# Patient Record
Sex: Female | Born: 1940 | Race: Black or African American | Hispanic: No | Smoking: Current every day smoker
Health system: Southern US, Community
[De-identification: ages and names within clinical notes are randomized; demographics above are authoritative.]

## PROBLEM LIST (undated history)

## (undated) DIAGNOSIS — I739 Peripheral vascular disease, unspecified: Secondary | ICD-10-CM

## (undated) DIAGNOSIS — I2510 Atherosclerotic heart disease of native coronary artery without angina pectoris: Secondary | ICD-10-CM

## (undated) DIAGNOSIS — E785 Hyperlipidemia, unspecified: Secondary | ICD-10-CM

## (undated) DIAGNOSIS — G459 Transient cerebral ischemic attack, unspecified: Secondary | ICD-10-CM

## (undated) DIAGNOSIS — I679 Cerebrovascular disease, unspecified: Secondary | ICD-10-CM

## (undated) DIAGNOSIS — I773 Arterial fibromuscular dysplasia: Secondary | ICD-10-CM

## (undated) DIAGNOSIS — B9681 Helicobacter pylori [H. pylori] as the cause of diseases classified elsewhere: Secondary | ICD-10-CM

## (undated) DIAGNOSIS — I442 Atrioventricular block, complete: Secondary | ICD-10-CM

## (undated) DIAGNOSIS — K589 Irritable bowel syndrome without diarrhea: Secondary | ICD-10-CM

## (undated) DIAGNOSIS — F0390 Unspecified dementia without behavioral disturbance: Secondary | ICD-10-CM

## (undated) DIAGNOSIS — K219 Gastro-esophageal reflux disease without esophagitis: Secondary | ICD-10-CM

## (undated) DIAGNOSIS — F17200 Nicotine dependence, unspecified, uncomplicated: Secondary | ICD-10-CM

## (undated) DIAGNOSIS — N183 Chronic kidney disease, stage 3 unspecified: Secondary | ICD-10-CM

## (undated) DIAGNOSIS — I6529 Occlusion and stenosis of unspecified carotid artery: Secondary | ICD-10-CM

## (undated) DIAGNOSIS — Z720 Tobacco use: Secondary | ICD-10-CM

## (undated) DIAGNOSIS — Z950 Presence of cardiac pacemaker: Secondary | ICD-10-CM

## (undated) DIAGNOSIS — I10 Essential (primary) hypertension: Secondary | ICD-10-CM

## (undated) DIAGNOSIS — J449 Chronic obstructive pulmonary disease, unspecified: Secondary | ICD-10-CM

## (undated) DIAGNOSIS — A0472 Enterocolitis due to Clostridium difficile, not specified as recurrent: Secondary | ICD-10-CM

## (undated) DIAGNOSIS — D649 Anemia, unspecified: Secondary | ICD-10-CM

## (undated) DIAGNOSIS — K279 Peptic ulcer, site unspecified, unspecified as acute or chronic, without hemorrhage or perforation: Secondary | ICD-10-CM

## (undated) DIAGNOSIS — K5790 Diverticulosis of intestine, part unspecified, without perforation or abscess without bleeding: Secondary | ICD-10-CM

## (undated) HISTORY — DX: Enterocolitis due to Clostridium difficile, not specified as recurrent: A04.72

## (undated) HISTORY — DX: Cerebrovascular disease, unspecified: I67.9

## (undated) HISTORY — DX: Atherosclerotic heart disease of native coronary artery without angina pectoris: I25.10

## (undated) HISTORY — DX: Tobacco use: Z72.0

## (undated) HISTORY — DX: Occlusion and stenosis of unspecified carotid artery: I65.29

## (undated) HISTORY — DX: Anemia, unspecified: D64.9

## (undated) HISTORY — DX: Chronic obstructive pulmonary disease, unspecified: J44.9

## (undated) HISTORY — PX: ABDOMINAL HYSTERECTOMY: SHX81

## (undated) HISTORY — DX: Hyperlipidemia, unspecified: E78.5

## (undated) HISTORY — DX: Essential (primary) hypertension: I10

## (undated) HISTORY — DX: Diverticulosis of intestine, part unspecified, without perforation or abscess without bleeding: K57.90

## (undated) HISTORY — DX: Chronic kidney disease, stage 3 unspecified: N18.30

## (undated) HISTORY — DX: Chronic kidney disease, stage 3 (moderate): N18.3

## (undated) HISTORY — DX: Arterial fibromuscular dysplasia: I77.3

## (undated) HISTORY — DX: Gastro-esophageal reflux disease without esophagitis: K21.9

## (undated) HISTORY — PX: CHOLECYSTECTOMY: SHX55

## (undated) HISTORY — DX: Nicotine dependence, unspecified, uncomplicated: F17.200

## (undated) HISTORY — DX: Peripheral vascular disease, unspecified: I73.9

## (undated) HISTORY — PX: OTHER SURGICAL HISTORY: SHX169

---

## 1977-06-05 HISTORY — PX: TOTAL ABDOMINAL HYSTERECTOMY W/ BILATERAL SALPINGOOPHORECTOMY: SHX83

## 2001-01-21 ENCOUNTER — Ambulatory Visit (HOSPITAL_COMMUNITY): Admission: RE | Admit: 2001-01-21 | Discharge: 2001-01-21 | Payer: Self-pay | Admitting: Family Medicine

## 2001-01-21 ENCOUNTER — Encounter: Payer: Self-pay | Admitting: Family Medicine

## 2001-03-22 ENCOUNTER — Encounter: Payer: Self-pay | Admitting: Family Medicine

## 2001-03-22 ENCOUNTER — Ambulatory Visit (HOSPITAL_COMMUNITY): Admission: RE | Admit: 2001-03-22 | Discharge: 2001-03-22 | Payer: Self-pay | Admitting: Family Medicine

## 2002-01-15 ENCOUNTER — Ambulatory Visit (HOSPITAL_COMMUNITY): Admission: RE | Admit: 2002-01-15 | Discharge: 2002-01-15 | Payer: Self-pay | Admitting: Family Medicine

## 2002-01-15 ENCOUNTER — Encounter: Payer: Self-pay | Admitting: Family Medicine

## 2002-01-28 ENCOUNTER — Encounter: Payer: Self-pay | Admitting: Urology

## 2002-01-28 ENCOUNTER — Ambulatory Visit (HOSPITAL_COMMUNITY): Admission: RE | Admit: 2002-01-28 | Discharge: 2002-01-28 | Payer: Self-pay | Admitting: Urology

## 2002-03-28 ENCOUNTER — Ambulatory Visit (HOSPITAL_COMMUNITY): Admission: RE | Admit: 2002-03-28 | Discharge: 2002-03-28 | Payer: Self-pay | Admitting: Family Medicine

## 2002-03-28 ENCOUNTER — Encounter: Payer: Self-pay | Admitting: Family Medicine

## 2002-05-04 ENCOUNTER — Encounter: Payer: Self-pay | Admitting: *Deleted

## 2002-05-04 ENCOUNTER — Emergency Department (HOSPITAL_COMMUNITY): Admission: EM | Admit: 2002-05-04 | Discharge: 2002-05-04 | Payer: Self-pay | Admitting: Emergency Medicine

## 2002-09-23 ENCOUNTER — Encounter: Payer: Self-pay | Admitting: Emergency Medicine

## 2002-09-23 ENCOUNTER — Inpatient Hospital Stay (HOSPITAL_COMMUNITY): Admission: EM | Admit: 2002-09-23 | Discharge: 2002-09-26 | Payer: Self-pay | Admitting: Emergency Medicine

## 2002-10-27 ENCOUNTER — Encounter (HOSPITAL_COMMUNITY): Admission: RE | Admit: 2002-10-27 | Discharge: 2002-11-26 | Payer: Self-pay | Admitting: Cardiology

## 2002-11-28 ENCOUNTER — Encounter (HOSPITAL_COMMUNITY): Admission: RE | Admit: 2002-11-28 | Discharge: 2002-12-28 | Payer: Self-pay | Admitting: Cardiology

## 2002-12-29 ENCOUNTER — Encounter (HOSPITAL_COMMUNITY): Admission: RE | Admit: 2002-12-29 | Discharge: 2003-01-28 | Payer: Self-pay | Admitting: Cardiology

## 2003-02-06 ENCOUNTER — Encounter (HOSPITAL_COMMUNITY): Admission: RE | Admit: 2003-02-06 | Discharge: 2003-03-08 | Payer: Self-pay | Admitting: Cardiology

## 2003-03-09 ENCOUNTER — Encounter (HOSPITAL_COMMUNITY): Admission: RE | Admit: 2003-03-09 | Discharge: 2003-04-08 | Payer: Self-pay | Admitting: Cardiology

## 2003-03-31 ENCOUNTER — Ambulatory Visit (HOSPITAL_COMMUNITY): Admission: RE | Admit: 2003-03-31 | Discharge: 2003-03-31 | Payer: Self-pay | Admitting: Family Medicine

## 2003-09-28 ENCOUNTER — Ambulatory Visit (HOSPITAL_COMMUNITY): Admission: RE | Admit: 2003-09-28 | Discharge: 2003-09-28 | Payer: Self-pay | Admitting: Cardiology

## 2004-04-05 ENCOUNTER — Ambulatory Visit (HOSPITAL_COMMUNITY): Admission: RE | Admit: 2004-04-05 | Discharge: 2004-04-05 | Payer: Self-pay | Admitting: Family Medicine

## 2004-07-11 ENCOUNTER — Ambulatory Visit (HOSPITAL_COMMUNITY): Admission: RE | Admit: 2004-07-11 | Discharge: 2004-07-11 | Payer: Self-pay | Admitting: Family Medicine

## 2004-08-05 ENCOUNTER — Ambulatory Visit (HOSPITAL_COMMUNITY): Admission: RE | Admit: 2004-08-05 | Discharge: 2004-08-05 | Payer: Self-pay | Admitting: General Surgery

## 2004-12-23 ENCOUNTER — Ambulatory Visit: Payer: Self-pay | Admitting: Cardiology

## 2005-03-03 ENCOUNTER — Ambulatory Visit (HOSPITAL_COMMUNITY): Admission: RE | Admit: 2005-03-03 | Discharge: 2005-03-03 | Payer: Self-pay | Admitting: Family Medicine

## 2005-04-06 ENCOUNTER — Ambulatory Visit (HOSPITAL_COMMUNITY): Admission: RE | Admit: 2005-04-06 | Discharge: 2005-04-06 | Payer: Self-pay | Admitting: Family Medicine

## 2005-11-28 ENCOUNTER — Ambulatory Visit (HOSPITAL_COMMUNITY): Admission: RE | Admit: 2005-11-28 | Discharge: 2005-11-28 | Payer: Self-pay | Admitting: Family Medicine

## 2005-12-29 ENCOUNTER — Ambulatory Visit: Payer: Self-pay | Admitting: Gastroenterology

## 2006-01-03 ENCOUNTER — Ambulatory Visit (HOSPITAL_COMMUNITY): Admission: RE | Admit: 2006-01-03 | Discharge: 2006-01-03 | Payer: Self-pay | Admitting: Gastroenterology

## 2006-01-03 ENCOUNTER — Encounter (INDEPENDENT_AMBULATORY_CARE_PROVIDER_SITE_OTHER): Payer: Self-pay | Admitting: Specialist

## 2006-01-03 ENCOUNTER — Ambulatory Visit: Payer: Self-pay | Admitting: Gastroenterology

## 2006-01-15 ENCOUNTER — Ambulatory Visit (HOSPITAL_COMMUNITY): Admission: RE | Admit: 2006-01-15 | Discharge: 2006-01-15 | Payer: Self-pay | Admitting: Internal Medicine

## 2006-03-03 ENCOUNTER — Encounter: Admission: RE | Admit: 2006-03-03 | Discharge: 2006-03-03 | Payer: Self-pay | Admitting: Family Medicine

## 2006-03-13 ENCOUNTER — Ambulatory Visit: Payer: Self-pay | Admitting: Gastroenterology

## 2006-04-09 ENCOUNTER — Ambulatory Visit (HOSPITAL_COMMUNITY): Admission: RE | Admit: 2006-04-09 | Discharge: 2006-04-09 | Payer: Self-pay | Admitting: Family Medicine

## 2006-06-08 ENCOUNTER — Ambulatory Visit: Payer: Self-pay | Admitting: Cardiology

## 2006-06-11 ENCOUNTER — Ambulatory Visit (HOSPITAL_COMMUNITY): Admission: RE | Admit: 2006-06-11 | Discharge: 2006-06-11 | Payer: Self-pay | Admitting: Cardiology

## 2006-06-25 ENCOUNTER — Ambulatory Visit: Payer: Self-pay | Admitting: Gastroenterology

## 2006-07-26 ENCOUNTER — Ambulatory Visit: Payer: Self-pay | Admitting: Gastroenterology

## 2006-08-08 ENCOUNTER — Ambulatory Visit: Payer: Self-pay | Admitting: Cardiology

## 2006-09-30 ENCOUNTER — Emergency Department (HOSPITAL_COMMUNITY): Admission: EM | Admit: 2006-09-30 | Discharge: 2006-09-30 | Payer: Self-pay | Admitting: Emergency Medicine

## 2006-11-30 ENCOUNTER — Ambulatory Visit: Payer: Self-pay | Admitting: Cardiology

## 2007-01-24 ENCOUNTER — Ambulatory Visit (HOSPITAL_COMMUNITY): Admission: RE | Admit: 2007-01-24 | Discharge: 2007-01-24 | Payer: Self-pay | Admitting: Family Medicine

## 2007-02-12 ENCOUNTER — Ambulatory Visit: Payer: Self-pay | Admitting: Vascular Surgery

## 2007-04-23 ENCOUNTER — Ambulatory Visit (HOSPITAL_COMMUNITY): Admission: RE | Admit: 2007-04-23 | Discharge: 2007-04-23 | Payer: Self-pay | Admitting: Family Medicine

## 2007-04-24 ENCOUNTER — Ambulatory Visit: Payer: Self-pay | Admitting: Cardiology

## 2007-05-01 ENCOUNTER — Ambulatory Visit (HOSPITAL_COMMUNITY): Admission: RE | Admit: 2007-05-01 | Discharge: 2007-05-01 | Payer: Self-pay | Admitting: Cardiology

## 2007-06-06 HISTORY — PX: COLONOSCOPY: SHX174

## 2007-09-20 ENCOUNTER — Ambulatory Visit (HOSPITAL_COMMUNITY): Admission: RE | Admit: 2007-09-20 | Discharge: 2007-09-20 | Payer: Self-pay | Admitting: Family Medicine

## 2007-09-25 ENCOUNTER — Ambulatory Visit (HOSPITAL_COMMUNITY): Admission: RE | Admit: 2007-09-25 | Discharge: 2007-09-25 | Payer: Self-pay | Admitting: Family Medicine

## 2007-12-17 ENCOUNTER — Ambulatory Visit: Payer: Self-pay | Admitting: Vascular Surgery

## 2008-01-03 ENCOUNTER — Ambulatory Visit: Payer: Self-pay | Admitting: Cardiology

## 2008-05-05 ENCOUNTER — Ambulatory Visit (HOSPITAL_COMMUNITY): Admission: RE | Admit: 2008-05-05 | Discharge: 2008-05-05 | Payer: Self-pay | Admitting: Family Medicine

## 2008-07-14 ENCOUNTER — Ambulatory Visit: Payer: Self-pay | Admitting: Vascular Surgery

## 2008-08-11 ENCOUNTER — Encounter: Payer: Self-pay | Admitting: Cardiology

## 2008-08-11 LAB — CONVERTED CEMR LAB
Albumin: 3.7 g/dL
BUN: 20 mg/dL
Chloride: 106 meq/L
Creatinine, Ser: 0.92 mg/dL
HDL: 36 mg/dL
LDL Cholesterol: 71 mg/dL
Potassium: 4.2 meq/L
Total Bilirubin: 0.3 mg/dL
Total Protein: 7 g/dL

## 2008-10-19 ENCOUNTER — Ambulatory Visit (HOSPITAL_COMMUNITY): Admission: RE | Admit: 2008-10-19 | Discharge: 2008-10-19 | Payer: Self-pay | Admitting: Family Medicine

## 2008-10-26 DIAGNOSIS — E119 Type 2 diabetes mellitus without complications: Secondary | ICD-10-CM

## 2008-10-26 DIAGNOSIS — I10 Essential (primary) hypertension: Secondary | ICD-10-CM

## 2008-10-26 DIAGNOSIS — K219 Gastro-esophageal reflux disease without esophagitis: Secondary | ICD-10-CM | POA: Insufficient documentation

## 2008-10-26 DIAGNOSIS — K5730 Diverticulosis of large intestine without perforation or abscess without bleeding: Secondary | ICD-10-CM | POA: Insufficient documentation

## 2008-10-26 DIAGNOSIS — M109 Gout, unspecified: Secondary | ICD-10-CM

## 2008-10-26 DIAGNOSIS — K819 Cholecystitis, unspecified: Secondary | ICD-10-CM

## 2008-10-26 DIAGNOSIS — J45909 Unspecified asthma, uncomplicated: Secondary | ICD-10-CM

## 2008-10-26 DIAGNOSIS — E785 Hyperlipidemia, unspecified: Secondary | ICD-10-CM

## 2008-10-26 DIAGNOSIS — E1140 Type 2 diabetes mellitus with diabetic neuropathy, unspecified: Secondary | ICD-10-CM | POA: Insufficient documentation

## 2008-10-27 ENCOUNTER — Encounter: Payer: Self-pay | Admitting: Cardiology

## 2008-10-27 ENCOUNTER — Ambulatory Visit: Payer: Self-pay | Admitting: Cardiology

## 2008-10-27 ENCOUNTER — Ambulatory Visit (HOSPITAL_COMMUNITY): Admission: RE | Admit: 2008-10-27 | Discharge: 2008-10-27 | Payer: Self-pay | Admitting: Family Medicine

## 2009-01-19 ENCOUNTER — Ambulatory Visit: Payer: Self-pay | Admitting: Vascular Surgery

## 2009-03-04 ENCOUNTER — Ambulatory Visit (HOSPITAL_COMMUNITY): Admission: RE | Admit: 2009-03-04 | Discharge: 2009-03-04 | Payer: Self-pay | Admitting: Family Medicine

## 2009-05-10 ENCOUNTER — Ambulatory Visit (HOSPITAL_COMMUNITY): Admission: RE | Admit: 2009-05-10 | Discharge: 2009-05-10 | Payer: Self-pay | Admitting: Family Medicine

## 2009-06-08 ENCOUNTER — Telehealth (INDEPENDENT_AMBULATORY_CARE_PROVIDER_SITE_OTHER): Payer: Self-pay | Admitting: *Deleted

## 2009-08-09 LAB — CONVERTED CEMR LAB
Alkaline Phosphatase: 72 units/L
BUN: 20 mg/dL
Bilirubin, Direct: <0.1 mg/dL
CO2: 29 meq/L
Cholesterol: 151 mg/dL
Creatinine, Ser: 0.9 mg/dL
Glucose, Bld: 105 mg/dL
Hgb A1c MFr Bld: 6.7 %
Total Protein: 7.3 g/dL
Triglycerides: 179 mg/dL

## 2009-08-16 ENCOUNTER — Encounter: Payer: Self-pay | Admitting: Cardiology

## 2009-08-16 DIAGNOSIS — I679 Cerebrovascular disease, unspecified: Secondary | ICD-10-CM

## 2009-08-16 DIAGNOSIS — I739 Peripheral vascular disease, unspecified: Secondary | ICD-10-CM

## 2009-08-16 DIAGNOSIS — F17200 Nicotine dependence, unspecified, uncomplicated: Secondary | ICD-10-CM | POA: Insufficient documentation

## 2009-08-16 DIAGNOSIS — I2510 Atherosclerotic heart disease of native coronary artery without angina pectoris: Secondary | ICD-10-CM

## 2009-08-17 ENCOUNTER — Encounter (INDEPENDENT_AMBULATORY_CARE_PROVIDER_SITE_OTHER): Payer: Self-pay | Admitting: *Deleted

## 2009-08-17 ENCOUNTER — Ambulatory Visit: Payer: Self-pay | Admitting: Cardiology

## 2009-09-10 ENCOUNTER — Telehealth: Payer: Self-pay | Admitting: Cardiology

## 2009-09-10 ENCOUNTER — Encounter: Payer: Self-pay | Admitting: Cardiology

## 2009-09-14 ENCOUNTER — Encounter (INDEPENDENT_AMBULATORY_CARE_PROVIDER_SITE_OTHER): Payer: Self-pay | Admitting: *Deleted

## 2009-09-14 LAB — CONVERTED CEMR LAB
BUN: 49 mg/dL — ABNORMAL HIGH (ref 6–23)
CO2: 25 meq/L (ref 19–32)
Chloride: 107 meq/L (ref 96–112)
Potassium: 5.2 meq/L (ref 3.5–5.3)

## 2009-11-08 ENCOUNTER — Encounter (INDEPENDENT_AMBULATORY_CARE_PROVIDER_SITE_OTHER): Payer: Self-pay

## 2009-11-08 ENCOUNTER — Ambulatory Visit (HOSPITAL_COMMUNITY): Admission: RE | Admit: 2009-11-08 | Discharge: 2009-11-08 | Payer: Self-pay | Admitting: Family Medicine

## 2009-11-08 LAB — CONVERTED CEMR LAB
CO2: 25 meq/L
Calcium: 9.2 mg/dL
Hemoglobin: 10.6 g/dL
Platelets: 295 10*3/uL
Potassium: 5.2 meq/L
Sodium: 139 meq/L

## 2009-11-15 ENCOUNTER — Encounter (INDEPENDENT_AMBULATORY_CARE_PROVIDER_SITE_OTHER): Payer: Self-pay

## 2009-11-15 ENCOUNTER — Encounter (INDEPENDENT_AMBULATORY_CARE_PROVIDER_SITE_OTHER): Payer: Self-pay | Admitting: *Deleted

## 2009-11-15 LAB — CONVERTED CEMR LAB
BUN: 43 mg/dL
CO2: 26 meq/L
Chloride: 104 meq/L
Creatinine, Ser: 1.78 mg/dL

## 2009-11-28 LAB — CONVERTED CEMR LAB
CO2: 26 meq/L (ref 19–32)
Chloride: 104 meq/L (ref 96–112)
Creatinine, Ser: 1.78 mg/dL — ABNORMAL HIGH (ref 0.40–1.20)
Glucose, Bld: 56 mg/dL — ABNORMAL LOW (ref 70–99)
Sodium: 140 meq/L (ref 135–145)

## 2009-11-30 ENCOUNTER — Encounter (INDEPENDENT_AMBULATORY_CARE_PROVIDER_SITE_OTHER): Payer: Self-pay | Admitting: *Deleted

## 2009-12-07 ENCOUNTER — Telehealth (INDEPENDENT_AMBULATORY_CARE_PROVIDER_SITE_OTHER): Payer: Self-pay | Admitting: *Deleted

## 2009-12-09 ENCOUNTER — Emergency Department (HOSPITAL_COMMUNITY): Admission: EM | Admit: 2009-12-09 | Discharge: 2009-12-09 | Payer: Self-pay | Admitting: Emergency Medicine

## 2009-12-10 ENCOUNTER — Telehealth (INDEPENDENT_AMBULATORY_CARE_PROVIDER_SITE_OTHER): Payer: Self-pay | Admitting: *Deleted

## 2009-12-13 ENCOUNTER — Encounter (INDEPENDENT_AMBULATORY_CARE_PROVIDER_SITE_OTHER): Payer: Self-pay | Admitting: *Deleted

## 2009-12-14 ENCOUNTER — Ambulatory Visit: Payer: Self-pay | Admitting: Cardiology

## 2009-12-14 DIAGNOSIS — E875 Hyperkalemia: Secondary | ICD-10-CM

## 2009-12-16 ENCOUNTER — Ambulatory Visit (HOSPITAL_COMMUNITY): Admission: RE | Admit: 2009-12-16 | Discharge: 2009-12-16 | Payer: Self-pay | Admitting: Cardiology

## 2009-12-20 ENCOUNTER — Telehealth (INDEPENDENT_AMBULATORY_CARE_PROVIDER_SITE_OTHER): Payer: Self-pay | Admitting: *Deleted

## 2010-01-05 LAB — CONVERTED CEMR LAB
CO2: 22 meq/L
Glucose, Bld: 63 mg/dL
Potassium: 3.9 meq/L
Sodium: 140 meq/L

## 2010-01-14 ENCOUNTER — Ambulatory Visit: Payer: Self-pay | Admitting: Cardiology

## 2010-02-09 ENCOUNTER — Encounter: Payer: Self-pay | Admitting: Cardiology

## 2010-02-09 ENCOUNTER — Ambulatory Visit: Payer: Self-pay | Admitting: Vascular Surgery

## 2010-02-14 ENCOUNTER — Ambulatory Visit: Payer: Self-pay | Admitting: Cardiology

## 2010-03-02 LAB — CONVERTED CEMR LAB
BUN: 11 mg/dL (ref 6–23)
CO2: 26 meq/L (ref 19–32)
Calcium: 9.2 mg/dL (ref 8.4–10.5)
Creatinine, Ser: 0.91 mg/dL (ref 0.40–1.20)
Glucose, Bld: 83 mg/dL (ref 70–99)

## 2010-03-07 ENCOUNTER — Encounter (INDEPENDENT_AMBULATORY_CARE_PROVIDER_SITE_OTHER): Payer: Self-pay | Admitting: *Deleted

## 2010-04-20 ENCOUNTER — Ambulatory Visit (HOSPITAL_COMMUNITY): Admission: RE | Admit: 2010-04-20 | Discharge: 2010-04-20 | Payer: Self-pay | Admitting: Family Medicine

## 2010-06-05 DIAGNOSIS — G459 Transient cerebral ischemic attack, unspecified: Secondary | ICD-10-CM

## 2010-06-05 HISTORY — DX: Transient cerebral ischemic attack, unspecified: G45.9

## 2010-07-05 NOTE — Letter (Signed)
Summary: [name] [facility] Work [facility], agricultural at [employer]  [address], [place] [address]   Phone: [phone]  Fax: [phone]     September 14, 2009 MRN: [id number]   Charlotte Henry [address] [place], [place]  [address]      YOUR LAB WORK IS DUE  November 15, 2009 _________________________________________  Please go to [facility], located across the street from [facility] on the second floor.  Hours are Monday - Friday 7am until 7:30pm         Saturday 8am until 12noon    __  DO NOT EAT OR DRINK AFTER MIDNIGHT EVENING PRIOR TO LABWORK  _x_ YOUR LABWORK IS NOT FASTING --YOU MAY EAT PRIOR TO LABWORK

## 2010-07-05 NOTE — Letter (Signed)
Summary: [place] Results Engineer, agricultural at [facility]  [address] [place] [address]   Phone: [phone]  Fax: [phone]      September 14, 2009 MRN: [id number]   Charlotte Henry [address] [place], [place]  [address]   Dear Ms. Samples,  Your test ordered by Selena Batten has been reviewed by your physician (or physician assistant) and was found to be normal or stable. Your physician (or physician assistant) felt no changes were needed at this time.  ____ Echocardiogram  ____ Cardiac Stress Test  _x___ Lab Work  ____ Peripheral vascular study of arms, legs or neck  ____ CT scan or X-ray  ____ Lung or Breathing test  ____ Other:  Please decrease  the amount of potassium in your diet.  Enclosed is a copy of your labwork for your records.  Thank you, Aricela Bertagnolli Allyne Gee RN    Slick Bing, MD, Lenise Arena.C.Gaylord Shih, MD, F.A.C.C Lewayne Bunting, MD, F.A.C.C Nona Dell, MD, F.A.C.C Charlton Haws, MD, Lenise Arena.C.C

## 2010-07-05 NOTE — Progress Notes (Signed)
Summary: "JUST HAVE Charlotte Henry CALL ME "  Phone Note Call from Patient Call back at Home Phone [phone]   Caller: PT Reason for Call: Talk to Nurse Summary of Call: PT WOULD LIKE [name] TO CALL HER, DID NOT GIVE REASON WHY. Initial call taken by: Faythe Ghee,  December 07, 2009 11:18 AM  Follow-up for Phone Call        [facility] Follow-up by: Teressa Lower RN,  December 07, 2009 1:08 PM  Additional Follow-up for Phone Call Additional follow up Details #1::        pt received intormation about a renal diet in the mail, very concerned about her kidney function.  Researched  renal diet better and gathered more information per pt request.  Pt to come to office in am to get this information. Additional Follow-up by: Teressa Lower RN,  December 07, 2009 5:13 PM

## 2010-07-05 NOTE — Letter (Signed)
Summary: [place] [facility] Work Engineer, agricultural at [employer]  [address] [place] [address]   Phone: [phone]  Fax: [phone]     August 17, 2009 MRN: [id number]   Charlotte Henry [address] [place], [place]  [address]      YOUR LAB WORK IS DUE   _______________MAY 15, 2011__________________________  Please go to [facility], located across the street from [facility] on the second floor.  Hours are Monday - Friday 7am until 7:30pm         Saturday 8am until 12noon    __  DO NOT EAT OR DRINK AFTER MIDNIGHT EVENING PRIOR TO LABWORK  _X_ YOUR LABWORK IS NOT FASTING --YOU MAY EAT PRIOR TO LABWORK

## 2010-07-05 NOTE — Progress Notes (Signed)
Summary: BP concerns  Phone Note Call from Patient   Caller: Patient Reason for Call: Talk to Nurse Summary of Call: pt states that BP dropped last night to 75/54 in left arm and right arm was 74/45 @ 7pm/in left arm @ 8:45 it was 104/58 and right arm was 94/58/ in left arm @ 10:30 it was 127/61 and right arm was 116/67/pt states that Diovan was increased @ last visit/pls return phone call/tg Initial call taken by: Raechel Ache [facility],  September 10, 2009 8:05 AM  Follow-up for Phone Call        S:pt's bp low 09/09/2009 B: pt seen 08/17/2009,  diovan changed to diovan/hct320/12.5 once daily and zyban 150mg   daily  A: pt had an episode 09/04/09, weakness and felt like her bp dropped but didn't check her pressure.  Had the same kind of episode 09/09/2009, bp's stated above. R: pt was in the bed when I called, asked her to call with her bp when she gets up 134/70 l arm 137/77 r arm pt  plans to take her diovan 160mg  daily until she hears from [place]. sent pt to lab for bmp 09/10/2009 Follow-up by: Teressa Lower RN,  September 10, 2009 9:37 AM  Additional Follow-up for Phone Call Additional follow up Details #1::        Change Diovan/HCT to 160/12.5 q.d. Additional Follow-up by: Kathlen Brunswick, MD, [facility],  September 14, 2009 8:59 AM    New/Updated Medications: DIOVAN HCT 160-12.5 MG TABS (VALSARTAN-HYDROCHLOROTHIAZIDE) Take 1 tablet by mouth once a day Prescriptions: DIOVAN HCT 160-12.5 MG TABS (VALSARTAN-HYDROCHLOROTHIAZIDE) Take 1 tablet by mouth once a day  #30 x 3   Entered by:   Teressa Lower RN   Authorized by:   Kathlen Brunswick, MD, [facility]   Signed by:   Teressa Lower RN on 09/14/2009   Method used:   Electronically to        [facility] (retail)       [address]       [place], [place]  [address]       Ph: [phone]       Fax: [phone]   RxID:   [id number]

## 2010-07-05 NOTE — Assessment & Plan Note (Signed)
Summary: 10 mth f/u per checkout on 10/27/08/tg   Visit Type:  Follow-up Referring Provider:  [facility]; [facility] Primary Provider:  Dr. Mirna Mires   History of Present Illness: Return visit for this very pleasant 70 year old woman with coronary disease and multiple cardiovascular risk factors.  Symptomatically, she is most concerned about pain in her right knee and leg.  She attributes this to arthritis and vascular disease, but wonders whether treatment with atorvastatin could be contributing.  She's had no chest discomfort or dyspnea.  She notes no orthopnea nor PND.  She describes no pedal edema.     Current Medications (verified): 1)  Diovan Hct 320-12.5 Mg Tabs (Valsartan-Hydrochlorothiazide) 2)  Lipitor 40 Mg Tabs (Atorvastatin Calcium) .... Take 1 Tablet By Mouth Once A Day 3)  Nexium 40 Mg Cpdr (Esomeprazole Magnesium) .... Take 1 Capsule By Mouth Once A Day 4)  Amaryl 4 Mg Tabs (Glimepiride) .... Take 1 Tablet By Mouth Once A Day 5)  Neurontin 300 Mg Caps (Gabapentin) .... Take 1 Tab Two Times A Day 6)  Calcium Plus Vitamin D 300-100 Mg-Unit Caps (Calcium Carbonate-Vitamin D) .... Take 1 Tablet By Mouth Once A Day 7)  Aspirin Ec 81 Mg Tbec (Aspirin) .... Take 1 Tablet By Mouth Once A Day 8)  Tylenol Ex St Arthritis Pain 500 Mg Tabs (Acetaminophen) .... As Needed 9)  Zyban 150 Mg Xr12h-Tab (Bupropion Hcl (Smoking Deter)) .... Take 1 Tablet By Mouth Once A Day  Allergies (verified): 1)  ! Chantix (Varenicline Tartrate) 2)  ! Aspirin 3)  ! Ace Inhibitors  Past History:  PMH, FH, and Social History reviewed and updated.  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 aortic inflow disease CHOLECYSTITIS (ICD-575.10) Anemia GOUT, UNSPECIFIED (ICD-274.9) GERD (ICD-530.81) DIVERTICULOSIS OF COLON (ICD-562.10) ASTHMA,/chronic bronchitis Tobacco abuse: 50 pack years continuing at 1/2 pack per day  Review of Systems  The patient denies anorexia, fever, weight loss, weight gain, vision loss, decreased hearing, hoarseness, chest pain, syncope, dyspnea on exertion, peripheral edema, prolonged cough, headaches, hemoptysis, abdominal pain, melena, and hematochezia.    Vital Signs:  Patient profile:   70 year old female Weight:      190 pounds BMI:     29.87 Pulse rate:   60 / minute BP sitting:   142 / 75  (right arm)  Vitals Entered By: Dreama Saa, CNA (August 17, 2009 12:51 PM)  Physical Exam  General:   Somewhat overweight, pleasant, and   matter-of-fact woman in no acute distress. Weight is 189, stable. NECK:  No jugular venous distention; no carotid bruits. CARDIAC:  Distant first and second heart sounds; modest systolic ejection murmur. ABDOMEN:  Soft and nontender; normal bowel sounds; no organomegaly. LUNGS:  Minimal inspiratory rhonchi; coarse breath sounds at bases EXTREMITIES:  No edema; distal pulses were decreased, more prominently on the right; where they were  barely palpable   Impression & Recommendations:  Problem # 1:  TOBACCO ABUSE (ICD-305.1) Patient discontinued Chantix because of adverse GI effects, but is willing to make another quit attempt.  I will prescribe Zyban.  Details of quit attempt were discussed, and she was encouraged to persist in this attempt.  Problem #  2:  ATHEROSCLEROTIC CARDIOVASCULAR DISEASE (ICD-429.2) No symptoms to suggest active myocardial ischemia.  Current therapy will be continued.  Problem # 3:  HYPERTENSION (ICD-401.9) Blood pressure control is marginal with systolics generally around 140.  Diovan will be changed to Diovan HCT and the dosage of Diovan increased.  A  chemistry profile will be obtained in one month.  Problem # 4:  HYPERLIPIDEMIA (ICD-272.4) Lipid profile was excellent when last assessed with total cholesterol 129, HDL 36, LDL of 71 and triglycerides of 112.  Unfortunately, the patient wonders whether Lipitor is contributing to pain in her legs, primarily her right leg.  She will discontinue this medication for one month.  If symptoms are improved, she will call for alternative therapy.  If not, she will resume at her current dosage.  I will plan to see this nice woman again in 8 months.  Other Orders: Future Orders: T-Basic Metabolic Panel [id number]) ... 09/17/2009  Patient Instructions: 1)  Your physician recommends that you schedule a follow-up appointment in: 8 months 2)  Your physician recommends that you return for lab work in: 1 month 3)  Your physician has recommended you make the following change in your medication: zyban 150mg  daily for 3 months and stop smoking 4)  change diovan to diovan/hct 320/12.5 daily 5)  stop lipitor x 1 month if leg pain better call office if not resume lipitor and call office. 6)  Your physician discussed the hazards of tobacco use.  Tobacco use cessation is recommended and techniques and options to help you quit were discussed. Prescriptions: ZYBAN 150 MG XR12H-TAB (BUPROPION HCL (SMOKING DETER)) Take 1 tablet by mouth once a day  #30 x 3   Entered by:   Teressa Lower RN   Authorized by:   Kathlen Brunswick, MD, [facility]   Signed by:   Teressa Lower RN on 08/17/2009   Method used:   Electronically to        [facility] (retail)       [address]       [place], [place]  [address]       Ph: [phone]       Fax: [phone]   RxID:   [id number]

## 2010-07-05 NOTE — Assessment & Plan Note (Signed)
Summary: 1 month followup/rm  Nurse Visit   Vital Signs:  Patient profile:   70 year old female Weight:      183 pounds O2 Sat:      96 % on Room air Temp:     98.6 degrees F oral Pulse rate:   66 / minute BP sitting:   119 / 63  (left arm)  Vitals Entered ByLarita Fife Via LPN (January 14, 2010 9:03 AM)  O2 Flow:  Room air  Current Medications (verified): 1)  Diovan 320 Mg Tabs (Valsartan) .... Take 1 Tablet By Mouth Once A Day 2)  Nexium 40 Mg Cpdr (Esomeprazole Magnesium) .... Take 1 Capsule By Mouth Once A Day 3)  Amaryl 4 Mg Tabs (Glimepiride) .... Take 1 Tab Daily 4)  Neurontin 300 Mg Caps (Gabapentin) .... Take 1 Tab Two Times A Day 5)  Calcium Plus Vitamin D 300-100 Mg-Unit Caps (Calcium Carbonate-Vitamin D) .... Take 1 Tablet By Mouth Once A Day 6)  Aspirin Ec 81 Mg Tbec (Aspirin) .... Take 1 Tablet By Mouth Once A Day 7)  Tylenol Ex St Arthritis Pain 500 Mg Tabs (Acetaminophen) .... As Needed 8)  Cranberry 405 Mg Caps (Cranberry) .... Take 1 Tab Daily 9)  Vitamin B-12 1000 Mcg Tabs (Cyanocobalamin) .... Take 1 Tab Daily  Allergies (verified): 1)  ! Chantix (Varenicline Tartrate) 2)  ! Aspirin 3)  ! Ace Inhibitors  Visit Type:  BP check/Nurse visit Referring Provider:  [facility]; [facility] Primary Provider:  Dr. Mirna Mires   History of Present Illness: S: Pt. arrives in office for BP check/nurse visit. B: On last OV of 12-14-09, pt. was advised to stop taking Diovan/HCT due to mild renal insufficiency and development of orthostatic hypotention, and to increase Diovan to 320mg  once daily. A: Pt. brought in medications and BP diary. No SBP > 158 or < 96, no DBP > 78 or < 58. BP at this visit is 119/63. Pt. has no complaints at this time.  R: Pt. advised to continue monitoring BP's and current medications.    01/16/2010  Needs lying and standing BP Continue current meds BMet was due on 8/12--please obtain.  [place] Bing, M.D.   Labs from [facility] on 01-05-10,  including BMET, have been entered in chart. Pt. has f/u appt. on 02-14-10. Pt. is advised of recommendations, she states she understands.

## 2010-07-05 NOTE — Progress Notes (Signed)
Summary: rx questions  Phone Note Call from Patient Call back at Home Phone [phone]   Caller: pt Reason for Call: Refill Medication Summary of Call: patient would like to speak with Faith Branan about medications Initial call taken by: Faythe Ghee,  June 08, 2009 12:03 PM  Follow-up for Phone Call        Rx Called In Follow-up by: Teressa Lower RN,  June 08, 2009 1:32 PM

## 2010-07-05 NOTE — Letter (Signed)
Summary: [place] Results Engineer, agricultural at [facility]  [address] [place] [address]   Phone: [phone]  Fax: [phone]      November 30, 2009 MRN: [id number]   Charlotte Henry [address] [place], [place]  [address]   Dear [name],  Your test ordered by Selena Batten has been reviewed by your physician (or physician assistant) and was found to be normal or stable. Your physician (or physician assistant) felt no changes were needed at this time.  ____ Echocardiogram  ____ Cardiac Stress Test  __X__ Lab Work  ____ Peripheral vascular study of arms, legs or neck  ____ CT scan or X-ray  ____ Lung or Breathing test  ____ Other:  Please follow  a renal diet with emphasis on potassium restriction Labwork  in 1 and 3 months, per Dr. Dietrich Pates. Thank you, Tammy Allyne Gee RN    [place] Bing, MD, Lenise Arena.C.Gaylord Shih, MD, F.A.C.C Lewayne Bunting, MD, F.A.C.C Nona Dell, MD, F.A.C.C Charlton Haws, MD, Lenise Arena.C.C   Appended Document: Orders Update    Clinical Lists Changes  Orders: Added new Test order of T-Basic Metabolic Panel [phone]) - Signed Added new Test order of T-Basic Metabolic Panel [phone]) - Signed  I spoke with pt gave new orders and pt verbalized understanding   Teressa Lower RN  November 30, 2009 2:48 PM

## 2010-07-05 NOTE — Miscellaneous (Signed)
Summary: labs bmp,11/15/2009  Clinical Lists Changes  Observations: Added new observation of CALCIUM: 9.4 mg/dL (62/13/0865 78:46) Added new observation of CREATININE: 1.78 mg/dL (96/29/5284 13:24) Added new observation of BUN: 43 mg/dL (40/03/2724 36:64) Added new observation of BG RANDOM: 56 mg/dL (40/34/7425 95:63) Added new observation of CO2 PLSM/SER: 26 meq/L (11/15/2009 17:14) Added new observation of CL SERUM: 104 meq/L (11/15/2009 17:14) Added new observation of K SERUM: 5.1 meq/L (11/15/2009 17:14) Added new observation of NA: 140 meq/L (11/15/2009 17:14)

## 2010-07-05 NOTE — Progress Notes (Signed)
Summary: [facility] - Office Visit  [facility] - Office Visit   Imported By: Marylou Mccoy 03/07/2010 15:22:07  _____________________________________________________________________  External Attachment:    Type:   Image     Comment:   External Document

## 2010-07-05 NOTE — Miscellaneous (Signed)
**Note De-Identified  Obfuscation** Summary: CBC and BMET  11-08-09  Clinical Lists Changes  Observations: Added new observation of CALCIUM: 9.2 mg/dL (09/81/1914 78:29) Added new observation of CREATININE: 1.40 mg/dL (56/21/3086 57:84) Added new observation of BUN: 26 mg/dL (69/62/9528 41:32) Added new observation of BG RANDOM: 53 mg/dL (44/06/270 53:66) Added new observation of CO2 PLSM/SER: 25 meq/L (11/08/2009 15:50) Added new observation of CL SERUM: 105 meq/L (11/08/2009 15:50) Added new observation of K SERUM: 5.2 meq/L (11/08/2009 15:50) Added new observation of NA: 139 meq/L (11/08/2009 15:50) Added new observation of PLATELETK/UL: 295 K/uL (11/08/2009 15:50) Added new observation of MCV: 90.9 fL (11/08/2009 15:50) Added new observation of HCT: 31.9 % (11/08/2009 15:50) Added new observation of HGB: 10.6 g/dL (44/08/4740 59:56) Added new observation of WBC COUNT: 9.7 10*3/microliter (11/08/2009 15:50)

## 2010-07-05 NOTE — Assessment & Plan Note (Signed)
Summary: per Tammy/ pt having ministrokes/tg   Visit Type:  Follow-up Referring Provider:  GI-Fields; [facility] Primary Provider:  Dr. Mirna Mires   History of Present Illness: Charlotte Henry returns to the office at the request of our emergency department after presenting there with gait instability.  She had initially been evaluated in Urgent Care where orthostatic hypotension was noted prompting referral to the Emergency Department where her vital signs were normal.  She was given intravenous hydration, and symptoms resolved.  CT Scan of the head showed small lacunar infarcts, probably old.  Laboratory in the emergency department showed anemia with a hemoglobin of 10.3 and a hematocrit of 31.  MCV was normal.  Chemistry profile was normal except for BUN of 43 and a creatinine of 1.52.  She reports no carotid ultrasound since a vascular evaluation by the surgeons in [place] some years ago.  She has had no recurrence of symptoms since her ED visit.  She reports no neurologic problems and no orthostatic dizziness.  She has not experienced syncope.  She denies chest discomfort, exertional dyspnea and edema.  Preventive Screening-Counseling & Management  Alcohol-Tobacco     Smoking Cessation Counseling: yes  Current Medications (verified): 1)  Diovan 320 Mg Tabs (Valsartan) .... Take 1 Tablet By Mouth Once A Day 2)  Nexium 40 Mg Cpdr (Esomeprazole Magnesium) .... Take 1 Capsule By Mouth Once A Day 3)  Amaryl 4 Mg Tabs (Glimepiride) .... Take 1 Tab Daily 4)  Neurontin 300 Mg Caps (Gabapentin) .... Take 1 Tab Two Times A Day 5)  Calcium Plus Vitamin D 300-100 Mg-Unit Caps (Calcium Carbonate-Vitamin D) .... Take 1 Tablet By Mouth Once A Day 6)  Aspirin Ec 81 Mg Tbec (Aspirin) .... Take 1 Tablet By Mouth Once A Day 7)  Tylenol Ex St Arthritis Pain 500 Mg Tabs (Acetaminophen) .... As Needed 8)  Cranberry 405 Mg Caps (Cranberry) .... Take 1 Tab Daily 9)  Vitamin B-12 1000 Mcg Tabs  (Cyanocobalamin) .... Take 1 Tab Daily  Allergies (verified): 1)  ! Chantix (Varenicline Tartrate) 2)  ! Aspirin 3)  ! Ace Inhibitors  Past History:  PMH, FH, and Social History reviewed and updated.  Past Medical History: ASCVD: Negative stress nuclear in 3/99; cath in 4/04-80% LAD, 70% RCA, both treated with DES;       residual 60% distal RCA; normal ejection fraction HYPERLIPIDEMIA (ICD-272.4) HYPERTENSION (ICD-401.9) Chronic kidney disease-stage III; creatinine of 1.78 in 11/2009 Hyperkalemia-potassium of 5.1-5.3 in 2011 AODM (ICD-250.00)-treated with oral agents; hemoglobin A1c of 7 and 3/10 PVD/CVD-50% LICA in 2000 and; moderate to severely decreased ABIs in 8/08; sig. aortic inflow disease CHOLECYSTITIS (ICD-575.10) Anemia GOUT, UNSPECIFIED (ICD-274.9) GERD (ICD-530.81) DIVERTICULOSIS OF COLON (ICD-562.10) ASTHMA,/chronic bronchitis Tobacco abuse: 50 pack years continuing at 1/2 pack per day   Review of Systems       See history of present illness.  Vital Signs:  Patient profile:   70 year old female Weight:      187 pounds Pulse rate:   60 / minute BP sitting:   108 / 69  (right arm)  Vitals Entered By: Charlotte Saa, CNA (December 14, 2009 10:44 AM)  Physical Exam  General:   Somewhat overweight, pleasant,  in no acute distress. Weight is 187, stable. NECK:  No jugular venous distention; questionable minimal left carotid bruits. CARDIAC: normal first and second heart sounds; modest systolic ejection murmur. ABDOMEN:  Soft and nontender; normal bowel sounds; no organomegaly. LUNGS:  Minimal inspiratory rhonchi; coarse  breath sounds at bases EXTREMITIES:  No edema; distal pulses were 1-2+   Impression & Recommendations:  Problem # 1:  ATHEROSCLEROTIC CARDIOVASCULAR DISEASE (ICD-429.2) Patient remains asymptomatic with respect to cardiac issues.  Problem # 2:  HYPERTENSION (ICD-401.9) Blood pressure control remains adequate.  The patient developed apparent  orthostatic hypotension in the setting of a single episode of emesis with an acute GI illness that resolved spontaneously.  She did not appear to have vertigo and did not take the medication prescribed for that entity.  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  Although recent symptoms were probably not related to neurologic disease, and infarcts are likely secondary to hypertension, a carotid ultrasound study will be performed.  Patient is cautioned to maintain her fluid and salt intake and to call should she develop recurrent symptoms  Problem # 3:  CHRONIC KIDNEY DISEASE -STAGE III (ICD-585.9) Renal dysfunction has been stable with creatinines of 1.4-1.8.  In light of her mild renal insufficiency and development of orthostatic hypotension, diuretic will be discontinued and valsartan dosage increased to 320 mg q.d.  Blood pressure will be followed at home and by the cardiology nurses.  Renal function will be reassessed in 1 and 4 months.  Problem # 4:  TOBACCO ABUSE (ICD-305.1) Patient reports GI symptoms and anxiety developing when she tried Zyban.  We recommend another attempt to quit smoking with nicotine replacement and referral to a hospital-based  smoking cessation program.     Problem # 5:  HYPERKALEMIA (ICD-276.7) Potassium values have been the upper limit of normal except for the recent determination in the emergency department.  A low potassium renal diet will be recommended.  I will plan to reassess this nice woman in 2 months.  Other Orders: T-CBC w/Diff ([id number]) T-Comprehensive Metabolic Panel [id number]) Carotid Duplex (Carotid Duplex)  Patient Instructions: 1)  Your physician recommends that you schedule a follow-up appointment in: 2 months 2)  Your physician recommends that you return for lab work [id number] 3)  Your physician has recommended you make the following change in your medication: stop zyban, change diovan 4)  /hct to  diovan 320mg  daily 5)  You have been referred to nurse visit in 1 month, please bring bp diary to nurse visit with your medications 6)  Your physician has requested that you decrease the amount of potassium in your diet. Please see [facility] handout. 7)  Your physician has requested that you have a carotid duplex. This test is an ultrasound of the carotid arteries in your neck. It looks at blood flow through these arteries that supply the brain with blood. Allow one hour for this exam. There are no restrictions or special instructions. 8)  Your physician has requested that you regularly monitor and record your blood pressure readings at home.  Please use the same machine at the same time of day to check your readings and record them to bring to your follow-up visit. 9)  Your physician discussed the hazards of tobacco use.  Tobacco use cessation is recommended and techniques and options to help you quit were discussed. please use nictoine patches over the counter  10)  Please call Diane  at [phone] to set up smoking cessation classes at [facility] Prescriptions: DIOVAN 320 MG TABS (VALSARTAN) Take 1 tablet by mouth once a day  #30 x 3   Entered by:   Teressa Lower RN   Authorized by:   Kathlen Brunswick, MD, [facility]   Signed  by:   Teressa Lower RN on 12/14/2009   Method used:   Electronically to        [facility]* (retail)       [address]       [place], [place]  [address]       Ph: [phone]       Fax: [phone]   RxID:   [id number]   Prevention & Chronic Care Immunizations   Influenza vaccine: Not documented    Tetanus booster: Not documented    Pneumococcal vaccine: Not documented    H. zoster vaccine: Not documented  Colorectal Screening   Hemoccult: Not documented    Colonoscopy: Not documented  Other Screening   Pap smear: Not documented    Mammogram: Not documented    DXA bone density scan: Not documented   Smoking status:  current  (10/26/2008)   Smoking cessation counseling: yes  (12/14/2009)    Screening comments: patient smokes 1/2 pack daily gave zyban stopped taking  Diabetes Mellitus   HgbA1C: 6.7  (08/09/2009)    Eye exam: Not documented    Foot exam: Not documented   High risk foot: Not documented   Foot care education: Not documented    Urine microalbumin/creatinine ratio: Not documented  Lipids   Total Cholesterol: 151  (08/09/2009)   LDL: 77  (08/09/2009)   LDL Direct: Not documented   HDL: 38  (08/09/2009)   Triglycerides: 179  (08/09/2009)    SGOT (AST): 17  (08/09/2009)   SGPT (ALT): 14  (08/09/2009) CMP ordered    Alkaline phosphatase: 72  (08/09/2009)   Total bilirubin: 0.3  (08/11/2008)  Hypertension   Last Blood Pressure: 108 / 69  (12/14/2009)   Serum creatinine: 1.78  (11/15/2009)   Serum potassium 5.1  (11/15/2009) CMP ordered   Self-Management Support :    Diabetes self-management support: Not documented    Hypertension self-management support: Not documented    Lipid self-management support: Not documented

## 2010-07-05 NOTE — Letter (Signed)
Summary: [employer] Engineer, agricultural at [facility]  [address], [place] [address]   Phone: [phone]  Fax: [phone]      March 07, 2010 MRN: [id number]   Charlotte Henry [address] [place], [place]  [address]   Dear [name],  Your test ordered by Selena Batten has been reviewed by your physician (or physician assistant) and was found to be normal or stable. Your physician (or physician assistant) felt no changes were needed at this time.  ____ Echocardiogram  ____ Cardiac Stress Test  __x__ Lab Work  ____ Peripheral vascular study of arms, legs or neck  ____ CT scan or X-ray  ____ Lung or Breathing test  ____ Other:  No change in medical treatment at this time, per Dr. Dietrich Pates.  Enclosed is a copy of your labwork for your records.  Thank you, Tammy Allyne Gee RN    [place] Bing, MD, Lenise Arena.C.Gaylord Shih, MD, F.A.C.C Lewayne Bunting, MD, F.A.C.C Nona Dell, MD, F.A.C.C Charlton Haws, MD, Lenise Arena.C.C

## 2010-07-05 NOTE — Assessment & Plan Note (Signed)
Summary: 2 month followup/rm   Visit Type:  Follow-up Referring Provider:  GI-Fields; [facility] Primary Provider:  Dr. Mirna Mires   History of Present Illness: [name] returns to the office as scheduled for continued assessment and treatment of coronary disease, peripheral vascular disease and cardiovascular risk factors.  Since her last visit, she has done well.  She denies chest discomfort or dyspnea.  She is able to do housework without difficulty.  Unfortunately, she continues to smoke cigarettes at the rate of at least one half pack per day.  Serious new personal issues including the death of a sister-in-law, have prevented her from attending smoking cessation classes.   Current Medications (verified): 1)  Diovan 320 Mg Tabs (Valsartan) .... Take 1 Tablet By Mouth Once A Day 2)  Nexium 40 Mg Cpdr (Esomeprazole Magnesium) .... Take 1 Capsule By Mouth Once A Day 3)  Amaryl 4 Mg Tabs (Glimepiride) .... Take 1 and 1/2 Tab Daily 4)  Neurontin 300 Mg Caps (Gabapentin) .... Take 2 Tab Two Times A Day 5)  Calcium Plus Vitamin D 300-100 Mg-Unit Caps (Calcium Carbonate-Vitamin D) .... Take 1 Tablet By Mouth Once A Day 6)  Aspirin Ec 81 Mg Tbec (Aspirin) .... Take 1 Tablet By Mouth Once A Day 7)  Tylenol Ex St Arthritis Pain 500 Mg Tabs (Acetaminophen) .... As Needed 8)  Cranberry 405 Mg Caps (Cranberry) .... Take 1 Tab Daily 9)  Vitamin B-12 1000 Mcg Tabs (Cyanocobalamin) .... Take 1 Tab Daily 10)  Lipitor 40 Mg Tabs (Atorvastatin Calcium) .... Take 1 Tab Dialy 11)  Pletal 100 Mg Tabs (Cilostazol) .... Take 1 Tab Two Times A Day 12)  Ferrous Sulfate 325 (65 Fe) Mg Tabs (Ferrous Sulfate) .... Take 1 Tab Daily  Allergies (verified): 1)  ! Chantix (Varenicline Tartrate) 2)  ! Aspirin 3)  ! Ace Inhibitors  Past History:  PMH, FH, and Social History reviewed and updated.  Past Medical History: ASCVD: Negative stress nuclear in 3/99; cath in 4/04-80% LAD, 70% RCA, both treated  with DES;       residual 60% distal RCA; normal ejection fraction HYPERLIPIDEMIA (ICD-272.4) HYPERTENSION (ICD-401.9) Chronic kidney disease-stage III; creatinine of 1.78 in 11/2009 Hyperkalemia-potassium of 5.1-5.3 in 2011 AODM (ICD-250.00)-treated with oral agents; hemoglobin A1c of 7 and 3/10 PVD/CVD-50% LICA in 2000 and; moderate to severely decreased ABIs in 8/08; sig. aortic inflow disease;       repeat ABIs in 2011 improved- mild disease on the left and moderate to severe disease on the right CHOLECYSTITIS (ICD-575.10) Anemia GOUT, UNSPECIFIED (ICD-274.9) GERD (ICD-530.81) DIVERTICULOSIS OF COLON (ICD-562.10) ASTHMA,/chronic bronchitis Tobacco abuse: 50 pack years continuing at 1/2 pack per day  Review of Systems  The patient denies weight loss, weight gain, hoarseness, chest pain, syncope, dyspnea on exertion, peripheral edema, prolonged cough, headaches, and hemoptysis.    Vital Signs:  Patient profile:   70 year old female Weight:      185 pounds BMI:     29.08 Pulse rate:   59 / minute BP sitting:   122 / 67  (right arm)  Vitals Entered By: Dreama Saa, CNA (February 14, 2010 2:50 PM)  Physical Exam  General:   Somewhat overweight, pleasant,  in no acute distress. Weight is 185, 2 pounds less than last office visit NECK:  No jugular venous distention; no carotid bruits appreciated CARDIAC: distant first and second heart sounds; modest systolic ejection murmur. ABDOMEN:  Soft and nontender; normal bowel sounds; no organomegaly. LUNGS:  decreased breath sounds; minimal inspiratory and expiratory rhonchi; few left basilar rales EXTREMITIES:  No edema; distal pulses were 0-1+   Impression & Recommendations:  Problem # 1:  ATHEROSCLEROTIC CARDIOVASCULAR DISEASE (ICD-429.2) Patient is currently asymptomatic.  Our focus will be optimal control of vascular risk factors.  Problem # 2:  HYPERTENSION (ICD-401.9) Blood pressure has been excellent; current medications  will be continued.  Problem # 3:  HYPERLIPIDEMIA (ICD-272.4) Lipid profile 6 months ago was excellent; current therapy will be continued.  CHOL: 151 (08/09/2009)   LDL: 77 (08/09/2009)   HDL: 38 (08/09/2009)   TG: 179 (08/09/2009)  Problem # 4:  CHRONIC KIDNEY DISEASE -STAGE III (ICD-585.9)  BUN and creatinine were normal last month indicating a modest and nonprogressive disease process to date.  Problem # 5:  PVD (ICD-443.9)  She was recently seen by Dr. Edilia Bo, who felt that her vascular status was stable.  ABIs have improved.  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  Dr. Edilia Bo suggested that she start Plendil, which she has not yet done, but has purchased the medication.  I suggested that she tried for a month.  Since she does not have classic claudication, I doubt that she will derive much benefit.  Problem # 6:  CEREBROVASCULAR DISEASE (ICD-437.9) Carotid duplex studies performed in July showed no progression of disease and no significant focal stenosis.  There are moderate atherosclerotic changes bilaterally.  Problem # 7:  TOBACCO ABUSE (ICD-305.1) Patient will be re-referred to smoking cessation and given the number for the state quitline.  I will plan to see this nice woman again in 6 months.  Patient Instructions: 1)  Your physician recommends that you schedule a follow-up appointment in: 6 months 2)  Your physician has recommended you make the following change in your medication: to take pletal x 1 month ; if no benefit discontinue 3)  Your physician discussed the hazards of tobacco use.  Tobacco use cessation is recommended and techniques and options to help you quit were discussed.

## 2010-07-05 NOTE — Progress Notes (Signed)
Summary: Results  Phone Note Call from Patient   Caller: Patient Reason for Call: Lab or Test Results Summary of Call: pt would like results of dopplers done on 12/16/09/tg Initial call taken by: Raechel Ache [facility],  December 20, 2009 11:13 AM  Follow-up for Phone Call        Phone Call Completed results of carotid ultrsound given  Follow-up by: Teressa Lower RN,  December 20, 2009 12:23 PM

## 2010-07-05 NOTE — Progress Notes (Signed)
Summary: PT HAVING MINI STROKES AND HAS SOME QUESTIONS  Phone Note Call from Patient Call back at Home Phone [phone]   Reason for Call: Talk to Nurse Summary of Call: PT WAS SEEN AT ED YESTERDAY AND TOLD SHE WAS HAVING MINI STROKES. SHE WOULD LIKE TO ASK Anurag Scarfo SOME QUESTIONS. Initial call taken by: Faythe Ghee,  December 10, 2009 9:53 AM  Follow-up for Phone Call        Pt will be seeing Dr. Loleta Chance on Monday 12/13/09.  Will try to get pt in to see Dr. Dietrich Pates within the next 2 weeks Follow-up by: Teressa Lower RN,  December 10, 2009 3:20 PM

## 2010-08-21 LAB — BASIC METABOLIC PANEL
CO2: 25 mEq/L (ref 19–32)
Chloride: 109 mEq/L (ref 96–112)
Creatinine, Ser: 1.52 mg/dL — ABNORMAL HIGH (ref 0.4–1.2)
GFR calc Af Amer: 41 mL/min — ABNORMAL LOW (ref >60)

## 2010-08-21 LAB — CBC
Hemoglobin: 10.6 g/dL — ABNORMAL LOW (ref 12.0–15.0)
MCH: 30.9 pg (ref 26.0–34.0)
MCV: 91.6 fL (ref 78.0–100.0)
Platelets: 289 10*3/uL (ref 150–400)
RBC: 3.42 MIL/uL — ABNORMAL LOW (ref 3.87–5.11)

## 2010-08-21 LAB — DIFFERENTIAL
Eosinophils Absolute: 0.2 10*3/uL (ref 0.0–0.7)
Eosinophils Relative: 2 % (ref 0–5)
Lymphs Abs: 3 10*3/uL (ref 0.7–4.0)
Monocytes Absolute: 0.8 10*3/uL (ref 0.1–1.0)
Monocytes Relative: 10 % (ref 3–12)

## 2010-08-25 ENCOUNTER — Ambulatory Visit (INDEPENDENT_AMBULATORY_CARE_PROVIDER_SITE_OTHER): Payer: Medicare Other | Admitting: Cardiology

## 2010-08-25 ENCOUNTER — Encounter: Payer: Self-pay | Admitting: Cardiology

## 2010-08-25 VITALS — BP 111/64 | HR 64 | Ht 67.0 in | Wt 174.0 lb

## 2010-08-25 DIAGNOSIS — N189 Chronic kidney disease, unspecified: Secondary | ICD-10-CM | POA: Insufficient documentation

## 2010-08-25 DIAGNOSIS — F17200 Nicotine dependence, unspecified, uncomplicated: Secondary | ICD-10-CM

## 2010-08-25 DIAGNOSIS — E782 Mixed hyperlipidemia: Secondary | ICD-10-CM

## 2010-08-25 DIAGNOSIS — N183 Chronic kidney disease, stage 3 unspecified: Secondary | ICD-10-CM

## 2010-08-25 DIAGNOSIS — E785 Hyperlipidemia, unspecified: Secondary | ICD-10-CM

## 2010-08-25 DIAGNOSIS — I679 Cerebrovascular disease, unspecified: Secondary | ICD-10-CM

## 2010-08-25 DIAGNOSIS — I2510 Atherosclerotic heart disease of native coronary artery without angina pectoris: Secondary | ICD-10-CM

## 2010-08-25 DIAGNOSIS — I739 Peripheral vascular disease, unspecified: Secondary | ICD-10-CM

## 2010-08-25 MED ORDER — PRAVASTATIN SODIUM 20 MG PO TABS: 20.0000 mg | ORAL_TABLET | Freq: Every evening | ORAL | Status: DC

## 2010-08-25 NOTE — Patient Instructions (Signed)
Your physician recommends that you schedule a follow-up appointment in: follow up with orthopedics if no better within 1 month Your physician has recommended you make the following change in your medication: begin pravastatin 40mg  dialy, take aleve 1 twice daily as needed for arthritis Your physician recommends that you return for lab work in: 1 month

## 2010-08-25 NOTE — Progress Notes (Signed)
HPI : Patient has done generally well from a cardiovascular standpoint.  She experiences paresthesias and pain in her hands and feet, but has had no claudication.  The symptoms have improved since she discontinued atorvastatin and cilostazol.  She has had no chest discomfort, dyspnea, orthopnea, pedal edema or syncope.  Unfortunately, she continues to smoke cigarettes.  Current Outpatient Prescriptions on File Prior to Visit  Medication Sig Dispense Refill  . acetaminophen (TYLENOL EX ST ARTHRITIS PAIN) 500 MG tablet Take 500 mg by mouth every 6 (six) hours as needed.        Marland Kitchen aspirin 81 MG tablet Take 81 mg by mouth daily.        . calcium citrate-vitamin D (CITRACAL+D) 315-200 MG-UNIT per tablet Take 1 tablet by mouth daily.        . Cranberry 405 MG CAPS Take 1 capsule by mouth daily.        Marland Kitchen esomeprazole (NEXIUM) 40 MG capsule Take 40 mg by mouth daily.       . ferrous sulfate 325 (65 FE) MG tablet Take 325 mg by mouth daily with breakfast.        . gabapentin (NEURONTIN) 300 MG capsule Take 300 mg by mouth daily. Take 2 tablets at 10am and 5pm          . glimepiride (AMARYL) 4 MG tablet Take 4 mg by mouth daily before breakfast. Take 1 1/2 tablets      . valsartan (DIOVAN) 320 MG tablet Take 320 mg by mouth daily.        . vitamin B-12 (CYANOCOBALAMIN) 1000 MCG tablet Take 1,000 mcg by mouth daily.        Marland Kitchen DISCONTD: atorvastatin (LIPITOR) 40 MG tablet Take 40 mg by mouth daily.        Marland Kitchen DISCONTD: cilostazol (PLETAL) 100 MG tablet Take 100 mg by mouth 2 (two) times daily.           Allergies  Allergen Reactions  . Ace Inhibitors   . Aspirin   . Varenicline Tartrate       Past medical history, social history, and family history reviewed and updated.  ROS:  General:  +anorexia, resulting in weigh loss. Cardiac: no chest pain, dyspnea, orthopnea, PND,  or syncope Respiratory: no cough, sputum production or hemoptysis GI: no nausea, abdominal pain, emesis, diarrhea or  constipation Integument: no significant lesions Neurologic: No muscle weakness or paralysis; no speech disturbance; no headache  BP 111/64  Pulse 64  Ht 5\' 7"  (1.702 m)  Wt 174 lb (78.926 kg)  BMI 27.25 kg/m2  SpO2 92%  PHYSICAL EXAM: General-Well developed; no acute distress Body habitus-proportionate weight and height Neck-No JVD; no carotid bruits Lungs-few basilar rales; resonant to percussion Cardiovascular-normal PMI; normal S1 and S2; minimal basilar systolic ejection murmur Abdomen-normal bowel sounds; soft and non-tender without masses or organomegaly Musculoskeletal-No deformities, no cyanosis or clubbing Neurologic-Normal cranial nerves; symmetric strength and tone Skin-Warm, no significant lesions Extremities-moderately to markedly decreased distal pulses; adequate soft tissue perfusion; no edema  ASSESSMENT AND PLAN:

## 2010-08-25 NOTE — Assessment & Plan Note (Signed)
Pravastatin 20 mg q.d. will be substituted for a atorvastatin with a repeat lipid profile in one month.  I anticipate that an upwards dose adjustment will be required.

## 2010-08-25 NOTE — Assessment & Plan Note (Signed)
The importance of discontinuing tobacco use was stressed to the patient.  She has attempted to use both nicotine patches and Chantix to assist with smoking cessation, but suffered serious side effects for both medications.  She will attempt to stop on her own and consider use of Wellbutrin should that fail.

## 2010-08-25 NOTE — Assessment & Plan Note (Signed)
ABIs in 02/2010 demonstrated moderate to severe disease, worse in the right leg.  In the absence of symptoms, no action is warranted other than discontinuation of smoking.

## 2010-08-25 NOTE — Assessment & Plan Note (Signed)
Patient is doing very well symptomatically with respect to coronary disease.  We will continue to attempt to control cardiovascular risk factors optimally.  Unfortunately, continued cigarette smoking in the face of diabetes and peripheral vascular disease conveys substantial risk.

## 2010-08-30 ENCOUNTER — Encounter: Payer: Self-pay | Admitting: Cardiology

## 2010-09-24 ENCOUNTER — Other Ambulatory Visit: Payer: Self-pay | Admitting: Cardiology

## 2010-09-24 LAB — COMPREHENSIVE METABOLIC PANEL
Alkaline Phosphatase: 78 U/L (ref 39–117)
BUN: 17 mg/dL (ref 6–23)
Creat: 0.96 mg/dL (ref 0.40–1.20)
Glucose, Bld: 73 mg/dL (ref 70–99)
Total Bilirubin: 0.5 mg/dL (ref 0.3–1.2)

## 2010-09-24 LAB — LIPID PANEL
HDL: 33 mg/dL — ABNORMAL LOW (ref >39)
LDL Cholesterol: 111 mg/dL — ABNORMAL HIGH (ref 0–99)
Triglycerides: 133 mg/dL (ref <150)
VLDL: 27 mg/dL (ref 0–40)

## 2010-09-27 ENCOUNTER — Telehealth: Payer: Self-pay | Admitting: Cardiology

## 2010-09-27 NOTE — Telephone Encounter (Signed)
Patient would like copy of labwork results from Saturday 09/24/10 sent to her PCP, Dr.Gerald HIll / tg

## 2010-10-08 ENCOUNTER — Other Ambulatory Visit: Payer: Self-pay | Admitting: Cardiology

## 2010-10-11 ENCOUNTER — Encounter: Payer: Self-pay | Admitting: *Deleted

## 2010-10-11 NOTE — Progress Notes (Signed)
Result letter to pt.

## 2010-10-18 NOTE — Letter (Signed)
November 30, 2006    Charlotte Henry. Charlotte Chance, MD  [facility]  [address], [address]  [place], [place] [address]   RE:  Charlotte Henry, Charlotte Henry  MRN:  [id number]  /  DOB:  03-19-1941   Dear Charlotte Henry:   Charlotte Henry returns to the office for continued assessment and treatment  of coronary disease and cardiovascular risk factors.  Since her last  visit she has done fairly well.  She was in the emergency department  only for a fall when she missed a step and sprained her left ankle.  This is much improved.  She has had no dyspnea, chest pain, orthopnea,  nor PND.   CURRENT MEDICATIONS:  1. Tylenol Arthritis 500 mg daily.  2. Diovan 160 mg daily.  3. Amaryl 4 mg daily.  4. Nexium 40 mg daily.  5. Simvastatin 80 mg daily.   PHYSICAL EXAMINATION:  A pleasant woman in no acute distress.  The weight is 199, 3 pounds more than in March.  Blood pressure 150/80,  heart rate is 72 and regular.  Respirations 16.  NECK:  No jugular venous distension.  Normal carotid upstrokes without  bruits.  LUNGS:  Minor inspiratory and expiratory rhonchi.  Minor expiratory  wheeze.  CARDIAC:  Normal 1st and 2nd heart sounds, modest basilar systolic  murmur.  ABDOMEN:  Soft and nontender.  No organomegaly.  EXTREMITIES:  1/2+ ankle edema on the left, distal pulses intact.   Most recent lipid profile is somewhat suboptimal with a total  cholesterol of 196, triglycerides of 170, HDL of 51, and LDL of 111.   LFTs are normal.   IMPRESSION:  Charlotte Henry is doing generally well.  Since she has tolerated  Ezetimibe in the past and has a degree of constipation that might be  exacerbated by a resin, we will change her current lipid-lowering  therapy to Vytorin 10/40 mg daily.  A lipid profile will be reassessed  in 2 months.  Blood pressure control is borderline.  The patient will  follow values and perhaps require an additional agent.  She suffered  adverse GI effects to Chantix and could not take that medication.  She  claims to have tapered cigarette consumption to 2 cigarettes per day.  Nonetheless, she has findings on exam and is encouraged to stop tobacco  products entirely.  I will reassess this nice woman again in 5 months.    Sincerely,      Charlotte Friends. Dietrich Pates, MD, [facility]  Electronically Signed    RMR/MedQ  DD: 11/30/2006  DT: 11/30/2006  Job #: [id number]

## 2010-10-18 NOTE — Procedures (Signed)
DUPLEX DEEP VENOUS EXAM - LOWER EXTREMITY   INDICATION:  Right lower extremity pain in calf.   HISTORY:  Edema:  No.  Trauma/Surgery:  No.  Pain:  Yes, right lower extremity.  PE:  No.  Previous DVT:  No.  Anticoagulants:  No.    DUPLEX EXAM:                CFV   SFV   PopV  PTV    GSV                R  L  R  L  R  L  R   L  R  L  Thrombosis    0  0  0     0     0      0  Spontaneous   +  +  +     +     +      +  Phasic        +  +  +     +     +      +  Augmentation  +  +  +     +     +      +  Compressible  +  +  +     +     +      +  Competent     +  +  +     +     +      +   Legend:  + - yes  o - no  p - partial  D - decreased    IMPRESSION:  1. The right lower extremity deep venous system was imaged, dopplered      and shows no evidence of DVT.  2. The contralateral common femoral vein shows no evidence of DVT.  3. Incidental note:  Bilateral SFA stenosis (possible occlusion) with      monophasic flow in bilateral calves.    _____________________________  Di Kindle. Edilia Bo, M.D.   AS/MEDQ  D:  02/12/2007  T:  02/13/2007  Job:  [id number]

## 2010-10-18 NOTE — Assessment & Plan Note (Signed)
OFFICE VISIT   Charlotte Henry, Charlotte Henry  DOB:  1941-05-26                                       02/09/2010  BMWUX#:[id number]   I saw this patient in the office today for continued followup of her  peripheral vascular disease.  I had last seen her in August 2010 at  which time she had stable claudication with an ABI of 38% on the right  and 63% on the left.  We were treating this conservatively.  She comes  in for her yearly followup visit.  Since I saw her last,  she continues  to have pain in both legs associated with ambulation and relieved with  rest.  Her symptoms have gradually increased since I saw her a year ago.  The symptoms are more significant on the right side.  The pain is  brought on by ambulation and relieved with rest.  There were no other  aggravating or alleviating factors.  The pain is moderate in severity.  She has had no rest pain or history of nonhealing wounds.  She does have  a history of neuropathy.   PAST MEDICAL HISTORY:  Significant for diabetes, hypertension,  hypercholesterolemia, congestive heart failure, COPD.   SOCIAL HISTORY:  She is widowed.  She smokes 5 to 6 cigarettes a day.   REVIEW OF SYSTEMS:  CARDIOVASCULAR:  She has had no chest pain, chest  pressure, palpitations or arrhythmias.  She does admit to dyspnea on  exertion and also orthopnea.  She had a mini stroke in August 2011.  She  states that she has had previous carotid duplex scans done at Dr.  Marvel Plan office which were unremarkable.  PULMONARY:  She does have a history of bronchitis, asthma, and wheezing.  HEMATOLOGIC:  She has a history of anemia.   PHYSICAL EXAMINATION:  This is a pleasant 70-year-old woman who appears  her stated age.  Blood pressure 122/68, heart rate is 68, saturation  99%.  HEENT is unremarkable.  Lungs are clear bilaterally to  auscultation without rales, rhonchi, or wheezing.  On cardiovascular  exam, I do not detect any carotid bruits.   She has a regular rate and  rhythm.  She has palpable femoral pulses and a weakly palpable left  dorsalis pedis pulse.  I cannot palpate a posterior tibial pulse on the  left and I cannot palpate pedal pulses on the right side.  Abdomen is  soft and nontender with normal-pitched bowel sounds.  Musculoskeletal  exam has no major deformities or cyanosis.  Neurologic exam:  She has no  focal weakness or paresthesias.   I did independently interpret her arterial Doppler study today which  shows monophasic Doppler signals in the posterior tibial and anterior  tibial positions on the right.  On the left side, she has biphasic  signals in the anterior tibial and posterior tibial positions.  ABI  today is 45% on the right which was actually improved compared to a year  ago when it was 38%.  On the left side, she is 74% which is also  increased compared 63% a year ago.   Given that the patient has no rest pain and her claudication symptoms  are tolerable, we have again decided to continue with a conservative  approach.  We have discussed again the importance of tobacco cessation.  We also discussed  the importance of a structured walking program.  Finally, we discussed the use of cilostazol and she would like to try  this.  So, I have written a prescription for cilostazol 100 mg p.o.  b.i.d.  I plan on seeing her back in 1 year.  She knows to call sooner  if she has problems.     Di Kindle. Edilia Bo, M.D.  Electronically Signed   CSD/MEDQ  D:  02/09/2010  T:  02/10/2010  Job:  [id number]   cc:   Annia Friendly. Loleta Chance, MD  Gerrit Friends. Dietrich Pates, MD, [facility]

## 2010-10-18 NOTE — Letter (Signed)
Oct 27, 2008    Charlotte Henry. Loleta Chance, MD  [address]  [place], [place] [address]   RE:  Charlotte Henry  MRN:  [id number]  /  DOB:  [birth year]   Dr. Earvin Hansen:   [name] returns to the office for continuing assessment and treatment  of coronary artery disease, peripheral vascular disease, cerebrovascular  disease, and multiple vascular risk factors.  Since her last visit, she  has done quite well.  Her most serious medical problem was a 3-day  illness with symptoms suggesting a viral syndrome.  She has not required  hospitalization or treatment in the emergency department.  Blood  pressure control has been good as has been control of diabetes.  A  recent lipid profile in your office was excellent with total cholesterol  of 112, HDL of 36, low triglycerides, and LDL of 71.  Hemoglobin A1c  level was 7.0.   She has had no chest pain nor exertional dyspnea.  Lifestyle is  sedentary.  She has severe peripheral vascular disease by duplex  studies, but does not really note claudication.  Unfortunately, she  continues to smoke cigarettes at a rate of one-half pack per day.  She  was recently found to have mild anemia with hemoglobin of 11 and a  normal MCV.   Current medications are unchanged from her last visit.   PHYSICAL EXAMINATION:  GENERAL:  Somewhat overweight, pleasant, and  matter-of-fact woman in no acute distress.  VITAL SIGNS:  The weight is 189, 8 pounds less than last July.  Blood  pressure is 120/70, heart rate is 60 and regular, respirations 14 and  unlabored.  NECK:  No jugular venous distention; no carotid bruits.  CARDIAC:  Normal first and second heart sounds; modest systolic ejection  murmur.  ABDOMEN:  Soft and nontender; normal bowel sounds; no organomegaly.  LUNGS:  Minimal inspiratory rhonchi; normal expiratory phase.  EXTREMITIES:  No edema; distal pulses were decreased, but palpable.   IMPRESSION:  Charlotte Henry is doing well with controllable  cardiovascular  risk factors except for cigarette smoking.  Lipid profile is ideal.  Control of diabetes is per guidelines.  Blood pressure control is  excellent.  We discussed the use of nicotine substitution and another  attempt at quitting smoking.  She indicates she is willing to do this  and will choose nicotine gum.  Peripheral vascular studies were reviewed  on line.  She has severe disease on the right and moderate disease on  the left.  She was seen by Dr. Edilia Bo a few months ago, who told her  that her vascular disease was nonprogressive and did not require any  surgical or percutaneous intervention at present.  I will see this nice  woman again in 10 months.    Sincerely,      Gerrit Friends. Dietrich Pates, MD, [facility]  Electronically Signed    RMR/MedQ  DD: 10/27/2008  DT: 10/28/2008  Job #: [id number]   CC:    Di Kindle. Edilia Bo, M.D.

## 2010-10-18 NOTE — Assessment & Plan Note (Signed)
OFFICE VISIT   Charlotte Henry, Charlotte Henry  DOB:  09-Feb-1941                                       01/19/2009  [id number]#:[id number]   I saw the patient in the office today for continued followup of her  peripheral vascular disease.  I last saw her in September of 2008 at  which time her symptoms were quite stable.  She came in for a routine  followup Doppler study of her lower extremities.   Since I saw her last she continues to have claudication which is equal  in both legs.  She experiences claudication in the calves associated  with ambulation and relieved with rest.  She can essentially walk from  the one end of the grocery store to the other before she experiences  symptoms.  Her symptoms have been relatively stable over the last year.  She has had no significant rest pain and no history of nonhealing  ulcers.   REVIEW OF SYSTEMS:  She has had no chest pain, chest pressure,  palpitations or arrhythmias.  PULMONARY:  She has had no productive cough, bronchitis, asthma or  wheezing.   PHYSICAL EXAMINATION:  This is a pleasant 70 year old woman who appears  her stated age.  Blood pressure is 135/75, heart rate is 52.  Neck is  supple.  There are no carotid bruits.  Lungs are clear bilaterally to  auscultation.  On cardiac exam she has a regular rate and rhythm.  She  has palpable femoral pulses bilaterally.  I cannot palpate popliteal or  pedal pulses.  She does have fairly brisk Doppler signals in both feet.  ABI on the right is 38%, on the left 63% which has not changed  significantly since February.   Overall her symptoms are quite stable and I would not recommend  arteriography and consideration for revascularization unless she  developed disabling claudication, rest pain or nonhealing ulcer.  I will  plan on seeing her back in 1 year.  She knows to call sooner if she has  problems.  We did discuss the importance of tobacco cessation.   Di Kindle.  Edilia Bo, M.D.  Electronically Signed   CSD/MEDQ  D:  01/19/2009  T:  01/20/2009  Job:  [id number]   cc:   Annia Friendly. Loleta Chance, MD

## 2010-10-18 NOTE — Letter (Signed)
April 24, 2007    Annia Friendly. Loleta Chance, MD  [address]  [place], [place] [address]   RE:  TIOMBE, TOMEO  MRN:  [id number]  /  DOB:  07-29-1940   Dear Earvin Hansen:   Ms. Benecke return to the office for continued assessment and treatment of  coronary disease, peripheral vascular disease, and cerebrovascular  disease.  Since her last visit, she was evaluated by Dr. Edilia Bo, who  found her to have moderate-to-severe obstruction of flow to the lower  extremities.  He is not convinced that her chronic right leg pain is  related to this and is deferring plans for angiography and surgery.  Ms.  Stelzner is scheduled to be further evaluated for possible neuropathy.  She  does not think she has diabetic neuropathy since glucose control has  been superb.  Her last hemoglobin A1C was 6.4.  We have finally achieved  good control of hyperlipidemia, currently with atorvastatin 40 mg daily.  Her other medications include Neurontin 300 mg q.i.d., which has  provided no benefit as far as she can tell, Amaryl 4 mg daily, Diovan  160 mg daily, and Tylenol Arthritis 500 mg daily.   PHYSICAL EXAMINATION:  A pleasant, overweight woman in no acute  distress.  Weight 199, stable.  Blood pressure 115/75, heart rate 56 and regular,  respirations 18.  NECK:  Mild jugular venous distention; normal carotid upstrokes with  faint bilateral bruits.  LUNGS:  Moderately prolonged expiratory phase with expiratory rhonchi.  CARDIAC:  Normal first and second heart sounds; basilar systolic  ejection murmur.  ABDOMEN:  Soft and nontender; no bruits appreciated; no organomegaly.  EXTREMITIES:  Decreased distal pulses; no significant edema.   Most recent lipid profile is from September, at which time total  cholesterol was 109, triglycerides 179, HDL 42, and LDL 31.  LFTs were  normal.   IMPRESSION:  Ms. Exley is continuing to be troubled by right lower  extremity pain.  She is scheduled to be re-evaluated by Dr.  Edilia Bo in  approximately 4-5 months and to see Dr. Okey Dupre in the near future.  We  will obtain a carotid ultrasound to follow cerebrovascular disease.  Her  last study was more than three years ago, at which time she had plaque  without significant focal stenosis.  I have strongly recommended that  she start nicotine patches and stop smoking.  I will see this nice woman  again in eight months.    Sincerely,      Gerrit Friends. Dietrich Pates, MD, [facility]  Electronically Signed    RMR/MedQ  DD: 04/24/2007  DT: 04/25/2007  Job #: [id number]   CC:    Di Kindle. Edilia Bo, M.D.

## 2010-10-18 NOTE — Consult Note (Signed)
VASCULAR SURGERY CONSULTATION   Charlotte Henry, Charlotte Henry  DOB:  1940/07/31                                       02/12/2007  [id number]#:[id number]   I saw the patient in the office today in consultation concerning her  peripheral vascular disease.  This is a pleasant 70 year old woman who  was referred by Dr. Loleta Chance.  She states that she has had a long history of  some paresthesia in the right foot and pain in the right foot which  occurs all the time.  She had a history or neuropathy and had been on  Neurontin for this but for a while could not take it because of some  mental status changes.  She is apparently back on it now.  She has also  had some right calf pain which occurs with ambulation and relieved  somewhat with rest.  She also notes some pain in her right foot which is  not necessarily relieved with dependency.  She has had symptoms of left  also but not to the same degree.  She has had no history of nonhealing  wounds.   PAST MEDICAL HISTORY:  Significant for adult onset diabetes which was  diagnosed in 57.  She does not require insulin.  In addition she has  hypertension, hypercholesterolemia and emphysema.  She denies any  history of myocardial infarction and has no history of congestive heart  failure.  She has had previous PTCA, she believes by Dr. Daleen Squibb in 2004.   FAMILY HISTORY:  There is no history of premature cardiovascular  disease.   SOCIAL HISTORY:  She is widowed.  She has 2 children.  She smokes a pack  per day of cigarettes has had been smoking for 40 to 50 years.   REVIEW OF SYSTEMS AND MEDICATIONS:  Are documented on the medical  history form in her chart.   PHYSICAL EXAMINATION:  Vital signs:  Blood pressure is 188/94, heart  rate 71.  I did not detect any carotid bruits.  Lungs: Clear bilaterally  to auscultation.  Cardiac:  She has a regular rate and rhythm.  Abdomen:  Soft and nontender.  She has slightly diminished but palpable femoral  pulses.  She has monophasic Doppler signals in both feet.   She did not have a Doppler study which showed an ABI of 25% on the right  and 57% on the left.  She has no ischemic ulcers in her feet.   She did complain of some persistent calf pain which she has had and was  worried that she might have a blood clot so we did obtain a venous  duplex scan today which showed no evidence of DVT in the right lower  extremity.   With respect to the peripheral vascular disease she has eminence of  significant multi level arterial occlusive disease on the right and I  think a lot of her symptoms could be related to her neuropathy.  If her  symptoms progress then she understands that would proceed with  arteriography to see what options we might have for re-vascularization.  She would be at slightly increased risk for bypass procedure given her  obesity.  Plan on seeing her back in 6 months.  She knows to call sooner  if she has problems.  In the meantime, I have encouraged her to stay as  active as possible.   Di Kindle. Edilia Bo, M.D.  Electronically Signed  CSD/MEDQ  D:  02/12/2007  T:  02/13/2007  Job:  [id number]   cc:   Annia Friendly. Loleta Chance, MD

## 2010-10-18 NOTE — Letter (Signed)
January 03, 2008    Annia Friendly. Loleta Chance, MD  [address]  [place], [place]  [address]   RE:  RHIANNA, RAULERSON  MRN:  [id number]  /  DOB:  1941/03/23   Dr. Earvin Hansen:   Ms. Sanjurjo returns to the office today for continued assessment and  treatment of coronary disease and cardiovascular risk factors.  Since  her last visit, she has done fine from a cardiopulmonary standpoint.  She reports no dyspnea nor chest discomfort.  She continues to be  followed by Dr. Edilia Bo for vascular disease.  There has been no  progression in her lower extremities or her carotids.  Control of  hyperlipidemia was good when last assessed a few months ago.  Unfortunately, she has fairly severe neuropathic symptoms in her legs,  which she has recently received and treated with infrared radiation with  benefit.   Her dose of Neurontin is maximal since she has somnolence with her  current medication.   CURRENT MEDICATIONS:  1. Diovan 160 mg daily.  2. Atorvastatin 40 mg daily.  3. Amaryl 4.5 mg daily.  4. Neurontin 300 mg t.i.d.  5. Metanx 1 daily.  6. Fish oil 3 daily.  7. Calcium and vitamin D daily.  8. Nexium 40 mg daily.  9. Enteric-coated aspirin 81 mg daily.   She reports a rash on her left leg that cleared with antifungal therapy.  She subsequently developed pruritus behind both ears.  This was improved  on the right but not the left.  There is scant drainage from the skin in  that region as well.   PHYSICAL EXAMINATION:  GENERAL:  Subdued woman in no acute distress.  VITAL SIGNS:  The weight is 196, 3 pounds less than in November of last  year.  Blood pressure 120/80, heart rate 75 and regular, respirations  14.  NECK:  No jugular venous distention; no carotid bruits.  SKIN:  Flakiness without any appreciable drainage behind the left ear.  LUNGS:  Few inspiratory and expiratory rhonchi.  Minimal expiratory  wheeze.  CARDIAC:  Normal first and second heart sounds; modest systolic murmur.  ABDOMEN:  Soft and nontender; no bruits.  EXTREMITIES:  No edema.   IMPRESSION:  Ms. Looman is doing generally well.  I have recommended no  changes in her medications.  I did not further discussed cigarette  smoking with her - she appears to be committed to continuing at this  point.  Lipid control is good with total cholesterol of 156,  triglycerides of 122, HDL of 47 and LDL of 85.  You might consider  increasing atorvastatin to 80 mg daily for even lower LDL cholesterol.  I will plan to see this nice woman again in 9 months.    Sincerely,      Gerrit Friends. Dietrich Pates, MD, [facility]  Electronically Signed    RMR/MedQ  DD: 01/03/2008  DT: 01/04/2008  Job #: [id number]

## 2010-10-21 NOTE — Letter (Signed)
June 08, 2006    Annia Friendly. Loleta Chance, MD  [address]  [place], [place] [address]   RE:  ALAIZA, YAU  MRN:  [id number]  /  DOB:  March 26, 1941   Dear Earvin Hansen:   Ms. Roussin was seen in the office today after an absence of approximately  18 months.  We inadvertently failed to schedule a return visit for her.  She called for medication renewal and was advised to be seen.  From a  cardiac standpoint, she is now doing well.  She did have problems with  abdominal pain and was completely evaluated by Dr. Cira Servant, without  important pathology identified.  She has had no chest discomfort or  dyspnea.  She does have pain in the left calf and anterior left foot  that is suggestive of claudication.  She has tenderness of the left  great toe with some surrounding erythema that is of concern to her, as  well as increased pigmentation under the nail.  She has not had problems  with asthma over the past year.  She currently has rhinorrhea and a non-  productive cough that she attributes to a URI.  Her lipid-lowering  therapy has been changed, but she is not certain why.  Vaccinations are  up to date.   CURRENT MEDICATIONS:  Include:  1. Bentyl 20 mg t.i.d.  2. Tylenol 500 mg daily.  3. Diovan 160 mg daily.  4. Actos 15 mg daily.  5. Amaryl 4.5 mg daily.  6. Ezetimibe 10 mg daily.  7. Nexium 40 mg daily.   She monitors blood glucose values at home with generally excellent  results.   ALLERGIES:  SHE IS NOT ON ASPIRIN, DUE TO A HISTORY OF ASPIRIN ALLERGY.   PHYSICAL EXAMINATION:  GENERAL:  Pleasant, overweight woman in no acute  distress.  VITAL SIGNS:  The weight is 200 pounds, 4 pounds more than in July 2006.  Blood pressure 120/80, heart rate 85 and regular, respirations 16.  NECK:  No jugular venous distension; normal carotid upstrokes without  bruits, although bruits have been noted in the past.  LUNGS:  Clear.  CARDIAC:  Normal first and second heart sounds; fourth  heart sound  present; normal PMI.  ABDOMEN:  Soft and nontender; no bruits; no organomegaly.  EXTREMITIES:  1+ left dorsalis pedis; left posterior tibial could not be  obtained by palpation or by Doppler.   No recent laboratory reports available.   IMPRESSION:  Ms. Huneke is doing generally well from a cardiac  standpoint.  I believe that she has no critical ischemia of the left  leg, but formal ABIs will be obtained.  Basic lab including lipids,  chemistry profile and hemoglobin A1c level are also pending.  She  appears to have a viral bronchitis.  I will treat her with Tussionex.  She will call your office or mine if symptoms persist.  Plan to control  hyperlipidemia with adjustment of medications and re-assess this nice  woman in two months.  She is advised to discontinue cigarette smoking.  If she does not do successfully, we will try to convince her to accept  pharmacologic therapy on her next visit.    Sincerely,      Gerrit Friends. Dietrich Pates, MD, [facility]  Electronically Signed    RMR/MedQ  DD: 06/08/2006  DT: 06/08/2006  Job #: [id number]

## 2010-10-21 NOTE — Cardiovascular Report (Signed)
NAME:  Charlotte Henry, Charlotte Henry                         ACCOUNT NO.:  [id number]   MEDICAL RECORD NO.:  [id number]                   PATIENT TYPE:  INP   LOCATION:  6525                                 FACILITY:  [facility]   PHYSICIAN:  Veneda Melter, M.D.                   DATE OF BIRTH:  1941/05/29   DATE OF PROCEDURE:  09/25/2002  DATE OF DISCHARGE:  09/26/2002                              CARDIAC CATHETERIZATION   PROCEDURE PERFORMED:  1. Primary stent placement to the mid left anterior descending artery.  2. Percutaneous transluminal coronary angioplasty and stent placement to the     proximal and mid right coronary artery.   DIAGNOSES:  1. Two-vessel coronary artery disease.  2. Unstable angina.   HISTORY:  The patient is a 70 year old black female with morbid obesity,  diabetes mellitus, hypertension, and peripheral vascular disease who  presents with substernal chest discomfort.  The patient had several episodes  occurring at rest and was finally admitted to [facility], and due  to diffuse Q wave changes on her ECG, was transferred to [facility] for  further assessment.  Subsequent cardiac enzymes showed no acute myocardial  injury.  She underwent cardiac catheterization by Dr. Dorethea Clan showing severe  2-vessel coronary artery disease involving the LAD and right coronary artery  with normal LV function.  She was referred for percutaneous intervention.   TECHNIQUE:  Informed consent had been obtained.  The existing 6-French  sheath in the right femoral artery was utilized.  Initially, an attempt was  made using a 6-French JL 3.5 guide catheter; however, due to tortuosity at  the access site, appropriate guide support could not be obtained.  Subsequently, a long 7-French sheath was positioned in the right femoral  artery.  The patient had been pretreated with aspirin and Plavix and was  given heparin to maintain an ACT of greater than 250 seconds.  A 7-French JL  3.5 guide  catheter was used to engage the left anterior descending artery,  and selective guide shots obtained.  A 0.014-inch Forte wire was advanced  into the vessel and used to size the vessel diameter and lesion length.  This was then advanced to the distal LAD.  A 3.5 x 13-mm Hepacoat stent was  then introduced, carefully positioned in the mid LAD in the area of mid  stenosis, and primarily deployed at 16 atmospheres for 45 seconds.  Repeat  angiography showed an excellent result with reduction of 80% to 90%  narrowing to less than 10%.  There was no evidence of vessel damage and TIMI-  3 flow through the LAD.  This was deemed an acceptable result.  Attention  was then turned to the right coronary artery.  A JR4 catheter was removed,  and an attempt made to introduce a 7-French JR4 catheter; however,  difficulty was encountered in advancing this through the sheath, and so, a  6-  [place] JR4 guide catheter was introduced.  This could successfully be  advanced through the sheath from the distal aorta and engaged in the RCA.  Selective guide shots were then obtained.  This showed the right coronary  artery to be a large caliber vessel.  There was an ostial narrowing of 50%  followed by sequential narrowings of 70% in the proximal segment involving  the procedure bend.  The mid RCA had mild diffuse disease of 30%.  The  distal segment had a tubular narrowing of 50% to 60% prior to the  bifurcation with the PDA.  The posterior descending artery had mild diffuse  of 30% to 40%.  The Forte wire was advanced in the proximal RCA and used to  size the lesion length.  This was then advanced distally.  A 2.0 x 15-mm  Maverick balloon was introduced and used to predilate the lesion at the bend  at 6 atmospheres for 30 seconds.  A repeat inflation was performed in the  proximal RCA at 6 atmospheres for 30 seconds; however, residual waist was  noted in the distal lesion.  Further inflation up to 16 atmospheres  for 20  seconds was successful in dilating this lesion.  A 3.0 x 23-mm Cypher stent  was then introduced, carefully positioned in the proximal right coronary  artery to encompass the proximal bend, and deployed at 12 atmospheres for 30  seconds.  Repeat angiography showed an excellent result with full coverage  of the lesion, and only modest residual disease of 20%.  A 3.5 x 15-mm  Quantum Maverick balloon was introduced.  Two inflations were performed  within this stented segment at 16 atmospheres for 30 seconds each.  Repeat  angiography showed no residual stenosis within the stented segment.  Unfortunately, there was a concentric stenosis just distal to the stent of  70% that did not resolve with intracoronary nitroglycerin.  It was not clear  that this was a dissection or spasm, and despite tincture of time and  nitroglycerin, this did not resolve, and we elected to cover this with a 3.5  x 8-mm Cypher stent.  This was deployed at 14 atmospheres for 30 seconds.  The stent delivery system was retracted slightly and used for __________  junction of the two stents at 16 atmospheres for 30 seconds.  Repeat  angiography now showed an excellent result with no residual stenosis in the  proximal and mid right coronary artery.  There was TIMI-3 flow distally, and  no distal vessel damage.  This was deemed an acceptable result.  The guide  catheter was then removed, and the sheath secured into position.  The  patient was transferred to the holding area in stable condition.  The sheath  will be removed when the ACT returns to normal.   FINAL RESULT:  1. Successful primary stent placement to the mid LAD with reduction of 80%     narrowing to less than 10% with placement of a 3.5 x 13-mm Hepacoat     stent.  2. Successful PTCA and stent placement to the proximal and mid right     coronary artery with reduction of sequential 70% narrowings to 0% with    placement of a 3.0 x 23-mm Cypher stent in  the proximal segment, dilated     to 3.5 mm, followed by a 3.5 x 8-mm Cypher stent in the mid section.   ASSESSMENT AND PLAN:  The patient is a 70 year old female with severe  2-  vessel coronary artery disease who has undergone percutaneous intervention.  She has had drug-eluting stents placed in the proximal right coronary  artery.  She will be continued on Plavix for a minimum of 6 months' time.  The patient has residual disease in the distal right coronary artery that  does not appear to be of a critical nature; however, should she have  recurrence of symptoms or evidence of ischemia in this territory,  percutaneous intervention may be considered.                                               Veneda Melter, M.D.    Melton Alar  D:  09/25/2002  T:  09/27/2002  Job:  [id number]   cc:   [place] Bing, M.D.   Annia Friendly. Loleta Chance, M.D.  [address]  [place]  [place] [address]  Fax: [phone]

## 2010-10-21 NOTE — Discharge Summary (Signed)
NAME:  Charlotte Henry, Charlotte Henry                         ACCOUNT NO.:  [id number]   MEDICAL RECORD NO.:  [id number]                   PATIENT TYPE:  INP   LOCATION:  6525                                 FACILITY:  [facility]   PHYSICIAN:  Arturo Morton. Riley Kill, M.D.             DATE OF BIRTH:  10-Oct-1940   DATE OF ADMISSION:  09/24/2002  DATE OF DISCHARGE:  09/26/2002                           DISCHARGE SUMMARY - REFERRING   HISTORY OF PRESENT ILLNESS:  The patient is a 70 year old black female who  was admitted to [facility] for chest discomfort.  We were asked to  see her in consultation on September 24, 2002.  The patient stated on April 19  she woke up with discomfort which has lasted approximately two hours and  felt that it was relieved with Tylenol.  On April while she was sitting in a  chair her discomfort returned and nitroglycerin eased her discomfort.  She  describes this as a crushing substernal pain radiating to the right arm and  tingling in the right hand with mild left arm pain.  She has associated  nausea and diaphoresis.  She denies shortness of breath.  She states that  she has never felt this discomfort before.   PAST MEDICAL HISTORY:  1. She also has a history of obesity,  2. Chronic bronchitis.  3. Diabetes.  4. Peripheral vascular disease.  5. Bilateral carpal tunnel.  6. Diverticulosis.  7. GERD.  8. Status post hysterectomy.   ALLERGIES:  She has also admitted that she has a true aspirin allergy for  which she develops periorbital edema and rash.   SOCIAL HISTORY:  She also has a history of tobacco use.   LABORATORY DATA:  Chest x-ray showed chronic bronchitis but no acute  findings.   Urinalysis was notable for 100 mg/dl of glucose.  Admission H&H at [facility] was 12.5 and 37.5, normal indices.  Platelets 279, WBC 10.  Sodium 135,  potassium 3.8, BUN 17, creatinine 0.9, glucose 228.  PT 13, PTT 25.  Serial  of CK-MBs and troponins were negative for  myocardial infarction.  Her  hemoglobin A1c was elevated at 8.5.   EKG showed normal sinus rhythm, diffuse T-wave inversion.  No old EKGs were  available for comparison.   HOSPITAL COURSE:  The patient was transferred to [facility] to  undergo cardiac catheterization and further evaluation of her abnormal EKG  and her symptoms.  She was continued on IV heparin at transfer.  Diabetes  coordinator also saw the patient.  On April 22 she had not had any further  problems.  She had ruled out for myocardial infarctions and a recheck of her  lipids showed an elevated total cholesterol of 232, triglycerides 205, HDL  57, and LDL 134.  She was started on the statin.  Cardiac rehab saw her with  assistance with ambulation and education as well as  smoking cessation  consult.  On April 22 Dr. Chales Abrahams performed cardiac catheterization.  According to his progress note she had an 80% mid LAD which he reduced  utilizing a Cypher stent to 10%.  She also had a proximal mid 70% RCA, in  which he also utilized a Cypher stent to reduce this to 0%.  She had a  distal 50-60% RCA.  It is not apparent by his catheterization if he did an  LV gram.  Case management assisted in discharge planning.  By April 23 she  was ambulating the halls without difficulty.  Catheterization site was  intact.  At the time of discharge it was noted that the patient had not been  consistent with her Plavix in the past.  Dr. Riley Kill spent a considerable  amount of time explaining the importance of controlling her sugars,  sensation, cessation of tobacco use, and the importance of her taking her  medications every day.   DISCHARGE DIAGNOSES:  1. Unstable angina.  2. Coronary artery disease, status post Cypher stenting to the proximal     right coronary artery and proximal left anterior descending.  3. Hyperlipidemia.  4. Hyperglycemia with elevated hemoglobin A1c and a history of insulin     dependent diabetes.  5.  Obesity.  6. Tobacco use, history of it previously.   DISPOSITION:  Her new prescriptions include:  1. Plavix 75 mg daily for six months.  2. Toprol XL 25 mg daily.  3. Zocor 40 q.h.s.  4. Nitroglycerin 0.4 as needed.  5. She was advised to continue her Amaryl 4 mg daily.  6. Cardizem LA 240 mg daily.  7. Nexium 40 daily.  8. Pletal 100 mg daily.  9. Novolin 70/30 five units daily.   ACTIVITY:  She was advised of no lifting, driving, sexual activity, or heavy  exertion for two days.   DIET:  Maintain low salt, fat, cholesterol, ADA diet.   FOLLOW UP:  If she has any problems with her catheterization site she was  asked to call.  She was asked to arrange a 1-2 week appointment with Dr.  Loleta Chance and to record her blood sugars before meals and at bedtime.  She was to  report to Dr. Loleta Chance and have adjustments of her diabetic medications and  lifestyle changes for better glycemic control.  She was advised no smoking  or tobacco products.  She will follow up with Dr. Marvel Plan P.A. on May 10  at 1:45 p.m.  At that time arrangements will need to be made for fasting lipids and LFTs  in approximately 6-8 weeks, since Zocor was initiated.  Also consider  getting the patient involved in the cardiac rehab program for weight loss  and reviewed cardiac risk factor modifications.  If the patient remains  hypertensive, she may benefit from an ACE inhibitor.     Joellyn Rued, P.A. [facility]                    Thomas D. Riley Kill, M.D.    EW/MEDQ  D:  09/26/2002  T:  09/27/2002  Job:  [id number]   cc:   Annia Friendly. Loleta Chance, M.D.  [address]  [place]  [place] [address]  Fax: [phone]   [place] Bing, M.D.

## 2010-10-21 NOTE — Consult Note (Signed)
Charlotte Henry, Charlotte Henry               ACCOUNT NO.:  [id number]   MEDICAL RECORD NO.:  [id number]          PATIENT TYPE:  AMB   LOCATION:  DAY                           FACILITY:  [facility]   PHYSICIAN:  Kassie Mends, M.D.      DATE OF BIRTH:  [birth year]   DATE OF CONSULTATION:  DATE OF DISCHARGE:                                   CONSULTATION   Dear Dr. Loleta Chance:   I am seeing Charlotte Henry as a new patient in consultation.  I am seeing her for  abdominal pain.   Charlotte Henry is a 70 year old female who complains of pain in the right upper  quadrant for two months. She describes it as a soreness.  Her skin also  feels like it is numb on the outside.  She feels a sharp, stabbing pain on  the inside.  She has had this all day every day for the last two months.  She denies any radiation.  She may get hot and perspire, and feel like it is  associated with the pain.  She feels like it is intestinal related.  She  had a CT scan approximately one month ago which she reports as negative.  After she has a bowel movement the pain gets better, but in a little while  it returns. She denies any bloody diarrhea.  She had a fever of 101 degrees  Fahrenheit once. She is nauseous some mornings.  She may vomit once a week.  She denies any blood in her stool, blood in the vomitus. She denies any  weight loss. She was requiring laxatives to have a bowel movement, but now  she takes Colace and her bowel movements are more regular.  She stopped  taking her Indocin approximately two weeks ago.  She was taking it one to  three times a day.  She denies any history of low blood counts.  She cannot  pinpoint any precipitating factors. She describes her pain as worse because  her pain is more frequent.   PAST MEDICAL HISTORY:  1.  Hypertension.  2.  Asthma.  3.  MI  4.  Diabetes.  5.  Anxiety.  6.  Pneumonia.  7.  Hyperlipidemia.   ALLERGIES:  CODEINE, PENICILLIN, ASPIRIN, NAPROXEN, BIAXIN   MEDICATIONS:  She takes  Nexium, Bentyl, Amaryl, Actos, Zetia, Diovan,  Colace, and Indocin.   FAMILY HISTORY:  She has a sister who died of colon cancer at age 106 in the  60s.   SOCIAL HISTORY:  She smokes one pack a day. She drinks two beers a day.  She  is widowed.  She is a retired Lawyer.   REVIEW OF SYSTEMS:  Colonoscopy approximately one year ago by Dr. Katrinka Blazing and  she reports polyps.  Her review of systems is as per HPI.  Otherwise, all  systems are negative.   PHYSICAL EXAMINATION:  VITAL SIGNS:  Weight 206 pounds, height 5 feet 7  inches, temperature 98.6, blood pressure 160/80, pulse 64.  GENERAL:  She is in no apparent distress, alert and oriented times four.  HEENT:  Normocephalic  and atraumatic. Pupils equal, round, and reactive to  light. Mouth with no oral lesions. Posterior pharynx without erythema or  exudate.  NECK:  Full range of motion with no lymphadenopathy. LUNGS:  Clear  to auscultation bilaterally. CARDIOVASCULAR:  Regular rhythm.  No murmur.  Normal S1 and S2.  ABDOMEN:  Bowel sounds present, soft, nondistended, mild  tenderness to palpation in the right upper quadrant.  No rebound or  guarding.  Hyperesthesia in the right upper quadrant.  No  hepatosplenomegaly.  Obese.  EXTREMITIES:  No clubbing, cyanosis, or edema.  NEUROLOGIC:  She has no  focal neurologic deficits.   Labs from Dr. Adaline Sill office in June 2007 revealed creatinine of 1.1,  potassium 4.6, calcium 9.7, albumin 4.4, AST 12, ALT 12, alkaline  phosphatase 58, total bilirubin 0.5, cholesterol 248.  Labs in February  2007:  White count 9.8, hemoglobin 10.9, MCV 88, platelet 244,000.   Charlotte Henry is a 70 year old female with right upper quadrant pain which is  likely duodenitis.  Her numbness in her right upper quadrant may be related  to disk disease.  The differential diagnosis also includes peptic ulcer  disease and a low likelihood of malignancy.  She has a history of  adenomatous polyps. Thank you for allowing me to  see Charlotte Henry in  consultation.  My recommendations follow.   She is already scheduled for a colonoscopy on August 1st.  We will add an  upper endoscopy and possible biopsy of any abnormality if seen.  She did  likely have an MRI of the thoracic spine to evaluate for impingement of  nerves due to her hyperesthesia in her right upper quadrant. She is  instructed to stop t he Indocin and to use Tylenol as needed for pain. She  can continue the Nexium.  She will follow up with me in three months.   Please feel free to contact me at [phone] with additional questions.      Kassie Mends, M.D.  Electronically Signed     SM/MEDQ  D:  12/29/2005  T:  12/29/2005  Job:  [id number]   cc:   Annia Friendly. Loleta Chance, MD  Fax: [phone]

## 2010-10-21 NOTE — H&P (Signed)
   NAME:  Charlotte Henry, Charlotte Henry                         ACCOUNT NO.:  [id number]   MEDICAL RECORD NO.:  [id number]                   PATIENT TYPE:  INP   LOCATION:  A226                                 FACILITY:  [facility]   PHYSICIAN:  Annia Friendly. Loleta Chance, M.D.                DATE OF BIRTH:  May 18, 1941   DATE OF ADMISSION:  09/23/2002  DATE OF DISCHARGE:                                HISTORY & PHYSICAL   ADDENDUM:   PLEASE INCLUDE THESE UNDER SECONDARY DIAGNOSES:  1. 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  2. Bilateral carpal tunnel syndrome.                                               Annia Friendly. Loleta Chance, M.D.    Levonne Hubert  D:  09/23/2002  T:  09/23/2002  Job:  [id number]

## 2010-10-21 NOTE — H&P (Signed)
NAME:  Charlotte Henry, Charlotte Henry                         ACCOUNT NO.:  [id number]   MEDICAL RECORD NO.:  [id number]                   PATIENT TYPE:  INP   LOCATION:  A226                                 FACILITY:  [facility]   PHYSICIAN:  Thomas C. Wall, M.D. [facility]            DATE OF BIRTH:  [birth year]   DATE OF ADMISSION:  09/23/2002  DATE OF DISCHARGE:                                HISTORY & PHYSICAL   We were asked to see the patient by Dr. Loleta Chance here at [facility] for  chest discomfort.   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  Ejection fraction at that time was  56%.   She presented to the emergency room with crushing substernal chest  discomfort that goes into her right shoulder.  This discomfort woke her up  on 09/22/02.  She has also had pain on 09/23/02 and had pain again this  morning about 4:30 a.m.  It was slowly relieved this morning with a  nitroglycerin patch by the nurses.  EKG at that time showed deep T wave  inversion in the entire anterior septal lateral leads.  She also had some  mild ST elevation in AVL.  This resolved once her pain resolved, but her T  wave inversion  remained.   ALLERGIES:  1. CODEINE.  2. PENICILLIN.  3. ASPIRIN.  4. NAPROSYN.   CARDIAC RISK FACTORS:  Age, hypertension, diabetes type 2 x about five to  six years, peripheral vascular disease in the left lower extremity, tobacco  use, hyperlipidemia.   She has had bilateral carpal tunnel syndrome repair, diverticulosis,  gastroesophageal reflux, foraminal disk herniation at C6.   MEDICATIONS PRIOR TO ADMISSION:  1. Nexium 40 mg daily.  2. Amaryl 4 mg p.o. daily.  3. Cardizem LA 240 daily.  4. Plavix 75 mg daily.  5. Novolin 70/30 five units daily.  6. Pletal 100 mg p.o. daily.   SOCIAL HISTORY:  She lives in [place].  She is a widow and  has three  children.  She smokes.  She only drinks socially.   FAMILY HISTORY:  Really noncontributory.   REVIEW OF SYSTEMS:  Noncontributory other than HPI.   PHYSICAL EXAMINATION:  VITAL SIGNS:  Blood pressure 183/97, heart rate 68  and regular, respiratory rate 20, temperature 97.3.  O2 saturation 100% on  two liters.  GENERAL:  She is in no acute distress.  SKIN:  Warm and dry.  HEENT:  Unremarkable.  NECK:  No jugular venous distention.  Carotid upstrokes are equal  bilaterally without bruits.  There is no thyromegaly.  CARDIAC:     Normal S1 and S2 without S4.  She has a soft systolic murmur  along the left sternal border.  LUNGS:  Remarkable for bibasilar crackles.  ABDOMEN:  Soft.  Good bowel sounds.  No tenderness.  Organomegaly is  difficult to assess.  EXTREMITIES:  No clubbing, cyanosis or edema.  Pulses were 1+/4+  bilaterally.  NEURO:  Intact.   Hemoglobin 12.5, potassium 3.8, creatinine 0.9.  Cardiac enzymes showed CPK  and MB to be negative x2 and troponin to be negative x2.  Hemoglobin A1c is  8.5%.  Coags are normal.   ASSESSMENT/PLAN:  1. Question substernal chest pain radiates to the right shoulder.     Consistent with unstable angina.  She has multiple cardiac risk factors     as mentioned above.  She has new  EKG changes with deep T wave inversion     all the way across the anterior and lateral precordium.  Cardiac     catheterization has been recommended.  She is quite reluctant, but     finally agreed after extensive discussion about the importance of this.     Particularly, in regard to how we treat.  Indications of risks, potential     benefits were covered thoroughly.  We will also had intravenous heparin     to her program and continue the nitroglycerin patch.  She will remain on     Plavix.  She also takes Zocor.  Will also add a  beta blocker in the form     of Lopressor 25 b.i.d.  2. History of hypertension.  3. Peripheral vascular disease, left  lower extremity.  4. Hyperlipidemia.  5. Diabetes mellitus type 2.  6. Tobacco use.  7. Obesity.   1.                                                 Jesse Sans. Daleen Squibb, M.D. [facility]    TCW/MEDQ  D:  09/24/2002  T:  09/24/2002  Job:  [id number]   cc:   [place] Bing, M.D. [facility]

## 2010-10-21 NOTE — Cardiovascular Report (Signed)
NAME:  Charlotte Henry, Charlotte Henry                         ACCOUNT NO.:  [id number]   MEDICAL RECORD NO.:  [id number]                   PATIENT TYPE:  INP   LOCATION:  6525                                 FACILITY:  [facility]   PHYSICIAN:  Vida Roller, M.D.                DATE OF BIRTH:  May 22, 1941   DATE OF PROCEDURE:  09/25/2002  DATE OF DISCHARGE:  09/26/2002                              CARDIAC CATHETERIZATION   PROCEDURES:  1. Left heart catheterization.  2. Selective coronary angiography.  3. Left ventriculography.   CARDIOLOGIST:  Vida Roller, M.D.   HISTORY OF PRESENT ILLNESS:  The patient is a 70 year old African-American  woman with a history of hypertension, hyperlipidemia, and diabetes mellitus,  who presents with unstable angina.  She has no known coronary artery disease  and had previously been evaluated with a stress test a number of years ago  without significant disease.   DETAILS OF THE PROCEDURE:  After obtaining informed consent, the patient was  brought to the cardiology catheterization laboratory in a fasting state.  There she was prepped and draped in the usual sterile manner.  The right  femoral artery was anesthetized using 1% Lidocaine without epinephrine.  The  right femoral artery was  cannulated 6-French 10 cm sheath, and left heart  catheterization was performed using a 6-French Judkins left #4, a 6-French  Judkins right #4, and a 6-French angled pigtail catheter.  The pigtail  catheter advanced under fluoroscopic guidance into the ascending aorta and  prolapsed across the aortic valve for continuous pressure monitoring. Under  fluoroscopic guidance, we then connected the pressure tracing system where  pressure tracings were obtained.  Then the catheter was connected to the  power injection system where a left ventriculogram as performed at a rate of  10 ml a second for a total of 30 ml of dye at one-half second rate of rise  at 600 psi.  We then proceeded  with left ventriculogram which was seen in  the 30 degree RAO view.  The catheter was connected to the pressure tracing  system and then prolapsed across the aortic valve in a continuous pressure  monitoring.  The catheter was removed.  The sheath was sewn into place, and  the patient proceeded to intervention.  Total fluoroscopic time was 8.6  minutes.  Total ionized contrast was 150 ml.   RESULTS:  1. Aortic pressure was measured at 166/73 with a mean arterial pressure of     113 mmHg.  2. Left ventricular pressure was measured at 165/9 with end-diastolic     pressure of 31 mmHg.   SELECTIVE CORONARY ANGIOGRAPHY:  1. There is no left main coronary artery.  2. The left anterior descending coronary artery has its own ostia.  It is a     large artery transapical vessel with a significant 99% stenosis in the     mid portion after a  first diagonal.  There is TIMI-3 flow in the distal     portion of the artery.  The first diagonal has a 75% lesion in its     ostium.  3. The left circumflex coronary artery has its own ostia.  It is a large     artery which has a 50% stenosis in its mid portion and its two large     obtuse marginals.  4. The right coronary artery is a moderate size dominant vessel with a 75%     stenosis in the proximal portion which is long and complex.  There is     also a significant 50% stenosis in the distal portion of the artery.  The     posterior descending coronary artery is moderate size and without     significant disease.   LEFT VENTRICULOGRAM:  Left ventriculogram reveals preserved left ventricular  function at 65% with no wall motion abnormalities and no mitral  regurgitation.   PLAN:  Our plan is to move forward with angioplasty of the left anterior  descending coronary artery and potentially stage the right coronary  angioplasty.                                                 Vida Roller, M.D.    JH/MEDQ  D:  09/25/2002  T:  09/27/2002  Job:   [id number]   cc:   Annia Friendly. Loleta Chance, M.D.  [address]  [place]  [place] [address]  Fax: [phone]   [place] Bing, M.D.

## 2010-10-21 NOTE — Op Note (Signed)
NAMECAMILAH, SPILLMAN               ACCOUNT NO.:  [id number]   MEDICAL RECORD NO.:  [id number]          PATIENT TYPE:  AMB   LOCATION:  DAY                           FACILITY:  [facility]   PHYSICIAN:  Jerolyn Shin C. Katrinka Blazing, M.D.   DATE OF BIRTH:  Nov 18, 1940   DATE OF PROCEDURE:  08/05/2004  DATE OF DISCHARGE:  08/05/2004                                 OPERATIVE REPORT   PREOPERATIVE DIAGNOSIS:  Left buttock mass.   POSTOPERATIVE DIAGNOSIS:  Left buttock mass.   PROCEDURE:  Wide excision of mass, left buttock.   DESCRIPTION:  The procedure was done in the minor procedure room.  The  patient's buttock area were was prepped and draped in a sterile field.  Local infiltration with 1% Xylocaine with epinephrine was carried out.  Wide excision of the 2 cm mass of left buttock was carried out down to  normal tissue.  The wound was closed with 2-0 chromic and 4-0 nylon.  Sterile dressing was placed.  The patient tolerated the procedure well.  She  was discharged from day surgery in satisfactory condition.      LCS/MEDQ  D:  08/21/2004  T:  08/22/2004  Job:  [id number]

## 2010-10-21 NOTE — Op Note (Signed)
NAMEZOUA, CAPORASO               ACCOUNT NO.:  [id number]   MEDICAL RECORD NO.:  [id number]          PATIENT TYPE:  AMB   LOCATION:  DAY                           FACILITY:  [facility]   PHYSICIAN:  Kassie Mends, M.D.      DATE OF BIRTH:  28-Sep-1940   DATE OF PROCEDURE:  01/04/2006  DATE OF DISCHARGE:                                 OPERATIVE REPORT   PROCEDURE:  1. Esophagogastroduodenoscopy with cold forceps biopsy.  2. Colonoscopy with cold-forceps biopsy.   MEDICATIONS:  1. Esophagogastroduodenoscopy:  Demerol 50 mg IV, Versed 4 mg IV.  2. Colonoscopy:  Demerol 50 mg IV, Versed 4 mg IV.   INDICATION FOR EXAMINATION:  Ms. Hagmann is a 70 year old female who has  persistent right upper quadrant abdominal pain on anti-inflammatory drugs.  She has a first-degree relative who had colon cancer at age less than 58.   FINDINGS:  1. Mild antral erythema.  Biopsies obtained for H pylori.  Otherwise      normal esophagus without evidence of inflammation, mass or Barrett's      esophagus.  Normal duodenum.  2. Redundant ileocecal valve.  Biopsies obtained via cold forceps.  The IC      valve was examined closely for evidence of adenomatous changes and none      were appreciated.  Because of the IC valve was redundant, a snare was      used to bring the IC valve into view.  3. Two 4-mm sessile sigmoid colon polyps removed via cold forceps biopsy.  4. Ascending colon diverticulosis.   RECOMMENDATIONS:  1. High fiber diet.  Ms. Parrilla was given a handout on diverticulosis and      high fiber diet.  2. Follow up biopsies.  3. She was asked to discontinue the use of Indocin.  She was given a      prescription for allopurinol 100 mg daily to prevent gouty flares.  4. She may follow up with me in 3 months.   PROCEDURE TECHNIQUE:  Physical exam was performed, and informed consent was  obtained from the patient after explaining all risks, benefits and  alternatives to the procedure which the  patient appeared to understand and  so stated.  The patient was connected to monitoring device and placed in  left lateral position.  Continuous oxygen was provided by nasal cannula and  IV medicine administered through indwelling cannula.  After administration  of sedation, the patient's esophagus was intubated and advanced under direct  visualization to the second portion of the duodenum.  The scope was  subsequently removed slowly by carefully examining the color, texture,  anatomy and integrity of the mucosa on the way out.   After following the EGD, a rectal exam was performed.  The he patient's  rectum was intubated, and the scope advanced under direct visualization to  the cecum.  The scope was subsequently removed slowly by carefully examining  the color, texture, anatomy and integrity of the mucosa on the way out.  The  patient was recovered in the endoscopy suite and discharged home in  satisfactory condition.      Kassie Mends, M.D.  Electronically Signed     SM/MEDQ  D:  01/04/2006  T:  01/04/2006  Job:  [id number]   cc:   Annia Friendly. Loleta Chance, MD  Fax: [phone]

## 2010-10-21 NOTE — H&P (Signed)
NAME:  Charlotte Henry, Charlotte Henry                         ACCOUNT NO.:  [id number]   MEDICAL RECORD NO.:  [id number]                   PATIENT TYPE:  INP   LOCATION:  A226                                 FACILITY:  [facility]   PHYSICIAN:  Annia Friendly. Loleta Chance, M.D.                DATE OF BIRTH:  1941/05/21   DATE OF ADMISSION:  09/23/2002  DATE OF DISCHARGE:                                HISTORY & PHYSICAL   HISTORY OF PRESENT ILLNESS:  The patient is a 70 year old widow, gravida 3,  para 3, AB 0, employed, black female from [place], [place]. She is  complaining of crushing chest pain. The pain started at midnight on September 22, 2002 while in bed. Duration of pain was greater than two hours. The  patient took several Tylenol for relief. She described the pain radiating  from the center of the chest to the right shoulder and down the right arm.  History is positive for nausea and diaphoresis with pain. The patient did  not experience vomiting, dizziness, shortness of breath and syncope. A  second episode of crushing pain occurred at 1300 on September 23, 2002 upon  arriving home from work. Again the pain was crushing in nature and it  radiated to the right shoulder and down the right arm. At this time, she  experienced sweating, nausea and weakness. The pain persisted greater than  two hours. Therefore she contacted family members about it. The patient was  transported to the ER by family members at [facility] for an evaluation. She  did not experience again shortness of breath with it.   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 Medical history  is negative for tuberculosis, cancer, sickle cell, and seizure disorder.   MEDICATIONS:  1. Nexium 40 mg p.o.  daily.  2. Amaryl 40 mg p.o. daily.  3. Cardizem LA 240 mg p.o. daily.  4. Plavix 75 mg p.o. daily.  5. Novolin 70/30, 5 units subcu every morning before breakfast.  6. Pletal 100 mg p.o. daily.   ALLERGIES:  The patient is allergic to plastic, CODEINE, PENICILLIN,  ASPIRIN, and NAPROSYN.   HABITS:  Positive for persistent cigarette smoking since teenager and  positive for chronic social use of ethanol. The patient denies the use of  street drugs.   In the emergency room, the patient was treated with oxygen at 1650,  nitroglycerin 0.4 mg sublingual at 1705, Plavix 300 mg p.o. at 1707 and  repeat nitroglycerin 0.4 mg sublingual at 1735. The patient is free of chest  pain at 1910 on the medicine floor.   FAMILY HISTORY:  Revealed mother deceased at age 20 secondary to  complications of gastrointestinal bleed; father deceased  age 5 secondary to  cerebral hemorrhage; four brothers deceased (all secondary to complications  of cirrhosis of liver); two sisters living age 38 in good health and age 71  with history of hypertension; one sister deceased in her 28's secondary to  complications of colon cancer; one sister living adult in good health and  one daughter living, health unknown.   REVIEW OF SYMPTOMS:  Positive for episodic chest pain, episodic abdominal  cramping, chronic episodic left leg pain, episodic wheezing, episodic  shortness of breath, episodic productive cough, heartburn. Review of systems  is negative for epistaxis, bleeding gum, hemoptysis, hematemesis, melena,  dysuria, gross hematuria, edema of leg, etc. Appetite is variable.   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 It should be noted that the patient has a  history of renal stones.   PHYSICAL EXAMINATION:  VITAL SIGNS:  In the emergency room, temperature  98.8, respirations 20,  blood pressure 176/101, pulse 79.  GENERAL:  Revealed a middle aged, medium height, medium frame, slightly over  weight, black female in no apparent respiratory distress.  HEENT:  Head normocephalic, ears normal auricle, external canal patent,  tympanic membrane pearly gray. Eyes, lids negative with ptosis. Sclerae  muddy, positive bilateral corneal implant. Extraocular movements intact.  Nose negative discharge. Mouth positive for upper dentures. Positive missing  teeth at bottom, remaining dentition fair. No bleeding gum.  __________  benign.  NECK:  Negative for lymphadenopathy or thyromegaly. No carotid bruits on  auscultation.  LUNGS:  Positive scattered rhonchi bilaterally.  HEART:  Audible S1, S2 without murmur, rub or gallop. Regular rate and  rhythm.  BREASTS:  No skin changes, no nodule on palpation. Nipple erect without  discharge.  CHEST:  No lesion, positive tenderness along the sternum and right pectoral  area on palpation. Deltoid muscle of right arm was positive with tenderness.  ABDOMEN:  Obese, hyperactive bowel sounds, positive for old healed mid  hypogastric surgical scar. Soft, nontender in all four quadrants. No  palpable mass, no organomegaly.  PELVIC:  Deferred.  RECTAL:  Deferred.  EXTREMITIES:  No tibial edema, nonpalpable left dorsalis pedis, palpable  weak right dorsalis pedis. Knees positive for crepitus and stiffness  bilaterally.  NEUROLOGIC:  Alert and oriented to person, place and time. Cranial nerves II-  XII appeared intact.   EKG, sinus rhythm nonspecific, anterolateral T abnormality. Chest x-ray was  read as chronic bronchitic changes, no acute pulmonary findings.   LABORATORY DATA:  Sodium 135, potassium 3.8, chloride 104, CO2 25, glucose  228, BUN 17, creatinine 0.9, calcium 9.2, prothrombin time 13.0, INR 0.9,  positive thromboplastin time 25, total CPK 127, CK-MB 1.4, troponin I 0.03. White count 10.3, hemoglobin 12.5, hematocrit 37.5,  platelets 279,000.   IMPRESSION:  Primary angina pectoris.   SECONDARY DIAGNOSES:  1. 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  2. Hypertension.  3. Peripheral vascular disease.  4. Hyperlipidemia.  5. Type 2 diabetes mellitus on insulin.  6. Spasmodic colon.  7. Gastroesophageal reflux disease.  8. Diverticulosis.   PLAN:  Admit to telemetry, IV normal saline and __________  oxygen at 2  liters/minute via nasal cannula, nitroglycerin sublingual p.r.n. for chest  pain, nitroglycerin patch 0.2 mg/hour every 24 hours. Cardiology consult,  full liquid diet, Accu-Check a.c. and bedtime. Cardizem LA 240 mg p.o. every  day. Plavix 75 mg p.o. daily. Bentyl 20 mg p.o. daily. Tylenol  500 mg p.o.  q.6h. Fasting lipid profile, hemoglobin A1C, urinalysis, thyroid lab. Repeat  EKG in 24 hours. Repeat total CPK, CK-MB, and troponin I. Nexium 40 mg p.o.  daily, Xanax 0.5 mg p.o. at bedtime.                                               Annia Friendly. Loleta Chance, M.D.    Levonne Hubert  D:  09/23/2002  T:  09/23/2002  Job:  [id number]

## 2010-10-21 NOTE — Letter (Signed)
August 08, 2006    Charlotte Henry. Loleta Chance, MD  [address]  [place], [place] [address]   RE:  DEJANAE, Henry  MRN:  [id number]  /  DOB:  [birth year]   Dear Earvin Hansen:   Charlotte Henry returns to the office for continued management of coronary  disease and cardiovascular risk factors.  Since her last visit, she has  done quite well.  Her cough eventually proved to be due to an ACE  inhibitor which I was not aware she was taking but had been approved as  a substitution at the request of her pharmacist.  That resolved when she  resumed Diovan.  Diabetic control has been good with a hemoglobin A1c of  6.4.  Control of hypertension has been good.  She has had no chest pain  nor dyspnea.  She continues to have mild pedal edema, some coldness in  her feet, and some discomfort in her legs.   CURRENT MEDICATIONS:  1. Bentyl 20 mg t.i.d. p.r.n.  2. Diovan 160 mg daily.  3. Amaryl 4.5 mg daily.  4. Ezetimibe 10 mg daily.  5. Nexium 40 mg daily.   EXAMINATION:  Very pleasant woman in no acute distress.  The weight is  196, 4 pounds less than in January.  Blood pressure 130/80, heart rate  75 and regular, respirations 16.  NECK:  No jugular venous distention; no carotid bruits.  LUNGS:  Clear.  CARDIAC:  Normal first and second heart sounds, fourth heart sound  present.  ABDOMEN:  Soft and nontender; no organomegaly.  EXTREMITIES:  Trace edema; left great toe is improved with no  paronychia.   Chemistry profile was normal.  Lipid profile was suboptimal with a total  cholesterol of 242, LDL of 151, triglycerides of 126, and HDL of 66.   IMPRESSION:  Charlotte Henry is doing generally well.  We will substitute  simvastatin 40 mg daily for ezetimibe and recheck a lipid profile in one  month.  I have provided her with a prescription for Chantix and strongly  suggested that she discontinue cigarette smoking.  I will reassess this  nice woman again in 3 months.    Sincerely,      Gerrit Friends. Dietrich Pates, MD,  [facility]  Electronically Signed    RMR/MedQ  DD: 08/08/2006  DT: 08/08/2006  Job #: [id number]

## 2010-10-21 NOTE — Discharge Summary (Signed)
NAME:  Charlotte Henry, Charlotte Henry                         ACCOUNT NO.:  [id number]   MEDICAL RECORD NO.:  [id number]                   PATIENT TYPE:  INP   LOCATION:  A226                                 FACILITY:  [facility]   PHYSICIAN:  Annia Friendly. Loleta Chance, M.D.                DATE OF BIRTH:  [birth year]   DATE OF ADMISSION:  09/23/2002  DATE OF DISCHARGE:  09/24/2002                                 DISCHARGE SUMMARY   IDENTIFYING INFORMATION:  The patient is a 70 year old, widowed, gravida 3,  para 3, AB 0 employed black female  from [place], [place].   CHIEF COMPLAINT:  Crushing chest pain.   HISTORY OF PRESENT ILLNESS:  The pain started at midnight on 09/22/2002  while in bed.  The original pain was greater than 2 hours.  The patient took  several Tylenol for relief.  She described the pain radiating to the center  of the chest to the right shoulder and down the right arm. History was also  significant for nausea and diaphoresis with chest pain.  The patient denied  any experience of vomiting, dizziness, or shortness of breath particularly.  The second episode of crushing chest pain occurred at 1300 on ________ upon  arriving home from work.  Again, the pain was crushing in nature and it  radiated to the right shoulder and down the right arm.  At that time she  experienced sweating, nausea, and weakness.  The pain persisted for greater  than 2 hours.  Therefore, she contacted family members about the pain.  The  patient was transported to the ER by family members to [facility]  for evaluation.  She did not experience, again, shortness of breath with  chest pain.   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   ALLERGIES:  ________,  CODEINE, PENICILLIN, ASPIRIN, and NAPROSYN.   SOCIAL HISTORY:  Habits were positive for persistent severe smoking since a  teenager.  Positive for social use of ethanol.   FAMILY HISTORY:  Mother deceased at age 74 secondary to complications of  gastrointestinal bleed; father deceased age 28 secondary to cerebral  hemorrhage; 4 brothers deceased, all secondary to complications of liver  disease, secondary to cirrhosis; 2 sisters living age 92, good health and  age 52 with history of hypertension; 1 sister, deceased, in her 13s  secondary to complications of colon cancer; 1 adult son living, health is  good; 1 daughter living health unknown.   PHYSICAL EXAMINATION:  VITAL SIGNS:  In the emergency room revealed the  following: Temperature 98.8, respirations 20, blood pressure 176/101, and  pulse of 79.  GENERAL APPEARANCE:  Reveals a middle-age, medium-height, medium-framed,  slightly overweight,  black female in no apparent respiratory distress.  SKIN:  Warm and dry.  NECK:  Demonstrated no jugular venous distention.  LUNGS:  Positive for bilateral scattered rhonchi.  HEART:  Revealed audible S1, S2 without murmur, rub, or gallop.  Rhythm was  regular and rate was within normal limits.  ABDOMEN:  Abdomen was obese with hyperactive bowel sounds. Abdominal exam  was positive for old, healed, midhypogastric surgical scar.  Abdomen was  soft and nontender in all 4 quadrants.  Abdominal exam demonstrated no  palpable masses.  EXTREMITIES:  Demonstrated no tibial edema.  NEUROLOGIC:  Patient was neurologically intact.   X-RAY AND LABORATORY DATA:  Significant labs on admission were as follows:  Sodium 135, potassium 3.8, chloride 104, CO2 25, glucose 228, BUN 17,  creatinine 0.9, calcium 9.2, prothrombin time 13.0, INR 0.9, partial  thromboplastin time was 25, total CPK127, CK/MB 1.4, troponin I 0.03.  White  count 10.3, hemoglobin 12.5, hematocrit 37.5, platelets 279,000.   Chest x-ray  was read as low volume field with chronic insufficiency changes,  read out as no frank pulmonary edema or focal infiltrate.  EKG on 09/23/2002  was read as normal sinus rhythm with at rate of 75 and nonspecific  anterolateral T abnormalities.   COURSE IN HOSPITAL:  Problem 1: CHEST PAIN WITH MULTIPLE RISK FACTORS WITH  ABNORMAL EKG.  In the emergency room the patient was treated with oxygen at  1650, nitroglycerin 0.4 mg sublingual at 1705, Plavix 300 mg p.o. at 1707,  and repeat nitroglycerin 0.4 mg sublingual at 1735.  The patient was treated  for chest pain at 1910 on the medical floor.   The patient was admitted to telemetry.  She was treated with oxygen via  nasal cannula, bed rest, a clear liquid diet, nitroglycerin patch 0.2  mg/hour every 24 hours, Plavix 75 mg p.o. daily, nitroglycerin sublingual  p.r.n. for chest pain, order for repeat cardiac enzymes, cardiology consult,  Cardizem LA 240 mg p.o. daily, Plavix 75 mg p.o. daily, IV normal saline KVO  and other supportive measures.   The patient did experience recurrent chest pain during this hospitalization.  Cardiology did see the patient on 09/24/2002.  Dr. Daleen Squibb felt that the  patient needed a cardiac catheterization for further evaluation.  The  decision involved transfer to [facility] for cardiac  catheterization which was discussed with the patient.  The patient agreed,  and she was transferred on 08/24/2002 by ambulance.  The patient's  medications were changed by cardiology.  She was started on a heparin drip.  Moreover, she was given Lopressor 12.5 mg p.o. immediately followed by 12.5  p.o. b.i.d. Cardizem was decreased to 180 mg p.o. daily.   Repeat cardiac enzymes on 09/23/2002 at 1648 revealed the following: Total  CPK 127, CK/MB 1.4, troponin I 0.03, and on 09/24/2002 at 0600:  Total CPK  88, CK/MB 1.1, and troponin I 0.06.  Repeat EKG on 09/24/2002 at 0438 demonstrated sinus rhythm at a rate of 57,  inverted T waves diffusely,  indicating possible ischemia and poor R wave progression in leads V1-V2.   Problem 2:  TYPE 2 DIABETES MELLITUS.  Serum glucose on admission revealed a  value of 228.  Hemoglobin A1c was 8.5.  The patient was treated with a full  liquid diet on admission, frequent Accu-Checks and Amaryl 4 mg p.o. every  morning.  Fasting blood sugar on 09/24/2002 168.  The patient was alert and  oriented to person, place and  time.   Problem 3:  HYPERTENSION.  Hypertension was addressed with a low sodium diet  and antihypertensive medication.  Blood pressure on admission was 176/101.  Lungs demonstrated some crackles diffusely.  Heart exam was within normal  range.  Blood pressure on the morning of 09/24/2002 was 183/97.  The  patient's heart exam demonstrated no irregular heart beat at transfer.  However, she was experiencing recurrent chest pain and was the reason for  transfer for further evaluation at a tertiary center.   Problem 4:  PERIPHERAL VASCULAR DISEASE.  Examination of the lower  extremities demonstrated a nonpalpable left dorsalis pedis.  The patient had  a history of her lower extremities feeling cold.  She also experienced  chronic intermittent pain of the lower extremities.  She was on Pletal as  well as Plavix at the time of admission.  She had been evaluated, as an  outpatient, by the vascular surgeon pertaining to her peripheral vascular  disease.  She has been encouraged to stop smoking in the past  unsuccessfully.  She, again, will be encouraged to stop smoking and to  continue on p.o. medications to treat vascular disease.   Problem 5:  DIVERTICULOSIS.  The patient required no medical intervention  during this hospitalization.   Problem 6:  GASTROESOPHAGEAL REFLUX DISEASE.  The patient was continued on  Nexium 40 mg p.o. b.i.d.   Problem 7:  SPASMODIC COLON.  The patient was treated with Bentyl 20 mg p.o.  q.6h. During this hospitalization she was  not complaining of abdominal  cramping, bloating, and diarrhea.   Problem 8:  HYPERLIPIDEMIA.  The patient was treated with Lipitor 20 mg p.o.  at bedtime and full liquid diet on admission.  Fasting lipid profile  09/24/2002 at 0400 demonstrated the following:  Total cholesterol about 230  mg/dL, triglycerides 213 mg/dL, LDL 086 mg/dL and ACL 57 mg/dL.   Problem 9:  RIGHT FORAMINA DISK HERNIATION WITH MASS EXITING RIGHT C6.  The  patient only treated with Tylenol 500 mg p.o. q.6h. during her  hospitalization.   Problem 10:  BILATERAL CARPAL TUNNEL SYNDROME.  The patient received no  treatment.   DISCHARGE INSTRUCTIONS:  1. Diet:  Full liquids.  2. Activity:  Bed rest.   DISCHARGE MEDICATIONS:  1. Heparin drip.  2. Oxygen 2 liters per minute via nasal cannula.  3. Lopressor 12.5 mg p.o. b.i.d.  4. Diltiazem 480 mg p.o. daily.  5. Neurontin 300 mg p.o. at bedtime.  6. Combivent metered dose inhaler 2 puffs q.i.d. 7. Morphine 3 mg IV q.4-6h. p.r.n. for pain.  8. Tylenol 500 mg p.o. q.6h. p.r.n. for pain.  9. Amaryl 4 mg p.o. every morning.  10.      Lipitor 20 mg p.o. every bed time.  11.      Bentyl 20 mg p.o. q.6h.  12.      Nexium 40 mg p.o. daily.  13.      Plavix 75 mg p.o. daily.  14.      Nitroglycerin patch 0.2 mg per hour every 24 hours.  15.      Nitroglycerin tablet 1/150 one tablet sublingual p.r.n. for chest     pain.  16.      IV normal saline at [facility].   FINAL PRIMARY DIAGNOSIS:  Chest pain with multiple risk factors.   SECONDARY DIAGNOSES:  1. 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  2. Hypertension.  3. Peripheral vascular disease.  4. Hyperlipidemia.  5. Type 2  diabetes mellitus.  6. Spasmodic colon.  7. Gastroesophageal reflux disease.  8. Diverticulosis.  9. Right foraminal disk herniation with mass exiting right C6.  10.      Bilateral carpal tunnel syndrome.                                               Annia Friendly. Loleta Chance, M.D.     Levonne Hubert  D:  09/27/2002  T:  09/27/2002  Job:  [id number]

## 2010-11-19 ENCOUNTER — Other Ambulatory Visit: Payer: Self-pay | Admitting: Cardiology

## 2011-01-06 ENCOUNTER — Other Ambulatory Visit: Payer: Self-pay | Admitting: Cardiology

## 2011-02-02 ENCOUNTER — Encounter: Payer: Self-pay | Admitting: Vascular Surgery

## 2011-02-07 ENCOUNTER — Encounter: Payer: Self-pay | Admitting: Vascular Surgery

## 2011-02-08 ENCOUNTER — Ambulatory Visit (INDEPENDENT_AMBULATORY_CARE_PROVIDER_SITE_OTHER): Payer: Medicare Other | Admitting: Vascular Surgery

## 2011-02-08 ENCOUNTER — Encounter: Payer: Self-pay | Admitting: Vascular Surgery

## 2011-02-08 ENCOUNTER — Encounter (INDEPENDENT_AMBULATORY_CARE_PROVIDER_SITE_OTHER): Payer: Medicare Other

## 2011-02-08 VITALS — BP 159/77 | HR 50 | Ht 67.0 in | Wt 169.0 lb

## 2011-02-08 DIAGNOSIS — E1159 Type 2 diabetes mellitus with other circulatory complications: Secondary | ICD-10-CM

## 2011-02-08 DIAGNOSIS — I70219 Atherosclerosis of native arteries of extremities with intermittent claudication, unspecified extremity: Secondary | ICD-10-CM

## 2011-02-08 DIAGNOSIS — I739 Peripheral vascular disease, unspecified: Secondary | ICD-10-CM

## 2011-02-08 NOTE — Progress Notes (Signed)
CC: Follow up PVD  HPI: Charlotte Henry is a 70 y.o. female who I have been following with peripheral vascular disease.Last saw her one year ago. Since I saw her last she does continue to have stable claudication of both lower ferries. Her symptoms are more significant on the right side. Her symptoms are brought on by ambulation and relieved with rest. There are no other aggravating or alleviating factors. She's had no history of rest pain in either foot. She has had some paresthesias in the right foot for many months. She is not had any nonhealing ulcers.  Past Medical History  Diagnosis Date  . Hypertension   . ASCVD (arteriosclerotic cardiovascular disease)     Neg. stress nuc. 3/99; cath in 4/04,-80%LAD; 70% RCA, -> DESx2; residual 60% distal RCA; nl EF  . Hyperkalemia     potassium of 5.1-5.3 in 2011  . Diabetes mellitus     Treated with oral agents; HgbA1c of 7in 08/2008  . Gout   . GERD (gastroesophageal reflux disease)   . Diverticulosis   . Cholecystitis     anemia  . Hyperlipidemia   . Chronic kidney disease (CKD), stage III (moderate)     Creat.= 1.78 in 11/2009  . Hyperkalemia   . PVD (peripheral vascular disease)      moderate to severely decreased ABI's in 8/08, sig. aortic inflow disease; repeat ABI's in 2011 improved- mild disease on the left and moderate to severe disease on the right  . Cerebrovascular disease      mild; <50% ICA with moderate plaque bilaterally in 12/2009  . Asthmatic bronchitis   . Tobacco abuse     50 pack years continuing at 1/2 pack per day  . COPD (chronic obstructive pulmonary disease)   . CHF (congestive heart failure)   . Stroke   . Arthritis   . Anemia   . Bronchitis   . Leg pain     with walking  . Nervousness     FAMILY HISTORY: Family History  Problem Relation Age of Onset  . Liver disease Mother 62  . Cerebral aneurysm Father   . Aneurysm Father 22  . Cancer Sister   . Liver disease Brother   . Liver disease Brother   .  Liver disease Brother   . Diabetes type II Brother   . Alcohol abuse Brother   . Cancer Sister     SOCIAL HISTORY: History  Substance Use Topics  . Smoking status: Current Everyday Smoker -- 1.0 packs/day for 50 years    Types: Cigarettes  . Smokeless tobacco: Never Used  . Alcohol Use: 8.4 oz/week    14 Cans of beer per week    Allergies  Allergen Reactions  . Ace Inhibitors   . Aspirin   . Codeine   . Naprosyn (Naproxen)   . Varenicline Tartrate     Current Outpatient Prescriptions  Medication Sig Dispense Refill  . acetaminophen (TYLENOL EX ST ARTHRITIS PAIN) 500 MG tablet Take 500 mg by mouth every 6 (six) hours as needed.        . ALPRAZolam (XANAX) 0.25 MG tablet Take 0.25 mg by mouth at bedtime as needed.        Marland Kitchen aspirin 81 MG tablet Take 81 mg by mouth daily.        Marland Kitchen atorvastatin (LIPITOR) 40 MG tablet Take 40 mg by mouth daily.        . calcium citrate-vitamin D (CITRACAL+D) 315-200 MG-UNIT per tablet Take  1 tablet by mouth daily.        . Cranberry 405 MG CAPS Take 1 capsule by mouth daily.        Marland Kitchen DIOVAN 320 MG tablet TAKE ONE TABLET BY MOUTH ONCE DAILY.  30 each  10  . ferrous sulfate 325 (65 FE) MG tablet Take 325 mg by mouth daily with breakfast.        . gabapentin (NEURONTIN) 300 MG capsule Take 300 mg by mouth daily. Take 2 tablets at 10am and 5pm          . glimepiride (AMARYL) 4 MG tablet Take 4 mg by mouth daily before breakfast. Take 1 1/2 tablets      . NEXIUM 40 MG capsule TAKE 1 CAPSULE BY MOUTH  ONCE DAILY FOR ACIDREFLUX.  30 each  3  . PRAVACHOL 20 MG tablet TAKE 1 TABLET BY MOUTH   DAILY IN THE EVENING.  30 each  6  . vitamin B-12 (CYANOCOBALAMIN) 1000 MCG tablet Take 1,000 mcg by mouth daily.          REVIEW OF SYSTEMS: CARDIOVASCULAR: No chest pain, chest pressure, palpitations, orthopnea, or dyspnea on exertion. No history of DVT or phlebitis. PULMONARY: No productive cough, asthma, or wheezing. NEUROLOGIC: No focal weakness,  paresthesias, aphasia, amaurosis, or dizziness. HEMATOLOGIC: No bleeding problems or clotting disorders. MUSCULOSKELETAL: No joint pain or swelling. GASTROINTESTINAL: No blood in stool or hematemesis. GENITOURINARY: No dysuria or hematuria. PSYCHIATRIC: No history of major depression. INTEGUMENTARY: No rashes or ulcers. CONSTITUTIONAL: No fever or chills.  PHYSICAL EXAM: Filed Vitals:   02/08/11 1129  BP: 159/77  Pulse: 50   Body mass index is 26.47 kg/(m^2).  GENERAL: The patient appears their stated age. The vital signs are documented above. CARDIOVASCULAR: There is a regular rate and rhythm without significant murmur appreciated. I do not detect any carotid bruits. She has palpable femoral pulses bilaterally. I cannot palpate popliteal or pedal pulses on either side. She has no significant lower for many swelling. PULMONARY: There is good air exchange bilaterally. She does have Rales at both bases. ABDOMEN: Soft and non-tender with normal pitched bowel sounds.  MUSCULOSKELETAL: There are no major deformities or cyanosis. NEUROLOGIC: No focal weakness or paresthesias are detected. SKIN: There are no ulcers or rashes noted. PSYCHIATRIC: The patient has a normal affect.  DATA: 1. I have independently interpreted her arterial Doppler study today. She has an ABI of 48% on the right and 80% on the left. She has monophasic Doppler signals bilaterally in the dorsalis pedis and posterior tibial positions. I have compared this study to her previous study from one year ago. Her ABIs at that time were we 45% on the right and 74% on the left. Thus her ABIs are stable.  MEDICAL ISSUES: 1. this patient has stable bilateral infrainguinal arterial occlusive disease. Her symptoms have remained stable. Discussed with her the importance of tobacco cessation. Also encouraged her to remain as active as possible. Understands we would not consider revascularization unless she developed disabling  claudication rest pain or nonhealing ulcer. I have ordered followup ABIs for one year now see her back at that time. She knows to call sooner if she has problems.

## 2011-03-31 ENCOUNTER — Other Ambulatory Visit (HOSPITAL_COMMUNITY): Payer: Self-pay | Admitting: Family Medicine

## 2011-03-31 DIAGNOSIS — Z139 Encounter for screening, unspecified: Secondary | ICD-10-CM

## 2011-04-24 ENCOUNTER — Ambulatory Visit (HOSPITAL_COMMUNITY)
Admission: RE | Admit: 2011-04-24 | Discharge: 2011-04-24 | Disposition: A | Discharge status: Home / Self Care | Payer: Medicare Other | Source: Ambulatory Visit | Attending: Family Medicine | Admitting: Family Medicine

## 2011-04-24 DIAGNOSIS — Z1231 Encounter for screening mammogram for malignant neoplasm of breast: Secondary | ICD-10-CM | POA: Insufficient documentation

## 2011-04-24 DIAGNOSIS — Z139 Encounter for screening, unspecified: Secondary | ICD-10-CM

## 2011-06-05 ENCOUNTER — Other Ambulatory Visit: Payer: Self-pay | Admitting: Cardiology

## 2011-06-19 ENCOUNTER — Ambulatory Visit (INDEPENDENT_AMBULATORY_CARE_PROVIDER_SITE_OTHER): Payer: Medicare Other | Admitting: Cardiology

## 2011-06-19 ENCOUNTER — Encounter: Payer: Self-pay | Admitting: Cardiology

## 2011-06-19 VITALS — BP 137/76 | HR 55 | Ht 67.0 in | Wt 171.0 lb

## 2011-06-19 DIAGNOSIS — J45909 Unspecified asthma, uncomplicated: Secondary | ICD-10-CM

## 2011-06-19 DIAGNOSIS — E785 Hyperlipidemia, unspecified: Secondary | ICD-10-CM

## 2011-06-19 DIAGNOSIS — I10 Essential (primary) hypertension: Secondary | ICD-10-CM

## 2011-06-19 DIAGNOSIS — E119 Type 2 diabetes mellitus without complications: Secondary | ICD-10-CM

## 2011-06-19 DIAGNOSIS — R7301 Impaired fasting glucose: Secondary | ICD-10-CM | POA: Insufficient documentation

## 2011-06-19 DIAGNOSIS — E875 Hyperkalemia: Secondary | ICD-10-CM

## 2011-06-19 DIAGNOSIS — I739 Peripheral vascular disease, unspecified: Secondary | ICD-10-CM

## 2011-06-19 DIAGNOSIS — I679 Cerebrovascular disease, unspecified: Secondary | ICD-10-CM

## 2011-06-19 DIAGNOSIS — F17200 Nicotine dependence, unspecified, uncomplicated: Secondary | ICD-10-CM

## 2011-06-19 DIAGNOSIS — I2510 Atherosclerotic heart disease of native coronary artery without angina pectoris: Secondary | ICD-10-CM

## 2011-06-19 MED ORDER — EZETIMIBE 10 MG PO TABS: 10.0000 mg | ORAL_TABLET | Freq: Every day | ORAL | Status: DC

## 2011-06-19 MED ORDER — PRAVASTATIN SODIUM 40 MG PO TABS: 40.0000 mg | ORAL_TABLET | Freq: Every day | ORAL | Status: DC

## 2011-06-19 NOTE — Assessment & Plan Note (Signed)
Cerebrovascular disease remains asymptomatic.  Repeat carotid ultrasound will be appropriate within the next 12-18 months.

## 2011-06-19 NOTE — Assessment & Plan Note (Addendum)
Patient's motivation to discontinue tobacco use appears to be an inadequate for her to succeed at this task.

## 2011-06-19 NOTE — Assessment & Plan Note (Signed)
Diabetes is well controlled based upon an A1c level of 6.4.  Dr. Loleta Chance has effectively manage this problem and will continue to do so.

## 2011-06-19 NOTE — Patient Instructions (Signed)
Your physician recommends that you schedule a follow-up appointment in: 6 months  Your physician has recommended you make the following change in your medication:  INCREASE  Pravachol to 40 mg daily START Zetia 10 mg daily  Your physician recommends that you return for lab work in: 1 month (you will receive a letter in the mail)

## 2011-06-19 NOTE — Assessment & Plan Note (Signed)
Potassium has been less than 4.3 for the past year.  Hyperkalemia may have been medication induced and appears to have resolved.

## 2011-06-19 NOTE — Assessment & Plan Note (Signed)
Control of hyperlipidemia is suboptimal.  Pravastatin will be increased to 40 mg per day if tolerated and ezetimibe,10 mg per day, added to her regime.  Lipid profile will be reassessed in one month.

## 2011-06-19 NOTE — Assessment & Plan Note (Signed)
No symptoms to suggest myocardial ischemia at present.  We will continue to attempt to optimally control cardiovascular risk factors.

## 2011-06-19 NOTE — Progress Notes (Signed)
Patient ID: Charlotte Henry, female   DOB: 1940/10/13, 71 y.o.   MRN: [id number] HPI: Scheduled return visit for this very nice woman with widespread vascular disease and multiple risk factors.  Since her last visit, she has done well with stable class II dyspnea on exertion and nonlimiting claudication.  She has had no chest discomfort.  She denies orthopnea, PND, palpitations or syncope but notes minimal pedal edema.  Unfortunately, she continues to smoke cigarettes at the rate of one third pack per day.  Prior to Admission medications   Medication Sig Start Date End Date Taking? Authorizing Provider  acetaminophen (TYLENOL EX ST ARTHRITIS PAIN) 500 MG tablet Take 500 mg by mouth every 6 (six) hours as needed.     Yes Historical Provider, MD  ALPRAZolam Prudy Feeler) 0.25 MG tablet Take 0.25 mg by mouth at bedtime as needed.     Yes Historical Provider, MD  aspirin 81 MG tablet Take 81 mg by mouth daily.     Yes Historical Provider, MD  calcium citrate-vitamin D (CITRACAL+D) 315-200 MG-UNIT per tablet Take 1 tablet by mouth daily.     Yes Historical Provider, MD  Cranberry 405 MG CAPS Take 1 capsule by mouth daily.     Yes Historical Provider, MD  DIOVAN 320 MG tablet TAKE ONE TABLET BY MOUTH ONCE DAILY. 11/19/10  Yes Gerrit Friends. Iowa Kappes, MD  gabapentin (NEURONTIN) 300 MG capsule Take 300 mg by mouth daily. Take 2 tablets at 10am and 5pm       Yes Historical Provider, MD  glimepiride (AMARYL) 4 MG tablet Take 4 mg by mouth daily before breakfast. Take 1 1/2 tablets   Yes Historical Provider, MD  NEXIUM 40 MG capsule TAKE 1 CAPSULE BY MOUTH  ONCE DAILY FOR ACIDREFLUX. 10/08/10  Yes Gerrit Friends. Sarahi Borland, MD  PRAVACHOL 20 MG tablet TAKE 1 TABLET BY MOUTH DAILY IN THE EVENING. 06/05/11  Yes Gerrit Friends. Timisha Mondry, MD  vitamin B-12 (CYANOCOBALAMIN) 1000 MCG tablet Take 1,000 mcg by mouth daily.     Yes Historical Provider, MD   Allergies  Allergen Reactions  . Ace Inhibitors   . Aspirin   . Codeine   . Naprosyn  (Naproxen)   . Varenicline Tartrate   Past medical history, social history, and family history reviewed and updated.  ROS: See history of present illness.  PHYSICAL EXAM: BP 137/76  Pulse 55  Ht 5\' 7"  (1.702 m)  Wt 77.565 kg (171 lb)  BMI 26.78 kg/m2  General-Well developed; no acute distress Body habitus-mildly overweight Neck-No JVD; faint bilateral carotid bruits Lungs-few basilar rales; resonant to percussion Cardiovascular-normal PMI; normal S1 and S2; minimal basilar systolic murmur Abdomen-normal bowel sounds; soft and non-tender without masses or organomegaly Musculoskeletal-No deformities, no cyanosis or clubbing Neurologic-Normal cranial nerves; symmetric strength and tone Skin-Warm, no significant lesions Extremities-distal pulses decreased to absent; trace edema  ASSESSMENT AND PLAN:  Rattan Bing, MD 06/19/2011 3:13 PM

## 2011-06-19 NOTE — Assessment & Plan Note (Signed)
Creatinine now normal.  There does not appear to be significant chronic renal impairment.

## 2011-06-19 NOTE — Assessment & Plan Note (Signed)
Blood pressure is good at this visit.  Current medication will be continued.

## 2011-06-20 ENCOUNTER — Encounter: Payer: Self-pay | Admitting: *Deleted

## 2011-07-05 ENCOUNTER — Other Ambulatory Visit: Payer: Self-pay | Admitting: Cardiology

## 2011-07-07 ENCOUNTER — Other Ambulatory Visit: Payer: Self-pay | Admitting: Cardiology

## 2011-07-07 MED ORDER — ESOMEPRAZOLE MAGNESIUM 40 MG PO CPDR: 40.0000 mg | DELAYED_RELEASE_CAPSULE | Freq: Every day | ORAL | Status: DC

## 2011-07-07 NOTE — Telephone Encounter (Signed)
Pt was just seen 06/19/11, when she went to pick up rx she was told she needed to make appt. Please call rx in for her/tmj

## 2011-07-18 ENCOUNTER — Other Ambulatory Visit: Payer: Self-pay | Admitting: Cardiology

## 2011-11-02 ENCOUNTER — Other Ambulatory Visit: Payer: Self-pay | Admitting: Cardiology

## 2011-11-06 MED ORDER — VALSARTAN 320 MG PO TABS: 320.0000 mg | ORAL_TABLET | Freq: Every day | ORAL | Status: DC

## 2011-11-06 NOTE — Telephone Encounter (Signed)
Addended by: Marrion Coy L on: 11/06/2011 10:31 AM   Modules accepted: Orders

## 2011-12-04 ENCOUNTER — Ambulatory Visit (INDEPENDENT_AMBULATORY_CARE_PROVIDER_SITE_OTHER): Payer: Medicare Other | Admitting: Family Medicine

## 2011-12-04 ENCOUNTER — Encounter: Payer: Self-pay | Admitting: Family Medicine

## 2011-12-04 VITALS — BP 142/60 | HR 48 | Resp 18 | Ht 67.0 in | Wt 164.1 lb

## 2011-12-04 DIAGNOSIS — F17200 Nicotine dependence, unspecified, uncomplicated: Secondary | ICD-10-CM

## 2011-12-04 DIAGNOSIS — I739 Peripheral vascular disease, unspecified: Secondary | ICD-10-CM

## 2011-12-04 DIAGNOSIS — E785 Hyperlipidemia, unspecified: Secondary | ICD-10-CM

## 2011-12-04 DIAGNOSIS — N183 Chronic kidney disease, stage 3 unspecified: Secondary | ICD-10-CM

## 2011-12-04 DIAGNOSIS — I679 Cerebrovascular disease, unspecified: Secondary | ICD-10-CM

## 2011-12-04 DIAGNOSIS — R634 Abnormal weight loss: Secondary | ICD-10-CM

## 2011-12-04 DIAGNOSIS — I10 Essential (primary) hypertension: Secondary | ICD-10-CM

## 2011-12-04 DIAGNOSIS — E119 Type 2 diabetes mellitus without complications: Secondary | ICD-10-CM

## 2011-12-04 NOTE — Assessment & Plan Note (Signed)
Elevated blood pressure at today's visit her previous blood pressures have been optimal.

## 2011-12-04 NOTE — Assessment & Plan Note (Signed)
History of TIA per patient CT scan in 2001 and showed multiple lacunar infarcts. No residual deficits

## 2011-12-04 NOTE — Assessment & Plan Note (Signed)
Reviewed vascular surgeons last note. Reiterated importance of smoking cessation

## 2011-12-04 NOTE — Assessment & Plan Note (Addendum)
She was not aware of this. It was noted back in 2011 thought to be secondary to medication she was on we'll continue to follow

## 2011-12-04 NOTE — Assessment & Plan Note (Signed)
She's had 52 pound weight loss over the past year. I will obtain her PCP records regarding this. She said has had a colonoscopy in 2009. She is a heavy smoker. She is status post hysterectomy and gets mammograms on a regular basis which have been normal per report. Will have her followup after I have reviewed records. She will likely need CT scan of chest and abdomen for cancer workup

## 2011-12-04 NOTE — Progress Notes (Signed)
  Subjective:    Patient ID: Charlotte Henry, female    DOB: 03-Aug-1940, 71 y.o.   MRN: [id number]  HPI Patient here to establish care. Previous primary care provider Dr. Mirna Mires. Cardiologist Dr. Dietrich Pates ,  [facility] Dr. Edilia Bo, Medications and history reviewed Patient has a history of coronary artery disease as well as peripheral vascular disease she is followed by cardiology and vascular vein specialist. She does continue to smoke and has claudication symptoms as well as symptoms of neuropathy. She was diagnosed with diabetes mellitus in 2011 at that time she also had a CVA which she was told with multiple mini strokes. She does not have any residual deficits. She is more concerned about loosing weight she has lost 52 pounds over the past year unintentionally. She is a very poor appetite she only needs one meal a day she does take crackers with her meds. She denies any specific pain. She occasionally uses a laxative to move her bowels. She was prescribed Bentyl by her previous PCP a few months ago because she was having spasms with bowel movements however this has resolved. She did colonoscopy in 2009 which she states was normal.  She has a history of heavy alcohol use and tobacco use, continues to smoke 1/2ppd   She had labs done 2 weeks ago with previous PCP  Review of Systems   GEN- denies fatigue, fever,+ weight loss,weakness, recent illness HEENT- denies eye drainage, change in vision, nasal discharge, CVS- denies chest pain, palpitations RESP- denies SOB, cough, wheeze ABD- denies N/V, change in stools, abd pain GU- denies dysuria, hematuria, dribbling, incontinence MSK-+ joint pain, muscle aches, injury Neuro- denies headache, dizziness, syncope, seizure activity      Objective:   Physical Exam GEN- NAD, alert and oriented x3 HEENT- PERRL, EOMI, non injected sclera, pink conjunctiva, MMM, oropharynx clear, partials Neck- Supple,  CVS- bradycardic, no  murmur RESP-CTAB ABD-NABS,soft,NT,ND  EXT- No edema Pulses- Radial 2+, DP unable to appreciate Psych-normal mood and affect        Assessment & Plan:

## 2011-12-04 NOTE — Assessment & Plan Note (Signed)
She had labs recently done by PCP I will obtain these. She does not take her meds on  a regular basis. She states that she has too many hypoglycemic episode she only takes it if her blood sugars greater than 170.

## 2011-12-04 NOTE — Assessment & Plan Note (Signed)
At her last visit with cardiology pravastatin was increased and she was also put on steady at 10 mg. She will have followup lipid panel next week

## 2011-12-04 NOTE — Patient Instructions (Addendum)
I will get the records from Dr. Loleta Chance Continue current medications You need to quit smoking, it is bad for your lungs, your heart and your legs! F/U 8 weeks for the weight loss

## 2011-12-04 NOTE — Assessment & Plan Note (Signed)
She does not appear ready to quit smoking. She was tried on Chantix in the past and had reaction to this

## 2011-12-20 ENCOUNTER — Ambulatory Visit (INDEPENDENT_AMBULATORY_CARE_PROVIDER_SITE_OTHER): Payer: Medicare Other | Admitting: Cardiology

## 2011-12-20 ENCOUNTER — Encounter: Payer: Self-pay | Admitting: Cardiology

## 2011-12-20 VITALS — BP 112/20 | HR 72 | Ht 67.0 in | Wt 164.0 lb

## 2011-12-20 DIAGNOSIS — I10 Essential (primary) hypertension: Secondary | ICD-10-CM

## 2011-12-20 DIAGNOSIS — E119 Type 2 diabetes mellitus without complications: Secondary | ICD-10-CM

## 2011-12-20 DIAGNOSIS — K219 Gastro-esophageal reflux disease without esophagitis: Secondary | ICD-10-CM

## 2011-12-20 DIAGNOSIS — M109 Gout, unspecified: Secondary | ICD-10-CM

## 2011-12-20 DIAGNOSIS — E785 Hyperlipidemia, unspecified: Secondary | ICD-10-CM

## 2011-12-20 DIAGNOSIS — F17200 Nicotine dependence, unspecified, uncomplicated: Secondary | ICD-10-CM

## 2011-12-20 NOTE — Patient Instructions (Addendum)
Your physician recommends that you schedule a follow-up appointment in: 1 year  

## 2011-12-20 NOTE — Assessment & Plan Note (Signed)
Patient is congratulated on abstaining from tobacco use for 3 weeks and encouraged to continue use of inhaled nicotine.

## 2011-12-20 NOTE — Assessment & Plan Note (Signed)
All blood pressure determinations have been normal for the past 12 months.  Current therapy will be continued.

## 2011-12-20 NOTE — Progress Notes (Deleted)
Name: Charlotte Henry    DOB: 12-Dec-1940  Age: 71 y.o.  MR#: [id number]       PCP:  Milinda Antis, MD      Insurance: @PAYORNAME @   CC:    Chief Complaint  Patient presents with  . hypertension    No complaints - Med list/TC  . hyperlipidemia  . asvd    VS BP 112/20  Pulse 72  Ht 5\' 7"  (1.702 m)  Wt 164 lb (74.39 kg)  BMI 25.69 kg/m2  SpO2 97%  Weights Current Weight  12/20/11 164 lb (74.39 kg)  12/04/11 164 lb 1.9 oz (74.444 kg)  06/19/11 171 lb (77.565 kg)    Blood Pressure  BP Readings from Last 3 Encounters:  12/20/11 112/20  12/04/11 142/60  06/19/11 137/76     Admit date:  (Not on file) Last encounter with RMR:  11/02/2011   Allergy Allergies  Allergen Reactions  . Ace Inhibitors   . Aspirin   . Codeine   . Naprosyn (Naproxen)   . Varenicline Tartrate     Current Outpatient Prescriptions  Medication Sig Dispense Refill  . acetaminophen (TYLENOL EX ST ARTHRITIS PAIN) 500 MG tablet Take 500 mg by mouth every 6 (six) hours as needed.        . ALPRAZolam (XANAX) 0.25 MG tablet Take 0.25 mg by mouth at bedtime as needed.        Marland Kitchen aspirin 81 MG tablet Take 81 mg by mouth daily.        . calcium citrate-vitamin D (CITRACAL+D) 315-200 MG-UNIT per tablet Take 1 tablet by mouth daily.        . Cranberry 405 MG CAPS Take 1 capsule by mouth daily.        Marland Kitchen esomeprazole (NEXIUM) 40 MG capsule Take 1 capsule (40 mg total) by mouth daily.  90 capsule  3  . ezetimibe (ZETIA) 10 MG tablet Take 1 tablet (10 mg total) by mouth daily.  30 tablet  12  . gabapentin (NEURONTIN) 300 MG capsule Take 300 mg by mouth daily. Take 2 tablets at 10am and 5pm          . glimepiride (AMARYL) 4 MG tablet Take 6 mg by mouth daily before breakfast.       . pravastatin (PRAVACHOL) 40 MG tablet Take 1 tablet (40 mg total) by mouth daily.  30 tablet  12  . valsartan (DIOVAN) 320 MG tablet Take 1 tablet (320 mg total) by mouth daily.  30 tablet  6    Discontinued Meds:   There are no  discontinued medications.  Patient Active Problem List  Diagnosis  . AODM  . HYPERLIPIDEMIA  . GOUT, UNSPECIFIED  . HYPERKALEMIA  . TOBACCO ABUSE  . HYPERTENSION  . ATHEROSCLEROTIC CARDIOVASCULAR DISEASE  . CEREBROVASCULAR DISEASE  . Peripheral vascular disease  . Asthmatic bronchitis  . GERD  . DIVERTICULOSIS OF COLON  . Chronic kidney disease (CKD), stage III (moderate)  . Fasting hyperglycemia  . Weight loss    LABS No visits with results within 3 Month(s) from this visit. Latest known visit with results is:  Orders Only on 07/18/2011  Component Date Value  . Cholesterol 07/18/2011 131   . Triglycerides 07/18/2011 119   . HDL 07/18/2011 36*  . Total CHOL/HDL Ratio 07/18/2011 3.6   . VLDL 07/18/2011 24   . LDL Cholesterol 07/18/2011 71      Results for this Opt Visit:     Results for orders placed  in visit on 07/18/11  LIPID PANEL      Component Value Range   Cholesterol 131  0 - 200 mg/dL   Triglycerides 098  <119 mg/dL   HDL 36 (*) >14 mg/dL   Total CHOL/HDL Ratio 3.6     VLDL 24  0 - 40 mg/dL   LDL Cholesterol 71  0 - 99 mg/dL    EKG No orders found for this or any previous visit.   Prior Assessment and Plan Problem List as of 12/20/2011            Cardiology Problems   HYPERLIPIDEMIA   Last Assessment & Plan Note   12/04/2011 Office Visit Signed 12/04/2011 10:58 AM by Salley Scarlet, MD    At her last visit with cardiology pravastatin was increased and she was also put on steady at 10 mg. She will have followup lipid panel next week    HYPERTENSION   Last Assessment & Plan Note   12/04/2011 Office Visit Signed 12/04/2011 10:59 AM by Salley Scarlet, MD    Elevated blood pressure at today's visit her previous blood pressures have been optimal.    ATHEROSCLEROTIC CARDIOVASCULAR DISEASE   Last Assessment & Plan Note   06/19/2011 Office Visit Signed 06/19/2011  3:22 PM by Kathlen Brunswick, MD    No symptoms to suggest myocardial ischemia at present.  We  will continue to attempt to optimally control cardiovascular risk factors.    CEREBROVASCULAR DISEASE   Last Assessment & Plan Note   12/04/2011 Office Visit Signed 12/04/2011 11:00 AM by Salley Scarlet, MD    History of TIA per patient CT scan in 2001 and showed multiple lacunar infarcts. No residual deficits    Peripheral vascular disease   Last Assessment & Plan Note   12/04/2011 Office Visit Signed 12/04/2011 11:00 AM by Salley Scarlet, MD    Reviewed vascular surgeons last note. Reiterated importance of smoking cessation      Other   AODM   Last Assessment & Plan Note   12/04/2011 Office Visit Signed 12/04/2011 10:57 AM by Salley Scarlet, MD    She had labs recently done by PCP I will obtain these. She does not take her meds on  a regular basis. She states that she has too many hypoglycemic episode she only takes it if her blood sugars greater than 170.    GOUT, UNSPECIFIED   HYPERKALEMIA   Last Assessment & Plan Note   06/19/2011 Office Visit Signed 06/19/2011  4:43 PM by Kathlen Brunswick, MD    Potassium has been less than 4.3 for the past year.  Hyperkalemia may have been medication induced and appears to have resolved.    TOBACCO ABUSE   Last Assessment & Plan Note   12/04/2011 Office Visit Signed 12/04/2011 10:58 AM by Salley Scarlet, MD    She does not appear ready to quit smoking. She was tried on Chantix in the past and had reaction to this     Asthmatic bronchitis   GERD   DIVERTICULOSIS OF COLON   Chronic kidney disease (CKD), stage III (moderate)   Last Assessment & Plan Note   12/04/2011 Office Visit Addendum 12/04/2011 11:00 AM by Salley Scarlet, MD    She was not aware of this. It was noted back in 2011 thought to be secondary to medication she was on we'll continue to follow     Fasting hyperglycemia   Weight loss   Last  Assessment & Plan Note   12/04/2011 Office Visit Signed 12/04/2011 11:01 AM by Salley Scarlet, MD    She's had 52 pound weight loss over the past  year. I will obtain her PCP records regarding this. She said has had a colonoscopy in 2009. She is a heavy smoker. She is status post hysterectomy and gets mammograms on a regular basis which have been normal per report. Will have her followup after I have reviewed records. She will likely need CT scan of chest and abdomen for cancer workup        Imaging: No results found.   FRS Calculation: Score not calculated. Missing: Total Cholesterol

## 2011-12-20 NOTE — Assessment & Plan Note (Signed)
Patient reports CBGs all less than 140.  Diabetes appears to be very mild and easily controlled with a single oral agent.  Dr. Loleta Chance is monitoring this problem and adjusting therapy as necessary.

## 2011-12-20 NOTE — Assessment & Plan Note (Signed)
Hyperlipidemia has been under excellent control with current therapy, which will be continued.

## 2011-12-20 NOTE — Progress Notes (Signed)
Patient ID: Charlotte Henry, female   DOB: 1940/09/30, 71 y.o.   MRN: [id number]  HPI: Scheduled return visit for this very nice woman with coronary, cerebral and peripheral vascular disease plus multiple cardiovascular risk factors.  Despite the widespread nature of her vascular disease, she has done very well in recent years.  She has experienced no cardiovascular problems since undergoing multivessel percutaneous intervention a decade ago.  Peripheral vascular disease is monitored by Dr. Edilia Bo with a return visit anticipated within the next month or 2.  Patient has remained active, lost weight and controlled diabetes and hypertension.  Approximately 3 weeks ago, she started using electronic cigarettes and has not smoked since.  She notices no change in her mildly productive a.m cough nor any change in symptomatology.  Prior to Admission medications   Medication Sig Start Date End Date Taking? Authorizing Provider  acetaminophen (TYLENOL EX ST ARTHRITIS PAIN) 500 MG tablet Take 500 mg by mouth every 6 (six) hours as needed.     Yes Historical Provider, MD  ALPRAZolam Prudy Feeler) 0.25 MG tablet Take 0.25 mg by mouth at bedtime as needed.     Yes Historical Provider, MD  aspirin 81 MG tablet Take 81 mg by mouth daily.     Yes Historical Provider, MD  calcium citrate-vitamin D (CITRACAL+D) 315-200 MG-UNIT per tablet Take 1 tablet by mouth daily.     Yes Historical Provider, MD  Cranberry 405 MG CAPS Take 1 capsule by mouth daily.     Yes Historical Provider, MD  esomeprazole (NEXIUM) 40 MG capsule Take 1 capsule (40 mg total) by mouth daily. 07/07/11  Yes Kathlen Brunswick, MD  ezetimibe (ZETIA) 10 MG tablet Take 1 tablet (10 mg total) by mouth daily. 06/19/11 06/18/12 Yes Kathlen Brunswick, MD  gabapentin (NEURONTIN) 300 MG capsule Take 300 mg by mouth daily. Take 2 tablets at 10am and 5pm       Yes Historical Provider, MD  glimepiride (AMARYL) 4 MG tablet Take 6 mg by mouth daily before breakfast.    Yes  Historical Provider, MD  pravastatin (PRAVACHOL) 40 MG tablet Take 1 tablet (40 mg total) by mouth daily. 06/19/11 06/18/12 Yes Kathlen Brunswick, MD  valsartan (DIOVAN) 320 MG tablet Take 1 tablet (320 mg total) by mouth daily. 11/06/11  Yes Kathlen Brunswick, MD   Allergies  Allergen Reactions  . Ace Inhibitors   . Aspirin   . Codeine   . Naprosyn (Naproxen)   . Varenicline Tartrate      Past medical history, social history, and family history reviewed and updated.  ROS: Denies chest discomfort, orthopnea, PND, palpitations, lightheadedness or syncope.  She notes no pedal edema, but does have chronic class II dyspnea on exertion.  She coughs modestly in the morning upon awakening with production of scant amounts of sputum.  She has knee pain when she walks too far, but nothing that sounds like claudication.  All other systems reviewed and are negative.  PHYSICAL EXAM: BP 112/20  Pulse 72  Ht 5\' 7"  (1.702 m)  Wt 74.39 kg (164 lb)  BMI 25.69 kg/m2  SpO2 97%  General-Well developed; no acute distress Body habitus-proportionate weight and height Neck-No JVD; no carotid bruits Lungs-Mild expiratory rhonchi; resonant to percussion; prolonged expiratory phase Cardiovascular-normal PMI; normal S1 and S2; minimal basilar systolic murmur Abdomen-normal bowel sounds; soft and non-tender without masses or organomegaly Musculoskeletal-No deformities, no cyanosis or clubbing Neurologic-Normal cranial nerves; symmetric strength and tone Skin-Warm, no significant lesions  Extremities-Decreased distal pulses; no edema  ASSESSMENT AND PLAN:  Avon Bing, MD 12/20/2011 2:49 PM

## 2011-12-22 ENCOUNTER — Encounter: Payer: Self-pay | Admitting: Cardiology

## 2012-01-29 ENCOUNTER — Telehealth: Payer: Self-pay | Admitting: Family Medicine

## 2012-01-29 NOTE — Telephone Encounter (Signed)
Pt needs script for new meter and supplies sent to [employer] approved meter.

## 2012-01-29 NOTE — Telephone Encounter (Signed)
Order placed

## 2012-02-02 ENCOUNTER — Other Ambulatory Visit: Payer: Self-pay | Admitting: Family Medicine

## 2012-02-06 ENCOUNTER — Encounter: Payer: Self-pay | Admitting: Neurosurgery

## 2012-02-07 ENCOUNTER — Encounter (INDEPENDENT_AMBULATORY_CARE_PROVIDER_SITE_OTHER): Payer: Medicare Other | Admitting: *Deleted

## 2012-02-07 ENCOUNTER — Encounter: Payer: Self-pay | Admitting: Neurosurgery

## 2012-02-07 ENCOUNTER — Ambulatory Visit (INDEPENDENT_AMBULATORY_CARE_PROVIDER_SITE_OTHER): Payer: Medicare Other | Admitting: Neurosurgery

## 2012-02-07 VITALS — BP 150/78 | HR 43 | Resp 14 | Ht 67.0 in | Wt 166.0 lb

## 2012-02-07 DIAGNOSIS — I739 Peripheral vascular disease, unspecified: Secondary | ICD-10-CM

## 2012-02-07 DIAGNOSIS — I70219 Atherosclerosis of native arteries of extremities with intermittent claudication, unspecified extremity: Secondary | ICD-10-CM

## 2012-02-07 NOTE — Addendum Note (Signed)
Addended by: Sharee Pimple on: 02/07/2012 10:56 AM   Modules accepted: Orders

## 2012-02-07 NOTE — Progress Notes (Signed)
[facility]/PVD Office Note  CC: Yearly PVD surveillance Referring Physician: Edilia Bo  History of Present Illness: 71 year old female patient of Dr. Edilia Bo seen for a history of PVD without true claudication or rest pain. The patient has no open ulcerations on her lower extremities. The patient states that she is able to exercise she also takes part in several activities without any lower extremity discomfort.  Past Medical History  Diagnosis Date  . Hypertension   . ASCVD (arteriosclerotic cardiovascular disease)     Neg. stress nuc. 3/99; cath in 4/04,-80%LAD; 70% RCA, -> DESx2; residual 60% distal RCA; nl EF  . Hyperkalemia     potassium of 5.1-5.3 in 2011  . Diabetes mellitus     Treated with oral agents; HgbA1c of 7in 08/2008  . Gout   . GERD (gastroesophageal reflux disease)   . Diverticulosis   . Cholecystitis     anemia  . Hyperlipidemia   . Chronic kidney disease (CKD), stage III (moderate)     Creat.= 1.78 in 11/2009  . PVD (peripheral vascular disease)      moderate to severely decreased ABI's in 8/08, sig. aortic inflow disease; repeat ABI's in 2011 improved- mild disease on the left and moderate to severe disease on the right  . Cerebrovascular disease      mild; <50% ICA with moderate plaque bilaterally in 12/2009  . Asthmatic bronchitis   . Tobacco abuse     50 pack years continuing at 1/2 pack per day  . COPD (chronic obstructive pulmonary disease)   . CHF (congestive heart failure)   . Arthritis   . Anemia   . Bronchitis   . Leg pain     with walking  . Nervousness   . Stroke 2012    Mini strokes- Temporary  blindness  . Myocardial infarction   . Coronary disease   . Arterial fibromuscular dysplasia     ROS: [x]  Positive   [ ]  Denies    General: [ ]  Weight loss, [ ]  Fever, [ ]  chills Neurologic: [ ]  Dizziness, [ ]  Blackouts, [ ]  Seizure [ ]  Stroke, [ ]  "Mini stroke", [ ]  Slurred speech, [ ]  Temporary blindness; [ ]   weakness in arms or legs, [ ]  Hoarseness Cardiac: [ ]  Chest pain/pressure, [ ]  Shortness of breath at rest [ ]  Shortness of breath with exertion, [ ]  Atrial fibrillation or irregular heartbeat Vascular: [ ]  Pain in legs with walking, [ ]  Pain in legs at rest, [ ]  Pain in legs at night,  [ ]  Non-healing ulcer, [ ]  Blood clot in vein/DVT,   Pulmonary: [ ]  Home oxygen, [ ]  Productive cough, [ ]  Coughing up blood, [ ]  Asthma,  [ ]  Wheezing Musculoskeletal:  [ ]  Arthritis, [ ]  Low back pain, [ ]  Joint pain Hematologic: [ ]  Easy Bruising, [ ]  Anemia; [ ]  Hepatitis Gastrointestinal: [ ]  Blood in stool, [ ]  Gastroesophageal Reflux/heartburn, [ ]  Trouble swallowing Urinary: [ ]  chronic Kidney disease, [ ]  on HD - [ ]  MWF or [ ]  TTHS, [ ]  Burning with urination, [ ]  Difficulty urinating Skin: [ ]  Rashes, [ ]  Wounds Psychological: [ ]  Anxiety, [ ]  Depression   Social History History  Substance Use Topics  . Smoking status: Current Everyday Smoker -- 1.0 packs/day for 50 years    Types: Cigarettes  . Smokeless tobacco: Never Used  . Alcohol Use: 8.4 oz/week    14 Cans of beer per  week    Family History Family History  Problem Relation Age of Onset  . Liver disease Mother 56  . Hypertension Mother   . Cerebral aneurysm Father   . Aneurysm Father 7  . Cancer Sister   . Liver disease Brother   . Liver disease Brother   . Liver disease Brother   . Diabetes type II Brother   . Alcohol abuse Brother   . Cancer Sister     Allergies  Allergen Reactions  . Plasticized Base (Plastibase) Hives and Swelling  . Adhesive (Tape) Swelling and Rash  . Ace Inhibitors   . Aspirin   . Codeine   . Naprosyn (Naproxen)   . Varenicline Tartrate     Current Outpatient Prescriptions  Medication Sig Dispense Refill  . acetaminophen (TYLENOL EX ST ARTHRITIS PAIN) 500 MG tablet Take 500 mg by mouth every 6 (six) hours as needed.        . ALPRAZolam (XANAX) 0.25 MG tablet Take 0.25 mg by mouth at  bedtime as needed.        Marland Kitchen aspirin 81 MG tablet Take 81 mg by mouth daily.        . calcium citrate-vitamin D (CITRACAL+D) 315-200 MG-UNIT per tablet Take 1 tablet by mouth daily.        . Cranberry 405 MG CAPS Take 1 capsule by mouth daily.        Marland Kitchen esomeprazole (NEXIUM) 40 MG capsule Take 1 capsule (40 mg total) by mouth daily.  90 capsule  3  . ezetimibe (ZETIA) 10 MG tablet Take 1 tablet (10 mg total) by mouth daily.  30 tablet  12  . glimepiride (AMARYL) 4 MG tablet Take 6 mg by mouth daily before breakfast.       . NEURONTIN 300 MG capsule TAKE 2 CAPSULE BY MOUTH EVERY 8 HOURS FOR LEG PAIN.  180 each  2  . pravastatin (PRAVACHOL) 40 MG tablet Take 1 tablet (40 mg total) by mouth daily.  30 tablet  12  . valsartan (DIOVAN) 320 MG tablet Take 1 tablet (320 mg total) by mouth daily.  30 tablet  6    Physical Examination  Filed Vitals:   02/07/12 0931  BP: 150/78  Pulse: 43  Resp: 14    Body mass index is 26.00 kg/(m^2).  General:  WDWN in NAD Gait: Normal HEENT: WNL Eyes: Pupils equal Pulmonary: normal non-labored breathing , without Rales, rhonchi,  wheezing Cardiac: RRR, without  Murmurs, rubs or gallops; No carotid bruits Abdomen: soft, NT, no masses Skin: no rashes, ulcers noted Vascular Exam/Pulses: Lower extremity pulses are not palpable however her legs and feet are warm and well-perfused  Extremities without ischemic changes, no Gangrene , no cellulitis; no open wounds;  Musculoskeletal: no muscle wasting or atrophy  Neurologic: A&O X 3; Appropriate Affect ; SENSATION: normal; MOTOR FUNCTION:  moving all extremities equally. Speech is fluent/normal  Non-Invasive Vascular Imaging: ABIs today are 0.48 on the right and monophasic, for 64 and monophasic on the left which is consistent with previous exam one year ago when she was 0.48 on the right and 0.80 on the left  ASSESSMENT/PLAN: So patient with moderate occlusive disease without claudication or rest pain. The  patient no longer takes her Pletal. The patient will followup in one year with repeat ABIs, her questions were encouraged and answered, she is in agreement with this plan. Next  Lauree Chandler ANP  M.D.: Edilia Bo

## 2012-02-12 ENCOUNTER — Ambulatory Visit: Payer: Medicare Other | Admitting: Family Medicine

## 2012-03-04 ENCOUNTER — Ambulatory Visit (INDEPENDENT_AMBULATORY_CARE_PROVIDER_SITE_OTHER): Payer: Medicare Other | Admitting: Family Medicine

## 2012-03-04 ENCOUNTER — Encounter: Payer: Self-pay | Admitting: Family Medicine

## 2012-03-04 VITALS — BP 132/80 | HR 54 | Resp 15 | Ht 67.0 in | Wt 166.4 lb

## 2012-03-04 DIAGNOSIS — I10 Essential (primary) hypertension: Secondary | ICD-10-CM

## 2012-03-04 DIAGNOSIS — M25569 Pain in unspecified knee: Secondary | ICD-10-CM

## 2012-03-04 DIAGNOSIS — F17200 Nicotine dependence, unspecified, uncomplicated: Secondary | ICD-10-CM

## 2012-03-04 DIAGNOSIS — Z23 Encounter for immunization: Secondary | ICD-10-CM

## 2012-03-04 DIAGNOSIS — E119 Type 2 diabetes mellitus without complications: Secondary | ICD-10-CM

## 2012-03-04 LAB — CBC
Hemoglobin: 11.7 g/dL — ABNORMAL LOW (ref 12.0–15.0)
MCHC: 32.2 g/dL (ref 30.0–36.0)
WBC: 8.2 10*3/uL (ref 4.0–10.5)

## 2012-03-04 MED ORDER — LIDOCAINE 5 % EX PTCH: 1.0000 | MEDICATED_PATCH | CUTANEOUS | Status: DC

## 2012-03-04 MED ORDER — DICLOFENAC SODIUM 1 % TD GEL: TRANSDERMAL | Status: DC

## 2012-03-04 NOTE — Patient Instructions (Addendum)
Use ice on your knee  Flu shot given  Voltaren gel twice a day  Use the Lidoderm patch If you do not improve, please call for appt with Dr. Romeo Apple  Work on the smoking! Tylenol arthritis to have at home F/U 4 months

## 2012-03-05 DIAGNOSIS — Z23 Encounter for immunization: Secondary | ICD-10-CM

## 2012-03-05 LAB — COMPREHENSIVE METABOLIC PANEL
AST: 17 U/L (ref 0–37)
Albumin: 4.3 g/dL (ref 3.5–5.2)
BUN: 18 mg/dL (ref 6–23)
CO2: 28 mEq/L (ref 19–32)
Calcium: 9.8 mg/dL (ref 8.4–10.5)
Chloride: 106 mEq/L (ref 96–112)
Glucose, Bld: 97 mg/dL (ref 70–99)
Potassium: 5.1 mEq/L (ref 3.5–5.3)

## 2012-03-05 LAB — HEMOGLOBIN A1C
Hgb A1c MFr Bld: 6.7 % — ABNORMAL HIGH (ref <5.7)
Mean Plasma Glucose: 146 mg/dL — ABNORMAL HIGH (ref <117)

## 2012-03-06 DIAGNOSIS — M25569 Pain in unspecified knee: Secondary | ICD-10-CM | POA: Insufficient documentation

## 2012-03-06 NOTE — Assessment & Plan Note (Signed)
BP looks good today, no further changes,  Records requested again from previous PCP

## 2012-03-06 NOTE — Assessment & Plan Note (Signed)
Counseled on cessation, she stopped electronic cigarrette and returned to her previous habits

## 2012-03-06 NOTE — Progress Notes (Signed)
  Subjective:    Patient ID: ARMIDA TEBAY, female    DOB: [birth year], 71 y.o.   MRN: [id number]  HPI Left knee swelling and pain for past week, has used lidoderm patch but now out, those helped. She has seen ortho in the past, no fall, no specific injury. Otherwise feels well. Swelling has improved greatly. SHe has knee brace at home    Review of Systems  GEN- denies fatigue, fever, weight loss,weakness, recent illness HEENT- denies eye drainage, change in vision, nasal discharge, CVS- denies chest pain, palpitations RESP- denies SOB, cough, wheeze MSK- + joint pain, muscle aches, injury Neuro- denies headache, dizziness, syncope, seizure activity      Objective:   Physical Exam  GEN- NAD, alert and oriented x3, walking with cane CVS- bradycardic, no murmur RESP-CTAB EXT- left knee + effusion, decreased ROM, ligaments appear in tact, antalgic gait, TTP medial and lateral joint line right knee good ROM, no effusion Pulses- Radial 2+, DP unable to appreciate       Assessment & Plan:

## 2012-03-06 NOTE — Assessment & Plan Note (Signed)
She declines ortho referral at this time, swelling has improved over the week. Voltaren gel and lidoderm patches prescribed for joint pain. She does not tolerate oral anti-inflammatories

## 2012-03-08 ENCOUNTER — Telehealth: Payer: Self-pay | Admitting: Family Medicine

## 2012-03-08 NOTE — Telephone Encounter (Signed)
Patient aware of results.

## 2012-03-21 ENCOUNTER — Encounter: Payer: Self-pay | Admitting: Family Medicine

## 2012-03-21 ENCOUNTER — Other Ambulatory Visit: Payer: Self-pay | Admitting: Family Medicine

## 2012-03-21 ENCOUNTER — Ambulatory Visit (HOSPITAL_COMMUNITY)
Admission: RE | Admit: 2012-03-21 | Discharge: 2012-03-21 | Disposition: A | Discharge status: Home / Self Care | Payer: Medicare Other | Source: Ambulatory Visit | Attending: Family Medicine | Admitting: Family Medicine

## 2012-03-21 ENCOUNTER — Ambulatory Visit (INDEPENDENT_AMBULATORY_CARE_PROVIDER_SITE_OTHER): Payer: Medicare Other | Admitting: Family Medicine

## 2012-03-21 VITALS — BP 120/78 | HR 62 | Resp 15 | Wt 163.1 lb

## 2012-03-21 DIAGNOSIS — K5900 Constipation, unspecified: Secondary | ICD-10-CM

## 2012-03-21 DIAGNOSIS — M25559 Pain in unspecified hip: Secondary | ICD-10-CM | POA: Insufficient documentation

## 2012-03-21 DIAGNOSIS — R109 Unspecified abdominal pain: Secondary | ICD-10-CM | POA: Insufficient documentation

## 2012-03-21 DIAGNOSIS — M25569 Pain in unspecified knee: Secondary | ICD-10-CM | POA: Insufficient documentation

## 2012-03-21 DIAGNOSIS — Z139 Encounter for screening, unspecified: Secondary | ICD-10-CM

## 2012-03-21 NOTE — Patient Instructions (Signed)
Get the xrays done Take the tylenol twice a day Use Miralax to help your bowel move  I will with results

## 2012-03-22 NOTE — Progress Notes (Signed)
  Subjective:    Patient ID: Charlotte Henry, female    DOB: [birth year], 71 y.o.   MRN: [id number]  HPI Right hip pain and side pain for the past week. She denies any injury. She states it hurts worse when she has a bowel movement has been constipated some. The pain goes down her right side into the hip. Denies any falls. She also still has left knee pain.   Review of Systems   GEN- denies fatigue, fever, weight loss,weakness, recent illness HEENT- denies eye drainage, change in vision, nasal discharge, CVS- denies chest pain, palpitations RESP- denies SOB, cough, wheeze ABD- denies N/V, +change in stools, abd pain GU- denies dysuria, hematuria, dribbling, incontinence MSK- + joint pain, muscle aches, injury Neuro- denies headache, dizziness, syncope, seizure activity      Objective:   Physical Exam   GEN- NAD, alert and oriented x3, walking with cane HEENT-PERRL, EOMI, oropharynx clear, MMM CVS- bradycardic, no murmur RESP-CTAB ABD-NABS,soft,NT,ND Back- spine Non tender HIP- mild pain with ROM EXT- left knee no effusion, decreased ROM, ligaments appear in tact, antalgic gait, TTP medial and lateral joint line right knee good ROM, no effusion Pulses- Radial 2+,       Assessment & Plan:

## 2012-03-24 DIAGNOSIS — K5900 Constipation, unspecified: Secondary | ICD-10-CM | POA: Insufficient documentation

## 2012-03-24 DIAGNOSIS — M25559 Pain in unspecified hip: Secondary | ICD-10-CM | POA: Insufficient documentation

## 2012-03-24 NOTE — Assessment & Plan Note (Signed)
Effusion improved, using lidoderm , obtain xray, pt still wants to wait on ortho for now

## 2012-03-24 NOTE — Assessment & Plan Note (Signed)
Differentials due to constipation , OA of hip or from over use of right side due to left knee pain and compensation. Xray of hip/pelvis done, no falls, she still declines ortho at this time

## 2012-03-25 ENCOUNTER — Other Ambulatory Visit: Payer: Self-pay | Admitting: *Deleted

## 2012-03-25 MED ORDER — VALSARTAN 320 MG PO TABS: 320.0000 mg | ORAL_TABLET | Freq: Every day | ORAL | Status: DC

## 2012-03-25 MED ORDER — PRAVASTATIN SODIUM 40 MG PO TABS: 40.0000 mg | ORAL_TABLET | Freq: Every day | ORAL | Status: DC

## 2012-04-25 ENCOUNTER — Ambulatory Visit (HOSPITAL_COMMUNITY)
Admission: RE | Admit: 2012-04-25 | Discharge: 2012-04-25 | Disposition: A | Discharge status: Home / Self Care | Payer: Medicare Other | Source: Ambulatory Visit | Attending: Family Medicine | Admitting: Family Medicine

## 2012-04-25 DIAGNOSIS — Z139 Encounter for screening, unspecified: Secondary | ICD-10-CM

## 2012-04-25 DIAGNOSIS — Z1231 Encounter for screening mammogram for malignant neoplasm of breast: Secondary | ICD-10-CM | POA: Insufficient documentation

## 2012-06-20 ENCOUNTER — Ambulatory Visit: Payer: Medicare Other | Admitting: Adult Health

## 2012-07-01 ENCOUNTER — Encounter: Payer: Self-pay | Admitting: Family Medicine

## 2012-07-01 ENCOUNTER — Ambulatory Visit (INDEPENDENT_AMBULATORY_CARE_PROVIDER_SITE_OTHER): Payer: Medicare Other | Admitting: Family Medicine

## 2012-07-01 VITALS — BP 96/62 | HR 64 | Resp 18 | Ht 67.0 in | Wt 159.0 lb

## 2012-07-01 DIAGNOSIS — E119 Type 2 diabetes mellitus without complications: Secondary | ICD-10-CM

## 2012-07-01 DIAGNOSIS — I739 Peripheral vascular disease, unspecified: Secondary | ICD-10-CM

## 2012-07-01 DIAGNOSIS — I10 Essential (primary) hypertension: Secondary | ICD-10-CM

## 2012-07-01 DIAGNOSIS — E785 Hyperlipidemia, unspecified: Secondary | ICD-10-CM

## 2012-07-01 DIAGNOSIS — R059 Cough, unspecified: Secondary | ICD-10-CM

## 2012-07-01 DIAGNOSIS — G629 Polyneuropathy, unspecified: Secondary | ICD-10-CM

## 2012-07-01 DIAGNOSIS — R05 Cough: Secondary | ICD-10-CM

## 2012-07-01 DIAGNOSIS — G609 Hereditary and idiopathic neuropathy, unspecified: Secondary | ICD-10-CM

## 2012-07-01 DIAGNOSIS — F17200 Nicotine dependence, unspecified, uncomplicated: Secondary | ICD-10-CM

## 2012-07-01 DIAGNOSIS — N183 Chronic kidney disease, stage 3 unspecified: Secondary | ICD-10-CM

## 2012-07-01 DIAGNOSIS — G4700 Insomnia, unspecified: Secondary | ICD-10-CM

## 2012-07-01 LAB — LIPID PANEL
Cholesterol: 132 mg/dL (ref 0–200)
LDL Cholesterol: 71 mg/dL (ref 0–99)
Total CHOL/HDL Ratio: 3.3 Ratio
Triglycerides: 107 mg/dL (ref <150)
VLDL: 21 mg/dL (ref 0–40)

## 2012-07-01 LAB — COMPREHENSIVE METABOLIC PANEL
Alkaline Phosphatase: 50 U/L (ref 39–117)
CO2: 27 mEq/L (ref 19–32)
Creat: 1.11 mg/dL — ABNORMAL HIGH (ref 0.50–1.10)
Glucose, Bld: 94 mg/dL (ref 70–99)
Sodium: 142 mEq/L (ref 135–145)
Total Bilirubin: 0.5 mg/dL (ref 0.3–1.2)
Total Protein: 6.8 g/dL (ref 6.0–8.3)

## 2012-07-01 LAB — CBC
Hemoglobin: 12.2 g/dL (ref 12.0–15.0)
MCH: 29.8 pg (ref 26.0–34.0)
MCHC: 33.1 g/dL (ref 30.0–36.0)
MCV: 90 fL (ref 78.0–100.0)
Platelets: 265 10*3/uL (ref 150–400)
RBC: 4.1 MIL/uL (ref 3.87–5.11)

## 2012-07-01 LAB — HEMOGLOBIN A1C: Hgb A1c MFr Bld: 6.8 % — ABNORMAL HIGH (ref <5.7)

## 2012-07-01 MED ORDER — VALSARTAN 160 MG PO TABS: 320.0000 mg | ORAL_TABLET | Freq: Every day | ORAL | Status: DC

## 2012-07-01 MED ORDER — ALPRAZOLAM 0.25 MG PO TABS: 0.2500 mg | ORAL_TABLET | Freq: Every evening | ORAL | Status: DC | PRN

## 2012-07-01 MED ORDER — BENZONATATE 100 MG PO CAPS: 100.0000 mg | ORAL_CAPSULE | Freq: Three times a day (TID) | ORAL | Status: DC | PRN

## 2012-07-01 NOTE — Assessment & Plan Note (Signed)
Send for her fasting labs renal function we will recheck

## 2012-07-01 NOTE — Assessment & Plan Note (Signed)
I will increase her Neurontin to 300 mg a day and 600 at bedtime she will let me know if this is too sedating for her during the day

## 2012-07-01 NOTE — Assessment & Plan Note (Addendum)
She's not taking her medications and she is low normal tensive. I will cut her Diovan back to 160 mg. She's concerned she is taking too many medications in general She has a blood pressure cuff at home she will monitor this and let me know what her blood pressures are doing

## 2012-07-01 NOTE — Progress Notes (Signed)
  Subjective:    Patient ID: Charlotte Henry, female    DOB: 1941/03/17, 72 y.o.   MRN: [id number]  HPI Patient here to follow up chronic medical problems. She has multiple concerns Osteoarthritis of knee she was asking for specialized brace where she was in some paperwork through the mail she uses a stabilizer which is thought over-the-counter Diabetes mellitus she stopped her Amaryl in October because her fasting blood sugars were 55-60 however she did not notify the office. Peripheral vascular disease she continues to have pain with walking and numbness and tingling as well however she states it is no worse than usual therefore has not called her vascular surgeon. She was last seen in September by there office Insomnia-she's run out of her Xanax which she uses 25 mg at bedtime to help her sleep. She is cared for 2 family members one recently diagnosed with oropharyngeal cancer and has been in the nursing home and is now under her care. She states that she has a lot going on and has not been able to come in but this medication does help her sleep She's had a cough since December is improving but now she brings that clear mucus. She denies any fever shortness of breath or chest pain she had been using over-the-counter Robitussin but it made her stomach hurt. Yesterday she had diarrhea but this is now resolved.  Review of Systems   GEN- denies fatigue, fever, weight loss,weakness, recent illness HEENT- denies eye drainage, change in vision, nasal discharge, CVS- denies chest pain, palpitations RESP- denies SOB, cough, wheeze ABD- denies N/V, change in stools, abd pain GU- denies dysuria, hematuria, dribbling, incontinence MSK- + joint pain, muscle aches, injury Neuro- denies headache, dizziness, syncope, seizure activity      Objective:   Physical Exam GEN- NAD, alert and oriented x3 HEENT- PERRL, EOMI, non injected sclera, pink conjunctiva, MMM, oropharynx clear Neck- Supple, no LAD CVS-  RRR, no murmur RESP-CTAB ABD-NABS,soft,NT,ND EXT- No edema Pulses- Radial 2+, No  DP felt Right foot, dimished DP, PT left  See diabetic foot        Assessment & Plan:

## 2012-07-01 NOTE — Assessment & Plan Note (Signed)
Her symptoms have not worsened. I will have her follow with her vascular surgeon.

## 2012-07-01 NOTE — Assessment & Plan Note (Signed)
Recheck fasting lipid panel she is on Pravachol and zetia

## 2012-07-01 NOTE — Assessment & Plan Note (Signed)
She's having hypoglycemia on Amaryl like removed discontinue at this time. I will check her A1c she may not need any medications

## 2012-07-01 NOTE — Assessment & Plan Note (Addendum)
Respiratory exam looks very good no signs of hypoxia she will be started on Tessalon Perles as needed for cough. iF She worsen I will obtain a chest x-ray

## 2012-07-01 NOTE — Assessment & Plan Note (Signed)
Low-dose Xanax for that and stress. She will continue at bedtime as needed

## 2012-07-01 NOTE — Assessment & Plan Note (Signed)
She is going to try the electronic cigarette again

## 2012-07-01 NOTE — Patient Instructions (Addendum)
Cough medicine sent Get labs done fasting Increase neurontin to 1 tablet in morning 2 in evening Blood pressure medication decreased to 160mg  Do not take diabetes medication Make appointment with Dr. Edilia Bo Happy Birthday! Call if blood pressure running high F/U 3 months

## 2012-07-08 ENCOUNTER — Telehealth: Payer: Self-pay | Admitting: Family Medicine

## 2012-07-10 ENCOUNTER — Other Ambulatory Visit: Payer: Self-pay | Admitting: Cardiology

## 2012-07-10 NOTE — Progress Notes (Signed)
Called pt and no option to leave message

## 2012-07-12 NOTE — Telephone Encounter (Signed)
Patient received lab results.

## 2012-07-19 ENCOUNTER — Other Ambulatory Visit: Payer: Self-pay | Admitting: Cardiology

## 2012-09-30 ENCOUNTER — Encounter: Payer: Self-pay | Admitting: Family Medicine

## 2012-09-30 ENCOUNTER — Ambulatory Visit (INDEPENDENT_AMBULATORY_CARE_PROVIDER_SITE_OTHER): Payer: Medicare Other | Admitting: Family Medicine

## 2012-09-30 VITALS — BP 118/70 | HR 60 | Resp 16 | Wt 160.4 lb

## 2012-09-30 DIAGNOSIS — I2510 Atherosclerotic heart disease of native coronary artery without angina pectoris: Secondary | ICD-10-CM

## 2012-09-30 DIAGNOSIS — E119 Type 2 diabetes mellitus without complications: Secondary | ICD-10-CM

## 2012-09-30 DIAGNOSIS — G609 Hereditary and idiopathic neuropathy, unspecified: Secondary | ICD-10-CM

## 2012-09-30 DIAGNOSIS — G629 Polyneuropathy, unspecified: Secondary | ICD-10-CM

## 2012-09-30 DIAGNOSIS — I10 Essential (primary) hypertension: Secondary | ICD-10-CM

## 2012-09-30 NOTE — Progress Notes (Signed)
  Subjective:    Patient ID: Charlotte Henry, female    DOB: Dec 09, 1940, 72 y.o.   MRN: [id number]  HPI  Pt here to follow up chronic medical problems. No specialist appt since our last visits, legs doing okay. Sleeping fairly well. No specific concerns DM- watches diet, avoids sweets Medications reviewed Review of Systems  GEN- denies fatigue, fever, weight loss,weakness, recent illness HEENT- denies eye drainage, change in vision, nasal discharge, CVS- denies chest pain, palpitations RESP- denies SOB, cough, wheeze ABD- denies N/V, change in stools, abd pain GU- denies dysuria, hematuria, dribbling, incontinence MSK- denies joint pain, muscle aches, injury Neuro- denies headache, dizziness, syncope, seizure activity      Objective:   Physical Exam GEN- NAD, alert and oriented x3 HEENT- PERRL, EOMI, non injected sclera, pink conjunctiva, MMM, oropharynx clear Neck- Supple, no LAD CVS- RRR, no murmur RESP-CTAB EXT- No edema Pulses- Radial 2+, diminished pulses bilateral DP       Assessment & Plan:

## 2012-09-30 NOTE — Patient Instructions (Addendum)
Schedule an appointment with Dr. Nile Riggs Medications refilled Try the moleskin for your shoes F/U at [facility]

## 2012-09-30 NOTE — Assessment & Plan Note (Addendum)
Check sugar randomly off meds, diet controlled, due to hypoglycemia on meds Due for EYE exam- Dr. Nile Riggs

## 2012-09-30 NOTE — Assessment & Plan Note (Signed)
No chest pain, doing well, f/u cardiology this summer

## 2012-09-30 NOTE — Assessment & Plan Note (Signed)
Did not tolerate gabapentin during day, continue 600mg  QHS

## 2012-09-30 NOTE — Assessment & Plan Note (Signed)
BP well controlled with lower dose of Diovan

## 2012-10-01 MED ORDER — ALPRAZOLAM 0.25 MG PO TABS: 0.2500 mg | ORAL_TABLET | Freq: Every evening | ORAL | Status: DC | PRN

## 2012-10-01 NOTE — Addendum Note (Signed)
Addended by: Milinda Antis F on: 10/01/2012 08:11 AM   Modules accepted: Orders

## 2012-11-28 ENCOUNTER — Other Ambulatory Visit: Payer: Self-pay | Admitting: Family Medicine

## 2012-12-12 ENCOUNTER — Other Ambulatory Visit: Payer: Self-pay

## 2012-12-17 ENCOUNTER — Ambulatory Visit (INDEPENDENT_AMBULATORY_CARE_PROVIDER_SITE_OTHER): Payer: Medicare Other | Admitting: Cardiology

## 2012-12-17 ENCOUNTER — Encounter: Payer: Self-pay | Admitting: Cardiology

## 2012-12-17 VITALS — BP 104/71 | HR 58 | Ht 67.0 in | Wt 162.5 lb

## 2012-12-17 DIAGNOSIS — I739 Peripheral vascular disease, unspecified: Secondary | ICD-10-CM

## 2012-12-17 DIAGNOSIS — I10 Essential (primary) hypertension: Secondary | ICD-10-CM

## 2012-12-17 DIAGNOSIS — E119 Type 2 diabetes mellitus without complications: Secondary | ICD-10-CM

## 2012-12-17 DIAGNOSIS — I679 Cerebrovascular disease, unspecified: Secondary | ICD-10-CM

## 2012-12-17 DIAGNOSIS — I2510 Atherosclerotic heart disease of native coronary artery without angina pectoris: Secondary | ICD-10-CM

## 2012-12-17 DIAGNOSIS — G609 Hereditary and idiopathic neuropathy, unspecified: Secondary | ICD-10-CM

## 2012-12-17 DIAGNOSIS — G629 Polyneuropathy, unspecified: Secondary | ICD-10-CM

## 2012-12-17 NOTE — Progress Notes (Deleted)
Name: Charlotte Henry    DOB: [birth year]  Age: 72 y.o.  MR#: [id number]       PCP:  Milinda Antis, MD      Insurance: Payor: MEDICARE / Plan: MEDICARE PART A AND B / Product Type: *No Product type* /   CC:   No chief complaint on file.  NO LIST VS Filed Vitals:   12/17/12 1441  BP: 104/71  Pulse: 58  Height: 5\' 7"  (1.702 m)  Weight: 162 lb 8 oz (73.71 kg)    Weights Current Weight  12/17/12 162 lb 8 oz (73.71 kg)  09/30/12 160 lb 6.4 oz (72.757 kg)  07/01/12 159 lb (72.122 kg)    Blood Pressure  BP Readings from Last 3 Encounters:  12/17/12 104/71  09/30/12 118/70  07/01/12 96/62     Admit date:  (Not on file) Last encounter with RMR:  07/19/2012   Allergy Plasticized base; Adhesive; Ace inhibitors; Aspirin; Codeine; Naprosyn; Nsaids; Ultram; and Varenicline tartrate  Current Outpatient Prescriptions  Medication Sig Dispense Refill  . ALPRAZolam (XANAX) 0.25 MG tablet Take 1 tablet (0.25 mg total) by mouth at bedtime as needed.  30 tablet  1  . aspirin 81 MG tablet Take 81 mg by mouth daily.        . calcium citrate-vitamin D (CITRACAL+D) 315-200 MG-UNIT per tablet Take 1 tablet by mouth daily.        . Cranberry 405 MG CAPS Take 1 capsule by mouth daily.        . diclofenac sodium (VOLTAREN) 1 % GEL Use the gel twice a day to knee joint  1 Tube  3  . DIOVAN 160 MG tablet TAKE TWO TABLETS BY MOUTH DAILY.  90 tablet  0  . lidocaine (LIDODERM) 5 % Place 1 patch onto the skin daily. Remove & Discard patch within 12 hours or as directed by MD  30 patch  2  . NEURONTIN 300 MG capsule TAKE 2 CAPSULE BY MOUTH EVERY 8 HOURS FOR LEG PAIN.  180 each  2  . NEXIUM 40 MG capsule TAKE 1 CAPSULE BY MOUTH ONCE DAILY FOR STOMACH.  30 capsule  6  . pravastatin (PRAVACHOL) 40 MG tablet Take 1 tablet (40 mg total) by mouth daily.  90 tablet  3  . ZETIA 10 MG tablet TAKE 1 TABLET BY MOUTH ONCE A DAY.  30 tablet  6  . acetaminophen (TYLENOL EX ST ARTHRITIS PAIN) 500 MG tablet Take 500 mg by  mouth every 6 (six) hours as needed.         No current facility-administered medications for this visit.    Discontinued Meds:    Medications Discontinued During This Encounter  Medication Reason  . dicyclomine (BENTYL) 10 MG capsule Error    Patient Active Problem List   Diagnosis Date Noted  . Peripheral neuropathy 07/01/2012  . Insomnia 07/01/2012  . Constipation 03/24/2012  . Hip pain 03/24/2012  . Knee pain 03/06/2012  . Peripheral vascular disease, unspecified 02/07/2012  . Weight loss 12/04/2011  . Chronic kidney disease (CKD), stage III (moderate) 08/25/2010  . ATHEROSCLEROTIC CARDIOVASCULAR DISEASE 08/16/2009  . CEREBROVASCULAR DISEASE 08/16/2009  . Peripheral vascular disease 08/16/2009  . Diabetes mellitus 10/26/2008  . HYPERLIPIDEMIA 10/26/2008  . Gout, unspecified 10/26/2008  . HYPERTENSION 10/26/2008  . Asthmatic bronchitis 10/26/2008  . Gastroesophageal reflux disease 10/26/2008    LABS    Component Value Date/Time   NA 142 07/01/2012 0914   NA 140 03/04/2012 1536  NA 140 09/24/2010 0942   K 5.0 07/01/2012 0914   K 5.1 03/04/2012 1536   K 4.0 09/24/2010 0942   CL 112 07/01/2012 0914   CL 106 03/04/2012 1536   CL 109 09/24/2010 0942   CO2 27 07/01/2012 0914   CO2 28 03/04/2012 1536   CO2 24 09/24/2010 0942   GLUCOSE 94 07/01/2012 0914   GLUCOSE 97 03/04/2012 1536   GLUCOSE 73 09/24/2010 0942   BUN 26* 07/01/2012 0914   BUN 18 03/04/2012 1536   BUN 17 09/24/2010 0942   CREATININE 1.11* 07/01/2012 0914   CREATININE 0.92 03/04/2012 1536   CREATININE 0.96 09/24/2010 0942   CREATININE 0.91 03/02/2010 1940   CREATININE 1.28 01/05/2010   CREATININE 1.52* 12/09/2009 1850   CALCIUM 9.7 07/01/2012 0914   CALCIUM 9.8 03/04/2012 1536   CALCIUM 9.5 09/24/2010 0942   GFRNONAA 34* 12/09/2009 1850   GFRAA  Value: 41        The eGFR has been calculated using the MDRD equation. This calculation has not been validated in all clinical situations. eGFR's persistently <60 mL/min signify  possible Chronic Kidney Disease.* 12/09/2009 1850   CMP     Component Value Date/Time   NA 142 07/01/2012 0914   K 5.0 07/01/2012 0914   CL 112 07/01/2012 0914   CO2 27 07/01/2012 0914   GLUCOSE 94 07/01/2012 0914   BUN 26* 07/01/2012 0914   CREATININE 1.11* 07/01/2012 0914   CREATININE 0.91 03/02/2010 1940   CALCIUM 9.7 07/01/2012 0914   PROT 6.8 07/01/2012 0914   ALBUMIN 4.1 07/01/2012 0914   AST 16 07/01/2012 0914   ALT 12 07/01/2012 0914   ALKPHOS 50 07/01/2012 0914   BILITOT 0.5 07/01/2012 0914   GFRNONAA 34* 12/09/2009 1850   GFRAA  Value: 41        The eGFR has been calculated using the MDRD equation. This calculation has not been validated in all clinical situations. eGFR's persistently <60 mL/min signify possible Chronic Kidney Disease.* 12/09/2009 1850       Component Value Date/Time   WBC 8.1 07/01/2012 0914   WBC 8.2 03/04/2012 1536   WBC 11.7 01/05/2010   HGB 12.2 07/01/2012 0914   HGB 11.7* 03/04/2012 1536   HGB 9.7 01/05/2010   HCT 36.9 07/01/2012 0914   HCT 36.3 03/04/2012 1536   HCT 30.2 01/05/2010   MCV 90.0 07/01/2012 0914   MCV 92.4 03/04/2012 1536   MCV 90.7 01/05/2010    Lipid Panel     Component Value Date/Time   CHOL 132 07/01/2012 0914   TRIG 107 07/01/2012 0914   HDL 40 07/01/2012 0914   CHOLHDL 3.3 07/01/2012 0914   VLDL 21 07/01/2012 0914   LDLCALC 71 07/01/2012 0914    ABG No results found for this basename: phart, pco2, pco2art, po2, po2art, hco3, tco2, acidbasedef, o2sat     Lab Results  Component Value Date   TSH 1.57 08/11/2008   BNP (last 3 results) No results found for this basename: PROBNP,  in the last 8760 hours Cardiac Panel (last 3 results) No results found for this basename: CKTOTAL, CKMB, TROPONINI, RELINDX,  in the last 72 hours  Iron/TIBC/Ferritin No results found for this basename: iron, tibc, ferritin     EKG Orders placed in visit on 12/17/12  . EKG 12-LEAD     Prior Assessment and Plan Problem List as of 12/17/2012   Diabetes mellitus   Last  Assessment & Plan   09/30/2012 Office Visit  Edited 09/30/2012  9:59 PM by Salley Scarlet, MD     Check sugar randomly off meds, diet controlled, due to hypoglycemia on meds Due for EYE exam- Dr. Woody Seller   Last Assessment & Plan   07/01/2012 Office Visit Written 07/01/2012  1:01 PM by Salley Scarlet, MD     Recheck fasting lipid panel she is on Pravachol and zetia    Gout, unspecified   HYPERTENSION   Last Assessment & Plan   09/30/2012 Office Visit Written 09/30/2012  9:57 PM by Salley Scarlet, MD     BP well controlled with lower dose of Diovan    ATHEROSCLEROTIC CARDIOVASCULAR DISEASE   Last Assessment & Plan   09/30/2012 Office Visit Written 09/30/2012 10:03 PM by Salley Scarlet, MD     No chest pain, doing well, f/u cardiology this summer    CEREBROVASCULAR DISEASE   Last Assessment & Plan   12/04/2011 Office Visit Written 12/04/2011 11:00 AM by Salley Scarlet, MD     History of TIA per patient CT scan in 2001 and showed multiple lacunar infarcts. No residual deficits    Peripheral vascular disease   Last Assessment & Plan   12/04/2011 Office Visit Written 12/04/2011 11:00 AM by Salley Scarlet, MD     Reviewed vascular surgeons last note. Reiterated importance of smoking cessation    Asthmatic bronchitis   Gastroesophageal reflux disease   Chronic kidney disease (CKD), stage III (moderate)   Last Assessment & Plan   07/01/2012 Office Visit Written 07/01/2012  1:00 PM by Salley Scarlet, MD     Send for her fasting labs renal function we will recheck    Weight loss   Last Assessment & Plan   12/04/2011 Office Visit Written 12/04/2011 11:01 AM by Salley Scarlet, MD     She's had 52 pound weight loss over the past year. I will obtain her PCP records regarding this. She said has had a colonoscopy in 2009. She is a heavy smoker. She is status post hysterectomy and gets mammograms on a regular basis which have been normal per report. Will have her followup after I  have reviewed records. She will likely need CT scan of chest and abdomen for cancer workup    Peripheral vascular disease, unspecified   Last Assessment & Plan   07/01/2012 Office Visit Written 07/01/2012  1:02 PM by Salley Scarlet, MD     Her symptoms have not worsened. I will have her follow with her vascular surgeon.    Knee pain   Last Assessment & Plan   03/21/2012 Office Visit Written 03/24/2012  5:15 PM by Salley Scarlet, MD     Effusion improved, using lidoderm , obtain xray, pt still wants to wait on ortho for now    Constipation   Hip pain   Last Assessment & Plan   03/21/2012 Office Visit Written 03/24/2012  5:16 PM by Salley Scarlet, MD     Differentials due to constipation , OA of hip or from over use of right side due to left knee pain and compensation. Xray of hip/pelvis done, no falls, she still declines ortho at this time    Peripheral neuropathy   Last Assessment & Plan   09/30/2012 Office Visit Written 09/30/2012  9:59 PM by Salley Scarlet, MD     Did not tolerate gabapentin during day, continue 600mg  QHS     Insomnia   Last Assessment &  Plan   07/01/2012 Office Visit Written 07/01/2012  1:04 PM by Salley Scarlet, MD     Low-dose Xanax for that and stress. She will continue at bedtime as needed        Imaging: No results found.

## 2012-12-17 NOTE — Patient Instructions (Addendum)
Your physician recommends that you schedule a follow-up appointment in:8 MONTHS

## 2012-12-30 ENCOUNTER — Ambulatory Visit (INDEPENDENT_AMBULATORY_CARE_PROVIDER_SITE_OTHER): Payer: Medicare Other | Admitting: Family Medicine

## 2012-12-30 ENCOUNTER — Telehealth: Payer: Self-pay | Admitting: Family Medicine

## 2012-12-30 ENCOUNTER — Encounter: Payer: Self-pay | Admitting: Family Medicine

## 2012-12-30 VITALS — BP 110/70 | HR 68 | Temp 97.1°F | Resp 14 | Wt 164.0 lb

## 2012-12-30 DIAGNOSIS — K589 Irritable bowel syndrome without diarrhea: Secondary | ICD-10-CM

## 2012-12-30 DIAGNOSIS — I10 Essential (primary) hypertension: Secondary | ICD-10-CM

## 2012-12-30 DIAGNOSIS — G4700 Insomnia, unspecified: Secondary | ICD-10-CM

## 2012-12-30 DIAGNOSIS — E119 Type 2 diabetes mellitus without complications: Secondary | ICD-10-CM

## 2012-12-30 LAB — CBC WITH DIFFERENTIAL/PLATELET
Eosinophils Absolute: 0.2 10*3/uL (ref 0.0–0.7)
Hemoglobin: 11.6 g/dL — ABNORMAL LOW (ref 12.0–15.0)
Lymphs Abs: 3.2 10*3/uL (ref 0.7–4.0)
MCH: 30.1 pg (ref 26.0–34.0)
Neutro Abs: 3.9 10*3/uL (ref 1.7–7.7)
Neutrophils Relative %: 51 % (ref 43–77)
Platelets: 235 10*3/uL (ref 150–400)
RBC: 3.85 MIL/uL — ABNORMAL LOW (ref 3.87–5.11)
WBC: 7.9 10*3/uL (ref 4.0–10.5)

## 2012-12-30 LAB — COMPREHENSIVE METABOLIC PANEL
Albumin: 4.1 g/dL (ref 3.5–5.2)
Alkaline Phosphatase: 57 U/L (ref 39–117)
BUN: 26 mg/dL — ABNORMAL HIGH (ref 6–23)
CO2: 26 mEq/L (ref 19–32)
Calcium: 9.5 mg/dL (ref 8.4–10.5)
Chloride: 108 mEq/L (ref 96–112)
Glucose, Bld: 96 mg/dL (ref 70–99)
Potassium: 4.6 mEq/L (ref 3.5–5.3)
Sodium: 142 mEq/L (ref 135–145)
Total Protein: 6.8 g/dL (ref 6.0–8.3)

## 2012-12-30 MED ORDER — ALPRAZOLAM 0.25 MG PO TABS: 0.2500 mg | ORAL_TABLET | Freq: Every evening | ORAL | Status: DC | PRN

## 2012-12-30 MED ORDER — LINACLOTIDE 145 MCG PO CAPS: 145.0000 ug | ORAL_CAPSULE | Freq: Every day | ORAL | Status: DC

## 2012-12-30 NOTE — Assessment & Plan Note (Signed)
Symptoms are not compelling enough at present to warrant further diagnostic testing or intervention.

## 2012-12-30 NOTE — Assessment & Plan Note (Signed)
Control is good based upon A1c levels over the past few years.

## 2012-12-30 NOTE — Assessment & Plan Note (Signed)
Peripheral neuropathy may be more of an issue than peripheral vascular disease in terms of lower extremity discomfort. Consultation with a neurologist can be considered.

## 2012-12-30 NOTE — Assessment & Plan Note (Signed)
Patient has been remarkably stable since 2 vessel intervention in 2004. Currently has no symptoms to suggest active myocardial ischemia.

## 2012-12-30 NOTE — Progress Notes (Signed)
  Subjective:    Patient ID: Charlotte Henry, female    DOB: 07-03-1940, 72 y.o.   MRN: [id number]  HPI  Pt here with changes in bowels, will have diarrhea some days and constipation other days. Nexium sometimes gives diarrhea but she does not want to stop this. Denies abdominal pain, sometimes feels bloated. +Colonoscopy in 2007. Has had difficulty back and forth > 3 months, in past mostly constipation. Has a pill at home that she uses sometimes for her appetite and bowels. She is afraid to eat at times because she is afraid of stools. DM- diet controlled, denies polyuria, polydipsia, due for A1C Insomnia- needs xanax refilled, sleeps well with use of medication as needed  Review of Systems   GEN- denies fatigue, fever, weight loss,weakness, recent illness HEENT- denies eye drainage, change in vision, nasal discharge, CVS- denies chest pain, palpitations RESP- denies SOB, cough, wheeze ABD- denies N/V, +change in stools, abd pain GU- denies dysuria, hematuria, dribbling, incontinence MSK- denies joint pain, muscle aches, injury Neuro- denies headache, dizziness, syncope, seizure activity      Objective:   Physical Exam GEN- NAD, alert and oriented x3 HEENT- PERRL, EOMI, non injected sclera, pink conjunctiva, MMM, oropharynx clear Neck- Supple,  CVS- RRR, no murmur RESP-CTAB ABD-NABS,soft,NT,ND EXT- No edema Pulses- Radial 2+, DP not felt        Assessment & Plan:

## 2012-12-30 NOTE — Patient Instructions (Addendum)
Start the linzess for your bowels We will call with lab results Refill on xanax REferral to Dr. Nile Riggs F/U 4 months

## 2012-12-30 NOTE — Assessment & Plan Note (Signed)
Significant obstructive disease has not been identified. Disease is monitored by Dr. Edilia Bo.

## 2012-12-30 NOTE — Assessment & Plan Note (Signed)
Doing well on xanax low dose, refilled

## 2012-12-30 NOTE — Telephone Encounter (Signed)
She can take the lactaid as needed Stop the dicyclomine this is used for spasm in the colon when you have diarrhea and irritable bowels Try the new medication I sent today Linzess instead

## 2012-12-30 NOTE — Progress Notes (Signed)
Patient ID: Charlotte Henry, female   DOB: [birth year], 72 y.o.   MRN: [id number]  HPI: Scheduled return visit for widespread vascular disease and multiple cardiovascular risk factors. Cerebrovascular and peripheral vascular disease are followed by Dr. Edilia Bo. She has moderately to markedly decreased ABIs but not much in the way of claudication. Discomfort in the lower extremity sounds more neuropathic and has improved of late.  Current Outpatient Prescriptions  Medication Sig Dispense Refill  . aspirin 81 MG tablet Take 81 mg by mouth daily.        . calcium citrate-vitamin D (CITRACAL+D) 315-200 MG-UNIT per tablet Take 1 tablet by mouth daily.        . Cranberry 405 MG CAPS Take 1 capsule by mouth daily.        . diclofenac sodium (VOLTAREN) 1 % GEL Use the gel twice a day to knee joint  1 Tube  3  . DIOVAN 160 MG tablet TAKE TWO TABLETS BY MOUTH DAILY.  90 tablet  0  . lidocaine (LIDODERM) 5 % Place 1 patch onto the skin daily. Remove & Discard patch within 12 hours or as directed by MD  30 patch  2  . NEURONTIN 300 MG capsule TAKE 2 CAPSULE BY MOUTH EVERY 8 HOURS FOR LEG PAIN.  180 each  2  . NEXIUM 40 MG capsule TAKE 1 CAPSULE BY MOUTH ONCE DAILY FOR STOMACH.  30 capsule  6  . pravastatin (PRAVACHOL) 40 MG tablet Take 1 tablet (40 mg total) by mouth daily.  90 tablet  3  . ZETIA 10 MG tablet TAKE 1 TABLET BY MOUTH ONCE A DAY.  30 tablet  6  . acetaminophen (TYLENOL EX ST ARTHRITIS PAIN) 500 MG tablet Take 500 mg by mouth every 6 (six) hours as needed.        . ALPRAZolam (XANAX) 0.25 MG tablet Take 1 tablet (0.25 mg total) by mouth at bedtime as needed.  30 tablet  1  . Linaclotide (LINZESS) 145 MCG CAPS Take 1 capsule (145 mcg total) by mouth daily. For bowels  30 capsule  3   No current facility-administered medications for this visit.   Allergies  Allergen Reactions  . Plasticized Base (Plastibase) Hives and Swelling  . Adhesive (Tape) Swelling and Rash  . Ace Inhibitors   . Aspirin    . Codeine   . Naprosyn (Naproxen)   . Nsaids   . Ultram (Tramadol)   . Varenicline Tartrate      Past medical history, social history, and family history reviewed and updated.  ROS: Denies chest pain or dyspnea. No nausea or emesis. All other systems reviewed and are negative.  PHYSICAL EXAM: BP 104/71  Pulse 58  Ht 5\' 7"  (1.702 m)  Wt 73.71 kg (162 lb 8 oz)  BMI 25.45 kg/m2;  Body mass index is 25.45 kg/(m^2). General-Well developed; no acute distress Body habitus-proportionate weight and height Neck-No JVD; minimal right carotid bruits Lungs-clear lung fields; resonant to percussion Cardiovascular-normal PMI; normal S1 and S2 Abdomen-normal bowel sounds; soft and non-tender without masses or organomegaly Musculoskeletal-No deformities, no cyanosis or clubbing Neurologic-Normal cranial nerves; symmetric strength and tone Skin-Warm, no significant lesions Extremities-decreased to absent distal pulses; no edema  EKG: Normal sinus rhythm; first-degree AV block; right bundle branch block; low-voltage in the chest leads. No previous tracing for comparison to  Earlham Bing, MD 12/30/2012  7:51 PM  ASSESSMENT AND PLAN

## 2012-12-30 NOTE — Assessment & Plan Note (Signed)
Noted in history, she called back was on bentyl in the past. Will try her on linzess, she declines returning to GI at this time No blood in stools

## 2012-12-30 NOTE — Assessment & Plan Note (Signed)
Occasional mild elevation of systolic blood pressure noted in the past. All BP determinations have been normal for the past 8 months. Current therapy is effective and will be continued.

## 2012-12-30 NOTE — Assessment & Plan Note (Signed)
Well controlled 

## 2012-12-30 NOTE — Assessment & Plan Note (Addendum)
DIET CONTROLLED, A1C repeated today wnl, Referral to eye doctor

## 2012-12-30 NOTE — Telephone Encounter (Signed)
Pt aware of message

## 2013-01-01 ENCOUNTER — Other Ambulatory Visit: Payer: Self-pay | Admitting: Family Medicine

## 2013-01-01 MED ORDER — GABAPENTIN 300 MG PO CAPS: ORAL_CAPSULE | ORAL | Status: DC

## 2013-01-01 NOTE — Telephone Encounter (Signed)
Med refill

## 2013-01-02 NOTE — Progress Notes (Signed)
LMTRC

## 2013-01-15 ENCOUNTER — Telehealth: Payer: Self-pay | Admitting: Family Medicine

## 2013-01-15 DIAGNOSIS — E119 Type 2 diabetes mellitus without complications: Secondary | ICD-10-CM

## 2013-01-15 NOTE — Telephone Encounter (Signed)
New referral sent.

## 2013-01-15 NOTE — Telephone Encounter (Signed)
Pt aware.

## 2013-01-24 ENCOUNTER — Ambulatory Visit (INDEPENDENT_AMBULATORY_CARE_PROVIDER_SITE_OTHER): Payer: Medicare Other | Admitting: Family Medicine

## 2013-01-24 ENCOUNTER — Encounter: Payer: Self-pay | Admitting: Family Medicine

## 2013-01-24 VITALS — BP 110/70 | HR 54 | Temp 97.4°F | Resp 16 | Ht 64.0 in | Wt 166.0 lb

## 2013-01-24 DIAGNOSIS — K589 Irritable bowel syndrome without diarrhea: Secondary | ICD-10-CM

## 2013-01-24 NOTE — Progress Notes (Signed)
  Subjective:    Patient ID: Charlotte Henry, female    DOB: 03/23/1941, 72 y.o.   MRN: [id number]  HPI  Pt here to f/u Linzess. Last visit we discussed her Irritable bowels. She has difficulty with constipation and loose bowels. This is been going on for over a year but worsening. She often is afraid to eat out in public secondary to having used the restroom within 30 minutes and she's had accidents trying to get to the rest room. On the other and she will often end up constipated however she takes a laxative this will cause diarrhea for multiple days after worse therefore she has stopped this. She was prescribed Bentyl in the past but cannot remember this helped. I recently prescribed her Linzess ( 3 weeks ago) she's not noticed any difference in her bowels. She does have a history of diverticulitis and her colonoscopy was last done in 2007. She denies any nausea vomiting or pain associated with her change in bowels. She states that she will get a gurgling sound after eating and then she will have a bowel movement but no severe cramping. She's also denies any blood in her stool. Also concerned about her eye doctor appointment, having the run-around trying to get appointment  Review of Systems - perabove  GEN- denies fatigue, fever, weight loss,weakness, recent illness CVS- denies chest pain, palpitations RESP- denies SOB, cough, wheeze ABD- denies N/V, +change in stools, abd pain GU- denies dysuria, hematuria, dribbling, incontinence       Objective:   Physical Exam GEN- NAD, alert and oriented x3 CVS- RRR, no murmur RESP-CTAB ABD-NABS,soft,NT,ND EXT- No edema Pulses- Radial, DP- 2+        Assessment & Plan:

## 2013-01-24 NOTE — Assessment & Plan Note (Addendum)
For now I will have her continue the Linzess , I will set her back up with her previous gastroenterologist Dr. Darrick Penna for further evaluation Suffers from both diarrhea and constipation

## 2013-01-24 NOTE — Patient Instructions (Signed)
I will check with about Eye doctor We will refer your to Dr. Darrick Penna F/U  4 months

## 2013-02-10 ENCOUNTER — Encounter: Payer: Self-pay | Admitting: Physician Assistant

## 2013-02-10 ENCOUNTER — Ambulatory Visit (INDEPENDENT_AMBULATORY_CARE_PROVIDER_SITE_OTHER): Payer: Medicare Other | Admitting: Physician Assistant

## 2013-02-10 ENCOUNTER — Ambulatory Visit: Payer: Medicare Other | Admitting: Physician Assistant

## 2013-02-10 VITALS — BP 126/70 | HR 52 | Temp 98.2°F | Resp 18 | Wt 163.0 lb

## 2013-02-10 DIAGNOSIS — M109 Gout, unspecified: Secondary | ICD-10-CM

## 2013-02-10 DIAGNOSIS — E119 Type 2 diabetes mellitus without complications: Secondary | ICD-10-CM

## 2013-02-10 LAB — CBC WITH DIFFERENTIAL/PLATELET
Basophils Absolute: 0 10*3/uL (ref 0.0–0.1)
HCT: 32.2 % — ABNORMAL LOW (ref 36.0–46.0)
Lymphocytes Relative: 38 % (ref 12–46)
Lymphs Abs: 2.8 10*3/uL (ref 0.7–4.0)
Monocytes Absolute: 0.7 10*3/uL (ref 0.1–1.0)
Neutro Abs: 3.6 10*3/uL (ref 1.7–7.7)
Platelets: 240 10*3/uL (ref 150–400)
RBC: 3.63 MIL/uL — ABNORMAL LOW (ref 3.87–5.11)
RDW: 13.8 % (ref 11.5–15.5)
WBC: 7.4 10*3/uL (ref 4.0–10.5)

## 2013-02-10 MED ORDER — LIDOCAINE 5 % EX PTCH: 1.0000 | MEDICATED_PATCH | CUTANEOUS | Status: DC

## 2013-02-10 MED ORDER — PREDNISONE 20 MG PO TABS: ORAL_TABLET | ORAL | Status: DC

## 2013-02-10 NOTE — Progress Notes (Signed)
Patient ID: Charlotte Henry MRN: [id number], DOB: Jan 29, 1941, 73 y.o. Date of Encounter: @DATE @  Chief Complaint:  Chief Complaint  Patient presents with  . severe left wrist pain x 2 days    no known injury    HPI: 72 y.o. year old female  presents with complaint of pain and swelling in the left wrist. She says that on Friday she was having no problems with the wrist at all. Had no pain no stiffness no swelling. When she woke up on Saturday morning on 02/08/2013 she had pain and swelling in the left wrist. As well she could not move the wrist fully secondary to pain. There was no itching and she says was not similar to when she has had insect bites or stings in the past. As well she reports no known history of gout. However I did see an Epic a mention of gout and noted this was dated 10/26/2008 .  Reports she had no trauma or injury to the area. As well she had done and no repetitive motion with her wrist the day prior. She says that on Saturday morning he couldn't even touch the wrist at all because of significant pain. However it has gotten much better now. She points to the radial/medial side of the wrist as the area of the pain. She also reports no cut or prick of the skin in that area recently.   Past Medical History  Diagnosis Date  . Hypertension   . ASCVD (arteriosclerotic cardiovascular disease)     Neg. stress nuc. 3/99; cath in 4/04,-80%LAD; 70% RCA, -> DESx2; residual 60% distal RCA; nl EF  . Hyperkalemia     potassium of 5.1-5.3 in 2011  . Diabetes mellitus     Treated with oral agents; HgbA1c of 7in 08/2008  . Gout   . GERD (gastroesophageal reflux disease)   . Diverticulosis   . Cholecystitis     anemia  . Hyperlipidemia   . Chronic kidney disease (CKD), stage III (moderate)     Creat.= 1.78 in 11/2009  . PVD (peripheral vascular disease)      moderate to severely decreased ABI's in 8/08, sig. aortic inflow disease; repeat ABI's in 2011 improved- mild disease on the  left and moderate to severe disease on the right  . Cerebrovascular disease      mild; <50% ICA with moderate plaque bilaterally in 12/2009  . Asthmatic bronchitis   . Tobacco abuse     50 pack years continuing at 1/2 pack per day  . COPD (chronic obstructive pulmonary disease)   . CHF (congestive heart failure)   . Arthritis   . Anemia   . Bronchitis   . Leg pain     with walking  . Nervousness(799.21)   . Stroke 2012    Mini strokes- Temporary  blindness  . Myocardial infarction   . Coronary disease   . Arterial fibromuscular dysplasia      Home Meds: See attached medication section for current medication list. Any medications entered into computer today will not appear on this note's list. The medications listed below were entered prior to today. Current Outpatient Prescriptions on File Prior to Visit  Medication Sig Dispense Refill  . acetaminophen (TYLENOL EX ST ARTHRITIS PAIN) 500 MG tablet Take 500 mg by mouth every 6 (six) hours as needed.        . ALPRAZolam (XANAX) 0.25 MG tablet Take 1 tablet (0.25 mg total) by mouth at bedtime as needed.  30 tablet  1  . aspirin 81 MG tablet Take 81 mg by mouth daily.        . calcium citrate-vitamin D (CITRACAL+D) 315-200 MG-UNIT per tablet Take 1 tablet by mouth daily.        . Cranberry 405 MG CAPS Take 1 capsule by mouth daily.        . diclofenac sodium (VOLTAREN) 1 % GEL Use the gel twice a day to knee joint  1 Tube  3  . gabapentin (NEURONTIN) 300 MG capsule TAKE 2 CAPSULE BY MOUTH EVERY 8 HOURS FOR LEG PAIN.  180 capsule  1  . Linaclotide (LINZESS) 145 MCG CAPS Take 1 capsule (145 mcg total) by mouth daily. For bowels  30 capsule  3  . NEXIUM 40 MG capsule TAKE 1 CAPSULE BY MOUTH ONCE DAILY FOR STOMACH.  30 capsule  6  . pravastatin (PRAVACHOL) 40 MG tablet Take 1 tablet (40 mg total) by mouth daily.  90 tablet  3  . ZETIA 10 MG tablet TAKE 1 TABLET BY MOUTH ONCE A DAY.  30 tablet  6  . DIOVAN 160 MG tablet TAKE TWO TABLETS BY  MOUTH DAILY.  90 tablet  0   No current facility-administered medications on file prior to visit.    Allergies:  Allergies  Allergen Reactions  . Plasticized Base [Plastibase] Hives and Swelling  . Adhesive [Tape] Swelling and Rash  . Ace Inhibitors   . Aspirin   . Codeine   . Naprosyn [Naproxen]   . Nsaids   . Ultram [Tramadol]   . Varenicline Tartrate     History   Social History  . Marital Status: Widowed    Spouse Name: N/A    Number of Children: N/A  . Years of Education: N/A   Occupational History  . Not on file.   Social History Main Topics  . Smoking status: Current Every Day Smoker -- 1.00 packs/day for 50 years    Types: Cigarettes  . Smokeless tobacco: Never Used     Comment: 6 cig. a day  . Alcohol Use: 8.4 oz/week    14 Cans of beer per week  . Drug Use: No  . Sexual Activity: Not on file   Other Topics Concern  . Not on file   Social History Narrative  . No narrative on file    Family History  Problem Relation Age of Onset  . Liver disease Mother 45  . Hypertension Mother   . Cerebral aneurysm Father   . Aneurysm Father 13  . Cancer Sister   . Liver disease Brother   . Liver disease Brother   . Liver disease Brother   . Diabetes type II Brother   . Alcohol abuse Brother   . Cancer Sister      Review of Systems:  See HPI for pertinent ROS. All other ROS negative.    Physical Exam: Blood pressure 126/70, pulse 52, temperature 98.2 F (36.8 C), temperature source Oral, resp. rate 18, weight 163 lb (73.936 kg)., Body mass index is 27.97 kg/(m^2). General: Well-nourished well-developed African American female .Appears in no acute distress. Lungs: Clear bilaterally to auscultation without wheezes, rales, or rhonchi. Breathing is unlabored. Heart: RRR with S1 S2. No murmurs, rubs, or gallops. Musculoskeletal:  Strength and tone normal for age. Extremities:  Left wrist: I see no puncture or wound. No erythema. No warmth. There is minimal  swelling of the wrist. She has range of motion in all directions but  it is somewhat limited secondary to pain and swelling. 2+ radial pulse . Neuro: Alert and oriented X 3. Moves all extremities spontaneously. Gait is normal. CNII-XII grossly in tact. Psych:  Responds to questions appropriately with a normal affect.     ASSESSMENT AND PLAN:  72 y.o. year old female with  1. Gout, unspecified Her most recent creatinine on 12/30/2012 was 1.04. However her chart does mention a history of CKD in the past. Therefore all of the way to oral anti-inflammatories such as cokers. Did note that she has a history of diabetes mellitus but this is currently controlled with diet/exercise and has not requiring medication. Recent A1c was controlled. Therefore will use prednisone. As well she says that she re: had trouble clearing gel and lidocaine patches that she usually uses for other pain. She says over last couple days she has applied to both hearing gel to the wrist and also used a lidocaine patch. She is now out of the lidocaine patches and needs a refill. We'll check lab. She is to followup at the site worsens or if it is not resolved by completion of the prednisone. - CBC with Differential - Uric acid - predniSONE (DELTASONE) 20 MG tablet; Take 3 daily for 2 days, then 2 daily for 2 days, then 1 daily for 2 days.  Dispense: 12 tablet; Refill: 0 - lidocaine (LIDODERM) 5 %; Place 1 patch onto the skin daily. Remove & Discard patch within 12 hours or as directed by MD  Dispense: 30 patch; Refill: 2  2. Chronic kidney disease (CKD), stage III (moderate)  - predniSONE (DELTASONE) 20 MG tablet; Take 3 daily for 2 days, then 2 daily for 2 days, then 1 daily for 2 days.  Dispense: 12 tablet; Refill: 0  3. Diabetes mellitus  - predniSONE (DELTASONE) 20 MG tablet; Take 3 daily for 2 days, then 2 daily for 2 days, then 1 daily for 2 days.  Dispense: 12 tablet; Refill: 0   Signed, [address] [place], [place],  [facility] 02/10/2013 5:32 PM

## 2013-02-11 ENCOUNTER — Ambulatory Visit: Payer: Medicare Other | Admitting: Family Medicine

## 2013-02-12 ENCOUNTER — Encounter: Payer: Self-pay | Admitting: Family Medicine

## 2013-02-14 ENCOUNTER — Telehealth: Payer: Self-pay | Admitting: Family Medicine

## 2013-02-14 NOTE — Telephone Encounter (Signed)
Pt reports that Prednisone is helping her wrist feel better and will take last dose tomorrow.  Want [place] to call in refill Prednisone.  Patient told Prednisone not for long term use.  She needs to complete course and it will continue to help wrist past last dose.  If pain returns next week then call us back and we can see about refill or other treatment.

## 2013-02-17 NOTE — Telephone Encounter (Signed)
Agree. Definitely should not use recurrent prednisone. However she also has a history of some kidney disease. Therefore should not take anti-inflammatories on a regular basis either. If she has recurrent pain have her followup with [place] for further instructions at that time. Would be okay to use an occasional Tylenol if needed in the meantime.

## 2013-02-26 ENCOUNTER — Ambulatory Visit (INDEPENDENT_AMBULATORY_CARE_PROVIDER_SITE_OTHER): Payer: Medicare Other | Admitting: Gastroenterology

## 2013-02-26 ENCOUNTER — Encounter: Payer: Self-pay | Admitting: Gastroenterology

## 2013-02-26 ENCOUNTER — Telehealth: Payer: Self-pay | Admitting: Family Medicine

## 2013-02-26 VITALS — BP 131/74 | HR 50 | Temp 97.4°F | Ht 67.0 in | Wt 163.2 lb

## 2013-02-26 DIAGNOSIS — K589 Irritable bowel syndrome without diarrhea: Secondary | ICD-10-CM

## 2013-02-26 NOTE — Progress Notes (Signed)
Reminder in epic °

## 2013-02-26 NOTE — Progress Notes (Signed)
CC'd to PCP 

## 2013-02-26 NOTE — Telephone Encounter (Signed)
Message copied by Samuella Cota on Wed Feb 26, 2013  2:46 PM ------      Message from: [facility], Maralyn Sago J      Created: Mon Feb 24, 2013  9:30 AM      Contact: [phone]       Please call her regarding a referral ------

## 2013-02-26 NOTE — Assessment & Plan Note (Addendum)
MIXED SX. NOW WITH DIARRHEA AFTER LINZESS. DIFFERENTIAL DIAGNOSIS INCLUDES PANCREATIC INSUFFICIENCY, MICROSCOPIC COLITIS, AND LESS LIKELY C DIFF.  SUBMIT STOOL FOR C DIFF. FOLLOW A DIARY FREE DIET.   HO GIVEN. USE LACTAID & IMODIUM AS NEEDED. CALL ON OCT 8 WITH AN UPDATE REGARDING YOUR STOOLS. CONSIDER ADDING CREON AND/OR REPEAT TCS.  FOLLOW UP IN 2 MOS.

## 2013-02-26 NOTE — Progress Notes (Addendum)
Subjective:    Patient ID: Charlotte Henry, female    DOB: [birth year], 72 y.o.   MRN: [id number]  Milinda Antis, MD  HPI WAS HAVING CONSTIPATION: #2. TRIED MIRALAX: WATERY STOOLS. WAS GIVEN LINZESS FOR 1 MO AND NOW HAVING #6 OR #7 AFTER SHE EATS. LAST LINZESS LAST WEEK. [place] TO [place]. THUR HAD IT ALL DAY.  FRI: DIDN'T EAT. SAT: [employer]/FAT BACK MEAT AND HAD LOOSE BOWEL. NO BM SINCE MON NO NEW MEDS. THE WEEK THE [place] WERE TOWN. ATE CHICKEN WINGS FRI NIGHT. NEXT AM COULDN'T MOVE HER LEFT ARM. THOUGHT SHE HAD GOUT. SAT ATE AT [facility]. HAD A HOT DOG. HAD WATERY STOOL RUNNING DOWN HER LEG. CRAMPS SO BAD COULDN'T PUT FOOT ON GAS. STILL ON LINZESS.   NOW HAVING 2-3 LOOSE OR WATERY STOOLS/DAY. NO FIBER EXCEPT OATMEAL. DRINKING WATER-WITH MEDS ONLY. NOT UP IN MIDDLE OF NIGHT HAVING A BM. SITTING DOWN OR LYING DOWN SHE DOESN'T HAVE THEM BUT AS SOON AS SHE STANDS UP SHE STARTS GOING. LAST ACCIDENT SUN AFTER EATING A COOKIE. ABLE TO TOLERATE CHICKEN AND DUMPLINGS.    Past Medical History  Diagnosis Date  . Hypertension   . ASCVD (arteriosclerotic cardiovascular disease)     Neg. stress nuc. 3/99; cath in 4/04,-80%LAD; 70% RCA, -> DESx2; residual 60% distal RCA; nl EF  . Hyperkalemia     potassium of 5.1-5.3 in 2011  . Diabetes mellitus     Treated with oral agents; HgbA1c of 7in 08/2008  . Gout   . GERD (gastroesophageal reflux disease)   . Diverticulosis   . Cholecystitis     anemia  . Hyperlipidemia   . Chronic kidney disease (CKD), stage III (moderate)     Creat.= 1.78 in 11/2009  . PVD (peripheral vascular disease)      moderate to severely decreased ABI's in 8/08, sig. aortic inflow disease; repeat ABI's in 2011 improved- mild disease on the left and moderate to severe disease on the right  . Cerebrovascular disease      mild; <50% ICA with moderate plaque bilaterally in 12/2009  . Asthmatic bronchitis   . Tobacco abuse     50 pack years continuing at 1/2 pack per  day  . COPD (chronic obstructive pulmonary disease)   . CHF (congestive heart failure)   . Arthritis   . Anemia   . Bronchitis   . Leg pain     with walking  . Nervousness(799.21)   . Stroke 2012    Mini strokes- Temporary  blindness  . Myocardial infarction   . Coronary disease   . Arterial fibromuscular dysplasia    Past Surgical History  Procedure Laterality Date  . Cholecystectomy    . Carpel tunnel release      bilateral  . Total abdominal hysterectomy w/ bilateral salpingoophorectomy    . Colonoscopy  2009  . Abdominal hysterectomy     Allergies  Allergen Reactions  . Plasticized Base [Plastibase] Hives and Swelling  . Adhesive [Tape] Swelling and Rash  . Ace Inhibitors   . Aspirin   . Codeine   . Naprosyn [Naproxen]   . Nsaids   . Ultram [Tramadol]   . Varenicline Tartrate    Current Outpatient Prescriptions  Medication Sig Dispense Refill  . acetaminophen (TYLENOL EX ST ARTHRITIS PAIN) 500 MG tablet Take 500 mg by mouth every 6 (six) hours as needed.        . ALPRAZolam (XANAX) 0.25 MG tablet Take 1 tablet (  0.25 mg total) by mouth at bedtime as needed.    Marland Kitchen aspirin 81 MG tablet Take 81 mg by mouth daily.      . calcium citrate-vitamin D (CITRACAL+D) 315-200 MG-UNIT per tablet Take 1 tablet by mouth daily.      . Cranberry 405 MG CAPS Take 1 capsule by mouth daily.      . diclofenac sodium (VOLTAREN) 1 % GEL Use the gel twice a day to knee joint    . DIOVAN 160 MG tablet TAKE TWO TABLETS BY MOUTH DAILY.    Marland Kitchen gabapentin (NEURONTIN) 300 MG capsule TAKE 2 CAPSULE BY MOUTH EVERY 8 HOURS FOR LEG PAIN.    Marland Kitchen lidocaine (LIDODERM) 5 % Place 1 patch onto the skin daily. Remove & Discard patch within 12 hours or as directed by MD    . NEXIUM 40 MG capsule TAKE 1 CAPSULE BY MOUTH ONCE DAILY FOR STOMACH.    . pravastatin (PRAVACHOL) 40 MG tablet Take 1 tablet (40 mg total) by mouth daily QPM    .      . ZETIA 10 MG tablet TAKE 1 TABLET BY MOUTH ONCE A DAY.     Family  History  Problem Relation Age of Onset  . Liver disease Mother 93  . Hypertension Mother   . Cerebral aneurysm Father   . Aneurysm Father 78  . Cancer Sister   . Liver disease Brother   . Liver disease Brother   . Liver disease Brother   . Diabetes type II Brother   . Alcohol abuse Brother   . Cancer Sister    History  Substance Use Topics  . Smoking status: Current Every Day Smoker -- 1.5 packs/day for 50 years    Types: Cigarettes  . Smokeless tobacco: Never Used     Comment: 6 cig. a day ELECTRONIC  . Alcohol Use: NONE IN 2 YEARS    14 Cans of beer per week   Review of Systems PER HPI OTHERWISE ALL SYSTEMS ARE NEGATIVE.     Objective:   Physical Exam  Vitals reviewed. Constitutional: She is oriented to person, place, and time. She appears well-nourished. No distress.  HENT:  Head: Normocephalic and atraumatic.  Mouth/Throat: No oropharyngeal exudate.  Eyes: Pupils are equal, round, and reactive to light. No scleral icterus.  Neck: Normal range of motion. Neck supple.  Cardiovascular: Normal rate, regular rhythm and normal heart sounds.   Pulmonary/Chest: Effort normal and breath sounds normal. No respiratory distress.  Abdominal: Soft. Bowel sounds are normal. She exhibits no distension. There is no tenderness.  Musculoskeletal: She exhibits no edema.  Lymphadenopathy:    She has no cervical adenopathy.  Neurological: She is alert and oriented to person, place, and time.  NO FOCAL DEFICITS   Psychiatric: She has a normal mood and affect.          Assessment & Plan:

## 2013-02-26 NOTE — Patient Instructions (Addendum)
SUBMIT STOOL SAMPLE.  FOLLOW A DIARY FREE DIET. SEE INFO BELOW.  USE LACTAID & IMODIUM AS NEEDED.  CALL ME ON OCT 8 WITH AN UPDATE REGARDING YOUR STOOLS.  FOLLOW UP IN 2 MOS.    Lactose Free Diet Lactose is a carbohydrate that is found mainly in milk and milk products, as well as in foods with added milk or whey. Lactose must be digested by the enzyme in order to be used by the body. Lactose intolerance occurs when there is a shortage of lactase. When your body is not able to digest lactose, you may feel sick to your stomach (nausea), bloating, cramping, gas and diarrhea.  There are many dairy products that may be tolerated better than milk by some people:  The use of cultured dairy products such as yogurt, buttermilk, cottage cheese, and sweet acidophilus milk (Kefir) for lactase-deficient individuals is usually well tolerated. This is because the healthy bacteria help digest lactose.   Lactose-hydrolyzed milk (Lactaid) contains 40-90% less lactose than milk and may also be well tolerated.    SPECIAL NOTES  Lactose is a carbohydrates. The major food source is dairy products. Reading food labels is important. Many products contain lactose even when they are not made from milk. Look for the following words: whey, milk solids, dry milk solids, nonfat dry milk powder. Typical sources of lactose other than dairy products include breads, candies, cold cuts, prepared and processed foods, and commercial sauces and gravies.   All foods must be prepared without milk, cream, or other dairy foods.   Soy milk and lactose-free supplements (LACTASE) may be used as an alternative to milk.   FOOD GROUP ALLOWED/RECOMMENDED AVOID/USE SPARINGLY  BREADS / STARCHES 4 servings or more* Breads and rolls made without milk. [place], [place], or [place] bread. Breads and rolls that contain milk. Prepared mixes such as muffins, biscuits, waffles, pancakes. Sweet rolls, donuts, [place] toast (if made with milk or  lactose).  Crackers: Soda crackers, graham crackers. Any crackers prepared without lactose. Zwieback crackers, corn curls, or any that contain lactose.  Cereals: Cooked or dry cereals prepared without lactose (read labels). Cooked or dry cereals prepared with lactose (read labels). Total, Cocoa Krispies. Special K.  Potatoes / Pasta / Rice: Any prepared without milk or lactose. Popcorn. Instant potatoes, frozen [place] fries, scalloped or au gratin potatoes.  VEGETABLES 2 servings or more Fresh, frozen, and canned vegetables. Creamed or breaded vegetables. Vegetables in a cheese sauce or with lactose-containing margarines.  FRUIT 2 servings or more All fresh, canned, or frozen fruits that are not processed with lactose. Any canned or frozen fruits processed with lactose.  MEAT & SUBSTITUTES 2 servings or more (4 to 6 oz. total per day) Plain beef, chicken, fish, [place], lamb, veal, pork, or ham. Kosher prepared meat products. Strained or junior meats that do not contain milk. Eggs, soy meat substitutes, nuts. Scrambled eggs, omelets, and souffles that contain milk. Creamed or breaded meat, fish, or fowl. Sausage products such as wieners, liver sausage, or cold cuts that contain milk solids. Cheese, cottage cheese, or cheese spreads.  MILK None. (See "BEVERAGES" for milk substitutes. See "DESSERTS" for ice cream and frozen desserts.) Milk (whole, 2%, skim, or chocolate). Evaporated, powdered, or condensed milk; malted milk.  SOUPS & COMBINATION FOODS Bouillon, broth, vegetable soups, clear soups, consomms. Homemade soups made with allowed ingredients. Combination or prepared foods that do not contain milk or milk products (read labels). Cream soups, chowders, commercially prepared soups containing lactose. Macaroni and  cheese, pizza. Combination or prepared foods that contain milk or milk products.  DESSERTS & SWEETS In moderation Water and fruit ices; gelatin; angel food cake. Homemade cookies,  pies, or cakes made from allowed ingredients. Pudding (if made with water or a milk substitute). Lactose-free tofu desserts. Sugar, honey, corn syrup, jam, jelly; marmalade; molasses (beet sugar); Pure sugar candy; marshmallows. Ice cream, ice milk, sherbet, custard, pudding, frozen yogurt. Commercial cake and cookie mixes. Desserts that contain chocolate. Pie crust made with milk-containing margarine; reduced-calorie desserts made with a sugar substitute that contains lactose. Toffee, peppermint, butterscotch, chocolate, caramels.  FATS & OILS In moderation Butter (as tolerated; contains very small amounts of lactose). Margarines and dressings that do not contain milk, Vegetable oils, shortening, Miracle Whip, mayonnaise, nondairy cream & whipped toppings without lactose or milk solids added (examples: Coffee Rich, Carnation Coffeemate, Rich's Whipped Topping, PolyRich). Tomasa Blase. Margarines and salad dressings containing milk; cream, cream cheese; peanut butter with added milk solids, sour cream, chip dips, made with sour cream.  BEVERAGES Carbonated drinks; tea; coffee and freeze-dried coffee; some instant coffees (check labels). Fruit drinks; fruit and vegetable juice; Rice or Soy milk. Ovaltine, hot chocolate. Some cocoas; some instant coffees; instant iced teas; powdered fruit drinks (read labels).   CONDIMENTS / MISCELLANEOUS Soy sauce, carob powder, olives, gravy made with water, baker's cocoa, pickles, pure seasonings and spices, wine, pure monosodium glutamate, catsup, mustard. Some chewing gums, chocolate, some cocoas. Certain antibiotics and vitamin / mineral preparations. Spice blends if they contain milk products. MSG extender. Artificial sweeteners that contain lactose such as Equal (Nutra-Sweet) and Sweet 'n Low. Some nondairy creamers (read labels).   SAMPLE MENU*  Breakfast   Orange Juice.  Banana.   Bran flakes.   Nondairy Creamer.  Vienna Bread (toasted).   Butter or  milk-free margarine.   Coffee or tea.    Noon Meal   Chicken Breast.  Rice.   Green beans.   Butter or milk-free margarine.  Fresh melon.   Coffee or tea.    Evening Meal   Roast Beef.  Baked potato.   Butter or milk-free margarine.   Broccoli.   Lettuce salad with vinegar and oil dressing.  [employer].   Coffee or tea.

## 2013-02-26 NOTE — Telephone Encounter (Signed)
Called pt back yesterdday pt returned my call today, pt wanting to know about referral to [facility] Dr. Darrick Penna, she stated she has made appt herself and for [place] not to worry.

## 2013-03-04 ENCOUNTER — Encounter: Payer: Self-pay | Admitting: Family

## 2013-03-05 ENCOUNTER — Ambulatory Visit: Payer: Medicare Other | Admitting: Neurosurgery

## 2013-03-05 ENCOUNTER — Encounter: Payer: Self-pay | Admitting: Family

## 2013-03-05 ENCOUNTER — Ambulatory Visit (INDEPENDENT_AMBULATORY_CARE_PROVIDER_SITE_OTHER): Payer: Medicare Other | Admitting: Family

## 2013-03-05 ENCOUNTER — Ambulatory Visit (HOSPITAL_COMMUNITY)
Admission: RE | Admit: 2013-03-05 | Discharge: 2013-03-05 | Disposition: A | Discharge status: Home / Self Care | Payer: Medicare Other | Source: Ambulatory Visit | Attending: Neurosurgery | Admitting: Neurosurgery

## 2013-03-05 VITALS — BP 110/70 | HR 58 | Resp 16 | Ht 67.0 in | Wt 163.0 lb

## 2013-03-05 DIAGNOSIS — R29898 Other symptoms and signs involving the musculoskeletal system: Secondary | ICD-10-CM

## 2013-03-05 DIAGNOSIS — M6281 Muscle weakness (generalized): Secondary | ICD-10-CM

## 2013-03-05 DIAGNOSIS — I6529 Occlusion and stenosis of unspecified carotid artery: Secondary | ICD-10-CM

## 2013-03-05 DIAGNOSIS — I739 Peripheral vascular disease, unspecified: Secondary | ICD-10-CM

## 2013-03-05 DIAGNOSIS — R209 Unspecified disturbances of skin sensation: Secondary | ICD-10-CM

## 2013-03-05 DIAGNOSIS — M79609 Pain in unspecified limb: Secondary | ICD-10-CM

## 2013-03-05 DIAGNOSIS — R200 Anesthesia of skin: Secondary | ICD-10-CM

## 2013-03-05 DIAGNOSIS — A0472 Enterocolitis due to Clostridium difficile, not specified as recurrent: Secondary | ICD-10-CM

## 2013-03-05 HISTORY — DX: Enterocolitis due to Clostridium difficile, not specified as recurrent: A04.72

## 2013-03-05 NOTE — Progress Notes (Signed)
[facility] [place] HISTORY AND PHYSICAL -PAD  Previous Bypass Surgery/ Stent Placement: No  History of Present Illness Charlotte Henry is a 72 y.o. female patient of Dr. Edilia Bo seen for a history of PVD without true claudication or rest pain. Is using electronic cigarette with nicotine, stopped cigarettes a month ago. Stopped her DM oral medication as her sugar was too low. 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 Her knees and feet hurt when she walks, but not her calves or thighs. She has arthritis pain in her hips and knees exacerbated with walking. Denies rest pain other than the neuropathy on the plantar surfaces of her feet. Patient had a mini stroke 2-3 years ago and states she had [place] of neck at that time, was admitted to [facility] of of carotid US in 2011 follows. She denies post prandial gastric pain. Patient denies non-healing wounds.  Patient denies New Medical or Surgical History.   Pt Diabetic: No, is no longer diabetic since she lost 60 + pounds. Pt smoker: smoker  (1 ppd x 50 yrs, stopped this a month ago and started e-cigs)  Pt meds include: Statin :Yes Betablocker: No ASA: Yes Other anticoagulants/antiplatelets: no  Past Medical History  Diagnosis Date  . Hypertension   . ASCVD (arteriosclerotic cardiovascular disease)     Neg. stress nuc. 3/99; cath in 4/04,-80%LAD; 70% RCA, -> DESx2; residual 60% distal RCA; nl EF  . Hyperkalemia     potassium of 5.1-5.3 in 2011  . Diabetes mellitus     Treated with oral agents; HgbA1c of 7in 08/2008  . Gout   . GERD (gastroesophageal reflux disease)   . Diverticulosis   . Cholecystitis     anemia  . Hyperlipidemia   . Chronic kidney disease (CKD), stage III (moderate)     Creat.= 1.78 in 11/2009  . PVD (peripheral vascular disease)      moderate to severely decreased ABI's in 8/08, sig. aortic inflow disease; repeat ABI's in 2011 improved- mild  disease on the left and moderate to severe disease on the right  . Cerebrovascular disease      mild; <50% ICA with moderate plaque bilaterally in 12/2009  . Asthmatic bronchitis   . Tobacco abuse     50 pack years continuing at 1/2 pack per day  . COPD (chronic obstructive pulmonary disease)   . CHF (congestive heart failure)   . Arthritis   . Anemia   . Bronchitis   . Leg pain     with walking  . Nervousness(799.21)   . Stroke 2012    Mini strokes- Temporary  blindness  . Myocardial infarction   . Coronary disease   . Arterial fibromuscular dysplasia     Social History History  Substance Use Topics  . Smoking status: Current Every Day Smoker -- 1.00 packs/day for 50 years    Types: Cigarettes  . Smokeless tobacco: Never Used     Comment: 6 cig. a day  . Alcohol Use: 8.4 oz/week    14 Cans of beer per week    Family History Family History  Problem Relation Age of Onset  . Liver disease Mother 11  . Hypertension Mother   . Cerebral aneurysm Father   . Aneurysm Father 47  . Cancer Sister   . Liver disease Brother   . Liver disease Brother   . Liver disease Brother   . Diabetes type II Brother   .  Alcohol abuse Brother   . Cancer Sister     Past Surgical History  Procedure Laterality Date  . Cholecystectomy    . Carpel tunnel release      bilateral  . Total abdominal hysterectomy w/ bilateral salpingoophorectomy    . Colonoscopy  2009  . Abdominal hysterectomy      Allergies  Allergen Reactions  . Ace Inhibitors Hives, Shortness Of Breath and Swelling  . Aspirin Other (See Comments)    Blood in stool and nose bleed  . Codeine Swelling  . Plasticized Base [Plastibase] Hives and Swelling  . Ultram [Tramadol] Swelling  . Adhesive [Tape] Swelling and Rash  . Naprosyn [Naproxen] Swelling  . Linzess [Linaclotide]     DIARRHEA, FECAL INCONTINENCE  . Nsaids   . Varenicline Tartrate Swelling    Current Outpatient Prescriptions  Medication Sig Dispense  Refill  . acetaminophen (TYLENOL EX ST ARTHRITIS PAIN) 500 MG tablet Take 500 mg by mouth every 6 (six) hours as needed.        . ALPRAZolam (XANAX) 0.25 MG tablet Take 1 tablet (0.25 mg total) by mouth at bedtime as needed.  30 tablet  1  . aspirin 81 MG tablet Take 81 mg by mouth daily.        . calcium citrate-vitamin D (CITRACAL+D) 315-200 MG-UNIT per tablet Take 1 tablet by mouth daily.        . Cranberry 405 MG CAPS Take 1 capsule by mouth daily.        . diclofenac sodium (VOLTAREN) 1 % GEL Use the gel twice a day to knee joint  1 Tube  3  . DIOVAN 160 MG tablet TAKE TWO TABLETS BY MOUTH DAILY.  90 tablet  0  . gabapentin (NEURONTIN) 300 MG capsule TAKE 2 CAPSULE BY MOUTH EVERY 8 HOURS FOR LEG PAIN.  180 capsule  1  . lidocaine (LIDODERM) 5 % Place 1 patch onto the skin daily. Remove & Discard patch within 12 hours or as directed by MD  30 patch  2  . NEXIUM 40 MG capsule TAKE 1 CAPSULE BY MOUTH ONCE DAILY FOR STOMACH.  30 capsule  6  . pravastatin (PRAVACHOL) 40 MG tablet Take 1 tablet (40 mg total) by mouth daily.  90 tablet  3  . predniSONE (DELTASONE) 20 MG tablet Take 3 daily for 2 days, then 2 daily for 2 days, then 1 daily for 2 days.  12 tablet  0  . ZETIA 10 MG tablet TAKE 1 TABLET BY MOUTH ONCE A DAY.  30 tablet  6   No current facility-administered medications for this visit.    ROS: [x]  Positive   [ ]  Denies  General:[ ]  Weight loss,  [ ]  Weight gain, [ ]  Fever, [ ]  chills Neurologic: [ ]  Dizziness, [ ]  Blackouts, [ ]  Seizure [ ]  Stroke, [ ]  "Mini stroke", [ ]  Slurred speech, [ ]  Temporary blindness;  [ ] weakness, [ ]  Hoarseness Cardiac: [ ]  Chest pain/pressure, [ ]  Shortness of breath at rest [ ]  Shortness of breath with exertion,  [ ]   Atrial fibrillation or irregular heartbeat Vascular:[ ]  Pain in legs with walking, [ ]  Pain in legs at rest ,[ ]  Pain in legs at night,  [ ]   Non-healing ulcer, [ ]  Blood clot in vein/DVT,   Pulmonary: [ ]  Home oxygen, [ ]   Productive  cough, [ ]  Coughing up blood,  [ ]  Asthma,  [ ]  Wheezing Musculoskeletal:  Arly.Keller ] Arthritis, [ ]   Low back pain,  Arly.Keller ] Joint pain Hematologic:[ ]  Easy Bruising, [ ]  Anemia; [ ]  Hepatitis Gastrointestinal: [ ]  Blood in stool,  [ ]  Gastroesophageal Reflux, [ ]  Trouble swallowing Urinary: [ ]  chronic Kidney disease, [ ]  on HD, [ ]  Burning with urination, [ ]  Frequent urination, [ ]  Difficulty urinating;  Skin: [ ]  Rashes, [ ]  Wounds     Physical Examination  Filed Vitals:   03/05/13 1511  BP: 110/70  Pulse: 58  Resp: 16   Filed Weights   03/05/13 1511  Weight: 163 lb (73.936 kg)   Body mass index is 25.52 kg/(m^2).   General: A&O x 3, WDWN. Gait: slow, deliberate, mildly antalgic Eyes: PERRLA, Pulmonary: CTAB, without wheezes , rales or rhonchi. Cardiac: regular Rythm , without murmur.         Carotid Bruits Left Right   Negative Negative    Aorta is not palpable. Bilateral radial pulses are palpable: 1+ left, 2+ right.                         VASCULAR EXAM: Extremities without ischemic changes  without Gangrene; without open wounds.Trace bilateral pretibial pitting edema.                                                                                                          LE Pulses LEFT RIGHT       FEMORAL  not palpable   palpable        POPLITEAL  not palpable   not palpable       POSTERIOR TIBIAL  not palpable   not palpable        DORSALIS PEDIS      ANTERIOR TIBIAL not palpable   palpable    Abdomen: soft, NT, no masses. Skin: no rashes, no ulcers noted. Musculoskeletal: no muscle wasting or atrophy  Neurologic: A&O X 3; Appropriate Affect ; SENSATION: normal; MOTOR FUNCTION:  moving all extremities equally, M/S 5/5 throughout. Speech is fluent/normal. CN 2-12 intact.    Non-Invasive Vascular Imaging: DATE: 03/05/2013  12/16/09 Carotid Duplex ordered by Dr. Dietrich Pates: Moderate atherosclerotic plaque formation throughout the carotid  systems bilaterally  with mildly tortuous bilateral ICA and  associated areas of mildly turbulent flow.  Obtained velocity measurements and ratios correspond to less than  50% diameter stenoses.  ABI: RIGHT 0.40, Waveforms: monophasic ant. tib and peroneal, PT not detected;  LEFT 0.58, Waveforms: monophasic Previous ABI's ( 02/07/12): RIGHT: 0.48, LEFT: 0.64  Other Imaging:  CT of head (12/09/09): IMPRESSION:  1. Scattered lacunar infarcts as described. While these appear  remote, they are age indeterminate without prior comparisons.  2. Moderate atrophy and white matter disease likely reflects the  sequelae of chronic microvascular ischemia.   ASSESSMENT: Charlotte Henry is a 72 y.o. female who presents with asymptomatic lower extremity PAD with severe arterial occlusive disease of the right lower extremity and moderate arterial occlusive disease of the left lower extremity which has progressed in the last year. She does not  seem to have claudication with walking but has hip, knees, and plantar surfaces of feet pain with walking which seems related to her severe arthritis and residual neuropathy from diabetes. She does not have non-healing wounds. Her risk factor for worsening PAD remains smoking; even though she stopped smoking cigarettes a month ago, she is getting nicotine delivered via e-cigarettes. She was not aware that she could get the e-cigarette cartridges with only vapor and no nicotine and said she could graduate to vapor only. She was encouraged to continue walking as much as possible to encourage collateral circulation. She states that she had a mini stroke 2-3 years ago and carotid US from 2011 demonstrates 50% bilateral stenosis of ICA's.  CT scan of her head without contrast performed after her possible TIA in 2011 shows scattered lacunar infarcts, non-focal, age indeterminate. Since kit has been 3 years since she had carotid Duplex exam which showed less than 50% bilateral internal carotid  stenosis, and with her history of TIA's and smoking, will repeat carotid Duplex along with ABI's for her lower extremities when she returns in 6 months.  PLAN:  I discussed in depth with the patient the nature of atherosclerosis, and emphasized the importance of maximal medical management including strict control of blood pressure, blood glucose, and lipid levels, obtaining regular exercise, and cessation of smoking.  The patient is aware that without maximal medical management the underlying atherosclerotic disease process will progress, limiting the benefit of any interventions. Based on the patient's vascular studies and examination, and after discussing with Dr. Edilia Bo, pt will return to clinic in 6  months for bilateral carotid artery Duplex and ABI's. The patient was counseled re smoking cessation and printed information was given re this. The patient was given information about PAD and stroke preventon including signs, symptoms, treatment, what symptoms should prompt the patient to seek immediate medical care, and risk reduction measures to take.  Charisse March, RN, MSN, FNP-C Office Phone: [phone]  Clinic MD: Edilia Bo  03/05/2013 3:19 PM

## 2013-03-05 NOTE — Patient Instructions (Addendum)
Peripheral Vascular Disease Peripheral Vascular Disease (PVD), also called Peripheral Arterial Disease (PAD), is a circulation problem caused by cholesterol (atherosclerotic plaque) deposits in the arteries. PVD commonly occurs in the lower extremities (legs) but it can occur in other areas of the body, such as your arms. The cholesterol buildup in the arteries reduces blood flow which can cause pain and other serious problems. The presence of PVD can place a person at risk for Coronary Artery Disease (CAD).  CAUSES  Causes of PVD can be many. It is usually associated with more than one risk factor such as:   High Cholesterol.  Smoking.  Diabetes.  Lack of exercise or inactivity.  High blood pressure (hypertension).  Obesity.  Family history. SYMPTOMS   When the lower extremities are affected, patients with PVD may experience:  Leg pain with exertion or physical activity. This is called INTERMITTENT CLAUDICATION. This may present as cramping or numbness with physical activity. The location of the pain is associated with the level of blockage. For example, blockage at the abdominal level (distal abdominal aorta) may result in buttock or hip pain. Lower leg arterial blockage may result in calf pain.  As PVD becomes more severe, pain can develop with less physical activity.  In people with severe PVD, leg pain may occur at rest.  Other PVD signs and symptoms:  Leg numbness or weakness.  Coldness in the affected leg or foot, especially when compared to the other leg.  A change in leg color.  Patients with significant PVD are more prone to ulcers or sores on toes, feet or legs. These may take longer to heal or may reoccur. The ulcers or sores can become infected.  If signs and symptoms of PVD are ignored, gangrene may occur. This can result in the loss of toes or loss of an entire limb.  Not all leg pain is related to PVD. Other medical conditions can cause leg pain such  as:  Blood clots (embolism) or Deep Vein Thrombosis.  Inflammation of the blood vessels (vasculitis).  Spinal stenosis. DIAGNOSIS  Diagnosis of PVD can involve several different types of tests. These can include:  Pulse Volume Recording Method (PVR). This test is simple, painless and does not involve the use of X-rays. PVR involves measuring and comparing the blood pressure in the arms and legs. An ABI (Ankle-Brachial Index) is calculated. The normal ratio of blood pressures is 1. As this number becomes smaller, it indicates more severe disease.  < 0.95  indicates significant narrowing in one or more leg vessels.  <0.8 there will usually be pain in the foot, leg or buttock with exercise.  <0.4 will usually have pain in the legs at rest.  <0.25  usually indicates limb threatening PVD.  Doppler detection of pulses in the legs. This test is painless and checks to see if you have a pulses in your legs/feet.  A dye or contrast material (a substance that highlights the blood vessels so they show up on x-ray) may be given to help your caregiver better see the arteries for the following tests. The dye is eliminated from your body by the kidney's. Your caregiver may order blood work to check your kidney function and other laboratory values before the following tests are performed:  Magnetic Resonance Angiography (MRA). An MRA is a picture study of the blood vessels and arteries. The MRA machine uses a large magnet to produce images of the blood vessels.  Computed Tomography Angiography (CTA). A CTA is a  specialized x-ray that looks at how the blood flows in your blood vessels. An IV may be inserted into your arm so contrast dye can be injected.  Angiogram. Is a procedure that uses x-rays to look at your blood vessels. This procedure is minimally invasive, meaning a small incision (cut) is made in your groin. A small tube (catheter) is then inserted into the artery of your groin. The catheter is  guided to the blood vessel or artery your caregiver wants to examine. Contrast dye is injected into the catheter. X-rays are then taken of the blood vessel or artery. After the images are obtained, the catheter is taken out. TREATMENT  Treatment of PVD involves many interventions which may include:  Lifestyle changes:  Quitting smoking.  Exercise.  Following a low fat, low cholesterol diet.  Control of diabetes.  Foot care is very important to the PVD patient. Good foot care can help prevent infection.  Medication:  Cholesterol-lowering medicine.  Blood pressure medicine.  Anti-platelet drugs.  Certain medicines may reduce symptoms of Intermittent Claudication.  Interventional/Surgical options:  Angioplasty. An Angioplasty is a procedure that inflates a balloon in the blocked artery. This opens the blocked artery to improve blood flow.  Stent Implant. A wire mesh tube (stent) is placed in the artery. The stent expands and stays in place, allowing the artery to remain open.  Peripheral Bypass Surgery. This is a surgical procedure that reroutes the blood around a blocked artery to help improve blood flow. This type of procedure may be performed if Angioplasty or stent implants are not an option. SEEK IMMEDIATE MEDICAL CARE IF:   You develop pain or numbness in your arms or legs.  Your arm or leg turns cold, becomes blue in color.  You develop redness, warmth, swelling and pain in your arms or legs. MAKE SURE YOU:   Understand these instructions.  Will watch your condition.  Will get help right away if you are not doing well or get worse. Document Released: 06/29/2004 Document Revised: 08/14/2011 Document Reviewed: 05/26/2008 [facility] Patient Information 2014 [place], [place].  Smoking Cessation Quitting smoking is important to your health and has many advantages. However, it is not always easy to quit since nicotine is a very addictive drug. Often times, people try 3  times or more before being able to quit. This document explains the best ways for you to prepare to quit smoking. Quitting takes hard work and a lot of effort, but you can do it. ADVANTAGES OF QUITTING SMOKING  You will live longer, feel better, and live better.  Your body will feel the impact of quitting smoking almost immediately.  Within 20 minutes, blood pressure decreases. Your pulse returns to its normal level.  After 8 hours, carbon monoxide levels in the blood return to normal. Your oxygen level increases.  After 24 hours, the chance of having a heart attack starts to decrease. Your breath, hair, and body stop smelling like smoke.  After 48 hours, damaged nerve endings begin to recover. Your sense of taste and smell improve.  After 72 hours, the body is virtually free of nicotine. Your bronchial tubes relax and breathing becomes easier.  After 2 to 12 weeks, lungs can hold more air. Exercise becomes easier and circulation improves.  The risk of having a heart attack, stroke, cancer, or lung disease is greatly reduced.  After 1 year, the risk of coronary heart disease is cut in half.  After 5 years, the risk of stroke falls to the  same as a nonsmoker.  After 10 years, the risk of lung cancer is cut in half and the risk of other cancers decreases significantly.  After 15 years, the risk of coronary heart disease drops, usually to the level of a nonsmoker.  If you are pregnant, quitting smoking will improve your chances of having a healthy baby.  The people you live with, especially any children, will be healthier.  You will have extra money to spend on things other than cigarettes. QUESTIONS TO THINK ABOUT BEFORE ATTEMPTING TO QUIT You may want to talk about your answers with your caregiver.  Why do you want to quit?  If you tried to quit in the past, what helped and what did not?  What will be the most difficult situations for you after you quit? How will you plan to  handle them?  Who can help you through the tough times? Your family? Friends? A caregiver?  What pleasures do you get from smoking? What ways can you still get pleasure if you quit? Here are some questions to ask your caregiver:  How can you help me to be successful at quitting?  What medicine do you think would be best for me and how should I take it?  What should I do if I need more help?  What is smoking withdrawal like? How can I get information on withdrawal? GET READY  Set a quit date.  Change your environment by getting rid of all cigarettes, ashtrays, matches, and lighters in your home, car, or work. Do not let people smoke in your home.  Review your past attempts to quit. Think about what worked and what did not. GET SUPPORT AND ENCOURAGEMENT You have a better chance of being successful if you have help. You can get support in many ways.  Tell your family, friends, and co-workers that you are going to quit and need their support. Ask them not to smoke around you.  Get individual, group, or telephone counseling and support. Programs are available at [employer] and health centers. Call your local health department for information about programs in your area.  Spiritual beliefs and practices may help some smokers quit.  Download a "quit meter" on your computer to keep track of quit statistics, such as how long you have gone without smoking, cigarettes not smoked, and money saved.  Get a self-help book about quitting smoking and staying off of tobacco. LEARN NEW SKILLS AND BEHAVIORS  Distract yourself from urges to smoke. Talk to someone, go for a walk, or occupy your time with a task.  Change your normal routine. Take a different route to work. Drink tea instead of coffee. Eat breakfast in a different place.  Reduce your stress. Take a hot bath, exercise, or read a book.  Plan something enjoyable to do every day. Reward yourself for not smoking.  Explore  interactive web-based programs that specialize in helping you quit. GET MEDICINE AND USE IT CORRECTLY Medicines can help you stop smoking and decrease the urge to smoke. Combining medicine with the above behavioral methods and support can greatly increase your chances of successfully quitting smoking.  Nicotine replacement therapy helps deliver nicotine to your body without the negative effects and risks of smoking. Nicotine replacement therapy includes nicotine gum, lozenges, inhalers, nasal sprays, and skin patches. Some may be available over-the-counter and others require a prescription.  Antidepressant medicine helps people abstain from smoking, but how this works is unknown. This medicine is available by prescription.  Nicotinic receptor partial agonist medicine simulates the effect of nicotine in your brain. This medicine is available by prescription. Ask your caregiver for advice about which medicines to use and how to use them based on your health history. Your caregiver will tell you what side effects to look out for if you choose to be on a medicine or therapy. Carefully read the information on the package. Do not use any other product containing nicotine while using a nicotine replacement product.  RELAPSE OR DIFFICULT SITUATIONS Most relapses occur within the first 3 months after quitting. Do not be discouraged if you start smoking again. Remember, most people try several times before finally quitting. You may have symptoms of withdrawal because your body is used to nicotine. You may crave cigarettes, be irritable, feel very hungry, cough often, get headaches, or have difficulty concentrating. The withdrawal symptoms are only temporary. They are strongest when you first quit, but they will go away within 10 14 days. To reduce the chances of relapse, try to:  Avoid drinking alcohol. Drinking lowers your chances of successfully quitting.  Reduce the amount of caffeine you consume. Once you  quit smoking, the amount of caffeine in your body increases and can give you symptoms, such as a rapid heartbeat, sweating, and anxiety.  Avoid smokers because they can make you want to smoke.  Do not let weight gain distract you. Many smokers will gain weight when they quit, usually less than 10 pounds. Eat a healthy diet and stay active. You can always lose the weight gained after you quit.  Find ways to improve your mood other than smoking. FOR MORE INFORMATION  www.smokefree.gov  Document Released: 05/16/2001 Document Revised: 11/21/2011 Document Reviewed: 08/31/2011 [facility] Patient Information 2014 [place], [place].   Stroke Prevention Some medical conditions and behaviors are associated with an increased chance of having a stroke. You may prevent a stroke by making healthy choices and managing medical conditions. Reduce your risk of having a stroke by:  Staying physically active. Get at least 30 minutes of activity on most or all days.  Not smoking. It may also be helpful to avoid exposure to secondhand smoke.  Limiting alcohol use. Moderate alcohol use is considered to be:  No more than 2 drinks per day for men.  No more than 1 drink per day for nonpregnant women.  Eating healthy foods.  Include 5 or more servings of fruits and vegetables a day.  Certain diets may be prescribed to address high blood pressure, high cholesterol, diabetes, or obesity.  Managing your cholesterol levels.  A low-saturated fat, low-trans fat, low-cholesterol, and high-fiber diet may control cholesterol levels.  Take any prescribed medicines to control cholesterol as directed by your caregiver.  Managing your diabetes.  A controlled-carbohydrate, controlled-sugar diet is recommended to manage diabetes.  Take any prescribed medicines to control diabetes as directed by your caregiver.  Controlling your high blood pressure (hypertension).  A low-salt (sodium), low-saturated fat, low-trans  fat, and low-cholesterol diet is recommended to manage high blood pressure.  Take any prescribed medicines to control hypertension as directed by your caregiver.  Maintaining a healthy weight.  A reduced-calorie, low-sodium, low-saturated fat, low-trans fat, low-cholesterol diet is recommended to manage weight.  Stopping drug abuse.  Avoiding birth control pills.  Talk to your caregiver about the risks of taking birth control pills if you are over 36 years old, smoke, get migraines, or have ever had a blood clot.  Getting evaluated for sleep disorders (sleep apnea).  Talk to your caregiver about getting a sleep evaluation if you snore a lot or have excessive sleepiness.  Taking medicines as directed by your caregiver.  For some people, aspirin or blood thinners (anticoagulants) are helpful in reducing the risk of forming abnormal blood clots that can lead to stroke. If you have the irregular heart rhythm of atrial fibrillation, you should be on a blood thinner unless there is a good reason you cannot take them.  Understand all your medicine instructions. SEEK IMMEDIATE MEDICAL CARE IF:   You have sudden weakness or numbness of the face, arm, or leg, especially on one side of the body.  You have sudden confusion.  You have trouble speaking (aphasia) or understanding.  You have sudden trouble seeing in one or both eyes.  You have sudden trouble walking.  You have dizziness.  You have a loss of balance or coordination.  You have a sudden, severe headache with no known cause.  You have new chest pain or an irregular heartbeat. Any of these symptoms may represent a serious problem that is an emergency. Do not wait to see if the symptoms will go away. Get medical help right away. Call your local emergency services (911 in [place] Do not drive yourself to the hospital. Document Released: 06/29/2004 Document Revised: 08/14/2011 Document Reviewed: 01/09/2011 [facility] Patient  Information 2014 [place], [place].

## 2013-03-06 NOTE — Addendum Note (Signed)
Addended by: Sharee Pimple on: 03/06/2013 09:53 AM   Modules accepted: Orders

## 2013-03-07 ENCOUNTER — Telehealth: Payer: Self-pay | Admitting: Gastroenterology

## 2013-03-07 LAB — CLOSTRIDIUM DIFFICILE BY PCR: Toxigenic C. Difficile by PCR: DETECTED — CR

## 2013-03-07 MED ORDER — METRONIDAZOLE 500 MG PO TABS: 500.0000 mg | ORAL_TABLET | Freq: Three times a day (TID) | ORAL | Status: DC

## 2013-03-07 NOTE — Telephone Encounter (Signed)
Received phone call from lab. Patient has a positive C. difficile PCR. Discussed with patient.  RX sent to [facility] for Flagyl.

## 2013-03-12 NOTE — Telephone Encounter (Signed)
PT CALLED BACK. DISCUSSED PRECAUTIONS. PT LIVES WITH GRANDDAUGHTER WHO IS 27 BUT DOESN'T SLEEP IN THE SAME BED. DID SLEEP IN SAME ROOM BUT NOT IN SAME BED WHILE ON VACATION. USING LYSOL AND BLEACHING HER  COMMON LIVING QUARTERS.  GRAND-DAUGHTER HAVING HEAVY MENSES. DOES USE SAME BR AS GRAND-DAUGHTER.  DIARRHEA GONE SINCE LAST WEEK. FLAGYL GIVES HER HAS IN THE AM. A LITTLE HA AT NIGHT WHEN SHE TAKES. TAKE TYLENOL PRIOR TO FLAGYL AND IF STILL HAVING DEBILITATING HAs, WILL SWITCH TO VANC PO. DISCUSSED REASONS FOR NOT STARTING FLAGYL IF THE HAs CAN BE CONTROLLED WITH TYLENOL. PT VOICED UNDERSTANDING.  Hand-Washing Hand-washing is the No. 1 preventative measure in stopping the spread and infection of C. Diff. It is important to wash hands with warm water and anti-bacterial soap for at least 30 seconds. Alcohol-based hand sanitizers are not effective against C. Diff and will not kill spores that may be on the hands.  Isolation Any person who is infected with C. Diff should remain in a separate bedroom and ideally have a separate bathroom for at least 48 hours after they have stopped having diarrhea during treatment. When you are around a person with this infection, it is important to wear gloves and, if possible, a yellow isolation gown to prevent picking up the spores from their environment. Wash your hands immediately upon leaving their room to kill any spores that you may have accidentally picked up. Separate Hygiene Items The person infected with C. Diff should have their own hygiene items. Do not share washcloths, hand towels, toothbrushes, or linens with an infected person. Linens and clothes should be washed separately from anyone else's clothes with detergent and on the hot setting of the washer and dryer. Daily Cleaning of Affected Areas It is important to clean any areas that the infected person comes into contact with daily. Bathrooms should be cleaned after every visit. Mix 1 part of bleach with 10  parts of water as a cleaner and use on toilet bowls, sinks, sink and toilet handles and doorknobs. Vacuum carpets daily and mop any hard floors with the bleach solution used for bathroom cleansing to kill spores that are in the environment

## 2013-03-12 NOTE — Telephone Encounter (Signed)
Called patient TO DISCUSS RESULTS. SHE NEEDS CONTACT PRECAUTIONS. PT SHOULD CALL [facility] AT [phone] TO DISCUSS.

## 2013-03-15 ENCOUNTER — Other Ambulatory Visit: Payer: Self-pay | Admitting: Cardiology

## 2013-04-01 ENCOUNTER — Encounter: Payer: Self-pay | Admitting: General Practice

## 2013-04-07 ENCOUNTER — Other Ambulatory Visit: Payer: Self-pay | Admitting: Cardiology

## 2013-04-11 ENCOUNTER — Ambulatory Visit (INDEPENDENT_AMBULATORY_CARE_PROVIDER_SITE_OTHER): Payer: Medicare Other | Admitting: Gastroenterology

## 2013-04-11 ENCOUNTER — Encounter: Payer: Self-pay | Admitting: Gastroenterology

## 2013-04-11 VITALS — BP 141/71 | HR 52 | Temp 98.1°F | Wt 166.0 lb

## 2013-04-11 DIAGNOSIS — K589 Irritable bowel syndrome without diarrhea: Secondary | ICD-10-CM

## 2013-04-11 NOTE — Progress Notes (Signed)
Subjective:    Patient ID: Charlotte Henry, female    DOB: [birth year], 72 y.o.   MRN: [id number]  Milinda Antis, MD  HPI Stools are like mud. Feels urge and needs to go right away. HAD C DIFF. HAD DIARRHEA THEN MUD LIKE STOOLS. LACTAID HELPS EVEN IF SHE DOESN'T EAT DAIRY. REGULAR PEPSI MAKES HER BURP AND CALMS DOWN STOOLS. BMs: NL STOOL THIS AM AFTER SHE ATE LETTUCE THIS AM.   TAKING CRANBERRY /CaVIT D EVERY MORNING. HASN'T BEEN CONSTIPATED IN A WHILE. NOT ON STEROIDS ANYMORE. LAST DOSE: OCT 2014. STEROIDS MAKE HER KNEES FEEL BETTER. CONCERNED ABOUT SWELLING IN ANKLE AND FIT. SOCK CAN LEAVE INDENT ON HER LEGS.  Past Medical History  Diagnosis Date  . Hypertension   . ASCVD (arteriosclerotic cardiovascular disease)     Neg. stress nuc. 3/99; cath in 4/04,-80%LAD; 70% RCA, -> DESx2; residual 60% distal RCA; nl EF  . Hyperkalemia     potassium of 5.1-5.3 in 2011  . Diabetes mellitus     Treated with oral agents; HgbA1c of 7in 08/2008  . Gout   . GERD (gastroesophageal reflux disease)   . Diverticulosis   . Cholecystitis     anemia  . Hyperlipidemia   . Chronic kidney disease (CKD), stage III (moderate)     Creat.= 1.78 in 11/2009  . PVD (peripheral vascular disease)      moderate to severely decreased ABI's in 8/08, sig. aortic inflow disease; repeat ABI's in 2011 improved- mild disease on the left and moderate to severe disease on the right  . Cerebrovascular disease      mild; <50% ICA with moderate plaque bilaterally in 12/2009  . Asthmatic bronchitis   . Tobacco abuse     50 pack years continuing at 1/2 pack per day  . COPD (chronic obstructive pulmonary disease)   . CHF (congestive heart failure)   . Arthritis   . Anemia   . Bronchitis   . Leg pain     with walking  . Nervousness(799.21)   . Stroke 2012    Mini strokes- Temporary  blindness  . Myocardial infarction   . Coronary disease   . Arterial fibromuscular dysplasia    Past Surgical History  Procedure Laterality  Date  . Cholecystectomy    . Carpel tunnel release      bilateral  . Total abdominal hysterectomy w/ bilateral salpingoophorectomy    . Colonoscopy  2009  . Abdominal hysterectomy     Allergies  Allergen Reactions  . Ace Inhibitors Hives, Shortness Of Breath and Swelling  . Aspirin Other (See Comments)    Blood in stool and nose bleed  . Codeine Swelling  . Plasticized Base [Plastibase] Hives and Swelling  . Ultram [Tramadol] Swelling  . Adhesive [Tape] Swelling and Rash  . Naprosyn [Naproxen] Swelling  . Linzess [Linaclotide]     DIARRHEA, FECAL INCONTINENCE  . Nsaids   . Varenicline Tartrate Swelling   Current Outpatient Prescriptions  Medication Sig Dispense Refill  . acetaminophen (TYLENOL EX ST ARTHRITIS PAIN) 500 MG tablet Take 500 mg by mouth every 6 (six) hours as needed.        . ALPRAZolam (XANAX) 0.25 MG tablet Take 1 tablet (0.25 mg total) by mouth at bedtime as needed.    Marland Kitchen aspirin 81 MG tablet Take 81 mg by mouth daily.      . calcium citrate-vitamin D (CITRACAL+D) 315-200 MG-UNIT per tablet Take 1 tablet by mouth daily.      Marland Kitchen  Cranberry 405 MG CAPS Take 1 capsule by mouth daily.      . diclofenac sodium (VOLTAREN) 1 % GEL Use the gel twice a day to knee joint    . DIOVAN 160 MG tablet TAKE TWO TABLETS BY MOUTH DAILY.    Marland Kitchen gabapentin (NEURONTIN) 300 MG capsule TAKE 2 CAPSULE BY MOUTH EVERY 8 HOURS FOR LEG PAIN.    Marland Kitchen lidocaine (LIDODERM) 5 % Place 1 patch onto the skin daily. Remove & Discard patch within 12 hours or as directed by MD    . NEXIUM 40 MG capsule TAKE 1 CAPSULE BY MOUTH ONCE DAILY FOR STOMACH.    . pravastatin (PRAVACHOL) 40 MG tablet TAKE 1 TABLET BY MOUTH ONCE DAILY.    Marland Kitchen ZETIA 10 MG tablet TAKE 1 TABLET BY MOUTH ONCE A DAY.    .      .           Review of Systems     Objective:   Physical Exam  Vitals reviewed. Constitutional: She is oriented to person, place, and time. She appears well-nourished. No distress.  HENT:  Head:  Normocephalic and atraumatic.  Mouth/Throat: Oropharynx is clear and moist. No oropharyngeal exudate.  Eyes: Pupils are equal, round, and reactive to light. No scleral icterus.  Neck: Normal range of motion. Neck supple.  Cardiovascular: Normal rate, regular rhythm and normal heart sounds.   Pulmonary/Chest: Effort normal and breath sounds normal. No respiratory distress.  Abdominal: Soft. Bowel sounds are normal. She exhibits no distension. There is no tenderness.  Musculoskeletal: Normal range of motion. She exhibits no edema.  Lymphadenopathy:    She has no cervical adenopathy.  Neurological: She is alert and oriented to person, place, and time.  NO FOCAL DEFICITS   Psychiatric: She has a normal mood and affect.          Assessment & Plan:

## 2013-04-11 NOTE — Assessment & Plan Note (Addendum)
MIXED SX. NOW WITH LOOSE/NL STOOLS. SX IMPROVED BUT INTERMITTENT LOOSE STOOLS LIKLEY DUE TO POST-INFECTIOUS STATE AND/OR LACTOSE INTOLERANCE.  TAKE A PROBIOTIC DAILY FOR ONE MONTH ([facility] BRAND, [facility], ALIGN). STOP IT IF YOU FEEL IT'S NOT MAKING A DIFFERENCE. CONTINUE LACTAID. EAT FIBER  DRINK WATER OPV IN 4 MOS

## 2013-04-11 NOTE — Patient Instructions (Signed)
TAKE A PROBIOTIC DAILY. ([employer], [employer], ALIGN OR RESTRORA). TAKE FOR ONE MONTH. STOP IT IF YOU FEEL IT'S NOT MAKING A DIFFERENCE.  CONTINUE LACTAID  FOLLOW A HIGH FIBER DIET. SEE INFO BELOW.  DRINK WATER TO KEEP YOUR URINE LIGHT YELLOW.  FOLLOW UP IN 4 MOS.    High-Fiber Diet A high-fiber diet changes your normal diet to include more whole grains, legumes, fruits, and vegetables. Changes in the diet involve replacing refined carbohydrates with unrefined foods. The calorie level of the diet is essentially unchanged. The Dietary Reference Intake (recommended amount) for adult males is 38 grams per day. For adult females, it is 25 grams per day. Pregnant and lactating women should consume 28 grams of fiber per day. Fiber is the intact part of a plant that is not broken down during digestion. Functional fiber is fiber that has been isolated from the plant to provide a beneficial effect in the body. PURPOSE  Increase stool bulk.   Ease and regulate bowel movements.   Lower cholesterol.  INDICATIONS THAT YOU NEED MORE FIBER  Constipation and hemorrhoids.   Uncomplicated diverticulosis (intestine condition) and irritable bowel syndrome.   Weight management.   As a protective measure against hardening of the arteries (atherosclerosis), diabetes, and cancer.   GUIDELINES FOR INCREASING FIBER IN THE DIET  Start adding fiber to the diet slowly. A gradual increase of about 5 more grams (2 slices of whole-wheat bread, 2 servings of most fruits or vegetables, or 1 bowl of high-fiber cereal) per day is best. Too rapid an increase in fiber may result in constipation, flatulence, and bloating.   Drink enough water and fluids to keep your urine clear or pale yellow. Water, juice, or caffeine-free drinks are recommended. Not drinking enough fluid may cause constipation.   Eat a variety of high-fiber foods rather than one type of fiber.   Try to increase your intake of  fiber through using high-fiber foods rather than fiber pills or supplements that contain small amounts of fiber.   The goal is to change the types of food eaten. Do not supplement your present diet with high-fiber foods, but replace foods in your present diet.  INCLUDE A VARIETY OF FIBER SOURCES  Replace refined and processed grains with whole grains, canned fruits with fresh fruits, and incorporate other fiber sources. White rice, white breads, and most bakery goods contain little or no fiber.   Brown whole-grain rice, buckwheat oats, and many fruits and vegetables are all good sources of fiber. These include: broccoli, Brussels sprouts, cabbage, cauliflower, beets, sweet potatoes, white potatoes (skin on), carrots, tomatoes, eggplant, squash, berries, fresh fruits, and dried fruits.   Cereals appear to be the richest source of fiber. Cereal fiber is found in whole grains and bran. Bran is the fiber-rich outer coat of cereal grain, which is largely removed in refining. In whole-grain cereals, the bran remains. In breakfast cereals, the largest amount of fiber is found in those with "bran" in their names. The fiber content is sometimes indicated on the label.   You may need to include additional fruits and vegetables each day.   In baking, for 1 cup white flour, you may use the following substitutions:   1 cup whole-wheat flour minus 2 tablespoons.   1/2 cup white flour plus 1/2 cup whole-wheat flour.

## 2013-04-14 NOTE — Progress Notes (Signed)
cc'd to pcp 

## 2013-04-17 ENCOUNTER — Other Ambulatory Visit: Payer: Self-pay | Admitting: Family Medicine

## 2013-04-17 DIAGNOSIS — Z139 Encounter for screening, unspecified: Secondary | ICD-10-CM

## 2013-04-28 ENCOUNTER — Ambulatory Visit (HOSPITAL_COMMUNITY)
Admission: RE | Admit: 2013-04-28 | Discharge: 2013-04-28 | Disposition: A | Discharge status: Home / Self Care | Payer: Medicare Other | Source: Ambulatory Visit | Attending: Family Medicine | Admitting: Family Medicine

## 2013-04-28 DIAGNOSIS — Z1231 Encounter for screening mammogram for malignant neoplasm of breast: Secondary | ICD-10-CM | POA: Insufficient documentation

## 2013-04-28 DIAGNOSIS — Z139 Encounter for screening, unspecified: Secondary | ICD-10-CM

## 2013-05-07 ENCOUNTER — Ambulatory Visit: Payer: Medicare Other | Admitting: Gastroenterology

## 2013-05-08 ENCOUNTER — Ambulatory Visit: Payer: Medicare Other | Admitting: Gastroenterology

## 2013-05-12 ENCOUNTER — Ambulatory Visit (INDEPENDENT_AMBULATORY_CARE_PROVIDER_SITE_OTHER): Payer: Medicare Other | Admitting: Family Medicine

## 2013-05-12 ENCOUNTER — Encounter: Payer: Self-pay | Admitting: Family Medicine

## 2013-05-12 VITALS — BP 110/70 | HR 68 | Temp 98.1°F | Resp 18 | Ht 67.0 in | Wt 169.5 lb

## 2013-05-12 DIAGNOSIS — E785 Hyperlipidemia, unspecified: Secondary | ICD-10-CM

## 2013-05-12 DIAGNOSIS — E119 Type 2 diabetes mellitus without complications: Secondary | ICD-10-CM

## 2013-05-12 DIAGNOSIS — I739 Peripheral vascular disease, unspecified: Secondary | ICD-10-CM

## 2013-05-12 DIAGNOSIS — I10 Essential (primary) hypertension: Secondary | ICD-10-CM

## 2013-05-12 DIAGNOSIS — N183 Chronic kidney disease, stage 3 unspecified: Secondary | ICD-10-CM

## 2013-05-12 MED ORDER — ALPRAZOLAM 0.25 MG PO TABS: 0.2500 mg | ORAL_TABLET | Freq: Every evening | ORAL | Status: DC | PRN

## 2013-05-12 NOTE — Assessment & Plan Note (Signed)
Diet control she will have her A1c as well as her fasting labs done before her next visit He was given numbers for eye doctor Prevnar 13 given

## 2013-05-12 NOTE — Assessment & Plan Note (Signed)
Plan to check cholesterol panel prior to next visit . Her now continue the statin drug and zetia

## 2013-05-12 NOTE — Patient Instructions (Signed)
Continue current medications Prevnar 13 pneumonia shot given [facility]- [phone] [facility] [phone] Get the labs done 1 week before  F/U 4 months

## 2013-05-12 NOTE — Progress Notes (Signed)
   Subjective:    Patient ID: MAKINZEE DURLEY, female    DOB: [birth year], 72 y.o.   MRN: [id number]  HPI  Patient here to follow chronic medical problems. She has no specific concerns today. She overall feels well. She still has not seen Eye doctor yet.Marland Kitchen She was evaluated by vascular vein her peripheral arterial disease is currently stable. She is due to have carotid ultrasound and repeat ABIs at her followup visit in 6 months. Diabetes mellitus diet controlled. She is due for Prevnar 13   Review of Systems  GEN- denies fatigue, fever, weight loss,weakness, recent illness HEENT- denies eye drainage, change in vision, nasal discharge, CVS- denies chest pain, palpitations RESP- denies SOB, cough, wheeze ABD- denies N/V, change in stools, abd pain GU- denies dysuria, hematuria, dribbling, incontinence MSK- denies joint pain, muscle aches, injury Neuro- denies headache, dizziness, syncope, seizure activity      Objective:   Physical Exam GEN- NAD, alert and oriented x3 HEENT- PERRL, EOMI, non injected sclera, pink conjunctiva, MMM, oropharynx clear Neck- Supple, no thryomegaly CVS- RRR, no murmur RESP-CTAB EXT- No edema Pulses- Radial 2+, DP decreased bilat        Assessment & Plan:

## 2013-05-12 NOTE — Assessment & Plan Note (Signed)
Blood pressure well controlled

## 2013-05-12 NOTE — Assessment & Plan Note (Signed)
I reviewed her labs office visit from vascular surgery. We will check her cholesterol panel per above

## 2013-05-14 ENCOUNTER — Other Ambulatory Visit: Payer: Self-pay | Admitting: Cardiovascular Disease

## 2013-05-15 ENCOUNTER — Other Ambulatory Visit: Payer: Self-pay | Admitting: Cardiovascular Disease

## 2013-05-19 ENCOUNTER — Encounter: Payer: Self-pay | Admitting: Family Medicine

## 2013-05-19 ENCOUNTER — Ambulatory Visit (INDEPENDENT_AMBULATORY_CARE_PROVIDER_SITE_OTHER): Payer: Medicare Other | Admitting: Family Medicine

## 2013-05-19 VITALS — BP 118/70 | HR 63 | Resp 16 | Ht 67.0 in | Wt 169.0 lb

## 2013-05-19 DIAGNOSIS — N183 Chronic kidney disease, stage 3 unspecified: Secondary | ICD-10-CM

## 2013-05-19 DIAGNOSIS — Z23 Encounter for immunization: Secondary | ICD-10-CM

## 2013-05-19 DIAGNOSIS — M179 Osteoarthritis of knee, unspecified: Secondary | ICD-10-CM

## 2013-05-19 DIAGNOSIS — IMO0002 Reserved for concepts with insufficient information to code with codable children: Secondary | ICD-10-CM

## 2013-05-19 DIAGNOSIS — R21 Rash and other nonspecific skin eruption: Secondary | ICD-10-CM

## 2013-05-19 DIAGNOSIS — M1710 Unilateral primary osteoarthritis, unspecified knee: Secondary | ICD-10-CM

## 2013-05-19 NOTE — Patient Instructions (Signed)
Use topical- Hydrocortisone 1% with vaseline on your leg for the itching Take the tylenol for the knee pain Prevnar 13 given F/U End of April

## 2013-05-19 NOTE — Assessment & Plan Note (Signed)
Do not see any particular rash on her shin. Advised she can use topical hydrocortisone along with a moisturizer to see if this helps. Regarding a lesion on her face this is been going on for many years and resolves on its own without any intervention.

## 2013-05-19 NOTE — Addendum Note (Signed)
Addended by: Elvina Mattes T on: 05/19/2013 12:50 PM   Modules accepted: Orders

## 2013-05-19 NOTE — Assessment & Plan Note (Signed)
Discussed stability of her renal disease. She had worsening renal impairment back in 2011 things have been stable since then. Reviewed her labs with her

## 2013-05-19 NOTE — Assessment & Plan Note (Signed)
She can use Tylenol extra strength twice a day as needed. She declines return to orthopedics. She does not want any injections in the knees

## 2013-05-19 NOTE — Progress Notes (Signed)
   Subjective:    Patient ID: Charlotte Henry, female    DOB: 10-16-1940, 72 y.o.   MRN: [id number]  HPI Patient actually presented to to me for her pneumonia shot but had other concerns therefore turned into OV . On her discharge summary from the last visit she noted that chronic kidney disease was part of her diagnosis she was not aware of this. She wanted me to explain the diagnosis. She also complained of acute on chronic knee pain which she is seeing orthopedics for the past. She also use full tear in gel this morning which helps. She's not had any fall or swelling. She asked about the Tylenol arthritis.  Is also concerned about some itching on her left shin she's not had any rash but was concerned about shingles. She also gets a dark mark looks like a scab that comes up on her face every now and then she usually puts Vaseline and it resolves   Review of Systems - [per above   GEN- denies fatigue, fever, weight loss,weakness, recent illness HEENT- denies eye drainage, change in vision, nasal discharge, CVS- denies chest pain, palpitations RESP- denies SOB, cough, wheezee MSK- + joint pain, muscle aches, injury Neuro- denies headache, dizziness, syncope, seizure activity      Objective:   Physical Exam GEN-NAD,alert and oriented x 3 MSK- Knee normal appearance bilat, no effusion, no warmth, fair ROM bilat, no pain with manipulation Skin- in tact, hyperpigemented macule on left shin, dry skin , no other lesions, right side of face hyperpigemented liner scab appearing lesion, no erythema no flaking, NT       Assessment & Plan:

## 2013-06-04 ENCOUNTER — Encounter: Payer: Self-pay | Admitting: *Deleted

## 2013-06-04 NOTE — Telephone Encounter (Signed)
This encounter was created in error - please disregard.

## 2013-07-11 ENCOUNTER — Telehealth: Payer: Self-pay | Admitting: Adult Health

## 2013-07-11 MED ORDER — EZETIMIBE 10 MG PO TABS: 10.0000 mg | ORAL_TABLET | Freq: Every day | ORAL | Status: DC

## 2013-07-11 NOTE — Telephone Encounter (Signed)
rx refilled.

## 2013-07-11 NOTE — Telephone Encounter (Signed)
Received fax refill request  Rx # [id number] Medication:  Zetia 10 mg tablets Qty 30 Sig:  Take one tablet by mouth once a day Physician:  Purcell Nails

## 2013-08-14 ENCOUNTER — Encounter: Payer: Self-pay | Admitting: Gastroenterology

## 2013-08-14 ENCOUNTER — Encounter (INDEPENDENT_AMBULATORY_CARE_PROVIDER_SITE_OTHER): Payer: Self-pay

## 2013-08-14 ENCOUNTER — Ambulatory Visit (INDEPENDENT_AMBULATORY_CARE_PROVIDER_SITE_OTHER): Payer: Commercial Managed Care - HMO | Admitting: Gastroenterology

## 2013-08-14 VITALS — BP 119/71 | HR 50 | Temp 97.6°F | Ht 67.0 in | Wt 169.8 lb

## 2013-08-14 DIAGNOSIS — K589 Irritable bowel syndrome without diarrhea: Secondary | ICD-10-CM

## 2013-08-14 MED ORDER — PANCRELIPASE (LIP-PROT-AMYL) 36000-114000 UNITS PO CPEP: 1.0000 | ORAL_CAPSULE | ORAL | Status: DC

## 2013-08-14 NOTE — Progress Notes (Signed)
cc'd to pcp 

## 2013-08-14 NOTE — Progress Notes (Signed)
Subjective:    Patient ID: Charlotte Henry, female    DOB: 11/28/1940, 73 y.o.   MRN: [id number] Charlotte Blackbird, MD  HPI Quit SMOKING SEP 2014 AND WENT TO [employer]LECTRONIC CIGS. [place] 2013-07-05. WENT TO FOUR FUNERALS IN ONE WEEK. STARTED BACK SMOKING. LESS THAN A PACK A DAY. BOWELS-MAR 8 HAD A SPURT OF DIARRHEA AFTER EATING A BAKED SWEET POTATO. AFRAID TO EAT BECAUSE NOT SURE WHEN SHE'S GOING TO HAVE A BM. HAD TO TAKE IMODIUM AND NOW TOOK MIRALAX EVERY OTHER DAY.  DOESN'T EAT OUT MUCH. TAKING PROBIOTIC DAILY.  Past Medical History  Diagnosis Date  . Hypertension   . ASCVD (arteriosclerotic cardiovascular disease)     Neg. stress nuc. 3/99; cath in 4/04,-80%LAD; 70% RCA, -> DESx2; residual 60% distal RCA; nl EF  . Hyperkalemia     potassium of 5.1-5.3 in 2011  . Diabetes mellitus     Treated with oral agents; HgbA1c of 7in 08/2008  . Gout   . GERD (gastroesophageal reflux disease)   . Diverticulosis   . Hyperlipidemia   . Chronic kidney disease (CKD), stage III (moderate)     Creat.= 1.78 in 11/2009  . PVD (peripheral vascular disease)      moderate to severely decreased ABI's in 8/08, sig. aortic inflow disease; repeat ABI's in 2011 improved- mild disease on the left and moderate to severe disease on the right  . Cerebrovascular disease      mild; <50% ICA with moderate plaque bilaterally in 12/2009  . Asthmatic bronchitis   . Tobacco abuse     50 pack years continuing at 1/2 pack per day  . CHF (congestive heart failure)   . Arthritis   . Anemia   . Nervousness(799.21)   . Stroke 2012    Mini strokes- Temporary  blindness  . Myocardial infarction   . Coronary disease   . Arterial fibromuscular dysplasia   . Clostridium difficile colitis OCT 2014   Past Surgical History  Procedure Laterality Date  . Cholecystectomy    . Carpel tunnel release      bilateral  . Total abdominal hysterectomy w/ bilateral salpingoophorectomy    . Colonoscopy  2009  . Abdominal  hysterectomy      Allergies  Allergen Reactions  . Ace Inhibitors Hives, Shortness Of Breath and Swelling  . Aspirin Other (See Comments)    Blood in stool and nose bleed  . Codeine Swelling  . Plasticized Base [Plastibase] Hives and Swelling  . Ultram [Tramadol] Swelling  . Adhesive [Tape] Swelling and Rash  . Naprosyn [Naproxen] Swelling  . Linzess [Linaclotide]     DIARRHEA, FECAL INCONTINENCE  . Nsaids   . Varenicline Tartrate Swelling    Current Outpatient Prescriptions  Medication Sig Dispense Refill  . acetaminophen (TYLENOL EX ST ARTHRITIS PAIN) 500 MG tablet Take 500 mg by mouth every 6 (six) hours as needed.      . ALPRAZolam (XANAX) 0.25 MG tablet Take 1 tablet (0.25 mg total) by mouth at bedtime as needed.    Marland Kitchen aspirin 81 MG tablet Take 81 mg by mouth daily.      . calcium citrate-vitamin D (CITRACAL+D) 315-200 MG-UNIT per tablet Take 1 tablet by mouth daily.      . Cranberry 405 MG CAPS Take 1 capsule by mouth daily.      . diclofenac sodium (VOLTAREN) 1 % GEL Use the gel twice a day to knee joint    . DIOVAN  160 MG tablet TAKE TWO TABLETS BY MOUTH DAILY.    Marland Kitchen ezetimibe (ZETIA) 10 MG tablet Take 1 tablet (10 mg total) by mouth daily.    Marland Kitchen gabapentin (NEURONTIN) 300 MG capsule TAKE 2 CAPSULE BY MOUTH EVERY 8 HOURS FOR LEG PAIN.    Marland Kitchen lidocaine (LIDODERM) 5 % Place 1 patch onto the skin daily. Remove & Discard patch within 12 hours or as directed by MD    . NEXIUM 40 MG capsule TAKE 1 CAPSULE BY MOUTH ONCE DAILY FOR STOMACH.    Marland Kitchen polyethylene glycol (MIRALAX / GLYCOLAX) packet Take 17 g by mouth daily.    . pravastatin (PRAVACHOL) 40 MG tablet TAKE 1 TABLET BY MOUTH ONCE DAILY.    . Probiotic Product (PROBIOTIC DAILY PO) Take by mouth daily.          Review of Systems     Objective:   Physical Exam  Vitals reviewed. Constitutional: She is oriented to person, place, and time. She appears well-nourished. No distress.  HENT:  Head: Normocephalic and atraumatic.    Mouth/Throat: Oropharynx is clear and moist. No oropharyngeal exudate.  Eyes: Pupils are equal, round, and reactive to light. No scleral icterus.  Neck: Normal range of motion. Neck supple.  Cardiovascular: Normal rate, regular rhythm and normal heart sounds.   Pulmonary/Chest: Effort normal and breath sounds normal. No respiratory distress.  Abdominal: Soft. Bowel sounds are normal. She exhibits no distension. There is no tenderness.  Musculoskeletal: She exhibits no edema.  Lymphadenopathy:    She has no cervical adenopathy.  Neurological: She is alert and oriented to person, place, and time.  NO FOCAL DEFICITS   Psychiatric: She has a normal mood and affect.          Assessment & Plan:

## 2013-08-14 NOTE — Patient Instructions (Signed)
ADD CREON 2 WITH MEALS AND ONE WITH SNACKS. THIS MAY PREVENT EXPLOSIVE DIARRHEA.  MINIMIZE DAIRY INTAKE BUT IF YOU CONSUME DAIRY, USE LACTASE 3 OR 4 WITH MEALS WHEN USING DAIRY   CONTINUE DAILY PROBIOTIC.  CONTINUE MIRALAX EVERY OTHER DAY.  FOLLOW UP IN 4 MOS.

## 2013-08-14 NOTE — Assessment & Plan Note (Signed)
With MIXED SYMPTOMS. 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  ADD CREON 2 WITH MEALS AND ONE WITH SNACKS. CONTINUE LACTASE 3 OR 4 WITH MEALS WHEN USING DAIRY  CONTINUE DAILY PROBIOTIC CONTINUE MIRALAX QOD. FOLLOW UP IN 4 MOS.

## 2013-08-18 NOTE — Progress Notes (Signed)
Reminder in epic °

## 2013-08-28 ENCOUNTER — Other Ambulatory Visit: Payer: Self-pay | Admitting: Family Medicine

## 2013-08-28 NOTE — Telephone Encounter (Signed)
Refill appropriate and filled per protocol. 

## 2013-09-02 ENCOUNTER — Encounter: Payer: Self-pay | Admitting: Family

## 2013-09-03 ENCOUNTER — Encounter (HOSPITAL_COMMUNITY): Payer: Medicare Other

## 2013-09-03 ENCOUNTER — Ambulatory Visit (INDEPENDENT_AMBULATORY_CARE_PROVIDER_SITE_OTHER)
Admission: RE | Admit: 2013-09-03 | Discharge: 2013-09-03 | Disposition: A | Discharge status: Home / Self Care | Payer: Medicare HMO | Source: Ambulatory Visit | Attending: Family | Admitting: Family

## 2013-09-03 ENCOUNTER — Ambulatory Visit (HOSPITAL_COMMUNITY)
Admission: RE | Admit: 2013-09-03 | Discharge: 2013-09-03 | Disposition: A | Discharge status: Home / Self Care | Payer: Medicare HMO | Source: Ambulatory Visit | Attending: Family | Admitting: Family

## 2013-09-03 ENCOUNTER — Other Ambulatory Visit (HOSPITAL_COMMUNITY): Payer: Medicare Other

## 2013-09-03 ENCOUNTER — Ambulatory Visit (INDEPENDENT_AMBULATORY_CARE_PROVIDER_SITE_OTHER): Payer: Commercial Managed Care - HMO | Admitting: Family

## 2013-09-03 ENCOUNTER — Encounter: Payer: Self-pay | Admitting: Family

## 2013-09-03 ENCOUNTER — Ambulatory Visit: Payer: Medicare Other | Admitting: Family

## 2013-09-03 VITALS — BP 146/68 | HR 45 | Resp 16 | Ht 67.0 in | Wt 169.0 lb

## 2013-09-03 DIAGNOSIS — I6529 Occlusion and stenosis of unspecified carotid artery: Secondary | ICD-10-CM

## 2013-09-03 DIAGNOSIS — I739 Peripheral vascular disease, unspecified: Secondary | ICD-10-CM

## 2013-09-03 NOTE — Progress Notes (Signed)
[facility] [place] HISTORY AND PHYSICAL   MRN : [id number]  History of Present Illness:   Charlotte Henry is a 73 y.o. female patient of Dr. Scot Dock seen for a history of PVD without true claudication or rest pain and TIA's.  She returns today for carotid artery Duplex and ABI's.  Stopped her DM oral medication as her sugar was too low.  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  Her knees and feet hurt when she walks, but not her calves or thighs.  She has arthritis pain in her hips and knees exacerbated with walking.  Denies rest pain other than the neuropathy on the plantar surfaces of her feet.  Patient had a mini stroke in 2011 or 2012 and states she had [place] of neck at that time, was admitted to [facility]. She denies post prandial gastric pain.  Patient denies non-healing wounds.  She has not had any peripheral vascular intervention. Patient denies New Medical or Surgical History.  Is not walking much, no barriers to walking at present; she is will be starting shuffleboard, softball throw, and miniature golf.  Pt Diabetic: No, is no longer diabetic since she lost 60 + pounds.  Pt smoker: smoker (1 ppd x 50 yrs, resumed smoking cigarettes since 4 close friends died, about 5-6 cigarettes/day)  Pt meds include:  Statin :Yes  Betablocker: No  ASA: Yes  Other anticoagulants/antiplatelets: no  Current Outpatient Prescriptions  Medication Sig Dispense Refill  . acetaminophen (TYLENOL EX ST ARTHRITIS PAIN) 500 MG tablet Take 500 mg by mouth every 6 (six) hours as needed.        . ALPRAZolam (XANAX) 0.25 MG tablet Take 1 tablet (0.25 mg total) by mouth at bedtime as needed.  30 tablet  1  . aspirin 81 MG tablet Take 81 mg by mouth daily.        . calcium citrate-vitamin D (CITRACAL+D) 315-200 MG-UNIT per tablet Take 1 tablet by mouth daily.        . Cranberry 405 MG CAPS Take 1 capsule by mouth daily.        . diclofenac  sodium (VOLTAREN) 1 % GEL Use the gel twice a day to knee joint  1 Tube  3  . ezetimibe (ZETIA) 10 MG tablet Take 1 tablet (10 mg total) by mouth daily.  30 tablet  1  . gabapentin (NEURONTIN) 300 MG capsule TAKE 2 CAPSULE BY MOUTH EVERY 8 HOURS FOR LEG PAIN.  180 capsule  1  . lidocaine (LIDODERM) 5 % Place 1 patch onto the skin daily. Remove & Discard patch within 12 hours or as directed by MD  30 patch  2  . NEXIUM 40 MG capsule TAKE 1 CAPSULE BY MOUTH ONCE DAILY FOR STOMACH.  30 capsule  6  . Pancrelipase, Lip-Prot-Amyl, (CREON) 36000 UNITS CPEP Take 1 capsule by mouth with snacks. 2 PO WITH MEALS  300 capsule  11  . polyethylene glycol (MIRALAX / GLYCOLAX) packet Take 17 g by mouth daily.      . pravastatin (PRAVACHOL) 40 MG tablet TAKE 1 TABLET BY MOUTH ONCE DAILY.  90 tablet  1  . Probiotic Product (PROBIOTIC DAILY PO) Take by mouth daily.      . valsartan (DIOVAN) 160 MG tablet TAKE TWO TABLETS BY MOUTH DAILY.  90 tablet  2   No current facility-administered medications for this visit.    Past Medical History  Diagnosis Date  .  Hypertension   . ASCVD (arteriosclerotic cardiovascular disease)     Neg. stress nuc. 3/99; cath in 4/04,-80%LAD; 70% RCA, -> DESx2; residual 60% distal RCA; nl EF  . Hyperkalemia     potassium of 5.1-5.3 in 2011  . Diabetes mellitus     Treated with oral agents; HgbA1c of 7in 08/2008  . Gout   . GERD (gastroesophageal reflux disease)   . Diverticulosis   . Hyperlipidemia   . Chronic kidney disease (CKD), stage III (moderate)     Creat.= 1.78 in 11/2009  . PVD (peripheral vascular disease)      moderate to severely decreased ABI's in 8/08, sig. aortic inflow disease; repeat ABI's in 2011 improved- mild disease on the left and moderate to severe disease on the right  . Cerebrovascular disease      mild; <50% ICA with moderate plaque bilaterally in 12/2009  . Asthmatic bronchitis   . Tobacco abuse     50 pack years continuing at 1/2 pack per day  . CHF  (congestive heart failure)   . Arthritis   . Anemia   . Nervousness(799.21)   . Stroke 2012    Mini strokes- Temporary  blindness  . Myocardial infarction   . Coronary disease   . Arterial fibromuscular dysplasia   . Clostridium difficile colitis OCT 2014  . Carotid artery occlusion     Past Surgical History  Procedure Laterality Date  . Cholecystectomy    . Carpel tunnel release      bilateral  . Total abdominal hysterectomy w/ bilateral salpingoophorectomy    . Colonoscopy  2009  . Abdominal hysterectomy      Social History History  Substance Use Topics  . Smoking status: Former Smoker -- 1.00 packs/day for 50 years    Types: Cigarettes  . Smokeless tobacco: Never Used     Comment: 6 cig. a day  . Alcohol Use: 8.4 oz/week    14 Cans of beer per week    Family History Family History  Problem Relation Age of Onset  . Liver disease Mother 40  . Hypertension Mother   . Cerebral aneurysm Father   . Aneurysm Father 63  . Cancer Sister   . Liver disease Brother   . Liver disease Brother   . Liver disease Brother   . Diabetes type II Brother   . Alcohol abuse Brother   . Cancer Sister    Allergies  Allergen Reactions  . Ace Inhibitors Hives, Shortness Of Breath and Swelling  . Aspirin Other (See Comments)    Blood in stool and nose bleed  . Codeine Swelling  . Pineapple Shortness Of Breath and Swelling  . Plasticized Base [Plastibase] Hives and Swelling  . Ultram [Tramadol] Swelling  . Adhesive [Tape] Swelling and Rash  . Naprosyn [Naproxen] Swelling  . Linzess [Linaclotide]     DIARRHEA, FECAL INCONTINENCE  . Nsaids   . Varenicline Tartrate Swelling     REVIEW OF SYSTEMS: See HPI for pertinent positives and negatives.  Physical Examination Filed Vitals:   09/03/13 1513 09/03/13 1517  BP: 120/65 146/68  Pulse: 43 45  Resp:  16  Height:  5\' 7"  (1.702 m)  Weight:  169 lb (76.658 kg)  SpO2:  99%   Body mass index is 26.46 kg/(m^2).  General: A&O  x 3, WDWN.  Gait: slow, deliberate, mildly antalgic  Eyes: PERRLA,  Pulmonary: CTAB, without wheezes , rales or rhonchi.  Cardiac: regular Rythm , without murmur.  Carotid Bruits  Left  Right    Negative  Negative   Aorta is not palpable.  Bilateral radial pulses are palpable: 1+ left, 2+ right.  VASCULAR EXAM:  Extremities without ischemic changes  without Gangrene; without open wounds.Trace bilateral pretibial pitting edema.  LE Pulses  LEFT  RIGHT   FEMORAL  not palpable  palpable   POPLITEAL  not palpable  not palpable   POSTERIOR TIBIAL  not palpable  not palpable   DORSALIS PEDIS  ANTERIOR TIBIAL  not palpable  palpable   Abdomen: soft, NT, no masses.  Skin: no rashes, no ulcers noted.  Musculoskeletal: no muscle wasting or atrophy  Neurologic: A&O X 3; Appropriate Affect ; SENSATION: normal; MOTOR FUNCTION: moving all extremities equally, M/S 5/5 throughout. Speech is fluent/normal.  CN 2-12 intact.  Non-Invasive Vascular Imaging (09/03/2013):  ABI's: Right: 0.46, absent PT, monophasic DP; Left: 0.65, monophasic PT and DP. Previous (03/05/2013) ABI's: Right : 0.40, Right: 0.58  CEREBROVASCULAR DUPLEX EVALUATION    INDICATION: Follow-up carotid disease     PREVIOUS INTERVENTION(S):     DUPLEX EXAM:     RIGHT  LEFT  Peak Systolic Velocities (cm/s) End Diastolic Velocities (cm/s) Plaque LOCATION Peak Systolic Velocities (cm/s) End Diastolic Velocities (cm/s) Plaque  76 13  CCA PROXIMAL 100 21   73 18  CCA MID 82 17   81 16  CCA DISTAL 76 21   90 14  ECA 78 11   75 24 HT ICA PROXIMAL 84 29 HT  97 30  ICA MID 141 50   93 32  ICA DISTAL 96 31     .93 ICA / CCA Ratio (PSV) 1.9  Antegrade  Vertebral Flow Antegrade   902 Brachial Systolic Pressure (mmHg) 409  Within normal limits  Brachial Artery Waveforms Within normal limits     Plaque Morphology:  HM = Homogeneous, HT = Heterogeneous, CP = Calcific Plaque, SP = Smooth Plaque, IP = Irregular Plaque     ADDITIONAL  FINDINGS:     IMPRESSION: 1. Evidence of <40% stenosis of the bilateral internal carotid artery with mildly elevated velocities on the left due to tortuosity. Minimal plaque is present. 2. Bilateral vertebral artery is antegrade.    Compared to the previous exam:  No previous exam at this facility for comparison.       ASSESSMENT:  PATRCIA SCHNEPP is a 73 y.o. female with a history of PVD without true claudication or rest pain and TIA's in 2011 or 2012.  Her ABI'S have improved slightly from 6 months ago to severe and moderate arterial occlusive disease. Minimal bilateral ICA stenosis noted.  PLAN:   She was counseled re smoking cessation. Based on today's exam and non-invasive vascular lab results, the patient will follow up in 6 months with the following tests ABI's, 1 year for carotid Duplex. I discussed in depth with the patient the nature of atherosclerosis, and emphasized the importance of maximal medical management including strict control of blood pressure, blood glucose, and lipid levels, obtaining regular exercise, and cessation of smoking.  The patient is aware that without maximal medical management the underlying atherosclerotic disease process will progress, limiting the benefit of any interventions.  The patient was given information about stroke prevention and what symptoms should prompt the patient to seek immediate medical care.  The patient was given information about PAD including signs, symptoms, treatment, what symptoms should prompt the patient to seek immediate medical care, and risk reduction measures to take. Thank you for  allowing [place] to participate in this patient's care.  Clemon Chambers, RN, MSN, FNP-C [facility] Office: [phone]  Clinic MD: Scot Dock 09/03/2013 3:27 PM

## 2013-09-03 NOTE — Patient Instructions (Signed)
Stroke Prevention Some medical conditions and behaviors are associated with an increased chance of having a stroke. You may prevent a stroke by making healthy choices and managing medical conditions. HOW CAN I REDUCE MY RISK OF HAVING A STROKE?   Stay physically active. Get at least 30 minutes of activity on most or all days.  Do not smoke. It may also be helpful to avoid exposure to secondhand smoke.  Limit alcohol use. Moderate alcohol use is considered to be:  No more than 2 drinks per day for men.  No more than 1 drink per day for nonpregnant women.  Eat healthy foods. This involves  Eating 5 or more servings of fruits and vegetables a day.  Following a diet that addresses high blood pressure (hypertension), high cholesterol, diabetes, or obesity.  Manage your cholesterol levels.  A diet low in saturated fat, trans fat, and cholesterol and high in fiber may control cholesterol levels.  Take any prescribed medicines to control cholesterol as directed by your health care provider.  Manage your diabetes.  A controlled-carbohydrate, controlled-sugar diet is recommended to manage diabetes.  Take any prescribed medicines to control diabetes as directed by your health care provider.  Control your hypertension.  A low-salt (sodium), low-saturated fat, low-trans fat, and low-cholesterol diet is recommended to manage hypertension.  Take any prescribed medicines to control hypertension as directed by your health care provider.  Maintain a healthy weight.  A reduced-calorie, low-sodium, low-saturated fat, low-trans fat, low-cholesterol diet is recommended to manage weight.  Stop drug abuse.  Avoid taking birth control pills.  Talk to your health care provider about the risks of taking birth control pills if you are over 25 years old, smoke, get migraines, or have ever had a blood clot.  Get evaluated for sleep disorders (sleep apnea).  Talk to your health care provider about  getting a sleep evaluation if you snore a lot or have excessive sleepiness.  Take medicines as directed by your health care provider.  For some people, aspirin or blood thinners (anticoagulants) are helpful in reducing the risk of forming abnormal blood clots that can lead to stroke. If you have the irregular heart rhythm of atrial fibrillation, you should be on a blood thinner unless there is a good reason you cannot take them.  Understand all your medicine instructions.  Make sure that other other conditions (such as anemia or atherosclerosis) are addressed. SEEK IMMEDIATE MEDICAL CARE IF:   You have sudden weakness or numbness of the face, arm, or leg, especially on one side of the body.  Your face or eyelid droops to one side.  You have sudden confusion.  You have trouble speaking (aphasia) or understanding.  You have sudden trouble seeing in one or both eyes.  You have sudden trouble walking.  You have dizziness.  You have a loss of balance or coordination.  You have a sudden, severe headache with no known cause.  You have new chest pain or an irregular heartbeat. Any of these symptoms may represent a serious problem that is an emergency. Do not wait to see if the symptoms will go away. Get medical help at once. Call your local emergency services  (911 in [place] Do not drive yourself to the hospital. Document Released: 06/29/2004 Document Revised: 03/12/2013 Document Reviewed: 11/22/2012 [facility] Patient Information 2014 [place].   Endarterectoma femoral (Femoral Endarterectomy) Las arterias transportan la sangre desde el corazn a todo el organismo. La arteria femoral lleva sangre al muslo y a  la extremidad inferior, y se encuentra en la zona de la ingle. Puede obstruirse con una sustancia grasa (placa de ateroma), y esta afeccin se denomina enfermedad vascular perifrica. Puede causarle dolor en la pierna, sentirla fra o entumecida. Adems, puede provocarle  dolor en la pantorrilla cuando camina (claudicacin). Cuando la arteria femoral se obstruye, es necesario desobstruirla, y, para [place], se debe realizar una ciruga (endarterectoma). INFORME A SU MDICO SOBRE:   Alergias a alimentos o medicamentos.  Medicamentos que [place], incluyendo vitaminas, hierbas, gotas oftlmicas, medicamentos de venta libre y cremas.  Uso de corticoides (por va oral o cremas).  Problemas anteriores debido a anestsicos o a medicamentos que [employer] sensibilidad.  Antecedentes de hemorragias o cogulos sanguneos.  Cirugas previas.  Otros problemas de salud, incluyendo diabetes y problemas renales.  Posibilidad de embarazo, si correspondiera. RIESGOS Y COMPLICACIONES  Generalmente, no se presentan problemas despus de una endarterectoma femoral. Sin embargo, los posibles riesgos incluyen lo siguiente:  Sangrado que no se detiene.  Infeccin.  Lesiones nerviosas.  Cogulos sanguneos. Estos pueden formarse en las piernas. Los cogulos que se desprenden podran migrar al corazn, los pulmones o el cerebro.  Puede volver a formarse una placa de ateroma y causar otra obstruccin meses o aos despus de la [place]. ANTES DEL PROCEDIMIENTO   Se le har una evaluacin mdica. Este puede incluir:  Examen fsico. Este incluye preguntas sobre su salud general en la actualidad y en el pasado. Le controlarn la presin arterial y la frecuencia cardaca.  Anlis[place] de [place].  Ecografa Doppler. Este estudio [place] ondas sonoras para controlar la circulacin de sangre por la arteria femoral y la pierna.  Angiografa. Este estudio es un tipo de Runner, broadcasting/film/video. Se le inyecta un contraste en la sangre que se ver en la radiografa.  Hable con un anestesilogo. Habitualmente, se [place] un medicamento para dormirlo (anestesia general). Sin embargo, si se [place] anestesia raqudea, estar anestesiado de la cintura para abajo. Durante el procedimiento, estar somnoliento,  Advertising account executive. Pregunte al mdico lo que debe esperar.  Es posible que tenga que tomar medicamentos para prevenir la formacin de cogulos. Para esto se usan aspirinas o anticoagulantes.  Tendr que firmar un papel legal que concede permiso para la ciruga (consentimiento informado).  El da anterior a la [place], cene liviano. No debe comer ni beber nada durante por lo menos las 6horas previas a la [place]. Consulte con su mdico si puede tomar los [employer] necesita con un sorbo de [place].  Llegue por lo menos 1hora antes de la [place] o segn su mdico le indique. Esto le dar tiempo para registrarse y Science writer todos los formularios necesarios. PROCEDIMIENTO   Le colocarn en el cuerpo unos monitores pequeos. Estos controlarn su corazn, la presin arterial y Retail buyer de oxgeno.  Le colocarn una va intravenosa (IV). Le pasarn medicamentos por va intravenosa.  Si se [place] anestesia general, le colocarn un tubo en la garganta para ayudarlo a respirar [employer].  Se limpiar la zona de la ingle con una solucin antisptica.  Una vez que se haya dormido, el cirujano har un corte (una incisin) en la zona de la ingle por encima de la arteria femoral que suele tener pocas pulgadas de [place].  Se puede colocar una pinza por encima y por debajo de la obstruccin para impedir que la sangre circule por la zona.  Se hace un corte en la arteria para abrirla. Se retira la placa de ateroma de las paredes internas  de la arteria.  Durante la [place], se puede realizar una angiografa que permite que el cirujano sepa cul es la extensin de la obstruccin. Ms adelante, Cindra Presume estudio puede mostrar si se ha podido extraer toda la placa de ateroma.  Se cerrar la arteria; luego, se retirar la pinza. La sangre puede volver a circular nuevamente por la arteria.  El cirujano cerrar la incisin. Se usarn grapas o puntos.  Se colocar un vendaje con medicamento  (apsito) sobre la incisin. DESPUS DEL PROCEDIMIENTO   Debe permanecer en un rea de recuperacin hasta que desaparezca el efecto de la anestesia. Controlarn su presin arterial y el pulso. Luego lo llevarn a una habitacin del hospital.  Continuar recibiendo lquidos por va intravenosa durante un [place].  Es normal sentir Scientist, research (life sciences). Le administrarn medicamentos para calmar el dolor cuando se encuentre en el rea de recuperacin. Si el dolor empeora, asegrese de avisarle a su mdico.  Antes de que regrese a su casa, se puede realizar nuevamente una angiografa, para comprobar que la sangre est circulando por la arteria como es debido.  La mayora de las Scientist, research (medical) en el hospital durante 1 o 2das despus de este procedimiento. [place] los medicamentos que su mdico le recet. [place]. Los medicamentos pueden incluir anticoagulantes y Educational psychologist.  No tome ningn analgsico de venta libre, salvo que su mdico lo autorice.  No conduzca si est tomando analgsicos narcticos.  [place] despus de la [place]. Pregunte cundo puede tomar [place]. No se d un bao de inmersin durante por lo menos 2semanas.  Tendr que volver para que le saquen los puntos (las suturas) o las grapas. Generalmente, esto se hace aproximadamente una semana despus de la [place].  Caminar. Esto es importante. Ayudar a Mining engineer formacin de cogulos de [place].  Vuelva a su rutina normal poco a poco. Consulte con su mdico si tiene preguntas sobre Pension scheme manager. Pregunte cundo va a estar bien para conducir vehculos.  Pregntele a su mdico cundo puede volver a Fish farm manager. Esto depender del tipo de trabajo que usted realice.  No fume. SOLICITE ATENCIN MDICA SI:   Tiene dudas relacionadas con los medicamentos.  El dolor contina, aun despus de haber  tomado analgsicos.  Tiene dolor o calambres en la pierna cuando camina.  Tiene una temperatura bucal mayor a 38,9 C (102 F). SOLICITE ATENCIN MDICA DE INMEDIATO SI:   El dolor empeora.  Le falta el aire.  Siente dolor en el pecho.  La zona alrededor de la incisin se pone roja y se hincha.  Hay secrecin de sangre u otros lquidos de la incisin.  La pierna se le pone roja, se hincha o le duele.  La pierna o el pie cambian de color o se entumecen.  La temperatura bucal le sube a ms de 38,9 C (102 F), y no puede bajarla con medicamentos. ASEGRESE DE QUE:   Comprende estas instrucciones.  Controlar su afeccin.  Recibir ayuda de inmediato si no mejora o si empeora. Document Released: 03/12/2013 [facility] Patient Information 2014 [place], [place].

## 2013-09-19 ENCOUNTER — Other Ambulatory Visit: Payer: Self-pay | Admitting: Adult Health

## 2013-09-19 ENCOUNTER — Other Ambulatory Visit: Payer: Self-pay | Admitting: Cardiology

## 2013-09-19 ENCOUNTER — Other Ambulatory Visit: Payer: Self-pay | Admitting: Family Medicine

## 2013-09-19 NOTE — Telephone Encounter (Signed)
Medication refilled per protocol. 

## 2013-09-22 ENCOUNTER — Telehealth: Payer: Self-pay | Admitting: Cardiovascular Disease

## 2013-09-22 NOTE — Telephone Encounter (Signed)
Nexium refill to GI for further refills and f/u

## 2013-09-22 NOTE — Telephone Encounter (Signed)
Needs refill on Nexium sent to pharmacy. / tgs

## 2013-09-23 ENCOUNTER — Telehealth: Payer: Self-pay | Admitting: Cardiovascular Disease

## 2013-09-23 MED ORDER — ESOMEPRAZOLE MAGNESIUM 40 MG PO CPDR: DELAYED_RELEASE_CAPSULE | ORAL | Status: DC

## 2013-09-23 NOTE — Telephone Encounter (Signed)
Medication sent via escribe.  

## 2013-09-23 NOTE — Telephone Encounter (Signed)
Patient is calling back regarding her Nexium.  Message was sent yesterday for refill.  Stated that she needed to have GI fill it.  She does not have GI doctor.  Our office has always refilled this for her. / tgs

## 2013-09-24 ENCOUNTER — Telehealth: Payer: Self-pay | Admitting: *Deleted

## 2013-09-24 NOTE — Telephone Encounter (Signed)
Received PA determination.    PA denied.   MD to be made aware.

## 2013-09-24 NOTE — Telephone Encounter (Signed)
Please let pt know that she will have to pay for the lidoderm patches out of pocket As medicare wont cover, unfortunately, unless she takes something like percocet, she is allergic to everything else

## 2013-09-24 NOTE — Telephone Encounter (Signed)
Received PA request for Lidocaine patches.   PA submitted.

## 2013-09-24 NOTE — Telephone Encounter (Signed)
Continue the voltaren gel and Ex Tylenol as needed

## 2013-09-24 NOTE — Telephone Encounter (Signed)
Patient made aware.   Advised that out of pocket cost to be $215.  States that she cannot take percocet either.   MD please advise.

## 2013-09-25 NOTE — Telephone Encounter (Signed)
Call placed to patient and patient made aware.  

## 2013-10-01 ENCOUNTER — Ambulatory Visit (INDEPENDENT_AMBULATORY_CARE_PROVIDER_SITE_OTHER): Payer: Commercial Managed Care - HMO | Admitting: Family Medicine

## 2013-10-01 ENCOUNTER — Encounter: Payer: Self-pay | Admitting: Family Medicine

## 2013-10-01 VITALS — BP 126/68 | HR 76 | Temp 98.0°F | Resp 18 | Ht 64.0 in | Wt 173.0 lb

## 2013-10-01 DIAGNOSIS — I6529 Occlusion and stenosis of unspecified carotid artery: Secondary | ICD-10-CM

## 2013-10-01 DIAGNOSIS — G609 Hereditary and idiopathic neuropathy, unspecified: Secondary | ICD-10-CM

## 2013-10-01 DIAGNOSIS — IMO0002 Reserved for concepts with insufficient information to code with codable children: Secondary | ICD-10-CM

## 2013-10-01 DIAGNOSIS — I10 Essential (primary) hypertension: Secondary | ICD-10-CM

## 2013-10-01 DIAGNOSIS — G629 Polyneuropathy, unspecified: Secondary | ICD-10-CM

## 2013-10-01 DIAGNOSIS — K589 Irritable bowel syndrome without diarrhea: Secondary | ICD-10-CM

## 2013-10-01 DIAGNOSIS — E785 Hyperlipidemia, unspecified: Secondary | ICD-10-CM

## 2013-10-01 DIAGNOSIS — M1710 Unilateral primary osteoarthritis, unspecified knee: Secondary | ICD-10-CM

## 2013-10-01 DIAGNOSIS — M179 Osteoarthritis of knee, unspecified: Secondary | ICD-10-CM

## 2013-10-01 DIAGNOSIS — E119 Type 2 diabetes mellitus without complications: Secondary | ICD-10-CM

## 2013-10-01 LAB — COMPLETE METABOLIC PANEL WITH GFR
ALT: 10 U/L (ref 0–35)
AST: 16 U/L (ref 0–37)
Albumin: 3.9 g/dL (ref 3.5–5.2)
Alkaline Phosphatase: 54 U/L (ref 39–117)
BUN: 16 mg/dL (ref 6–23)
CO2: 27 mEq/L (ref 19–32)
Calcium: 9.3 mg/dL (ref 8.4–10.5)
Chloride: 108 mEq/L (ref 96–112)
Creat: 0.92 mg/dL (ref 0.50–1.10)
GFR, Est African American: 71 mL/min
GFR, Est Non African American: 62 mL/min
Glucose, Bld: 107 mg/dL — ABNORMAL HIGH (ref 70–99)
Potassium: 4.8 mEq/L (ref 3.5–5.3)
Sodium: 142 mEq/L (ref 135–145)
Total Bilirubin: 0.3 mg/dL (ref 0.3–1.2)
Total Protein: 6.4 g/dL (ref 6.0–8.3)

## 2013-10-01 LAB — LIPID PANEL
Cholesterol: 113 mg/dL (ref 0–200)
HDL: 41 mg/dL (ref >39)
LDL Cholesterol: 53 mg/dL (ref 0–99)
Total CHOL/HDL Ratio: 2.8 Ratio
Triglycerides: 95 mg/dL (ref <150)
VLDL: 19 mg/dL (ref 0–40)

## 2013-10-01 LAB — CBC WITH DIFFERENTIAL/PLATELET
Basophils Absolute: 0 10*3/uL (ref 0.0–0.1)
Basophils Relative: 0 % (ref 0–1)
Eosinophils Absolute: 0.3 10*3/uL (ref 0.0–0.7)
Eosinophils Relative: 4 % (ref 0–5)
HCT: 34.6 % — ABNORMAL LOW (ref 36.0–46.0)
Hemoglobin: 11.4 g/dL — ABNORMAL LOW (ref 12.0–15.0)
Lymphocytes Relative: 39 % (ref 12–46)
Lymphs Abs: 2.7 10*3/uL (ref 0.7–4.0)
MCH: 29.3 pg (ref 26.0–34.0)
MCHC: 32.9 g/dL (ref 30.0–36.0)
MCV: 88.9 fL (ref 78.0–100.0)
Monocytes Absolute: 0.8 10*3/uL (ref 0.1–1.0)
Monocytes Relative: 11 % (ref 3–12)
Neutro Abs: 3.1 10*3/uL (ref 1.7–7.7)
Neutrophils Relative %: 46 % (ref 43–77)
Platelets: 238 10*3/uL (ref 150–400)
RBC: 3.89 MIL/uL (ref 3.87–5.11)
RDW: 14.8 % (ref 11.5–15.5)
WBC: 6.9 10*3/uL (ref 4.0–10.5)

## 2013-10-01 LAB — HEMOGLOBIN A1C
Hgb A1c MFr Bld: 6.7 % — ABNORMAL HIGH (ref <5.7)
Mean Plasma Glucose: 146 mg/dL — ABNORMAL HIGH (ref <117)

## 2013-10-01 MED ORDER — LUBIPROSTONE 8 MCG PO CAPS: 24.0000 ug | ORAL_CAPSULE | Freq: Two times a day (BID) | ORAL | Status: DC

## 2013-10-01 NOTE — Assessment & Plan Note (Signed)
F/u with GI, given amitiza 49mcg BID to try

## 2013-10-01 NOTE — Assessment & Plan Note (Signed)
Check liver function tests and her fasting lipid panel continue pravastatin and zetia which she has tolerated

## 2013-10-01 NOTE — Assessment & Plan Note (Signed)
Well controlled, note she has been on 1 diovan once a day

## 2013-10-01 NOTE — Assessment & Plan Note (Signed)
Diet control check A1c

## 2013-10-01 NOTE — Progress Notes (Signed)
Patient ID: Charlotte Henry, female   DOB: August 13, 1940, 73 y.o.   MRN: [id number]   Subjective:    Patient ID: Charlotte Henry, female    DOB: 05/29/1941, 73 y.o.   MRN: [id number]  Patient presents for 4 month F/U  patient here to followup chronic medical problems. She has no new concerns. Her lidocaine was not covered by her insurance even after appeal She does use full tear in jail however this wears off. She does continue to have difficulties with her bowels she is followup with her gastroenterologist. She states when she takes MiraLAX even 2-3 times a week it will cause diarrhea for a few days which she tries to decrease the dose it "gripes" her stomach. She is due for fasting labs for diabetes mellitus and hyperlipidemia. Her diabetes is diet controlled    Review Of Systems:  GEN- denies fatigue, fever, weight loss,weakness, recent illness HEENT- denies eye drainage, change in vision, nasal discharge, CVS- denies chest pain, palpitations RESP- denies SOB, cough, wheeze ABD- denies N/V, change in stools, abd pain GU- denies dysuria, hematuria, dribbling, incontinence MSK- + joint pain, muscle aches, injury Neuro- denies headache, dizziness, syncope, seizure activity       Objective:    BP 126/68  Pulse 76  Temp(Src) 98 F (36.7 C) (Oral)  Resp 18  Ht 5\' 4"  (1.626 m)  Wt 173 lb (78.472 kg)  BMI 29.68 kg/m2 GEN- NAD, alert and oriented x3 HEENT- PERRL, EOMI, non injected sclera, pink conjunctiva, MMM, oropharynx clear CVS- RRR, no murmur RESP-CTAB EXT- No edema Pulses- Radial, DP- decreased bilat        Assessment & Plan:      Problem List Items Addressed This Visit   None      Note: This dictation was prepared with Dragon dictation along with smaller phrase technology. Any transcriptional errors that result from this process are unintentional.

## 2013-10-01 NOTE — Patient Instructions (Signed)
Try the amitiza for the constipation Schedule with Dr. Oneida Alar We will call with lab results F/U 3 months

## 2013-10-01 NOTE — Assessment & Plan Note (Signed)
Increase voltarento TID She will pay out of pocket for patches

## 2013-10-01 NOTE — Assessment & Plan Note (Signed)
Unchanged, has in feet and hands, on gabapentin

## 2013-10-04 ENCOUNTER — Other Ambulatory Visit: Payer: Self-pay | Admitting: Adult Health

## 2013-10-04 ENCOUNTER — Other Ambulatory Visit: Payer: Self-pay | Admitting: Family Medicine

## 2013-10-06 ENCOUNTER — Other Ambulatory Visit: Payer: Self-pay | Admitting: *Deleted

## 2013-10-06 ENCOUNTER — Telehealth: Payer: Self-pay | Admitting: *Deleted

## 2013-10-06 DIAGNOSIS — K589 Irritable bowel syndrome without diarrhea: Secondary | ICD-10-CM

## 2013-10-06 MED ORDER — EPINEPHRINE 0.3 MG/0.3ML IJ SOAJ: 0.3000 mg | Freq: Once | INTRAMUSCULAR | Status: DC

## 2013-10-06 MED ORDER — LUBIPROSTONE 8 MCG PO CAPS: 24.0000 ug | ORAL_CAPSULE | Freq: Two times a day (BID) | ORAL | Status: DC

## 2013-10-06 NOTE — Telephone Encounter (Signed)
Okay to order Epi pen

## 2013-10-06 NOTE — Telephone Encounter (Signed)
Refill appropriate and filled per protocol. 

## 2013-10-06 NOTE — Telephone Encounter (Signed)
Received call from patient.   Requested order for Epi-pen due to allergy.   MD please advise.

## 2013-10-06 NOTE — Telephone Encounter (Signed)
Call placed to patient and patient made aware.   Prescription sent to pharmacy.  

## 2013-10-15 ENCOUNTER — Encounter: Payer: Self-pay | Admitting: Cardiovascular Disease

## 2013-10-15 ENCOUNTER — Ambulatory Visit (INDEPENDENT_AMBULATORY_CARE_PROVIDER_SITE_OTHER): Payer: Commercial Managed Care - HMO | Admitting: Cardiovascular Disease

## 2013-10-15 VITALS — BP 110/58 | HR 67 | Ht 64.0 in | Wt 168.0 lb

## 2013-10-15 DIAGNOSIS — I498 Other specified cardiac arrhythmias: Secondary | ICD-10-CM

## 2013-10-15 DIAGNOSIS — Z955 Presence of coronary angioplasty implant and graft: Secondary | ICD-10-CM

## 2013-10-15 DIAGNOSIS — I2510 Atherosclerotic heart disease of native coronary artery without angina pectoris: Secondary | ICD-10-CM

## 2013-10-15 DIAGNOSIS — R5383 Other fatigue: Secondary | ICD-10-CM

## 2013-10-15 DIAGNOSIS — E785 Hyperlipidemia, unspecified: Secondary | ICD-10-CM

## 2013-10-15 DIAGNOSIS — I10 Essential (primary) hypertension: Secondary | ICD-10-CM | POA: Diagnosis not present

## 2013-10-15 DIAGNOSIS — R001 Bradycardia, unspecified: Secondary | ICD-10-CM

## 2013-10-15 DIAGNOSIS — R5381 Other malaise: Secondary | ICD-10-CM

## 2013-10-15 DIAGNOSIS — Z9861 Coronary angioplasty status: Secondary | ICD-10-CM

## 2013-10-15 DIAGNOSIS — I739 Peripheral vascular disease, unspecified: Secondary | ICD-10-CM

## 2013-10-15 NOTE — Patient Instructions (Signed)
Your physician recommends that you schedule a follow-up appointment in: 3-4 months with Dr Virgina Jock will receive a reminder letter two months in advance reminding you to call and schedule your appointment. If you don't receive this letter, please contact our office.  Your physician recommends that you continue on your current medications as directed. Please refer to the Current Medication list given to you today.

## 2013-10-15 NOTE — Progress Notes (Signed)
Patient ID: Charlotte Henry, female   DOB: 1941-05-07, 73 y.o.   MRN: [id number]      SUBJECTIVE: The patient is a 73 year old woman with a history of coronary artery disease. She underwent drug-eluting stent placement to the LAD and RCA in 2004. She also has a history of diabetes mellitus, hypertension, and hyperlipidemia. She denies chest pain and shortness of breath. She tries to stay active by playing mini golf, doing softball throws, and shuffleboard. She said she stays tired. She thinks it may be due to peripheral vascular disease, which is followed by [facility]. Carotid Dopplers in April showed less than 40% bilateral stenosis. ABIs in April showed severe occlusive disease of the right lower extremity and moderate occlusive disease of the left lower extremity.  ECG in the office today shows sinus bradycardia, heart rate 50 beats per minute, first degree AV block with PR interval of 294 ms, right bundle branch block, and nonspecific ST segment abnormality in inferior leads.  Allergies  Allergen Reactions  . Ace Inhibitors Hives, Shortness Of Breath and Swelling  . Aspirin Other (See Comments)    Blood in stool and nose bleed  . Codeine Swelling  . Pineapple Shortness Of Breath and Swelling  . Plasticized Base [Plastibase] Hives and Swelling  . Ultram [Tramadol] Swelling  . Adhesive [Tape] Swelling and Rash  . Naprosyn [Naproxen] Swelling  . Linzess [Linaclotide]     DIARRHEA, FECAL INCONTINENCE  . Nsaids   . Varenicline Tartrate Swelling    Current Outpatient Prescriptions  Medication Sig Dispense Refill  . acetaminophen (TYLENOL EX ST ARTHRITIS PAIN) 500 MG tablet Take 500 mg by mouth every 6 (six) hours as needed.        . ALPRAZolam (XANAX) 0.25 MG tablet Take 1 tablet (0.25 mg total) by mouth at bedtime as needed.  30 tablet  1  . aspirin 81 MG tablet Take 81 mg by mouth daily.        . calcium citrate-vitamin D (CITRACAL+D) 315-200 MG-UNIT per tablet Take 1 tablet by mouth  daily.        . Cranberry 405 MG CAPS Take 1 capsule by mouth daily.        Marland Kitchen EPINEPHrine 0.3 mg/0.3 mL IJ SOAJ injection Inject 0.3 mLs (0.3 mg total) into the muscle once.  1 Device  3  . esomeprazole (NEXIUM) 40 MG capsule TAKE 1 CAPSULE BY MOUTH ONCE DAILY FOR STOMACH.  30 capsule  0  . gabapentin (NEURONTIN) 300 MG capsule TAKE 2 CAPSULE BY MOUTH EVERY 8 HOURS FOR LEG PAIN.  180 capsule  3  . lidocaine (LIDODERM) 5 % Place 1 patch onto the skin daily. Remove & Discard patch within 12 hours or as directed by MD  30 patch  2  . lubiprostone (AMITIZA) 8 MCG capsule Take 3 capsules (24 mcg total) by mouth 2 (two) times daily with a meal.  180 capsule  3  . Pancrelipase, Lip-Prot-Amyl, (CREON) 36000 UNITS CPEP Take 1 capsule by mouth with snacks. 2 PO WITH MEALS  300 capsule  11  . polyethylene glycol (MIRALAX / GLYCOLAX) packet Take 17 g by mouth daily.      . pravastatin (PRAVACHOL) 40 MG tablet TAKE 1 TABLET BY MOUTH ONCE DAILY.  90 tablet  0  . Probiotic Product (PROBIOTIC DAILY PO) Take by mouth daily.      . valsartan (DIOVAN) 160 MG tablet Take 1 tablet once a day      . VOLTAREN 1 %  GEL APPLY TO KNEE JOINT 2 TIMES A DAY.  100 g  2  . ZETIA 10 MG tablet TAKE 1 TABLET BY MOUTH ONCE A DAY.  30 tablet  0   No current facility-administered medications for this visit.    Past Medical History  Diagnosis Date  . Hypertension   . ASCVD (arteriosclerotic cardiovascular disease)     Neg. stress nuc. 3/99; cath in 4/04,-80%LAD; 70% RCA, -> DESx2; residual 60% distal RCA; nl EF  . Hyperkalemia     potassium of 5.1-5.3 in 2011  . Diabetes mellitus     Treated with oral agents; HgbA1c of 7in 08/2008  . Gout   . GERD (gastroesophageal reflux disease)   . Diverticulosis   . Hyperlipidemia   . Chronic kidney disease (CKD), stage III (moderate)     Creat.= 1.78 in 11/2009  . PVD (peripheral vascular disease)      moderate to severely decreased ABI's in 8/08, sig. aortic inflow disease; repeat  ABI's in 2011 improved- mild disease on the left and moderate to severe disease on the right  . Cerebrovascular disease      mild; <50% ICA with moderate plaque bilaterally in 12/2009  . Asthmatic bronchitis   . Tobacco abuse     50 pack years continuing at 1/2 pack per day  . CHF (congestive heart failure)   . Arthritis   . Anemia   . Nervousness(799.21)   . Stroke 2012    Mini strokes- Temporary  blindness  . Myocardial infarction   . Coronary disease   . Arterial fibromuscular dysplasia   . Clostridium difficile colitis OCT 2014  . Carotid artery occlusion     Past Surgical History  Procedure Laterality Date  . Cholecystectomy    . Carpel tunnel release      bilateral  . Total abdominal hysterectomy w/ bilateral salpingoophorectomy    . Colonoscopy  2009  . Abdominal hysterectomy      History   Social History  . Marital Status: Widowed    Spouse Name: N/A    Number of Children: N/A  . Years of Education: N/A   Occupational History  . Not on file.   Social History Main Topics  . Smoking status: Former Smoker -- 1.00 packs/day for 50 years    Types: Cigarettes  . Smokeless tobacco: Never Used     Comment: 6 cig. a day  . Alcohol Use: 8.4 oz/week    14 Cans of beer per week  . Drug Use: No  . Sexual Activity: Not on file   Other Topics Concern  . Not on file   Social History Narrative  . No narrative on file    BP 110/58 Pulse 67    PHYSICAL EXAM General: NAD Neck: No JVD, no thyromegaly. Lungs: Clear to auscultation bilaterally with normal respiratory effort. CV: Nondisplaced PMI.  Bradycardic, regular rhythm, normal S1/S2, no Z6/X0, I/VI systolic murmur along left sternal border. No pretibial or periankle edema.  No carotid bruit.  Abdomen: Soft, nontender, no hepatosplenomegaly, no distention.  Neurologic: Alert and oriented x 3.  Psych: Normal affect. Extremities: No clubbing or cyanosis.   ECG: reviewed and available in electronic  records.      ASSESSMENT AND PLAN: 1. CAD: Symptomatically stable on current medical therapy. No indication for noninvasive testing at this time. Continue aspirin, Zetia, and pravastatin. 2. HTN: Well controlled on current therapy. Continue current dose of valsartan. 3. Hyperlipidemia: Recent lipid panel showed triglycerides 95, HDL  41, LDL 53. No change in current therapy which includes Zetia and pravastatin. 4. Bradycardia and fatigue: We spoke at length about possible Holter monitoring and stress testing. She said she monitors her heart rate at home and it is usually in the 60 beat per minute range. Earlier in the office today, heart rate was 67 beats per minute. I will monitor her for the time being and follow up with her in 3-4 months.  5. Peripheral vascular disease: Followed by [facility]. Noninvasive testing results as noted above.  Dispo: f/u 3-4 months.  Kate Sable, M.D., F.A.C.C.

## 2013-10-23 ENCOUNTER — Telehealth: Payer: Self-pay | Admitting: Family Medicine

## 2013-10-23 ENCOUNTER — Telehealth: Payer: Self-pay | Admitting: Adult Health

## 2013-10-23 MED ORDER — EZETIMIBE 10 MG PO TABS: ORAL_TABLET | ORAL | Status: DC

## 2013-10-23 NOTE — Telephone Encounter (Signed)
Medication sent via escribe.  

## 2013-10-23 NOTE — Telephone Encounter (Signed)
Received fax refill request  Rx # [id number] Medication:  Zetia 10 mg tablets  Qty 30 Sig:  Take one tablet by mouth once a day Physician:  Purcell Nails

## 2013-10-23 NOTE — Telephone Encounter (Signed)
Pt is needing a refill on  ezetimibe (ZETIA) 10 MG tablet States the pharmacy said they will not refill till she is seen at office last office visit was 4/29 Call back numbers are  [phone]  Pharmacy [facility]

## 2013-10-23 NOTE — Telephone Encounter (Signed)
Zetia request sent to patient cardiologist. Medication refilled with #30, 3 refills.   Call placed to patient and patient made aware.

## 2013-12-19 ENCOUNTER — Other Ambulatory Visit: Payer: Self-pay | Admitting: Cardiovascular Disease

## 2013-12-24 ENCOUNTER — Telehealth: Payer: Self-pay | Admitting: Adult Health

## 2013-12-24 MED ORDER — PRAVASTATIN SODIUM 40 MG PO TABS: ORAL_TABLET | ORAL | Status: DC

## 2013-12-24 NOTE — Telephone Encounter (Signed)
Received fax refill request  Rx # [id number]  Medication:  Pravastatin 40 mg tablet Qty 90 Sig:  Take one tablet by mouth once daily Physician:  Purcell Nails   Received fax refill request  Rx # [id number] Medication:  Nexium 40 mg capsule  Qty 30 Sig:  Take one capsule by mouth once daily for stomach Physician:  Bronson Ing

## 2013-12-25 ENCOUNTER — Telehealth: Payer: Self-pay | Admitting: Family Medicine

## 2013-12-25 ENCOUNTER — Other Ambulatory Visit: Payer: Self-pay | Admitting: Family Medicine

## 2013-12-25 NOTE — Telephone Encounter (Signed)
Received request from pharmacy and medication refilled.   Call placed to patient and patient made aware.

## 2013-12-25 NOTE — Telephone Encounter (Signed)
Refill appropriate and filled per protocol. 

## 2013-12-25 NOTE — Telephone Encounter (Signed)
Patient said that the pharmacy has faxed [place] at least 3 times for her nexium and she has not heard anything back please call her back at [phone] Pharmacy is [facility]

## 2013-12-31 ENCOUNTER — Encounter: Payer: Self-pay | Admitting: Gastroenterology

## 2013-12-31 ENCOUNTER — Encounter: Payer: Self-pay | Admitting: Family Medicine

## 2013-12-31 ENCOUNTER — Ambulatory Visit (INDEPENDENT_AMBULATORY_CARE_PROVIDER_SITE_OTHER): Payer: Commercial Managed Care - HMO | Admitting: Family Medicine

## 2013-12-31 VITALS — BP 126/72 | HR 62 | Temp 98.2°F | Resp 12 | Ht 65.0 in | Wt 171.0 lb

## 2013-12-31 DIAGNOSIS — I10 Essential (primary) hypertension: Secondary | ICD-10-CM

## 2013-12-31 DIAGNOSIS — E1159 Type 2 diabetes mellitus with other circulatory complications: Secondary | ICD-10-CM

## 2013-12-31 DIAGNOSIS — N3281 Overactive bladder: Secondary | ICD-10-CM | POA: Insufficient documentation

## 2013-12-31 DIAGNOSIS — K589 Irritable bowel syndrome without diarrhea: Secondary | ICD-10-CM

## 2013-12-31 DIAGNOSIS — M1711 Unilateral primary osteoarthritis, right knee: Secondary | ICD-10-CM

## 2013-12-31 DIAGNOSIS — M1710 Unilateral primary osteoarthritis, unspecified knee: Secondary | ICD-10-CM

## 2013-12-31 DIAGNOSIS — R350 Frequency of micturition: Secondary | ICD-10-CM

## 2013-12-31 LAB — URINALYSIS, ROUTINE W REFLEX MICROSCOPIC
BILIRUBIN URINE: NEGATIVE
Glucose, UA: NEGATIVE mg/dL
Hgb urine dipstick: NEGATIVE
KETONES UR: NEGATIVE mg/dL
Leukocytes, UA: NEGATIVE
Nitrite: NEGATIVE
PH: 6 (ref 5.0–8.0)
Protein, ur: NEGATIVE mg/dL
Specific Gravity, Urine: 1.015 (ref 1.005–1.030)
UROBILINOGEN UA: 0.2 mg/dL (ref 0.0–1.0)

## 2013-12-31 LAB — BASIC METABOLIC PANEL
BUN: 18 mg/dL (ref 6–23)
CHLORIDE: 109 meq/L (ref 96–112)
CO2: 25 meq/L (ref 19–32)
Calcium: 9.6 mg/dL (ref 8.4–10.5)
Creat: 0.93 mg/dL (ref 0.50–1.10)
Glucose, Bld: 98 mg/dL (ref 70–99)
Potassium: 4.5 mEq/L (ref 3.5–5.3)
SODIUM: 141 meq/L (ref 135–145)

## 2013-12-31 LAB — HEMOGLOBIN A1C
Hgb A1c MFr Bld: 6.7 % — ABNORMAL HIGH (ref <5.7)
MEAN PLASMA GLUCOSE: 146 mg/dL — AB (ref <117)

## 2013-12-31 NOTE — Assessment & Plan Note (Signed)
UA to be done Consider myrtebiq if no glucose normal and no evidence of infection, to help with OAB

## 2013-12-31 NOTE — Assessment & Plan Note (Signed)
Declined cortisone shot Unable to tolerate other meds such as ultram/hydrocodone

## 2013-12-31 NOTE — Patient Instructions (Signed)
I will call with lab results and urine sample Amitiza is now 1 capsule twice a day- Call Dr. Dr Oneida Alar Hold on the bladder pill until I tell you F/U 4 months

## 2013-12-31 NOTE — Progress Notes (Signed)
Patient ID: Charlotte Henry, female   DOB: [birth year], 73 y.o.   MRN: [id number]   Subjective:    Patient ID: Charlotte Henry, female    DOB: 07/10/1940, 73 y.o.   MRN: [id number]  Patient presents for 3 month F/U  patient here to follow chronic medical problems. She complains of knee pain on and off. She is history of osteoarthritis. This past week is been difficult for her she's been hopping along the knee she's not had any falls. She's had minimal swelling. She's been using Voltaren gel which helps some. She also has a cane but she does not like to rely on this.  She also complains of urinary frequency worse over the past month. She denies any dysuria but occasionally gets some discomfort in her left lower back. She's not had any hematuria. She states that she also has frequency at night and has to get up often and quickly before she has any accidents.  Constipation/IBSshe's given Amitiza at her last visit she is actually taking 24 mg twice a day this moves her bowels but then she has diarrhea so she takes an Imodium afterwards. She was also concerned her Creon may of been causing some constipation though she only takes typically one to 2 tablets a day  Cardiology note reviewed  Review Of Systems:  GEN- denies fatigue, fever, weight loss,weakness, recent illness HEENT- denies eye drainage, change in vision, nasal discharge, CVS- denies chest pain, palpitations RESP- denies SOB, cough, wheeze ABD- denies N/V,+ change in stools, abd pain GU- denies dysuria, hematuria, dribbling, incontinence MSK- +joint pain, muscle aches, injury Neuro- denies headache, dizziness, syncope, seizure activity       Objective:    BP 126/72  Pulse 62  Temp(Src) 98.2 F (36.8 C) (Oral)  Resp 12  Ht 5\' 5"  (1.651 m)  Wt 171 lb (77.565 kg)  BMI 28.46 kg/m2 GEN- NAD, alert and oriented x3 HEENT- PERRL, EOMI, non injected sclera, pink conjunctiva, MMM, oropharynx clear CVS- bradycardia no  murmur RESP-CTAB ABD-NABS,soft,NT,ND, no CVA tenderness MSK- Mild swelling Right knee, decreased ROM, antalgic gait, ligaments in tact EXT-trace pedal edema Pulses- Radial 2+, DP-1+        Assessment & Plan:      Problem List Items Addressed This Visit   Urinary frequency     UA to be done Consider myrtebiq if no glucose normal and no evidence of infection, to help with OAB    OA (osteoarthritis) of knee     Declined cortisone shot Unable to tolerate other meds such as ultram/hydrocodone    IBS (irritable bowel syndrome)     Decrease amitiza to 29mcg twice a day F/u with GI    Relevant Medications      lubiprostone (AMITIZA) 8 MCG capsule   HYPERTENSION     Well controlled    Relevant Orders      Basic metabolic panel   Diabetes mellitus - Primary     Repeat A1C with the polyuria she has been having, currently diet controlled    Relevant Orders      Urinalysis, Routine w reflex microscopic (Completed)      Hemoglobin A1c      Note: This dictation was prepared with Dragon dictation along with smaller phrase technology. Any transcriptional errors that result from this process are unintentional.

## 2013-12-31 NOTE — Assessment & Plan Note (Signed)
Well controlled 

## 2013-12-31 NOTE — Assessment & Plan Note (Signed)
Decrease amitiza to 66mcg twice a day F/u with GI

## 2013-12-31 NOTE — Assessment & Plan Note (Signed)
Repeat A1C with the polyuria she has been having, currently diet controlled

## 2014-02-02 ENCOUNTER — Ambulatory Visit (INDEPENDENT_AMBULATORY_CARE_PROVIDER_SITE_OTHER): Payer: Medicare HMO | Admitting: Cardiovascular Disease

## 2014-02-02 ENCOUNTER — Encounter: Payer: Self-pay | Admitting: Cardiovascular Disease

## 2014-02-02 VITALS — BP 108/62 | HR 62 | Ht 67.0 in | Wt 166.0 lb

## 2014-02-02 DIAGNOSIS — R29898 Other symptoms and signs involving the musculoskeletal system: Secondary | ICD-10-CM

## 2014-02-02 DIAGNOSIS — Z9861 Coronary angioplasty status: Secondary | ICD-10-CM

## 2014-02-02 DIAGNOSIS — G629 Polyneuropathy, unspecified: Secondary | ICD-10-CM

## 2014-02-02 DIAGNOSIS — I2510 Atherosclerotic heart disease of native coronary artery without angina pectoris: Secondary | ICD-10-CM

## 2014-02-02 DIAGNOSIS — I739 Peripheral vascular disease, unspecified: Secondary | ICD-10-CM

## 2014-02-02 DIAGNOSIS — Z955 Presence of coronary angioplasty implant and graft: Secondary | ICD-10-CM

## 2014-02-02 DIAGNOSIS — I6529 Occlusion and stenosis of unspecified carotid artery: Secondary | ICD-10-CM

## 2014-02-02 DIAGNOSIS — E785 Hyperlipidemia, unspecified: Secondary | ICD-10-CM

## 2014-02-02 DIAGNOSIS — G609 Hereditary and idiopathic neuropathy, unspecified: Secondary | ICD-10-CM

## 2014-02-02 DIAGNOSIS — I10 Essential (primary) hypertension: Secondary | ICD-10-CM

## 2014-02-02 NOTE — Patient Instructions (Signed)
Your physician wants you to follow-up in: 6 months You will receive a reminder letter in the mail two months in advance. If you don't receive a letter, please call our office to schedule the follow-up appointment.     Your physician recommends that you continue on your current medications as directed. Please refer to the Current Medication list given to you today.      Thank you for choosing [facility] !        

## 2014-02-02 NOTE — Progress Notes (Signed)
Patient ID: Charlotte Henry, female   DOB: [birth year], 73 y.o.   MRN: [id number]      SUBJECTIVE: The patient is a 73 year old woman with a history of coronary artery disease and PVD. She underwent drug-eluting stent placement to the LAD and RCA in 2004. She also has a history of diabetes mellitus, hypertension, and hyperlipidemia.  Carotid Dopplers in April 2015 showed less than 40% bilateral stenosis.  ABIs in April 2015 showed severe occlusive disease of the right lower extremity and moderate occlusive disease of the left lower extremity, and is followed by vascular surgery. She denies chest pain and rarely gets short of breath. She is primarily limited in her activities by decreased leg strength and weakness bilaterally. Recent HbA1C 6.7%.   Review of Systems: As per "subjective", otherwise negative.  Allergies  Allergen Reactions  . Ace Inhibitors Hives, Shortness Of Breath and Swelling  . Aspirin Other (See Comments)    Blood in stool and nose bleed  . Codeine Swelling  . Pineapple Shortness Of Breath and Swelling  . Plasticized Base [Plastibase] Hives and Swelling  . Ultram [Tramadol] Swelling  . Adhesive [Tape] Swelling and Rash  . Naprosyn [Naproxen] Swelling  . Linzess [Linaclotide]     DIARRHEA, FECAL INCONTINENCE  . Nsaids   . Varenicline Tartrate Swelling    Current Outpatient Prescriptions  Medication Sig Dispense Refill  . ALPRAZolam (XANAX) 0.25 MG tablet Take 1 tablet (0.25 mg total) by mouth at bedtime as needed.  30 tablet  1  . aspirin 81 MG tablet Take 81 mg by mouth daily.        . calcium citrate-vitamin D (CITRACAL+D) 315-200 MG-UNIT per tablet Take 1 tablet by mouth daily.        Marland Kitchen EPINEPHrine 0.3 mg/0.3 mL IJ SOAJ injection Inject 0.3 mLs (0.3 mg total) into the muscle once.  1 Device  3  . ezetimibe (ZETIA) 10 MG tablet TAKE 1 TABLET BY MOUTH ONCE A DAY.  30 tablet  3  . gabapentin (NEURONTIN) 300 MG capsule TAKE 2 CAPSULE BY MOUTH EVERY 8 HOURS FOR LEG  PAIN.  180 capsule  3  . lubiprostone (AMITIZA) 8 MCG capsule Take 8 mcg by mouth 2 (two) times daily with a meal.      . mirabegron ER (MYRBETRIQ) 25 MG TB24 tablet Take 25 mg by mouth daily.      Marland Kitchen NEXIUM 40 MG capsule TAKE 1 CAPSULE BY MOUTH ONCE DAILY FOR STOMACH.  30 capsule  6  . pravastatin (PRAVACHOL) 40 MG tablet TAKE 1 TABLET BY MOUTH ONCE DAILY.  90 tablet  0  . Probiotic Product (PROBIOTIC DAILY PO) Take by mouth daily.      . valsartan (DIOVAN) 160 MG tablet Take 1 tablet once a day      . VOLTAREN 1 % GEL APPLY TO KNEE JOINT 2 TIMES A DAY.  100 g  2   No current facility-administered medications for this visit.    Past Medical History  Diagnosis Date  . Hypertension   . ASCVD (arteriosclerotic cardiovascular disease)     Neg. stress nuc. 3/99; cath in 4/04,-80%LAD; 70% RCA, -> DESx2; residual 60% distal RCA; nl EF  . Hyperkalemia     potassium of 5.1-5.3 in 2011  . Diabetes mellitus     Treated with oral agents; HgbA1c of 7in 08/2008  . Gout   . GERD (gastroesophageal reflux disease)   . Diverticulosis   . Hyperlipidemia   . Chronic kidney  disease (CKD), stage III (moderate)     Creat.= 1.78 in 11/2009  . PVD (peripheral vascular disease)      moderate to severely decreased ABI's in 8/08, sig. aortic inflow disease; repeat ABI's in 2011 improved- mild disease on the left and moderate to severe disease on the right  . Cerebrovascular disease      mild; <50% ICA with moderate plaque bilaterally in 12/2009  . Asthmatic bronchitis   . Tobacco abuse     50 pack years continuing at 1/2 pack per day  . CHF (congestive heart failure)   . Arthritis   . Anemia   . Nervousness(799.21)   . Stroke 2012    Mini strokes- Temporary  blindness  . Myocardial infarction   . Coronary disease   . Arterial fibromuscular dysplasia   . Clostridium difficile colitis OCT 2014  . Carotid artery occlusion     Past Surgical History  Procedure Laterality Date  . Cholecystectomy    .  Carpel tunnel release      bilateral  . Total abdominal hysterectomy w/ bilateral salpingoophorectomy    . Colonoscopy  2009  . Abdominal hysterectomy      History   Social History  . Marital Status: Widowed    Spouse Name: N/A    Number of Children: N/A  . Years of Education: N/A   Occupational History  . Not on file.   Social History Main Topics  . Smoking status: Former Smoker -- 1.00 packs/day for 50 years    Types: Cigarettes  . Smokeless tobacco: Never Used     Comment: 6 cig. a day  . Alcohol Use: 8.4 oz/week    14 Cans of beer per week  . Drug Use: No  . Sexual Activity: Not on file   Other Topics Concern  . Not on file   Social History Narrative  . No narrative on file    BP 108/62  Pulse 62    PHYSICAL EXAM General: NAD  Neck: No JVD, no thyromegaly.  Lungs: Clear to auscultation bilaterally with normal respiratory effort.  CV: Nondisplaced PMI. Bradycardic, regular rhythm, normal S1/S2, no Z6/X0, I/VI systolic murmur along left sternal border. No pretibial or periankle edema. No carotid bruit. Diminished pedal pulses b/l. Abdomen: Soft, nontender, no hepatosplenomegaly, no distention.  Neurologic: Alert and oriented x 3.  Psych: Normal affect.  Extremities: No clubbing or cyanosis.    ECG: Most recent ECG reviewed.    ASSESSMENT AND PLAN: 1. CAD: Stable ischemic heart disease on current medical therapy. No indication for noninvasive testing at this time. Continue aspirin, Zetia, and pravastatin.  2. Essential HTN: Well controlled on current therapy. Continue current dose of valsartan.  3. Hyperlipidemia: Lipid panel on 10/01/13 showed TC 113, triglycerides 95, HDL 41, LDL 53. No change in current therapy which includes Zetia and pravastatin.  4. Bradycardia and fatigue: Stable at this time. If symptoms recur, would consider Holter monitoring and stress testing.  5. Peripheral vascular disease with b/l leg weakness: Has symptoms related to this and  reportedly has an appt in October with [facility]. Noninvasive testing results as noted above.   Dispo: f/u 6 months.   Kate Sable, M.D., F.A.C.C.

## 2014-02-04 ENCOUNTER — Ambulatory Visit (INDEPENDENT_AMBULATORY_CARE_PROVIDER_SITE_OTHER): Payer: Commercial Managed Care - HMO | Admitting: Gastroenterology

## 2014-02-04 ENCOUNTER — Encounter: Payer: Self-pay | Admitting: Gastroenterology

## 2014-02-04 VITALS — BP 113/65 | HR 58 | Temp 98.7°F | Ht 67.0 in | Wt 170.2 lb

## 2014-02-04 DIAGNOSIS — Z1211 Encounter for screening for malignant neoplasm of colon: Secondary | ICD-10-CM | POA: Insufficient documentation

## 2014-02-04 DIAGNOSIS — K589 Irritable bowel syndrome without diarrhea: Secondary | ICD-10-CM

## 2014-02-04 NOTE — Assessment & Plan Note (Signed)
HIGH RISK-TCS DUE 2012  TCS OCT/NOV 2015(MOVIPREP) PER PT REQUEST. WILL NEED OVERTUBE FOR TCS.

## 2014-02-04 NOTE — Progress Notes (Signed)
NIC'D FOR TCS IN NOV AND ALSO 6 MONTH FU OV WITH [facility]

## 2014-02-04 NOTE — Progress Notes (Signed)
Subjective:    Patient ID: Charlotte Henry, female    DOB: 08/14/1940, 73 y.o.   MRN: [id number]  [name], MD  HPI TOOK Creon got constipated. PLACED ON AMITIZA 8 MCG 3 PO BID AND GOT MONSTROUS DIARRHEA. NOW ON AMITIZA 8 MCG 1 BID. NOW TAKING 2 WITH ONE MEAL AND ONE WITH A SNACK. BOWEL MORE REGULAR. NO DIARRHEA. BLOATING: NONE. BMs;2-3X/WEEK. DOESN'T EAT A FULL MEAL EVERY DAY.      Past Medical History  Diagnosis Date  . Hypertension   . ASCVD (arteriosclerotic cardiovascular disease)     Neg. stress nuc. 3/99; cath in 4/04,-80%LAD; 70% RCA, -> DESx2; residual 60% distal RCA; nl EF  . Hyperkalemia     potassium of 5.1-5.3 in 2011  . Diabetes mellitus     Treated with oral agents; HgbA1c of 7in 08/2008  . Gout   . GERD (gastroesophageal reflux disease)   . Diverticulosis   . Hyperlipidemia   . Chronic kidney disease (CKD), stage III (moderate)     Creat.= 1.78 in 11/2009  . PVD (peripheral vascular disease)      moderate to severely decreased ABI's in 8/08, sig. aortic inflow disease; repeat ABI's in 2011 improved- mild disease on the left and moderate to severe disease on the right  . Cerebrovascular disease      mild; <50% ICA with moderate plaque bilaterally in 12/2009  . Asthmatic bronchitis   . Tobacco abuse     50 pack years continuing at 1/2 pack per day  . CHF (congestive heart failure)   . Arthritis   . Anemia   . Nervousness(799.21)   . Stroke 2012    Mini strokes- Temporary  blindness  . Myocardial infarction   . Coronary disease   . Arterial fibromuscular dysplasia   . Clostridium difficile colitis OCT 2014  . Carotid artery occlusion     Past Surgical History  Procedure Laterality Date  . Cholecystectomy    . Carpel tunnel release      bilateral  . Total abdominal hysterectomy w/ bilateral salpingoophorectomy    . Colonoscopy  2009  . Abdominal hysterectomy      Allergies  Allergen Reactions  . Ace Inhibitors Hives, Shortness Of Breath and  Swelling  . Aspirin Other (See Comments)    Blood in stool and nose bleed  . Codeine Swelling  . Pineapple Shortness Of Breath and Swelling  . Plasticized Base [Plastibase] Hives and Swelling  . Ultram [Tramadol] Swelling  . Adhesive [Tape] Swelling and Rash  . Naprosyn [Naproxen] Swelling  . Linzess [Linaclotide]     DIARRHEA, FECAL INCONTINENCE  . Nsaids   . Varenicline Tartrate Swelling    Current Outpatient Prescriptions  Medication Sig Dispense Refill  . ALPRAZolam (XANAX) 0.25 MG tablet Take 1 tablet (0.25 mg total) by mouth at bedtime as needed.    Marland Kitchen aspirin 81 MG tablet Take 81 mg by mouth daily.      . calcium citrate-vitamin D (CITRACAL+D) 315-200 MG-UNIT per tablet Take 1 tablet by mouth daily.      Marland Kitchen ezetimibe (ZETIA) 10 MG tablet TAKE 1 TABLET BY MOUTH ONCE A DAY.    Marland Kitchen gabapentin (NEURONTIN) 300 MG capsule TAKE 2 CAPSULE BY MOUTH EVERY 8 HOURS FOR LEG PAIN.    Marland Kitchen lubiprostone (AMITIZA) 8 MCG capsule Take 8 mcg by mouth 2 (two) times daily with a meal.    . NEXIUM 40 MG capsule TAKE 1 CAPSULE BY MOUTH ONCE DAILY FOR  STOMACH.    Marland Kitchen Pancrelipase, Lip-Prot-Amyl, (CREON PO) Take 36,000 Units by mouth. 2 caps po with ONE meal and 1 with A snacks    . pravastatin (PRAVACHOL) 40 MG tablet TAKE 1 TABLET BY MOUTH ONCE DAILY.    . Probiotic Product (PROBIOTIC DAILY PO) Take by mouth daily.    . valsartan (DIOVAN) 160 MG tablet Take 1 tablet once a day    . VOLTAREN 1 % GEL APPLY TO KNEE JOINT 2 TIMES A DAY.    Marland Kitchen EPINEPHrine 0.3 mg/0.3 mL IJ SOAJ injection Inject 0.3 mLs (0.3 mg total) into the muscle once.    . mirabegron ER (MYRBETRIQ) 25 MG TB24 tablet Take 25 mg by mouth daily.         Review of Systems     Objective:   Physical Exam  Vitals reviewed. Constitutional: She is oriented to person, place, and time. She appears well-developed and well-nourished. No distress.  HENT:  Head: Normocephalic and atraumatic.  Mouth/Throat: Oropharynx is clear and moist. No  oropharyngeal exudate.  Eyes: Pupils are equal, round, and reactive to light. No scleral icterus.  Neck: Normal range of motion. Neck supple.  Cardiovascular: Normal rate, regular rhythm and normal heart sounds.   Pulmonary/Chest: Effort normal and breath sounds normal. No respiratory distress.  Abdominal: Soft. Bowel sounds are normal. She exhibits no distension. There is no tenderness.  Musculoskeletal: She exhibits no edema.  Lymphadenopathy:    She has no cervical adenopathy.  Neurological: She is alert and oriented to person, place, and time.  NO  NEW FOCAL DEFICITS   Psychiatric: She has a normal mood and affect.          Assessment & Plan:

## 2014-02-04 NOTE — Patient Instructions (Signed)
CONTINUE LACTASE, PROBIOTIC, CREON AND AMITIZA.  THE OFFICE WILL CALL YOU IN OCT TO SCHEDULE YOUR TCS.  FOLLOW UP IN 6 MOS. MERRY CHRISTMAS AND HAPPY NEW YEAR!

## 2014-02-04 NOTE — Assessment & Plan Note (Signed)
Mixed symptoms IMPROVED WITH LACTASE, PROBIOTIC, CREON, AND AMITIZA.  CONTINUE TO MONITOR SYMPTOMS. FOLLOW UP IN 6 MOS. MERRY CHRISTMAS AND HAPPY NEW YEAR!

## 2014-02-04 NOTE — Progress Notes (Signed)
Cc to pcp °

## 2014-02-12 ENCOUNTER — Telehealth: Payer: Self-pay | Admitting: Adult Health

## 2014-02-12 MED ORDER — EZETIMIBE 10 MG PO TABS: ORAL_TABLET | ORAL | Status: DC

## 2014-02-12 NOTE — Telephone Encounter (Signed)
Needs refill on Zetia sent to [facility]. / tgs

## 2014-02-12 NOTE — Telephone Encounter (Signed)
Medication sent to pharmacy  

## 2014-02-18 ENCOUNTER — Telehealth: Payer: Self-pay | Admitting: *Deleted

## 2014-02-18 MED ORDER — LUBIPROSTONE 8 MCG PO CAPS: 8.0000 ug | ORAL_CAPSULE | Freq: Two times a day (BID) | ORAL | Status: DC

## 2014-02-18 MED ORDER — VALSARTAN 160 MG PO TABS: ORAL_TABLET | ORAL | Status: DC

## 2014-02-18 NOTE — Telephone Encounter (Signed)
Received fax requesting clarification orders on Amitiza and Valsartan.   Prescription sent to pharmacy.

## 2014-02-23 ENCOUNTER — Telehealth: Payer: Self-pay | Admitting: Family Medicine

## 2014-02-23 NOTE — Telephone Encounter (Signed)
Remo Lipps from [facility] [facility] is calling to get some clarification on med for this patient

## 2014-02-23 NOTE — Telephone Encounter (Signed)
Call placed to [facility] at [facility].   Reports that patient was requesting Diabetic neuropathy cream.   Advised that MD approved and faxed the request back to pharmacy.   Verbal order given.

## 2014-02-25 ENCOUNTER — Telehealth: Payer: Self-pay | Admitting: *Deleted

## 2014-02-25 NOTE — Telephone Encounter (Signed)
Submitted [employer] referral thru [facility] for authorization with authorization [id number], faxed authorization to dr. Althea Charon at [facility] at [phone] [facility]

## 2014-03-10 ENCOUNTER — Encounter: Payer: Self-pay | Admitting: Family

## 2014-03-11 ENCOUNTER — Ambulatory Visit (INDEPENDENT_AMBULATORY_CARE_PROVIDER_SITE_OTHER): Payer: Commercial Managed Care - HMO | Admitting: Family

## 2014-03-11 ENCOUNTER — Ambulatory Visit (HOSPITAL_COMMUNITY)
Admission: RE | Admit: 2014-03-11 | Discharge: 2014-03-11 | Disposition: A | Discharge status: Home / Self Care | Payer: Medicare HMO | Source: Ambulatory Visit | Attending: Family | Admitting: Family

## 2014-03-11 ENCOUNTER — Encounter: Payer: Self-pay | Admitting: Family

## 2014-03-11 ENCOUNTER — Other Ambulatory Visit (HOSPITAL_COMMUNITY): Payer: Commercial Managed Care - HMO

## 2014-03-11 VITALS — BP 113/71 | HR 51 | Resp 14 | Ht 67.0 in | Wt 164.0 lb

## 2014-03-11 DIAGNOSIS — I739 Peripheral vascular disease, unspecified: Secondary | ICD-10-CM | POA: Insufficient documentation

## 2014-03-11 DIAGNOSIS — I6529 Occlusion and stenosis of unspecified carotid artery: Secondary | ICD-10-CM | POA: Insufficient documentation

## 2014-03-11 DIAGNOSIS — I6523 Occlusion and stenosis of bilateral carotid arteries: Secondary | ICD-10-CM

## 2014-03-11 NOTE — Patient Instructions (Addendum)
Stroke Prevention Some medical conditions and behaviors are associated with an increased chance of having a stroke. You may prevent a stroke by making healthy choices and managing medical conditions. HOW CAN I REDUCE MY RISK OF HAVING A STROKE?   Stay physically active. Get at least 30 minutes of activity on most or all days.  Do not smoke. It may also be helpful to avoid exposure to secondhand smoke.  Limit alcohol use. Moderate alcohol use is considered to be:  No more than 2 drinks per day for men.  No more than 1 drink per day for nonpregnant women.  Eat healthy foods. This involves:  Eating 5 or more servings of fruits and vegetables a day.  Making dietary changes that address high blood pressure (hypertension), high cholesterol, diabetes, or obesity.  Manage your cholesterol levels.  Making food choices that are high in fiber and low in saturated fat, trans fat, and cholesterol may control cholesterol levels.  Take any prescribed medicines to control cholesterol as directed by your health care provider.  Manage your diabetes.  Controlling your carbohydrate and sugar intake is recommended to manage diabetes.  Take any prescribed medicines to control diabetes as directed by your health care provider.  Control your hypertension.  Making food choices that are low in salt (sodium), saturated fat, trans fat, and cholesterol is recommended to manage hypertension.  Take any prescribed medicines to control hypertension as directed by your health care provider.  Maintain a healthy weight.  Reducing calorie intake and making food choices that are low in sodium, saturated fat, trans fat, and cholesterol are recommended to manage weight.  Stop drug abuse.  Avoid taking birth control pills.  Talk to your health care provider about the risks of taking birth control pills if you are over 35 years old, smoke, get migraines, or have ever had a blood clot.  Get evaluated for sleep  disorders (sleep apnea).  Talk to your health care provider about getting a sleep evaluation if you snore a lot or have excessive sleepiness.  Take medicines only as directed by your health care provider.  For some people, aspirin or blood thinners (anticoagulants) are helpful in reducing the risk of forming abnormal blood clots that can lead to stroke. If you have the irregular heart rhythm of atrial fibrillation, you should be on a blood thinner unless there is a good reason you cannot take them.  Understand all your medicine instructions.  Make sure that other conditions (such as anemia or atherosclerosis) are addressed. SEEK IMMEDIATE MEDICAL CARE IF:   You have sudden weakness or numbness of the face, arm, or leg, especially on one side of the body.  Your face or eyelid droops to one side.  You have sudden confusion.  You have trouble speaking (aphasia) or understanding.  You have sudden trouble seeing in one or both eyes.  You have sudden trouble walking.  You have dizziness.  You have a loss of balance or coordination.  You have a sudden, severe headache with no known cause.  You have new chest pain or an irregular heartbeat. Any of these symptoms may represent a serious problem that is an emergency. Do not wait to see if the symptoms will go away. Get medical help at once. Call your local emergency services (911 in [place] Do not drive yourself to the hospital. Document Released: 06/29/2004 Document Revised: 10/06/2013 Document Reviewed: 11/22/2012 [employer] Patient Information 2015 [employer]. This information is not intended to replace advice given   to you by your health care provider. Make sure you discuss any questions you have with your health care provider.   Peripheral Vascular Disease Peripheral Vascular Disease (PVD), also called Peripheral Arterial Disease (PAD), is a circulation problem caused by cholesterol (atherosclerotic plaque) deposits in the arteries.  PVD commonly occurs in the lower extremities (legs) but it can occur in other areas of the body, such as your arms. The cholesterol buildup in the arteries reduces blood flow which can cause pain and other serious problems. The presence of PVD can place a person at risk for Coronary Artery Disease (CAD).  CAUSES  Causes of PVD can be many. It is usually associated with more than one risk factor such as:   High Cholesterol.  Smoking.  Diabetes.  Lack of exercise or inactivity.  High blood pressure (hypertension).  Obesity.  Family history. SYMPTOMS   When the lower extremities are affected, patients with PVD may experience:  Leg pain with exertion or physical activity. This is called INTERMITTENT CLAUDICATION. This may present as cramping or numbness with physical activity. The location of the pain is associated with the level of blockage. For example, blockage at the abdominal level (distal abdominal aorta) may result in buttock or hip pain. Lower leg arterial blockage may result in calf pain.  As PVD becomes more severe, pain can develop with less physical activity.  In people with severe PVD, leg pain may occur at rest.  Other PVD signs and symptoms:  Leg numbness or weakness.  Coldness in the affected leg or foot, especially when compared to the other leg.  A change in leg color.  Patients with significant PVD are more prone to ulcers or sores on toes, feet or legs. These may take longer to heal or may reoccur. The ulcers or sores can become infected.  If signs and symptoms of PVD are ignored, gangrene may occur. This can result in the loss of toes or loss of an entire limb.  Not all leg pain is related to PVD. Other medical conditions can cause leg pain such as:  Blood clots (embolism) or Deep Vein Thrombosis.  Inflammation of the blood vessels (vasculitis).  Spinal stenosis. DIAGNOSIS  Diagnosis of PVD can involve several different types of tests. These can  include:  Pulse Volume Recording Method (PVR). This test is simple, painless and does not involve the use of X-rays. PVR involves measuring and comparing the blood pressure in the arms and legs. An ABI (Ankle-Brachial Index) is calculated. The normal ratio of blood pressures is 1. As this number becomes smaller, it indicates more severe disease.  < 0.95 - indicates significant narrowing in one or more leg vessels.  <0.8 - there will usually be pain in the foot, leg or buttock with exercise.  <0.4 - will usually have pain in the legs at rest.  <0.25 - usually indicates limb threatening PVD.  Doppler detection of pulses in the legs. This test is painless and checks to see if you have a pulses in your legs/feet.  A dye or contrast material (a substance that highlights the blood vessels so they show up on x-ray) may be given to help your caregiver better see the arteries for the following tests. The dye is eliminated from your body by the kidney's. Your caregiver may order blood work to check your kidney function and other laboratory values before the following tests are performed:  Magnetic Resonance Angiography (MRA). An MRA is a picture study of the blood vessels  and arteries. The MRA machine uses a large magnet to produce images of the blood vessels.  Computed Tomography Angiography (CTA). A CTA is a specialized x-ray that looks at how the blood flows in your blood vessels. An IV may be inserted into your arm so contrast dye can be injected.  Angiogram. Is a procedure that uses x-rays to look at your blood vessels. This procedure is minimally invasive, meaning a small incision (cut) is made in your groin. A small tube (catheter) is then inserted into the artery of your groin. The catheter is guided to the blood vessel or artery your caregiver wants to examine. Contrast dye is injected into the catheter. X-rays are then taken of the blood vessel or artery. After the images are obtained, the  catheter is taken out. TREATMENT  Treatment of PVD involves many interventions which may include:  Lifestyle changes:  Quitting smoking.  Exercise.  Following a low fat, low cholesterol diet.  Control of diabetes.  Foot care is very important to the PVD patient. Good foot care can help prevent infection.  Medication:  Cholesterol-lowering medicine.  Blood pressure medicine.  Anti-platelet drugs.  Certain medicines may reduce symptoms of Intermittent Claudication.  Interventional/Surgical options:  Angioplasty. An Angioplasty is a procedure that inflates a balloon in the blocked artery. This opens the blocked artery to improve blood flow.  Stent Implant. A wire mesh tube (stent) is placed in the artery. The stent expands and stays in place, allowing the artery to remain open.  Peripheral Bypass Surgery. This is a surgical procedure that reroutes the blood around a blocked artery to help improve blood flow. This type of procedure may be performed if Angioplasty or stent implants are not an option. SEEK IMMEDIATE MEDICAL CARE IF:   You develop pain or numbness in your arms or legs.  Your arm or leg turns cold, becomes blue in color.  You develop redness, warmth, swelling and pain in your arms or legs. MAKE SURE YOU:   Understand these instructions.  Will watch your condition.  Will get help right away if you are not doing well or get worse. Document Released: 06/29/2004 Document Revised: 08/14/2011 Document Reviewed: 05/26/2008 [facility] Patient Information 2015 [place], [place]. This information is not intended to replace advice given to you by your health care provider. Make sure you discuss any questions you have with your health care provider.   Smoking Cessation Quitting smoking is important to your health and has many advantages. However, it is not always easy to quit since nicotine is a very addictive drug. Oftentimes, people try 3 times or more before being able  to quit. This document explains the best ways for you to prepare to quit smoking. Quitting takes hard work and a lot of effort, but you can do it. ADVANTAGES OF QUITTING SMOKING  You will live longer, feel better, and live better.  Your body will feel the impact of quitting smoking almost immediately.  Within 20 minutes, blood pressure decreases. Your pulse returns to its normal level.  After 8 hours, carbon monoxide levels in the blood return to normal. Your oxygen level increases.  After 24 hours, the chance of having a heart attack starts to decrease. Your breath, hair, and body stop smelling like smoke.  After 48 hours, damaged nerve endings begin to recover. Your sense of taste and smell improve.  After 72 hours, the body is virtually free of nicotine. Your bronchial tubes relax and breathing becomes easier.  After 2 to  12 weeks, lungs can hold more air. Exercise becomes easier and circulation improves.  The risk of having a heart attack, stroke, cancer, or lung disease is greatly reduced.  After 1 year, the risk of coronary heart disease is cut in half.  After 5 years, the risk of stroke falls to the same as a nonsmoker.  After 10 years, the risk of lung cancer is cut in half and the risk of other cancers decreases significantly.  After 15 years, the risk of coronary heart disease drops, usually to the level of a nonsmoker.  If you are pregnant, quitting smoking will improve your chances of having a healthy baby.  The people you live with, especially any children, will be healthier.  You will have extra money to spend on things other than cigarettes. QUESTIONS TO THINK ABOUT BEFORE ATTEMPTING TO QUIT You may want to talk about your answers with your health care provider.  Why do you want to quit?  If you tried to quit in the past, what helped and what did not?  What will be the most difficult situations for you after you quit? How will you plan to handle them?  Who  can help you through the tough times? Your family? Friends? A health care provider?  What pleasures do you get from smoking? What ways can you still get pleasure if you quit? Here are some questions to ask your health care provider:  How can you help me to be successful at quitting?  What medicine do you think would be best for me and how should I take it?  What should I do if I need more help?  What is smoking withdrawal like? How can I get information on withdrawal? GET READY  Set a quit date.  Change your environment by getting rid of all cigarettes, ashtrays, matches, and lighters in your home, car, or work. Do not let people smoke in your home.  Review your past attempts to quit. Think about what worked and what did not. GET SUPPORT AND ENCOURAGEMENT You have a better chance of being successful if you have help. You can get support in many ways.  Tell your family, friends, and coworkers that you are going to quit and need their support. Ask them not to smoke around you.  Get individual, group, or telephone counseling and support. Programs are available at [employer] and health centers. Call your local health department for information about programs in your area.  Spiritual beliefs and practices may help some smokers quit.  Download a "quit meter" on your computer to keep track of quit statistics, such as how long you have gone without smoking, cigarettes not smoked, and money saved.  Get a self-help book about quitting smoking and staying off tobacco. [place] yourself from urges to smoke. Talk to someone, go for a walk, or occupy your time with a task.  Change your normal routine. Take a different route to work. Drink tea instead of coffee. Eat breakfast in a different place.  Reduce your stress. Take a hot bath, exercise, or read a book.  Plan something enjoyable to do every day. Reward yourself for not smoking.  Explore  interactive web-based programs that specialize in helping you quit. GET MEDICINE AND USE IT CORRECTLY Medicines can help you stop smoking and decrease the urge to smoke. Combining medicine with the above behavioral methods and support can greatly increase your chances of successfully quitting smoking.  Nicotine replacement  therapy helps deliver nicotine to your body without the negative effects and risks of smoking. Nicotine replacement therapy includes nicotine gum, lozenges, inhalers, nasal sprays, and skin patches. Some may be available over-the-counter and others require a prescription.  Antidepressant medicine helps people abstain from smoking, but how this works is unknown. This medicine is available by prescription.  Nicotinic receptor partial agonist medicine simulates the effect of nicotine in your brain. This medicine is available by prescription. Ask your health care provider for advice about which medicines to use and how to use them based on your health history. Your health care provider will tell you what side effects to look out for if you choose to be on a medicine or therapy. Carefully read the information on the package. Do not use any other product containing nicotine while using a nicotine replacement product.  RELAPSE OR DIFFICULT SITUATIONS Most relapses occur within the first 3 months after quitting. Do not be discouraged if you start smoking again. Remember, most people try several times before finally quitting. You may have symptoms of withdrawal because your body is used to nicotine. You may crave cigarettes, be irritable, feel very hungry, cough often, get headaches, or have difficulty concentrating. The withdrawal symptoms are only temporary. They are strongest when you first quit, but they will go away within 10-14 days. To reduce the chances of relapse, try to:  Avoid drinking alcohol. Drinking lowers your chances of successfully quitting.  Reduce the amount of caffeine  you consume. Once you quit smoking, the amount of caffeine in your body increases and can give you symptoms, such as a rapid heartbeat, sweating, and anxiety.  Avoid smokers because they can make you want to smoke.  Do not let weight gain distract you. Many smokers will gain weight when they quit, usually less than 10 pounds. Eat a healthy diet and stay active. You can always lose the weight gained after you quit.  Find ways to improve your mood other than smoking. FOR MORE INFORMATION  www.smokefree.gov  Document Released: 05/16/2001 Document Revised: 10/06/2013 Document Reviewed: 08/31/2011 [facility] Patient Information 2015 [place], [place]. This information is not intended to replace advice given to you by your health care provider. Make sure you discuss any questions you have with your health care provider.

## 2014-03-11 NOTE — Progress Notes (Signed)
[facility] [place] HISTORY AND PHYSICAL -PAD  History of Present Illness Charlotte Henry is a 73 y.o. female patient of Dr. Scot Dock seen for a history of PVD without true claudication or rest pain and has a history of TIA's.  She returns today for ABI's.  Her knees and feet hurt when she walks, but not her calves or thighs.  She has arthritis pain in her hips and knees exacerbated with walking.  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  Patient had a mini stroke in 2011 and states she had an [place] of neck at that time, was admitted to [facility].  She denies post prandial gastric pain.  Patient denies non-healing wounds.  She has not had any peripheral vascular intervention.  Patient denies New Medical or Surgical History.  Is not walking much, no barriers to walking at present; she will be starting shuffleboard, softball throw, and miniature golf.  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  Pt Diabetic: No, is no longer diabetic since she lost 60 + pounds.  Pt smoker: smoker (1 ppd x 50 yrs, resumed smoking cigarettes since 4 close friends died)  Pt meds include:  Statin :Yes  Betablocker: No  ASA: Yes  Other anticoagulants/antiplatelets: no     Past Medical History  Diagnosis Date  . Hypertension   . ASCVD (arteriosclerotic cardiovascular disease)     Neg. stress nuc. 3/99; cath in 4/04,-80%LAD; 70% RCA, -> DESx2; residual 60% distal RCA; nl EF  . Hyperkalemia     potassium of 5.1-5.3 in 2011  . Diabetes mellitus     Treated with oral agents; HgbA1c of 7in 08/2008  . Gout   . GERD (gastroesophageal reflux disease)   . Diverticulosis   . Hyperlipidemia   . Chronic kidney disease (CKD), stage III (moderate)     Creat.= 1.78 in 11/2009  . PVD (peripheral vascular disease)      moderate to severely decreased ABI's in 8/08,  sig. aortic inflow disease; repeat ABI's in 2011 improved- mild disease on the left and moderate to severe disease on the right  . Cerebrovascular disease      mild; <50% ICA with moderate plaque bilaterally in 12/2009  . Asthmatic bronchitis   . Tobacco abuse     50 pack years continuing at 1/2 pack per day  . CHF (congestive heart failure)   . Arthritis   . Anemia   . Nervousness(799.21)   . Stroke 2012    Mini strokes- Temporary  blindness  . Myocardial infarction   . Coronary disease   . Arterial fibromuscular dysplasia   . Clostridium difficile colitis OCT 2014  . Carotid artery occlusion     Social History History  Substance Use Topics  . Smoking status: Current Every Day Smoker -- 0.50 packs/day for 50 years    Types: Cigarettes    Start date: 02/02/1966  . Smokeless tobacco: Never Used     Comment: 6 cig. a day  . Alcohol Use: 8.4 oz/week    14 Cans of beer per week    Family History Family History  Problem Relation Age of Onset  . Liver disease Mother 28  . Hypertension Mother   . Cerebral aneurysm Father   . Aneurysm Father 59  . Cancer Sister   . Liver disease Brother   .  Liver disease Brother   . Liver disease Brother   . Diabetes type II Brother   . Alcohol abuse Brother   . Cancer Sister     Past Surgical History  Procedure Laterality Date  . Cholecystectomy    . Carpel tunnel release      bilateral  . Total abdominal hysterectomy w/ bilateral salpingoophorectomy    . Colonoscopy  2009  . Abdominal hysterectomy      Allergies  Allergen Reactions  . Ace Inhibitors Hives, Shortness Of Breath and Swelling  . Aspirin Other (See Comments)    Blood in stool and nose bleed  . Codeine Swelling  . Pineapple Shortness Of Breath and Swelling  . Plasticized Base [Plastibase] Hives and Swelling  . Ultram [Tramadol] Swelling  . Adhesive [Tape] Swelling and Rash  . Naprosyn [Naproxen] Swelling  . Linzess [Linaclotide]     DIARRHEA, FECAL INCONTINENCE   . Nsaids   . Varenicline Tartrate Swelling    Current Outpatient Prescriptions  Medication Sig Dispense Refill  . ALPRAZolam (XANAX) 0.25 MG tablet Take 1 tablet (0.25 mg total) by mouth at bedtime as needed.  30 tablet  1  . aspirin 81 MG tablet Take 81 mg by mouth daily.        . calcium citrate-vitamin D (CITRACAL+D) 315-200 MG-UNIT per tablet Take 1 tablet by mouth daily.        Marland Kitchen EPINEPHrine 0.3 mg/0.3 mL IJ SOAJ injection Inject 0.3 mLs (0.3 mg total) into the muscle once.  1 Device  3  . ezetimibe (ZETIA) 10 MG tablet TAKE 1 TABLET BY MOUTH ONCE A DAY.  30 tablet  6  . lubiprostone (AMITIZA) 8 MCG capsule Take 1 capsule (8 mcg total) by mouth 2 (two) times daily with a meal.  60 capsule  3  . mirabegron ER (MYRBETRIQ) 25 MG TB24 tablet Take 25 mg by mouth daily.      Marland Kitchen NEXIUM 40 MG capsule TAKE 1 CAPSULE BY MOUTH ONCE DAILY FOR STOMACH.  30 capsule  6  . Pancrelipase, Lip-Prot-Amyl, (CREON PO) Take 3,600 Units by mouth. 2 caps po with meals and 1 with snacks      . pravastatin (PRAVACHOL) 40 MG tablet TAKE 1 TABLET BY MOUTH ONCE DAILY.  90 tablet  0  . Probiotic Product (PROBIOTIC DAILY PO) Take by mouth daily.      . valsartan (DIOVAN) 160 MG tablet Take 1 tablet once a day  30 tablet  3  . VOLTAREN 1 % GEL APPLY TO KNEE JOINT 2 TIMES A DAY.  100 g  2  . gabapentin (NEURONTIN) 300 MG capsule TAKE 2 CAPSULE BY MOUTH EVERY 8 HOURS FOR LEG PAIN.  180 capsule  3   No current facility-administered medications for this visit.    ROS: See HPI for pertinent positives and negatives.   Physical Examination  Filed Vitals:   03/11/14 1528 03/11/14 1531  BP: 137/67 113/71  Pulse: 50 51  Resp:  14  Height:  5\' 7"  (1.702 m)  Weight:  164 lb (74.39 kg)  SpO2:  100%   Body mass index is 25.68 kg/(m^2).  General: A&O x 3, WDWN.  Gait: slow, deliberate, mildly antalgic  Eyes: PERRLA Pulmonary: CTAB, without wheezes , rales or rhonchi.  Cardiac: regular Rythm , without detected  murmur.   Carotid Bruits  Left  Right    Negative  Negative    Aorta is not palpable.  Bilateral radial pulses are palpable: 1+ left,  2+ right.   VASCULAR EXAM:  Extremities without ischemic changes  without Gangrene; without open wounds.Trace bilateral pretibial pitting edema.   LE Pulses  LEFT  RIGHT   FEMORAL  not palpable  palpable   POPLITEAL  not palpable  not palpable   POSTERIOR TIBIAL  not palpable  not palpable   DORSALIS PEDIS  ANTERIOR TIBIAL  not palpable  palpable    Abdomen: soft, NT, no masses palpated.  Skin: no rashes, no ulcers noted.  Musculoskeletal: no muscle wasting or atrophy  Neurologic: A&O X 3; Appropriate Affect ; SENSATION: normal; MOTOR FUNCTION: moving all extremities equally, M/S 4/5 throughout. Speech is fluent/normal.  CN 2-12 intact   Non-Invasive Vascular Imaging: DATE: 03/11/2014 ABI: RIGHT 0.49, Waveforms: monophasic, TBI: 0.51;  LEFT 0.61, Waveforms: monophasic, TBI: 0.37 Previous (09/03/13) ABI's: Right: 0.46, Left: 0.65   ASSESSMENT: SADIYA DURAND is a 73 y.o. female who returns for evaluation of PAD, also has a history os a TIA in 2011. Carotid Duplex in April, 2015 demonstrated minimal bilateral ICA stenosis. She has no claudication symptoms with walking, no tissue loss. Unfortunately she continues to smoke, but has lost enough weight that she no longer has Type 2 DM. She takes a daily statin and ASA.  PLAN:  Patient was again counseled re smoking cessation. I discussed in depth with the patient the nature of atherosclerosis, and emphasized the importance of maximal medical management including strict control of blood pressure, blood glucose, and lipid levels, obtaining regular exercise, and cessation of smoking.  The patient is aware that without maximal medical management the underlying atherosclerotic disease process will progress, limiting the benefit of any interventions.  Based on the patient's vascular studies and  examination, pt will return to clinic in 6 months for ABI's and carotid Duplex, follow up with Dr. Scot Dock.  The patient was given information about PAD including signs, symptoms, treatment, what symptoms should prompt the patient to seek immediate medical care, and risk reduction measures to take.  Clemon Chambers, RN, MSN, FNP-C [facility] Phone: [phone]  Clinic MD: Scot Dock  03/11/2014 3:41 PM

## 2014-03-12 ENCOUNTER — Ambulatory Visit: Payer: Commercial Managed Care - HMO

## 2014-03-12 NOTE — Addendum Note (Signed)
Addended by: Dorthula Rue L on: 03/12/2014 10:39 AM   Modules accepted: Orders

## 2014-03-13 ENCOUNTER — Encounter: Payer: Self-pay | Admitting: Gastroenterology

## 2014-03-18 ENCOUNTER — Encounter: Payer: Self-pay | Admitting: Gastroenterology

## 2014-03-20 ENCOUNTER — Other Ambulatory Visit: Payer: Self-pay | Admitting: Family Medicine

## 2014-03-20 DIAGNOSIS — Z0000 Encounter for general adult medical examination without abnormal findings: Secondary | ICD-10-CM

## 2014-03-27 ENCOUNTER — Encounter: Payer: Self-pay | Admitting: *Deleted

## 2014-03-27 ENCOUNTER — Telehealth: Payer: Self-pay | Admitting: *Deleted

## 2014-03-27 ENCOUNTER — Other Ambulatory Visit: Payer: Self-pay | Admitting: Adult Health

## 2014-03-27 MED ORDER — PRAVASTATIN SODIUM 40 MG PO TABS: ORAL_TABLET | ORAL | Status: DC

## 2014-03-27 NOTE — Telephone Encounter (Signed)
Refill complete 

## 2014-03-27 NOTE — Telephone Encounter (Signed)
[facility] FAXED FOR PRAVASTATIN 40 MG #90

## 2014-04-29 ENCOUNTER — Ambulatory Visit (HOSPITAL_COMMUNITY)
Admission: RE | Admit: 2014-04-29 | Discharge: 2014-04-29 | Disposition: A | Discharge status: Home / Self Care | Payer: Medicare HMO | Source: Ambulatory Visit | Attending: Family Medicine | Admitting: Family Medicine

## 2014-04-29 DIAGNOSIS — Z0000 Encounter for general adult medical examination without abnormal findings: Secondary | ICD-10-CM | POA: Diagnosis not present

## 2014-05-04 ENCOUNTER — Encounter: Payer: Self-pay | Admitting: Family Medicine

## 2014-05-04 ENCOUNTER — Ambulatory Visit (INDEPENDENT_AMBULATORY_CARE_PROVIDER_SITE_OTHER): Payer: Commercial Managed Care - HMO | Admitting: Family Medicine

## 2014-05-04 VITALS — BP 128/74 | HR 76 | Temp 98.3°F | Resp 14 | Ht 65.0 in | Wt 159.0 lb

## 2014-05-04 DIAGNOSIS — J4521 Mild intermittent asthma with (acute) exacerbation: Secondary | ICD-10-CM

## 2014-05-04 MED ORDER — BENZONATATE 100 MG PO CAPS: 100.0000 mg | ORAL_CAPSULE | Freq: Three times a day (TID) | ORAL | Status: DC

## 2014-05-04 MED ORDER — LEVOFLOXACIN 500 MG PO TABS: 500.0000 mg | ORAL_TABLET | Freq: Every day | ORAL | Status: DC

## 2014-05-04 MED ORDER — ALBUTEROL SULFATE HFA 108 (90 BASE) MCG/ACT IN AERS: 2.0000 | INHALATION_SPRAY | RESPIRATORY_TRACT | Status: DC | PRN

## 2014-05-04 NOTE — Patient Instructions (Signed)
Continue current medications Take the antibiotics as prescribed Cough pill F/U Friday

## 2014-05-04 NOTE — Assessment & Plan Note (Signed)
Will treat for acute exacerbation , I think this is separate from the possible allergic reaction she had to the clindamycin. I started her on Levaquin 1 tablet once a day. Also give her Ladona Ridgel and she can use Tessalon over-the-counter. I will recheck her this week if she does not improve we will obtain a chest x-ray. Her oxygen saturations are normal. The clindamycin has been discontinued. Also gave her some Cepacol lozenges from the clinic

## 2014-05-04 NOTE — Progress Notes (Signed)
Patient ID: Charlotte Henry, female   DOB: 14-Mar-1941, 73 y.o.   MRN: [id number]   Subjective:    Patient ID: Charlotte Henry, female    DOB: 06/13/1940, 73 y.o.   MRN: [id number]  Patient presents for Allergic Reaction and Illness  patient here due to medication allergy and now with illness. She was given clindamycin 1 week ago by her dentist after she had a tooth removed. She took the clindamycin for 2 days but began having a burning sensation in the back of her throat down to her abdomen. She does not ligate the emergency room because there was a long wait and she did not call the after hours line. She took 3 tablets of Nexium and discontinue the medication. The burning sensation resolved after 2 days. On Thursday she began to have cough with production which is worsened with some wheezing. She has not had any shortness of breath. She is not sure if she had any fever. She is a smoker. Denies nausea vomiting abdominal pain.   Review Of Systems:  GEN- + fatigue, fever, weight loss,weakness, recent illness HEENT- denies eye drainage, change in vision, nasal discharge, CVS- denies chest pain, palpitations RESP- denies SOB,+ cough, +wheeze ABD- denies N/V, change in stools, abd pain GU- denies dysuria, hematuria, dribbling, incontinence MSK- denies joint pain, muscle aches, injury Neuro- denies headache, dizziness, syncope, seizure activity       Objective:    BP 128/74 mmHg  Pulse 76  Temp(Src) 98.3 F (36.8 C) (Oral)  Resp 14  Ht 5\' 5"  (1.651 m)  Wt 159 lb (72.122 kg)  BMI 26.46 kg/m2  SpO2 98% GEN- NAD, alert and oriented x3 HEENT- PERRL, EOMI, non injected sclera, pink conjunctiva, MMM, oropharynx injected, no maxillary sinus tenderness, mild erythema of right post gumline, TM clear no effusion Neck- Supple, shotty anterior LAD CVS- RRR, no murmur RESP-Course BS bilat, mild rhonchi clears with cough, scattered wheeze ABD-NABS,soft,NT,ND EXT- No edema Pulses- Radial  2+        Assessment & Plan:      Problem List Items Addressed This Visit    Asthmatic bronchitis - Primary   Relevant Medications      ALBUTEROL SULFATE HFA 108 (90 BASE) MCG/ACT IN AERS      Note: This dictation was prepared with Dragon dictation along with smaller phrase technology. Any transcriptional errors that result from this process are unintentional.

## 2014-05-06 ENCOUNTER — Ambulatory Visit (INDEPENDENT_AMBULATORY_CARE_PROVIDER_SITE_OTHER): Payer: Commercial Managed Care - HMO | Admitting: Gastroenterology

## 2014-05-06 ENCOUNTER — Encounter: Payer: Self-pay | Admitting: Gastroenterology

## 2014-05-06 VITALS — BP 126/69 | HR 64 | Temp 97.7°F | Ht 67.0 in | Wt 158.2 lb

## 2014-05-06 DIAGNOSIS — K589 Irritable bowel syndrome without diarrhea: Secondary | ICD-10-CM

## 2014-05-06 DIAGNOSIS — Z1211 Encounter for screening for malignant neoplasm of colon: Secondary | ICD-10-CM

## 2014-05-06 MED ORDER — PANCRELIPASE (LIP-PROT-AMYL) 12000-38000 UNITS PO CPEP: ORAL_CAPSULE | ORAL | Status: DC

## 2014-05-06 NOTE — Assessment & Plan Note (Signed)
SX EXACERBATED BY PANCREATIC INSUFFICIENCY. PT IMPROVED WITH CREON 36K BUT GOT CONSTIPATED.  TRY CREON 12 WITH MEALS AND SNACKS.  CLL AFTER ONE MO IF SX NOT IMPROVED WILL TITRATE TO 24K PER DOSE.  FOLLOW UP IN 2 MOS.

## 2014-05-06 NOTE — Progress Notes (Signed)
Subjective:    Patient ID: Charlotte Henry, female    DOB: Dec 22, 1940, 73 y.o.   MRN: [id number]  [name], MD  HPI Almost caught the PNA AFTER TAKING CLINDAMYCIN. CALLED DENTIST. STOPPED TAKING CREON DUE TO CONSTIPATION. CALLED BUT NO ANSWER. BMs: 1-2X/WEEK. AMITIZA WRITTEN FOR AMITIZA 8 MCG 3 BID. BOWELS: TOOK MIRALAX. WEIGHT DOWN FROM 170 LBS TO 158 LBS. HAVING LOOSE STOOLS NOW-5 YESTERDAY AND TODAY 3 ALREADY. FRI HAD FEVER AND CHILLS. WS HAVING SOB BUT IT'S BETTER. NAUSEA AND VOMITING-SINCE BEING ON CLINDAMYCIN. STOOLS LOOK LIKE SLUDGE. LAST NL FORMED STOOL: BEFORE THANKSGIVING. NO ABDOMINAL PAIN NOW. HAD THROAT PAIN WITH SX. FIRST TIME SHE DROVE SINCE ???. [place].   PT DENIES HEMATOCHEZIA, HEMATEMESIS, melena, diarrhea, CHEST PAIN, problems swallowing, OR heartburn or indigestion.  Past Medical History  Diagnosis Date  . Hypertension   . ASCVD (arteriosclerotic cardiovascular disease)     Neg. stress nuc. 3/99; cath in 4/04,-80%LAD; 70% RCA, -> DESx2; residual 60% distal RCA; nl EF  . Hyperkalemia     potassium of 5.1-5.3 in 2011  . Diabetes mellitus     Treated with oral agents; HgbA1c of 7in 08/2008  . Gout   . GERD (gastroesophageal reflux disease)   . Diverticulosis   . Hyperlipidemia   . Chronic kidney disease (CKD), stage III (moderate)     Creat.= 1.78 in 11/2009  . PVD (peripheral vascular disease)      moderate to severely decreased ABI's in 8/08, sig. aortic inflow disease; repeat ABI's in 2011 improved- mild disease on the left and moderate to severe disease on the right  . Cerebrovascular disease      mild; <50% ICA with moderate plaque bilaterally in 12/2009  . Asthmatic bronchitis   . Tobacco abuse     50 pack years continuing at 1/2 pack per day  . CHF (congestive heart failure)   . Arthritis   . Anemia   . Nervousness(799.21)   . Stroke 2012    Mini strokes- Temporary  blindness  . Myocardial infarction   . Coronary disease   .  Arterial fibromuscular dysplasia   . Clostridium difficile colitis OCT 2014  . Carotid artery occlusion    Past Surgical History  Procedure Laterality Date  . Cholecystectomy    . Carpel tunnel release      bilateral  . Total abdominal hysterectomy w/ bilateral salpingoophorectomy    . Colonoscopy  2009  . Abdominal hysterectomy    ] Allergies  Allergen Reactions  . Ace Inhibitors Hives, Shortness Of Breath and Swelling  . Aspirin Other (See Comments)    Blood in stool and nose bleed  . Codeine Swelling  . Pineapple Shortness Of Breath and Swelling  . Plasticized Base [Plastibase] Hives and Swelling  . Ultram [Tramadol] Swelling  . Adhesive [Tape] Swelling and Rash  . Naprosyn [Naproxen] Swelling  . Clindamycin/Lincomycin Other (See Comments)    Burning sensation throat, abdomen  . Linzess [Linaclotide]     DIARRHEA, FECAL INCONTINENCE  . Nsaids   . Penicillins   . Varenicline Tartrate Swelling   Current Outpatient Prescriptions  Medication Sig Dispense Refill  . albuterol (PROVENTIL HFA;VENTOLIN HFA) 108 (90 BASE) MCG/ACT inhaler Inhale 2 puffs into the lungs every 4 (four) hours as needed for wheezing or shortness of breath.    . ALPRAZolam (XANAX) 0.25 MG tablet Take 1 tablet (0.25 mg total) by mouth at bedtime as needed.    Marland Kitchen aspirin 81 MG  tablet Take 81 mg by mouth daily.      . benzonatate (TESSALON) 100 MG capsule Take 1 capsule (100 mg total) by mouth 3 (three) times daily.    . calcium citrate-vitamin D (CITRACAL+D) 315-200 MG-UNIT per tablet Take 1 tablet by mouth daily.      . Cranberry 250 MG CAPS Take 250 mg by mouth daily.    Marland Kitchen ezetimibe (ZETIA) 10 MG tablet TAKE 1 TABLET BY MOUTH ONCE A DAY.    Marland Kitchen levofloxacin (LEVAQUIN) 500 MG tablet Take 1 tablet (500 mg total) by mouth daily.    Marland Kitchen NEXIUM 40 MG capsule TAKE 1 CAPSULE BY MOUTH ONCE DAILY FOR STOMACH.    . pravastatin (PRAVACHOL) 40 MG tablet TAKE 1 TABLET BY MOUTH ONCE DAILY.    . Probiotic Product  (PROBIOTIC DAILY PO) Take by mouth daily.    . valsartan (DIOVAN) 160 MG tablet Take 1 tablet once a day    .      Marland Kitchen EPINEPHrine 0.3 mg/0.3 mL IJ SOAJ injection Inject 0.3 mLs (0.3 mg total) into the muscle once. (Patient not taking: Reported on 05/06/2014)    . gabapentin (NEURONTIN) 300 MG capsule TAKE 2 CAPSULE BY MOUTH EVERY 8 HOURS FOR LEG PAIN. (Patient not taking: Reported on 05/06/2014)    .      . mirabegron ER (MYRBETRIQ) 25 MG TB24 tablet Take 25 mg by mouth daily.    .      . VOLTAREN 1 % GEL APPLY TO KNEE JOINT 2 TIMES A DAY. (Patient not taking: Reported on 05/06/2014)       Review of Systems     Objective:   Physical Exam  Constitutional: She is oriented to person, place, and time. She appears well-developed and well-nourished. No distress.  HENT:  Head: Normocephalic and atraumatic.  Mouth/Throat: Oropharynx is clear and moist. No oropharyngeal exudate.  Eyes: Pupils are equal, round, and reactive to light. No scleral icterus.  Neck: Normal range of motion. Neck supple.  Cardiovascular: Normal rate, regular rhythm and normal heart sounds.   Pulmonary/Chest: Effort normal and breath sounds normal. No respiratory distress.  Abdominal: Soft. Bowel sounds are normal. She exhibits no distension. There is no tenderness.  Musculoskeletal: She exhibits no edema.  Lymphadenopathy:    She has no cervical adenopathy.  Neurological: She is alert and oriented to person, place, and time.  NO  NEW FOCAL DEFICITS   Psychiatric:  FLAT AFFECT, NL MOOD  Vitals reviewed.         Assessment & Plan:

## 2014-05-06 NOTE — Assessment & Plan Note (Signed)
Pt declines TCS at this time.

## 2014-05-06 NOTE — Progress Notes (Signed)
ON RECAL LIST

## 2014-05-06 NOTE — Progress Notes (Signed)
cc'ed to pcp °

## 2014-05-06 NOTE — Patient Instructions (Addendum)
Change to CREON 12 WITH MEALS AND SNACKS.  PLEASE CALL if you have trouble with your bowels after one month.  FOLLOW UP IN 2 MOS.

## 2014-05-08 ENCOUNTER — Ambulatory Visit (INDEPENDENT_AMBULATORY_CARE_PROVIDER_SITE_OTHER): Payer: Commercial Managed Care - HMO | Admitting: Family Medicine

## 2014-05-08 ENCOUNTER — Encounter: Payer: Self-pay | Admitting: Family Medicine

## 2014-05-08 VITALS — BP 122/70 | HR 60 | Temp 98.5°F | Resp 14 | Ht 65.0 in | Wt 159.0 lb

## 2014-05-08 DIAGNOSIS — J4521 Mild intermittent asthma with (acute) exacerbation: Secondary | ICD-10-CM

## 2014-05-08 NOTE — Progress Notes (Signed)
   Subjective:    Patient ID: Charlotte Henry, female    DOB: November 16, 1940, 73 y.o.   MRN: [id number]  Patient presents for F/U  Patient here to follow-up asthma bronchitis flare. She was seen on the 30th at that time I started her on antibiotics as well as albuterol inhaler and Tessalon Perles. She states she is feeling much better she's not had any fever and her fatigue is improving. She still has cough but the coughing perles as well as the inhaler helped her significantly. She is not having new concerns today.   Review Of Systems:  GEN- denies fatigue, fever, weight loss,weakness, recent illness HEENT- denies eye drainage, change in vision, nasal discharge, CVS- denies chest pain, palpitations RESP- denies SOB,+ cough, wheeze Neuro- denies headache, dizziness, syncope, seizure activity       Objective:    BP 122/70 mmHg  Pulse 60  Temp(Src) 98.5 F (36.9 C) (Oral)  Resp 14  Ht 5\' 5"  (1.651 m)  Wt 159 lb (72.122 kg)  BMI 26.46 kg/m2  SpO2 94% GEN- NAD, alert and oriented x3 HEENT- PERRL, EOMI, non injected sclera, pink conjunctiva, MMM, oropharynx non injected Neck- Supple, shotty anterior LAD CVS- RRR, no murmur RESP-Course BS bilat,no wheeze, normal WOB, good air movement EXT- No edema Pulses- Radial 2+        Assessment & Plan:      Problem List Items Addressed This Visit    None      Note: This dictation was prepared with Dragon dictation along with smaller phrase technology. Any transcriptional errors that result from this process are unintentional.

## 2014-05-08 NOTE — Patient Instructions (Signed)
Continue current medications Complete antibiotics and use inhaler  F/U Jan 27th ( schedule with Jaci Standard)

## 2014-05-08 NOTE — Assessment & Plan Note (Signed)
Much improved, she is higher risk for CAP, will have her complete antibiotics as improving, sats look good Continue albuterol, hold on prednisone at this time

## 2014-05-20 ENCOUNTER — Other Ambulatory Visit: Payer: Self-pay | Admitting: Family Medicine

## 2014-05-20 NOTE — Telephone Encounter (Signed)
Refill appropriate and filled per protocol. 

## 2014-05-25 ENCOUNTER — Encounter: Payer: Self-pay | Admitting: Gastroenterology

## 2014-06-15 ENCOUNTER — Telehealth: Payer: Self-pay | Admitting: Family Medicine

## 2014-06-15 NOTE — Telephone Encounter (Signed)
Call placed to patient.   Reports that she has had productive cough x1 week with clear mucus. Reports that her cough gets worse at night.   Advised to use Mucinex and drink plenty of water to thin mucus.   Requested to have MD advise.

## 2014-06-15 NOTE — Telephone Encounter (Signed)
Pt is rtn your call [phone]

## 2014-06-15 NOTE — Telephone Encounter (Signed)
Patient would like a call from you and would not go into details with me about what the issue was about please call her at  [phone]

## 2014-06-15 NOTE — Telephone Encounter (Signed)
Call placed to patient and patient made aware.  

## 2014-06-15 NOTE — Telephone Encounter (Signed)
Call placed to patient. LMTRC.  

## 2014-06-15 NOTE — Telephone Encounter (Signed)
Use mucinex dm, Use her albuterol inhaler before bedtime, schedule visit is not getting better or if wheezing

## 2014-06-29 ENCOUNTER — Encounter: Payer: Self-pay | Admitting: Family Medicine

## 2014-07-01 ENCOUNTER — Ambulatory Visit (INDEPENDENT_AMBULATORY_CARE_PROVIDER_SITE_OTHER): Payer: Commercial Managed Care - HMO | Admitting: Family Medicine

## 2014-07-01 ENCOUNTER — Encounter: Payer: Self-pay | Admitting: Family Medicine

## 2014-07-01 VITALS — BP 128/68 | HR 72 | Temp 98.4°F | Resp 16 | Ht 65.0 in | Wt 157.0 lb

## 2014-07-01 DIAGNOSIS — K219 Gastro-esophageal reflux disease without esophagitis: Secondary | ICD-10-CM

## 2014-07-01 DIAGNOSIS — N183 Chronic kidney disease, stage 3 unspecified: Secondary | ICD-10-CM

## 2014-07-01 DIAGNOSIS — J4521 Mild intermittent asthma with (acute) exacerbation: Secondary | ICD-10-CM

## 2014-07-01 DIAGNOSIS — E1159 Type 2 diabetes mellitus with other circulatory complications: Secondary | ICD-10-CM

## 2014-07-01 DIAGNOSIS — I10 Essential (primary) hypertension: Secondary | ICD-10-CM | POA: Diagnosis not present

## 2014-07-01 DIAGNOSIS — E785 Hyperlipidemia, unspecified: Secondary | ICD-10-CM | POA: Diagnosis not present

## 2014-07-01 LAB — COMPLETE METABOLIC PANEL WITH GFR
ALT: 12 U/L (ref 0–35)
AST: 19 U/L (ref 0–37)
Albumin: 3.6 g/dL (ref 3.5–5.2)
Alkaline Phosphatase: 46 U/L (ref 39–117)
BUN: 20 mg/dL (ref 6–23)
CALCIUM: 9.3 mg/dL (ref 8.4–10.5)
CHLORIDE: 106 meq/L (ref 96–112)
CO2: 27 mEq/L (ref 19–32)
CREATININE: 0.98 mg/dL (ref 0.50–1.10)
GFR, EST AFRICAN AMERICAN: 66 mL/min
GFR, Est Non African American: 57 mL/min — ABNORMAL LOW
GLUCOSE: 93 mg/dL (ref 70–99)
POTASSIUM: 4.5 meq/L (ref 3.5–5.3)
Sodium: 140 mEq/L (ref 135–145)
Total Bilirubin: 0.4 mg/dL (ref 0.2–1.2)
Total Protein: 6.3 g/dL (ref 6.0–8.3)

## 2014-07-01 LAB — CBC WITH DIFFERENTIAL/PLATELET
BASOS ABS: 0.1 10*3/uL (ref 0.0–0.1)
Basophils Relative: 1 % (ref 0–1)
EOS ABS: 0.2 10*3/uL (ref 0.0–0.7)
Eosinophils Relative: 3 % (ref 0–5)
HCT: 35.7 % — ABNORMAL LOW (ref 36.0–46.0)
HEMOGLOBIN: 11.6 g/dL — AB (ref 12.0–15.0)
LYMPHS PCT: 40 % (ref 12–46)
Lymphs Abs: 3 10*3/uL (ref 0.7–4.0)
MCH: 29.5 pg (ref 26.0–34.0)
MCHC: 32.5 g/dL (ref 30.0–36.0)
MCV: 90.8 fL (ref 78.0–100.0)
MPV: 9.7 fL (ref 8.6–12.4)
Monocytes Absolute: 0.6 10*3/uL (ref 0.1–1.0)
Monocytes Relative: 8 % (ref 3–12)
Neutro Abs: 3.6 10*3/uL (ref 1.7–7.7)
Neutrophils Relative %: 48 % (ref 43–77)
PLATELETS: 210 10*3/uL (ref 150–400)
RBC: 3.93 MIL/uL (ref 3.87–5.11)
RDW: 14.3 % (ref 11.5–15.5)
WBC: 7.4 10*3/uL (ref 4.0–10.5)

## 2014-07-01 LAB — LIPID PANEL
CHOL/HDL RATIO: 2.6 ratio
CHOLESTEROL: 115 mg/dL (ref 0–200)
HDL: 45 mg/dL (ref >39)
LDL CALC: 53 mg/dL (ref 0–99)
TRIGLYCERIDES: 86 mg/dL (ref <150)
VLDL: 17 mg/dL (ref 0–40)

## 2014-07-01 LAB — HEMOGLOBIN A1C
Hgb A1c MFr Bld: 6.2 % — ABNORMAL HIGH (ref <5.7)
MEAN PLASMA GLUCOSE: 131 mg/dL — AB (ref <117)

## 2014-07-01 MED ORDER — METHYLPREDNISOLONE ACETATE 40 MG/ML IJ SUSP
40.0000 mg | Freq: Once | INTRAMUSCULAR | Status: AC
  Administered 2014-07-01: 40 mg via INTRAMUSCULAR

## 2014-07-01 MED ORDER — MIRABEGRON ER 25 MG PO TB24: 25.0000 mg | ORAL_TABLET | Freq: Every day | ORAL | Status: DC

## 2014-07-01 MED ORDER — PREDNISONE 20 MG PO TABS: 40.0000 mg | ORAL_TABLET | Freq: Every day | ORAL | Status: DC

## 2014-07-01 MED ORDER — BENZONATATE 100 MG PO CAPS: 100.0000 mg | ORAL_CAPSULE | Freq: Three times a day (TID) | ORAL | Status: DC

## 2014-07-01 NOTE — Assessment & Plan Note (Signed)
I given her a shot of Depo-Medrol in the office as well as prednisone burst for the wheezing she will continue her albuterol we will get a chest x-ray I did treat her with some antibiotics a few weeks ago we will see if there is anything else in the chest and treat as needed Tessalon Perles for cough

## 2014-07-01 NOTE — Assessment & Plan Note (Signed)
Recheck A1c diet-controlled

## 2014-07-01 NOTE — Assessment & Plan Note (Signed)
Blood pressure is well controlled and changed her medication

## 2014-07-01 NOTE — Assessment & Plan Note (Signed)
Recheck lipids on statin and zetia

## 2014-07-01 NOTE — Assessment & Plan Note (Signed)
Patient does not recall being on any other proton pump inhibitor besides Nexium she states that she spoke with her insurance already and has the alternatives listed at home she will give me a call back I did give her some samples of the Nexium from the office to tie her over until we can find one that is covered

## 2014-07-01 NOTE — Patient Instructions (Addendum)
Call me about the alternative for nexium  Take the bladder pill at night Get chest xray Start prednisone pill tomorrow  Shot of prednisone given today F/U 3 months

## 2014-07-01 NOTE — Progress Notes (Signed)
Patient ID: Charlotte Henry, female   DOB: [birth year], 74 y.o.   MRN: [id number]   Subjective:    Patient ID: Charlotte Henry, female    DOB: 1940-08-25, 74 y.o.   MRN: [id number]  Patient presents for F/U; Medication Management; and Cough  patient here to follow-up chronic medications. She continues to have cough she has asthma with bronchitis sheet her with antibiotics about a month ago she states that she still has the same cough this and now has some wheezing the cough is productive. She's not had any fever and does not feel short of breath. She has been using her inhaler little bit more this past week.  Diabetes mellitus her last A1c was 6.7% she is taking her medications as prescribed she is not on active treatment for her diabetes she is due for fasting labs.  Overactive bladder she has to meter better on her list however she has not had this recently would like to restart.  Acid reflux she is followed by gastroenterology secondary to irritable bowel syndrome and is on Creon     Review Of Systems:  GEN- denies fatigue, fever, weight loss,weakness, recent illness HEENT- denies eye drainage, change in vision, nasal discharge, CVS- denies chest pain, palpitations RESP- denies SOB, +cough, +wheeze ABD- denies N/V, change in stools, abd pain GU- denies dysuria, hematuria, dribbling, incontinence MSK- denies joint pain, muscle aches, injury Neuro- denies headache, dizziness, syncope, seizure activity       Objective:    BP 128/68 mmHg  Pulse 72  Temp(Src) 98.4 F (36.9 C) (Oral)  Resp 16  Ht 5\' 5"  (1.651 m)  Wt 157 lb (71.215 kg)  BMI 26.13 kg/m2 GEN- NAD, alert and oriented x3 HEENT- PERRL, EOMI, non injected sclera, pink conjunctiva, MMM, oropharynx non injected Neck- Supple, shotty anterior LAD CVS- RRR, no murmur RESP-Course BS bilat, few rales right side no wheeze, normal WOB, good air movement EXT- No edema Pulses- Radial 2+        Assessment & Plan:       Problem List Items Addressed This Visit    None      Note: This dictation was prepared with Dragon dictation along with smaller phrase technology. Any transcriptional errors that result from this process are unintentional.

## 2014-07-02 ENCOUNTER — Other Ambulatory Visit: Payer: Self-pay | Admitting: *Deleted

## 2014-07-02 ENCOUNTER — Ambulatory Visit (HOSPITAL_COMMUNITY)
Admission: RE | Admit: 2014-07-02 | Discharge: 2014-07-02 | Disposition: A | Discharge status: Home / Self Care | Payer: Commercial Managed Care - HMO | Source: Ambulatory Visit | Attending: Family Medicine | Admitting: Family Medicine

## 2014-07-02 DIAGNOSIS — J449 Chronic obstructive pulmonary disease, unspecified: Secondary | ICD-10-CM | POA: Diagnosis not present

## 2014-07-02 DIAGNOSIS — R0602 Shortness of breath: Secondary | ICD-10-CM | POA: Diagnosis not present

## 2014-07-02 DIAGNOSIS — E119 Type 2 diabetes mellitus without complications: Secondary | ICD-10-CM | POA: Insufficient documentation

## 2014-07-02 DIAGNOSIS — Z720 Tobacco use: Secondary | ICD-10-CM | POA: Diagnosis not present

## 2014-07-02 DIAGNOSIS — R05 Cough: Secondary | ICD-10-CM | POA: Insufficient documentation

## 2014-07-02 DIAGNOSIS — J4521 Mild intermittent asthma with (acute) exacerbation: Secondary | ICD-10-CM

## 2014-07-02 MED ORDER — ESOMEPRAZOLE MAGNESIUM 40 MG PO CPDR: 40.0000 mg | DELAYED_RELEASE_CAPSULE | Freq: Every day | ORAL | Status: DC

## 2014-07-02 NOTE — Telephone Encounter (Signed)
Received fax requesting refill on esomeprazole.   Refill appropriate and filled per protocol.

## 2014-07-08 ENCOUNTER — Ambulatory Visit: Payer: Commercial Managed Care - HMO | Admitting: Gastroenterology

## 2014-07-15 ENCOUNTER — Encounter: Payer: Self-pay | Admitting: Family Medicine

## 2014-07-15 ENCOUNTER — Ambulatory Visit (INDEPENDENT_AMBULATORY_CARE_PROVIDER_SITE_OTHER): Payer: Commercial Managed Care - HMO | Admitting: Family Medicine

## 2014-07-15 VITALS — BP 130/76 | HR 68 | Temp 97.8°F | Resp 14 | Ht 65.0 in | Wt 156.0 lb

## 2014-07-15 DIAGNOSIS — S46011A Strain of muscle(s) and tendon(s) of the rotator cuff of right shoulder, initial encounter: Secondary | ICD-10-CM | POA: Diagnosis not present

## 2014-07-15 DIAGNOSIS — R300 Dysuria: Secondary | ICD-10-CM

## 2014-07-15 LAB — URINALYSIS, ROUTINE W REFLEX MICROSCOPIC
Bilirubin Urine: NEGATIVE
Glucose, UA: NEGATIVE mg/dL
Hgb urine dipstick: NEGATIVE
KETONES UR: NEGATIVE mg/dL
Leukocytes, UA: NEGATIVE
NITRITE: NEGATIVE
PH: 7 (ref 5.0–8.0)
Protein, ur: NEGATIVE mg/dL
SPECIFIC GRAVITY, URINE: 1.015 (ref 1.005–1.030)
Urobilinogen, UA: 1 mg/dL (ref 0.0–1.0)

## 2014-07-15 NOTE — Progress Notes (Signed)
Patient ID: Charlotte Henry, female   DOB: [birth year], 74 y.o.   MRN: [id number]   Subjective:    Patient ID: Charlotte Henry, female    DOB: 11/09/1940, 74 y.o.   MRN: [id number]  Patient presents for R Arm Pain and Frequent Urination  patient here with right arm pain for the past 3 days. On Monday she was at the grocery store and reached up to pick something up off of the shelf and felt a pain in her shoulder. Since then she has had pain in the upper arm that goes up to the top of her shoulder. Initially she could not lift her arm up without assistance. But it has improved each day. She is able to move in the lateral plane without any difficulties. She has not taken any medication. No swelling or bruising noted to the arm.  She also complains of some urinary frequency the past few days. She is not having significant burning with urination but some pressure. Denies any blood in the urine. She is taking the Myrebetriq just prescribed for the past 10 days but has not noticed a lot of difference.     Review Of Systems:  GEN- denies fatigue, fever, weight loss,weakness, recent illness HEENT- denies eye drainage, change in vision, nasal discharge, CVS- denies chest pain, palpitations RESP- denies SOB, cough, wheeze ABD- denies N/V, change in stools, abd pain GU- + dysuria, hematuria, dribbling, incontinence MSK- + joint pain, muscle aches, injury Neuro- denies headache, dizziness, syncope, seizure activity       Objective:    BP 130/76 mmHg  Pulse 68  Temp(Src) 97.8 F (36.6 C) (Oral)  Resp 14  Ht 5\' 5"  (1.651 m)  Wt 156 lb (70.761 kg)  BMI 25.96 kg/m2 GEN- NAD, alert and oriented x3 Neck- fair ROM CVS- RRR, no murmur RESP-occasional wheeze, otherwise clear ABD-NABS,soft,NT,ND, no CVA tenderness MSK- Bilat UE normal inspection, pain with empty can right side, pain with flexion of shoulder, can not get past shoulder level without pain,, pain with extension, unable to scratch back,  biceps in tact,unable to get good impingment signs due to ROM Pulses- Radial 2+        Assessment & Plan:      Problem List Items Addressed This Visit    None    Visit Diagnoses    Dysuria    -  Primary    No true dysuria, more frequency, send for culture, no sign of infection today, continue myrebetriq    Relevant Orders    Urinalysis, Routine w reflex microscopic (Completed)    Urine culture    Rotator cuff strain, right, initial encounter        Concern for strain possible tear, unable to take NSAIDS, take tylenol BID, ICE shoulder, ROM exercises, if not better ortho referral       Note: This dictation was prepared with Dragon dictation along with smaller phrase technology. Any transcriptional errors that result from this process are unintentional.

## 2014-07-15 NOTE — Patient Instructions (Signed)
Use the tylenol twice a day  We will call if any urine infection Continue all other medication If should not better 2 weeks F/U as previous

## 2014-07-16 LAB — URINE CULTURE
COLONY COUNT: NO GROWTH
ORGANISM ID, BACTERIA: NO GROWTH

## 2014-07-23 ENCOUNTER — Telehealth: Payer: Self-pay | Admitting: Family Medicine

## 2014-07-23 NOTE — Telephone Encounter (Signed)
Patient would like a call back, would not go into detail with me what the call was regarding  [phone]

## 2014-07-23 NOTE — Telephone Encounter (Signed)
Call placed to patient. LMTRC.  

## 2014-07-24 NOTE — Telephone Encounter (Signed)
Multiple calls placed to patient with no answer and no return call.   Message to be closed.  

## 2014-08-06 ENCOUNTER — Ambulatory Visit (INDEPENDENT_AMBULATORY_CARE_PROVIDER_SITE_OTHER): Payer: Commercial Managed Care - HMO | Admitting: Gastroenterology

## 2014-08-06 ENCOUNTER — Encounter: Payer: Self-pay | Admitting: Gastroenterology

## 2014-08-06 ENCOUNTER — Telehealth: Payer: Self-pay | Admitting: *Deleted

## 2014-08-06 VITALS — BP 132/74 | HR 55 | Temp 97.7°F | Ht 67.0 in | Wt 160.4 lb

## 2014-08-06 DIAGNOSIS — K219 Gastro-esophageal reflux disease without esophagitis: Secondary | ICD-10-CM | POA: Diagnosis not present

## 2014-08-06 DIAGNOSIS — K589 Irritable bowel syndrome without diarrhea: Secondary | ICD-10-CM

## 2014-08-06 NOTE — Telephone Encounter (Signed)
Submitted [employer] referral thru acuity connect for authorization to Dr. Barney Drain with authorization number [id number]  Requesting provider: [name], MD  Treating provider:Sandi Fields, MD  Start Date:08/06/14  End Date: 02/02/15  Dx:K58-Irritable Bowel syndrome  Number of visits:6  Copy has been faxed to Dr. Oneida Alar office for review

## 2014-08-06 NOTE — Progress Notes (Signed)
ON RECALL LIST  °

## 2014-08-06 NOTE — Assessment & Plan Note (Signed)
SYMPTOMS CONTROLLED/RESOLVED ON NEXIUM. FLARE AFTER CHITLINS W/ BLK PEPPER AND HOT SAUCE.  COMPLETE PA FOR NEXIUM SAMPLE NEXIUM AND DEXILANT GIVEN FOLLOW UP IN 4 MOS.

## 2014-08-06 NOTE — Assessment & Plan Note (Signed)
POST-INFECTOUS COMPLICATED BY LACTOSE INTOLERANCE AND PANCREATIC INSUFFICIENCY. SYMPTOMS FAIRLY WELL CONTROLLED.  CONTINUE CREON LACTASE PRN CONTINUE TO MONITOR SYMPTOMS. FOLLOW UP IN 4 MOS.

## 2014-08-06 NOTE — Progress Notes (Signed)
Subjective:    Patient ID: Charlotte Henry, female    DOB: 04/07/1941, 74 y.o.   MRN: [id number]  [name], MD  HPI Ate CHITTERLINGS ON SUN and set off diarrheA/NAUSEA/DRY HEAVES. BMs: CREON(1 A DAY WITH HER ONE MEAL). LACTASE USING IT WITH DAIRY. BLOATING BETTER. BMS: 1-2X/DAY-FORMED.   Past Medical History  Diagnosis Date  . Hypertension   . ASCVD (arteriosclerotic cardiovascular disease)     Neg. stress nuc. 3/99; cath in 4/04,-80%LAD; 70% RCA, -> DESx2; residual 60% distal RCA; nl EF  . Hyperkalemia     potassium of 5.1-5.3 in 2011  . Diabetes mellitus     Treated with oral agents; HgbA1c of 7in 08/2008  . Gout   . GERD (gastroesophageal reflux disease)   . Diverticulosis   . Hyperlipidemia   . Chronic kidney disease (CKD), stage III (moderate)     Creat.= 1.78 in 11/2009  . PVD (peripheral vascular disease)      moderate to severely decreased ABI's in 8/08, sig. aortic inflow disease; repeat ABI's in 2011 improved- mild disease on the left and moderate to severe disease on the right  . Cerebrovascular disease      mild; <50% ICA with moderate plaque bilaterally in 12/2009  . Asthmatic bronchitis   . Tobacco abuse     50 pack years continuing at 1/2 pack per day  . CHF (congestive heart failure)   . Arthritis   . Anemia   . Nervousness(799.21)   . Stroke 2012    Mini strokes- Temporary  blindness  . Myocardial infarction   . Coronary disease   . Arterial fibromuscular dysplasia   . Clostridium difficile colitis OCT 2014  . Carotid artery occlusion    Past Surgical History  Procedure Laterality Date  . Cholecystectomy    . Carpel tunnel release      bilateral  . Total abdominal hysterectomy w/ bilateral salpingoophorectomy    . Colonoscopy  2009  . Abdominal hysterectomy      Allergies  Allergen Reactions  . Ace Inhibitors Hives, Shortness Of Breath and Swelling  . Aspirin Other (See Comments)    Blood in stool and nose bleed  . Codeine Swelling  .  Pineapple Shortness Of Breath and Swelling  . Plasticized Base [Plastibase] Hives and Swelling  . Ultram [Tramadol] Swelling  . Adhesive [Tape] Swelling and Rash  . Naprosyn [Naproxen] Swelling  . Clindamycin/Lincomycin Other (See Comments)    Burning sensation throat, abdomen  . Linzess [Linaclotide]     DIARRHEA, FECAL INCONTINENCE  . Nsaids   . Penicillins   . Varenicline Tartrate Swelling    Current Outpatient Prescriptions  Medication Sig Dispense Refill  . albuterol (PROVENTIL HFA;VENTOLIN HFA) 108 (90 BASE) MCG/ACT inhaler Inhale 2 puffs into the lungs every 4 (four) hours as needed for wheezing or shortness of breath.    . ALPRAZolam (XANAX) 0.25 MG tablet Take 1 tablet (0.25 mg total) by mouth at bedtime as needed.    Marland Kitchen aspirin 81 MG tablet Take 81 mg by mouth daily.      . benzonatate (TESSALON) 100 MG capsule Take 1 capsule (100 mg total) by mouth 3 (three) times daily.    . calcium citrate-vitamin D (CITRACAL+D) 315-200 MG-UNIT per tablet Take 1 tablet by mouth daily.      . Cranberry 250 MG CAPS Take 250 mg by mouth daily.    Marland Kitchen EPINEPHrine 0.3 mg/0.3 mL IJ SOAJ injection Inject 0.3 mLs (0.3 mg total) into  the muscle once.    Marland Kitchen esomeprazole (NEXIUM) 40 MG capsule Take 1 capsule (40 mg total) by mouth daily.    Marland Kitchen lidocaine-prilocaine (EMLA) cream     . lipase/protease/amylase (CREON) 12000 UNITS CPEP capsule 1 with meals and snacks.    . mirabegron ER (MYRBETRIQ) 25 MG TB24 tablet Take 1 tablet (25 mg total) by mouth at bedtime. For bladder    . pravastatin (PRAVACHOL) 40 MG tablet TAKE 1 TABLET BY MOUTH ONCE DAILY.    . Probiotic Product (PROBIOTIC DAILY PO) Take by mouth daily.    . valsartan (DIOVAN) 160 MG tablet TAKE 1 TABLET BY MOUTH DAILY.    Marland Kitchen ezetimibe (ZETIA) 10 MG tablet TAKE 1 TABLET BY MOUTH ONCE A DAY.     Review of Systems     Objective:   Physical Exam  Constitutional: She is oriented to person, place, and time. She appears well-developed and  well-nourished. No distress.  HENT:  Head: Normocephalic and atraumatic.  Mouth/Throat: Oropharynx is clear and moist. No oropharyngeal exudate.  Eyes: Pupils are equal, round, and reactive to light. No scleral icterus.  Neck: Normal range of motion. Neck supple.  Cardiovascular: Normal rate, regular rhythm and normal heart sounds.   Pulmonary/Chest: Effort normal and breath sounds normal. No respiratory distress.  Abdominal: Soft. Bowel sounds are normal. She exhibits no distension. There is no tenderness.  Musculoskeletal: She exhibits no edema.  Lymphadenopathy:    She has no cervical adenopathy.  Neurological: She is alert and oriented to person, place, and time.  Psychiatric: She has a normal mood and affect.  Vitals reviewed.         Assessment & Plan:

## 2014-08-06 NOTE — Patient Instructions (Signed)
CONTINUE NEXIUM. TAKE 30 MINUTES BEFORE MEALS.  CONTINUE CREON AND LACTASE.  WE WILL TRY TO GET YOU NEXIUM APPROVED. CALL IN ONE MONTH IF IT IS NOT APPROVED.  FOLLOW UP IN 4 MOS.

## 2014-08-06 NOTE — Progress Notes (Signed)
cc'ed to pcp °

## 2014-08-07 ENCOUNTER — Encounter: Payer: Self-pay | Admitting: Cardiovascular Disease

## 2014-08-07 ENCOUNTER — Ambulatory Visit (INDEPENDENT_AMBULATORY_CARE_PROVIDER_SITE_OTHER): Payer: Commercial Managed Care - HMO | Admitting: Cardiovascular Disease

## 2014-08-07 VITALS — BP 134/72 | HR 60 | Ht 67.0 in | Wt 161.6 lb

## 2014-08-07 DIAGNOSIS — Z716 Tobacco abuse counseling: Secondary | ICD-10-CM

## 2014-08-07 DIAGNOSIS — I2510 Atherosclerotic heart disease of native coronary artery without angina pectoris: Secondary | ICD-10-CM | POA: Diagnosis not present

## 2014-08-07 DIAGNOSIS — M25511 Pain in right shoulder: Secondary | ICD-10-CM

## 2014-08-07 DIAGNOSIS — I739 Peripheral vascular disease, unspecified: Secondary | ICD-10-CM | POA: Diagnosis not present

## 2014-08-07 DIAGNOSIS — I10 Essential (primary) hypertension: Secondary | ICD-10-CM

## 2014-08-07 DIAGNOSIS — E785 Hyperlipidemia, unspecified: Secondary | ICD-10-CM | POA: Diagnosis not present

## 2014-08-07 NOTE — Patient Instructions (Signed)

## 2014-08-07 NOTE — Progress Notes (Signed)
Patient ID: Charlotte Henry, female   DOB: [birth year], 74 y.o.   MRN: [id number]      SUBJECTIVE: The patient is a 74 year old woman with a history of coronary artery disease and peripheral arterial disease. She underwent drug-eluting stent placement to the LAD and RCA in 2004. She also has a history of diabetes mellitus, tobacco abuse, hypertension, irritable bowel syndrome, GERD, and hyperlipidemia.  Carotid Dopplers in April 2015 showed less than 40% bilateral stenosis.  ABIs in October 2015 showed 0.49 on right and 0.61 on left indicative of severe bilateral arterial occlusive disease, and is followed by vascular surgery. She denies chest pain. She has right arm and shoulder pain when she raises it above her head or when reaching to her back. She continues to smoke 1 ppd.   Review of Systems: As per "subjective", otherwise negative.  Allergies  Allergen Reactions  . Ace Inhibitors Hives, Shortness Of Breath and Swelling  . Aspirin Other (See Comments)    Blood in stool and nose bleed  . Codeine Swelling  . Pineapple Shortness Of Breath and Swelling  . Plasticized Base [Plastibase] Hives and Swelling  . Ultram [Tramadol] Swelling  . Adhesive [Tape] Swelling and Rash  . Naprosyn [Naproxen] Swelling  . Clindamycin/Lincomycin Other (See Comments)    Burning sensation throat, abdomen  . Linzess [Linaclotide]     DIARRHEA, FECAL INCONTINENCE  . Nsaids   . Penicillins   . Varenicline Tartrate Swelling    Current Outpatient Prescriptions  Medication Sig Dispense Refill  . albuterol (PROVENTIL HFA;VENTOLIN HFA) 108 (90 BASE) MCG/ACT inhaler Inhale 2 puffs into the lungs every 4 (four) hours as needed for wheezing or shortness of breath. 1 Inhaler 0  . ALPRAZolam (XANAX) 0.25 MG tablet Take 1 tablet (0.25 mg total) by mouth at bedtime as needed. 30 tablet 1  . aspirin 81 MG tablet Take 81 mg by mouth daily.      . benzonatate (TESSALON) 100 MG capsule Take 1 capsule (100 mg total)  by mouth 3 (three) times daily. 20 capsule 1  . calcium citrate-vitamin D (CITRACAL+D) 315-200 MG-UNIT per tablet Take 1 tablet by mouth daily.      . Cranberry 250 MG CAPS Take 250 mg by mouth daily.    Marland Kitchen EPINEPHrine 0.3 mg/0.3 mL IJ SOAJ injection Inject 0.3 mLs (0.3 mg total) into the muscle once. 1 Device 3  . esomeprazole (NEXIUM) 40 MG capsule Take 1 capsule (40 mg total) by mouth daily. 30 capsule 3  . ezetimibe (ZETIA) 10 MG tablet TAKE 1 TABLET BY MOUTH ONCE A DAY. 30 tablet 6  . lidocaine-prilocaine (EMLA) cream     . lipase/protease/amylase (CREON) 12000 UNITS CPEP capsule 1 with meals and snacks. 270 capsule 5  . mirabegron ER (MYRBETRIQ) 25 MG TB24 tablet Take 1 tablet (25 mg total) by mouth at bedtime. For bladder 30 tablet 6  . pravastatin (PRAVACHOL) 40 MG tablet TAKE 1 TABLET BY MOUTH ONCE DAILY. 90 tablet 3  . Probiotic Product (PROBIOTIC DAILY PO) Take by mouth daily.    . valsartan (DIOVAN) 160 MG tablet TAKE 1 TABLET BY MOUTH DAILY. 30 tablet 6   No current facility-administered medications for this visit.    Past Medical History  Diagnosis Date  . Hypertension   . ASCVD (arteriosclerotic cardiovascular disease)     Neg. stress nuc. 3/99; cath in 4/04,-80%LAD; 70% RCA, -> DESx2; residual 60% distal RCA; nl EF  . Hyperkalemia     potassium of 5.1-5.3  in 2011  . Diabetes mellitus     Treated with oral agents; HgbA1c of 7in 08/2008  . Gout   . GERD (gastroesophageal reflux disease)   . Diverticulosis   . Hyperlipidemia   . Chronic kidney disease (CKD), stage III (moderate)     Creat.= 1.78 in 11/2009  . PVD (peripheral vascular disease)      moderate to severely decreased ABI's in 8/08, sig. aortic inflow disease; repeat ABI's in 2011 improved- mild disease on the left and moderate to severe disease on the right  . Cerebrovascular disease      mild; <50% ICA with moderate plaque bilaterally in 12/2009  . Asthmatic bronchitis   . Tobacco abuse     50 pack years  continuing at 1/2 pack per day  . CHF (congestive heart failure)   . Arthritis   . Anemia   . Nervousness(799.21)   . Stroke 2012    Mini strokes- Temporary  blindness  . Myocardial infarction   . Coronary disease   . Arterial fibromuscular dysplasia   . Clostridium difficile colitis OCT 2014  . Carotid artery occlusion     Past Surgical History  Procedure Laterality Date  . Cholecystectomy    . Carpel tunnel release      bilateral  . Total abdominal hysterectomy w/ bilateral salpingoophorectomy    . Colonoscopy  2009  . Abdominal hysterectomy      History   Social History  . Marital Status: Widowed    Spouse Name: N/A  . Number of Children: N/A  . Years of Education: N/A   Occupational History  . Not on file.   Social History Main Topics  . Smoking status: Current Every Day Smoker -- 0.50 packs/day for 50 years    Types: Cigarettes    Start date: 02/02/1966  . Smokeless tobacco: Never Used     Comment: 6 cig. a day  . Alcohol Use: 8.4 oz/week    14 Cans of beer per week  . Drug Use: No  . Sexual Activity: Not on file   Other Topics Concern  . Not on file   Social History Narrative     Filed Vitals:   08/07/14 1328  BP: 134/72  Pulse: 60  Height: 5\' 7"  (1.702 m)  Weight: 161 lb 9.6 oz (73.301 kg)    PHYSICAL EXAM General: NAD  Neck: No JVD, no thyromegaly.  Lungs: Clear to auscultation bilaterally with normal respiratory effort.  CV: Nondisplaced PMI. Bradycardic, regular rhythm, normal S1/S2, no Z6/X0, I/VI systolic murmur along left sternal border. No pretibial or periankle edema.  Abdomen: Soft, nontender, no hepatosplenomegaly, no distention.  Neurologic: Alert and oriented x 3.  Psych: Normal affect.  Skin: Normal. Musculoskeletal: Normal range of motion, no gross deformities. Extremities: No clubbing or cyanosis.   ECG: Most recent ECG reviewed.      ASSESSMENT AND PLAN: 1. CAD: Stable ischemic heart disease on current  medical therapy. No indication for noninvasive testing at this time. Continue aspirin, Zetia, and pravastatin.  2. Essential HTN: Well controlled on current therapy. Continue current dose of valsartan.  3. Hyperlipidemia: Lipid panel on 07/01/14 showed HDL 45, LDL 53. No change in current therapy which includes Zetia and pravastatin.  4. Bradycardia and fatigue: Stable at this time. If symptoms recur, would consider Holter monitoring and stress testing.  5. Peripheral vascular disease: Noninvasive testing results as noted above. Followed by vascular surgery. 6. Tobacco abuse: Extensive cessation counseling provided, but I doubt  her willingness to quit. 7. Right shoulder/arm pain: It sounds like a tendinitis (perhaps supraspinatus) for which I have recommended physical therapy.  Dispo: f/u 6 months.   Kate Sable, M.D., F.A.C.C.

## 2014-08-28 ENCOUNTER — Encounter: Payer: Self-pay | Admitting: Family Medicine

## 2014-09-03 ENCOUNTER — Other Ambulatory Visit: Payer: Self-pay | Admitting: Family

## 2014-09-03 DIAGNOSIS — I6523 Occlusion and stenosis of bilateral carotid arteries: Secondary | ICD-10-CM

## 2014-09-03 DIAGNOSIS — I739 Peripheral vascular disease, unspecified: Secondary | ICD-10-CM

## 2014-09-09 ENCOUNTER — Encounter (HOSPITAL_COMMUNITY): Payer: Self-pay

## 2014-09-09 ENCOUNTER — Ambulatory Visit: Payer: Medicare HMO | Admitting: Vascular Surgery

## 2014-09-09 ENCOUNTER — Other Ambulatory Visit (HOSPITAL_COMMUNITY): Payer: Commercial Managed Care - HMO

## 2014-09-09 ENCOUNTER — Other Ambulatory Visit (HOSPITAL_COMMUNITY): Payer: Medicare HMO

## 2014-09-09 ENCOUNTER — Ambulatory Visit: Payer: Medicare HMO | Admitting: Family

## 2014-09-09 ENCOUNTER — Ambulatory Visit: Payer: Commercial Managed Care - HMO | Admitting: Family

## 2014-09-15 ENCOUNTER — Telehealth: Payer: Self-pay | Admitting: Family Medicine

## 2014-09-15 NOTE — Telephone Encounter (Signed)
Patient is calling to say that she had appointment with dr Lorrine Kin her heart doctor and insurance denied it because she needed a referral, and has other questions about needing referral to other doctors  Please call her at [phone]

## 2014-09-16 NOTE — Telephone Encounter (Signed)
Called pt back and she stated that she received a Denial of Payment from [facility] stating reason is PCP never placed the referral, I explained to pt that I never received a call from her or her Cardiology office stating she is needing a referral placed to [facility], I informed her that the cardiology is appealing it and she does not have to pay for any balance. Pt understood.  Pt states [facility] does [facility] Scot Dock and needs a referral for that time. I informed pt that I will submit it and have it done by that time.

## 2014-09-18 ENCOUNTER — Other Ambulatory Visit: Payer: Self-pay | Admitting: Cardiovascular Disease

## 2014-09-23 ENCOUNTER — Telehealth: Payer: Self-pay | Admitting: *Deleted

## 2014-09-23 NOTE — Telephone Encounter (Signed)
Submitted referral thru acuity connect,pending authorizaton

## 2014-09-23 NOTE — Telephone Encounter (Signed)
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

## 2014-10-01 ENCOUNTER — Other Ambulatory Visit: Payer: Self-pay | Admitting: *Deleted

## 2014-10-01 MED ORDER — EZETIMIBE 10 MG PO TABS: 10.0000 mg | ORAL_TABLET | Freq: Every day | ORAL | Status: DC

## 2014-10-07 ENCOUNTER — Other Ambulatory Visit (HOSPITAL_COMMUNITY): Payer: Commercial Managed Care - HMO

## 2014-10-07 ENCOUNTER — Encounter (HOSPITAL_COMMUNITY): Payer: Commercial Managed Care - HMO

## 2014-10-07 ENCOUNTER — Ambulatory Visit: Payer: Self-pay | Admitting: Vascular Surgery

## 2014-10-13 ENCOUNTER — Encounter: Payer: Self-pay | Admitting: Vascular Surgery

## 2014-10-14 ENCOUNTER — Ambulatory Visit (HOSPITAL_COMMUNITY)
Admission: RE | Admit: 2014-10-14 | Discharge: 2014-10-14 | Disposition: A | Discharge status: Home / Self Care | Payer: Commercial Managed Care - HMO | Source: Ambulatory Visit | Attending: Vascular Surgery | Admitting: Vascular Surgery

## 2014-10-14 ENCOUNTER — Ambulatory Visit (INDEPENDENT_AMBULATORY_CARE_PROVIDER_SITE_OTHER)
Admission: RE | Admit: 2014-10-14 | Discharge: 2014-10-14 | Disposition: A | Discharge status: Home / Self Care | Payer: Commercial Managed Care - HMO | Source: Ambulatory Visit | Attending: Family | Admitting: Family

## 2014-10-14 ENCOUNTER — Encounter: Payer: Self-pay | Admitting: Vascular Surgery

## 2014-10-14 ENCOUNTER — Ambulatory Visit (INDEPENDENT_AMBULATORY_CARE_PROVIDER_SITE_OTHER): Payer: Commercial Managed Care - HMO | Admitting: Vascular Surgery

## 2014-10-14 VITALS — BP 132/65 | HR 83 | Resp 16 | Ht 67.0 in | Wt 160.0 lb

## 2014-10-14 DIAGNOSIS — E785 Hyperlipidemia, unspecified: Secondary | ICD-10-CM | POA: Insufficient documentation

## 2014-10-14 DIAGNOSIS — I70209 Unspecified atherosclerosis of native arteries of extremities, unspecified extremity: Secondary | ICD-10-CM | POA: Diagnosis not present

## 2014-10-14 DIAGNOSIS — I739 Peripheral vascular disease, unspecified: Secondary | ICD-10-CM | POA: Insufficient documentation

## 2014-10-14 DIAGNOSIS — I10 Essential (primary) hypertension: Secondary | ICD-10-CM | POA: Insufficient documentation

## 2014-10-14 DIAGNOSIS — R200 Anesthesia of skin: Secondary | ICD-10-CM | POA: Diagnosis not present

## 2014-10-14 DIAGNOSIS — I6523 Occlusion and stenosis of bilateral carotid arteries: Secondary | ICD-10-CM | POA: Diagnosis not present

## 2014-10-14 NOTE — Progress Notes (Signed)
Vascular and Vein Specialist of   Patient name: Charlotte Henry MRN: [id number] DOB: 1940-12-25 Sex: female  REASON FOR VISIT: Follow up  HPI: Charlotte Henry is a 74 y.o. female who I last saw in September 2012 with peripheral vascular disease. At that time, she had an ABI 48% on the right and 80% on the left. She had evidence of infrainguinal bilateral arterial occlusive disease. Her symptoms were stable.  Since I saw her last she's been doing fairly well. She denies claudication. She describes pain in her feet is equal on both sides it is aggravated by activity and relieved with rest. However I do not get any history of rest pain or history of nonhealing ulcers. She smokes cigarettes occasionally.  She remains fairly active. She participates in multiple senior projects and events. She is somewhat disappointed that she did not make a hole in one during her [place].  Past Medical History  Diagnosis Date  . Hypertension   . ASCVD (arteriosclerotic cardiovascular disease)     Neg. stress nuc. 3/99; cath in 4/04,-80%LAD; 70% RCA, -> DESx2; residual 60% distal RCA; nl EF  . Hyperkalemia     potassium of 5.1-5.3 in 2011  . Diabetes mellitus     Treated with oral agents; HgbA1c of 7in 08/2008  . Gout   . GERD (gastroesophageal reflux disease)   . Diverticulosis   . Hyperlipidemia   . Chronic kidney disease (CKD), stage III (moderate)     Creat.= 1.78 in 11/2009  . PVD (peripheral vascular disease)      moderate to severely decreased ABI's in 8/08, sig. aortic inflow disease; repeat ABI's in 2011 improved- mild disease on the left and moderate to severe disease on the right  . Cerebrovascular disease      mild; <50% ICA with moderate plaque bilaterally in 12/2009  . Asthmatic bronchitis   . Tobacco abuse     50 pack years continuing at 1/2 pack per day  . CHF (congestive heart failure)   . Arthritis   . Anemia   . Nervousness(799.21)   .  Stroke 2012    Mini strokes- Temporary  blindness  . Myocardial infarction   . Coronary disease   . Arterial fibromuscular dysplasia   . Clostridium difficile colitis OCT 2014  . Carotid artery occlusion    Family History  Problem Relation Age of Onset  . Liver disease Mother 42  . Hypertension Mother   . Cerebral aneurysm Father   . Aneurysm Father 56  . Cancer Sister   . Liver disease Brother   . Liver disease Brother   . Liver disease Brother   . Diabetes type II Brother   . Alcohol abuse Brother   . Cancer Sister    SOCIAL HISTORY: History  Substance Use Topics  . Smoking status: Current Every Day Smoker -- 0.50 packs/day for 50 years    Types: Cigarettes    Start date: 02/02/1966  . Smokeless tobacco: Never Used     Comment: 6 cig. a day  . Alcohol Use: No   Allergies  Allergen Reactions  . Ace Inhibitors Hives, Shortness Of Breath and Swelling  . Aspirin Other (See Comments)    Blood in stool and nose bleed  . Codeine Swelling  . Pineapple Shortness Of Breath and Swelling  . Plasticized Base [Plastibase] Hives and Swelling  . Ultram [Tramadol] Swelling  . Adhesive [Tape] Swelling and Rash  . Naprosyn [Naproxen]  Swelling  . Clindamycin/Lincomycin Other (See Comments)    Burning sensation throat, abdomen  . Linzess [Linaclotide]     DIARRHEA, FECAL INCONTINENCE  . Nsaids   . Penicillins   . Varenicline Tartrate Swelling   Current Outpatient Prescriptions  Medication Sig Dispense Refill  . albuterol (PROVENTIL HFA;VENTOLIN HFA) 108 (90 BASE) MCG/ACT inhaler Inhale 2 puffs into the lungs every 4 (four) hours as needed for wheezing or shortness of breath. 1 Inhaler 0  . ALPRAZolam (XANAX) 0.25 MG tablet Take 1 tablet (0.25 mg total) by mouth at bedtime as needed. 30 tablet 1  . aspirin 81 MG tablet Take 81 mg by mouth daily.      . calcium citrate-vitamin D (CITRACAL+D) 315-200 MG-UNIT per tablet Take 1 tablet by mouth daily.      . Cranberry 250 MG CAPS Take  250 mg by mouth daily.    Marland Kitchen EPINEPHrine 0.3 mg/0.3 mL IJ SOAJ injection Inject 0.3 mLs (0.3 mg total) into the muscle once. 1 Device 3  . esomeprazole (NEXIUM) 40 MG capsule Take 1 capsule (40 mg total) by mouth daily. 30 capsule 3  . ezetimibe (ZETIA) 10 MG tablet Take 1 tablet (10 mg total) by mouth daily. 90 tablet 3  . lidocaine-prilocaine (EMLA) cream     . lipase/protease/amylase (CREON) 12000 UNITS CPEP capsule 1 with meals and snacks. 270 capsule 5  . mirabegron ER (MYRBETRIQ) 25 MG TB24 tablet Take 1 tablet (25 mg total) by mouth at bedtime. For bladder 30 tablet 6  . pravastatin (PRAVACHOL) 40 MG tablet TAKE 1 TABLET BY MOUTH ONCE DAILY. 90 tablet 3  . Probiotic Product (PROBIOTIC DAILY PO) Take by mouth daily.    . valsartan (DIOVAN) 160 MG tablet TAKE 1 TABLET BY MOUTH DAILY. 30 tablet 6  . benzonatate (TESSALON) 100 MG capsule Take 1 capsule (100 mg total) by mouth 3 (three) times daily. (Patient not taking: Reported on 10/14/2014) 20 capsule 1   No current facility-administered medications for this visit.   REVIEW OF SYSTEMS: Valu.Nieves ] denotes positive finding; [  ] denotes negative finding  CARDIOVASCULAR:  '[ ]'$  chest pain   '[ ]'$  chest pressure   '[ ]'$  palpitations   Valu.Nieves ] orthopnea   '[ ]'$  dyspnea on exertion   Valu.Nieves ] claudication   '[ ]'$  rest pain   '[ ]'$  DVT   '[ ]'$  phlebitis PULMONARY:   Valu.Nieves ] productive cough   '[ ]'$  asthma   '[ ]'$  wheezing NEUROLOGIC:   Valu.Nieves ] weakness  '[ ]'$  paresthesias  '[ ]'$  aphasia  '[ ]'$  amaurosis  '[ ]'$  dizziness HEMATOLOGIC:   '[ ]'$  bleeding problems   '[ ]'$  clotting disorders MUSCULOSKELETAL:  '[ ]'$  joint pain   '[ ]'$  joint swelling '[ ]'$  leg swelling GASTROINTESTINAL: '[ ]'$   blood in stool  '[ ]'$   hematemesis GENITOURINARY:  '[ ]'$   dysuria  '[ ]'$   hematuria PSYCHIATRIC:  '[ ]'$  history of major depression INTEGUMENTARY:  '[ ]'$  rashes  '[ ]'$  ulcers CONSTITUTIONAL:  '[ ]'$  fever   '[ ]'$  chills  PHYSICAL EXAM: Filed Vitals:   10/14/14 1240 10/14/14 1246  BP: 132/67 132/65  Pulse: 83 83  Resp: 16     Height: '5\' 7"'$  (1.702 m)   Weight: 160 lb (72.576 kg)    GENERAL: The patient is a well-nourished female, in no acute distress. The vital signs are documented above. CARDIOVASCULAR: There is a regular rate and rhythm. I do not detect carotid  bruits. She has a palpable right femoral pulse and a slightly diminished left femoral pulse. I cannot palpate pedal pulses. She has no significant lower extremity swelling. PULMONARY: There is good air exchange bilaterally without wheezing or rales. ABDOMEN: Soft and non-tender with normal pitched bowel sounds.  MUSCULOSKELETAL: There are no major deformities or cyanosis. NEUROLOGIC: No focal weakness or paresthesias are detected. SKIN: She has no ischemic ulcers. PSYCHIATRIC: The patient has a normal affect.  DATA:  I have independently interpreted her arterial Doppler study which shows that she has monophasic Doppler signals in the dorsalis pedis and posterior tibial positions bilaterally. ABI on the right is 44%. Toe pressure on the right is 48 mmHg. ABI on the left is 56%. Toe pressure on the left is 51 mmHg.  MEDICAL ISSUES: PERIPHERAL VASCULAR DISEASE: The patient has stable bilateral infrainguinal arterial occlusive disease. She remains very active with her different group activities. We have discussed the importance of tobacco cessation. She is on aspirin and is on a statin. She understands we would not consider arteriography and intervention when she developed rest pain or nonhealing ulcer. I've ordered follow up ABIs 1 year and I'll see her back at that time. She knows to call sooner if she has problems.  Return in about 1 year (around 10/14/2015).   Deitra Mayo [facility] [place]: [phone]

## 2014-10-20 NOTE — Addendum Note (Signed)
Addended by: Thresa Ross C on: 10/20/2014 10:27 AM   Modules accepted: Orders

## 2014-10-21 ENCOUNTER — Other Ambulatory Visit: Payer: Self-pay | Admitting: Family Medicine

## 2014-10-21 NOTE — Telephone Encounter (Signed)
Medication filled x1 with no refills.  

## 2014-11-17 ENCOUNTER — Encounter: Payer: Self-pay | Admitting: Gastroenterology

## 2014-12-18 ENCOUNTER — Other Ambulatory Visit: Payer: Self-pay | Admitting: Cardiovascular Disease

## 2014-12-21 ENCOUNTER — Encounter: Payer: Self-pay | Admitting: Physician Assistant

## 2014-12-21 ENCOUNTER — Ambulatory Visit (INDEPENDENT_AMBULATORY_CARE_PROVIDER_SITE_OTHER): Payer: Commercial Managed Care - HMO | Admitting: Physician Assistant

## 2014-12-21 VITALS — BP 130/70 | HR 60 | Temp 98.6°F | Resp 18 | Wt 157.0 lb

## 2014-12-21 DIAGNOSIS — E119 Type 2 diabetes mellitus without complications: Secondary | ICD-10-CM | POA: Diagnosis not present

## 2014-12-21 DIAGNOSIS — H26493 Other secondary cataract, bilateral: Secondary | ICD-10-CM | POA: Diagnosis not present

## 2014-12-21 DIAGNOSIS — H35033 Hypertensive retinopathy, bilateral: Secondary | ICD-10-CM | POA: Diagnosis not present

## 2014-12-21 DIAGNOSIS — N390 Urinary tract infection, site not specified: Secondary | ICD-10-CM | POA: Diagnosis not present

## 2014-12-21 DIAGNOSIS — Z961 Presence of intraocular lens: Secondary | ICD-10-CM | POA: Diagnosis not present

## 2014-12-21 LAB — URINALYSIS, ROUTINE W REFLEX MICROSCOPIC
GLUCOSE, UA: NEGATIVE mg/dL
HGB URINE DIPSTICK: NEGATIVE
Ketones, ur: NEGATIVE mg/dL
NITRITE: NEGATIVE
Protein, ur: NEGATIVE mg/dL
Specific Gravity, Urine: 1.025 (ref 1.005–1.030)
UROBILINOGEN UA: 2 mg/dL — AB (ref 0.0–1.0)
pH: 5.5 (ref 5.0–8.0)

## 2014-12-21 LAB — URINALYSIS, MICROSCOPIC ONLY
Casts: NONE SEEN
Crystals: NONE SEEN
RBC / HPF: NONE SEEN RBC/hpf (ref <3)

## 2014-12-21 LAB — HM DIABETES EYE EXAM

## 2014-12-21 MED ORDER — CIPROFLOXACIN HCL 500 MG PO TABS: 500.0000 mg | ORAL_TABLET | Freq: Two times a day (BID) | ORAL | Status: DC

## 2014-12-21 NOTE — Progress Notes (Signed)
Patient ID: Charlotte Henry MRN: [id number], DOB: 1940-11-23, 74 y.o. Date of Encounter: 12/21/2014, 4:39 PM    Chief Complaint:  Chief Complaint  Patient presents with  . Back Pain  . Urinary Frequency     HPI: 74 y.o. year old AA female points to her low back and says that yesterday morning that was real sore and stiff when she first woke up. Says that has gotten better. However says that she has also been having a lot of urinary frequency yesterday and today as well as dysuria. Says that it burns when she urinates yesterday and today. Has had no fevers. As noticed no other symptoms.     Home Meds:   Outpatient Prescriptions Prior to Visit  Medication Sig Dispense Refill  . albuterol (PROVENTIL HFA;VENTOLIN HFA) 108 (90 BASE) MCG/ACT inhaler Inhale 2 puffs into the lungs every 4 (four) hours as needed for wheezing or shortness of breath. 1 Inhaler 0  . ALPRAZolam (XANAX) 0.25 MG tablet Take 1 tablet (0.25 mg total) by mouth at bedtime as needed. 30 tablet 1  . aspirin 81 MG tablet Take 81 mg by mouth daily.      . calcium citrate-vitamin D (CITRACAL+D) 315-200 MG-UNIT per tablet Take 1 tablet by mouth daily.      . Cranberry 250 MG CAPS Take 250 mg by mouth daily.    Marland Kitchen EPINEPHrine 0.3 mg/0.3 mL IJ SOAJ injection Inject 0.3 mLs (0.3 mg total) into the muscle once. 1 Device 3  . esomeprazole (NEXIUM) 40 MG capsule Take 1 capsule (40 mg total) by mouth daily. 30 capsule 3  . ezetimibe (ZETIA) 10 MG tablet Take 1 tablet (10 mg total) by mouth daily. 90 tablet 3  . lidocaine-prilocaine (EMLA) cream     . lipase/protease/amylase (CREON) 12000 UNITS CPEP capsule 1 with meals and snacks. 270 capsule 5  . mirabegron ER (MYRBETRIQ) 25 MG TB24 tablet Take 1 tablet (25 mg total) by mouth at bedtime. For bladder 30 tablet 6  . pravastatin (PRAVACHOL) 40 MG tablet TAKE 1 TABLET BY MOUTH ONCE DAILY. 90 tablet 3  . Probiotic Product (PROBIOTIC DAILY PO) Take by mouth daily.    . valsartan  (DIOVAN) 160 MG tablet TAKE 1 TABLET BY MOUTH DAILY. 90 tablet 0  . benzonatate (TESSALON) 100 MG capsule Take 1 capsule (100 mg total) by mouth 3 (three) times daily. (Patient not taking: Reported on 10/14/2014) 20 capsule 1   No facility-administered medications prior to visit.    Allergies:  Allergies  Allergen Reactions  . Ace Inhibitors Hives, Shortness Of Breath and Swelling  . Aspirin Other (See Comments)    Blood in stool and nose bleed  . Codeine Swelling  . Pineapple Shortness Of Breath and Swelling  . Plasticized Base [Plastibase] Hives and Swelling  . Ultram [Tramadol] Swelling  . Adhesive [Tape] Swelling and Rash  . Naprosyn [Naproxen] Swelling  . Clindamycin/Lincomycin Other (See Comments)    Burning sensation throat, abdomen  . Linzess [Linaclotide]     DIARRHEA, FECAL INCONTINENCE  . Nsaids   . Penicillins   . Varenicline Tartrate Swelling      Review of Systems: See HPI for pertinent ROS. All other ROS negative.    Physical Exam: Blood pressure 130/70, pulse 60, temperature 98.6 F (37 C), temperature source Oral, resp. rate 18, weight 157 lb (71.215 kg)., Body mass index is 24.58 kg/(m^2). General:  WNWD AAF. Appears in no acute distress. Neck: Supple. No thyromegaly. No lymphadenopathy.  Lungs: Clear bilaterally to auscultation without wheezes, rales, or rhonchi. Breathing is unlabored. Heart: Regular rhythm. No murmurs, rubs, or gallops. Abdomen: Soft, non-tender, non-distended with normoactive bowel sounds. No hepatomegaly. No rebound/guarding. No obvious abdominal masses. Msk:  Strength and tone normal for age. No tenderness with percussion of costophrenic angles bilaterally. Points to her low back bilaterally as the area that she was having discomfort yesterday morning. Extremities/Skin: Warm and dry.  Neuro: Alert and oriented X 3. Moves all extremities spontaneously. Gait is normal. CNII-XII grossly in tact. Psych:  Responds to questions appropriately  with a normal affect.   Results for orders placed or performed in visit on 12/21/14  Urinalysis, Routine w reflex microscopic (not at [facility])  Result Value Ref Range   Color, Urine YELLOW YELLOW   APPearance CLEAR CLEAR   Specific Gravity, Urine 1.025 1.005 - 1.030   pH 5.5 5.0 - 8.0   Glucose, UA NEG NEG mg/dL   Bilirubin Urine SMALL (A) NEG   Ketones, ur NEG NEG mg/dL   Hgb urine dipstick NEG NEG   Protein, ur NEG NEG mg/dL   Urobilinogen, UA 2 (H) 0.0 - 1.0 mg/dL   Nitrite NEG NEG   Leukocytes, UA SMALL (A) NEG  Urine Microscopic  Result Value Ref Range   Squamous Epithelial / LPF FEW RARE   Crystals NONE SEEN NONE SEEN   Casts NONE SEEN NONE SEEN   WBC, UA 0-2 <3 WBC/hpf   RBC / HPF NONE SEEN <3 RBC/hpf   Bacteria, UA FEW (A) RARE   Urine-Other MUCUS STRANDS SEEN      ASSESSMENT AND PLAN:  74 y.o. year old female with  1. Urinary tract infection without hematuria, site unspecified I discussed with her that I think the low back pain she was having is musculoskeletal. So I do not think this is related to any urine infection. Discussed whether she really was having much burning with urination and a lot of urinary frequency compared to usual. She says yes definitely that the frequency has been much worse than usual and she defineitely has been having burning with urination for the past 2 days. Discussed that her urinalysis is not completely normal but also does not shows definite/severe UTI. She says that this frequency and dysuria are not heard usual. She is to go ahead and start Cipro and take as directed. Also will follow up culture results. - Urinalysis, Routine w reflex microscopic (not at [facility]) - ciprofloxacin (CIPRO) 500 MG tablet; Take 1 tablet (500 mg total) by mouth 2 (two) times daily.  Dispense: 14 tablet; Refill: 0 - Urine culture   Signed, [address] [place], [place], [facility] 12/21/2014 4:39 PM

## 2014-12-23 LAB — URINE CULTURE
Colony Count: NO GROWTH
ORGANISM ID, BACTERIA: NO GROWTH

## 2014-12-30 ENCOUNTER — Encounter: Payer: Self-pay | Admitting: Family Medicine

## 2014-12-30 ENCOUNTER — Ambulatory Visit (INDEPENDENT_AMBULATORY_CARE_PROVIDER_SITE_OTHER): Payer: Commercial Managed Care - HMO | Admitting: Family Medicine

## 2014-12-30 VITALS — BP 130/72 | HR 68 | Temp 98.3°F | Resp 16 | Ht 67.0 in | Wt 155.0 lb

## 2014-12-30 DIAGNOSIS — N183 Chronic kidney disease, stage 3 unspecified: Secondary | ICD-10-CM

## 2014-12-30 DIAGNOSIS — M159 Polyosteoarthritis, unspecified: Secondary | ICD-10-CM

## 2014-12-30 DIAGNOSIS — M545 Low back pain, unspecified: Secondary | ICD-10-CM

## 2014-12-30 DIAGNOSIS — E1159 Type 2 diabetes mellitus with other circulatory complications: Secondary | ICD-10-CM

## 2014-12-30 DIAGNOSIS — I10 Essential (primary) hypertension: Secondary | ICD-10-CM

## 2014-12-30 DIAGNOSIS — M8949 Other hypertrophic osteoarthropathy, multiple sites: Secondary | ICD-10-CM

## 2014-12-30 DIAGNOSIS — N390 Urinary tract infection, site not specified: Secondary | ICD-10-CM | POA: Diagnosis not present

## 2014-12-30 DIAGNOSIS — M1990 Unspecified osteoarthritis, unspecified site: Secondary | ICD-10-CM | POA: Insufficient documentation

## 2014-12-30 DIAGNOSIS — M150 Primary generalized (osteo)arthritis: Secondary | ICD-10-CM

## 2014-12-30 DIAGNOSIS — M549 Dorsalgia, unspecified: Secondary | ICD-10-CM | POA: Insufficient documentation

## 2014-12-30 LAB — CBC WITH DIFFERENTIAL/PLATELET
BASOS PCT: 1 % (ref 0–1)
Basophils Absolute: 0.1 10*3/uL (ref 0.0–0.1)
EOS ABS: 0.3 10*3/uL (ref 0.0–0.7)
Eosinophils Relative: 4 % (ref 0–5)
HEMATOCRIT: 35.9 % — AB (ref 36.0–46.0)
HEMOGLOBIN: 11.9 g/dL — AB (ref 12.0–15.0)
LYMPHS ABS: 2.8 10*3/uL (ref 0.7–4.0)
Lymphocytes Relative: 42 % (ref 12–46)
MCH: 30.1 pg (ref 26.0–34.0)
MCHC: 33.1 g/dL (ref 30.0–36.0)
MCV: 90.7 fL (ref 78.0–100.0)
MONOS PCT: 8 % (ref 3–12)
MPV: 9.3 fL (ref 8.6–12.4)
Monocytes Absolute: 0.5 10*3/uL (ref 0.1–1.0)
NEUTROS ABS: 3 10*3/uL (ref 1.7–7.7)
Neutrophils Relative %: 45 % (ref 43–77)
PLATELETS: 222 10*3/uL (ref 150–400)
RBC: 3.96 MIL/uL (ref 3.87–5.11)
RDW: 14.7 % (ref 11.5–15.5)
WBC: 6.6 10*3/uL (ref 4.0–10.5)

## 2014-12-30 LAB — COMPREHENSIVE METABOLIC PANEL
ALBUMIN: 4.1 g/dL (ref 3.6–5.1)
ALK PHOS: 46 U/L (ref 33–130)
ALT: 9 U/L (ref 6–29)
AST: 16 U/L (ref 10–35)
BUN: 16 mg/dL (ref 7–25)
CALCIUM: 9.3 mg/dL (ref 8.6–10.4)
CHLORIDE: 109 meq/L (ref 98–110)
CO2: 26 mEq/L (ref 20–31)
Creat: 0.96 mg/dL — ABNORMAL HIGH (ref 0.60–0.93)
GLUCOSE: 92 mg/dL (ref 70–99)
POTASSIUM: 4.6 meq/L (ref 3.5–5.3)
Sodium: 143 mEq/L (ref 135–146)
Total Bilirubin: 0.5 mg/dL (ref 0.2–1.2)
Total Protein: 6.5 g/dL (ref 6.1–8.1)

## 2014-12-30 LAB — URINALYSIS, ROUTINE W REFLEX MICROSCOPIC
BILIRUBIN URINE: NEGATIVE
GLUCOSE, UA: NEGATIVE
Hgb urine dipstick: NEGATIVE
Ketones, ur: NEGATIVE
LEUKOCYTES UA: NEGATIVE
Nitrite: NEGATIVE
Protein, ur: NEGATIVE
Specific Gravity, Urine: 1.015 (ref 1.001–1.035)
pH: 7.5 (ref 5.0–8.0)

## 2014-12-30 LAB — HEMOGLOBIN A1C
HEMOGLOBIN A1C: 6.3 % — AB (ref <5.7)
MEAN PLASMA GLUCOSE: 134 mg/dL — AB (ref <117)

## 2014-12-30 NOTE — Progress Notes (Signed)
Patient ID: Charlotte Henry, female   DOB: 1940-07-12, 74 y.o.   MRN: [id number]   Subjective:    Patient ID: Charlotte Henry, female    DOB: February 27, 1941, 74 y.o.   MRN: [id number]  Patient presents for R Side Pain  patient with right lower back pain for the past few weeks. She is history of generalized arthritis. She was seen in my office about a week ago and she also has some dysuria at that time she was given Cipro for 3 days urine culture came back negative. Her urinary symptoms have cleared up she denies any nausea vomiting no abdominal pain. She did have some trouble passing her bowels but that is now back on track. Her pain is nonradiating just mostly on the right lower side. She denies any injury but then later stated she had been sweeping and mopping and had some pain. When she takes Tylenol this does improve her pain  She also states that she was seen by her eye doctor recently and they were concerned that her diabetes was worsening that she had some mild hemorrhage  Review Of Systems:  GEN- denies fatigue, fever, weight loss,weakness, recent illness HEENT- denies eye drainage, change in vision, nasal discharge, CVS- denies chest pain, palpitations RESP- denies SOB, cough, wheeze ABD- denies N/V, change in stools, abd pain GU- denies dysuria, hematuria, dribbling, incontinence MSK- + joint pain, muscle aches, injury Neuro- denies headache, dizziness, syncope, seizure activity       Objective:    BP 130/72 mmHg  Pulse 68  Temp(Src) 98.3 F (36.8 C) (Oral)  Resp 16  Ht '5\' 7"'$  (1.702 m)  Wt 155 lb (70.308 kg)  BMI 24.27 kg/m2 GEN- NAD, alert and oriented x3 HEENT- PERRL, EOMI, non injected sclera, pink conjunctiva, MMM, oropharynx clear Neck- Supple, fair ROM CVS- RRR, no murmur RESP-CTAB ABD-NABS,soft,NT,ND MSK- Spine NT, mild TTP Right paraspinals, no spasm, decreased ROM Spine, Hips, knees, neg SLR , non antalgic gait  EXT- No edema Pulses- Radial, 2+         Assessment & Plan:      Problem List Items Addressed This Visit    OA (osteoarthritis)   Essential hypertension - Primary   Relevant Orders   Comprehensive metabolic panel   CBC with Differential/Platelet   Diabetes mellitus   Relevant Orders   Hemoglobin A1c   Chronic kidney disease (CKD), stage III (moderate)   Back pain    Other Visit Diagnoses    UTI (lower urinary tract infection)        Relevant Orders    Urinalysis, Routine w reflex microscopic (not at [facility]) (Completed)       Note: This dictation was prepared with Dragon dictation along with smaller phrase technology. Any transcriptional errors that result from this process are unintentional.

## 2014-12-30 NOTE — Assessment & Plan Note (Signed)
I think her back pain is likely musculoskeletal and secondary to her arthritis. I see no red flags on exam. She is ambulating at baseline. I will have her take the Tylenol twice a day she can also use a topical muscle rub that she has at home for her arthritis. She wanted her urine. Check today her urinalysis was negative.

## 2014-12-30 NOTE — Assessment & Plan Note (Signed)
Diabetes has been diet controlled. I will recheck her A1c today along with her kidney function

## 2014-12-30 NOTE — Patient Instructions (Signed)
We will call with lab results Use tylenol twice a day  Heating pad to area F/U as previous

## 2015-01-01 ENCOUNTER — Telehealth: Payer: Self-pay | Admitting: Family Medicine

## 2015-01-01 ENCOUNTER — Ambulatory Visit (HOSPITAL_COMMUNITY)
Admission: RE | Admit: 2015-01-01 | Discharge: 2015-01-01 | Disposition: A | Discharge status: Home / Self Care | Payer: Commercial Managed Care - HMO | Source: Ambulatory Visit | Attending: Family Medicine | Admitting: Family Medicine

## 2015-01-01 DIAGNOSIS — M545 Low back pain: Secondary | ICD-10-CM | POA: Insufficient documentation

## 2015-01-01 DIAGNOSIS — R109 Unspecified abdominal pain: Secondary | ICD-10-CM | POA: Diagnosis not present

## 2015-01-01 NOTE — Telephone Encounter (Signed)
Patient states she is still hurting and would like the results from her blood work.  She also wanted to know if she needed to come back in. Please advise.

## 2015-01-01 NOTE — Telephone Encounter (Signed)
Call placed to patient.   Discussed that lab results noted WNL and no cause for pain noted.   States that she continues to have pain on R side.   MD please advise.

## 2015-01-01 NOTE — Telephone Encounter (Signed)
Order for X-ray placed.   Call placed to patient. [place].

## 2015-01-01 NOTE — Telephone Encounter (Signed)
Call placed to patient and patient made aware.   States that she will have X-rays done on Monday.

## 2015-01-01 NOTE — Telephone Encounter (Signed)
Send her for KUB and xray of l spine- dx- abdominal pain and back pain To [facility]

## 2015-01-04 ENCOUNTER — Telehealth: Payer: Self-pay | Admitting: *Deleted

## 2015-01-04 NOTE — Telephone Encounter (Signed)
Submitted [employer] referral thru acuity connect for authorization on 12/18/14 to Dr. Queen Blossom with authorization [phone]  Requesting provider: Neysa Hotter  Treating provider: Queen Blossom  Number of visits: 6  Start Date:12/21/14  End Date:06/19/15  Dx:E11.9-Type 2 Diabetes mellitus without complications  Copy has been faxed to Dr Leticia Clas OD for records/Review

## 2015-01-05 ENCOUNTER — Ambulatory Visit (HOSPITAL_COMMUNITY)
Admission: RE | Admit: 2015-01-05 | Discharge: 2015-01-05 | Disposition: A | Discharge status: Home / Self Care | Payer: Commercial Managed Care - HMO | Source: Ambulatory Visit | Attending: Family Medicine | Admitting: Family Medicine

## 2015-01-05 DIAGNOSIS — R109 Unspecified abdominal pain: Secondary | ICD-10-CM

## 2015-01-05 DIAGNOSIS — M8588 Other specified disorders of bone density and structure, other site: Secondary | ICD-10-CM | POA: Diagnosis not present

## 2015-01-05 DIAGNOSIS — M47816 Spondylosis without myelopathy or radiculopathy, lumbar region: Secondary | ICD-10-CM | POA: Diagnosis not present

## 2015-01-05 DIAGNOSIS — M545 Low back pain: Secondary | ICD-10-CM | POA: Insufficient documentation

## 2015-01-14 ENCOUNTER — Encounter: Payer: Self-pay | Admitting: Gastroenterology

## 2015-01-14 ENCOUNTER — Ambulatory Visit (INDEPENDENT_AMBULATORY_CARE_PROVIDER_SITE_OTHER): Payer: Commercial Managed Care - HMO | Admitting: Gastroenterology

## 2015-01-14 VITALS — BP 116/67 | HR 53 | Temp 98.7°F | Ht 67.0 in | Wt 152.8 lb

## 2015-01-14 DIAGNOSIS — K589 Irritable bowel syndrome without diarrhea: Secondary | ICD-10-CM

## 2015-01-14 NOTE — Progress Notes (Signed)
Subjective:    Patient ID: Charlotte Henry, female    DOB: [birth year], 74 y.o.   MRN: [id number]  [name], MD  HPI STOPPED TAKING CREON BECAUSE SHE THINKS IT CAUSED CONSTIPATION. TOOK IT FOR 3-4 MOS. STOPPED TAKING IT LAST ONE MO AGO. TOOK MIRALAX FOR CONSTIPATION LAST SAT TO MON NOW HAS DIARRHEA. WATERY BMS: 3-4/DAY.  PT DENIES FEVER, CHILLS, HEMATOCHEZIA, nausea, vomiting, melena, CHEST PAIN, SHORTNESS OF BREATH,  CHANGE IN BOWEL IN HABITS, abdominal pain, problems swallowing, OR heartburn or indigestion.   Past Medical History  Diagnosis Date  . Hypertension   . ASCVD (arteriosclerotic cardiovascular disease)     Neg. stress nuc. 3/99; cath in 4/04,-80%LAD; 70% RCA, -> DESx2; residual 60% distal RCA; nl EF  . Hyperkalemia     potassium of 5.1-5.3 in 2011  . Diabetes mellitus     Treated with oral agents; HgbA1c of 7in 08/2008  . Gout   . GERD (gastroesophageal reflux disease)   . Diverticulosis   . Hyperlipidemia   . Chronic kidney disease (CKD), stage III (moderate)     Creat.= 1.78 in 11/2009  . PVD (peripheral vascular disease)      moderate to severely decreased ABI's in 8/08, sig. aortic inflow disease; repeat ABI's in 2011 improved- mild disease on the left and moderate to severe disease on the right  . Cerebrovascular disease      mild; <50% ICA with moderate plaque bilaterally in 12/2009  . Asthmatic bronchitis   . Tobacco abuse     50 pack years continuing at 1/2 pack per day  . CHF (congestive heart failure)   . Arthritis   . Anemia   . Nervousness(799.21)   . Stroke 2012    Mini strokes- Temporary  blindness  . Myocardial infarction   . Coronary disease   . Arterial fibromuscular dysplasia   . Clostridium difficile colitis OCT 2014  . Carotid artery occlusion    Past Surgical History  Procedure Laterality Date  . Cholecystectomy    . Carpel tunnel release      bilateral  . Total abdominal hysterectomy w/ bilateral salpingoophorectomy    .  Colonoscopy  2009  . Abdominal hysterectomy     Allergies  Allergen Reactions  . Ace Inhibitors Hives, Shortness Of Breath and Swelling  . Aspirin Other (See Comments)    Blood in stool and nose bleed  . Codeine Swelling  . Pineapple Shortness Of Breath and Swelling  . Plasticized Base [Plastibase] Hives and Swelling  . Ultram [Tramadol] Swelling  . Adhesive [Tape] Swelling and Rash  . Naprosyn [Naproxen] Swelling  . Clindamycin/Lincomycin Other (See Comments)    Burning sensation throat, abdomen  . Linzess [Linaclotide]     DIARRHEA, FECAL INCONTINENCE  . Nsaids   . Penicillins   . Varenicline Tartrate Swelling   Current Outpatient Prescriptions  Medication Sig Dispense Refill  . albuterol (PROVENTIL HFA;VENTOLIN HFA) 108 (90 BASE) MCG/ACT inhaler Inhale 2 puffs into the lungs every 4 (four) hours as needed for wheezing or shortness of breath.    . ALPRAZolam (XANAX) 0.25 MG tablet Take 1 tablet (0.25 mg total) by mouth at bedtime as needed.    Marland Kitchen aspirin 81 MG tablet Take 81 mg by mouth daily.      . calcium citrate-vitamin D (CITRACAL+D) 315-200 MG-UNIT per tablet Take 1 tablet by mouth daily.      . Cranberry 250 MG CAPS Take 250 mg by mouth daily.    Marland Kitchen  esomeprazole (NEXIUM) 40 MG capsule Take 1 capsule (40 mg total) by mouth daily.    Marland Kitchen ezetimibe (ZETIA) 10 MG tablet Take 1 tablet (10 mg total) by mouth daily.    . pravastatin (PRAVACHOL) 40 MG tablet TAKE 1 TABLET BY MOUTH ONCE DAILY.    . Probiotic Product (PROBIOTIC DAILY PO) Take by mouth daily.    . valsartan (DIOVAN) 160 MG tablet TAKE 1 TABLET BY MOUTH DAILY.    Marland Kitchen EPINEPHrine 0.3 mg/0.3 mL IJ SOAJ injection Inject 0.3 mLs (0.3 mg total) into the muscle once.    . lidocaine-prilocaine (EMLA) cream     .      .       Review of Systems PER HPI OTHERWISE ALL SYSTEMS ARE NEGATIVE.    Objective:   Physical Exam  Constitutional: She is oriented to person, place, and time. She appears well-developed and well-nourished.  No distress.  HENT:  Head: Normocephalic and atraumatic.  Mouth/Throat: Oropharynx is clear and moist. No oropharyngeal exudate.  Eyes: Pupils are equal, round, and reactive to light. No scleral icterus.  Neck: Normal range of motion. Neck supple.  Cardiovascular: Normal rate, regular rhythm and normal heart sounds.   Pulmonary/Chest: Effort normal and breath sounds normal. No respiratory distress.  Abdominal: Soft. Bowel sounds are normal. She exhibits no distension. There is no tenderness.  Musculoskeletal: She exhibits no edema.  Lymphadenopathy:    She has no cervical adenopathy.  Neurological: She is alert and oriented to person, place, and time.  NO FOCAL DEFICITS   Psychiatric: She has a normal mood and affect.  Vitals reviewed.         Assessment & Plan:

## 2015-01-14 NOTE — Patient Instructions (Signed)
USE CREON 12 one or two  with A meal.   FOR CONSTIPATION, BREAK LINZESS(LINACTOLIDE) CAPSULES AND PUT IN 4 TBSP WATER. TAKE 2 TBSP DAILY. IF NOT HAVING A BOWEL MOVEMENT 2 OR 3 TIMES A WEEK THAN TAKE 3 TBSP DAILY. DISCUSSED BENEFITS, AND MANAGEMENT OF CONSTIPATION AND MEDICATION TITRATION.   PLEASE CALL IN ONE MONTH IF YOUR DIARRHEA OR CONSTIPATION IS NOT BETTER.  FOLLOW UP IN 4 MOS.

## 2015-01-14 NOTE — Progress Notes (Signed)
CC'ED TO PCP 

## 2015-01-14 NOTE — Assessment & Plan Note (Signed)
SX EXACERBATED BY PANCREATIC INSUFFICIENCY AND NOT CONTROLLED. PT IMPROVED WITH CREON 36K BUT GOT CONSTIPATED.  USE CREON 12 one or two  with A meal. IF CONSTIPATION OCCURS, BREAK LINZESS CAPSULES AND PUT IN 4 TBSP WATER. TAKE 2 TBSP DAILY. IF NOT HAVING A BOWEL MOVEMENT 2 OR 3 TIMES A WEEK THAN TAKE 3 TBSP DAILY. DISCUSSED BENEFITS, AND MANAGEMENT OF CONSTIPATION AND MEDICATION TITRATION.  FOLLOW UP IN 4 MOS.

## 2015-01-15 NOTE — Progress Notes (Signed)
On recall  °

## 2015-01-18 ENCOUNTER — Other Ambulatory Visit: Payer: Self-pay | Admitting: Family Medicine

## 2015-01-18 NOTE — Telephone Encounter (Signed)
Okay to refill? 

## 2015-01-18 NOTE — Telephone Encounter (Signed)
Ok to refill??  Last office visit 12/30/2014.  Last refill 05/12/2013, #1 refills.

## 2015-01-18 NOTE — Telephone Encounter (Signed)
Medication called to pharmacy. 

## 2015-01-20 ENCOUNTER — Telehealth: Payer: Self-pay | Admitting: Family Medicine

## 2015-01-20 NOTE — Telephone Encounter (Signed)
Pt has appt Dr Bronson Ing 02/03/15  Need [employer] referral.  Referral authorized [phone]  For 6 visits from 02/02/15 - 08/01/15.  Auth faxed back to Cardiology office.

## 2015-02-03 ENCOUNTER — Encounter: Payer: Self-pay | Admitting: Cardiovascular Disease

## 2015-02-03 ENCOUNTER — Ambulatory Visit (INDEPENDENT_AMBULATORY_CARE_PROVIDER_SITE_OTHER): Payer: Commercial Managed Care - HMO | Admitting: Cardiovascular Disease

## 2015-02-03 VITALS — BP 128/66 | HR 53 | Ht 67.0 in | Wt 154.8 lb

## 2015-02-03 DIAGNOSIS — I6523 Occlusion and stenosis of bilateral carotid arteries: Secondary | ICD-10-CM | POA: Diagnosis not present

## 2015-02-03 DIAGNOSIS — R001 Bradycardia, unspecified: Secondary | ICD-10-CM

## 2015-02-03 DIAGNOSIS — I10 Essential (primary) hypertension: Secondary | ICD-10-CM | POA: Diagnosis not present

## 2015-02-03 DIAGNOSIS — Z720 Tobacco use: Secondary | ICD-10-CM

## 2015-02-03 DIAGNOSIS — I2510 Atherosclerotic heart disease of native coronary artery without angina pectoris: Secondary | ICD-10-CM | POA: Diagnosis not present

## 2015-02-03 DIAGNOSIS — I739 Peripheral vascular disease, unspecified: Secondary | ICD-10-CM | POA: Diagnosis not present

## 2015-02-03 DIAGNOSIS — E785 Hyperlipidemia, unspecified: Secondary | ICD-10-CM

## 2015-02-03 NOTE — Progress Notes (Signed)
Patient ID: Charlotte Henry, female   DOB: 1940-09-02, 74 y.o.   MRN: [id number]      SUBJECTIVE: The patient is a 74 year old woman with a history of coronary artery disease and peripheral arterial disease. She underwent drug-eluting stent placement to the LAD and RCA in 2004. She also has a history of diabetes mellitus, tobacco abuse, hypertension, irritable bowel syndrome, GERD, and hyperlipidemia.   Carotid Dopplers in May 2016 showed 1-49% bilateral stenosis.   ABIs in May 2016 showed 0.44 on right and 0.56 on left indicative of severe bilateral arterial occlusive disease, and is followed by vascular surgery.  She denies chest pain, palpitations, leg swelling, and shortness of breath. Has leg pain both at rest and with exertion. Says she is scheduled to see vascular surgery in October.   She continues to smoke 1 ppd.   Review of Systems: As per "subjective", otherwise negative.  Allergies  Allergen Reactions  . Ace Inhibitors Hives, Shortness Of Breath and Swelling  . Aspirin Other (See Comments)    Blood in stool and nose bleed  . Codeine Swelling  . Pineapple Shortness Of Breath and Swelling  . Plasticized Base [Plastibase] Hives and Swelling  . Ultram [Tramadol] Swelling  . Adhesive [Tape] Swelling and Rash  . Naprosyn [Naproxen] Swelling  . Clindamycin/Lincomycin Other (See Comments)    Burning sensation throat, abdomen  . Linzess [Linaclotide]     DIARRHEA, FECAL INCONTINENCE  . Nsaids   . Penicillins   . Varenicline Tartrate Swelling    Current Outpatient Prescriptions  Medication Sig Dispense Refill  . albuterol (PROVENTIL HFA;VENTOLIN HFA) 108 (90 BASE) MCG/ACT inhaler Inhale 2 puffs into the lungs every 4 (four) hours as needed for wheezing or shortness of breath. 1 Inhaler 0  . ALPRAZolam (XANAX) 0.25 MG tablet TAKE ONE TABLET BY MOUTH AT BEDTIME AS NEEDED. 30 tablet 1  . aspirin 81 MG tablet Take 81 mg by mouth daily.      . calcium citrate-vitamin D  (CITRACAL+D) 315-200 MG-UNIT per tablet Take 1 tablet by mouth daily.      . Cranberry 250 MG CAPS Take 250 mg by mouth daily.    Marland Kitchen EPINEPHrine 0.3 mg/0.3 mL IJ SOAJ injection Inject 0.3 mLs (0.3 mg total) into the muscle once. 1 Device 3  . esomeprazole (NEXIUM) 40 MG capsule Take 1 capsule (40 mg total) by mouth daily. 30 capsule 3  . ezetimibe (ZETIA) 10 MG tablet Take 1 tablet (10 mg total) by mouth daily. 90 tablet 3  . lidocaine-prilocaine (EMLA) cream     . lipase/protease/amylase (CREON) 12000 UNITS CPEP capsule 1 with meals and snacks. 270 capsule 5  . mirabegron ER (MYRBETRIQ) 25 MG TB24 tablet Take 1 tablet (25 mg total) by mouth at bedtime. For bladder 30 tablet 6  . pravastatin (PRAVACHOL) 40 MG tablet TAKE 1 TABLET BY MOUTH ONCE DAILY. 90 tablet 3  . Probiotic Product (PROBIOTIC DAILY PO) Take by mouth daily.    . valsartan (DIOVAN) 160 MG tablet TAKE 1 TABLET BY MOUTH DAILY. 90 tablet 0   No current facility-administered medications for this visit.    Past Medical History  Diagnosis Date  . Hypertension   . ASCVD (arteriosclerotic cardiovascular disease)     Neg. stress nuc. 3/99; cath in 4/04,-80%LAD; 70% RCA, -> DESx2; residual 60% distal RCA; nl EF  . Hyperkalemia     potassium of 5.1-5.3 in 2011  . Diabetes mellitus     Treated with oral agents; HgbA1c  of 7in 08/2008  . Gout   . GERD (gastroesophageal reflux disease)   . Diverticulosis   . Hyperlipidemia   . Chronic kidney disease (CKD), stage III (moderate)     Creat.= 1.78 in 11/2009  . PVD (peripheral vascular disease)      moderate to severely decreased ABI's in 8/08, sig. aortic inflow disease; repeat ABI's in 2011 improved- mild disease on the left and moderate to severe disease on the right  . Cerebrovascular disease      mild; <50% ICA with moderate plaque bilaterally in 12/2009  . Asthmatic bronchitis   . Tobacco abuse     50 pack years continuing at 1/2 pack per day  . CHF (congestive heart failure)   .  Arthritis   . Anemia   . Nervousness(799.21)   . Stroke 2012    Mini strokes- Temporary  blindness  . Myocardial infarction   . Coronary disease   . Arterial fibromuscular dysplasia   . Clostridium difficile colitis OCT 2014  . Carotid artery occlusion     Past Surgical History  Procedure Laterality Date  . Cholecystectomy    . Carpel tunnel release      bilateral  . Total abdominal hysterectomy w/ bilateral salpingoophorectomy    . Colonoscopy  2009  . Abdominal hysterectomy      Social History   Social History  . Marital Status: Widowed    Spouse Name: N/A  . Number of Children: N/A  . Years of Education: N/A   Occupational History  . Not on file.   Social History Main Topics  . Smoking status: Current Every Day Smoker -- 0.50 packs/day for 50 years    Types: Cigarettes    Start date: 02/02/1966  . Smokeless tobacco: Never Used     Comment: 1/2 pack daily  . Alcohol Use: No  . Drug Use: No  . Sexual Activity: Not on file   Other Topics Concern  . Not on file   Social History Narrative     Filed Vitals:   02/03/15 0938  BP: 128/66  Pulse: 53  Height: '5\' 7"'$  (1.702 m)  Weight: 154 lb 12.8 oz (70.217 kg)  SpO2: 99%    PHYSICAL EXAM General: NAD  Neck: No JVD, no thyromegaly.  Lungs: Diminished without rales or wheezes. CV: Nondisplaced PMI. Bradycardic, regular rhythm, normal S1/S2, no E3/X5, I/VI systolic murmur along left sternal border. No pretibial or periankle edema.  Abdomen: Soft, nontender, no distention.  Neurologic: Alert and oriented x 3.  Psych: Somewhat flat affect. Skin: Normal. Musculoskeletal: No gross deformities. Extremities: No clubbing or cyanosis.   ECG: Most recent ECG reviewed.      ASSESSMENT AND PLAN: 1. CAD: Stable ischemic heart disease on current medical therapy. No indication for noninvasive testing at this time. Continue aspirin, Zetia, and pravastatin.   2. Essential HTN: Well controlled on current  therapy. Continue current dose of valsartan.   3. Hyperlipidemia: Lipid panel on 07/01/14 showed HDL 45, LDL 53. No change in current therapy which includes Zetia and pravastatin.   4. Bradycardia and fatigue: Stable at this time. If symptoms recur, would consider Holter monitoring and stress testing. Not on AV nodal blocking agents.  5. Peripheral vascular disease: Noninvasive testing results as noted above. Followed by vascular surgery. Has rest and exertional claudication.  6. Tobacco abuse: Extensive cessation counseling previously provided, but I doubt her willingness to quit.  Dispo: f/u 1 year.  Kate Sable, M.D., F.A.C.C.

## 2015-02-03 NOTE — Patient Instructions (Signed)
Your physician wants you to follow-up in:  1 YEAR WITH DR. KONESWARAN. You will receive a reminder letter in the mail two months in advance. If you don't receive a letter, please call our office to schedule the follow-up appointment.   Your physician recommends that you continue on your current medications as directed. Please refer to the Current Medication list given to you today.  Thanks for choosing [facility]!!!   

## 2015-02-19 ENCOUNTER — Other Ambulatory Visit: Payer: Self-pay | Admitting: Family Medicine

## 2015-02-19 NOTE — Telephone Encounter (Signed)
Should have one refill left, confirmed with pharmacy.  Refill denied

## 2015-03-11 ENCOUNTER — Ambulatory Visit: Payer: Commercial Managed Care - HMO

## 2015-04-07 ENCOUNTER — Other Ambulatory Visit: Payer: Self-pay | Admitting: Family Medicine

## 2015-04-07 DIAGNOSIS — Z1231 Encounter for screening mammogram for malignant neoplasm of breast: Secondary | ICD-10-CM

## 2015-04-17 ENCOUNTER — Other Ambulatory Visit: Payer: Self-pay | Admitting: Family Medicine

## 2015-04-19 ENCOUNTER — Encounter: Payer: Self-pay | Admitting: Gastroenterology

## 2015-04-19 NOTE — Telephone Encounter (Signed)
Okay to refill? 

## 2015-04-19 NOTE — Telephone Encounter (Signed)
Ok to refill Xanax??  Last office visit 12/30/2014.  Last refill 01/18/2015, #1 refills.

## 2015-04-20 NOTE — Telephone Encounter (Signed)
Medication called to pharmacy. 

## 2015-05-03 ENCOUNTER — Ambulatory Visit (HOSPITAL_COMMUNITY)
Admission: RE | Admit: 2015-05-03 | Discharge: 2015-05-03 | Disposition: A | Discharge status: Home / Self Care | Payer: Commercial Managed Care - HMO | Source: Ambulatory Visit | Attending: Family Medicine | Admitting: Family Medicine

## 2015-05-03 DIAGNOSIS — Z1231 Encounter for screening mammogram for malignant neoplasm of breast: Secondary | ICD-10-CM | POA: Diagnosis not present

## 2015-05-12 ENCOUNTER — Other Ambulatory Visit: Payer: Self-pay | Admitting: Cardiovascular Disease

## 2015-05-26 ENCOUNTER — Encounter: Payer: Self-pay | Admitting: Gastroenterology

## 2015-05-26 ENCOUNTER — Ambulatory Visit (INDEPENDENT_AMBULATORY_CARE_PROVIDER_SITE_OTHER): Payer: Commercial Managed Care - HMO | Admitting: Gastroenterology

## 2015-05-26 VITALS — BP 122/69 | HR 57 | Temp 97.0°F | Ht 67.0 in | Wt 156.2 lb

## 2015-05-26 DIAGNOSIS — K589 Irritable bowel syndrome without diarrhea: Secondary | ICD-10-CM | POA: Diagnosis not present

## 2015-05-26 DIAGNOSIS — K219 Gastro-esophageal reflux disease without esophagitis: Secondary | ICD-10-CM

## 2015-05-26 NOTE — Progress Notes (Signed)
ON RECALL  °

## 2015-05-26 NOTE — Assessment & Plan Note (Signed)
SYMPTOMS CONTROLLED/RESOLVED.  CONTINUE NEXIUM. TAKE 30 MINUTES BEFORE first MEAL. FOLLOW UP IN 6-12 MOS.

## 2015-05-26 NOTE — Progress Notes (Signed)
Subjective:    Patient ID: Charlotte Henry, female    DOB: 10/19/1940, 74 y.o.   MRN: [id number]  [name], MD  HPI REGULATES BOWEL WITH PRN CREON. FEELS LIKE NEXIUM HELPS REGULATE BOWELS. SISTER RECOVERING FROM BRAIN SURGERY. BMs: 2-3X/WEEK. HAS PROBLEM WITH FEET BURNING.  PT DENIES FEVER, CHILLS, HEMATOCHEZIA, HEMATEMESIS, nausea, vomiting, melena, diarrhea, CHEST PAIN, SHORTNESS OF BREATH, CHANGE IN BOWEL IN HABITS, constipation, abdominal pain, problems swallowing, ORheartburn or indigestion.  Past Medical History  Diagnosis Date  . Hypertension   . ASCVD (arteriosclerotic cardiovascular disease)     Neg. stress nuc. 3/99; cath in 4/04,-80%LAD; 70% RCA, -> DESx2; residual 60% distal RCA; nl EF  . Hyperkalemia     potassium of 5.1-5.3 in 2011  . Diabetes mellitus     Treated with oral agents; HgbA1c of 7in 08/2008  . Gout   . GERD (gastroesophageal reflux disease)   . Diverticulosis   . Hyperlipidemia   . Chronic kidney disease (CKD), stage III (moderate)     Creat.= 1.78 in 11/2009  . PVD (peripheral vascular disease)      moderate to severely decreased ABI's in 8/08, sig. aortic inflow disease; repeat ABI's in 2011 improved- mild disease on the left and moderate to severe disease on the right  . Cerebrovascular disease      mild; <50% ICA with moderate plaque bilaterally in 12/2009  . Asthmatic bronchitis   . Tobacco abuse     50 pack years continuing at 1/2 pack per day  . CHF (congestive heart failure)   . Arthritis   . Anemia   . Nervousness(799.21)   . Stroke 2012    Mini strokes- Temporary  blindness  . Myocardial infarction ([place])   . Coronary disease   . Arterial fibromuscular dysplasia   . Clostridium difficile colitis OCT 2014  . Carotid artery occlusion    Past Surgical History  Procedure Laterality Date  . Cholecystectomy    . Carpel tunnel release      bilateral  . Total abdominal hysterectomy w/ bilateral salpingoophorectomy    . Colonoscopy   2009  . Abdominal hysterectomy     Allergies  Allergen Reactions  . Ace Inhibitors Hives, Shortness Of Breath and Swelling  . Aspirin Other (See Comments)    Blood in stool and nose bleed  . Codeine Swelling  . Pineapple Shortness Of Breath and Swelling  . Plasticized Base [Plastibase] Hives and Swelling  . Ultram [Tramadol] Swelling  . Adhesive [Tape] Swelling and Rash  . Naprosyn [Naproxen] Swelling  . Clindamycin/Lincomycin Other (See Comments)    Burning sensation throat, abdomen  . Linzess [Linaclotide]     DIARRHEA, FECAL INCONTINENCE  . Nsaids   . Penicillins   . Varenicline Tartrate Swelling   Current Outpatient Prescriptions  Medication Sig Dispense Refill  . albuterol (PROVENTIL HFA;VENTOLIN HFA) 108 (90 BASE) MCG/ACT inhaler Inhale 2 puffs into the lungs every 4 (four) hours as needed for wheezing or shortness of breath.    . ALPRAZolam (XANAX) 0.25 MG tablet TAKE 1 TABLET BY MOUTH AT BEDTIME AS NEEDED.    Marland Kitchen aspirin 81 MG tablet Take 81 mg by mouth daily.      . calcium citrate-vitamin D (CITRACAL+D) 315-200 MG-UNIT per tablet Take 1 tablet by mouth daily.      . Cranberry 250 MG CAPS Take 250 mg by mouth daily.    Marland Kitchen EPINEPHrine 0.3 mg/0.3 mL IJ SOAJ injection Inject 0.3 mLs (0.3 mg total) into  the muscle once.    Marland Kitchen esomeprazole (NEXIUM) 40 MG capsule Take 1 capsule (40 mg total) by mouth daily.    Marland Kitchen ezetimibe (ZETIA) 10 MG tablet Take 1 tablet (10 mg total) by mouth daily.    Marland Kitchen lidocaine-prilocaine (EMLA) cream     . lipase/protease/amylase (CREON) 12000 UNITS CPEP capsule 1 with meals and snacks.    . mirabegron ER (MYRBETRIQ) 25 MG TB24 tablet Take 1 tablet (25 mg total) by mouth at bedtime. For bladder    . pravastatin (PRAVACHOL) 40 MG tablet TAKE 1 TABLET BY MOUTH ONCE DAILY.    . Probiotic Product (PROBIOTIC DAILY PO) Take by mouth daily.    . valsartan (DIOVAN) 160 MG tablet TAKE 1 TABLET BY MOUTH DAILY.     Review of Systems PER HPI OTHERWISE ALL SYSTEMS  ARE NEGATIVE.    Objective:   Physical Exam  Constitutional: She is oriented to person, place, and time. She appears well-developed and well-nourished. No distress.  HENT:  Head: Normocephalic and atraumatic.  Mouth/Throat: Oropharynx is clear and moist. No oropharyngeal exudate.  Eyes: Pupils are equal, round, and reactive to light. No scleral icterus.  Neck: Normal range of motion. Neck supple.  Cardiovascular: Normal rate, regular rhythm and normal heart sounds.   Pulmonary/Chest: Effort normal and breath sounds normal. No respiratory distress.  Abdominal: Soft. Bowel sounds are normal. She exhibits no distension. There is no tenderness.  Musculoskeletal: She exhibits no edema.  Lymphadenopathy:    She has no cervical adenopathy.  Neurological: She is alert and oriented to person, place, and time.  NO FOCAL DEFICITS  Psychiatric: She has a normal mood and affect.  Vitals reviewed.     Assessment & Plan:

## 2015-05-26 NOTE — Progress Notes (Signed)
CC'ED TO PCP 

## 2015-05-26 NOTE — Assessment & Plan Note (Addendum)
Mixed symptoms AND POSSIBLY EXACERBATED BY PANCREATIC INSUFFICIENCY. SYMPTOMS FAIRLY WELL CONTROLLED WITH PRN CREON.  DRINK WATER EAT FIBER CREON PRN CALL WITH QUESTIONS OR CONCERNS. FOLLOW UP IN 6-12 MOS.

## 2015-05-26 NOTE — Patient Instructions (Signed)
DRINK WATER TO KEEP YOUR URINE LIGHT YELLOW.  FOLLOW A HIGH FIBER DIET. AVOID ITEMS THAT CAUSE BLOATING & GAS. SEE INFO BELOW.  USE CREON AS NEEDED TO CONTROL DIARRHEA.  FOLLOW UP IN 6 TO 12 MOS.   High-Fiber Diet A high-fiber diet changes your normal diet to include more whole grains, legumes, fruits, and vegetables. Changes in the diet involve replacing refined carbohydrates with unrefined foods. The calorie level of the diet is essentially unchanged. The Dietary Reference Intake (recommended amount) for adult males is 38 grams per day. For adult females, it is 25 grams per day. Pregnant and lactating women should consume 28 grams of fiber per day. Fiber is the intact part of a plant that is not broken down during digestion. Functional fiber is fiber that has been isolated from the plant to provide a beneficial effect in the body. PURPOSE  Increase stool bulk.   Ease and regulate bowel movements.   Lower cholesterol.  REDUCE RISK OF COLON CANCER  INDICATIONS THAT YOU NEED MORE FIBER  Constipation and hemorrhoids.   Uncomplicated diverticulosis (intestine condition) and irritable bowel syndrome.   Weight management.   As a protective measure against hardening of the arteries (atherosclerosis), diabetes, and cancer.   GUIDELINES FOR INCREASING FIBER IN THE DIET  Start adding fiber to the diet slowly. A gradual increase of about 5 more grams (2 slices of whole-wheat bread, 2 servings of most fruits or vegetables, or 1 bowl of high-fiber cereal) per day is best. Too rapid an increase in fiber may result in constipation, flatulence, and bloating.   Drink enough water and fluids to keep your urine clear or pale yellow. Water, juice, or caffeine-free drinks are recommended. Not drinking enough fluid may cause constipation.   Eat a variety of high-fiber foods rather than one type of fiber.   Try to increase your intake of fiber through using high-fiber foods rather than fiber pills or  supplements that contain small amounts of fiber.   The goal is to change the types of food eaten. Do not supplement your present diet with high-fiber foods, but replace foods in your present diet.  INCLUDE A VARIETY OF FIBER SOURCES  Replace refined and processed grains with whole grains, canned fruits with fresh fruits, and incorporate other fiber sources. White rice, white breads, and most bakery goods contain little or no fiber.   Brown whole-grain rice, buckwheat oats, and many fruits and vegetables are all good sources of fiber. These include: broccoli, Brussels sprouts, cabbage, cauliflower, beets, sweet potatoes, white potatoes (skin on), carrots, tomatoes, eggplant, squash, berries, fresh fruits, and dried fruits.   Cereals appear to be the richest source of fiber. Cereal fiber is found in whole grains and bran. Bran is the fiber-rich outer coat of cereal grain, which is largely removed in refining. In whole-grain cereals, the bran remains. In breakfast cereals, the largest amount of fiber is found in those with "bran" in their names. The fiber content is sometimes indicated on the label.   You may need to include additional fruits and vegetables each day.   In baking, for 1 cup white flour, you may use the following substitutions:   1 cup whole-wheat flour minus 2 tablespoons.   1/2 cup white flour plus 1/2 cup whole-wheat flour.

## 2015-06-09 ENCOUNTER — Telehealth: Payer: Self-pay | Admitting: *Deleted

## 2015-06-09 NOTE — Telephone Encounter (Signed)
Submitted [employer] referral thru [employer] [facility] for authorization on 05/25/15 to Dr. [place] with authorizatoin [id number]  Requesting provider: Lonell Grandchild [place],MD  Treating provider: [place]  Number of visits:6  Start Date: 05/26/15  End Date:11/22/15  Dx:K58.9- Irritable Bowel Syndrome without diarrhea

## 2015-07-16 ENCOUNTER — Other Ambulatory Visit: Payer: Self-pay | Admitting: Family Medicine

## 2015-07-16 NOTE — Telephone Encounter (Signed)
Refill appropriate and filled per protocol. 

## 2015-07-23 ENCOUNTER — Ambulatory Visit (INDEPENDENT_AMBULATORY_CARE_PROVIDER_SITE_OTHER): Payer: Commercial Managed Care - HMO | Admitting: Family Medicine

## 2015-07-23 ENCOUNTER — Encounter: Payer: Self-pay | Admitting: Family Medicine

## 2015-07-23 VITALS — BP 138/72 | HR 58 | Temp 98.2°F | Resp 18 | Wt 150.0 lb

## 2015-07-23 DIAGNOSIS — E785 Hyperlipidemia, unspecified: Secondary | ICD-10-CM | POA: Diagnosis not present

## 2015-07-23 DIAGNOSIS — E1159 Type 2 diabetes mellitus with other circulatory complications: Secondary | ICD-10-CM | POA: Diagnosis not present

## 2015-07-23 DIAGNOSIS — N3281 Overactive bladder: Secondary | ICD-10-CM

## 2015-07-23 DIAGNOSIS — R300 Dysuria: Secondary | ICD-10-CM | POA: Diagnosis not present

## 2015-07-23 DIAGNOSIS — I10 Essential (primary) hypertension: Secondary | ICD-10-CM | POA: Diagnosis not present

## 2015-07-23 DIAGNOSIS — J209 Acute bronchitis, unspecified: Secondary | ICD-10-CM | POA: Diagnosis not present

## 2015-07-23 LAB — URINALYSIS, ROUTINE W REFLEX MICROSCOPIC
Bilirubin Urine: NEGATIVE
Glucose, UA: NEGATIVE
HGB URINE DIPSTICK: NEGATIVE
KETONES UR: NEGATIVE
Leukocytes, UA: NEGATIVE
NITRITE: NEGATIVE
PH: 5.5 (ref 5.0–8.0)
PROTEIN: NEGATIVE
Specific Gravity, Urine: 1.025 (ref 1.001–1.035)

## 2015-07-23 LAB — CBC WITH DIFFERENTIAL/PLATELET
BASOS ABS: 0 10*3/uL (ref 0.0–0.1)
BASOS PCT: 0 % (ref 0–1)
EOS PCT: 3 % (ref 0–5)
Eosinophils Absolute: 0.2 10*3/uL (ref 0.0–0.7)
HCT: 38.2 % (ref 36.0–46.0)
Hemoglobin: 12.3 g/dL (ref 12.0–15.0)
Lymphocytes Relative: 46 % (ref 12–46)
Lymphs Abs: 3 10*3/uL (ref 0.7–4.0)
MCH: 29.7 pg (ref 26.0–34.0)
MCHC: 32.2 g/dL (ref 30.0–36.0)
MCV: 92.3 fL (ref 78.0–100.0)
MONO ABS: 0.5 10*3/uL (ref 0.1–1.0)
MPV: 9.3 fL (ref 8.6–12.4)
Monocytes Relative: 8 % (ref 3–12)
NEUTROS ABS: 2.8 10*3/uL (ref 1.7–7.7)
Neutrophils Relative %: 43 % (ref 43–77)
PLATELETS: 261 10*3/uL (ref 150–400)
RBC: 4.14 MIL/uL (ref 3.87–5.11)
RDW: 13.7 % (ref 11.5–15.5)
WBC: 6.6 10*3/uL (ref 4.0–10.5)

## 2015-07-23 LAB — HEMOGLOBIN A1C
Hgb A1c MFr Bld: 6.3 % — ABNORMAL HIGH (ref <5.7)
Mean Plasma Glucose: 134 mg/dL — ABNORMAL HIGH (ref <117)

## 2015-07-23 MED ORDER — AZITHROMYCIN 250 MG PO TABS: ORAL_TABLET | ORAL | Status: DC

## 2015-07-23 MED ORDER — ALBUTEROL SULFATE HFA 108 (90 BASE) MCG/ACT IN AERS: 2.0000 | INHALATION_SPRAY | RESPIRATORY_TRACT | Status: DC | PRN

## 2015-07-23 MED ORDER — MIRABEGRON ER 25 MG PO TB24: 25.0000 mg | ORAL_TABLET | Freq: Every day | ORAL | Status: DC

## 2015-07-23 MED ORDER — BENZONATATE 100 MG PO CAPS: 100.0000 mg | ORAL_CAPSULE | Freq: Two times a day (BID) | ORAL | Status: DC | PRN

## 2015-07-23 NOTE — Progress Notes (Signed)
Patient ID: Charlotte Henry, female   DOB: 1941-02-14, 75 y.o.   MRN: [id number]   Subjective:    Patient ID: Charlotte Henry, female    DOB: February 05, 1941, 75 y.o.   MRN: [id number]  Patient presents for Follow-up  patient here for follow-up. . She's been sick for the past week states that she was running some fever with some chills. In the bed all week. She now has a really bad cough with production. She has not be all inhaler but has not been using it correctly. She still gets thick mucus up. She try over-the-counter cough medicines and Benadryl   Overactive bladder she's been on the Myrbetriq  Which helps she was concerned about side effects of being on this long-term   DM- her last A1c was 6.3% this is diet-controlled. She is due for repeat labs     Review Of Systems:  GEN- + fatigue, fever, weight loss,weakness, recent illness HEENT- denies eye drainage, change in vision, nasal discharge, CVS- denies chest pain, palpitations RESP- + SOB, +cough, +wheeze ABD- denies N/V, change in stools, abd pain GU- denies dysuria, hematuria, dribbling, incontinence MSK- denies joint pain, muscle aches, injury Neuro- denies headache, dizziness, syncope, seizure activity       Objective:    BP 138/72 mmHg  Pulse 58  Temp(Src) 98.2 F (36.8 C)  Resp 18  Wt 150 lb (68.04 kg) GEN- NAD, alert and oriented x3 HEENT- PERRL, EOMI, non injected sclera, pink conjunctiva, MMM, oropharynx clear Neck- Supple, no LAD CVS- RRR, no murmur RESP-occasional wheeze, upper airway congestion, no rales, normal WOB, normal sat EXT- No edema Pulses- Radial- 2+        Assessment & Plan:      Problem List Items Addressed This Visit    Overactive bladder    Continue myrbetriq , little SE in elderly patients       Relevant Orders   Urinalysis, Routine w reflex microscopic (not at [facility]) (Completed)   Urine culture (Completed)   Hyperlipidemia   Relevant Orders   Lipid panel (Completed)   Essential  hypertension - Primary    Well controlled, no changes to meds      Relevant Orders   CBC with Differential/Platelet (Completed)   Comprehensive metabolic panel (Completed)   Diabetes mellitus (HCC)    Diet controlled, recheck A1C       Relevant Orders   Hemoglobin A1c (Completed)    Other Visit Diagnoses    Acute bronchitis, unspecified organism        treat with zpak, albuterol, tessalon perrles       Note: This dictation was prepared with Dragon dictation along with smaller phrase technology. Any transcriptional errors that result from this process are unintentional.

## 2015-07-23 NOTE — Patient Instructions (Signed)
Drink fluids We will call with lab results  Take antibiotics as prescribed F/U 6 months

## 2015-07-24 LAB — COMPREHENSIVE METABOLIC PANEL
ALK PHOS: 52 U/L (ref 33–130)
ALT: 12 U/L (ref 6–29)
AST: 21 U/L (ref 10–35)
Albumin: 4.1 g/dL (ref 3.6–5.1)
BUN: 17 mg/dL (ref 7–25)
CO2: 27 mmol/L (ref 20–31)
CREATININE: 1.05 mg/dL — AB (ref 0.60–0.93)
Calcium: 9.7 mg/dL (ref 8.6–10.4)
Chloride: 104 mmol/L (ref 98–110)
GLUCOSE: 102 mg/dL — AB (ref 70–99)
Potassium: 4.2 mmol/L (ref 3.5–5.3)
SODIUM: 139 mmol/L (ref 135–146)
Total Bilirubin: 0.4 mg/dL (ref 0.2–1.2)
Total Protein: 7 g/dL (ref 6.1–8.1)

## 2015-07-24 LAB — LIPID PANEL
CHOL/HDL RATIO: 2.6 ratio (ref <=5.0)
CHOLESTEROL: 124 mg/dL — AB (ref 125–200)
HDL: 47 mg/dL (ref >=46)
LDL CALC: 58 mg/dL (ref <130)
Triglycerides: 93 mg/dL (ref <150)
VLDL: 19 mg/dL (ref <30)

## 2015-07-25 LAB — URINE CULTURE
Colony Count: NO GROWTH
ORGANISM ID, BACTERIA: NO GROWTH

## 2015-07-25 NOTE — Assessment & Plan Note (Signed)
Continue myrbetriq , little SE in elderly patients

## 2015-07-25 NOTE — Assessment & Plan Note (Signed)
Diet controlled, recheck A1C

## 2015-07-25 NOTE — Assessment & Plan Note (Signed)
Well controlled, no changes to meds

## 2015-07-30 ENCOUNTER — Telehealth: Payer: Self-pay | Admitting: Family Medicine

## 2015-07-30 MED ORDER — NICOTINE 21 MG/24HR TD PT24: 21.0000 mg | MEDICATED_PATCH | Freq: Every day | TRANSDERMAL | Status: DC

## 2015-07-30 NOTE — Telephone Encounter (Signed)
[facility]  PATIENT WOULD LIKE A PRESCRIPTION FOR [place] GUM  [phone] (H)

## 2015-07-30 NOTE — Telephone Encounter (Signed)
Call placed to patient.   Patient made aware of MD recommendations.   Prescription sent to pharmacy for Nicoderm '21mg'$ / day patches.

## 2015-07-30 NOTE — Telephone Encounter (Signed)
That is not a prescripation I am aware of, it is over the counter She can get a script for patches

## 2015-07-30 NOTE — Telephone Encounter (Signed)
Ok to order 

## 2015-08-16 ENCOUNTER — Other Ambulatory Visit: Payer: Self-pay | Admitting: Family Medicine

## 2015-08-16 NOTE — Telephone Encounter (Signed)
Refill appropriate and filled per protocol. 

## 2015-09-24 ENCOUNTER — Encounter: Payer: Self-pay | Admitting: Family Medicine

## 2015-09-24 ENCOUNTER — Ambulatory Visit (INDEPENDENT_AMBULATORY_CARE_PROVIDER_SITE_OTHER): Payer: Commercial Managed Care - HMO | Admitting: Family Medicine

## 2015-09-24 VITALS — BP 138/76 | HR 82 | Temp 97.9°F | Resp 16 | Wt 154.0 lb

## 2015-09-24 DIAGNOSIS — M5136 Other intervertebral disc degeneration, lumbar region: Secondary | ICD-10-CM

## 2015-09-24 DIAGNOSIS — J4521 Mild intermittent asthma with (acute) exacerbation: Secondary | ICD-10-CM

## 2015-09-24 DIAGNOSIS — N390 Urinary tract infection, site not specified: Secondary | ICD-10-CM | POA: Diagnosis not present

## 2015-09-24 LAB — URINALYSIS, MICROSCOPIC ONLY
Bacteria, UA: NONE SEEN [HPF]
CRYSTALS: NONE SEEN [HPF]
Casts: NONE SEEN [LPF]
RBC / HPF: NONE SEEN RBC/HPF (ref <=2)
Yeast: NONE SEEN [HPF]

## 2015-09-24 LAB — URINALYSIS, ROUTINE W REFLEX MICROSCOPIC
BILIRUBIN URINE: NEGATIVE
GLUCOSE, UA: NEGATIVE
Hgb urine dipstick: NEGATIVE
Ketones, ur: NEGATIVE
Nitrite: NEGATIVE
Protein, ur: NEGATIVE
SPECIFIC GRAVITY, URINE: 1.015 (ref 1.001–1.035)
pH: 6 (ref 5.0–8.0)

## 2015-09-24 MED ORDER — NICOTINE 21 MG/24HR TD PT24: 21.0000 mg | MEDICATED_PATCH | Freq: Every day | TRANSDERMAL | Status: DC

## 2015-09-24 MED ORDER — TIOTROPIUM BROMIDE MONOHYDRATE 1.25 MCG/ACT IN AERS: 2.0000 | INHALATION_SPRAY | Freq: Every day | RESPIRATORY_TRACT | Status: DC

## 2015-09-24 MED ORDER — CEPHALEXIN 500 MG PO CAPS: 500.0000 mg | ORAL_CAPSULE | Freq: Three times a day (TID) | ORAL | Status: DC

## 2015-09-24 NOTE — Patient Instructions (Addendum)
Pulomonary function test to be done Use the spiriva 2 puffs in the morning Take antibiotics Use albuteorl as needed Take Tylenol and lidoderm patch for your back Change F/U to June

## 2015-09-24 NOTE — Assessment & Plan Note (Signed)
Chest x-ray back in January shows COPD findings. She has known asthma bronchitis. Continue cough with occasional wheeze. She did have bronchitis a month or so ago. Didn't get her set up for pulmonary function tests. I've given her Spiriva respite mat 1.252 puffs daily to try. She will use her albuterol only as a rescue inhaler.

## 2015-09-24 NOTE — Progress Notes (Signed)
Patient ID: Charlotte Henry, female   DOB: Jul 14, 1940, 75 y.o.   MRN: [id number]    Subjective:    Patient ID: Charlotte Henry, female    DOB: [birth year], 75 y.o.   MRN: [id number]  Patient presents for Dysuria Patient here with dysuria or urinary frequency for the past couple days. She also has low back pain mostly on the left lower side also has back pain in the lumbar region. She has known degenerative disease. She denies any fever no nausea vomiting she did hasn't diarrhea last week after drinking the milk shake she took her lactate but it was not enough. That resolved after taking some Imodium. She also continues to have cough she was treated for bronchitis last month. She continues to smoke. She has COPD changes on previous x-rays. She does use the albuterol as needed but states she saw a commercial the says that it was poison therefore she has not used recently. She does not have any significant problems when she is moving about denies any shortness of breath or chest pain. For her back pain she did try using some Lidoderm patches to couple months ago. And she is using some Tylenol which helps some.   Review Of Systems:  GEN- denies fatigue, fever, weight loss,weakness, recent illness HEENT- denies eye drainage, change in vision, nasal discharge, CVS- denies chest pain, palpitations RESP- denies SOB+, cough, wheeze ABD- denies N/V, change in stools, abd pain GU- + dysuria, hematuria, dribbling, incontinence MSK-+ joint pain, muscle aches, injury Neuro- denies headache, dizziness, syncope, seizure activity         Objective:    BP 138/76 mmHg  Pulse 82  Temp(Src) 97.9 F (36.6 C)  Resp 16  Wt 154 lb (69.854 kg)  SpO2 98% GEN- NAD, alert and oriented x3 HEENT- PERRL, EOMI, non injected sclera, pink conjunctiva, MMM, oropharynx clear Neck- Supple, no thyromegaly CVS- RRR, no murmur RESP-CTAB ABD-NABS,soft, mild TTP left flank, no rebound, no guarding, ND,mild suprapubic  tenderness  MSK- Mild TTP lumbar spine, fair ROM, neg SLR, Neuro- strength equal bilat, normal sensation, normal tone  EXT- No edema Pulses- Radial, DP- 2+        Assessment & Plan:      Problem List Items Addressed This Visit    Asthmatic bronchitis    Chest x-ray back in January shows COPD findings. She has known asthma bronchitis. Continue cough with occasional wheeze. She did have bronchitis a month or so ago. Didn't get her set up for pulmonary function tests. I've given her Spiriva respite mat 1.252 puffs daily to try. She will use her albuterol only as a rescue inhaler.      Relevant Medications   Tiotropium Bromide Monohydrate (SPIRIVA RESPIMAT) 1.25 MCG/ACT AERS    Other Visit Diagnoses    UTI (lower urinary tract infection)    -  Primary    She has  symptomatic UTI however not much white cells 1 microscopy. I'm going to go ahead and start Keflex culture has been sent.    Relevant Medications    cephALEXin (KEFLEX) 500 MG capsule    Other Relevant Orders    Urinalysis, Routine w reflex microscopic (not at [facility]) (Completed)    Urine culture    DDD (degenerative disc disease), lumbar        She has known degenerative changes. I think this may be causing some of her back pain as well. Advised to use Tylenol in the Lidoderm patches. No red flags  Note: This dictation was prepared with Dragon dictation along with smaller phrase technology. Any transcriptional errors that result from this process are unintentional.

## 2015-09-26 LAB — URINE CULTURE: Colony Count: 25000

## 2015-09-27 ENCOUNTER — Telehealth: Payer: Self-pay | Admitting: Family Medicine

## 2015-09-27 NOTE — Telephone Encounter (Signed)
Schedule patient for visit this week for recheck

## 2015-09-27 NOTE — Telephone Encounter (Signed)
Call placed to patient and patient made aware.   Appointment scheduled.  

## 2015-09-27 NOTE — Telephone Encounter (Signed)
Call placed to patient.   Reports that she continues to have increased back pain and urinary frequency. Reports that frequency is worse than when she was seen on Friday.   MD please advise.

## 2015-09-27 NOTE — Telephone Encounter (Signed)
Patients uti is not any better would like a call back as to what she should do  [phone]

## 2015-09-29 ENCOUNTER — Ambulatory Visit (INDEPENDENT_AMBULATORY_CARE_PROVIDER_SITE_OTHER): Payer: Commercial Managed Care - HMO | Admitting: Family Medicine

## 2015-09-29 ENCOUNTER — Encounter: Payer: Self-pay | Admitting: Family Medicine

## 2015-09-29 VITALS — BP 122/68 | HR 82 | Temp 98.3°F | Resp 16 | Ht 67.0 in | Wt 154.0 lb

## 2015-09-29 DIAGNOSIS — R109 Unspecified abdominal pain: Secondary | ICD-10-CM

## 2015-09-29 DIAGNOSIS — R300 Dysuria: Secondary | ICD-10-CM | POA: Diagnosis not present

## 2015-09-29 DIAGNOSIS — K589 Irritable bowel syndrome without diarrhea: Secondary | ICD-10-CM

## 2015-09-29 LAB — URINALYSIS, MICROSCOPIC ONLY
Bacteria, UA: NONE SEEN [HPF]
CASTS: NONE SEEN [LPF]
Crystals: NONE SEEN [HPF]
WBC UA: NONE SEEN WBC/HPF (ref <=5)
Yeast: NONE SEEN [HPF]

## 2015-09-29 LAB — URINALYSIS, ROUTINE W REFLEX MICROSCOPIC
Bilirubin Urine: NEGATIVE
Glucose, UA: NEGATIVE
KETONES UR: NEGATIVE
NITRITE: NEGATIVE
PROTEIN: NEGATIVE
Specific Gravity, Urine: 1.015 (ref 1.001–1.035)
pH: 6 (ref 5.0–8.0)

## 2015-09-29 NOTE — Patient Instructions (Addendum)
Take the creon take 1 capsule twice a day  Call if this does not help Okay to use miralax as wel F/U pending results

## 2015-09-30 ENCOUNTER — Encounter: Payer: Self-pay | Admitting: Family Medicine

## 2015-09-30 LAB — URINE CULTURE
COLONY COUNT: NO GROWTH
ORGANISM ID, BACTERIA: NO GROWTH

## 2015-09-30 NOTE — Assessment & Plan Note (Signed)
Known IBS C, now ? If pain related to constipation I recommended imaging, pt declines She wants to try restarting her Creon first per GI recommendations UC to be rechecked as well

## 2015-09-30 NOTE — Progress Notes (Signed)
Patient ID: Charlotte Henry, female   DOB: 04-28-1941, 75 y.o.   MRN: [id number]    Subjective:    Patient ID: Charlotte Henry, female    DOB: 1940/10/17, 75 y.o.   MRN: [id number]  Patient presents for Lower Back Pain  Pt here for recheck, she was seen last week, with back pain,UTI symptoms. Has known DDD. Back pain resolved and urinating now back at baseline but she still has some left sided flank/side pain. She has noticed more constipation she is getting small balls with BM. She has IBS- C , also pancreatic insuffiency, she is not on Creon currently, but reviewed her last GI papers where they recommended she go back on to help with digestive system.     Review Of Systems:  GEN- denies fatigue, fever, weight loss,weakness, recent illness HEENT- denies eye drainage, change in vision, nasal discharge, CVS- denies chest pain, palpitations RESP- denies SOB, cough, wheeze ABD- denies N/V, change in stools, abd pain GU- denies dysuria, hematuria, dribbling, incontinence MSK- + joint pain, muscle aches, injury Neuro- denies headache, dizziness, syncope, seizure activity       Objective:    BP 122/68 mmHg  Pulse 82  Temp(Src) 98.3 F (36.8 C) (Oral)  Resp 16  Ht '5\' 7"'$  (1.702 m)  Wt 154 lb (69.854 kg)  BMI 24.11 kg/m2 GEN- NAD, alert and oriented x3 HEENT- PERRL, EOMI, non injected sclera, pink conjunctiva, MMM, oropharynx clear CVS- RRR, no murmur RESP-CTAB ABD-NABS,soft, mild TTP left flank, no rebound, no guarding, ND MSK- NT  lumbar spine, fair ROM, neg SLR, Pulses- Radial  2+       Assessment & Plan:      Problem List Items Addressed This Visit    IBS (irritable bowel syndrome)    Known IBS C, now ? If pain related to constipation I recommended imaging, pt declines She wants to try restarting her Creon first per GI recommendations UC to be rechecked as well        Other Visit Diagnoses    Flank pain    -  Primary    No active symptoms, UC rechecked due to flank  pain    Relevant Orders    Urinalysis, Routine w reflex microscopic (not at [facility]) (Completed)    Urine culture       Note: This dictation was prepared with Dragon dictation along with smaller phrase technology. Any transcriptional errors that result from this process are unintentional.

## 2015-10-01 ENCOUNTER — Encounter (HOSPITAL_COMMUNITY): Payer: Commercial Managed Care - HMO

## 2015-10-01 ENCOUNTER — Ambulatory Visit (HOSPITAL_COMMUNITY)
Admission: RE | Admit: 2015-10-01 | Discharge: 2015-10-01 | Disposition: A | Discharge status: Home / Self Care | Payer: Commercial Managed Care - HMO | Source: Ambulatory Visit | Attending: Family Medicine | Admitting: Family Medicine

## 2015-10-01 DIAGNOSIS — J4521 Mild intermittent asthma with (acute) exacerbation: Secondary | ICD-10-CM | POA: Insufficient documentation

## 2015-10-01 LAB — PULMONARY FUNCTION TEST
DL/VA % PRED: 52 %
DL/VA: 2.68 ml/min/mmHg/L
DLCO UNC % PRED: 38 %
DLCO UNC: 10.78 ml/min/mmHg
FEF 25-75 PRE: 1.55 L/s
FEF 25-75 Post: 1.19 L/sec
FEF2575-%CHANGE-POST: -23 %
FEF2575-%PRED-POST: 70 %
FEF2575-%Pred-Pre: 92 %
FEV1-%Change-Post: -4 %
FEV1-%Pred-Post: 84 %
FEV1-%Pred-Pre: 87 %
FEV1-POST: 1.63 L
FEV1-Pre: 1.7 L
FEV1FVC-%CHANGE-POST: 3 %
FEV1FVC-%PRED-PRE: 101 %
FEV6-%CHANGE-POST: -8 %
FEV6-%Pred-Post: 84 %
FEV6-%Pred-Pre: 91 %
FEV6-PRE: 2.2 L
FEV6-Post: 2.02 L
FEV6FVC-%Change-Post: 0 %
FEV6FVC-%PRED-PRE: 104 %
FEV6FVC-%Pred-Post: 103 %
FVC-%CHANGE-POST: -7 %
FVC-%Pred-Post: 81 %
FVC-%Pred-Pre: 88 %
FVC-Post: 2.03 L
FVC-Pre: 2.2 L
POST FEV1/FVC RATIO: 80 %
PRE FEV1/FVC RATIO: 77 %
PRE FEV6/FVC RATIO: 100 %
Post FEV6/FVC ratio: 100 %
RV % PRED: 104 %
RV: 2.56 L
TLC % pred: 85 %
TLC: 4.68 L

## 2015-10-01 MED ORDER — ALBUTEROL SULFATE (2.5 MG/3ML) 0.083% IN NEBU
2.5000 mg | INHALATION_SOLUTION | Freq: Once | RESPIRATORY_TRACT | Status: AC
  Administered 2015-10-01: 2.5 mg via RESPIRATORY_TRACT

## 2015-10-08 ENCOUNTER — Ambulatory Visit: Payer: Commercial Managed Care - HMO | Admitting: Family Medicine

## 2015-10-11 ENCOUNTER — Ambulatory Visit: Payer: Self-pay | Admitting: Family Medicine

## 2015-10-14 ENCOUNTER — Encounter: Payer: Self-pay | Admitting: Gastroenterology

## 2015-10-15 ENCOUNTER — Encounter: Payer: Self-pay | Admitting: Vascular Surgery

## 2015-10-15 ENCOUNTER — Encounter: Payer: Self-pay | Admitting: Family Medicine

## 2015-10-15 ENCOUNTER — Ambulatory Visit (INDEPENDENT_AMBULATORY_CARE_PROVIDER_SITE_OTHER): Payer: Commercial Managed Care - HMO | Admitting: Family Medicine

## 2015-10-15 VITALS — BP 128/68 | HR 80 | Temp 98.5°F | Resp 14 | Ht 67.0 in | Wt 152.0 lb

## 2015-10-15 DIAGNOSIS — J4521 Mild intermittent asthma with (acute) exacerbation: Secondary | ICD-10-CM

## 2015-10-15 DIAGNOSIS — F17200 Nicotine dependence, unspecified, uncomplicated: Secondary | ICD-10-CM

## 2015-10-15 HISTORY — DX: Nicotine dependence, unspecified, uncomplicated: F17.200

## 2015-10-15 MED ORDER — TIOTROPIUM BROMIDE MONOHYDRATE 1.25 MCG/ACT IN AERS: 2.0000 | INHALATION_SPRAY | Freq: Every day | RESPIRATORY_TRACT | Status: DC

## 2015-10-15 NOTE — Progress Notes (Signed)
Patient ID: Charlotte Henry, female   DOB: 02-05-1941, 75 y.o.   MRN: [id number]      Subjective:    Patient ID: Charlotte Henry, female    DOB: [birth year], 75 y.o.   MRN: [id number]  Patient presents for F/U  Patient here to discuss pulmonary function tests. She has history of asthma with bronchitis. She's had bouts of bronchitis and chronic cough she does continue to smoke sometimes upper to one pack per day. Chest x-ray showed hyperinflation consistent with COPD in 2016 pulse and function test was done she reduced DLCO however she had normal lung capacity and lung volumes. There was no significant change with albuterol. I had started her on Spiriva 1.25 respiratory trial she states that her cough and breathing are improved with this use.  She also restarted her Creon and her left side pain has resolved   Review Of Systems:  GEN- denies fatigue, fever, weight loss,weakness, recent illness HEENT- denies eye drainage, change in vision, nasal discharge, CVS- denies chest pain, palpitations RESP- denies SOB, cough, wheeze ABD- denies N/V, change in stools, abd pain GU- denies dysuria, hematuria, dribbling, incontinence MSK- denies joint pain, muscle aches, injury Neuro- denies headache, dizziness, syncope, seizure activity       Objective:    BP 128/68 mmHg  Pulse 80  Temp(Src) 98.5 F (36.9 C) (Oral)  Resp 14  Ht '5\' 7"'$  (1.702 m)  Wt 152 lb (68.947 kg)  BMI 23.80 kg/m2 GEN- NAD, alert and oriented x3 HEENT- PERRL, EOMI, non injected sclera, pink conjunctiva, MMM, oropharynx clear CVS- RRR, no murmur RESP-CTAB ABD-NABS,soft,NT,ND  Pulses- Radial 2+        Assessment & Plan:      Problem List Items Addressed This Visit    Tobacco use disorder   Asthmatic bronchitis - Primary    She states that she remembered she cannot tolerate smoking patches therefore we'll just use Nicorette gum. Will continue the Spiriva with exception of the decreased DLCO her other lung function  tests were fairly normal this may be more of a mixed picture with the asthma and COPD.       Relevant Medications   Tiotropium Bromide Monohydrate (SPIRIVA RESPIMAT) 1.25 MCG/ACT AERS      Note: This dictation was prepared with Dragon dictation along with smaller phrase technology. Any transcriptional errors that result from this process are unintentional.

## 2015-10-15 NOTE — Assessment & Plan Note (Addendum)
She states that she remembered she cannot tolerate smoking patches therefore we'll just use Nicorette gum. Will continue the Spiriva with exception of the decreased DLCO her other lung function tests were fairly normal this may be more of a mixed picture with the asthma and COPD.

## 2015-10-15 NOTE — Patient Instructions (Signed)
Keep on spiriva  Work on the smoking  F/U 4 months

## 2015-10-20 ENCOUNTER — Ambulatory Visit (INDEPENDENT_AMBULATORY_CARE_PROVIDER_SITE_OTHER): Payer: Commercial Managed Care - HMO | Admitting: Vascular Surgery

## 2015-10-20 ENCOUNTER — Encounter: Payer: Self-pay | Admitting: Vascular Surgery

## 2015-10-20 ENCOUNTER — Ambulatory Visit (HOSPITAL_COMMUNITY)
Admission: RE | Admit: 2015-10-20 | Discharge: 2015-10-20 | Disposition: A | Discharge status: Home / Self Care | Payer: Commercial Managed Care - HMO | Source: Ambulatory Visit | Attending: Vascular Surgery | Admitting: Vascular Surgery

## 2015-10-20 VITALS — BP 139/70 | HR 59 | Temp 99.6°F | Resp 16 | Ht 67.0 in | Wt 149.0 lb

## 2015-10-20 DIAGNOSIS — F17200 Nicotine dependence, unspecified, uncomplicated: Secondary | ICD-10-CM | POA: Insufficient documentation

## 2015-10-20 DIAGNOSIS — E785 Hyperlipidemia, unspecified: Secondary | ICD-10-CM | POA: Diagnosis not present

## 2015-10-20 DIAGNOSIS — I70209 Unspecified atherosclerosis of native arteries of extremities, unspecified extremity: Secondary | ICD-10-CM

## 2015-10-20 NOTE — Progress Notes (Signed)
[facility] [place]  Patient name: Charlotte Henry MRN: [id number] DOB: [birth year] Sex: female  REASON FOR VISIT: Follow up of her peripheral vascular disease  HPI: Charlotte Henry is a 75 y.o. female I have been following with peripheral vascular disease. I last saw her on 10/14/2014. At the time of her last visit, she had monophasic signals bilaterally in the dorsalis pedis and posterior tibial positions. ABI on the right was 44% and ABI on the left was 56%. She comes back for a 1 year follow up visit.  Since I saw her last, she denies any real claudication. However she states that she walks very slowly. She denies any history of rest pain or nonhealing ulcers. She does remain fairly active with her senior activities. She does continue to smoke some but is smoking less as she has been so busy.  She is on aspirin and is on a statin.  Past Medical History  Diagnosis Date  . Hypertension   . ASCVD (arteriosclerotic cardiovascular disease)     Neg. stress nuc. 3/99; cath in 4/04,-80%LAD; 70% RCA, -> DESx2; residual 60% distal RCA; nl EF  . Hyperkalemia     potassium of 5.1-5.3 in 2011  . Diabetes mellitus     Treated with oral agents; HgbA1c of 7in 08/2008  . Gout   . GERD (gastroesophageal reflux disease)   . Diverticulosis   . Hyperlipidemia   . Chronic kidney disease (CKD), stage III (moderate)     Creat.= 1.78 in 11/2009  . PVD (peripheral vascular disease) ([place])      moderate to severely decreased ABI's in 8/08, sig. aortic inflow disease; repeat ABI's in 2011 improved- mild disease on the left and moderate to severe disease on the right  . Cerebrovascular disease      mild; <50% ICA with moderate plaque bilaterally in 12/2009  . Asthmatic bronchitis   . Tobacco abuse     50 pack years continuing at 1/2 pack per day  . CHF (congestive heart failure) ([place])   . Arthritis   . Anemia   . Nervousness(799.21)   . Stroke [facility]) 2012    Mini strokes- Temporary   blindness  . Myocardial infarction ([place])   . Coronary disease   . 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 Clostridium difficile colitis OCT 2014  . Carotid artery occlusion     Family History  Problem Relation Age of Onset  . Liver disease Mother 73  . Hypertension Mother   . Cerebral aneurysm Father   . Aneurysm Father 79  . Cancer Sister   . Liver disease Brother   . Liver disease Brother   . Liver disease Brother   . Diabetes type II Brother   . Alcohol abuse Brother   . Cancer Sister     SOCIAL HISTORY: Social History  Substance Use Topics  . Smoking status: Current Every Day Smoker -- 0.50 packs/day for 50 years    Types: Cigarettes    Start date: 02/02/1966  . Smokeless tobacco: Never Used     Comment: 1/2 pack daily  . Alcohol Use: No    Allergies  Allergen Reactions  . Ace Inhibitors Hives, Shortness Of Breath and Swelling  . Aspirin Other (See Comments)    Blood in stool and nose bleed  . Codeine Swelling  . Pineapple Shortness Of Breath and Swelling  . Plasticized Base [Plastibase] Hives and Swelling  . Ultram [Tramadol] Swelling  . Adhesive [Tape] Swelling  and Rash  . Naprosyn [Naproxen] Swelling  . Clindamycin/Lincomycin Other (See Comments)    Burning sensation throat, abdomen  . Linzess [Linaclotide]     DIARRHEA, FECAL INCONTINENCE  . Nsaids   . Penicillins   . Varenicline Tartrate Swelling    Current Outpatient Prescriptions  Medication Sig Dispense Refill  . albuterol (PROVENTIL HFA;VENTOLIN HFA) 108 (90 Base) MCG/ACT inhaler Inhale 2 puffs into the lungs every 4 (four) hours as needed for wheezing or shortness of breath. 1 Inhaler 2  . ALPRAZolam (XANAX) 0.25 MG tablet TAKE 1 TABLET BY MOUTH AT BEDTIME AS NEEDED. 30 tablet 1  . aspirin 81 MG tablet Take 81 mg by mouth daily.      . calcium citrate-vitamin D (CITRACAL+D) 315-200 MG-UNIT per tablet Take 1 tablet by mouth daily.      . Cranberry 250 MG CAPS Take 250 mg by mouth daily.      Marland Kitchen esomeprazole (NEXIUM) 40 MG capsule Take 1 capsule (40 mg total) by mouth daily. 30 capsule 3  . ezetimibe (ZETIA) 10 MG tablet Take 1 tablet (10 mg total) by mouth daily. 90 tablet 3  . lidocaine-prilocaine (EMLA) cream     . lipase/protease/amylase (CREON) 12000 UNITS CPEP capsule 1 with meals and snacks. 270 capsule 5  . mirabegron ER (MYRBETRIQ) 25 MG TB24 tablet Take 1 tablet (25 mg total) by mouth at bedtime. For bladder 30 tablet 6  . pravastatin (PRAVACHOL) 40 MG tablet TAKE 1 TABLET BY MOUTH ONCE DAILY. 90 tablet 3  . Probiotic Product (PROBIOTIC DAILY PO) Take by mouth daily.    . Tiotropium Bromide Monohydrate (SPIRIVA RESPIMAT) 1.25 MCG/ACT AERS Inhale 2 puffs into the lungs daily. 1 Inhaler 11  . valsartan (DIOVAN) 160 MG tablet TAKE 1 TABLET BY MOUTH DAILY. 90 tablet 0  . EPINEPHrine 0.3 mg/0.3 mL IJ SOAJ injection Inject 0.3 mLs (0.3 mg total) into the muscle once. (Patient not taking: Reported on 10/20/2015) 1 Device 3   No current facility-administered medications for this visit.    REVIEW OF SYSTEMS:  '[X]'$  denotes positive finding, '[ ]'$  denotes negative finding Cardiac  Comments:  Chest pain or chest pressure:    Shortness of breath upon exertion: X   Short of breath when lying flat: X   Irregular heart rhythm:        Vascular    Pain in calf, thigh, or hip brought on by ambulation:    Pain in feet at night that wakes you up from your sleep:     Blood clot in your veins:    Leg swelling:         Pulmonary    Oxygen at home:    Productive cough:     Wheezing:         Neurologic    Sudden weakness in arms or legs:     Sudden numbness in arms or legs:     Sudden onset of difficulty speaking or slurred speech:    Temporary loss of vision in one eye:     Problems with dizziness:         Gastrointestinal    Blood in stool:     Vomited blood:         Genitourinary    Burning when urinating:     Blood in urine:        Psychiatric    Major depression:          Hematologic    Bleeding problems:  Problems with blood clotting too easily:        Skin    Rashes or ulcers:        Constitutional    Fever or chills:      PHYSICAL EXAM: Filed Vitals:   10/20/15 1101  BP: 139/70  Pulse: 59  Temp: 99.6 F (37.6 C)  Resp: 16  Height: '5\' 7"'$  (1.702 m)  Weight: 149 lb (67.586 kg)  SpO2: 97%    GENERAL: The patient is a well-nourished female, in no acute distress. The vital signs are documented above. CARDIAC: There is a regular rate and rhythm.  VASCULAR: I do not detect carotid bruits. He has palpable femoral pulses bilaterally. I cannot palpate popliteal or pedal pulses. Both feet are warm and well-perfused. He has no significant lower extremity swelling. PULMONARY: There is good air exchange bilaterally without wheezing or rales. ABDOMEN: Soft and non-tender with normal pitched bowel sounds.  MUSCULOSKELETAL: There are no major deformities or cyanosis. NEUROLOGIC: No focal weakness or paresthesias are detected. SKIN: There are no ulcers or rashes noted. PSYCHIATRIC: The patient has a normal affect.  DATA:   LOWER EXTREMITY ARTERIAL DOPPLER: I have independently interpreted her lower extremity arterial Doppler study. She has monophasic Doppler signals in both feet in the dorsalis pedis and posterior tibial positions. ABI on the right is 52%. ABI in the left is 54%. These are stable. Toe pressure on the right is 46 mmHg. Toe pressure on the left is 70 mmHg.  MEDICAL ISSUES:  BILATERAL INFRAINGUINAL ARTERIAL OCCLUSIVE DISEASE: this patient has bilateral infrainguinal arterial occlusive disease. However she has minimal symptoms as her activity is fairly limited. I have encouraged her to stay as active as possible. She is on aspirin and is on a statin. We have again discussed the importance of tobacco cessation. Given that things are quite stable and she is essentially asymptomatic, I think that it would be reasonable to stretcher follow up  out to 18 months. I have ordered ABIs in 18 months and I will see her back at that time. She knows to call sooner if she has problems.   Deitra Mayo [facility] [place]: [phone]

## 2015-11-05 ENCOUNTER — Other Ambulatory Visit: Payer: Self-pay | Admitting: Family Medicine

## 2015-11-05 NOTE — Telephone Encounter (Signed)
Refill appropriate and filled per protocol. 

## 2015-11-26 ENCOUNTER — Other Ambulatory Visit: Payer: Self-pay | Admitting: Cardiovascular Disease

## 2015-11-30 ENCOUNTER — Encounter: Payer: Self-pay | Admitting: Family Medicine

## 2015-11-30 ENCOUNTER — Ambulatory Visit (INDEPENDENT_AMBULATORY_CARE_PROVIDER_SITE_OTHER): Payer: Commercial Managed Care - HMO | Admitting: Family Medicine

## 2015-11-30 ENCOUNTER — Ambulatory Visit (HOSPITAL_COMMUNITY)
Admission: RE | Admit: 2015-11-30 | Discharge: 2015-11-30 | Disposition: A | Discharge status: Home / Self Care | Payer: Commercial Managed Care - HMO | Source: Ambulatory Visit | Attending: Family Medicine | Admitting: Family Medicine

## 2015-11-30 VITALS — BP 132/70 | HR 66 | Temp 98.3°F | Resp 18 | Ht 67.0 in | Wt 150.0 lb

## 2015-11-30 DIAGNOSIS — J45909 Unspecified asthma, uncomplicated: Secondary | ICD-10-CM | POA: Diagnosis not present

## 2015-11-30 DIAGNOSIS — J4521 Mild intermittent asthma with (acute) exacerbation: Secondary | ICD-10-CM | POA: Diagnosis not present

## 2015-11-30 DIAGNOSIS — R8290 Unspecified abnormal findings in urine: Secondary | ICD-10-CM

## 2015-11-30 DIAGNOSIS — I700 Atherosclerosis of aorta: Secondary | ICD-10-CM | POA: Insufficient documentation

## 2015-11-30 LAB — URINALYSIS, ROUTINE W REFLEX MICROSCOPIC
Bilirubin Urine: NEGATIVE
GLUCOSE, UA: NEGATIVE
Ketones, ur: NEGATIVE
LEUKOCYTES UA: NEGATIVE
NITRITE: NEGATIVE
PROTEIN: NEGATIVE
Specific Gravity, Urine: 1.015 (ref 1.001–1.035)
pH: 6.5 (ref 5.0–8.0)

## 2015-11-30 LAB — URINALYSIS, MICROSCOPIC ONLY
BACTERIA UA: NONE SEEN [HPF]
Casts: NONE SEEN [LPF]
Crystals: NONE SEEN [HPF]
YEAST: NONE SEEN [HPF]

## 2015-11-30 MED ORDER — PREDNISONE 20 MG PO TABS: 40.0000 mg | ORAL_TABLET | Freq: Every day | ORAL | Status: DC

## 2015-11-30 MED ORDER — TIOTROPIUM BROMIDE MONOHYDRATE 1.25 MCG/ACT IN AERS: 2.0000 | INHALATION_SPRAY | Freq: Every day | RESPIRATORY_TRACT | Status: DC

## 2015-11-30 NOTE — Patient Instructions (Signed)
Get the chest xray done Continue the spiriva Try the prednisone F/U as previous

## 2015-11-30 NOTE — Progress Notes (Signed)
Patient ID: Charlotte Henry, female   DOB: 04-21-1941, 75 y.o.   MRN: [id number]    Subjective:    Patient ID: Charlotte Henry, female    DOB: [birth year], 75 y.o.   MRN: [id number]  Patient presents for Cough Patient with persistent cough. States that she's had a cough since her last visit which is back in May. At that time she was on spur Riva rest and had stated her breathing had improved she did not have any significant cough that I can recall. Her cough is mild production she has not noticed any wheezing or shortness of breath. She does continue to smoke. No chest pain. She is not using anything over-the-counter.  While walking out she wanted to have a urinalysis done states her urine looked a little dark  Review Of Systems:  GEN- denies fatigue, fever, weight loss,weakness, recent illness HEENT- denies eye drainage, change in vision, nasal discharge, CVS- denies chest pain, palpitations RESP- denies SOB, +cough, wheeze ABD- denies N/V, change in stools, abd pain GU- denies dysuria, hematuria, dribbling, incontinence MSK- denies joint pain, muscle aches, injury Neuro- denies headache, dizziness, syncope, seizure activity       Objective:    BP 132/70 mmHg  Pulse 66  Temp(Src) 98.3 F (36.8 C) (Oral)  Resp 18  Ht '5\' 7"'$  (1.702 m)  Wt 150 lb (68.04 kg)  BMI 23.49 kg/m2  SpO2 96% GEN- NAD, alert and oriented x3 HEENT- PERRL, EOMI, non injected sclera, pink conjunctiva, MMM, oropharynx clear Neck- Supple, no LAD  CVS- RRR, no murmur RESP- mild congestion scattered wheeze, normal WOB  ABD-NABS,soft,NT,ND, no CVA tenderness EXT- No edema Pulses- Radial,2+        Assessment & Plan:      Problem List Items Addressed This Visit    Asthmatic bronchitis - Primary    Obtain CXR, continue spiriva Given prednisone burst Has albuterol as needed Pending CXR antibiotics Tessalon, robitussin do not help      Relevant Medications   Tiotropium Bromide Monohydrate (SPIRIVA  RESPIMAT) 1.25 MCG/ACT AERS   predniSONE (DELTASONE) 20 MG tablet   Other Relevant Orders   DG Chest 2 View    Other Visit Diagnoses    Abnormal urine odor        Relevant Orders    Urinalysis, Routine w reflex microscopic (not at [facility]) (Completed)    Urine culture       Note: This dictation was prepared with Dragon dictation along with smaller phrase technology. Any transcriptional errors that result from this process are unintentional.

## 2015-11-30 NOTE — Assessment & Plan Note (Signed)
Obtain CXR, continue spiriva Given prednisone burst Has albuterol as needed Pending CXR antibiotics Tessalon, robitussin do not help

## 2015-12-02 LAB — URINE CULTURE
Colony Count: NO GROWTH
Organism ID, Bacteria: NO GROWTH

## 2015-12-06 ENCOUNTER — Other Ambulatory Visit: Payer: Self-pay | Admitting: Family Medicine

## 2015-12-08 ENCOUNTER — Telehealth: Payer: Self-pay | Admitting: Family Medicine

## 2015-12-08 NOTE — Telephone Encounter (Signed)
Received fax from [facility] back Auth# [id number] Treating Provider Dr. Deitra Mayo # of Visits 6 Start Date 10/20/15 End Date 04/17/16 CPT 11464 DX I70.209

## 2015-12-15 ENCOUNTER — Telehealth: Payer: Self-pay | Admitting: Family Medicine

## 2015-12-15 ENCOUNTER — Ambulatory Visit (INDEPENDENT_AMBULATORY_CARE_PROVIDER_SITE_OTHER): Payer: Commercial Managed Care - HMO | Admitting: Family Medicine

## 2015-12-15 VITALS — BP 130/72 | HR 68 | Temp 98.8°F | Resp 14 | Ht 67.0 in | Wt 145.0 lb

## 2015-12-15 DIAGNOSIS — R634 Abnormal weight loss: Secondary | ICD-10-CM | POA: Diagnosis not present

## 2015-12-15 DIAGNOSIS — N183 Chronic kidney disease, stage 3 unspecified: Secondary | ICD-10-CM

## 2015-12-15 DIAGNOSIS — E1159 Type 2 diabetes mellitus with other circulatory complications: Secondary | ICD-10-CM

## 2015-12-15 DIAGNOSIS — J4521 Mild intermittent asthma with (acute) exacerbation: Secondary | ICD-10-CM

## 2015-12-15 LAB — CBC WITH DIFFERENTIAL/PLATELET
BASOS ABS: 0 {cells}/uL (ref 0–200)
Basophils Relative: 0 %
EOS ABS: 186 {cells}/uL (ref 15–500)
Eosinophils Relative: 3 %
HEMATOCRIT: 36.5 % (ref 35.0–45.0)
Hemoglobin: 11.8 g/dL — ABNORMAL LOW (ref 12.0–15.0)
LYMPHS PCT: 41 %
Lymphs Abs: 2542 cells/uL (ref 850–3900)
MCH: 29.9 pg (ref 27.0–33.0)
MCHC: 32.3 g/dL (ref 32.0–36.0)
MCV: 92.4 fL (ref 80.0–100.0)
MONO ABS: 620 {cells}/uL (ref 200–950)
MONOS PCT: 10 %
MPV: 9.2 fL (ref 7.5–12.5)
NEUTROS PCT: 46 %
Neutro Abs: 2852 cells/uL (ref 1500–7800)
PLATELETS: 236 10*3/uL (ref 140–400)
RBC: 3.95 MIL/uL (ref 3.80–5.10)
RDW: 14.1 % (ref 11.0–15.0)
WBC: 6.2 10*3/uL (ref 3.8–10.8)

## 2015-12-15 LAB — COMPREHENSIVE METABOLIC PANEL
ALK PHOS: 49 U/L (ref 33–130)
ALT: 19 U/L (ref 6–29)
AST: 25 U/L (ref 10–35)
Albumin: 3.9 g/dL (ref 3.6–5.1)
BUN: 14 mg/dL (ref 7–25)
CALCIUM: 9.1 mg/dL (ref 8.6–10.4)
CHLORIDE: 105 mmol/L (ref 98–110)
CO2: 27 mmol/L (ref 20–31)
Creat: 0.97 mg/dL — ABNORMAL HIGH (ref 0.60–0.93)
GLUCOSE: 91 mg/dL (ref 70–99)
POTASSIUM: 4.5 mmol/L (ref 3.5–5.3)
Sodium: 139 mmol/L (ref 135–146)
Total Bilirubin: 0.4 mg/dL (ref 0.2–1.2)
Total Protein: 6.3 g/dL (ref 6.1–8.1)

## 2015-12-15 LAB — LIPASE: LIPASE: 9 U/L (ref 7–60)

## 2015-12-15 MED ORDER — PANCRELIPASE (LIP-PROT-AMYL) 12000-38000 UNITS PO CPEP: ORAL_CAPSULE | ORAL | Status: DC

## 2015-12-15 MED ORDER — FLUTICASONE-SALMETEROL 100-50 MCG/DOSE IN AEPB: 1.0000 | INHALATION_SPRAY | Freq: Two times a day (BID) | RESPIRATORY_TRACT | Status: DC

## 2015-12-15 NOTE — Telephone Encounter (Signed)
MD please advise

## 2015-12-15 NOTE — Telephone Encounter (Signed)
Yes, if she is feeling okay

## 2015-12-15 NOTE — Telephone Encounter (Signed)
Pt forgot to ask Dr. Buelah Manis if she can go on a day trip with the senior citizens to [place] this coming Monday. Please advise [phone] or [phone]

## 2015-12-15 NOTE — Patient Instructions (Addendum)
Weight loss - you have to eat, take the creon Dehydrated - drink more fluids  Try the advair  We will call with lab results  F/u 1 week

## 2015-12-15 NOTE — Progress Notes (Signed)
Patient ID: Charlotte Henry, female   DOB: 12/31/1940, 75 y.o.   MRN: [id number]   Subjective:    Patient ID: Charlotte Henry, female    DOB: 02-03-1941, 75 y.o.   MRN: [id number]  Patient presents for Illness Patient here with just a feeling of being unwell. She is not having pain anywhere to states that she's fatigue she has no appetite her weight is down 5 pounds in 2 weeks. She has not been taking her Creon as we previously discussed. Recent asthma bronchitis flare she had chest x-ray which was negative urinalysis culture done which was negative. She states she feels like her skin is tight. She cannot give me really any specifics otherwise just a feeling of overall unwell.  She states she is taking all her other medications as prescribed. She states her blood sugar has been good She does  Not like the ensure it ,messes with her bowels  Review Of Systems:  GEN- denies fatigue, fever, +weight loss,+weakness, recent illness HEENT- denies eye drainage, change in vision, nasal discharge, CVS- denies chest pain, palpitations RESP- denies SOB, cough, wheeze ABD- denies N/V, change in stools, abd pain GU- denies dysuria, hematuria, dribbling, incontinence MSK- denies joint pain, muscle aches, injury Neuro- denies headache, dizziness, syncope, seizure activity       Objective:    BP 130/72 mmHg  Pulse 68  Temp(Src) 98.8 F (37.1 C) (Oral)  Resp 14  Ht '5\' 7"'$  (1.702 m)  Wt 145 lb (65.772 kg)  BMI 22.71 kg/m2 GEN- NAD, alert and oriented x3,weight loss 5lbs HEENT- PERRL, EOMI, non injected sclera, pink conjunctiva, MMM, oropharynx clear Neck- Supple, no thyromegaly CVS- RRR, no murmur RESP-CTAB ABD-NABS,soft,NT,ND Skin- intact no rash  EXT- No edema Pulses- Radial, DP- 2+        Assessment & Plan:      Problem List Items Addressed This Visit    Diabetes mellitus ([place]) - Primary   Relevant Orders   CBC with Differential/Platelet (Completed)   Hemoglobin A1c (Completed)   Chronic kidney disease (CKD), stage III (moderate)    Check Cr, ensure at baseline as she has since of overwell since of feeling unwell I have checked CXR, Urine culture. No sign of active infection no pain But loosing weight due to primarily not eating She has IBS which has been treated with creon, she has not been taking Will have her take these and return next week for weight check Consider adding remeron  If weight continues to fall will need CT abd/pelvis to look at any other GI source of possible malginancy       Relevant Orders   Comprehensive metabolic panel (Completed)   Asthmatic bronchitis    Given sample of Advair to try       Relevant Medications   Fluticasone-Salmeterol (ADVAIR) 100-50 MCG/DOSE AEPB    Other Visit Diagnoses    Loss of weight        5lb weight loss    Relevant Orders    Lipase (Completed)       Note: This dictation was prepared with Dragon dictation along with smaller phrase technology. Any transcriptional errors that result from this process are unintentional.

## 2015-12-15 NOTE — Telephone Encounter (Signed)
Call placed to patient and patient made aware.  

## 2015-12-16 ENCOUNTER — Encounter: Payer: Self-pay | Admitting: Family Medicine

## 2015-12-16 ENCOUNTER — Other Ambulatory Visit: Payer: Self-pay | Admitting: Cardiovascular Disease

## 2015-12-16 LAB — HEMOGLOBIN A1C
Hgb A1c MFr Bld: 6.4 % — ABNORMAL HIGH (ref <5.7)
MEAN PLASMA GLUCOSE: 137 mg/dL

## 2015-12-16 NOTE — Assessment & Plan Note (Signed)
Given sample of Advair to try

## 2015-12-16 NOTE — Assessment & Plan Note (Signed)
Check Cr, ensure at baseline as she has since of overwell since of feeling unwell I have checked CXR, Urine culture. No sign of active infection no pain But loosing weight due to primarily not eating She has IBS which has been treated with creon, she has not been taking Will have her take these and return next week for weight check Consider adding remeron  If weight continues to fall will need CT abd/pelvis to look at any other GI source of possible malginancy

## 2015-12-22 ENCOUNTER — Encounter: Payer: Self-pay | Admitting: Family Medicine

## 2015-12-22 ENCOUNTER — Ambulatory Visit (INDEPENDENT_AMBULATORY_CARE_PROVIDER_SITE_OTHER): Payer: Commercial Managed Care - HMO | Admitting: Family Medicine

## 2015-12-22 VITALS — BP 138/70 | HR 64 | Temp 98.8°F | Resp 14 | Wt 149.0 lb

## 2015-12-22 DIAGNOSIS — E1159 Type 2 diabetes mellitus with other circulatory complications: Secondary | ICD-10-CM

## 2015-12-22 DIAGNOSIS — K589 Irritable bowel syndrome without diarrhea: Secondary | ICD-10-CM | POA: Diagnosis not present

## 2015-12-22 DIAGNOSIS — J4521 Mild intermittent asthma with (acute) exacerbation: Secondary | ICD-10-CM | POA: Diagnosis not present

## 2015-12-22 MED ORDER — LUBIPROSTONE 8 MCG PO CAPS: 8.0000 ug | ORAL_CAPSULE | Freq: Two times a day (BID) | ORAL | Status: DC

## 2015-12-22 NOTE — Patient Instructions (Signed)
F/U 4 months  

## 2015-12-22 NOTE — Assessment & Plan Note (Signed)
We having difficulty with her appetite and her bowels now. The Creon as made her constipated even with only taking 2 capsules a day. She is follow with her gastroenterologist tomorrow. Her weight is up but some of this may suggest be the constipation. She has tried MiraLAX other stool softeners, linzess  and Amitiza in the past. She does not remember particularly why she came off of these. The last time Amitiza was prescribed as 2015. She would like to try another sample of this. I gave her 8 mg twice a day to try

## 2015-12-22 NOTE — Assessment & Plan Note (Signed)
She will try the Advair once her Spiriva completes her breathing is at baseline today

## 2015-12-22 NOTE — Assessment & Plan Note (Signed)
Diet-controlled no change in medication

## 2015-12-22 NOTE — Progress Notes (Signed)
Patient ID: Charlotte Henry, female   DOB: [birth year], 75 y.o.   MRN: [id number]   Subjective:    Patient ID: Charlotte Henry, female    DOB: February 11, 1941, 75 y.o.   MRN: [id number]  Patient presents for Follow-up  Patient air for follow-up she was seen 2 weeks ago which is extensive feeling ill. She cannot give any specific she did not having pain anywhere. She had lost weight her weight was down 5 pounds in 2 weeks. She was not taking her Creon as prescribed. She is here today for follow-up and for weight check. Her weight is up 4 pounds in one week. She states that she is feeling much better. She has not had Bowel movement in past 4 days though, states creon constipates her appointment to see Gastroenterology tomorrow   She also has COPD advised her to use Advair which she was given samples, she has been using up Spiriva but breathing has been better   Review Of Systems:  GEN- denies fatigue, fever, weight loss,weakness, recent illness HEENT- denies eye drainage, change in vision, nasal discharge, CVS- denies chest pain, palpitations RESP- denies SOB, cough, wheeze ABD- denies N/V, +change in stools, abd pain GU- denies dysuria, hematuria, dribbling, incontinence MSK- denies joint pain, muscle aches, injury Neuro- denies headache, dizziness, syncope, seizure activity       Objective:    BP 138/70 mmHg  Pulse 64  Temp(Src) 98.8 F (37.1 C) (Oral)  Resp 14  Wt 149 lb (67.586 kg) GEN- NAD, alert and oriented x3 HEENT- PERRL, EOMI, non injected sclera, pink conjunctiva, MMM, oropharynx clear Neck- Supple, no thyromegaly CVS- RRR, ? Soft systolic murmur at LSB  RESP-CTAB ABD-NABS,soft,NT,ND EXT- No edema Pulses- Radial  2+        Assessment & Plan:      Problem List Items Addressed This Visit    IBS (irritable bowel syndrome)    We having difficulty with her appetite and her bowels now. The Creon as made her constipated even with only taking 2 capsules a day. She is  follow with her gastroenterologist tomorrow. Her weight is up but some of this may suggest be the constipation. She has tried MiraLAX other stool softeners, linzess  and Amitiza in the past. She does not remember particularly why she came off of these. The last time Amitiza was prescribed as 2015. She would like to try another sample of this. I gave her 8 mg twice a day to try      Relevant Medications   lubiprostone (AMITIZA) 8 MCG capsule   Diabetes mellitus ([place]) - Primary    Diet-controlled no change in medication      Asthmatic bronchitis    She will try the Advair once her Spiriva completes her breathing is at baseline today         Note: This dictation was prepared with Dragon dictation along with smaller phrase technology. Any transcriptional errors that result from this process are unintentional.

## 2015-12-23 ENCOUNTER — Other Ambulatory Visit: Payer: Self-pay

## 2015-12-23 ENCOUNTER — Ambulatory Visit (INDEPENDENT_AMBULATORY_CARE_PROVIDER_SITE_OTHER): Payer: Commercial Managed Care - HMO | Admitting: Gastroenterology

## 2015-12-23 ENCOUNTER — Encounter: Payer: Self-pay | Admitting: Gastroenterology

## 2015-12-23 VITALS — BP 116/66 | HR 61 | Temp 98.6°F | Ht 67.0 in | Wt 148.0 lb

## 2015-12-23 DIAGNOSIS — R630 Anorexia: Secondary | ICD-10-CM | POA: Diagnosis not present

## 2015-12-23 DIAGNOSIS — R634 Abnormal weight loss: Secondary | ICD-10-CM | POA: Diagnosis not present

## 2015-12-23 DIAGNOSIS — A048 Other specified bacterial intestinal infections: Secondary | ICD-10-CM

## 2015-12-23 MED ORDER — MIRTAZAPINE 7.5 MG PO TABS: ORAL_TABLET | ORAL | Status: DC

## 2015-12-23 NOTE — Progress Notes (Signed)
Subjective:    Patient ID: Charlotte Henry, female    DOB: [birth year], 75 y.o.   MRN: [id number]  [name], MD  HPI Appetite been poor since dec 2016. Lost 12 lbs-not trying. Trouble with Constipation if takes Creon. GIVEN AMITIZA SAMPLES. BREATHING PRETTY GOOD. DOESN'T FEEL LIKE EATING BUT FOOD NOT CAUSING ABDOMINAL PAIN. TERRIBLE COUGH IN MAY AND GIVEN SPIRIVA. YESTERDAY GOT ADVAIR. ON PREDNISONE FOR 5 DAYS LAST OXBD([id number]). MAY HAVE PAIN IN SIDE WHEN SHE IS CONSTIPATED. [place]. JUST CAME BACK FROM [place] [facility] [place]. HAD A GOOD TIME IN [place] THIS WEEKEND. PT DENIES FEVER, CHILLS, HEMATOCHEZIA, HEMATEMESIS, nausea, vomiting, melena, diarrhea, CHEST PAIN, SHORTNESS OF BREATH, DYSURIA, HEMATURIA, CHANGE IN BOWEL IN HABITS, abdominal pain, problems swallowing, OR heartburn or indigestion.  REVIEWED IMAGING FROM 2011 TO PRESENT.  Past Medical History  Diagnosis Date  . Hypertension   . ASCVD (arteriosclerotic cardiovascular disease)     Neg. stress nuc. 3/99; cath in 4/04,-80%LAD; 70% RCA, -> DESx2; residual 60% distal RCA; nl EF  . Hyperkalemia     potassium of 5.1-5.3 in 2011  . Diabetes mellitus     Treated with oral agents; HgbA1c of 7in 08/2008  . Gout   . GERD (gastroesophageal reflux disease)   . Diverticulosis   . Hyperlipidemia   . Chronic kidney disease (CKD), stage III (moderate)     Creat.= 1.78 in 11/2009  . PVD (peripheral vascular disease) ([place])      moderate to severely decreased ABI's in 8/08, sig. aortic inflow disease; repeat ABI's in 2011 improved- mild disease on the left and moderate to severe disease on the right  . Cerebrovascular disease      mild; <50% ICA with moderate plaque bilaterally in 12/2009  . Asthmatic bronchitis   . Tobacco abuse     50 pack years continuing at 1/2 pack per day  . CHF (congestive heart failure) ([place])   . Arthritis   . Anemia   . Nervousness(799.21)   . Stroke [facility]) 2012    Mini  strokes- Temporary  blindness  . Myocardial infarction ([place])   . Coronary disease   . 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 Clostridium difficile colitis OCT 2014  . Carotid artery occlusion    Past Surgical History  Procedure Laterality Date  . Cholecystectomy    . Carpel tunnel release      bilateral  . Total abdominal hysterectomy w/ bilateral salpingoophorectomy    . Colonoscopy  2009  . Abdominal hysterectomy     Allergies  Allergen Reactions  . Ace Inhibitors Hives, Shortness Of Breath and Swelling  . Aspirin Other (See Comments)    Blood in stool and nose bleed  . Codeine Swelling  . Pineapple Shortness Of Breath and Swelling  . Plasticized Base [Plastibase] Hives and Swelling  . Ultram [Tramadol] Swelling  . Adhesive [Tape] Swelling and Rash  . Naprosyn [Naproxen] Swelling  . Clindamycin/Lincomycin Other (See Comments)    Burning sensation throat, abdomen  . Linzess [Linaclotide]     DIARRHEA, FECAL INCONTINENCE  . Nsaids   . Penicillins   . Varenicline Tartrate Swelling    Current Outpatient Prescriptions  Medication Sig Dispense Refill  . albuterol MCG/ACT inhaler Inhale 2 puffs Q4H PRN for wheezing or shortness of breath.    . ALPRAZolam (XANAX) 0.25 MG tablet TAKE 1 TABLET BY MOUTH AT BEDTIME AS NEEDED.    Marland Kitchen aspirin 81 MG tablet Take 81 mg by  mouth daily.      Marland Kitchen CITRACAL+D 315-200 MG-UNIT per tablet Take 1 tablet by mouth daily.      . Cranberry 250 MG CAPS Take 250 mg by mouth daily.    Marland Kitchen esomeprazole (NEXIUM) 40 MG capsule Take 1 capsule (40 mg total) by mouth daily.    Marland Kitchen ADVAIR 100-50 MCG/DOSE AEPB Inhale 1 puff into the lungs 2 (two) times daily.    Marland Kitchen lidocaine-prilocaine (EMLA) cream     . CREON 12000 units CPEP capsule 1 with meals and snacks.    Marland Kitchen MYRBETRIQ 25 MG TB24 tablet Take 1 tablet (25 mg total) by mouth at bedtime. For bladder    . pravastatin (PRAVACHOL) 40 MG tablet TAKE 1 TABLET BY MOUTH ONCE DAILY.    . Probiotic Product (PROBIOTIC  DAILY PO) Take by mouth daily.    Marland Kitchen SPIRIVA RESPIMAT 1.25 MCG/ACT AERS Inhale 2 puffs into the lungs daily.    . valsartan (DIOVAN) 160 MG tablet TAKE 1 TABLET BY MOUTH DAILY.    Marland Kitchen ZETIA 10 MG tablet TAKE 1 TABLET BY MOUTH ONCE A DAY.    .      .       Review of Systems PER HPI OTHERWISE ALL SYSTEMS ARE NEGATIVE.    Objective:   Physical Exam  Constitutional: She is oriented to person, place, and time. She appears well-developed and well-nourished. No distress.  HENT:  Head: Normocephalic and atraumatic.  Mouth/Throat: Oropharynx is clear and moist. No oropharyngeal exudate.  Eyes: Pupils are equal, round, and reactive to light. No scleral icterus.  Neck: Normal range of motion. Neck supple.  Cardiovascular: Normal rate, regular rhythm and normal heart sounds.   Pulmonary/Chest: Effort normal and breath sounds normal. No respiratory distress.  Abdominal: Soft. Bowel sounds are normal. She exhibits no distension. There is no tenderness.  No bruits  Musculoskeletal: She exhibits no edema.  Lymphadenopathy:    She has no cervical adenopathy.  Neurological: She is alert and oriented to person, place, and time.  NO  NEW FOCAL DEFICITS  Psychiatric: She has a normal mood and affect.  Vitals reviewed.     Assessment & Plan:

## 2015-12-23 NOTE — Progress Notes (Signed)
CC'D TO PCP °

## 2015-12-23 NOTE — Assessment & Plan Note (Addendum)
ETIOLOGY UNCLEAR. DIFFERENTIAL DIAGNOSIS INCLUDE COPD CACHEXIA, LESS LIKELY MESENTERIC ISCHEMIA, OCCULT MALIGNANCY OR H PYLORI GASTRITIS. CXR UTD.  USE CARNATION INSTANT BREAKFAST 2-3 TIMES A DAY TO PREVENT WEIGHT LOSS. COMPLETE BREATH TEST TO MAKE SURE H PYLORI IS GONE. COMPLETE CT SCAN ABD/PELVIS. ADD REMERON AT BEDTIME TO STIMULATE APPETITE.

## 2015-12-23 NOTE — Assessment & Plan Note (Signed)
DIFFERENTIAL DIAGNOSIS INCLUDE COPD CACHEXIA, LESS LIKELY OCCULT MALIGNANCY OR H PYLORI GASTRITIS. CXR UTD.  USE CARNATION INSTANT BREAKFAST 2-3 TIMES A DAY TO PREVENT WEIGHT LOSS. COMPLETE BREATH TEST TO MAKE SURE H PYLORI IS GONE. COMPLETE CT SCAN ABD/PELVIS. ADD REMERON AT BEDTIME TO STIMULATE APPETITE.  FOLLOW UP IN 3 MOS.

## 2015-12-23 NOTE — Patient Instructions (Signed)
USE CARNATION INSTANT BREAKFAST 2-3 TIMES A DAY TO PREVENT WEIGHT LOSS.   COMPLETE BREATH TEST TO MAKE SURE H PYLORI IS GONE.  COMPLETE CT SCAN.  ADD REMERON AT BEDTIME TO STIMULATE APPETITE.  FOLLOW UP IN 3 MOS.

## 2015-12-29 ENCOUNTER — Encounter (HOSPITAL_COMMUNITY): Payer: Self-pay

## 2015-12-29 ENCOUNTER — Telehealth: Payer: Self-pay

## 2015-12-29 ENCOUNTER — Encounter (HOSPITAL_COMMUNITY)
Admission: RE | Admit: 2015-12-29 | Discharge: 2015-12-29 | Disposition: A | Discharge status: Home / Self Care | Payer: Commercial Managed Care - HMO | Source: Ambulatory Visit | Attending: Gastroenterology | Admitting: Gastroenterology

## 2015-12-29 DIAGNOSIS — A048 Other specified bacterial intestinal infections: Secondary | ICD-10-CM

## 2015-12-29 DIAGNOSIS — B9681 Helicobacter pylori [H. pylori] as the cause of diseases classified elsewhere: Secondary | ICD-10-CM | POA: Insufficient documentation

## 2015-12-29 NOTE — Telephone Encounter (Signed)
Pt was unable to do her breath test this morning due to taking Nexium. She did not want to reschedule. Please advise

## 2015-12-30 NOTE — Telephone Encounter (Signed)
REVIEWED-NO ADDITIONAL RECOMMENDATIONS. 

## 2016-01-03 ENCOUNTER — Ambulatory Visit (HOSPITAL_COMMUNITY)
Admission: RE | Admit: 2016-01-03 | Discharge: 2016-01-03 | Disposition: A | Discharge status: Home / Self Care | Payer: Commercial Managed Care - HMO | Source: Ambulatory Visit | Attending: Gastroenterology | Admitting: Gastroenterology

## 2016-01-03 ENCOUNTER — Telehealth: Payer: Self-pay | Admitting: Gastroenterology

## 2016-01-03 DIAGNOSIS — K8689 Other specified diseases of pancreas: Secondary | ICD-10-CM | POA: Diagnosis not present

## 2016-01-03 DIAGNOSIS — R634 Abnormal weight loss: Secondary | ICD-10-CM | POA: Insufficient documentation

## 2016-01-03 DIAGNOSIS — N281 Cyst of kidney, acquired: Secondary | ICD-10-CM | POA: Insufficient documentation

## 2016-01-03 DIAGNOSIS — E278 Other specified disorders of adrenal gland: Secondary | ICD-10-CM | POA: Insufficient documentation

## 2016-01-03 MED ORDER — IOPAMIDOL (ISOVUE-300) INJECTION 61%
100.0000 mL | Freq: Once | INTRAVENOUS | Status: AC | PRN
  Administered 2016-01-03: 100 mL via INTRAVENOUS

## 2016-01-03 NOTE — Telephone Encounter (Signed)
PLEASE CALL PT. HER CT SHOWS ATROPHY OF HER PANCREAS. SHE HAS NO ACUTE PATHOLOGY IN HER ABDOMEN OR PELVIS. SHE HAS SOME HARDENING OF THE ARTERIES IN THE ABDOMEN AND PELVIS BUT NONE ARE CLINICALLY SIGNIFICANT.

## 2016-01-04 ENCOUNTER — Other Ambulatory Visit: Payer: Self-pay | Admitting: Vascular Surgery

## 2016-01-04 DIAGNOSIS — I70209 Unspecified atherosclerosis of native arteries of extremities, unspecified extremity: Secondary | ICD-10-CM

## 2016-01-05 NOTE — Telephone Encounter (Signed)
Pt is aware.  

## 2016-01-10 ENCOUNTER — Telehealth: Payer: Self-pay | Admitting: *Deleted

## 2016-01-10 MED ORDER — LUBIPROSTONE 8 MCG PO CAPS: 8.0000 ug | ORAL_CAPSULE | Freq: Two times a day (BID) | ORAL | 1 refills | Status: DC

## 2016-01-10 NOTE — Telephone Encounter (Signed)
Received call from patient.    Requested refill on Amitiza.   Prescription sent to pharmacy.

## 2016-01-12 ENCOUNTER — Telehealth: Payer: Self-pay

## 2016-01-12 ENCOUNTER — Other Ambulatory Visit: Payer: Self-pay

## 2016-01-12 DIAGNOSIS — R209 Unspecified disturbances of skin sensation: Secondary | ICD-10-CM

## 2016-01-12 DIAGNOSIS — I739 Peripheral vascular disease, unspecified: Secondary | ICD-10-CM

## 2016-01-12 DIAGNOSIS — R200 Anesthesia of skin: Secondary | ICD-10-CM

## 2016-01-12 NOTE — Telephone Encounter (Signed)
Discussed pt's symptoms with Dr. Scot Dock.  Recommended to schedule for ABI's and office exam.  Will contact pt. For appts.

## 2016-01-12 NOTE — Telephone Encounter (Signed)
Phone call from pt. with request for appt. With Dr. Scot Dock.  Reported her right leg and foot stays numb most of the time.  Reported that the right foot gets cold at times, especially at night.  Denied pain in right LE; stated "I don't have much pain in it."  Reported with walking she mostly drags her right leg, due to the numbness.  Denied any nonhealing ulcers.  Advised will discuss w/ Dr. Scot Dock to obtain recommendation to repeat vascular studies, then will contact pt. For an appt.  Agreed.

## 2016-01-12 NOTE — Telephone Encounter (Signed)
Sched appt 8/14; lab at 2:30 and NP at 3:15. Spoke to pt to inform them of appt.

## 2016-01-13 ENCOUNTER — Encounter: Payer: Self-pay | Admitting: Vascular Surgery

## 2016-01-14 ENCOUNTER — Other Ambulatory Visit: Payer: Self-pay | Admitting: Family Medicine

## 2016-01-17 ENCOUNTER — Encounter: Payer: Self-pay | Admitting: Family

## 2016-01-17 ENCOUNTER — Ambulatory Visit (INDEPENDENT_AMBULATORY_CARE_PROVIDER_SITE_OTHER): Payer: Commercial Managed Care - HMO | Admitting: Family

## 2016-01-17 ENCOUNTER — Ambulatory Visit (HOSPITAL_COMMUNITY)
Admission: RE | Admit: 2016-01-17 | Discharge: 2016-01-17 | Disposition: A | Discharge status: Home / Self Care | Payer: Commercial Managed Care - HMO | Source: Ambulatory Visit | Attending: Surgery | Admitting: Surgery

## 2016-01-17 ENCOUNTER — Encounter: Payer: Self-pay | Admitting: *Deleted

## 2016-01-17 VITALS — BP 135/69 | HR 62 | Temp 97.6°F | Resp 16 | Ht 67.0 in | Wt 154.0 lb

## 2016-01-17 DIAGNOSIS — I130 Hypertensive heart and chronic kidney disease with heart failure and stage 1 through stage 4 chronic kidney disease, or unspecified chronic kidney disease: Secondary | ICD-10-CM | POA: Insufficient documentation

## 2016-01-17 DIAGNOSIS — E785 Hyperlipidemia, unspecified: Secondary | ICD-10-CM | POA: Diagnosis not present

## 2016-01-17 DIAGNOSIS — R209 Unspecified disturbances of skin sensation: Secondary | ICD-10-CM

## 2016-01-17 DIAGNOSIS — R200 Anesthesia of skin: Secondary | ICD-10-CM | POA: Insufficient documentation

## 2016-01-17 DIAGNOSIS — Z720 Tobacco use: Secondary | ICD-10-CM

## 2016-01-17 DIAGNOSIS — N183 Chronic kidney disease, stage 3 (moderate): Secondary | ICD-10-CM | POA: Diagnosis not present

## 2016-01-17 DIAGNOSIS — I2510 Atherosclerotic heart disease of native coronary artery without angina pectoris: Secondary | ICD-10-CM | POA: Diagnosis not present

## 2016-01-17 DIAGNOSIS — E1122 Type 2 diabetes mellitus with diabetic chronic kidney disease: Secondary | ICD-10-CM | POA: Diagnosis not present

## 2016-01-17 DIAGNOSIS — I7409 Other arterial embolism and thrombosis of abdominal aorta: Secondary | ICD-10-CM | POA: Diagnosis not present

## 2016-01-17 DIAGNOSIS — I6523 Occlusion and stenosis of bilateral carotid arteries: Secondary | ICD-10-CM

## 2016-01-17 DIAGNOSIS — K219 Gastro-esophageal reflux disease without esophagitis: Secondary | ICD-10-CM | POA: Diagnosis not present

## 2016-01-17 DIAGNOSIS — I739 Peripheral vascular disease, unspecified: Secondary | ICD-10-CM | POA: Diagnosis not present

## 2016-01-17 DIAGNOSIS — I509 Heart failure, unspecified: Secondary | ICD-10-CM | POA: Insufficient documentation

## 2016-01-17 DIAGNOSIS — R208 Other disturbances of skin sensation: Secondary | ICD-10-CM | POA: Diagnosis not present

## 2016-01-17 DIAGNOSIS — R0989 Other specified symptoms and signs involving the circulatory and respiratory systems: Secondary | ICD-10-CM | POA: Diagnosis present

## 2016-01-17 DIAGNOSIS — F17200 Nicotine dependence, unspecified, uncomplicated: Secondary | ICD-10-CM

## 2016-01-17 NOTE — Progress Notes (Signed)
[facility] [place]   CC: Follow up peripheral artery occlusive disease  History of Present Illness Charlotte Henry is a 75 y.o. female  Dr. Scot Dock has been following with peripheral vascular disease.   He last saw her on 10/20/15. At the time she had bilateral infrainguinal arterial occlusive disease. However she had minimal symptoms as her activity was fairly limited. Dr. Scot Dock encouraged her to stay as active as possible. She is on aspirin and is on a statin. He again discussed the importance of tobacco cessation. Given that things are quite stable and she is essentially asymptomatic, Dr. Scot Dock thought that it would be reasonable to stretch her follow up out to 18 months. He ordered ABIs in 18 months and will see her back at that time. She knows to call sooner if she has problems.  She returns today with c/o both feet started tingling 1.5 weeks ago, right is worse.  Her right foot burns when she walks, relieved by elevation.  She denies non healing wounds.  She uses a lidocaine patch on her right knee for OA pain, states it helps.  She is on aspirin and is on a statin.   Pt Diabetic: 12/15/15 A1C was 6.4 (review of records) Pt smoker: smoker (1/2 ppd x 50 yrs, resumed smoking cigarettes since 4 close friends died)  Pt meds include:  Statin :Yes  Betablocker: No  ASA: Yes  Other anticoagulants/antiplatelets: no   Past Medical History:  Diagnosis Date  . Anemia   . 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 Arthritis   . ASCVD (arteriosclerotic cardiovascular disease)    Neg. stress nuc. 3/99; cath in 4/04,-80%LAD; 70% RCA, -> DESx2; residual 60% distal RCA; nl EF  . Asthmatic bronchitis   . Carotid artery occlusion   . Cerebrovascular disease     mild; <50% ICA with moderate plaque bilaterally in 12/2009  . CHF (congestive heart failure) ([place])   . Chronic kidney disease (CKD), stage III (moderate)    Creat.= 1.78 in 11/2009  . Clostridium difficile  colitis OCT 2014  . Coronary disease   . Diabetes mellitus    Treated with oral agents; HgbA1c of 7in 08/2008  . Diverticulosis   . GERD (gastroesophageal reflux disease)   . Gout   . Hyperkalemia    potassium of 5.1-5.3 in 2011  . Hyperlipidemia   . Hypertension   . Myocardial infarction ([place])   . Nervousness(799.21)   . PVD (peripheral vascular disease) ([place])     moderate to severely decreased ABI's in 8/08, sig. aortic inflow disease; repeat ABI's in 2011 improved- mild disease on the left and moderate to severe disease on the right  . Stroke [facility]) 2012   Mini strokes- Temporary  blindness  . Tobacco abuse    50 pack years continuing at 1/2 pack per day    Social History Social History  Substance Use Topics  . Smoking status: Current Every Day Smoker    Packs/day: 0.50    Years: 50.00    Types: Cigarettes    Start date: 02/02/1966  . Smokeless tobacco: Never Used     Comment: 1/2 pack daily  . Alcohol use No    Family History Family History  Problem Relation Age of Onset  . Liver disease Mother 62  . Hypertension Mother   . Cerebral aneurysm Father   . Aneurysm Father 29  . Cancer Sister   . Liver disease Brother   . Liver disease Brother   .  Liver disease Brother   . Diabetes type II Brother   . Alcohol abuse Brother   . Cancer Sister     Past Surgical History:  Procedure Laterality Date  . ABDOMINAL HYSTERECTOMY    . carpel tunnel release     bilateral  . CHOLECYSTECTOMY    . COLONOSCOPY  2009  . TOTAL ABDOMINAL HYSTERECTOMY W/ BILATERAL SALPINGOOPHORECTOMY      Allergies  Allergen Reactions  . Ace Inhibitors Hives, Shortness Of Breath and Swelling  . Aspirin Other (See Comments)    Blood in stool and nose bleed  . Codeine Swelling  . Pineapple Shortness Of Breath and Swelling  . Plasticized Base [Plastibase] Hives and Swelling  . Ultram [Tramadol] Swelling  . Adhesive [Tape] Swelling and Rash  . Naprosyn [Naproxen] Swelling  .  Clindamycin/Lincomycin Other (See Comments)    Burning sensation throat, abdomen  . Linzess [Linaclotide]     DIARRHEA, FECAL INCONTINENCE  . Nsaids   . Penicillins   . Varenicline Tartrate Swelling    Current Outpatient Prescriptions  Medication Sig Dispense Refill  . albuterol (PROVENTIL HFA;VENTOLIN HFA) 108 (90 Base) MCG/ACT inhaler Inhale 2 puffs into the lungs every 4 (four) hours as needed for wheezing or shortness of breath. 1 Inhaler 2  . ALPRAZolam (XANAX) 0.25 MG tablet TAKE 1 TABLET BY MOUTH AT BEDTIME AS NEEDED. 30 tablet 1  . aspirin 81 MG tablet Take 81 mg by mouth daily.      . calcium citrate-vitamin D (CITRACAL+D) 315-200 MG-UNIT per tablet Take 1 tablet by mouth daily.      . Cranberry 250 MG CAPS Take 250 mg by mouth daily.    Marland Kitchen EPINEPHrine 0.3 mg/0.3 mL IJ SOAJ injection Inject 0.3 mLs (0.3 mg total) into the muscle once. 1 Device 3  . esomeprazole (NEXIUM) 40 MG capsule Take 1 capsule (40 mg total) by mouth daily. 30 capsule 3  . Fluticasone-Salmeterol (ADVAIR) 100-50 MCG/DOSE AEPB Inhale 1 puff into the lungs 2 (two) times daily. 1 each 3  . lidocaine-prilocaine (EMLA) cream     . lipase/protease/amylase (CREON) 12000 units CPEP capsule 1 with meals and snacks. 270 capsule 5  . lubiprostone (AMITIZA) 8 MCG capsule Take 1 capsule (8 mcg total) by mouth 2 (two) times daily with a meal. 60 capsule 1  . mirabegron ER (MYRBETRIQ) 25 MG TB24 tablet Take 1 tablet (25 mg total) by mouth at bedtime. For bladder 30 tablet 6  . mirtazapine (REMERON) 7.5 MG tablet 1 PO QHS 30 tablet 5  . pravastatin (PRAVACHOL) 40 MG tablet TAKE 1 TABLET BY MOUTH ONCE DAILY. 90 tablet 0  . Probiotic Product (PROBIOTIC DAILY PO) Take by mouth daily.    . Tiotropium Bromide Monohydrate (SPIRIVA RESPIMAT) 1.25 MCG/ACT AERS Inhale 2 puffs into the lungs daily. 1 Inhaler 11  . valsartan (DIOVAN) 160 MG tablet TAKE 1 TABLET BY MOUTH DAILY. 90 tablet 0  . ZETIA 10 MG tablet TAKE 1 TABLET BY MOUTH ONCE  A DAY. 30 tablet 1   No current facility-administered medications for this visit.     ROS: See HPI for pertinent positives and negatives.   Physical Examination  Vitals:   01/17/16 1451  BP: 135/69  Pulse: 62  Resp: 16  Temp: 97.6 F (36.4 C)  SpO2: 90%  Weight: 154 lb (69.9 kg)  Height: '5\' 7"'$  (1.702 m)   Body mass index is 24.12 kg/m.  General: A&O x 3, WDWN.  Gait: slow, deliberate, mildly antalgic  Eyes: PERRLA Pulmonary: Respirations are non labored, slightly limited air movement, without wheezes , rales or rhonchi.  Cardiac: regular rhythm, no detected murmur.   Carotid Bruits  Left  Right    Negative  Negative    Aorta is not palpable.  Bilateral radial pulses are palpable: 1+ left, 2+ right.   VASCULAR EXAM:  Extremities without ischemic changes  without Gangrene; without open wounds.Trace bilateral ankle pitting edema.   LE Pulses  LEFT  RIGHT   FEMORAL  2+ palpable  1+palpable   POPLITEAL  not palpable  not palpable   POSTERIOR TIBIAL  not palpable  not palpable   DORSALIS PEDIS  ANTERIOR TIBIAL  not palpable  Not palpable    Abdomen: soft, NT, no masses palpated.  Skin: no rashes, no ulcers.  Musculoskeletal: no muscle wasting or atrophy  Neurologic: A&O X 3; Appropriate Affect ; SENSATION: normal; MOTOR FUNCTION: moving all extremities equally, M/S 4/5 throughout. Speech is fluent/slow.  CN 2-12 intact.    01/03/16 CTA abd/pelvis requested by Dr. Oneida Alar, gastroenterologist, for pt weight loss: Vascular/Lymphatic: There is abdominal aortic atherosclerosis without aneurysm. Portal vein and superior mesenteric vein are patent. Atherosclerotic plaque noted at the origin of the celiac axis and SMA, but both vessels opacified. There is no gastrohepatic or hepatoduodenal ligament lymphadenopathy. No intraperitoneal or retroperitoneal lymphadenopathy.    Non-Invasive Vascular Imaging: DATE: 01/17/2016 ABI:  RIGHT: 0.37 (0.52, 10/20/15),  Waveforms: absent PT, monophasic DP; TBI waveforms are dampened   LEFT: 0.41 (0.54), Waveforms: monophasic; TBI: 0.34  ASSESSMENT: Charlotte Henry is a 75 y.o. female who has known bilateral infrainguinal arterial occlusive disease, also has a history os a TIA in 2011. Carotid Duplex in April, 2015 demonstrated minimal bilateral ICA stenosis.  She returns today with c/o about 1.5 weeks history of bilateral feet numbness, right worse than left. She seems to have mild pain in her right foot with ambulation. There are no signs of ischemia in her feet/legs.  ABI's today have decreased bilaterally: right: from 52% to 37% arterial perfusion which is critical limb ischemia; left decreased form 54% to 41% perfusion (moderate arterial occlusive disease). Unfortunately she continues to smoke. Her DM is currently in good control with er most recent A1C at 6.4. She takes a daily statin and ASA. Creatinine on 12/15/15 was 0.97.   PLAN:  Based on the patient's vascular studies and examination, and after discussing with Dr. Trula Woodhams, and after Dr. Trula Gelardi viewed pt's images from her 01/03/16 CTA abd/pelvis, pt will be scheduled for arteriogram with bilateral run off by Dr. Scot Dock on Monday, 01/24/16.  The patient was counseled re smoking cessation and given several free resources re smoking cessation.  I discussed in depth with the patient the nature of atherosclerosis, and emphasized the importance of maximal medical management including strict control of blood pressure, blood glucose, and lipid levels, obtaining regular exercise, and cessation of smoking.  The patient is aware that without maximal medical management the underlying atherosclerotic disease process will progress, limiting the benefit of any interventions.  The patient was given information about PAD including signs, symptoms, treatment, what symptoms should prompt the patient to seek immediate medical care, and risk reduction measures to  take.  Clemon Chambers, RN, MSN, FNP-C [facility] Phone: [phone]  Clinic MD: Trula Town  01/17/16 3:17 PM

## 2016-01-17 NOTE — Patient Instructions (Signed)
Peripheral Vascular Disease Peripheral vascular disease (PVD) is a disease of the blood vessels that are not part of your heart and brain. A simple term for PVD is poor circulation. In most cases, PVD narrows the blood vessels that carry blood from your heart to the rest of your body. This can result in a decreased supply of blood to your arms, legs, and internal organs, like your stomach or kidneys. However, it most often affects a person's lower legs and feet. There are two types of PVD.  Organic PVD. This is the more common type. It is caused by damage to the structure of blood vessels.  Functional PVD. This is caused by conditions that make blood vessels contract and tighten (spasm). Without treatment, PVD tends to get worse over time. PVD can also lead to acute ischemic limb. This is when an arm or limb suddenly has trouble getting enough blood. This is a medical emergency. CAUSES Each type of PVD has many different causes. The most common cause of PVD is buildup of a fatty material (plaque) inside of your arteries (atherosclerosis). Small amounts of plaque can break off from the walls of the blood vessels and become lodged in a smaller artery. This blocks blood flow and can cause acute ischemic limb. Other common causes of PVD include:  Blood clots that form inside of blood vessels.  Injuries to blood vessels.  Diseases that cause inflammation of blood vessels or cause blood vessel spasms.  Health behaviors and health history that increase your risk of developing PVD. RISK FACTORS  You may have a greater risk of PVD if you:  Have a family history of PVD.  Have certain medical conditions, including:  High cholesterol.  Diabetes.  High blood pressure (hypertension).  Coronary heart disease.  Past problems with blood clots.  Past injury, such as burns or a broken bone. These may have damaged blood vessels in your limbs.  Buerger disease. This is caused by inflamed blood  vessels in your hands and feet.  Some forms of arthritis.  Rare birth defects that affect the arteries in your legs.  Use tobacco.  Do not get enough exercise.  Are obese.  Are age 50 or older. SIGNS AND SYMPTOMS  PVD may cause many different symptoms. Your symptoms depend on what part of your body is not getting enough blood. Some common signs and symptoms include:  Cramps in your lower legs. This may be a symptom of poor leg circulation (claudication).  Pain and weakness in your legs while you are physically active that goes away when you rest (intermittent claudication).  Leg pain when at rest.  Leg numbness, tingling, or weakness.  Coldness in a leg or foot, especially when compared with the other leg.  Skin or hair changes. These can include:  Hair loss.  Shiny skin.  Pale or bluish skin.  Thick toenails.  Inability to get or maintain an erection (erectile dysfunction). People with PVD are more prone to developing ulcers and sores on their toes, feet, or legs. These may take longer than normal to heal. DIAGNOSIS Your health care provider may diagnose PVD from your signs and symptoms. The health care provider will also do a physical exam. You may have tests to find out what is causing your PVD and determine its severity. Tests may include:  Blood pressure recordings from your arms and legs and measurements of the strength of your pulses (pulse volume recordings).  Imaging studies using sound waves to take pictures of   the blood flow through your blood vessels (Doppler ultrasound).  Injecting a dye into your blood vessels before having imaging studies using:  X-rays (angiogram or arteriogram).  Computer-generated X-rays (CT angiogram).  A powerful electromagnetic field and a computer (magnetic resonance angiogram or MRA). TREATMENT Treatment for PVD depends on the cause of your condition and the severity of your symptoms. It also depends on your age. Underlying  causes need to be treated and controlled. These include long-lasting (chronic) conditions, such as diabetes, high cholesterol, and high blood pressure. You may need to first try making lifestyle changes and taking medicines. Surgery may be needed if these do not work. Lifestyle changes may include:  Quitting smoking.  Exercising regularly.  Following a low-fat, low-cholesterol diet. Medicines may include:  Blood thinners to prevent blood clots.  Medicines to improve blood flow.  Medicines to improve your blood cholesterol levels. Surgical procedures may include:  A procedure that uses an inflated balloon to open a blocked artery and improve blood flow (angioplasty).  A procedure to put in a tube (stent) to keep a blocked artery open (stent implant).  Surgery to reroute blood flow around a blocked artery (peripheral bypass surgery).  Surgery to remove dead tissue from an infected wound on the affected limb.  Amputation. This is surgical removal of the affected limb. This may be necessary in cases of acute ischemic limb that are not improved through medical or surgical treatments. HOME CARE INSTRUCTIONS  Take medicines only as directed by your health care provider.  Do not use any tobacco products, including cigarettes, chewing tobacco, or electronic cigarettes. If you need help quitting, ask your health care provider.  Lose weight if you are overweight, and maintain a healthy weight as directed by your health care provider.  Eat a diet that is low in fat and cholesterol. If you need help, ask your health care provider.  Exercise regularly. Ask your health care provider to suggest some good activities for you.  Use compression stockings or other mechanical devices as directed by your health care provider.  Take good care of your feet.  Wear comfortable shoes that fit well.  Check your feet often for any cuts or sores. SEEK MEDICAL CARE IF:  You have cramps in your legs  while walking.  You have leg pain when you are at rest.  You have coldness in a leg or foot.  Your skin changes.  You have erectile dysfunction.  You have cuts or sores on your feet that are not healing. SEEK IMMEDIATE MEDICAL CARE IF:  Your arm or leg turns cold and blue.  Your arms or legs become red, warm, swollen, painful, or numb.  You have chest pain or trouble breathing.  You suddenly have weakness in your face, arm, or leg.  You become very confused or lose the ability to speak.  You suddenly have a very bad headache or lose your vision.   This information is not intended to replace advice given to you by your health care provider. Make sure you discuss any questions you have with your health care provider.   Document Released: 06/29/2004 Document Revised: 06/12/2014 Document Reviewed: 10/30/2013 Elsevier Interactive Patient Education 2016 Elsevier Inc.     Steps to Quit Smoking  Smoking tobacco can be harmful to your health and can affect almost every organ in your body. Smoking puts you, and those around you, at risk for developing many serious chronic diseases. Quitting smoking is difficult, but it is one of   the best things that you can do for your health. It is never too late to quit. WHAT ARE THE BENEFITS OF QUITTING SMOKING? When you quit smoking, you lower your risk of developing serious diseases and conditions, such as:  Lung cancer or lung disease, such as COPD.  Heart disease.  Stroke.  Heart attack.  Infertility.  Osteoporosis and bone fractures. Additionally, symptoms such as coughing, wheezing, and shortness of breath may get better when you quit. You may also find that you get sick less often because your body is stronger at fighting off colds and infections. If you are pregnant, quitting smoking can help to reduce your chances of having a baby of low birth weight. HOW DO I GET READY TO QUIT? When you decide to quit smoking, create a plan to  make sure that you are successful. Before you quit:  Pick a date to quit. Set a date within the next two weeks to give you time to prepare.  Write down the reasons why you are quitting. Keep this list in places where you will see it often, such as on your bathroom mirror or in your car or wallet.  Identify the people, places, things, and activities that make you want to smoke (triggers) and avoid them. Make sure to take these actions:  Throw away all cigarettes at home, at work, and in your car.  Throw away smoking accessories, such as ashtrays and lighters.  Clean your car and make sure to empty the ashtray.  Clean your home, including curtains and carpets.  Tell your family, friends, and coworkers that you are quitting. Support from your loved ones can make quitting easier.  Talk with your health care provider about your options for quitting smoking.  Find out what treatment options are covered by your health insurance. WHAT STRATEGIES CAN I USE TO QUIT SMOKING?  Talk with your healthcare provider about different strategies to quit smoking. Some strategies include:  Quitting smoking altogether instead of gradually lessening how much you smoke over a period of time. Research shows that quitting "cold turkey" is more successful than gradually quitting.  Attending in-person counseling to help you build problem-solving skills. You are more likely to have success in quitting if you attend several counseling sessions. Even short sessions of 10 minutes can be effective.  Finding resources and support systems that can help you to quit smoking and remain smoke-free after you quit. These resources are most helpful when you use them often. They can include:  Online chats with a counselor.  Telephone quitlines.  Printed self-help materials.  Support groups or group counseling.  Text messaging programs.  Mobile phone applications.  Taking medicines to help you quit smoking. (If you are  pregnant or breastfeeding, talk with your health care provider first.) Some medicines contain nicotine and some do not. Both types of medicines help with cravings, but the medicines that include nicotine help to relieve withdrawal symptoms. Your health care provider may recommend:  Nicotine patches, gum, or lozenges.  Nicotine inhalers or sprays.  Non-nicotine medicine that is taken by mouth. Talk with your health care provider about combining strategies, such as taking medicines while you are also receiving in-person counseling. Using these two strategies together makes you more likely to succeed in quitting than if you used either strategy on its own. If you are pregnant or breastfeeding, talk with your health care provider about finding counseling or other support strategies to quit smoking. Do not take medicine to help you   quit smoking unless told to do so by your health care provider. WHAT THINGS CAN I DO TO MAKE IT EASIER TO QUIT? Quitting smoking might feel overwhelming at first, but there is a lot that you can do to make it easier. Take these important actions:  Reach out to your family and friends and ask that they support and encourage you during this time. Call telephone quitlines, reach out to support groups, or work with a counselor for support.  Ask people who smoke to avoid smoking around you.  Avoid places that trigger you to smoke, such as bars, parties, or smoke-break areas at work.  Spend time around people who do not smoke.  Lessen stress in your life, because stress can be a smoking trigger for some people. To lessen stress, try:  Exercising regularly.  Deep-breathing exercises.  Yoga.  Meditating.  Performing a body scan. This involves closing your eyes, scanning your body from head to toe, and noticing which parts of your body are particularly tense. Purposefully relax the muscles in those areas.  Download or purchase mobile phone or tablet apps (applications)  that can help you stick to your quit plan by providing reminders, tips, and encouragement. There are many free apps, such as QuitGuide from the CDC (Centers for Disease Control and Prevention). You can find other support for quitting smoking (smoking cessation) through smokefree.gov and other websites. HOW WILL I FEEL WHEN I QUIT SMOKING? Within the first 24 hours of quitting smoking, you may start to feel some withdrawal symptoms. These symptoms are usually most noticeable 2-3 days after quitting, but they usually do not last beyond 2-3 weeks. Changes or symptoms that you might experience include:  Mood swings.  Restlessness, anxiety, or irritation.  Difficulty concentrating.  Dizziness.  Strong cravings for sugary foods in addition to nicotine.  Mild weight gain.  Constipation.  Nausea.  Coughing or a sore throat.  Changes in how your medicines work in your body.  A depressed mood.  Difficulty sleeping (insomnia). After the first 2-3 weeks of quitting, you may start to notice more positive results, such as:  Improved sense of smell and taste.  Decreased coughing and sore throat.  Slower heart rate.  Lower blood pressure.  Clearer skin.  The ability to breathe more easily.  Fewer sick days. Quitting smoking is very challenging for most people. Do not get discouraged if you are not successful the first time. Some people need to make many attempts to quit before they achieve long-term success. Do your best to stick to your quit plan, and talk with your health care provider if you have any questions or concerns.   This information is not intended to replace advice given to you by your health care provider. Make sure you discuss any questions you have with your health care provider.   Document Released: 05/16/2001 Document Revised: 10/06/2014 Document Reviewed: 10/06/2014 Elsevier Interactive Patient Education 2016 Elsevier Inc.  

## 2016-01-18 ENCOUNTER — Other Ambulatory Visit: Payer: Self-pay | Admitting: *Deleted

## 2016-01-24 ENCOUNTER — Other Ambulatory Visit: Payer: Self-pay | Admitting: Cardiovascular Disease

## 2016-01-24 ENCOUNTER — Encounter (HOSPITAL_COMMUNITY): Payer: Self-pay | Admitting: *Deleted

## 2016-01-24 ENCOUNTER — Ambulatory Visit (HOSPITAL_COMMUNITY)
Admission: RE | Admit: 2016-01-24 | Discharge: 2016-01-24 | Disposition: A | Discharge status: Home / Self Care | Payer: Commercial Managed Care - HMO | Source: Ambulatory Visit | Attending: Vascular Surgery | Admitting: Vascular Surgery

## 2016-01-24 ENCOUNTER — Encounter (HOSPITAL_COMMUNITY)
Admission: RE | Disposition: A | Discharge status: Home / Self Care | Payer: Self-pay | Source: Ambulatory Visit | Attending: Vascular Surgery

## 2016-01-24 DIAGNOSIS — I4510 Unspecified right bundle-branch block: Secondary | ICD-10-CM | POA: Insufficient documentation

## 2016-01-24 DIAGNOSIS — I739 Peripheral vascular disease, unspecified: Secondary | ICD-10-CM | POA: Insufficient documentation

## 2016-01-24 DIAGNOSIS — R001 Bradycardia, unspecified: Secondary | ICD-10-CM | POA: Insufficient documentation

## 2016-01-24 DIAGNOSIS — I441 Atrioventricular block, second degree: Secondary | ICD-10-CM | POA: Diagnosis not present

## 2016-01-24 DIAGNOSIS — Z539 Procedure and treatment not carried out, unspecified reason: Secondary | ICD-10-CM | POA: Diagnosis not present

## 2016-01-24 LAB — POCT I-STAT, CHEM 8
BUN: 24 mg/dL — ABNORMAL HIGH (ref 6–20)
CALCIUM ION: 1.21 mmol/L (ref 1.12–1.23)
CHLORIDE: 106 mmol/L (ref 101–111)
CREATININE: 1 mg/dL (ref 0.44–1.00)
GLUCOSE: 100 mg/dL — AB (ref 65–99)
HCT: 34 % — ABNORMAL LOW (ref 36.0–46.0)
Hemoglobin: 11.6 g/dL — ABNORMAL LOW (ref 12.0–15.0)
Potassium: 3.7 mmol/L (ref 3.5–5.1)
Sodium: 143 mmol/L (ref 135–145)
TCO2: 26 mmol/L (ref 0–100)

## 2016-01-24 SURGERY — ABDOMINAL AORTOGRAM W/LOWER EXTREMITY

## 2016-01-24 MED ORDER — SODIUM CHLORIDE 0.9% FLUSH: 3.0000 mL | INTRAVENOUS | Status: DC | PRN

## 2016-01-24 MED ORDER — SODIUM CHLORIDE 0.9 % IV SOLN: 250.0000 mL | INTRAVENOUS | Status: DC | PRN

## 2016-01-24 MED ORDER — SODIUM CHLORIDE 0.9% FLUSH: 3.0000 mL | Freq: Two times a day (BID) | INTRAVENOUS | Status: DC

## 2016-01-24 NOTE — Progress Notes (Signed)
   VASCULAR SURGERY NOTE:  This is a 6 or old woman who I had seen in the office on 10/20/2015 with peripheral vascular disease. At that time she was asymptomatic. At that time, ABI on the right was 52%. ABI on the left was 54%. She was she was subsequent least seen in our office by the nurse practitioner and Dr. Trula Player. He reviewed her CT scan and recommended that she undergo arteriography. She was set up for an arteriogram.  hher EKG today shows a sinus bradycardia (45) with second-degree AV block and a right bundle branch block. An anterior infarct could not be ruled out. She is a symptom management from a cardiac standpoint and denies any chest pain.  On my history the patient describes pain in her legs and feet with ambulation and also with standing. She states that her symptoms are relieved with leg elevation. Therefore not convinced that her symptoms can be attributed to her infrainguinal arterial occlusive disease.  All things considered, given that I'm not convinced her symptoms can be treated to her infrainguinal arterial occlusive disease, and given her EKG findings I have canceled her procedure today.  I have recommended follow up with her primary care physician and also with her cardiologist. Given that I do not think her symptoms in her legs can all be attributed to her peripheral vascular disease she may need evaluation of her back for degenerative disc disease of her back which might potentially cause her symptoms in her legs.  SUBJECTIVE: Denies chest pain.  PHYSICAL EXAM: Vitals:   01/24/16 0902  BP: (!) 141/67  Resp: 20  Temp: 98 F (36.7 C)  TempSrc: Oral  SpO2: 100%  Weight: 155 lb (70.3 kg)  Height: '5\' 7"'$  (1.702 m)   Palpable femoral pulses.  LABS: Lab Results  Component Value Date   WBC 6.2 12/15/2015   HGB 11.6 (L) 01/24/2016   HCT 34.0 (L) 01/24/2016   MCV 92.4 12/15/2015   PLT 236 12/15/2015   Lab Results  Component Value Date   CREATININE 1.00  01/24/2016    Gae Gallop Beeper: [phone] 01/24/2016

## 2016-01-25 ENCOUNTER — Ambulatory Visit: Payer: Commercial Managed Care - HMO | Admitting: Internal Medicine

## 2016-02-02 ENCOUNTER — Encounter: Payer: Self-pay | Admitting: Gastroenterology

## 2016-02-10 ENCOUNTER — Encounter: Payer: Self-pay | Admitting: Cardiovascular Disease

## 2016-02-10 ENCOUNTER — Encounter: Payer: Self-pay | Admitting: *Deleted

## 2016-02-10 ENCOUNTER — Telehealth: Payer: Self-pay | Admitting: Family Medicine

## 2016-02-10 ENCOUNTER — Ambulatory Visit (INDEPENDENT_AMBULATORY_CARE_PROVIDER_SITE_OTHER): Payer: Commercial Managed Care - HMO | Admitting: Cardiovascular Disease

## 2016-02-10 VITALS — BP 142/64 | HR 57 | Ht 67.0 in | Wt 151.0 lb

## 2016-02-10 DIAGNOSIS — I441 Atrioventricular block, second degree: Secondary | ICD-10-CM

## 2016-02-10 DIAGNOSIS — R001 Bradycardia, unspecified: Secondary | ICD-10-CM

## 2016-02-10 DIAGNOSIS — I6523 Occlusion and stenosis of bilateral carotid arteries: Secondary | ICD-10-CM

## 2016-02-10 DIAGNOSIS — I2510 Atherosclerotic heart disease of native coronary artery without angina pectoris: Secondary | ICD-10-CM

## 2016-02-10 DIAGNOSIS — I739 Peripheral vascular disease, unspecified: Secondary | ICD-10-CM | POA: Diagnosis not present

## 2016-02-10 DIAGNOSIS — R5383 Other fatigue: Secondary | ICD-10-CM

## 2016-02-10 DIAGNOSIS — I25118 Atherosclerotic heart disease of native coronary artery with other forms of angina pectoris: Secondary | ICD-10-CM

## 2016-02-10 DIAGNOSIS — I10 Essential (primary) hypertension: Secondary | ICD-10-CM

## 2016-02-10 DIAGNOSIS — E785 Hyperlipidemia, unspecified: Secondary | ICD-10-CM

## 2016-02-10 NOTE — Progress Notes (Signed)
SUBJECTIVE: The patient is a 75 year old woman with a history of coronary artery disease and peripheral arterial disease. She underwent drug-eluting stent placement to the LAD and RCA in 2004. She also has a history of diabetes mellitus, tobacco abuse, hypertension, irritable bowel syndrome, GERD, and hyperlipidemia.   Asked to evaluate by Dr. Scot Dock for abnormal ECG.  ECG 01/24/16 showed sinus bradycardia, RBBB, and Mobitz I AV block (Wenckebach).  Denies chest pain, palps, SOB, dizziness, and syncope.  Continues to c/o fatigue.  Tries to stay active.  Here with her daughter, Anderson Malta.   Review of Systems: As per "subjective", otherwise negative.  Allergies  Allergen Reactions  . Ace Inhibitors Hives, Shortness Of Breath and Swelling  . Aspirin Other (See Comments)    Blood in stool and nose bleed  . Codeine Swelling  . Pineapple Shortness Of Breath and Swelling  . Plasticized Base [Plastibase] Hives and Swelling  . Ultram [Tramadol] Swelling  . Adhesive [Tape] Swelling and Rash  . Naprosyn [Naproxen] Swelling  . Clindamycin/Lincomycin Other (See Comments)    Burning sensation throat, abdomen  . Linzess [Linaclotide]     DIARRHEA, FECAL INCONTINENCE  . Nsaids   . Penicillins     Lip swelling, Hives Has patient had a PCN reaction causing immediate rash, facial/tongue/throat swelling, SOB or lightheadedness with hypotension:YES Has patient had a PCN reaction causing severe rash involving mucus membranes or skin necrosis: NO Has patient had a PCN reaction that required hospitalization NO Has patient had a PCN reaction occurring within the last 10 years: NO If all of the above answers are "NO", then may proceed with Cephalosporin use.  . Varenicline Tartrate Swelling    Current Outpatient Prescriptions  Medication Sig Dispense Refill  . albuterol (PROVENTIL HFA;VENTOLIN HFA) 108 (90 Base) MCG/ACT inhaler Inhale 2 puffs into the lungs every 4 (four) hours as needed  for wheezing or shortness of breath. 1 Inhaler 2  . ALPRAZolam (XANAX) 0.25 MG tablet TAKE 1 TABLET BY MOUTH AT BEDTIME AS NEEDED. 30 tablet 1  . aspirin 81 MG tablet Take 81 mg by mouth daily.      . calcium citrate-vitamin D (CITRACAL+D) 315-200 MG-UNIT per tablet Take 1 tablet by mouth daily.      . Cranberry 250 MG CAPS Take 250 mg by mouth daily.    Marland Kitchen EPINEPHrine 0.3 mg/0.3 mL IJ SOAJ injection Inject 0.3 mLs (0.3 mg total) into the muscle once. 1 Device 3  . esomeprazole (NEXIUM) 40 MG capsule Take 1 capsule (40 mg total) by mouth daily. 30 capsule 3  . Fluticasone-Salmeterol (ADVAIR) 100-50 MCG/DOSE AEPB Inhale 1 puff into the lungs 2 (two) times daily. 1 each 3  . lidocaine-prilocaine (EMLA) cream     . lipase/protease/amylase (CREON) 12000 units CPEP capsule 1 with meals and snacks. 270 capsule 5  . lubiprostone (AMITIZA) 8 MCG capsule Take 1 capsule (8 mcg total) by mouth 2 (two) times daily with a meal. 60 capsule 1  . mirabegron ER (MYRBETRIQ) 25 MG TB24 tablet Take 1 tablet (25 mg total) by mouth at bedtime. For bladder 30 tablet 6  . mirtazapine (REMERON) 7.5 MG tablet 1 PO QHS 30 tablet 5  . Polyvinyl Alcohol-Povidone (REFRESH OP) Place 1 drop into the right eye daily as needed. For swelling and redness in right eye    . pravastatin (PRAVACHOL) 40 MG tablet TAKE 1 TABLET BY MOUTH ONCE DAILY. 90 tablet 0  . Probiotic Product (PROBIOTIC DAILY PO) Take by  mouth daily.    . Tiotropium Bromide Monohydrate (SPIRIVA RESPIMAT) 1.25 MCG/ACT AERS Inhale 2 puffs into the lungs daily. 1 Inhaler 11  . valsartan (DIOVAN) 160 MG tablet TAKE 1 TABLET BY MOUTH DAILY. 90 tablet 0  . ZETIA 10 MG tablet TAKE 1 TABLET BY MOUTH ONCE A DAY. 30 tablet 0   No current facility-administered medications for this visit.     Past Medical History:  Diagnosis Date  . Anemia   . 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 Arthritis   . ASCVD (arteriosclerotic cardiovascular disease)    Neg. stress nuc.  3/99; cath in 4/04,-80%LAD; 70% RCA, -> DESx2; residual 60% distal RCA; nl EF  . Asthmatic bronchitis   . Carotid artery occlusion   . Cerebrovascular disease     mild; <50% ICA with moderate plaque bilaterally in 12/2009  . CHF (congestive heart failure) ([place])   . Chronic kidney disease (CKD), stage III (moderate)    Creat.= 1.78 in 11/2009  . Clostridium difficile colitis OCT 2014  . Coronary disease   . Diabetes mellitus    Treated with oral agents; HgbA1c of 7in 08/2008  . Diverticulosis   . GERD (gastroesophageal reflux disease)   . Gout   . Hyperkalemia    potassium of 5.1-5.3 in 2011  . Hyperlipidemia   . Hypertension   . Myocardial infarction ([place])   . Nervousness(799.21)   . PVD (peripheral vascular disease) ([place])     moderate to severely decreased ABI's in 8/08, sig. aortic inflow disease; repeat ABI's in 2011 improved- mild disease on the left and moderate to severe disease on the right  . Stroke [facility]) 2012   Mini strokes- Temporary  blindness  . Tobacco abuse    50 pack years continuing at 1/2 pack per day    Past Surgical History:  Procedure Laterality Date  . ABDOMINAL HYSTERECTOMY    . carpel tunnel release     bilateral  . CHOLECYSTECTOMY    . COLONOSCOPY  2009  . TOTAL ABDOMINAL HYSTERECTOMY W/ BILATERAL SALPINGOOPHORECTOMY      Social History   Social History  . Marital status: Widowed    Spouse name: N/A  . Number of children: N/A  . Years of education: N/A   Occupational History  . Not on file.   Social History Main Topics  . Smoking status: Current Every Day Smoker    Packs/day: 0.50    Years: 50.00    Types: Cigarettes    Start date: 02/02/1966  . Smokeless tobacco: Never Used     Comment: 1/2 pack daily  . Alcohol use No  . Drug use: No  . Sexual activity: Not Currently   Other Topics Concern  . Not on file   Social History Narrative  . No narrative on file     Vitals:   02/10/16 1356  BP: (!) 142/64  Pulse: (!) 57  SpO2:  97%  Weight: 151 lb (68.5 kg)  Height: '5\' 7"'$  (1.702 m)    PHYSICAL EXAM General: NAD  Neck: No JVD, no thyromegaly.  Lungs: Diminished without rales or wheezes. CV: Nondisplaced PMI. Bradycardic, regular rhythm, normal S1/S2, no A2/N0, I/VI systolic murmur along left sternal border. No pretibial or periankle edema.  Abdomen: Soft, nontender, no distention.  Neurologic: Alert and oriented x 3.  Psych: Somewhat flat affect. Skin: Normal. Musculoskeletal: No gross deformities. Extremities: No clubbing or cyanosis.     ECG: Most recent ECG reviewed.  ASSESSMENT AND PLAN: 1. CAD with fatigue: Due to fatigue, will obtain Lexiscan Myoview. Continue aspirin, Zetia, and pravastatin.   2. Essential HTN: Mildly elevated. No changes.  3. Hyperlipidemia: No change in current therapy which includes Zetia and pravastatin.   4. Bradycardia with Mobitz I AV block and fatigue: Will obtain 1 week event monitor and nuclear MPI study. Not on AV nodal blocking agents.  5. Peripheral vascular disease: Followed by vascular surgery.  6. Tobacco abuse disorder  Dispo: f/u 2 months.  Kate Sable, M.D., F.A.C.C.

## 2016-02-10 NOTE — Patient Instructions (Signed)
Your physician recommends that you schedule a follow-up appointment in: 2 Months with Dr. Bronson Ing  Your physician recommends that you continue on your current medications as directed. Please refer to the Current Medication list given to you today.  Your physician has requested that you have a lexiscan myoview. For further information please visit [email or web address]. Please follow instruction sheet, as given.  Your physician has recommended that you wear an event monitor. Event monitors are medical devices that record the heart's electrical activity. Doctors most often [place] these monitors to diagnose arrhythmias. Arrhythmias are problems with the speed or rhythm of the heartbeat. The monitor is a small, portable device. You can wear one while you do your normal daily activities. This is usually used to diagnose what is causing palpitations/syncope (passing out).  If you need a refill on your cardiac medications before your next appointment, please call your pharmacy.  Thank you for choosing [facility]!

## 2016-02-10 NOTE — Telephone Encounter (Signed)
Needs [employer] referral for follow up visits with Dr Jacinta Shoe.  Approved auth # [id number] 6 visits from 02/10/16 - 08/08/16 for multiple diagnoses

## 2016-02-11 ENCOUNTER — Ambulatory Visit (INDEPENDENT_AMBULATORY_CARE_PROVIDER_SITE_OTHER): Payer: Commercial Managed Care - HMO | Admitting: Family Medicine

## 2016-02-11 ENCOUNTER — Encounter: Payer: Self-pay | Admitting: Family Medicine

## 2016-02-11 VITALS — BP 138/74 | HR 66 | Temp 98.6°F | Resp 16 | Ht 67.0 in | Wt 152.0 lb

## 2016-02-11 DIAGNOSIS — E785 Hyperlipidemia, unspecified: Secondary | ICD-10-CM | POA: Diagnosis not present

## 2016-02-11 DIAGNOSIS — I10 Essential (primary) hypertension: Secondary | ICD-10-CM

## 2016-02-11 DIAGNOSIS — I739 Peripheral vascular disease, unspecified: Secondary | ICD-10-CM

## 2016-02-11 NOTE — Progress Notes (Signed)
   Subjective:    Patient ID: Charlotte Henry, female    DOB: 31-Aug-1940, 75 y.o.   MRN: [id number]  Patient presents for Follow-up (vascular appointment- is fasting)  Pt here to discuss recent appt, seen by vascular for known PVD and ongoing leg pain tiredness, had ABI which showed decrease in flow. PA consulted with physician in office who recommended arteriogram. She presented for arteriogram and during pre-op had abnormal EKG showed AV block, her actual vascular surgeon Dr. Scot Dock, evaluated her and felt her symptoms may be due to a back problem and instead of vascular problems and cancelled arteriogram.  She is quite upset about this. She denies any back pain , no radiating symptoms, her pain starts below knees down to feet. Her daughter is also here today.  They were seen by cardiology who found  2nd degree AV block and with history of CAD and stent has dedicded to place 1 week monitor and her and do stress myoview.  Based on our conversation she would like to have another vascular opinion  On review of meds she stopped creon again stopped her pravastatin. States she is taking probiotics   Review Of Systems:  GEN- denies fatigue, fever, weight loss,weakness, recent illness HEENT- denies eye drainage, change in vision, nasal discharge, CVS- denies chest pain, palpitations RESP- denies SOB, cough, wheeze ABD- denies N/V, change in stools, abd pain GU- denies dysuria, hematuria, dribbling, incontinence MSK- + joint pain,+ muscle aches, injury Neuro- denies headache, dizziness, syncope, seizure activity       Objective:    BP 138/74 (BP Location: Left Arm, Patient Position: Sitting, Cuff Size: Normal)   Pulse 66   Temp 98.6 F (37 C) (Oral)   Resp 16   Ht '5\' 7"'$  (1.702 m)   Wt 152 lb (68.9 kg)   BMI 23.81 kg/m  GEN- NAD, alert and oriented x3 CVS- RRR, soft systolic murmur  RESP-CTAB ABD-NABS,soft,NT,ND MSK- Spine NT, decreased ROM, neg SLR , decreased ROM HIPS/KNEE EXT-  No edema Pulses- Radial 2+, DP- decreased +        Assessment & Plan:      Problem List Items Addressed This Visit    PVD (peripheral vascular disease) ([place])    She has known PVD , neuropathy, difficult situation. 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 Then re-evaluate whether to proceed with 2nd opion about vascular disease or MRI spine       Hyperlipidemia - Primary    Discussed importance of statin, she has not had any change in leg symptoms off statins. Has CAD, PVD , she agrees to labs and discussion of a lower dose of medication      Relevant Orders   CBC with Differential/Platelet (Completed)   Comprehensive metabolic panel (Completed)   Lipid panel (Completed)   Essential hypertension    Controlled       Relevant Orders   CBC with Differential/Platelet (Completed)   Back pain    Other Visit Diagnoses   None.     Note: This dictation was prepared with Dragon dictation along with smaller phrase technology. Any transcriptional errors that result from this process are unintentional.

## 2016-02-11 NOTE — Patient Instructions (Addendum)
We will call with lab results F/U END OF Oct

## 2016-02-12 ENCOUNTER — Encounter: Payer: Self-pay | Admitting: Family Medicine

## 2016-02-12 LAB — LIPID PANEL
Cholesterol: 122 mg/dL — ABNORMAL LOW (ref 125–200)
HDL: 49 mg/dL (ref >=46)
LDL CALC: 52 mg/dL (ref <130)
TRIGLYCERIDES: 107 mg/dL (ref <150)
Total CHOL/HDL Ratio: 2.5 Ratio (ref <=5.0)
VLDL: 21 mg/dL (ref <30)

## 2016-02-12 LAB — COMPREHENSIVE METABOLIC PANEL
ALBUMIN: 4 g/dL (ref 3.6–5.1)
ALT: 14 U/L (ref 6–29)
AST: 24 U/L (ref 10–35)
Alkaline Phosphatase: 44 U/L (ref 33–130)
BILIRUBIN TOTAL: 0.4 mg/dL (ref 0.2–1.2)
BUN: 16 mg/dL (ref 7–25)
CO2: 24 mmol/L (ref 20–31)
CREATININE: 0.89 mg/dL (ref 0.60–0.93)
Calcium: 9.4 mg/dL (ref 8.6–10.4)
Chloride: 107 mmol/L (ref 98–110)
Glucose, Bld: 89 mg/dL (ref 70–99)
Potassium: 4.7 mmol/L (ref 3.5–5.3)
SODIUM: 141 mmol/L (ref 135–146)
TOTAL PROTEIN: 6.9 g/dL (ref 6.1–8.1)

## 2016-02-12 LAB — CBC WITH DIFFERENTIAL/PLATELET
BASOS PCT: 0 %
Basophils Absolute: 0 cells/uL (ref 0–200)
EOS PCT: 3 %
Eosinophils Absolute: 201 cells/uL (ref 15–500)
HCT: 36.8 % (ref 35.0–45.0)
HEMOGLOBIN: 12.1 g/dL (ref 12.0–15.0)
LYMPHS ABS: 3015 {cells}/uL (ref 850–3900)
Lymphocytes Relative: 45 %
MCH: 30 pg (ref 27.0–33.0)
MCHC: 32.9 g/dL (ref 32.0–36.0)
MCV: 91.1 fL (ref 80.0–100.0)
MONOS PCT: 9 %
MPV: 9.9 fL (ref 7.5–12.5)
Monocytes Absolute: 603 cells/uL (ref 200–950)
NEUTROS ABS: 2881 {cells}/uL (ref 1500–7800)
Neutrophils Relative %: 43 %
PLATELETS: 230 10*3/uL (ref 140–400)
RBC: 4.04 MIL/uL (ref 3.80–5.10)
RDW: 14.4 % (ref 11.0–15.0)
WBC: 6.7 10*3/uL (ref 3.8–10.8)

## 2016-02-12 NOTE — Assessment & Plan Note (Signed)
She has known PVD , neuropathy, difficult situation. 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 Then re-evaluate whether to proceed with 2nd opion about vascular disease or MRI spine

## 2016-02-12 NOTE — Assessment & Plan Note (Signed)
Controlled.  

## 2016-02-12 NOTE — Assessment & Plan Note (Signed)
Discussed importance of statin, she has not had any change in leg symptoms off statins. Has CAD, PVD , she agrees to labs and discussion of a lower dose of medication

## 2016-02-14 ENCOUNTER — Encounter (INDEPENDENT_AMBULATORY_CARE_PROVIDER_SITE_OTHER): Payer: Commercial Managed Care - HMO

## 2016-02-14 ENCOUNTER — Other Ambulatory Visit: Payer: Self-pay | Admitting: Family Medicine

## 2016-02-14 DIAGNOSIS — I2510 Atherosclerotic heart disease of native coronary artery without angina pectoris: Secondary | ICD-10-CM | POA: Diagnosis not present

## 2016-02-14 DIAGNOSIS — R001 Bradycardia, unspecified: Secondary | ICD-10-CM | POA: Diagnosis not present

## 2016-02-16 ENCOUNTER — Telehealth: Payer: Self-pay | Admitting: Physician Assistant

## 2016-02-16 NOTE — Telephone Encounter (Signed)
     I received a call from [employer] about possible 3rd degree heart block while the patient was sleeping around 5 am this AM. The patient was doing fine and asymptomatic. No syncope. Still fatigued. Strips were faxed to me and reviewed with Dr. Lovena Le who would like to see her in the office next week. She has been scheduled to see Dr. Lovena Le on 02/25/16 at [place] [facility]  [facility]

## 2016-02-17 ENCOUNTER — Other Ambulatory Visit: Payer: Self-pay | Admitting: *Deleted

## 2016-02-17 MED ORDER — PRAVASTATIN SODIUM 20 MG PO TABS: 20.0000 mg | ORAL_TABLET | Freq: Every day | ORAL | 3 refills | Status: DC

## 2016-02-23 ENCOUNTER — Encounter (HOSPITAL_COMMUNITY)
Admission: RE | Admit: 2016-02-23 | Discharge: 2016-02-23 | Disposition: A | Discharge status: Home / Self Care | Payer: Commercial Managed Care - HMO | Source: Ambulatory Visit | Attending: Cardiovascular Disease | Admitting: Cardiovascular Disease

## 2016-02-23 ENCOUNTER — Encounter (HOSPITAL_COMMUNITY): Payer: Self-pay

## 2016-02-23 ENCOUNTER — Inpatient Hospital Stay (HOSPITAL_COMMUNITY): Admission: RE | Admit: 2016-02-23 | Payer: Commercial Managed Care - HMO | Source: Ambulatory Visit

## 2016-02-23 DIAGNOSIS — I2510 Atherosclerotic heart disease of native coronary artery without angina pectoris: Secondary | ICD-10-CM | POA: Diagnosis not present

## 2016-02-23 MED ORDER — TECHNETIUM TC 99M TETROFOSMIN IV KIT
30.0000 | PACK | Freq: Once | INTRAVENOUS | Status: DC | PRN
Start: 2016-02-23 — End: 2016-02-29

## 2016-02-23 MED ORDER — SODIUM CHLORIDE 0.9% FLUSH
INTRAVENOUS | Status: AC
  Filled 2016-02-23: qty 10

## 2016-02-23 MED ORDER — REGADENOSON 0.4 MG/5ML IV SOLN
INTRAVENOUS | Status: AC
  Filled 2016-02-23: qty 5

## 2016-02-23 MED ORDER — TECHNETIUM TC 99M TETROFOSMIN IV KIT
10.0000 | PACK | Freq: Once | INTRAVENOUS | Status: AC | PRN
  Administered 2016-02-23: 11 via INTRAVENOUS

## 2016-02-25 ENCOUNTER — Ambulatory Visit (INDEPENDENT_AMBULATORY_CARE_PROVIDER_SITE_OTHER): Payer: Commercial Managed Care - HMO | Admitting: Internal Medicine

## 2016-02-25 ENCOUNTER — Encounter: Payer: Self-pay | Admitting: Internal Medicine

## 2016-02-25 ENCOUNTER — Encounter: Payer: Self-pay | Admitting: *Deleted

## 2016-02-25 VITALS — BP 112/64 | HR 59 | Ht 65.0 in | Wt 149.0 lb

## 2016-02-25 DIAGNOSIS — Z01818 Encounter for other preprocedural examination: Secondary | ICD-10-CM

## 2016-02-25 NOTE — Patient Instructions (Addendum)
Your physician has recommended that you have a pacemaker inserted. A pacemaker is a small device that is placed under the skin of your chest or abdomen to help control abnormal heart rhythms. This device uses electrical pulses to prompt the heart to beat at a normal rate. Pacemakers are used to treat heart rhythms that are too slow. Wire (leads) are attached to the pacemaker that goes into the chambers of you heart. This is done in the hospital and usually requires and overnight stay. Please see the instruction sheet given to you today for more information.  Your physician recommends that you return for lab work on 03/20/16  Your physician recommends that you continue on your current medications as directed. Please refer to the Current Medication list given to you today.  If you need a refill on your cardiac medications before your next appointment, please call your pharmacy.  Thank you for choosing [facility]!

## 2016-02-27 NOTE — Progress Notes (Signed)
HPI Charlotte Henry is referred today Dr. Raliegh Ip for evaluation of high grade heart block. She is a pleasant 75 yo woman with CAD, tobacco abuse and HTN. She has felt fatigued for several months. She denies chest pain. She tried to undergo exercise testing but her resting HR was in the 30's and the procedure cancelled. The patient is on no AV nodal blocking drugs. She has never had frank syncope. She has high grade daytime heart block with ventricular rates in the 35-45 range. No edema. No thyroid dysfunction symptoms. Allergies  Allergen Reactions  . Ace Inhibitors Hives, Shortness Of Breath and Swelling  . Aspirin Other (See Comments)    Blood in stool and nose bleed  . Codeine Swelling  . Pineapple Shortness Of Breath and Swelling  . Plasticized Base [Plastibase] Hives and Swelling  . Ultram [Tramadol] Swelling  . Adhesive [Tape] Swelling and Rash  . Naprosyn [Naproxen] Swelling  . Clindamycin/Lincomycin Other (See Comments)    Burning sensation throat, abdomen  . Linzess [Linaclotide]     DIARRHEA, FECAL INCONTINENCE  . Nsaids   . Penicillins     Lip swelling, Hives Has patient had a PCN reaction causing immediate rash, facial/tongue/throat swelling, SOB or lightheadedness with hypotension:YES Has patient had a PCN reaction causing severe rash involving mucus membranes or skin necrosis: NO Has patient had a PCN reaction that required hospitalization NO Has patient had a PCN reaction occurring within the last 10 years: NO If all of the above answers are "NO", then may proceed with Cephalosporin use.  . Varenicline Tartrate Swelling     Current Outpatient Prescriptions  Medication Sig Dispense Refill  . albuterol (PROVENTIL HFA;VENTOLIN HFA) 108 (90 Base) MCG/ACT inhaler Inhale 2 puffs into the lungs every 4 (four) hours as needed for wheezing or shortness of breath. 1 Inhaler 2  . ALPRAZolam (XANAX) 0.25 MG tablet TAKE 1 TABLET BY MOUTH AT BEDTIME AS NEEDED. 30 tablet 1  . aspirin  81 MG tablet Take 81 mg by mouth daily.      . calcium citrate-vitamin D (CITRACAL+D) 315-200 MG-UNIT per tablet Take 1 tablet by mouth daily.      . Cranberry 250 MG CAPS Take 250 mg by mouth daily.    . Cyanocobalamin (VITAMIN B 12 PO) Take by mouth.    . EPINEPHrine 0.3 mg/0.3 mL IJ SOAJ injection Inject 0.3 mLs (0.3 mg total) into the muscle once. 1 Device 3  . esomeprazole (NEXIUM) 40 MG capsule TAKE 1 CAPSULE BY MOUTH ONCE DAILY FOR STOMACH. 30 capsule 0  . Fluticasone-Salmeterol (ADVAIR) 100-50 MCG/DOSE AEPB Inhale 1 puff into the lungs 2 (two) times daily. 1 each 3  . lidocaine-prilocaine (EMLA) cream     . lipase/protease/amylase (CREON) 12000 units CPEP capsule 1 with meals and snacks. 270 capsule 5  . lubiprostone (AMITIZA) 8 MCG capsule Take 1 capsule (8 mcg total) by mouth 2 (two) times daily with a meal. 60 capsule 1  . mirabegron ER (MYRBETRIQ) 25 MG TB24 tablet Take 1 tablet (25 mg total) by mouth at bedtime. For bladder 30 tablet 6  . mirtazapine (REMERON) 7.5 MG tablet 1 PO QHS 30 tablet 5  . Polyvinyl Alcohol-Povidone (REFRESH OP) Place 1 drop into the right eye daily as needed. For swelling and redness in right eye    . pravastatin (PRAVACHOL) 20 MG tablet Take 1 tablet (20 mg total) by mouth daily. 30 tablet 3  . Probiotic Product (PROBIOTIC DAILY PO) Take by  mouth daily.    . Tiotropium Bromide Monohydrate (SPIRIVA RESPIMAT) 1.25 MCG/ACT AERS Inhale 2 puffs into the lungs daily. 1 Inhaler 11  . valsartan (DIOVAN) 160 MG tablet TAKE 1 TABLET BY MOUTH DAILY. 90 tablet 0  . ZETIA 10 MG tablet TAKE 1 TABLET BY MOUTH ONCE A DAY. 30 tablet 0   No current facility-administered medications for this visit.    Facility-Administered Medications Ordered in Other Visits  Medication Dose Route Frequency Provider Last Rate Last Dose  . technetium tetrofosmin (TC-MYOVIEW) injection 30 millicurie  30 millicurie Intravenous Once PRN [employer] MD         Past Medical History:   Diagnosis Date  . Anemia   . 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 Arthritis   . ASCVD (arteriosclerotic cardiovascular disease)    Neg. stress nuc. 3/99; cath in 4/04,-80%LAD; 70% RCA, -> DESx2; residual 60% distal RCA; nl EF  . Asthmatic bronchitis   . Carotid artery occlusion   . Cerebrovascular disease     mild; <50% ICA with moderate plaque bilaterally in 12/2009  . CHF (congestive heart failure) ([place])   . Chronic kidney disease (CKD), stage III (moderate)    Creat.= 1.78 in 11/2009  . Clostridium difficile colitis OCT 2014  . Coronary disease   . Diabetes mellitus    Treated with oral agents; HgbA1c of 7in 08/2008  . Diverticulosis   . GERD (gastroesophageal reflux disease)   . Gout   . Hyperkalemia    potassium of 5.1-5.3 in 2011  . Hyperlipidemia   . Hypertension   . Myocardial infarction ([place])   . Nervousness(799.21)   . PVD (peripheral vascular disease) ([place])     moderate to severely decreased ABI's in 8/08, sig. aortic inflow disease; repeat ABI's in 2011 improved- mild disease on the left and moderate to severe disease on the right  . Stroke [facility]) 2012   Mini strokes- Temporary  blindness  . Tobacco abuse    50 pack years continuing at 1/2 pack per day    ROS:   All systems reviewed and negative except as noted in the HPI.   Past Surgical History:  Procedure Laterality Date  . ABDOMINAL HYSTERECTOMY    . carpel tunnel release     bilateral  . CHOLECYSTECTOMY    . COLONOSCOPY  2009  . TOTAL ABDOMINAL HYSTERECTOMY W/ BILATERAL SALPINGOOPHORECTOMY       Family History  Problem Relation Age of Onset  . Liver disease Mother 62  . Hypertension Mother   . Cerebral aneurysm Father   . Aneurysm Father 58  . Cancer Sister   . Liver disease Brother   . Liver disease Brother   . Liver disease Brother   . Diabetes type II Brother   . Alcohol abuse Brother   . Cancer Sister      Social History   Social History  . Marital status: Widowed     Spouse name: N/A  . Number of children: N/A  . Years of education: N/A   Occupational History  . Not on file.   Social History Main Topics  . Smoking status: Current Every Day Smoker    Packs/day: 0.50    Years: 50.00    Types: Cigarettes    Start date: 02/02/1966  . Smokeless tobacco: Never Used     Comment: 1/2 pack daily  . Alcohol use No  . Drug use: No  . Sexual activity: Not Currently   Other Topics Concern  .  Not on file   Social History Narrative  . No narrative on file     BP 112/64   Pulse (!) 59   Ht '5\' 5"'$  (1.651 m)   Wt 149 lb (67.6 kg)   SpO2 96%   BMI 24.79 kg/m   Physical Exam:  Well appearing 75 yo woman, NAD HEENT: Unremarkable Neck:  No JVD, no thyromegally Lymphatics:  No adenopathy Back:  No CVA tenderness Lungs:  Clear HEART:  Regular brady rhythm, no murmurs, no rubs, no clicks Abd:  soft, positive bowel sounds, no organomegally, no rebound, no guarding Ext:  2 plus pulses, no edema, no cyanosis, no clubbing Skin:  No rashes no nodules Neuro:  CN II through XII intact, motor grossly intact  EKG - sinus bradycardia with 3:2 and 2:1 AV block  Assess/Plan: 1. High grade heart block - while the patient has not had frank syncope,she is symptomatic and has very little energy. She feels tired and fatigued. I have recommended she undergo PPM insertion. The risks/benefits/goals/expectations of the procedure were reviewed with the patient and her daughter and they wish to proceed. 2. CAD - she denies anginal symptoms. Her stress test had to be cancelled due to her slow heart rate. This will be rescheduled after PPM. 3. HTN - her blood pressure is well controlled on medical therapy. 4. COPD - she is encouraged to stop smoking. She will continue bronchodilator therapy.  5. Peripheral vascular disease - she has known disease including carotid disease as well. She has been encouraged to stop smoking.  Mikle Bosworth.D.

## 2016-02-28 ENCOUNTER — Other Ambulatory Visit: Payer: Self-pay | Admitting: Cardiovascular Disease

## 2016-03-09 ENCOUNTER — Telehealth: Payer: Self-pay | Admitting: *Deleted

## 2016-03-09 NOTE — Telephone Encounter (Signed)
Received VM from patient.   Requesting to schedule appointment for Flu Shot.   Please contact patient.

## 2016-03-16 ENCOUNTER — Encounter: Payer: Self-pay | Admitting: Family Medicine

## 2016-03-16 ENCOUNTER — Ambulatory Visit: Payer: Commercial Managed Care - HMO | Admitting: Gastroenterology

## 2016-03-20 DIAGNOSIS — Z01818 Encounter for other preprocedural examination: Secondary | ICD-10-CM | POA: Diagnosis not present

## 2016-03-20 DIAGNOSIS — I442 Atrioventricular block, complete: Secondary | ICD-10-CM | POA: Diagnosis not present

## 2016-03-20 LAB — CBC WITH DIFFERENTIAL/PLATELET
BASOS ABS: 65 {cells}/uL (ref 0–200)
BASOS PCT: 1 %
EOS ABS: 260 {cells}/uL (ref 15–500)
Eosinophils Relative: 4 %
HEMATOCRIT: 36.4 % (ref 35.0–45.0)
Hemoglobin: 11.8 g/dL (ref 11.7–15.5)
LYMPHS PCT: 43 %
Lymphs Abs: 2795 cells/uL (ref 850–3900)
MCH: 29.6 pg (ref 27.0–33.0)
MCHC: 32.4 g/dL (ref 32.0–36.0)
MCV: 91.2 fL (ref 80.0–100.0)
MONO ABS: 520 {cells}/uL (ref 200–950)
MONOS PCT: 8 %
MPV: 10 fL (ref 7.5–12.5)
NEUTROS PCT: 44 %
Neutro Abs: 2860 cells/uL (ref 1500–7800)
PLATELETS: 244 10*3/uL (ref 140–400)
RBC: 3.99 MIL/uL (ref 3.80–5.10)
RDW: 14.5 % (ref 11.0–15.0)
WBC: 6.5 10*3/uL (ref 3.8–10.8)

## 2016-03-20 LAB — PROTIME-INR
INR: 1
Prothrombin Time: 11 s (ref 9.0–11.5)

## 2016-03-21 LAB — BASIC METABOLIC PANEL
BUN: 20 mg/dL (ref 7–25)
CALCIUM: 9.8 mg/dL (ref 8.6–10.4)
CHLORIDE: 108 mmol/L (ref 98–110)
CO2: 25 mmol/L (ref 20–31)
CREATININE: 1.11 mg/dL — AB (ref 0.60–0.93)
Glucose, Bld: 95 mg/dL (ref 65–99)
Potassium: 4.5 mmol/L (ref 3.5–5.3)
Sodium: 140 mmol/L (ref 135–146)

## 2016-03-22 NOTE — Telephone Encounter (Signed)
Patient states she is going to the pharmacy to get flu shot.

## 2016-03-24 ENCOUNTER — Encounter (HOSPITAL_COMMUNITY)
Admission: RE | Disposition: A | Discharge status: Home / Self Care | Payer: Self-pay | Source: Ambulatory Visit | Attending: Internal Medicine

## 2016-03-24 ENCOUNTER — Encounter (HOSPITAL_COMMUNITY): Payer: Self-pay | Admitting: Internal Medicine

## 2016-03-24 ENCOUNTER — Ambulatory Visit (HOSPITAL_COMMUNITY)
Admission: RE | Admit: 2016-03-24 | Discharge: 2016-03-25 | Disposition: A | Discharge status: Home / Self Care | Payer: Commercial Managed Care - HMO | Source: Ambulatory Visit | Attending: Internal Medicine | Admitting: Internal Medicine

## 2016-03-24 DIAGNOSIS — M109 Gout, unspecified: Secondary | ICD-10-CM | POA: Diagnosis not present

## 2016-03-24 DIAGNOSIS — Z955 Presence of coronary angioplasty implant and graft: Secondary | ICD-10-CM | POA: Insufficient documentation

## 2016-03-24 DIAGNOSIS — I2510 Atherosclerotic heart disease of native coronary artery without angina pectoris: Secondary | ICD-10-CM | POA: Diagnosis present

## 2016-03-24 DIAGNOSIS — I509 Heart failure, unspecified: Secondary | ICD-10-CM | POA: Diagnosis not present

## 2016-03-24 DIAGNOSIS — I495 Sick sinus syndrome: Secondary | ICD-10-CM | POA: Diagnosis not present

## 2016-03-24 DIAGNOSIS — E1122 Type 2 diabetes mellitus with diabetic chronic kidney disease: Secondary | ICD-10-CM | POA: Insufficient documentation

## 2016-03-24 DIAGNOSIS — Z7982 Long term (current) use of aspirin: Secondary | ICD-10-CM | POA: Diagnosis not present

## 2016-03-24 DIAGNOSIS — I6529 Occlusion and stenosis of unspecified carotid artery: Secondary | ICD-10-CM | POA: Diagnosis not present

## 2016-03-24 DIAGNOSIS — J449 Chronic obstructive pulmonary disease, unspecified: Secondary | ICD-10-CM | POA: Diagnosis present

## 2016-03-24 DIAGNOSIS — E785 Hyperlipidemia, unspecified: Secondary | ICD-10-CM | POA: Diagnosis present

## 2016-03-24 DIAGNOSIS — I679 Cerebrovascular disease, unspecified: Secondary | ICD-10-CM | POA: Diagnosis present

## 2016-03-24 DIAGNOSIS — I252 Old myocardial infarction: Secondary | ICD-10-CM | POA: Diagnosis not present

## 2016-03-24 DIAGNOSIS — Z880 Allergy status to penicillin: Secondary | ICD-10-CM | POA: Diagnosis not present

## 2016-03-24 DIAGNOSIS — E1151 Type 2 diabetes mellitus with diabetic peripheral angiopathy without gangrene: Secondary | ICD-10-CM | POA: Diagnosis not present

## 2016-03-24 DIAGNOSIS — K219 Gastro-esophageal reflux disease without esophagitis: Secondary | ICD-10-CM | POA: Diagnosis present

## 2016-03-24 DIAGNOSIS — M1990 Unspecified osteoarthritis, unspecified site: Secondary | ICD-10-CM | POA: Diagnosis not present

## 2016-03-24 DIAGNOSIS — D631 Anemia in chronic kidney disease: Secondary | ICD-10-CM | POA: Insufficient documentation

## 2016-03-24 DIAGNOSIS — I739 Peripheral vascular disease, unspecified: Secondary | ICD-10-CM | POA: Diagnosis present

## 2016-03-24 DIAGNOSIS — I130 Hypertensive heart and chronic kidney disease with heart failure and stage 1 through stage 4 chronic kidney disease, or unspecified chronic kidney disease: Secondary | ICD-10-CM | POA: Diagnosis not present

## 2016-03-24 DIAGNOSIS — I442 Atrioventricular block, complete: Secondary | ICD-10-CM | POA: Diagnosis not present

## 2016-03-24 DIAGNOSIS — F17200 Nicotine dependence, unspecified, uncomplicated: Secondary | ICD-10-CM | POA: Diagnosis present

## 2016-03-24 DIAGNOSIS — F17210 Nicotine dependence, cigarettes, uncomplicated: Secondary | ICD-10-CM | POA: Insufficient documentation

## 2016-03-24 DIAGNOSIS — I10 Essential (primary) hypertension: Secondary | ICD-10-CM | POA: Diagnosis present

## 2016-03-24 DIAGNOSIS — Z1211 Encounter for screening for malignant neoplasm of colon: Secondary | ICD-10-CM

## 2016-03-24 DIAGNOSIS — E1140 Type 2 diabetes mellitus with diabetic neuropathy, unspecified: Secondary | ICD-10-CM

## 2016-03-24 DIAGNOSIS — E119 Type 2 diabetes mellitus without complications: Secondary | ICD-10-CM

## 2016-03-24 DIAGNOSIS — Z8673 Personal history of transient ischemic attack (TIA), and cerebral infarction without residual deficits: Secondary | ICD-10-CM | POA: Diagnosis not present

## 2016-03-24 DIAGNOSIS — N183 Chronic kidney disease, stage 3 (moderate): Secondary | ICD-10-CM | POA: Insufficient documentation

## 2016-03-24 DIAGNOSIS — Z823 Family history of stroke: Secondary | ICD-10-CM | POA: Insufficient documentation

## 2016-03-24 DIAGNOSIS — Z7951 Long term (current) use of inhaled steroids: Secondary | ICD-10-CM | POA: Diagnosis not present

## 2016-03-24 DIAGNOSIS — Z950 Presence of cardiac pacemaker: Secondary | ICD-10-CM

## 2016-03-24 DIAGNOSIS — Z833 Family history of diabetes mellitus: Secondary | ICD-10-CM | POA: Diagnosis not present

## 2016-03-24 DIAGNOSIS — N189 Chronic kidney disease, unspecified: Secondary | ICD-10-CM | POA: Diagnosis present

## 2016-03-24 HISTORY — DX: Transient cerebral ischemic attack, unspecified: G45.9

## 2016-03-24 HISTORY — DX: Atrioventricular block, complete: I44.2

## 2016-03-24 HISTORY — PX: EP IMPLANTABLE DEVICE: SHX172B

## 2016-03-24 HISTORY — DX: Presence of cardiac pacemaker: Z95.0

## 2016-03-24 LAB — GLUCOSE, CAPILLARY
GLUCOSE-CAPILLARY: 68 mg/dL (ref 65–99)
GLUCOSE-CAPILLARY: 109 mg/dL — AB (ref 65–99)
Glucose-Capillary: 132 mg/dL — ABNORMAL HIGH (ref 65–99)
Glucose-Capillary: 94 mg/dL (ref 65–99)
Glucose-Capillary: 111 mg/dL — ABNORMAL HIGH (ref 65–99)

## 2016-03-24 LAB — SURGICAL PCR SCREEN
MRSA, PCR: NEGATIVE
Staphylococcus aureus: NEGATIVE

## 2016-03-24 SURGERY — PACEMAKER IMPLANT
Anesthesia: LOCAL

## 2016-03-24 MED ORDER — ASPIRIN EC 81 MG PO TBEC
81.0000 mg | DELAYED_RELEASE_TABLET | Freq: Every day | ORAL | Status: DC
  Administered 2016-03-24: 81 mg via ORAL
  Filled 2016-03-24 (×2): qty 1

## 2016-03-24 MED ORDER — IRBESARTAN 75 MG PO TABS
37.5000 mg | ORAL_TABLET | Freq: Every day | ORAL | Status: DC
Start: 2016-03-24 — End: 2016-03-25
  Administered 2016-03-24 – 2016-03-25 (×2): 37.5 mg via ORAL
  Filled 2016-03-24 (×2): qty 0.5

## 2016-03-24 MED ORDER — LIDOCAINE HCL (PF) 1 % IJ SOLN
INTRAMUSCULAR | Status: DC | PRN
  Administered 2016-03-24: 30 mL via SUBCUTANEOUS

## 2016-03-24 MED ORDER — LUBIPROSTONE 8 MCG PO CAPS
8.0000 ug | ORAL_CAPSULE | Freq: Two times a day (BID) | ORAL | Status: DC
  Filled 2016-03-24 (×3): qty 1

## 2016-03-24 MED ORDER — INFLUENZA VAC SPLIT QUAD 0.5 ML IM SUSY
0.5000 mL | PREFILLED_SYRINGE | INTRAMUSCULAR | Status: AC
  Administered 2016-03-25: 0.5 mL via INTRAMUSCULAR
  Filled 2016-03-24: qty 0.5

## 2016-03-24 MED ORDER — CALCIUM CARBONATE-VITAMIN D 500-200 MG-UNIT PO TABS
1.0000 | ORAL_TABLET | Freq: Every day | ORAL | Status: DC
  Administered 2016-03-25: 1 via ORAL
  Filled 2016-03-24: qty 1

## 2016-03-24 MED ORDER — DEXTROSE 50 % IV SOLN
INTRAVENOUS | Status: AC
  Administered 2016-03-24: 25 mL
  Filled 2016-03-24: qty 50

## 2016-03-24 MED ORDER — MIDAZOLAM HCL 5 MG/5ML IJ SOLN
INTRAMUSCULAR | Status: AC
  Filled 2016-03-24: qty 5

## 2016-03-24 MED ORDER — MUPIROCIN 2 % EX OINT
TOPICAL_OINTMENT | CUTANEOUS | Status: AC
  Administered 2016-03-24: 07:00:00
  Filled 2016-03-24: qty 22

## 2016-03-24 MED ORDER — PRAVASTATIN SODIUM 20 MG PO TABS
20.0000 mg | ORAL_TABLET | Freq: Every day | ORAL | Status: DC
  Administered 2016-03-24: 20 mg via ORAL
  Filled 2016-03-24 (×2): qty 1

## 2016-03-24 MED ORDER — MIDAZOLAM HCL 5 MG/5ML IJ SOLN
INTRAMUSCULAR | Status: DC | PRN
Start: 2016-03-24 — End: 2016-03-24
  Administered 2016-03-24 (×2): 1 mg via INTRAVENOUS

## 2016-03-24 MED ORDER — EZETIMIBE 10 MG PO TABS
10.0000 mg | ORAL_TABLET | Freq: Every day | ORAL | Status: DC
  Administered 2016-03-24 – 2016-03-25 (×2): 10 mg via ORAL
  Filled 2016-03-24 (×2): qty 1

## 2016-03-24 MED ORDER — CRANBERRY 250 MG PO CAPS: 250.0000 mg | ORAL_CAPSULE | Freq: Every day | ORAL | Status: DC

## 2016-03-24 MED ORDER — ENSURE ENLIVE PO LIQD
237.0000 mL | Freq: Two times a day (BID) | ORAL | Status: DC
  Administered 2016-03-24: 237 mL via ORAL

## 2016-03-24 MED ORDER — SODIUM CHLORIDE 0.9 % IR SOLN
Status: AC
  Filled 2016-03-24: qty 2

## 2016-03-24 MED ORDER — GLUCERNA SHAKE PO LIQD
237.0000 mL | Freq: Three times a day (TID) | ORAL | Status: DC
  Administered 2016-03-24 – 2016-03-25 (×2): 237 mL via ORAL

## 2016-03-24 MED ORDER — CHLORHEXIDINE GLUCONATE 4 % EX LIQD
60.0000 mL | Freq: Once | CUTANEOUS | Status: DC
  Filled 2016-03-24: qty 60

## 2016-03-24 MED ORDER — LIDOCAINE HCL (PF) 1 % IJ SOLN
INTRAMUSCULAR | Status: AC
  Filled 2016-03-24: qty 60

## 2016-03-24 MED ORDER — CALCIUM CITRATE-VITAMIN D 315-200 MG-UNIT PO TABS: 1.0000 | ORAL_TABLET | Freq: Every day | ORAL | Status: DC

## 2016-03-24 MED ORDER — MIRABEGRON ER 25 MG PO TB24
25.0000 mg | ORAL_TABLET | Freq: Every day | ORAL | Status: DC
Start: 2016-03-24 — End: 2016-03-25
  Administered 2016-03-24: 25 mg via ORAL
  Filled 2016-03-24: qty 1

## 2016-03-24 MED ORDER — SODIUM CHLORIDE 0.9 % IV SOLN
INTRAVENOUS | Status: DC
  Administered 2016-03-24: 07:00:00 via INTRAVENOUS

## 2016-03-24 MED ORDER — ALBUTEROL SULFATE (2.5 MG/3ML) 0.083% IN NEBU: 2.5000 mg | INHALATION_SOLUTION | RESPIRATORY_TRACT | Status: DC | PRN

## 2016-03-24 MED ORDER — VANCOMYCIN HCL IN DEXTROSE 1-5 GM/200ML-% IV SOLN
INTRAVENOUS | Status: AC
  Filled 2016-03-24: qty 200

## 2016-03-24 MED ORDER — ONDANSETRON HCL 4 MG/2ML IJ SOLN
4.0000 mg | Freq: Four times a day (QID) | INTRAMUSCULAR | Status: DC | PRN
Start: 2016-03-24 — End: 2016-03-25

## 2016-03-24 MED ORDER — HEPARIN (PORCINE) IN NACL 2-0.9 UNIT/ML-% IJ SOLN
INTRAMUSCULAR | Status: DC | PRN
  Administered 2016-03-24: 500 mL

## 2016-03-24 MED ORDER — FENTANYL CITRATE (PF) 100 MCG/2ML IJ SOLN
INTRAMUSCULAR | Status: DC | PRN
Start: 2016-03-24 — End: 2016-03-24
  Administered 2016-03-24 (×2): 12.5 ug via INTRAVENOUS

## 2016-03-24 MED ORDER — VANCOMYCIN HCL IN DEXTROSE 1-5 GM/200ML-% IV SOLN
1000.0000 mg | Freq: Two times a day (BID) | INTRAVENOUS | Status: AC
  Administered 2016-03-24: 1000 mg via INTRAVENOUS
  Filled 2016-03-24: qty 200

## 2016-03-24 MED ORDER — ACETAMINOPHEN 500 MG PO TABS: 500.0000 mg | ORAL_TABLET | Freq: Four times a day (QID) | ORAL | Status: DC | PRN

## 2016-03-24 MED ORDER — FENTANYL CITRATE (PF) 100 MCG/2ML IJ SOLN
INTRAMUSCULAR | Status: AC
  Filled 2016-03-24: qty 2

## 2016-03-24 MED ORDER — VANCOMYCIN HCL IN DEXTROSE 1-5 GM/200ML-% IV SOLN
1000.0000 mg | INTRAVENOUS | Status: AC
Start: 2016-03-24 — End: 2016-03-24
  Administered 2016-03-24: 1000 mg via INTRAVENOUS
  Filled 2016-03-24: qty 200

## 2016-03-24 MED ORDER — EPINEPHRINE 0.3 MG/0.3ML IJ SOAJ: 0.3000 mg | Freq: Once | INTRAMUSCULAR | Status: DC

## 2016-03-24 MED ORDER — SODIUM CHLORIDE 0.9 % IR SOLN
80.0000 mg | Status: AC
  Administered 2016-03-24: 80 mg
  Filled 2016-03-24: qty 2

## 2016-03-24 MED ORDER — ACETAMINOPHEN 325 MG PO TABS: 325.0000 mg | ORAL_TABLET | ORAL | Status: DC | PRN

## 2016-03-24 MED ORDER — ALPRAZOLAM 0.25 MG PO TABS: 0.2500 mg | ORAL_TABLET | Freq: Every evening | ORAL | Status: DC | PRN

## 2016-03-24 MED ORDER — HEPARIN (PORCINE) IN NACL 2-0.9 UNIT/ML-% IJ SOLN
INTRAMUSCULAR | Status: AC
Start: 2016-03-24 — End: 2016-03-24
  Filled 2016-03-24: qty 500

## 2016-03-24 SURGICAL SUPPLY — 7 items
CABLE SURGICAL S-101-97-12 (CABLE) ×2 IMPLANT
LEAD TENDRIL MRI 46CM LPA1200M (Lead) ×2 IMPLANT
LEAD TENDRIL MRI 52CM LPA1200M (Lead) ×2 IMPLANT
PACEMAKER ASSURITY DR-RF (Pacemaker) ×2 IMPLANT
PAD DEFIB LIFELINK (PAD) ×2 IMPLANT
SHEATH CLASSIC 8F (SHEATH) ×4 IMPLANT
TRAY PACEMAKER INSERTION (PACKS) ×2 IMPLANT

## 2016-03-24 NOTE — Progress Notes (Signed)
Initial Nutrition Assessment  INTERVENTION:  Provide Glucerna Shake po TID, each supplement provides 220 kcal and 10 grams of protein   NUTRITION DIAGNOSIS:   Inadequate oral intake related to poor appetite as evidenced by per patient/family report, moderate depletions of muscle mass, mild depletion of body fat.   GOAL:   Patient will meet greater than or equal to 90% of their needs   MONITOR:   PO intake, Supplement acceptance, Weight trends, Labs, Skin, I & O's  REASON FOR ASSESSMENT:   Malnutrition Screening Tool    ASSESSMENT:   75 yo woman with CAD, tobacco abuse and HTN. She has felt fatigued for several months. She denies chest pain. She tried to undergo exercise testing but her resting HR was in the 30's and the procedure cancelled. . presented today for surgery, with the diagnosis of dysfunctional sinus node. S/P heart cath  Pt states that for the past year she has had no appetite. She sates that sometimes she doesn't eat anything at all, sometimes she eats one meal, and sometimes she eats a snack (peanut butter crackers) and one meal. She reports drinking caffeine free pepsi and caffeine free coffee throughout the day. She reports losing from 170 lbs to 149 lbs in the past year and that she used to weigh 216 lbs. Per weight history, pt weighed 155 lbs one year ago- 4% wt loss. Pt does have some moderate muscle wasting of clavicles and hand and mild fat wasting or arms, but otherwise appears well-nourished. RD discussed the importance of consistent PO intake and healthful PO intake. Encouraged increasing intake of protein-rich foods, fruits, and vegetables with intake of nutritional supplement on days when PO intake is poor. Discouraged intake of sugar-sweetened beverages.   Labs: reviewed.   Diet Order:  Diet heart healthy/carb modified Room service appropriate? Yes; Fluid consistency: Thin  Skin:  Reviewed, no issues  Last BM:  PTA  Height:   Ht Readings from Last  1 Encounters:  03/24/16 '5\' 7"'$  (1.702 m)    Weight:   Wt Readings from Last 1 Encounters:  03/24/16 149 lb 11.1 oz (67.9 kg)    Ideal Body Weight:  61.36 kg  BMI:  Body mass index is 23.45 kg/m.  Estimated Nutritional Needs:   Kcal:  1600-1800  Protein:  70-80 grams  Fluid:  1.6-1.8 L/day  EDUCATION NEEDS:   No education needs identified at this time  [place], CSP, LDN Inpatient Clinical Dietitian Pager: [phone] After Hours Pager: [phone]

## 2016-03-24 NOTE — Interval H&P Note (Signed)
History and Physical Interval Note:  03/24/2016 7:07 AM  Charlotte Henry  has presented today for surgery, with the diagnosis of dysfunctional sinus node  chb  The various methods of treatment have been discussed with the patient and family. After consideration of risks, benefits and other options for treatment, the patient has consented to  Procedure(s): Pacemaker Implant (N/A) as a surgical intervention .  The patient's history has been reviewed, patient examined, no change in status, stable for surgery.  I have reviewed the patient's chart and labs.  Questions were answered to the patient's satisfaction.     Charlotte Henry

## 2016-03-24 NOTE — H&P (View-Only) (Signed)
HPI Charlotte Henry is referred today Dr. Raliegh Ip for evaluation of high grade heart block. She is a pleasant 75 yo woman with CAD, tobacco abuse and HTN. She has felt fatigued for several months. She denies chest pain. She tried to undergo exercise testing but her resting HR was in the 30's and the procedure cancelled. The patient is on no AV nodal blocking drugs. She has never had frank syncope. She has high grade daytime heart block with ventricular rates in the 35-45 range. No edema. No thyroid dysfunction symptoms. Allergies  Allergen Reactions  . Ace Inhibitors Hives, Shortness Of Breath and Swelling  . Aspirin Other (See Comments)    Blood in stool and nose bleed  . Codeine Swelling  . Pineapple Shortness Of Breath and Swelling  . Plasticized Base [Plastibase] Hives and Swelling  . Ultram [Tramadol] Swelling  . Adhesive [Tape] Swelling and Rash  . Naprosyn [Naproxen] Swelling  . Clindamycin/Lincomycin Other (See Comments)    Burning sensation throat, abdomen  . Linzess [Linaclotide]     DIARRHEA, FECAL INCONTINENCE  . Nsaids   . Penicillins     Lip swelling, Hives Has patient had a PCN reaction causing immediate rash, facial/tongue/throat swelling, SOB or lightheadedness with hypotension:YES Has patient had a PCN reaction causing severe rash involving mucus membranes or skin necrosis: NO Has patient had a PCN reaction that required hospitalization NO Has patient had a PCN reaction occurring within the last 10 years: NO If all of the above answers are "NO", then may proceed with Cephalosporin use.  . Varenicline Tartrate Swelling     Current Outpatient Prescriptions  Medication Sig Dispense Refill  . albuterol (PROVENTIL HFA;VENTOLIN HFA) 108 (90 Base) MCG/ACT inhaler Inhale 2 puffs into the lungs every 4 (four) hours as needed for wheezing or shortness of breath. 1 Inhaler 2  . ALPRAZolam (XANAX) 0.25 MG tablet TAKE 1 TABLET BY MOUTH AT BEDTIME AS NEEDED. 30 tablet 1  . aspirin  81 MG tablet Take 81 mg by mouth daily.      . calcium citrate-vitamin D (CITRACAL+D) 315-200 MG-UNIT per tablet Take 1 tablet by mouth daily.      . Cranberry 250 MG CAPS Take 250 mg by mouth daily.    . Cyanocobalamin (VITAMIN B 12 PO) Take by mouth.    . EPINEPHrine 0.3 mg/0.3 mL IJ SOAJ injection Inject 0.3 mLs (0.3 mg total) into the muscle once. 1 Device 3  . esomeprazole (NEXIUM) 40 MG capsule TAKE 1 CAPSULE BY MOUTH ONCE DAILY FOR STOMACH. 30 capsule 0  . Fluticasone-Salmeterol (ADVAIR) 100-50 MCG/DOSE AEPB Inhale 1 puff into the lungs 2 (two) times daily. 1 each 3  . lidocaine-prilocaine (EMLA) cream     . lipase/protease/amylase (CREON) 12000 units CPEP capsule 1 with meals and snacks. 270 capsule 5  . lubiprostone (AMITIZA) 8 MCG capsule Take 1 capsule (8 mcg total) by mouth 2 (two) times daily with a meal. 60 capsule 1  . mirabegron ER (MYRBETRIQ) 25 MG TB24 tablet Take 1 tablet (25 mg total) by mouth at bedtime. For bladder 30 tablet 6  . mirtazapine (REMERON) 7.5 MG tablet 1 PO QHS 30 tablet 5  . Polyvinyl Alcohol-Povidone (REFRESH OP) Place 1 drop into the right eye daily as needed. For swelling and redness in right eye    . pravastatin (PRAVACHOL) 20 MG tablet Take 1 tablet (20 mg total) by mouth daily. 30 tablet 3  . Probiotic Product (PROBIOTIC DAILY PO) Take by  mouth daily.    . Tiotropium Bromide Monohydrate (SPIRIVA RESPIMAT) 1.25 MCG/ACT AERS Inhale 2 puffs into the lungs daily. 1 Inhaler 11  . valsartan (DIOVAN) 160 MG tablet TAKE 1 TABLET BY MOUTH DAILY. 90 tablet 0  . ZETIA 10 MG tablet TAKE 1 TABLET BY MOUTH ONCE A DAY. 30 tablet 0   No current facility-administered medications for this visit.    Facility-Administered Medications Ordered in Other Visits  Medication Dose Route Frequency Provider Last Rate Last Dose  . technetium tetrofosmin (TC-MYOVIEW) injection 30 millicurie  30 millicurie Intravenous Once PRN [employer] MD         Past Medical History:   Diagnosis Date  . Anemia   . 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 Arthritis   . ASCVD (arteriosclerotic cardiovascular disease)    Neg. stress nuc. 3/99; cath in 4/04,-80%LAD; 70% RCA, -> DESx2; residual 60% distal RCA; nl EF  . Asthmatic bronchitis   . Carotid artery occlusion   . Cerebrovascular disease     mild; <50% ICA with moderate plaque bilaterally in 12/2009  . CHF (congestive heart failure) ([place])   . Chronic kidney disease (CKD), stage III (moderate)    Creat.= 1.78 in 11/2009  . Clostridium difficile colitis OCT 2014  . Coronary disease   . Diabetes mellitus    Treated with oral agents; HgbA1c of 7in 08/2008  . Diverticulosis   . GERD (gastroesophageal reflux disease)   . Gout   . Hyperkalemia    potassium of 5.1-5.3 in 2011  . Hyperlipidemia   . Hypertension   . Myocardial infarction ([place])   . Nervousness(799.21)   . PVD (peripheral vascular disease) ([place])     moderate to severely decreased ABI's in 8/08, sig. aortic inflow disease; repeat ABI's in 2011 improved- mild disease on the left and moderate to severe disease on the right  . Stroke [facility]) 2012   Mini strokes- Temporary  blindness  . Tobacco abuse    50 pack years continuing at 1/2 pack per day    ROS:   All systems reviewed and negative except as noted in the HPI.   Past Surgical History:  Procedure Laterality Date  . ABDOMINAL HYSTERECTOMY    . carpel tunnel release     bilateral  . CHOLECYSTECTOMY    . COLONOSCOPY  2009  . TOTAL ABDOMINAL HYSTERECTOMY W/ BILATERAL SALPINGOOPHORECTOMY       Family History  Problem Relation Age of Onset  . Liver disease Mother 60  . Hypertension Mother   . Cerebral aneurysm Father   . Aneurysm Father 29  . Cancer Sister   . Liver disease Brother   . Liver disease Brother   . Liver disease Brother   . Diabetes type II Brother   . Alcohol abuse Brother   . Cancer Sister      Social History   Social History  . Marital status: Widowed     Spouse name: N/A  . Number of children: N/A  . Years of education: N/A   Occupational History  . Not on file.   Social History Main Topics  . Smoking status: Current Every Day Smoker    Packs/day: 0.50    Years: 50.00    Types: Cigarettes    Start date: 02/02/1966  . Smokeless tobacco: Never Used     Comment: 1/2 pack daily  . Alcohol use No  . Drug use: No  . Sexual activity: Not Currently   Other Topics Concern  .  Not on file   Social History Narrative  . No narrative on file     BP 112/64   Pulse (!) 59   Ht '5\' 5"'$  (1.651 m)   Wt 149 lb (67.6 kg)   SpO2 96%   BMI 24.79 kg/m   Physical Exam:  Well appearing 75 yo woman, NAD HEENT: Unremarkable Neck:  No JVD, no thyromegally Lymphatics:  No adenopathy Back:  No CVA tenderness Lungs:  Clear HEART:  Regular brady rhythm, no murmurs, no rubs, no clicks Abd:  soft, positive bowel sounds, no organomegally, no rebound, no guarding Ext:  2 plus pulses, no edema, no cyanosis, no clubbing Skin:  No rashes no nodules Neuro:  CN II through XII intact, motor grossly intact  EKG - sinus bradycardia with 3:2 and 2:1 AV block  Assess/Plan: 1. High grade heart block - while the patient has not had frank syncope,she is symptomatic and has very little energy. She feels tired and fatigued. I have recommended she undergo PPM insertion. The risks/benefits/goals/expectations of the procedure were reviewed with the patient and her daughter and they wish to proceed. 2. CAD - she denies anginal symptoms. Her stress test had to be cancelled due to her slow heart rate. This will be rescheduled after PPM. 3. HTN - her blood pressure is well controlled on medical therapy. 4. COPD - she is encouraged to stop smoking. She will continue bronchodilator therapy.  5. Peripheral vascular disease - she has known disease including carotid disease as well. She has been encouraged to stop smoking.  Mikle Bosworth.D.

## 2016-03-24 NOTE — Progress Notes (Signed)
Received patient from cath lab awake alert and oriented, left chest dressing, dry and intact, left arm on a sling, fingers warm and dry able to wiggle well.After several attempts of fixing the tele box lead wires, rhythm finally showed. Pacemaker pacing and capturing well, at rate of 60.

## 2016-03-25 ENCOUNTER — Other Ambulatory Visit: Payer: Self-pay

## 2016-03-25 ENCOUNTER — Encounter (HOSPITAL_COMMUNITY): Payer: Self-pay | Admitting: *Deleted

## 2016-03-25 ENCOUNTER — Ambulatory Visit (HOSPITAL_COMMUNITY): Payer: Commercial Managed Care - HMO

## 2016-03-25 DIAGNOSIS — E1122 Type 2 diabetes mellitus with diabetic chronic kidney disease: Secondary | ICD-10-CM | POA: Diagnosis not present

## 2016-03-25 DIAGNOSIS — I442 Atrioventricular block, complete: Secondary | ICD-10-CM | POA: Diagnosis not present

## 2016-03-25 DIAGNOSIS — I509 Heart failure, unspecified: Secondary | ICD-10-CM | POA: Diagnosis not present

## 2016-03-25 DIAGNOSIS — I130 Hypertensive heart and chronic kidney disease with heart failure and stage 1 through stage 4 chronic kidney disease, or unspecified chronic kidney disease: Secondary | ICD-10-CM | POA: Diagnosis not present

## 2016-03-25 DIAGNOSIS — E785 Hyperlipidemia, unspecified: Secondary | ICD-10-CM | POA: Diagnosis not present

## 2016-03-25 DIAGNOSIS — Z950 Presence of cardiac pacemaker: Secondary | ICD-10-CM | POA: Diagnosis not present

## 2016-03-25 DIAGNOSIS — N183 Chronic kidney disease, stage 3 (moderate): Secondary | ICD-10-CM | POA: Diagnosis not present

## 2016-03-25 DIAGNOSIS — D631 Anemia in chronic kidney disease: Secondary | ICD-10-CM | POA: Diagnosis not present

## 2016-03-25 DIAGNOSIS — I495 Sick sinus syndrome: Secondary | ICD-10-CM

## 2016-03-25 DIAGNOSIS — J449 Chronic obstructive pulmonary disease, unspecified: Secondary | ICD-10-CM | POA: Diagnosis present

## 2016-03-25 DIAGNOSIS — E1151 Type 2 diabetes mellitus with diabetic peripheral angiopathy without gangrene: Secondary | ICD-10-CM | POA: Diagnosis not present

## 2016-03-25 LAB — GLUCOSE, CAPILLARY: GLUCOSE-CAPILLARY: 98 mg/dL (ref 65–99)

## 2016-03-25 LAB — STREP PNEUMONIAE URINARY ANTIGEN: STREP PNEUMO URINARY ANTIGEN: NEGATIVE

## 2016-03-25 NOTE — Progress Notes (Signed)
Patient Name: Charlotte Henry Date of Encounter: 03/25/2016     Principal Problem:   Sinus node dysfunction [facility]) Active Problems:   Complete heart block ([place])    SUBJECTIVE  Doing well, s/p PPM insertion. No chest pain or sob.  CURRENT MEDS . aspirin EC  81 mg Oral Daily  . calcium-vitamin D  1 tablet Oral Q breakfast  . ezetimibe  10 mg Oral Daily  . feeding supplement (GLUCERNA SHAKE)  237 mL Oral TID BM  . Influenza vac split quadrivalent PF  0.5 mL Intramuscular Tomorrow-1000  . irbesartan  37.5 mg Oral Daily  . lubiprostone  8 mcg Oral BID WC  . mirabegron ER  25 mg Oral QHS  . pravastatin  20 mg Oral Daily    OBJECTIVE  Vitals:   03/24/16 2010 03/25/16 0100 03/25/16 0343 03/25/16 0511  BP: 138/68 140/68  121/74  Pulse: (!) 59 60  60  Resp: '17 18  18  '$ Temp:  98 F (36.7 C) 98.9 F (37.2 C) 98.9 F (37.2 C)  TempSrc:  Oral Oral Oral  SpO2: 96% 97%  95%  Weight:      Height:        Intake/Output Summary (Last 24 hours) at 03/25/16 0916 Last data filed at 03/25/16 0514  Gross per 24 hour  Intake             1287 ml  Output             1825 ml  Net             -538 ml   Filed Weights   03/24/16 0605 03/24/16 0929  Weight: 147 lb (66.7 kg) 149 lb 11.1 oz (67.9 kg)    PHYSICAL EXAM  General: Pleasant, NAD. Neuro: Alert and oriented X 3. Moves all extremities spontaneously. Psych: Normal affect. HEENT:  Normal  Neck: Supple without bruits or JVD. Lungs:  Resp regular and unlabored, CTA. No hematoma. Heart: RRR no s3, s4, or murmurs. Abdomen: Soft, non-tender, non-distended, BS + x 4.  Extremities: No clubbing, cyanosis or edema. DP/PT/Radials 2+ and equal bilaterally.  Accessory Clinical Findings  CBC No results for input(s): WBC, NEUTROABS, HGB, HCT, MCV, PLT in the last 72 hours. Basic Metabolic Panel No results for input(s): NA, K, CL, CO2, GLUCOSE, BUN, CREATININE, CALCIUM, MG, PHOS in the last 72 hours. Liver Function Tests No  results for input(s): AST, ALT, ALKPHOS, BILITOT, PROT, ALBUMIN in the last 72 hours. No results for input(s): LIPASE, AMYLASE in the last 72 hours. Cardiac Enzymes No results for input(s): CKTOTAL, CKMB, CKMBINDEX, TROPONINI in the last 72 hours. BNP Invalid input(s): POCBNP D-Dimer No results for input(s): DDIMER in the last 72 hours. Hemoglobin A1C No results for input(s): HGBA1C in the last 72 hours. Fasting Lipid Panel No results for input(s): CHOL, HDL, LDLCALC, TRIG, CHOLHDL, LDLDIRECT in the last 72 hours. Thyroid Function Tests No results for input(s): TSH, T4TOTAL, T3FREE, THYROIDAB in the last 72 hours.  Invalid input(s): FREET3  TELE  nsr with AV pacing  Radiology/Studies  Dg Chest 2 View  Result Date: 03/25/2016 CLINICAL DATA:  Status post pacemaker placement EXAM: CHEST  2 VIEW COMPARISON:  11/30/2015 FINDINGS: Pacing device is now seen in satisfactory position. Cardiac shadow is within normal limits. The lungs are well aerated bilaterally. No pneumothorax is seen. IMPRESSION: No pneumothorax following pacemaker placement Electronically Signed   By: Inez Catalina M.D.   On: 03/25/2016 08:17  ASSESSMENT AND PLAN  1. Sinus node dysfunction and CHB, doing well after pPM 2. PPM - her device has been interogated today and is working normally. CXR looks good.  3. HTN - she is stable.  4. Disp. - ok for DC home with usual followup.  Carleene Overlie Taylor,M.D.  03/25/2016 9:16 AMPatient ID: [name], female   DOB: 1940-12-25, 75 y.o.   MRN: [id number]

## 2016-03-25 NOTE — Discharge Summary (Signed)
Discharge Summary    Patient ID: Charlotte Henry,  MRN: [id number], DOB/AGE: [birth year] 75 y.o.  Admit date: 03/24/2016 Discharge date: 03/25/2016  Primary Care Provider: [name] Primary Cardiologist: Dr. Bronson Ing EP: Dr. Lovena Le   Discharge Diagnoses    Principal Problem:   CHB (complete heart block) [facility]) Active Problems:   Diabetes mellitus ([place])   Hyperlipidemia   Essential hypertension   Cardiovascular disease   Cerebrovascular disease   Peripheral vascular disease ([place])   Gastroesophageal reflux disease   Chronic kidney disease (CKD), stage III (moderate)   Tobacco use disorder   COPD (chronic obstructive pulmonary disease) ([facility])   Allergies Allergies  Allergen Reactions  . Ace Inhibitors Hives, Shortness Of Breath and Swelling  . Aspirin Other (See Comments)    Blood in stool and nose bleed  . Codeine Swelling  . Pineapple Shortness Of Breath and Swelling  . Plasticized Base [Plastibase] Hives and Swelling    No plastics  . Ultram [Tramadol] Swelling  . Adhesive [Tape] Swelling and Rash  . Naprosyn [Naproxen] Swelling  . Clindamycin/Lincomycin Other (See Comments)    Burning sensation throat, abdomen  . Linzess [Linaclotide]     DIARRHEA, FECAL INCONTINENCE  . Nsaids   . Penicillins     Lip swelling, Hives Has patient had a PCN reaction causing immediate rash, facial/tongue/throat swelling, SOB or lightheadedness with hypotension:YES Has patient had a PCN reaction causing severe rash involving mucus membranes or skin necrosis: NO Has patient had a PCN reaction that required hospitalization NO Has patient had a PCN reaction occurring within the last 10 years: NO If all of the above answers are "NO", then may proceed with Cephalosporin use.  . Varenicline Tartrate Swelling     History of Present Illness     Charlotte Henry is a 75 y.o. female with a history of CAD s/p DES to LAD and RCA (2004), COPD, tobacco abuse, DMT2, HLD, HTN and  recently diagnosed CHB by outpatient monitor who presented to [facility] on 03/24/16 for planned PPM placement.   She was seen by Dr. Bronson Ing in the office on 02/10/16 for fatigue x several months. She was noted to have bradycardia with Mobitz I AV block and a 1 week heart monitor was ordered with a stress test.   She tried to undergo exercise testing 02/24/16 but her resting HR was in the 30's and the procedure cancelled.   On call provider was called about CHB found on monitor and she was set up for office evaluation with Dr. Lovena Le. She was found to have high grade daytime heart block with ventricular rates in the 35-45 range. The patient is on no AV nodal blocking drugs. She has never had frank syncope.  Dr. Lovena Le recommended PPM placement which was scheduled for 03/24/16.    Hospital Course     Consultants: none  1. High grade heart block: s/p successful PPM this admission with a St Jude dual-chamber PPM with a St. Jude (serial number [id number]) pacemaker. CXR clear. Will set up 10 day wound check.   2. CAD: she denies anginal symptoms. Her stress test had to be cancelled due to her slow heart rate. This will be rescheduled after PPM.  3. HTN: her blood pressure is well controlled on medical therapy.  4. COPD: she is encouraged to stop smoking. She will continue bronchodilator therapy.   5. Peripheral vascular disease: she has known disease including carotid disease as well. She has been encouraged to stop  smoking.  6. HLD: continue statin and zetia  The patient has had an uncomplicated hospital course and is recovering well. She has been seen by Dr. Lovena Le today and deemed ready for discharge home. A staff message for all follow-up appointments has been send. Smoking cessation was disscussed in length. Discharge medications are listed below.  _____________  Discharge Vitals Blood pressure 121/74, pulse 60, temperature 98.9 F (37.2 C), temperature source Oral, resp. rate 18, height 5'  7" (1.702 m), weight 149 lb 11.1 oz (67.9 kg), SpO2 95 %.  Filed Weights   04-04-2016 0605 04-04-16 0929  Weight: 147 lb (66.7 kg) 149 lb 11.1 oz (67.9 kg)    Labs & Radiologic Studies       Dg Chest 2 View  Result Date: 03/25/2016 CLINICAL DATA:  Status post pacemaker placement EXAM: CHEST  2 VIEW COMPARISON:  11/30/2015 FINDINGS: Pacing device is now seen in satisfactory position. Cardiac shadow is within normal limits. The lungs are well aerated bilaterally. No pneumothorax is seen. IMPRESSION: No pneumothorax following pacemaker placement Electronically Signed   By: Inez Catalina M.D.   On: 03/25/2016 08:17     Diagnostic Studies/Procedures    Procedures 04-Apr-2016 Pacemaker Implant  Conclusion   CONCLUSIONS:   1. Successful implantation of a St. Jude dual-chamber pacemaker for symptomatic bradycardia due to sinus node dysfunction and complete heart block  2. No early apparent complications.        _____________    Disposition   Pt is being discharged home today in good condition.  Follow-up Plans & Appointments    Follow-up Information    Cristopher Peru, MD .   Specialty:  Cardiology Why:  The office will call you to schedule a 10 day wound check and follow up with Dr. Lovena Le in a few months  Contact information: [address] [address] [phone]        Kate Sable, MD .   Specialty:  Cardiology Why:  The office will call you to make an appointment in 1-2 weeks Contact information: [place] [address] [phone]            Discharge Medications     Medication List    TAKE these medications   acetaminophen 500 MG tablet Commonly known as:  TYLENOL Take 500 mg by mouth every 6 (six) hours as needed for mild pain or moderate pain.   albuterol 108 (90 Base) MCG/ACT inhaler Commonly known as:  PROVENTIL HFA;VENTOLIN HFA Inhale 2 puffs into the lungs every 4 (four) hours as needed for  wheezing or shortness of breath.   ALPRAZolam 0.25 MG tablet Commonly known as:  XANAX TAKE 1 TABLET BY MOUTH AT BEDTIME AS NEEDED.   aspirin 81 MG tablet Take 81 mg by mouth daily.   calcium citrate-vitamin D 315-200 MG-UNIT tablet Commonly known as:  CITRACAL+D Take 1 tablet by mouth daily.   Cranberry 250 MG Caps Take 250 mg by mouth daily.   EPINEPHrine 0.3 mg/0.3 mL Soaj injection Commonly known as:  EPI-PEN Inject 0.3 mLs (0.3 mg total) into the muscle once.   esomeprazole 40 MG capsule Commonly known as:  NEXIUM TAKE 1 CAPSULE BY MOUTH ONCE DAILY FOR STOMACH.   Fluticasone-Salmeterol 100-50 MCG/DOSE Aepb Commonly known as:  ADVAIR Inhale 1 puff into the lungs 2 (two) times daily. What changed:  when to take this  reasons to take this   lidocaine-prilocaine cream Commonly known as:  EMLA Apply  1 application topically 3 times/day as needed-between meals & bedtime.   lubiprostone 8 MCG capsule Commonly known as:  AMITIZA Take 1 capsule (8 mcg total) by mouth 2 (two) times daily with a meal.   mirabegron ER 25 MG Tb24 tablet Commonly known as:  MYRBETRIQ Take 1 tablet (25 mg total) by mouth at bedtime. For bladder   mirtazapine 7.5 MG tablet Commonly known as:  REMERON 1 PO QHS What changed:  how much to take  how to take this  when to take this  reasons to take this  additional instructions   pravastatin 20 MG tablet Commonly known as:  PRAVACHOL Take 1 tablet (20 mg total) by mouth daily.   PROBIOTIC DAILY PO Take 1 capsule by mouth daily.   Tiotropium Bromide Monohydrate 1.25 MCG/ACT Aers Commonly known as:  SPIRIVA RESPIMAT Inhale 2 puffs into the lungs daily. What changed:  when to take this  reasons to take this   valsartan 160 MG tablet Commonly known as:  DIOVAN TAKE 1 TABLET BY MOUTH DAILY.   VITAMIN B 12 PO Take 1 tablet by mouth daily.   ZETIA 10 MG tablet Generic drug:  ezetimibe TAKE 1 TABLET BY MOUTH ONCE A DAY.          Outstanding Labs/Studies   Stress test   Duration of Discharge Encounter   Greater than 30 minutes including physician time.  Signed, Angelena Form PA-C 03/25/2016, 10:43 AM  EP Attending  Patient seen and examined. Agree with above. She is stable for DC home. Usual followup.  Mikle Bosworth.D.

## 2016-03-27 ENCOUNTER — Telehealth: Payer: Self-pay | Admitting: Internal Medicine

## 2016-03-27 ENCOUNTER — Telehealth: Payer: Self-pay | Admitting: Cardiovascular Disease

## 2016-03-27 ENCOUNTER — Telehealth: Payer: Self-pay

## 2016-03-27 NOTE — Telephone Encounter (Signed)
After tape pulled off bandage from Friday's pacemaker insertion,she had blisters from adhesive.Used benadryl.I will add adhesive tape as alergy

## 2016-03-27 NOTE — Telephone Encounter (Signed)
Follow up   Pt verbalized that she is calling for rn about previous messages about the tape around her device and wants rn to call back

## 2016-03-27 NOTE — Telephone Encounter (Signed)
error 

## 2016-03-27 NOTE — Telephone Encounter (Signed)
Spoke with patient who called with concerns at pacemaker site. She states she has a tape allergy and is having a reaction from the tape used at device insertion. She states she was previously (this morning) instructed to use cortisone cream at the site of the reaction but has seen no change. I encouraged her to continue to use the cream following the labeled instructions. I instructed her to take care not to saturate the steri-strips/incision site. She verbalized understanding. I told her that she may not see a relief for a day or two and that this was to be expected. I scheduled her wound check appointment. She will call the device clinic with any other concerns. She verbalized appreciation.

## 2016-03-27 NOTE — Telephone Encounter (Signed)
Pt had pacemaker placed on Friday 10/20, Karl Luke called stating the pt is having some problems please give her a call @ [phone]

## 2016-03-28 ENCOUNTER — Emergency Department (HOSPITAL_COMMUNITY)
Admission: EM | Admit: 2016-03-28 | Discharge: 2016-03-28 | Disposition: A | Discharge status: Home / Self Care | Payer: Commercial Managed Care - HMO | Attending: Emergency Medicine | Admitting: Emergency Medicine

## 2016-03-28 ENCOUNTER — Telehealth: Payer: Self-pay | Admitting: Family Medicine

## 2016-03-28 ENCOUNTER — Encounter (HOSPITAL_COMMUNITY): Payer: Self-pay | Admitting: Emergency Medicine

## 2016-03-28 DIAGNOSIS — Z79899 Other long term (current) drug therapy: Secondary | ICD-10-CM | POA: Diagnosis not present

## 2016-03-28 DIAGNOSIS — N183 Chronic kidney disease, stage 3 (moderate): Secondary | ICD-10-CM | POA: Insufficient documentation

## 2016-03-28 DIAGNOSIS — L231 Allergic contact dermatitis due to adhesives: Secondary | ICD-10-CM | POA: Diagnosis not present

## 2016-03-28 DIAGNOSIS — F17210 Nicotine dependence, cigarettes, uncomplicated: Secondary | ICD-10-CM | POA: Insufficient documentation

## 2016-03-28 DIAGNOSIS — I129 Hypertensive chronic kidney disease with stage 1 through stage 4 chronic kidney disease, or unspecified chronic kidney disease: Secondary | ICD-10-CM | POA: Diagnosis not present

## 2016-03-28 DIAGNOSIS — E1122 Type 2 diabetes mellitus with diabetic chronic kidney disease: Secondary | ICD-10-CM | POA: Diagnosis not present

## 2016-03-28 DIAGNOSIS — Z8673 Personal history of transient ischemic attack (TIA), and cerebral infarction without residual deficits: Secondary | ICD-10-CM | POA: Insufficient documentation

## 2016-03-28 DIAGNOSIS — Z7982 Long term (current) use of aspirin: Secondary | ICD-10-CM | POA: Insufficient documentation

## 2016-03-28 DIAGNOSIS — R21 Rash and other nonspecific skin eruption: Secondary | ICD-10-CM | POA: Diagnosis present

## 2016-03-28 DIAGNOSIS — J449 Chronic obstructive pulmonary disease, unspecified: Secondary | ICD-10-CM | POA: Diagnosis not present

## 2016-03-28 NOTE — ED Notes (Signed)
Delay explained to pt. NAD noted.

## 2016-03-28 NOTE — Discharge Instructions (Signed)
Continue to use the cortisone cream, as directed on packaging, as previously instructed by your Cardiologist.  Take over the counter benadryl, as directed on packaging, as needed for itching.  If the benadryl is too sedating, take an over the counter non-sedating antihistamine such as claritin, allegra or zyrtec, as directed on packaging.  Call your regular medical doctor and your Cardiologist today to schedule a follow up appointment this week.  Return to the Emergency Department immediately sooner if worsening.

## 2016-03-28 NOTE — ED Triage Notes (Signed)
Pt reports rash, blistering and itching from tape applied to her chest after a pacemaker implant on Friday. Pt states she is allergic to tape.

## 2016-03-28 NOTE — Telephone Encounter (Signed)
Pt needs Craig Staggers for Dr Lovena Le  At [facility] for Dx I49.5 and I44.2  auth # [id number] 6 visits from 04/06/16 - 10/03/16

## 2016-03-28 NOTE — ED Provider Notes (Signed)
[facility] DEPT Provider Note   CSN: [id number] Arrival date & time: 03/28/16  1243     History   Chief Complaint Chief Complaint  Patient presents with  . Blistering    HPI Charlotte Henry is a 75 y.o. female.  HPI  Pt was seen at 1520. Per pt, c/o gradual onset and persistence of constant "itching rash" that began 4 days ago. Pt states the rash is located "anywhere the tape was" when she had a pacemaker placed (left upper chest wall, left arm IV site). Pt states she "feels itchy." Pt called her Cards MD and was told to "put cortisone cream on it" and take OTC benadryl. Pt did this once and "it's not better." Denies SOB, no wheezing/stridor, no intra-oral edema.    Past Medical History:  Diagnosis Date  . 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 ASCVD (arteriosclerotic cardiovascular disease)    a. cath in 4/04,-80%LAD; 70% RCA, -> DESx2; residual 60% distal RCA; nl EF  b. Nuc 02/2016 aborted due to bradycardia. Will need to be rescheduled   . Carotid artery occlusion   . Cerebrovascular disease   . CHB (complete heart block) ([place])    a. s/p PPM on 03/24/16 with a [employer] Jude (serial number [id number]) pacemaker  . Chronic kidney disease (CKD), stage III (moderate)   . Clostridium difficile colitis 03/2013   a. 03/2013  . COPD (chronic obstructive pulmonary disease) ([place])   . Coronary disease   . Diabetes mellitus   . Diverticulosis   . GERD (gastroesophageal reflux disease)   . Gout   . Hyperlipidemia   . Hypertension   . PVD (peripheral vascular disease) ([place])    a. moderate to severely decreased ABI's in 8/08, sig. aortic inflow disease b. repeat ABI's in 2011 improved- mild disease on the left and moderate to severe disease on the right  . S/P placement of cardiac pacemaker    a. 03/24/16: St[employer] Jude (serial number [id number]) pacemaker  . TIA (transient ischemic attack) 2012  . Tobacco abuse    a. 50 pack years continuing at 1/2 pack per day    Patient Active  Problem List   Diagnosis Date Noted  . CHB (complete heart block) ([place])   . COPD (chronic obstructive pulmonary disease) ([place])   . Weight loss, unintentional 12/23/2015  . Anorexia 12/23/2015  . Tobacco use disorder 10/15/2015  . Back pain 12/30/2014  . OA (osteoarthritis) 12/30/2014  . Carotid stenosis 03/11/2014  . PVD (peripheral vascular disease) ([place]) 03/11/2014  . Overactive bladder 12/31/2013  . Rash and nonspecific skin eruption 05/19/2013  . Numbness and tingling-bilat foot 03/05/2013  . Weakness of hand- bilat 03/05/2013  . IBS (irritable bowel syndrome) 12/30/2012  . Peripheral neuropathy ([place]) 07/01/2012  . Insomnia 07/01/2012  . Chronic kidney disease (CKD), stage III (moderate) 08/25/2010  . Cardiovascular disease 08/16/2009  . Cerebrovascular disease 08/16/2009  . Peripheral vascular disease ([place]) 08/16/2009  . Diabetes mellitus ([place]) 10/26/2008  . Hyperlipidemia 10/26/2008  . Gout, unspecified 10/26/2008  . Essential hypertension 10/26/2008  . Asthmatic bronchitis 10/26/2008  . Gastroesophageal reflux disease 10/26/2008    Past Surgical History:  Procedure Laterality Date  . ABDOMINAL HYSTERECTOMY    . carpel tunnel release     bilateral  . CHOLECYSTECTOMY    . COLONOSCOPY  2009  . EP IMPLANTABLE DEVICE N/A 03/24/2016   Procedure: Pacemaker Implant;  Surgeon: [name], MD;  Location: [facility];  Service: Cardiovascular;  Laterality: N/A;  .  TOTAL ABDOMINAL HYSTERECTOMY W/ BILATERAL SALPINGOOPHORECTOMY      OB History    Gravida Para Term Preterm AB Living   '2 2 2         '$ SAB TAB Ectopic Multiple Live Births                   Home Medications    Prior to Admission medications   Medication Sig Start Date End Date Taking? Authorizing Provider  acetaminophen (TYLENOL) 500 MG tablet Take 500 mg by mouth every 6 (six) hours as needed for mild pain or moderate pain.    Historical Provider, MD  albuterol (PROVENTIL HFA;VENTOLIN HFA) 108  (90 Base) MCG/ACT inhaler Inhale 2 puffs into the lungs every 4 (four) hours as needed for wheezing or shortness of breath. 07/23/15   Alycia Rossetti, MD  ALPRAZolam Duanne Moron) 0.25 MG tablet TAKE 1 TABLET BY MOUTH AT BEDTIME AS NEEDED. 04/20/15   Alycia Rossetti, MD  aspirin 81 MG tablet Take 81 mg by mouth daily.      Historical Provider, MD  calcium citrate-vitamin D (CITRACAL+D) 315-200 MG-UNIT per tablet Take 1 tablet by mouth daily.      Historical Provider, MD  Cranberry 250 MG CAPS Take 250 mg by mouth daily.    Historical Provider, MD  Cyanocobalamin (VITAMIN B 12 PO) Take 1 tablet by mouth daily.     Historical Provider, MD  EPINEPHrine 0.3 mg/0.3 mL IJ SOAJ injection Inject 0.3 mLs (0.3 mg total) into the muscle once. 10/06/13   Alycia Rossetti, MD  esomeprazole (NEXIUM) 40 MG capsule TAKE 1 CAPSULE BY MOUTH ONCE DAILY FOR STOMACH. 02/14/16   Alycia Rossetti, MD  Fluticasone-Salmeterol (ADVAIR) 100-50 MCG/DOSE AEPB Inhale 1 puff into the lungs 2 (two) times daily. Patient taking differently: Inhale 1 puff into the lungs 2 (two) times daily as needed.  12/15/15   Alycia Rossetti, MD  lidocaine-prilocaine (EMLA) cream Apply 1 application topically 3 times/day as needed-between meals & bedtime.  05/20/14   Historical Provider, MD  lubiprostone (AMITIZA) 8 MCG capsule Take 1 capsule (8 mcg total) by mouth 2 (two) times daily with a meal. 01/10/16   Alycia Rossetti, MD  mirabegron ER (MYRBETRIQ) 25 MG TB24 tablet Take 1 tablet (25 mg total) by mouth at bedtime. For bladder 07/23/15   Alycia Rossetti, MD  mirtazapine (REMERON) 7.5 MG tablet 1 PO QHS Patient taking differently: Take 7.5 mg by mouth at bedtime as needed.  12/23/15   Danie Binder, MD  pravastatin (PRAVACHOL) 20 MG tablet Take 1 tablet (20 mg total) by mouth daily. 02/17/16   Alycia Rossetti, MD  Probiotic Product (PROBIOTIC DAILY PO) Take 1 capsule by mouth daily.     Historical Provider, MD  Tiotropium Bromide Monohydrate (SPIRIVA  RESPIMAT) 1.25 MCG/ACT AERS Inhale 2 puffs into the lungs daily. Patient taking differently: Inhale 2 puffs into the lungs daily as needed.  11/30/15   Alycia Rossetti, MD  valsartan (DIOVAN) 160 MG tablet TAKE 1 TABLET BY MOUTH DAILY. 01/14/16   Susy Frizzle, MD  ZETIA 10 MG tablet TAKE 1 TABLET BY MOUTH ONCE A DAY. 02/29/16   Herminio Commons, MD    Family History Family History  Problem Relation Age of Onset  . Liver disease Mother 60  . Hypertension Mother   . Cerebral aneurysm Father   . Aneurysm Father 8  . Cancer Sister   . Liver disease Brother   .  Liver disease Brother   . Liver disease Brother   . Diabetes type II Brother   . Alcohol abuse Brother   . Cancer Sister     Social History Social History  Substance Use Topics  . Smoking status: Current Every Day Smoker    Packs/day: 0.50    Years: 50.00    Types: Cigarettes    Start date: 02/02/1966  . Smokeless tobacco: Never Used     Comment: 1/2 pack daily  . Alcohol use No     Allergies   Ace inhibitors; Aspirin; Codeine; Pineapple; Plasticized base [plastibase]; Ultram [tramadol]; Adhesive [tape]; Naprosyn [naproxen]; Clindamycin/lincomycin; Linzess [linaclotide]; Nsaids; Penicillins; and Varenicline tartrate   Review of Systems Review of Systems ROS: Statement: All systems negative except as marked or noted in the HPI; Constitutional: Negative for fever and chills. ; ; Eyes: Negative for eye pain, redness and discharge. ; ; ENMT: Negative for ear pain, hoarseness, nasal congestion, sinus pressure and sore throat. ; ; Cardiovascular: Negative for chest pain, palpitations, diaphoresis, dyspnea and peripheral edema. ; ; Respiratory: Negative for cough, wheezing and stridor. ; ; Gastrointestinal: Negative for nausea, vomiting, diarrhea, abdominal pain, blood in stool, hematemesis, jaundice and rectal bleeding. . ; ; Genitourinary: Negative for dysuria, flank pain and hematuria. ; ; Musculoskeletal: Negative for  back pain and neck pain. Negative for swelling and trauma.; ; Skin: +itching rash. Negative for abrasions, blisters, bruising and skin lesion.; ; Neuro: Negative for headache, lightheadedness and neck stiffness. Negative for weakness, altered level of consciousness, altered mental status, extremity weakness, paresthesias, involuntary movement, seizure and syncope.       Physical Exam Updated Vital Signs BP 126/67   Pulse 70   Temp 98.2 F (36.8 C) (Oral)   Resp 15   Ht '5\' 7"'$  (1.702 m)   Wt 148 lb (67.1 kg)   SpO2 99%   BMI 23.18 kg/m   Physical Exam 1525: Physical examination:  Nursing notes reviewed; Vital signs and O2 SAT reviewed;  Constitutional: Well developed, Well nourished, Well hydrated, In no acute distress; Head:  Normocephalic, atraumatic; Eyes: EOMI, PERRL, No scleral icterus; ENMT: Mouth and pharynx normal, Mucous membranes moist. Mouth and pharynx without lesions. No tonsillar exudates. No intra-oral edema. No submandibular or sublingual edema. No hoarse voice, no drooling, no stridor..;; Neck: Supple, Full range of motion, No lymphadenopathy; Cardiovascular: Regular rate and rhythm, No gallop; Respiratory: Breath sounds clear & equal bilaterally, No wheezes.  Speaking full sentences with ease, Normal respiratory effort/excursion; Chest: +pt scratching at mild erythema to left and mid-chest areas, few very small blisters at tape site. DSD C/D/I. Nontender, Movement normal; Abdomen: Soft, Nontender, Nondistended, Normal bowel sounds; Genitourinary: No CVA tenderness; Extremities: +pt scratching at left forearm area, no rash. Pulses normal, No tenderness, No edema, No calf edema or asymmetry.; Neuro: AA&Ox3, Major CN grossly intact.  Speech clear. No gross focal motor or sensory deficits in extremities. Climbs on and off stretcher easily by herself. Gait steady.; Skin: Color normal, Warm, Dry.   ED Treatments / Results  Labs (all labs ordered are listed, but only abnormal  results are displayed)   EKG  EKG Interpretation None       Radiology   Procedures Procedures (including critical care time)  Medications Ordered in ED Medications - No data to display   Initial Impression / Assessment and Plan / ED Course  I have reviewed the triage vital signs and the nursing notes.  Pertinent labs & imaging results that were  available during my care of the patient were reviewed by me and considered in my medical decision making (see chart for details).  MDM Reviewed: previous chart, nursing note and vitals    1535:  Concern regarding starting systemic steroids with new surgical wound.  T/C to Cards Dr. Domenic Polite, case discussed, including:  HPI, pertinent PM/SHx, VS/PE, dx testing, ED course and treatment:  Agrees with holding systemic steroid at this time and to continue consistent treatment as previously instructed, f/u office. Dx, as well as d/w Cards MD, d/w pt and family.  Questions answered.  Verb understanding, agreeable to d/c home with outpt f/u.     Final Clinical Impressions(s) / ED Diagnoses   Final diagnoses:  None    New Prescriptions New Prescriptions   No medications on file     Francine Graven, DO 04/01/16 9012

## 2016-03-29 ENCOUNTER — Ambulatory Visit: Payer: Commercial Managed Care - HMO | Admitting: Physician Assistant

## 2016-04-03 ENCOUNTER — Ambulatory Visit (INDEPENDENT_AMBULATORY_CARE_PROVIDER_SITE_OTHER): Payer: Commercial Managed Care - HMO | Admitting: *Deleted

## 2016-04-03 ENCOUNTER — Telehealth: Payer: Self-pay | Admitting: Internal Medicine

## 2016-04-03 ENCOUNTER — Other Ambulatory Visit: Payer: Self-pay | Admitting: Physician Assistant

## 2016-04-03 DIAGNOSIS — I441 Atrioventricular block, second degree: Secondary | ICD-10-CM | POA: Diagnosis not present

## 2016-04-03 LAB — CUP PACEART INCLINIC DEVICE CHECK
Battery Remaining Longevity: 80 mo
Brady Statistic RA Percent Paced: 37 %
Brady Statistic RV Percent Paced: 99.87 %
Implantable Lead Implant Date: 20171020
Implantable Lead Location: 753860
Lead Channel Pacing Threshold Amplitude: 0.75 V
Lead Channel Pacing Threshold Pulse Width: 0.4 ms
Lead Channel Sensing Intrinsic Amplitude: 1.5 mV
Lead Channel Sensing Intrinsic Amplitude: 6.9 mV
Lead Channel Setting Pacing Amplitude: 3.5 V
Lead Channel Setting Pacing Pulse Width: 0.4 ms
MDC IDC LEAD IMPLANT DT: 20171020
MDC IDC LEAD LOCATION: 753859
MDC IDC MSMT BATTERY VOLTAGE: 3.14 V
MDC IDC MSMT LEADCHNL RA IMPEDANCE VALUE: 487.5 Ohm
MDC IDC MSMT LEADCHNL RV IMPEDANCE VALUE: 587.5 Ohm
MDC IDC MSMT LEADCHNL RV PACING THRESHOLD AMPLITUDE: 0.5 V
MDC IDC MSMT LEADCHNL RV PACING THRESHOLD PULSEWIDTH: 0.4 ms
MDC IDC PG IMPLANT DT: 20171020
MDC IDC PG SERIAL: 3180497
MDC IDC SESS DTM: 20171030173643
MDC IDC SET LEADCHNL RA PACING AMPLITUDE: 3.5 V
MDC IDC SET LEADCHNL RV SENSING SENSITIVITY: 4 mV

## 2016-04-03 MED ORDER — RIVAROXABAN (XARELTO) VTE STARTER PACK (15 & 20 MG): ORAL_TABLET | ORAL | 0 refills | Status: DC

## 2016-04-03 NOTE — Telephone Encounter (Signed)
Spoke to patient about L arm edema. She states that she first noticed the swelling on Saturday. She said that it's only painful in the "fatty part". I offered her an appt for today at 1500. She said that she will speak to her daughter and let us know if she can make it.

## 2016-04-03 NOTE — Progress Notes (Signed)
Wound check appointment. Steri-strips removed. Wound without redness or edema. Incision edges approximated, wound well healed.--left arm swollen and warm to touch. Pt reports pain to posterior upper arm. GT assessed - Xarelto initiated. Normal device function. Thresholds, sensing, and impedances consistent with implant measurements. Device programmed at 3.5V for extra safety margin until 3 month visit. Histogram distribution appropriate for patient and level of activity. No mode switches or high ventricular rates noted. Patient educated about wound care, arm mobility, lifting restrictions. ROV 2/1 with GT

## 2016-04-03 NOTE — Telephone Encounter (Signed)
Charlotte Henry is calling because she has some swelling from from her shoulder down to her hand . Had a pacemaker placed on 03/24/16 . Please call

## 2016-04-04 ENCOUNTER — Telehealth: Payer: Self-pay

## 2016-04-04 NOTE — Telephone Encounter (Signed)
kriston Geneticist, molecular at [facility] [address] attempted to send to [facility] but sent as PRINT and not NORMAL,Imesssaged her

## 2016-04-04 NOTE — Telephone Encounter (Signed)
I have handled this. A verbal order has been called into the pharmacist at [facility]    RE: refill on xarelto  Received: Today  Message Contents  Dionicio Stall, RN  Juventino Slovak, CMA        Was seen yesterday: It was for ? Clot in arm that's why its prescribed as it is:   Rivaroxaban 15 & 20 MG TBPK 90 each 0 04/03/2016  Sig: Take as directed on package: Start with one '15mg'$  tablet by mouth twice a day with food. On Day 22, switch to one '20mg'$  tablet once a day with food.  Class: Print     Wound check appointment. Steri-strips removed. Wound without redness or edema. Incision edges approximated, wound well healed. Left arm swellen and warm to touch. Pt reports pain to posterior upper arm. GT assessed - xarelto initiated. Normal device   Previous Messages    ----- Message -----  From: Juventino Slovak, CMA  Sent: 04/04/2016 12:13 PM  To: Dionicio Stall, RN  Subject: FW: refill on xarelto               Please advise. Thanks, MI  ----- Message -----  From: Bernita Raisin, RN  Sent: 04/04/2016 12:00 PM  To: Juventino Slovak, CMA  Subject: RE: refill on xarelto               The order for Xarelto,is giving pt both 15 and 20 mg.I do not see where Dr Lovena Le ever ordered drug  ----- Message -----  From: Juventino Slovak, CMA  Sent: 04/04/2016 11:44 AM  To: [facility]  Subject: FW: refill on xarelto                 ----- Message -----  From: Juluis Mire, RN  Sent: 04/04/2016  8:39 AM  To: [facility]  Subject: refill on xarelto                 Patient's daughter called and left a message on afib clinic machine requesting refills for xarelto be sent to [facility]. She is a patient of Dr. Lovena Le.  Thanks!  Stacy RN AFib

## 2016-04-04 NOTE — Telephone Encounter (Signed)
-----   Message from Charlotte Henry, [place] sent at 04/04/2016  8:50 AM EDT ----- Regarding: FW: refill on xarelto   ----- Message ----- From: Juluis Mire, RN Sent: 04/04/2016   8:39 AM To: [facility] Refill Subject: refill on xarelto                              Patient's daughter called and left a message on afib clinic machine requesting refills for xarelto be sent to [facility]. She is a patient of Dr. Lovena Le. Thanks! Stacy RN AFib

## 2016-04-04 NOTE — Telephone Encounter (Signed)
I called patient to let her know that the order has been called in to the pharmacy and she stated that she has already picked the medication up. I am unsure why the pharmacist didn't inform me of that before taking a verbal order from me.

## 2016-04-05 ENCOUNTER — Telehealth: Payer: Self-pay | Admitting: Internal Medicine

## 2016-04-05 ENCOUNTER — Other Ambulatory Visit: Payer: Self-pay

## 2016-04-05 MED ORDER — RIVAROXABAN (XARELTO) VTE STARTER PACK (15 & 20 MG): ORAL_TABLET | ORAL | 0 refills | Status: DC

## 2016-04-05 NOTE — Telephone Encounter (Signed)
Patient is worried about her lack of appetite and was wondering if she can take her medicine with Ensure.

## 2016-04-05 NOTE — Telephone Encounter (Signed)
Returned patient call and instructed patient to drink ensure prior to taking medication. I explained that if she began to have stomach upset she would need to take in a more substantial meal to help alleviate this.

## 2016-04-06 ENCOUNTER — Ambulatory Visit: Payer: Commercial Managed Care - HMO

## 2016-04-10 ENCOUNTER — Encounter: Payer: Commercial Managed Care - HMO | Admitting: Adult Health

## 2016-04-10 ENCOUNTER — Encounter: Payer: Self-pay | Admitting: Gastroenterology

## 2016-04-11 ENCOUNTER — Telehealth: Payer: Self-pay | Admitting: Family Medicine

## 2016-04-11 NOTE — Telephone Encounter (Signed)
[employer] auth for Cardiology visits (6)  04/24/16 - 10/21/16 for Dr Bronson Ing  Dx I44.1, I25.10, I73.9, I10, I25.118, R00.1, E78.5, I65.23 and R53.83 Auth # [id number]

## 2016-04-14 ENCOUNTER — Telehealth: Payer: Self-pay | Admitting: Internal Medicine

## 2016-04-14 NOTE — Telephone Encounter (Signed)
Pt would like to know when she will be able to get out and drive

## 2016-04-14 NOTE — Telephone Encounter (Signed)
Will forward to Ria Clock RN as I do not know the answer to pt's question

## 2016-04-14 NOTE — Telephone Encounter (Signed)
Spoke with patient regarding ability to drive. I informed her that as far as her device is concerned she is safe to do drive. However, with her recent arm swelling I encouraged her to wait until the swelling and gone down enough for her to have adequate range of motion that would keep her safe while driving. She stated that the swelling is improving, she is taking her xarelto as prescribed and she will continue to keep her arm elevated as much as possible.

## 2016-04-19 ENCOUNTER — Telehealth: Payer: Self-pay | Admitting: Pharmacist Clinician (PhC)/ Clinical Pharmacy Specialist

## 2016-04-19 NOTE — Telephone Encounter (Signed)
Patient c/o numbness, tingling and insomnia since starting xarelto.  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  Not sure where diagnosis was made.  Still has 5 days of bid dosing before going to 20 mg qd.  Advised that she continue medication, this recent after event would not want to switch anticoagulants.  Patient agreed to continue for another 1-2 weeks, if continued problems will call.

## 2016-04-24 ENCOUNTER — Encounter: Payer: Self-pay | Admitting: Cardiovascular Disease

## 2016-04-24 ENCOUNTER — Ambulatory Visit (INDEPENDENT_AMBULATORY_CARE_PROVIDER_SITE_OTHER): Payer: Commercial Managed Care - HMO | Admitting: Cardiovascular Disease

## 2016-04-24 VITALS — BP 104/64 | HR 65 | Ht 67.0 in | Wt 149.0 lb

## 2016-04-24 DIAGNOSIS — I6523 Occlusion and stenosis of bilateral carotid arteries: Secondary | ICD-10-CM | POA: Diagnosis not present

## 2016-04-24 DIAGNOSIS — Z716 Tobacco abuse counseling: Secondary | ICD-10-CM

## 2016-04-24 DIAGNOSIS — I739 Peripheral vascular disease, unspecified: Secondary | ICD-10-CM

## 2016-04-24 DIAGNOSIS — Z950 Presence of cardiac pacemaker: Secondary | ICD-10-CM

## 2016-04-24 DIAGNOSIS — I2510 Atherosclerotic heart disease of native coronary artery without angina pectoris: Secondary | ICD-10-CM | POA: Diagnosis not present

## 2016-04-24 DIAGNOSIS — I10 Essential (primary) hypertension: Secondary | ICD-10-CM | POA: Diagnosis not present

## 2016-04-24 DIAGNOSIS — E7800 Pure hypercholesterolemia, unspecified: Secondary | ICD-10-CM | POA: Diagnosis not present

## 2016-04-24 NOTE — Patient Instructions (Addendum)
Your physician wants you to follow-up in: July 2018 with Dr Virgina Jock will receive a reminder letter in the mail two months in advance. If you don't receive a letter, please call our office to schedule the follow-up appointment.    Your physician recommends that you continue on your current medications as directed. Please refer to the Current Medication list given to you today.    Thank you for choosing [facility] !

## 2016-04-24 NOTE — Progress Notes (Signed)
SUBJECTIVE: The patient presents for follow-up of coronary artery disease. She underwent pacemaker implantation for complete heart block in October 2017. She underwent drug-eluting stent placement to the LAD and RCA in 2004. She also has a history of diabetes mellitus, tobacco abuse, hypertension, irritable bowel syndrome, GERD, PAD, and hyperlipidemia.   Denies chest pain, palps, SOB, dizziness, and syncope.   Review of Systems: As per "subjective", otherwise negative.  Allergies  Allergen Reactions  . Ace Inhibitors Hives, Shortness Of Breath and Swelling  . Aspirin Other (See Comments)    Blood in stool and nose bleed  . Codeine Swelling  . Pineapple Shortness Of Breath and Swelling  . Plasticized Base [Plastibase] Hives and Swelling    No plastics  . Ultram [Tramadol] Swelling  . Adhesive [Tape] Swelling and Rash  . Naprosyn [Naproxen] Swelling  . Clindamycin/Lincomycin Other (See Comments)    Burning sensation throat, abdomen  . Linzess [Linaclotide]     DIARRHEA, FECAL INCONTINENCE  . Nsaids   . Penicillins     Lip swelling, Hives Has patient had a PCN reaction causing immediate rash, facial/tongue/throat swelling, SOB or lightheadedness with hypotension:YES Has patient had a PCN reaction causing severe rash involving mucus membranes or skin necrosis: NO Has patient had a PCN reaction that required hospitalization NO Has patient had a PCN reaction occurring within the last 10 years: NO If all of the above answers are "NO", then may proceed with Cephalosporin use.  . Varenicline Tartrate Swelling    Current Outpatient Prescriptions  Medication Sig Dispense Refill  . acetaminophen (TYLENOL) 500 MG tablet Take 500 mg by mouth every 6 (six) hours as needed for mild pain or moderate pain.    Marland Kitchen albuterol (PROVENTIL HFA;VENTOLIN HFA) 108 (90 Base) MCG/ACT inhaler Inhale 2 puffs into the lungs every 4 (four) hours as needed for wheezing or shortness of breath. 1 Inhaler  2  . ALPRAZolam (XANAX) 0.25 MG tablet TAKE 1 TABLET BY MOUTH AT BEDTIME AS NEEDED. 30 tablet 1  . calcium citrate-vitamin D (CITRACAL+D) 315-200 MG-UNIT per tablet Take 1 tablet by mouth daily.      . Cranberry 250 MG CAPS Take 250 mg by mouth daily.    . Cyanocobalamin (VITAMIN B 12 PO) Take 1 tablet by mouth daily.     Marland Kitchen EPINEPHrine 0.3 mg/0.3 mL IJ SOAJ injection Inject 0.3 mLs (0.3 mg total) into the muscle once. 1 Device 3  . esomeprazole (NEXIUM) 40 MG capsule TAKE 1 CAPSULE BY MOUTH ONCE DAILY FOR STOMACH. 30 capsule 0  . Fluticasone-Salmeterol (ADVAIR) 100-50 MCG/DOSE AEPB Inhale 1 puff into the lungs 2 (two) times daily. (Patient taking differently: Inhale 1 puff into the lungs 2 (two) times daily as needed. ) 1 each 3  . lidocaine-prilocaine (EMLA) cream Apply 1 application topically 3 times/day as needed-between meals & bedtime.     Marland Kitchen lubiprostone (AMITIZA) 8 MCG capsule Take 1 capsule (8 mcg total) by mouth 2 (two) times daily with a meal. 60 capsule 1  . mirabegron ER (MYRBETRIQ) 25 MG TB24 tablet Take 1 tablet (25 mg total) by mouth at bedtime. For bladder 30 tablet 6  . mirtazapine (REMERON) 7.5 MG tablet 1 PO QHS (Patient taking differently: Take 7.5 mg by mouth at bedtime as needed. ) 30 tablet 5  . pravastatin (PRAVACHOL) 20 MG tablet Take 1 tablet (20 mg total) by mouth daily. 30 tablet 3  . Probiotic Product (PROBIOTIC DAILY PO) Take 1 capsule by mouth  daily.     . Rivaroxaban 15 & 20 MG TBPK Take as directed on package: Start with one '15mg'$  tablet by mouth twice a day with food. On Day 22, switch to one '20mg'$  tablet once a day with food. 91 each 0  . Tiotropium Bromide Monohydrate (SPIRIVA RESPIMAT) 1.25 MCG/ACT AERS Inhale 2 puffs into the lungs daily. (Patient taking differently: Inhale 2 puffs into the lungs daily as needed. ) 1 Inhaler 11  . valsartan (DIOVAN) 160 MG tablet TAKE 1 TABLET BY MOUTH DAILY. 90 tablet 0  . ZETIA 10 MG tablet TAKE 1 TABLET BY MOUTH ONCE A DAY. 30  tablet 6   No current facility-administered medications for this visit.     Past Medical History:  Diagnosis Date  . 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 ASCVD (arteriosclerotic cardiovascular disease)    a. cath in 4/04,-80%LAD; 70% RCA, -> DESx2; residual 60% distal RCA; nl EF  b. Nuc 02/2016 aborted due to bradycardia. Will need to be rescheduled   . Carotid artery occlusion   . Cerebrovascular disease   . CHB (complete heart block) ([place])    a. s/p PPM on 03/24/16 with a St. Jude (serial number [id number]) pacemaker  . Chronic kidney disease (CKD), stage III (moderate)   . Clostridium difficile colitis 03/2013   a. 03/2013  . COPD (chronic obstructive pulmonary disease) ([place])   . Coronary disease   . Diabetes mellitus   . Diverticulosis   . GERD (gastroesophageal reflux disease)   . Gout   . Hyperlipidemia   . Hypertension   . PVD (peripheral vascular disease) ([place])    a. moderate to severely decreased ABI's in 8/08, sig. aortic inflow disease b. repeat ABI's in 2011 improved- mild disease on the left and moderate to severe disease on the right  . S/P placement of cardiac pacemaker    a. 03/24/16: St. Jude (serial number [id number]) pacemaker  . TIA (transient ischemic attack) 2012  . Tobacco abuse    a. 50 pack years continuing at 1/2 pack per day    Past Surgical History:  Procedure Laterality Date  . ABDOMINAL HYSTERECTOMY    . carpel tunnel release     bilateral  . CHOLECYSTECTOMY    . COLONOSCOPY  2009  . EP IMPLANTABLE DEVICE N/A 03/24/2016   Procedure: Pacemaker Implant;  Surgeon: [name], MD;  Location: [facility];  Service: Cardiovascular;  Laterality: N/A;  . TOTAL ABDOMINAL HYSTERECTOMY W/ BILATERAL SALPINGOOPHORECTOMY      Social History   Social History  . Marital status: Widowed    Spouse name: N/A  . Number of children: N/A  . Years of education: N/A   Occupational History  . Not on file.   Social History Main Topics  . Smoking  status: Current Every Day Smoker    Packs/day: 0.50    Years: 50.00    Types: Cigarettes    Start date: 02/02/1966  . Smokeless tobacco: Never Used     Comment: 1/2 pack daily  . Alcohol use No  . Drug use: No  . Sexual activity: Not Currently   Other Topics Concern  . Not on file   Social History Narrative  . No narrative on file     Vitals:   04/24/16 1440  BP: 104/64  Pulse: 65  SpO2: 98%  Weight: 149 lb (67.6 kg)  Height: '5\' 7"'$  (1.702 m)    PHYSICAL EXAM General: NAD HEENT: Normal. Neck: No JVD,  no thyromegaly. Lungs: Clear to auscultation bilaterally with normal respiratory effort. CV: Nondisplaced PMI.  Regular rate and rhythm, normal S1/S2, no S3/S4, no murmurs. No pretibial or periankle edema. No bruits. Abdomen: Soft, nontender, no distention.  Neurologic: Alert and oriented.  Psych: Normal affect. Skin: Normal. Musculoskeletal: No gross deformities.    ECG: Most recent ECG reviewed.      ASSESSMENT AND PLAN: 1. CAD: Stable. Continue aspirin, Zetia, and pravastatin.   2. Essential HTN: Controlled. No changes.  3. Hyperlipidemia: No change in current therapy which includes Zetia and pravastatin.   4. CHB s/p PPM: Stable. Routine monitoring in device clinic.  5. Peripheral vascular disease: ABIs 01/17/16 right 0.37, left 0.41. Followed by vascular surgery. Continue ASA and statin. Tobacco cessation counseling given.  6. Tobacco abuse disorder: Tobacco cessation counseling given (1 minute).   Dispo: f/u July 2018   Kate Sable, M.D., F.A.C.C.

## 2016-05-02 ENCOUNTER — Telehealth: Payer: Self-pay | Admitting: Internal Medicine

## 2016-05-02 ENCOUNTER — Other Ambulatory Visit: Payer: Self-pay

## 2016-05-02 MED ORDER — RIVAROXABAN 20 MG PO TABS: 20.0000 mg | ORAL_TABLET | Freq: Every day | ORAL | 11 refills | Status: DC

## 2016-05-02 NOTE — Telephone Encounter (Signed)
Called, spoke with pt. Pt stated she had called earlier for Rx of Xarelto. Med was called in earlier and pt picked it up. Issue solved.

## 2016-05-02 NOTE — Telephone Encounter (Signed)
New message  Xarelto '20mg'$  once daily  Pt wants to know if she needs new Rx/refill?  Please call pt back and advise

## 2016-05-04 ENCOUNTER — Other Ambulatory Visit: Payer: Self-pay | Admitting: Family Medicine

## 2016-05-04 DIAGNOSIS — Z1231 Encounter for screening mammogram for malignant neoplasm of breast: Secondary | ICD-10-CM

## 2016-05-18 ENCOUNTER — Other Ambulatory Visit: Payer: Self-pay

## 2016-05-18 ENCOUNTER — Ambulatory Visit (INDEPENDENT_AMBULATORY_CARE_PROVIDER_SITE_OTHER): Payer: Commercial Managed Care - HMO | Admitting: Gastroenterology

## 2016-05-18 ENCOUNTER — Encounter: Payer: Self-pay | Admitting: Gastroenterology

## 2016-05-18 VITALS — BP 128/73 | HR 59 | Temp 97.9°F | Ht 67.0 in | Wt 149.0 lb

## 2016-05-18 DIAGNOSIS — K219 Gastro-esophageal reflux disease without esophagitis: Secondary | ICD-10-CM

## 2016-05-18 DIAGNOSIS — R634 Abnormal weight loss: Secondary | ICD-10-CM

## 2016-05-18 DIAGNOSIS — A048 Other specified bacterial intestinal infections: Secondary | ICD-10-CM

## 2016-05-18 DIAGNOSIS — K581 Irritable bowel syndrome with constipation: Secondary | ICD-10-CM

## 2016-05-18 DIAGNOSIS — Z1211 Encounter for screening for malignant neoplasm of colon: Secondary | ICD-10-CM

## 2016-05-18 NOTE — Patient Instructions (Addendum)
DRINK WATER TO KEEP YOUR URINE LIGHT YELLOW.  FOLLOW A HIGH FIBER DIET. AVOID ITEMS THAT CAUSE BLOATING & GAS.  USE AMITIZA IF NEEDED TO TREAT CONSTIPATION.   COMPLETE H PYLORI BREATH TEST TO CONFIRM H PYLORI IS GONE. YOU WERE TREATED 10 YR AGO.  NO ROUTINE SCREENING COLONOSCOPY AFTER AGE 75.  FOLLOW UP IN 6 MOS.

## 2016-05-18 NOTE — Assessment & Plan Note (Signed)
WEIGHT FAIRLY STABLE OVER PAST 12 MOS. PT DOESN'T HAVE AN APPETITE. LAST EGD/TCS 2007.  CONTINUE TO MONITOR SYMPTOMS.

## 2016-05-18 NOTE — Progress Notes (Signed)
CC'ED TO PCP 

## 2016-05-18 NOTE — Assessment & Plan Note (Signed)
CONTROLLED WITH DIET.  CONTINUE TO MONITOR SYMPTOMS.

## 2016-05-18 NOTE — Progress Notes (Signed)
Subjective:    Patient ID: Charlotte Henry, female    DOB: 07/05/1940, 75 y.o.   MRN: [id number]  [name], MD   HPI Takes one Amitiza with ensure but doesn't work. [employer]. HER BREAKFAST. TAKING BLOOD CLOT MEDICINE. IF TAKES TWO HAS DIARRHEA. BOWELS MOVE: DAILY IF REGULATED. RIGHT NOW SHE'S GOOD FOR PAST 2 WEEKS. HAD PACEMAKER PLACED CAME HOME A BLISTERED ACROSS CHEST AND COMPLICATED BY CLOT IN LEFT ARM.  BREATHING OK. NOT USING ADVAIR. WEIGHT DOWN FROM 156 LBS IN DEC 2016. WAS 147 LBS WHEN SHE WENT IN HOSPITAL FOR PACEMAKER. HAD URI LAST WEEKEND. STILL DRIVING.   PT DENIES FEVER, CHILLS, HEMATOCHEZIA, nausea, vomiting, melena, CHEST PAIN, SHORTNESS OF BREATH,  CHANGE IN BOWEL IN HABITS, abdominal pain, problems swallowing, OR heartburn or indigestion.   Past Medical History:  Diagnosis Date  . 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 ASCVD (arteriosclerotic cardiovascular disease)    a. cath in 4/04,-80%LAD; 70% RCA, -> DESx2; residual 60% distal RCA; nl EF  b. Nuc 02/2016 aborted due to bradycardia. Will need to be rescheduled   . Carotid artery occlusion   . Cerebrovascular disease   . CHB (complete heart block) ([place])    a. s/p PPM on 03/24/16 with a St. Jude (serial number [id number]) pacemaker  . Chronic kidney disease (CKD), stage III (moderate)   . Clostridium difficile colitis 03/2013   a. 03/2013  . COPD (chronic obstructive pulmonary disease) ([place])   . Coronary disease   . Diabetes mellitus   . Diverticulosis   . GERD (gastroesophageal reflux disease)   . Gout   . Hyperlipidemia   . Hypertension   . PVD (peripheral vascular disease) ([place])    a. moderate to severely decreased ABI's in 8/08, sig. aortic inflow disease b. repeat ABI's in 2011 improved- mild disease on the left and moderate to severe disease on the right  . S/P placement of cardiac pacemaker    a. 03/24/16: St. Jude (serial number [id number]) pacemaker  . TIA (transient ischemic attack) 2012  . Tobacco  abuse    a. 50 pack years continuing at 1/2 pack per day    Past Surgical History:  Procedure Laterality Date  . ABDOMINAL HYSTERECTOMY    . carpel tunnel release     bilateral  . CHOLECYSTECTOMY    . COLONOSCOPY  2009  . EP IMPLANTABLE DEVICE N/A 03/24/2016   Procedure: Pacemaker Implant;  Surgeon: [name], MD;  Location: [facility];  Service: Cardiovascular;  Laterality: N/A;  . TOTAL ABDOMINAL HYSTERECTOMY W/ BILATERAL SALPINGOOPHORECTOMY      Allergies  Allergen Reactions  . Ace Inhibitors Hives, Shortness Of Breath and Swelling  . Aspirin Other (See Comments)    Blood in stool and nose bleed  . Codeine Swelling  . Pineapple Shortness Of Breath and Swelling  . Plasticized Base [Plastibase] Hives and Swelling    No plastics  . Ultram [Tramadol] Swelling  . Adhesive [Tape] Swelling and Rash  . Naprosyn [Naproxen] Swelling  . Clindamycin/Lincomycin Other (See Comments)    Burning sensation throat, abdomen  . Linzess [Linaclotide]     DIARRHEA, FECAL INCONTINENCE  . Nsaids   . Penicillins       . Varenicline Tartrate Swelling   Allergies  Allergen Reactions  . Ace Inhibitors Hives, Shortness Of Breath and Swelling  . Aspirin Other (See Comments)    Blood in stool and nose bleed  . Codeine Swelling  . Pineapple Shortness Of  Breath and Swelling  . Plasticized Base [Plastibase] Hives and Swelling    No plastics  . Ultram [Tramadol] Swelling  . Adhesive [Tape] Swelling and Rash  . Naprosyn [Naproxen] Swelling  . Clindamycin/Lincomycin Other (See Comments)    Burning sensation throat, abdomen  . Linzess [Linaclotide]     DIARRHEA, FECAL INCONTINENCE  . Nsaids   . Penicillins       . Varenicline Tartrate Swelling    Current Outpatient Prescriptions  Medication Sig Dispense Refill  . acetaminophen (TYLENOL) 500 MG tablet Take 500 mg by mouth every 6 (six) hours as needed for mild pain or moderate pain.    Marland Kitchen albuterol (PROVENTIL HFA;VENTOLIN HFA)  108 (90 Base) MCG/ACT inhaler Inhale 2 puffs into the lungs every 4 (four) hours as needed for wheezing or shortness of breath.    . ALPRAZolam (XANAX) 0.25 MG tablet TAKE 1 TABLET BY MOUTH AT BEDTIME AS NEEDED.    . calcium citrate-vitamin D (CITRACAL+D) 315-200 MG-UNIT per tablet Take 1 tablet by mouth daily.      . Cranberry 250 MG CAPS Take 250 mg by mouth daily.    . (VITAMIN B 12 PO) Take 1 tablet by mouth daily.     Marland Kitchen EPINEPHrine 0.3 mg/0.3 mL IJ SOAJ injection Inject 0.3 mLs (0.3 mg total) into the muscle once.    .      . Fluticasone-Salmeterol (ADVAIR) 100-50 MCG/DOSE AEPB Inhale 1 puff into the lungs 2 (two) times daily. (Patient taking differently: Inhale 1 puff into the lungs 2 (two) times daily as needed. )    . lidocaine-prilocaine (EMLA) cream Apply 1 application topically 3 times/day as needed-between meals & bedtime.     . mirabegron ER (MYRBETRIQ) 25 MG TB24 tablet Take 1 tablet (25 mg total) by mouth at bedtime. For bladder    . mirtazapine (REMERON) 7.5 MG tablet 1 PO QHS (Patient taking differently: Take 7.5 mg by mouth at bedtime as needed. )    . pravastatin (PRAVACHOL) 20 MG tablet Take 1 tablet (20 mg total) by mouth daily.    . Probiotic Product (PROBIOTIC DAILY PO) Take 1 capsule by mouth daily.     . rivaroxaban (XARELTO) 20 MG TABS tablet Take 1 tablet (20 mg total) by mouth daily with supper.    . Tiotropium Bromide Monohydrate (SPIRIVA RESPIMAT) 1.25 MCG/ACT AERS Inhale 2 puffs into the lungs daily. (Patient taking differently: Inhale 2 puffs into the lungs daily as needed. )    . valsartan (DIOVAN) 160 MG tablet TAKE 1 TABLET BY MOUTH DAILY.    Marland Kitchen ZETIA 10 MG tablet TAKE 1 TABLET BY MOUTH ONCE A DAY.    Marland Kitchen lubiprostone (AMITIZA) 8 MCG capsule Take 1 capsule (8 mcg total) by mouth 2 (two) times daily with a meal. LAST DOSE LAST WEEK    Review of Systems PER HPI OTHERWISE ALL SYSTEMS ARE NEGATIVE.    Objective:   Physical Exam  Constitutional: She is oriented to  person, place, and time. She appears well-developed and well-nourished. No distress.  HENT:  Head: Normocephalic and atraumatic.  Mouth/Throat: Oropharynx is clear and moist. No oropharyngeal exudate.  Eyes: Pupils are equal, round, and reactive to light. No scleral icterus.  Neck: Normal range of motion. Neck supple.  Cardiovascular: Normal rate, regular rhythm and normal heart sounds.   Pulmonary/Chest: Effort normal and breath sounds normal. No respiratory distress.  PACER POCKET IN LEFT UPPER CHEST  Abdominal: Soft. Bowel sounds are normal. She exhibits no distension.  There is no tenderness.  Musculoskeletal: She exhibits no edema.  Lymphadenopathy:    She has no cervical adenopathy.  Neurological: She is alert and oriented to person, place, and time.  NO  NEW FOCAL DEFICITS  Psychiatric: She has a normal mood and affect.  Vitals reviewed.     Assessment & Plan:

## 2016-05-18 NOTE — Assessment & Plan Note (Signed)
SYMPTOMS FAIRLY WELL CONTROLLED WITH PRN AMITIZA.  DRINK WATER TO KEEP YOUR URINE LIGHT YELLOW. FOLLOW A HIGH FIBER DIET. AVOID ITEMS THAT CAUSE BLOATING & GAS. USE AMITIZA IF NEEDED TO TREAT CONSTIPATION.  FOLLOW UP IN 6 MOS.

## 2016-05-19 NOTE — Progress Notes (Signed)
ON RECALL  °

## 2016-05-26 ENCOUNTER — Other Ambulatory Visit: Payer: Self-pay | Admitting: Family Medicine

## 2016-06-02 ENCOUNTER — Ambulatory Visit (HOSPITAL_COMMUNITY): Payer: Commercial Managed Care - HMO

## 2016-06-02 DIAGNOSIS — A048 Other specified bacterial intestinal infections: Secondary | ICD-10-CM | POA: Diagnosis not present

## 2016-06-06 ENCOUNTER — Encounter: Payer: Self-pay | Admitting: Gastroenterology

## 2016-06-06 ENCOUNTER — Telehealth: Payer: Self-pay | Admitting: Internal Medicine

## 2016-06-06 LAB — H. PYLORI BREATH TEST: H. PYLORI BREATH TEST: DETECTED — AB

## 2016-06-06 NOTE — Telephone Encounter (Signed)
Pt c/o medication issue:  1. Name of Medication: Xarelto  2. How are you currently taking this medication (dosage and times per day)? 1 '20mg'$  qd at night  3. Are you having a reaction (difficulty breathing--STAT)? Weakness, sore muscles and falling down x 2 weeks  4. What is your medication issue? Thinks symptoms coming from med  pls call [phone]

## 2016-06-06 NOTE — Telephone Encounter (Signed)
Called, spoke with pt. Pt stated she has had weak muscles and has fallen in the past 2-3 weeks. Pt denied head injury.  Pt concerned she is having side effects from the Xarelto. I spoke with the Pharmacist here, Megan Supple. The Pharmacist stated pt should follow-up with PCP. Relayed information to pt. Pt verbalized understanding and agreed with plan.

## 2016-06-14 ENCOUNTER — Ambulatory Visit (INDEPENDENT_AMBULATORY_CARE_PROVIDER_SITE_OTHER): Payer: Commercial Managed Care - HMO | Admitting: *Deleted

## 2016-06-14 ENCOUNTER — Encounter: Payer: Self-pay | Admitting: Family Medicine

## 2016-06-14 VITALS — BP 110/68 | HR 68 | Temp 98.2°F | Resp 14 | Ht 67.0 in | Wt 148.0 lb

## 2016-06-14 DIAGNOSIS — M150 Primary generalized (osteo)arthritis: Secondary | ICD-10-CM

## 2016-06-14 DIAGNOSIS — J4521 Mild intermittent asthma with (acute) exacerbation: Secondary | ICD-10-CM | POA: Diagnosis not present

## 2016-06-14 DIAGNOSIS — I10 Essential (primary) hypertension: Secondary | ICD-10-CM

## 2016-06-14 DIAGNOSIS — M791 Myalgia, unspecified site: Secondary | ICD-10-CM

## 2016-06-14 DIAGNOSIS — E1159 Type 2 diabetes mellitus with other circulatory complications: Secondary | ICD-10-CM | POA: Diagnosis not present

## 2016-06-14 DIAGNOSIS — R29898 Other symptoms and signs involving the musculoskeletal system: Secondary | ICD-10-CM

## 2016-06-14 DIAGNOSIS — J4520 Mild intermittent asthma, uncomplicated: Secondary | ICD-10-CM

## 2016-06-14 DIAGNOSIS — G6289 Other specified polyneuropathies: Secondary | ICD-10-CM

## 2016-06-14 DIAGNOSIS — M159 Polyosteoarthritis, unspecified: Secondary | ICD-10-CM

## 2016-06-14 DIAGNOSIS — M8949 Other hypertrophic osteoarthropathy, multiple sites: Secondary | ICD-10-CM

## 2016-06-14 LAB — CBC WITH DIFFERENTIAL/PLATELET
BASOS ABS: 0 {cells}/uL (ref 0–200)
Basophils Relative: 0 %
EOS PCT: 2 %
Eosinophils Absolute: 142 cells/uL (ref 15–500)
HCT: 32.8 % — ABNORMAL LOW (ref 35.0–45.0)
Hemoglobin: 10.3 g/dL — ABNORMAL LOW (ref 12.0–15.0)
LYMPHS ABS: 2840 {cells}/uL (ref 850–3900)
Lymphocytes Relative: 40 %
MCH: 29 pg (ref 27.0–33.0)
MCHC: 31.4 g/dL — AB (ref 32.0–36.0)
MCV: 92.4 fL (ref 80.0–100.0)
MONOS PCT: 8 %
MPV: 8.9 fL (ref 7.5–12.5)
Monocytes Absolute: 568 cells/uL (ref 200–950)
NEUTROS ABS: 3550 {cells}/uL (ref 1500–7800)
NEUTROS PCT: 50 %
PLATELETS: 287 10*3/uL (ref 140–400)
RBC: 3.55 MIL/uL — AB (ref 3.80–5.10)
RDW: 14 % (ref 11.0–15.0)
WBC: 7.1 10*3/uL (ref 3.8–10.8)

## 2016-06-14 MED ORDER — GABAPENTIN 100 MG PO CAPS: ORAL_CAPSULE | ORAL | 2 refills | Status: DC

## 2016-06-14 MED ORDER — TIOTROPIUM BROMIDE MONOHYDRATE 1.25 MCG/ACT IN AERS: 2.0000 | INHALATION_SPRAY | Freq: Every day | RESPIRATORY_TRACT | 11 refills | Status: DC

## 2016-06-14 MED ORDER — ALPRAZOLAM 0.25 MG PO TABS: 0.2500 mg | ORAL_TABLET | Freq: Every evening | ORAL | 1 refills | Status: DC | PRN

## 2016-06-14 NOTE — Assessment & Plan Note (Signed)
Sign of any acute exacerbation discussed using her inhaler I have this and again. She is also working on quitting tobacco does not want to do nicotine patches these made her sick she did not like the medications in the past. Agreed that she could try Nicorette gum

## 2016-06-14 NOTE — Assessment & Plan Note (Signed)
She has known peripheral neuropathy as well as peripheral vascular disease. Has been treated in the past. We'll likely restart the gabapentin but I will check her A1c she does have diabetes mellitus which is currently diet controlled. We'll also check her metabolic panel in the CK ESR levels with regards to her upper extremities to make sure that there is no type of myositis going on. I did mention going to orthopedics for her shoulder she may actually have rotator cuff injury however she declines this. She R has known osteoarthritis. She did agree to go into physical therapy to help with strengthening in general. Also agreed that I did not think that this is coming from [place]

## 2016-06-14 NOTE — Progress Notes (Signed)
Subjective:    Patient ID: Charlotte Henry, female    DOB: 12/02/1940, 76 y.o.   MRN: [id number]  Patient presents for Weakness (x 2 months- states that she is having weakness and decreased ROM in BUE- states that she thins Xarelto is causing SE) and Neuropathy (stinging and burning in BLE when going to bed at night)  During pre-op for vascular for PVD surgery  noted to have AV block sent to cardiology, had pacemaker placed then had blood clot in left arm, now on Xarelto, but still having weakness both arms. She thought that her weakness in her arms was due to her blood thinner. She's been advised that this is not the case. She has pain when she tries to lift them above shoulder height level on both sides. She does have actual shoulder pain on the left side but refuses to go to orthopedics for evaluation. States her arms just feels so weak that she can hardly get herself dressed in the morning and feels like she needs help to do things that she could typically do by herself. This is been worse over the past month since her surgery  Has stinging and , itching burning in both legs at bedtime, using lidoderm patch on right leg , symptoms started  1 month ago. She has underlying peripheral neuropathy has been on gabapentin in the past when she stopped this on her own.  Cough with clear mucous at night-time,still smoking cigarrettes has underllying COPD, not using spiriva  Continues to smoke   Appetite still down, took, remeron a few months from GI- but stopped didn't feel like it was helping Taking probiotics for bowels. He was noted to have positive H. pylori which she asked about today she had not heard back. I do not see any notation regarding treatment therefore will message her gastroenterology about this   Review Of Systems:  GEN- denies fatigue, fever, weight loss,weakness, recent illness HEENT- denies eye drainage, change in vision, nasal discharge, CVS- denies chest pain,  palpitations RESP- denies SOB, cough, wheeze ABD- denies N/V, change in stools, abd pain GU- denies dysuria, hematuria, dribbling, incontinence MSK-+ joint pain, muscle aches, injury Neuro- denies headache, dizziness, syncope, seizure activity       Objective:    BP 110/68 (BP Location: Right Arm, Patient Position: Sitting, Cuff Size: Normal)   Pulse 68   Temp 98.2 F (36.8 C) (Oral)   Resp 14   Ht '5\' 7"'  (1.702 m)   Wt 148 lb (67.1 kg)   SpO2 99%   BMI 23.18 kg/m  GEN- NAD, alert and oriented x3 HEENT- PERRL, EOMI, non injected sclera, pink conjunctiva, MMM, oropharynx clear Neck- Supple, fair ROM  CVS- RRR, systolic murmur RESP-CTAB ABD-NABS,soft,NT,ND MSK- Spine NT, decreased ROM Upper ext , neg SLR + pain with empty can left side, biceps in tact  EXT- No edema, decreased monofilament bilat  Pulses- Radial, DP- decreased +        Assessment & Plan:    I contacted her gastroenterologist with regards to the positive H. pylori in need for treatment    Problem List Items Addressed This Visit    Peripheral neuropathy ([place])    She has known peripheral neuropathy as well as peripheral vascular disease. Has been treated in the past. We'll likely restart the gabapentin but I will check her A1c she does have diabetes mellitus which is currently diet controlled. We'll also check her metabolic panel in the CK ESR levels with  regards to her upper extremities to make sure that there is no type of myositis going on. I did mention going to orthopedics for her shoulder she may actually have rotator cuff injury however she declines this. She R has known osteoarthritis. She did agree to go into physical therapy to help with strengthening in general. Also agreed that I did not think that this is coming from Xarelto      Relevant Medications   ALPRAZolam (XANAX) 0.25 MG tablet   gabapentin (NEURONTIN) 100 MG capsule   Essential hypertension    Controlled, she is not symptomatic with  current BP, will monitor      Relevant Orders   Lipid panel   Diabetes mellitus ([place])   Relevant Orders   Comprehensive metabolic panel   CBC with Differential/Platelet   Hemoglobin A1c   Asthmatic bronchitis - Primary    Sign of any acute exacerbation discussed using her inhaler I have this and again. She is also working on quitting tobacco does not want to do nicotine patches these made her sick she did not like the medications in the past. Agreed that she could try Nicorette gum      Relevant Medications   Tiotropium Bromide Monohydrate (SPIRIVA RESPIMAT) 1.25 MCG/ACT AERS    Other Visit Diagnoses    Asthmatic bronchitis, mild intermittent, with acute exacerbation       Relevant Medications   Tiotropium Bromide Monohydrate (SPIRIVA RESPIMAT) 1.25 MCG/ACT AERS   Myalgia       Relevant Orders   CK   Sedimentation rate      Note: This dictation was prepared with Dragon dictation along with smaller phrase technology. Any transcriptional errors that result from this process are unintentional.

## 2016-06-14 NOTE — Patient Instructions (Addendum)
Okay to take the Tylenol Okay to take xanax  Do not start gabapentin yet Positive H pylori  F/U 1 month

## 2016-06-14 NOTE — Assessment & Plan Note (Signed)
Controlled, she is not symptomatic with current BP, will monitor

## 2016-06-15 LAB — COMPREHENSIVE METABOLIC PANEL
ALK PHOS: 43 U/L (ref 33–130)
ALT: 10 U/L (ref 6–29)
AST: 21 U/L (ref 10–35)
Albumin: 3.9 g/dL (ref 3.6–5.1)
BILIRUBIN TOTAL: 0.4 mg/dL (ref 0.2–1.2)
BUN: 17 mg/dL (ref 7–25)
CALCIUM: 9.5 mg/dL (ref 8.6–10.4)
CO2: 25 mmol/L (ref 20–31)
Chloride: 108 mmol/L (ref 98–110)
Creat: 0.95 mg/dL — ABNORMAL HIGH (ref 0.60–0.93)
GLUCOSE: 94 mg/dL (ref 70–99)
POTASSIUM: 4.7 mmol/L (ref 3.5–5.3)
Sodium: 142 mmol/L (ref 135–146)
TOTAL PROTEIN: 6.8 g/dL (ref 6.1–8.1)

## 2016-06-15 LAB — HEMOGLOBIN A1C
HEMOGLOBIN A1C: 5.6 % (ref <5.7)
MEAN PLASMA GLUCOSE: 114 mg/dL

## 2016-06-15 LAB — LIPID PANEL
Cholesterol: 134 mg/dL (ref <200)
HDL: 54 mg/dL (ref >50)
LDL CALC: 63 mg/dL (ref <100)
Total CHOL/HDL Ratio: 2.5 Ratio (ref <5.0)
Triglycerides: 86 mg/dL (ref <150)
VLDL: 17 mg/dL (ref <30)

## 2016-06-15 LAB — CK: CK TOTAL: 109 U/L (ref 7–177)

## 2016-06-15 LAB — SEDIMENTATION RATE: SED RATE: 36 mm/h — AB (ref 0–30)

## 2016-06-16 ENCOUNTER — Telehealth: Payer: Self-pay | Admitting: Gastroenterology

## 2016-06-16 ENCOUNTER — Telehealth: Payer: Self-pay | Admitting: *Deleted

## 2016-06-16 MED ORDER — RIVAROXABAN 10 MG PO TABS: 10.0000 mg | ORAL_TABLET | Freq: Every day | ORAL | 0 refills | Status: DC

## 2016-06-16 MED ORDER — ESOMEPRAZOLE MAGNESIUM 40 MG PO CPDR: 40.0000 mg | DELAYED_RELEASE_CAPSULE | Freq: Two times a day (BID) | ORAL | 0 refills | Status: DC

## 2016-06-16 MED ORDER — METRONIDAZOLE 500 MG PO TABS: ORAL_TABLET | ORAL | 0 refills | Status: DC

## 2016-06-16 MED ORDER — CLARITHROMYCIN 500 MG PO TABS: ORAL_TABLET | ORAL | 0 refills | Status: DC

## 2016-06-16 NOTE — Telephone Encounter (Signed)
Dr. Oneida Alar, please see result note and advise!

## 2016-06-16 NOTE — Telephone Encounter (Signed)
noted 

## 2016-06-16 NOTE — Telephone Encounter (Signed)
Pt called to say that she has H pylori and wanted to speak with the nurse. Please call her at [phone]

## 2016-06-16 NOTE — Telephone Encounter (Signed)
Called patient TO EXPLAIN MEDICATION REGIMEN. BXN/NEXIUM BID WITH FLAGYL TID FOR 14 DAYS. REDUCE XARELTO TO 10 MG DAILY WHILE ON ABX. RX SENT FOR ALL MEDS. PT VOICED HER UNDERSTANDING. WILL CALL WITH QUESTIONS OR CONCERNS.

## 2016-06-16 NOTE — Telephone Encounter (Signed)
Received call from patient.   Reports that she picked up Spiriva Respimat, but noted that inhaler looked different. States that she has been taking 1.28mg/ act, but the new one is 2.552m/ act.   Call placed to pharmacy. Was advised that this is not the correct dosage. New inhaler will be sent out to patient.   MD to be made aware.

## 2016-06-16 NOTE — Addendum Note (Signed)
Addended by: Danie Binder on: 06/16/2016 01:02 PM   Modules accepted: Orders

## 2016-06-16 NOTE — Telephone Encounter (Addendum)
Called patient TO DISCUSS RESULTS. EXPLAINED CHALLENGE IN TREATING HER H PYLORI DUE TO RECORDS OF Rx FROM 2007 NOT AVAILABLE, BUT GIVEN HER PCN ALLERGY, PT MOST LIKELY RECEIVED PYLERA. CONSIDER NG SHE HAS HAD QUINOLONE EXPOSURE AND HAS A PCN ALLERGY THE APPROPRIATE SALVAGE THERAPY WOULD BE BID PPI WITH BIAXIN AND METRONIDAZOLE @ TO [facility]CG GUIDELINES FEB 2017.

## 2016-06-19 ENCOUNTER — Encounter: Payer: Self-pay | Admitting: Family Medicine

## 2016-06-20 ENCOUNTER — Telehealth: Payer: Self-pay | Admitting: Gastroenterology

## 2016-06-20 NOTE — Telephone Encounter (Signed)
Pt called to speak with DS. I told her that DS was at lunch and I could transfer her to VM. Patient said that she is nauseated, but not throwing up just spitting up. She would like something called into [facility] for nausea. Please advise and call [phone]

## 2016-06-20 NOTE — Telephone Encounter (Signed)
PT said she started her new meds Friday, The Flagyl, Biaxin and Nexium. She has been getting sick since yesterday, nausea and spits up, no vomiting. She feels it is one of the new meds. She asked if she has to take it, could she get something to help with nausea. Please advise!

## 2016-06-20 NOTE — Telephone Encounter (Signed)
Patient called back to say that she thinks it may be one of the medicines she was put on Friday that is making her nauseated.

## 2016-06-23 ENCOUNTER — Ambulatory Visit (HOSPITAL_COMMUNITY): Payer: Commercial Managed Care - HMO | Admitting: Specialist

## 2016-06-23 MED ORDER — PROMETHAZINE HCL 12.5 MG PO TABS: ORAL_TABLET | ORAL | 1 refills | Status: DC

## 2016-06-23 NOTE — Telephone Encounter (Addendum)
Called patient TO DISCUSS CONCERNS. NEDS LIKELY CAUSING NAUSEA/DRY HEAVES AND RARE VOMITING(NO BLOOD).C/O DARK URINE AND DIARRHEA x1. PT HAS RESPONDED TO PHENERGAN ONT EH PAST. RX SENT FOR PHENERGAN 12.5 MG 1-2 30 MINS PRIOR TO MEDS AND MEALS. MED SIDE EFFECTS DISCUSSED. PT HAS 5 MORE DAYS OF BIAXIN/FLAGYL. DRINK WATER TO KEEP URINE LIGHT YELLOW. WILL CALL MON JAN 22 WITH AN UPDATE.

## 2016-06-23 NOTE — Addendum Note (Signed)
Addended by: Danie Binder on: 06/23/2016 02:15 PM   Modules accepted: Orders

## 2016-06-26 NOTE — Telephone Encounter (Signed)
PT left Vm this AM for me and I returned her call and [facility] for a return call.  She just said that she was still sick on the VM.

## 2016-06-26 NOTE — Telephone Encounter (Signed)
Per Charlotte Henry, pt called and said she has spoke with Dr. [facility] Alar this AM and Dr. [facility] Alar called in her medicine.

## 2016-06-27 ENCOUNTER — Telehealth: Payer: Self-pay | Admitting: Gastroenterology

## 2016-06-27 NOTE — Telephone Encounter (Signed)
Diagnosis?

## 2016-06-27 NOTE — Telephone Encounter (Signed)
Forwarding to Dr.Fields.  

## 2016-06-27 NOTE — Telephone Encounter (Signed)
Fill out paper work for walker with wheels #1 rfx o

## 2016-06-27 NOTE — Telephone Encounter (Signed)
PATIENT CALLED STATING THAT SHE NEEDS A WALKER AND I TOLD HER TO CONTACT HER PCP

## 2016-06-28 ENCOUNTER — Emergency Department (HOSPITAL_COMMUNITY): Payer: Medicare HMO

## 2016-06-28 ENCOUNTER — Encounter (HOSPITAL_COMMUNITY): Payer: Self-pay | Admitting: Emergency Medicine

## 2016-06-28 ENCOUNTER — Emergency Department (HOSPITAL_COMMUNITY)
Admission: EM | Admit: 2016-06-28 | Discharge: 2016-06-28 | Disposition: A | Discharge status: Home / Self Care | Payer: Medicare HMO | Attending: Emergency Medicine | Admitting: Emergency Medicine

## 2016-06-28 DIAGNOSIS — S3991XA Unspecified injury of abdomen, initial encounter: Secondary | ICD-10-CM | POA: Diagnosis not present

## 2016-06-28 DIAGNOSIS — R112 Nausea with vomiting, unspecified: Secondary | ICD-10-CM | POA: Diagnosis not present

## 2016-06-28 DIAGNOSIS — Z79899 Other long term (current) drug therapy: Secondary | ICD-10-CM | POA: Insufficient documentation

## 2016-06-28 DIAGNOSIS — J449 Chronic obstructive pulmonary disease, unspecified: Secondary | ICD-10-CM | POA: Diagnosis not present

## 2016-06-28 DIAGNOSIS — N183 Chronic kidney disease, stage 3 (moderate): Secondary | ICD-10-CM | POA: Insufficient documentation

## 2016-06-28 DIAGNOSIS — I480 Paroxysmal atrial fibrillation: Secondary | ICD-10-CM | POA: Diagnosis not present

## 2016-06-28 DIAGNOSIS — E1122 Type 2 diabetes mellitus with diabetic chronic kidney disease: Secondary | ICD-10-CM | POA: Diagnosis not present

## 2016-06-28 DIAGNOSIS — R197 Diarrhea, unspecified: Secondary | ICD-10-CM | POA: Diagnosis not present

## 2016-06-28 DIAGNOSIS — F17210 Nicotine dependence, cigarettes, uncomplicated: Secondary | ICD-10-CM | POA: Diagnosis not present

## 2016-06-28 DIAGNOSIS — I129 Hypertensive chronic kidney disease with stage 1 through stage 4 chronic kidney disease, or unspecified chronic kidney disease: Secondary | ICD-10-CM | POA: Diagnosis not present

## 2016-06-28 HISTORY — DX: Irritable bowel syndrome, unspecified: K58.9

## 2016-06-28 LAB — CBC WITH DIFFERENTIAL/PLATELET
BASOS ABS: 0 10*3/uL (ref 0.0–0.1)
Basophils Relative: 0 %
EOS PCT: 4 %
Eosinophils Absolute: 0.3 10*3/uL (ref 0.0–0.7)
HEMATOCRIT: 31.6 % — AB (ref 36.0–46.0)
Hemoglobin: 10.8 g/dL — ABNORMAL LOW (ref 12.0–15.0)
LYMPHS ABS: 1.5 10*3/uL (ref 0.7–4.0)
LYMPHS PCT: 21 %
MCH: 30.3 pg (ref 26.0–34.0)
MCHC: 34.2 g/dL (ref 30.0–36.0)
MCV: 88.8 fL (ref 78.0–100.0)
MONOS PCT: 14 %
Monocytes Absolute: 1 10*3/uL (ref 0.1–1.0)
NEUTROS ABS: 4.4 10*3/uL (ref 1.7–7.7)
Neutrophils Relative %: 61 %
PLATELETS: 251 10*3/uL (ref 150–400)
RBC: 3.56 MIL/uL — ABNORMAL LOW (ref 3.87–5.11)
RDW: 14.1 % (ref 11.5–15.5)
WBC: 7.3 10*3/uL (ref 4.0–10.5)

## 2016-06-28 LAB — URINALYSIS, ROUTINE W REFLEX MICROSCOPIC
BACTERIA UA: NONE SEEN
BILIRUBIN URINE: NEGATIVE
Glucose, UA: NEGATIVE mg/dL
Hgb urine dipstick: NEGATIVE
KETONES UR: NEGATIVE mg/dL
Nitrite: NEGATIVE
Protein, ur: NEGATIVE mg/dL
Specific Gravity, Urine: 1.023 (ref 1.005–1.030)
pH: 5 (ref 5.0–8.0)

## 2016-06-28 LAB — LIPASE, BLOOD: Lipase: 13 U/L (ref 11–51)

## 2016-06-28 LAB — LACTIC ACID, PLASMA
Lactic Acid, Venous: 1.5 mmol/L (ref 0.5–1.9)
Lactic Acid, Venous: 1.2 mmol/L (ref 0.5–1.9)

## 2016-06-28 LAB — COMPREHENSIVE METABOLIC PANEL
ALBUMIN: 3.8 g/dL (ref 3.5–5.0)
ALK PHOS: 36 U/L — AB (ref 38–126)
ALT: 9 U/L — AB (ref 14–54)
AST: 21 U/L (ref 15–41)
Anion gap: 6 (ref 5–15)
BUN: 24 mg/dL — ABNORMAL HIGH (ref 6–20)
CALCIUM: 9.2 mg/dL (ref 8.9–10.3)
CO2: 25 mmol/L (ref 22–32)
CREATININE: 1.17 mg/dL — AB (ref 0.44–1.00)
Chloride: 104 mmol/L (ref 101–111)
GFR calc Af Amer: 51 mL/min — ABNORMAL LOW (ref >60)
GFR, EST NON AFRICAN AMERICAN: 44 mL/min — AB (ref >60)
GLUCOSE: 102 mg/dL — AB (ref 65–99)
POTASSIUM: 4 mmol/L (ref 3.5–5.1)
Sodium: 135 mmol/L (ref 135–145)
Total Bilirubin: 1 mg/dL (ref 0.3–1.2)
Total Protein: 6.9 g/dL (ref 6.5–8.1)

## 2016-06-28 LAB — TROPONIN I: Troponin I: <0.03 ng/mL (ref <0.03)

## 2016-06-28 MED ORDER — ONDANSETRON HCL 4 MG/2ML IJ SOLN
4.0000 mg | INTRAMUSCULAR | Status: DC | PRN
  Filled 2016-06-28: qty 2

## 2016-06-28 MED ORDER — FAMOTIDINE IN NACL 20-0.9 MG/50ML-% IV SOLN
20.0000 mg | Freq: Once | INTRAVENOUS | Status: AC
  Administered 2016-06-28: 20 mg via INTRAVENOUS
  Filled 2016-06-28: qty 50

## 2016-06-28 MED ORDER — SODIUM CHLORIDE 0.9 % IV BOLUS (SEPSIS)
500.0000 mL | Freq: Once | INTRAVENOUS | Status: AC
Start: 2016-06-28 — End: 2016-06-28
  Administered 2016-06-28: 500 mL via INTRAVENOUS

## 2016-06-28 MED ORDER — SODIUM CHLORIDE 0.9 % IV BOLUS (SEPSIS)
500.0000 mL | Freq: Once | INTRAVENOUS | Status: AC
  Administered 2016-06-28: 500 mL via INTRAVENOUS

## 2016-06-28 MED ORDER — SODIUM CHLORIDE 0.9 % IV SOLN
INTRAVENOUS | Status: DC
  Administered 2016-06-28: 14:00:00 via INTRAVENOUS

## 2016-06-28 NOTE — ED Notes (Addendum)
Patient ambulated 5 steps from bed to wheelchair. Patient ambulated with assistance.

## 2016-06-28 NOTE — ED Notes (Signed)
Patient taken to Xray  

## 2016-06-28 NOTE — Telephone Encounter (Signed)
BILATERAL LOWER EXTREMITY WEAKNESS.

## 2016-06-28 NOTE — ED Notes (Signed)
Patient helped to restroom. Urine collection hat placed on toilet patient asked ed staff to leave. Patient states she missed the hat and no urine was collected.

## 2016-06-28 NOTE — ED Notes (Signed)
Pt wheeled to waiting room. Pt verbalized understanding of discharge instructions.   

## 2016-06-28 NOTE — Telephone Encounter (Signed)
Pt said someone is letting her use a walker and she will call if she needs one.

## 2016-06-28 NOTE — Telephone Encounter (Signed)
REVIEWED-NO ADDITIONAL RECOMMENDATIONS. 

## 2016-06-28 NOTE — ED Notes (Signed)
Patient tolerating fluid.  Patient offered food tray.

## 2016-06-28 NOTE — ED Triage Notes (Signed)
Having nausea for over one week.  Dr [facility] Alar called in [place] but it has not helpful.  C/o heaving but no vomiting.

## 2016-06-28 NOTE — ED Provider Notes (Signed)
[facility] DEPT Provider Note   CSN: [id number] Arrival date & time: 06/28/16  1220     History   Chief Complaint Chief Complaint  Patient presents with  . Nausea    HPI Charlotte Henry is a 76 y.o. female.  HPI Pt was seen at 1325.  Per pt's family and pt, c/o gradual onset and persistence of multiple intermittent episodes of N/V/D that began 2 weeks ago.  Describes the stools as "watery."  Has been associated with lightheadedness and one brief episode of syncope that occurred yesterday while she was vomiting. States her symptoms began "after I started taking all those pills for H. Pylori." Pt states her GI Dr. [facility] Alar rx phenergan for her nausea, "but it's not working." Denies abd pain, no CP/SOB, no back pain, no fevers, no black or blood in stools or emesis.     Past Medical History:  Diagnosis Date  . 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 ASCVD (arteriosclerotic cardiovascular disease)    a. cath in 4/04,-80%LAD; 70% RCA, -> DESx2; residual 60% distal RCA; nl EF  b. Nuc 02/2016 aborted due to bradycardia. Will need to be rescheduled   . Carotid artery occlusion   . Cerebrovascular disease   . CHB (complete heart block) ([place])    a. s/p PPM on 03/24/16 with a St. Jude (serial number [id number]) pacemaker  . Chronic kidney disease (CKD), stage III (moderate)   . Clostridium difficile colitis 03/2013   a. 03/2013  . COPD (chronic obstructive pulmonary disease) ([place])   . Coronary disease   . Diabetes mellitus   . Diverticulosis   . GERD (gastroesophageal reflux disease)   . Gout   . Hyperlipidemia   . Hypertension   . PVD (peripheral vascular disease) ([place])    a. moderate to severely decreased ABI's in 8/08, sig. aortic inflow disease b. repeat ABI's in 2011 improved- mild disease on the left and moderate to severe disease on the right  . S/P placement of cardiac pacemaker    a. 03/24/16: St. Jude (serial number [id number]) pacemaker  . TIA (transient ischemic attack)  2012  . Tobacco abuse    a. 50 pack years continuing at 1/2 pack per day    Patient Active Problem List   Diagnosis Date Noted  . Weakness of both upper extremities 06/14/2016  . CHB (complete heart block) ([place])   . COPD (chronic obstructive pulmonary disease) ([place])   . Weight loss, unintentional 12/23/2015  . Anorexia 12/23/2015  . Tobacco use disorder 10/15/2015  . Back pain 12/30/2014  . OA (osteoarthritis) 12/30/2014  . Carotid stenosis 03/11/2014  . PVD (peripheral vascular disease) ([place]) 03/11/2014  . Colon cancer screening 02/04/2014  . Overactive bladder 12/31/2013  . Rash and nonspecific skin eruption 05/19/2013  . Numbness and tingling-bilat foot 03/05/2013  . Weakness of hand- bilat 03/05/2013  . IBS (irritable bowel syndrome) 12/30/2012  . Peripheral neuropathy ([place]) 07/01/2012  . Insomnia 07/01/2012  . Chronic kidney disease (CKD), stage III (moderate) 08/25/2010  . Cardiovascular disease 08/16/2009  . Cerebrovascular disease 08/16/2009  . Peripheral vascular disease ([place]) 08/16/2009  . Diabetes mellitus ([place]) 10/26/2008  . Hyperlipidemia 10/26/2008  . Gout, unspecified 10/26/2008  . Essential hypertension 10/26/2008  . Asthmatic bronchitis 10/26/2008  . Gastroesophageal reflux disease 10/26/2008    Past Surgical History:  Procedure Laterality Date  . ABDOMINAL HYSTERECTOMY    . carpel tunnel release     bilateral  . CHOLECYSTECTOMY    . COLONOSCOPY  2009  .  EP IMPLANTABLE DEVICE N/A 03/24/2016   Procedure: Pacemaker Implant;  Surgeon: [name], MD;  Location: [facility];  Service: Cardiovascular;  Laterality: N/A;  . TOTAL ABDOMINAL HYSTERECTOMY W/ BILATERAL SALPINGOOPHORECTOMY      OB History    Gravida Para Term Preterm AB Living   '2 2 2         '$ SAB TAB Ectopic Multiple Live Births                   Home Medications    Prior to Admission medications   Medication Sig Start Date End Date Taking? Authorizing Provider    acetaminophen (TYLENOL) 500 MG tablet Take 500 mg by mouth every 6 (six) hours as needed for mild pain or moderate pain.    Historical Provider, MD  albuterol (PROVENTIL HFA;VENTOLIN HFA) 108 (90 Base) MCG/ACT inhaler Inhale 2 puffs into the lungs every 4 (four) hours as needed for wheezing or shortness of breath. 07/23/15   Alycia Rossetti, MD  ALPRAZolam Duanne Moron) 0.25 MG tablet Take 1 tablet (0.25 mg total) by mouth at bedtime as needed. 06/14/16   Alycia Rossetti, MD  calcium citrate-vitamin D (CITRACAL+D) 315-200 MG-UNIT per tablet Take 1 tablet by mouth daily.      Historical Provider, MD  clarithromycin (BIAXIN) 500 MG tablet 1 PO BID FOR 14 DAYS 06/16/16   Danie Binder, MD  Cranberry 250 MG CAPS Take 250 mg by mouth daily.    Historical Provider, MD  Cyanocobalamin (VITAMIN B 12 PO) Take 1 tablet by mouth daily.     Historical Provider, MD  EPINEPHrine 0.3 mg/0.3 mL IJ SOAJ injection Inject 0.3 mLs (0.3 mg total) into the muscle once. 10/06/13   Alycia Rossetti, MD  esomeprazole (NEXIUM) 40 MG capsule Take 1 capsule (40 mg total) by mouth 2 (two) times daily before a meal. FOR 14 DAYS 06/16/16   Danie Binder, MD  Fluticasone-Salmeterol (ADVAIR) 100-50 MCG/DOSE AEPB Inhale 1 puff into the lungs 2 (two) times daily. Patient taking differently: Inhale 1 puff into the lungs 2 (two) times daily as needed.  12/15/15   Alycia Rossetti, MD  gabapentin (NEURONTIN) 100 MG capsule Take 1-2 capsules at bedtime for nerve pain 06/14/16   Alycia Rossetti, MD  lidocaine-prilocaine (EMLA) cream Apply 1 application topically 3 times/day as needed-between meals & bedtime.  05/20/14   Historical Provider, MD  lubiprostone (AMITIZA) 8 MCG capsule Take 1 capsule (8 mcg total) by mouth 2 (two) times daily with a meal. 01/10/16   Alycia Rossetti, MD  metroNIDAZOLE (FLAGYL) 500 MG tablet 1 PO TID FOR 14 DAYS 06/16/16   Danie Binder, MD  mirabegron ER (MYRBETRIQ) 25 MG TB24 tablet Take 1 tablet (25 mg total) by mouth  at bedtime. For bladder 07/23/15   Alycia Rossetti, MD  pravastatin (PRAVACHOL) 20 MG tablet Take 1 tablet (20 mg total) by mouth daily. 02/17/16   Alycia Rossetti, MD  Probiotic Product (PROBIOTIC DAILY PO) Take 1 capsule by mouth daily.     Historical Provider, MD  promethazine (PHENERGAN) 12.5 MG tablet 1-2 po 30 MINS PRIOR TO MEDS OR MEALS. MAY REPEAT EVERY 4-6 h prn nausea or vomiting 06/23/16   Danie Binder, MD  rivaroxaban (XARELTO) 10 MG TABS tablet Take 1 tablet (10 mg total) by mouth daily. 06/16/16   Danie Binder, MD  rivaroxaban (XARELTO) 20 MG TABS tablet Take 1 tablet (20 mg total) by  mouth daily with supper. 05/02/16   [name], MD  Tiotropium Bromide Monohydrate (SPIRIVA RESPIMAT) 1.25 MCG/ACT AERS Inhale 2 puffs into the lungs daily. 06/14/16   Alycia Rossetti, MD  valsartan (DIOVAN) 160 MG tablet TAKE 1 TABLET BY MOUTH DAILY. 05/31/16   Alycia Rossetti, MD  ZETIA 10 MG tablet TAKE 1 TABLET BY MOUTH ONCE A DAY. 02/29/16   Herminio Commons, MD    Family History Family History  Problem Relation Age of Onset  . Liver disease Mother 8  . Hypertension Mother   . Cerebral aneurysm Father   . Aneurysm Father 75  . Cancer Sister   . Liver disease Brother   . Liver disease Brother   . Liver disease Brother   . Diabetes type II Brother   . Alcohol abuse Brother   . Cancer Sister     Social History Social History  Substance Use Topics  . Smoking status: Current Every Day Smoker    Packs/day: 0.50    Years: 50.00    Types: Cigarettes    Start date: 02/02/1966  . Smokeless tobacco: Never Used     Comment: 1/2 pack daily  . Alcohol use No     Allergies   Ace inhibitors; Aspirin; Codeine; Pineapple; Plasticized base [plastibase]; Ultram [tramadol]; Adhesive [tape]; Naprosyn [naproxen]; Clindamycin/lincomycin; Linzess [linaclotide]; Nsaids; Penicillins; and Varenicline tartrate   Review of Systems Review of Systems ROS: Statement: All systems negative except  as marked or noted in the HPI; Constitutional: Negative for fever and chills. ; ; Eyes: Negative for eye pain, redness and discharge. ; ; ENMT: Negative for ear pain, hoarseness, nasal congestion, sinus pressure and sore throat. ; ; Cardiovascular: Negative for chest pain, palpitations, diaphoresis, dyspnea and peripheral edema. ; ; Respiratory: Negative for cough, wheezing and stridor. ; ; Gastrointestinal: +N/V/D. Negative for abdominal pain, blood in stool, hematemesis, jaundice and rectal bleeding. . ; ; Genitourinary: Negative for dysuria, flank pain and hematuria. ; ; Musculoskeletal: Negative for back pain and neck pain. Negative for swelling and trauma.; ; Skin: Negative for pruritus, rash, abrasions, blisters, bruising and skin lesion.; ; Neuro: Negative for headache and neck stiffness. Negative for extremity weakness, paresthesias, involuntary movement, seizure and +lightheadedness, +syncope.      Physical Exam Updated Vital Signs BP 106/60 (BP Location: Left Arm)   Pulse 63   Temp 98 F (36.7 C) (Oral)   Resp 22   Ht '5\' 7"'$  (1.702 m)   Wt 148 lb (67.1 kg)   SpO2 98%   BMI 23.18 kg/m    15:44:03 Orthostatic Vital Signs BP  Orthostatic Lying   BP- Lying: 104/68  Pulse- Lying: 76      Orthostatic Sitting  BP- Sitting: 114/67  Pulse- Sitting: 78      Orthostatic Standing at 0 minutes  BP- Standing at 0 minutes: 93/68  Pulse- Standing at 0 minutes: 81    Patient Vitals for the past 24 hrs:  BP Temp Temp src Pulse Resp SpO2 Height Weight  06/28/16 1730 123/71 - - - 20 - - -  06/28/16 1700 120/77 - - 68 14 98 % - -  06/28/16 1630 126/81 - - 73 19 100 % - -  06/28/16 1600 108/66 - - 68 24 99 % - -  06/28/16 1500 103/67 - - 67 19 98 % - -  06/28/16 1410 - - - 63 22 98 % - -  06/28/16 1223 106/60 98 F (36.7 C) Oral 70  16 100 % '5\' 7"'$  (1.702 m) 148 lb (67.1 kg)     Physical Exam 1330: Physical examination:  Nursing notes reviewed; Vital signs and O2 SAT reviewed;   Constitutional: Well developed, Well nourished, Well hydrated, In no acute distress; Head:  Normocephalic, atraumatic; Eyes: EOMI, PERRL, No scleral icterus; ENMT: Mouth and pharynx normal, Mucous membranes moist; Neck: Supple, Full range of motion, No lymphadenopathy; Cardiovascular: Regular rate and rhythm, No gallop; Respiratory: Breath sounds clear & equal bilaterally, No wheezes.  Speaking full sentences with ease, Normal respiratory effort/excursion; Chest: Nontender, Movement normal; Abdomen: Soft, Nontender, Nondistended, Normal bowel sounds; Genitourinary: No CVA tenderness; Extremities: Pulses normal, No tenderness, No edema, No calf edema or asymmetry.; Neuro: AA&Ox3, No facial droop. Major CN grossly intact.  Speech clear. No gross focal motor or sensory deficits in extremities.; Skin: Color normal, Warm, Dry.   ED Treatments / Results  Labs (all labs ordered are listed, but only abnormal results are displayed)   EKG  EKG Interpretation  Date/Time:  Wednesday June 28 2016 13:58:23 EST Ventricular Rate:  66 PR Interval:    QRS Duration: 152 QT Interval:  457 QTC Calculation: 479 R Axis:   -77 Text Interpretation:  A-V dual-paced rhythm with some inhibition No further analysis attempted due to paced rhythm When compared with ECG of 03/25/2016 No significant change was found Confirmed by [facility]  MD, Nunzio Cory [phone]) on 06/28/2016 2:01:54 PM       Radiology   Procedures Procedures (including critical care time)  Medications Ordered in ED Medications  famotidine (PEPCID) IVPB 20 mg premix (20 mg Intravenous New Bag/Given 06/28/16 1406)  ondansetron (ZOFRAN) injection 4 mg (not administered)  0.9 %  sodium chloride infusion ( Intravenous New Bag/Given 06/28/16 1408)  sodium chloride 0.9 % bolus 500 mL (not administered)     Initial Impression / Assessment and Plan / ED Course  I have reviewed the triage vital signs and the nursing notes.  Pertinent labs & imaging  results that were available during my care of the patient were reviewed by me and considered in my medical decision making (see chart for details).  MDM Reviewed: previous chart, nursing note and vitals Reviewed previous: labs and ECG Interpretation: labs, ECG and x-ray   Results for orders placed or performed during the hospital encounter of 06/28/16  Comprehensive metabolic panel  Result Value Ref Range   Sodium 135 135 - 145 mmol/L   Potassium 4.0 3.5 - 5.1 mmol/L   Chloride 104 101 - 111 mmol/L   CO2 25 22 - 32 mmol/L   Glucose, Bld 102 (H) 65 - 99 mg/dL   BUN 24 (H) 6 - 20 mg/dL   Creatinine, Ser 1.17 (H) 0.44 - 1.00 mg/dL   Calcium 9.2 8.9 - 10.3 mg/dL   Total Protein 6.9 6.5 - 8.1 g/dL   Albumin 3.8 3.5 - 5.0 g/dL   AST 21 15 - 41 U/L   ALT 9 (L) 14 - 54 U/L   Alkaline Phosphatase 36 (L) 38 - 126 U/L   Total Bilirubin 1.0 0.3 - 1.2 mg/dL   GFR calc non Af Amer 44 (L) >60 mL/min   GFR calc Af Amer 51 (L) >60 mL/min   Anion gap 6 5 - 15  Lipase, blood  Result Value Ref Range   Lipase 13 11 - 51 U/L  Troponin I  Result Value Ref Range   Troponin I <0.03 <0.03 ng/mL  Lactic acid, plasma  Result Value Ref Range  Lactic Acid, Venous 1.5 0.5 - 1.9 mmol/L  Lactic acid, plasma  Result Value Ref Range   Lactic Acid, Venous 1.2 0.5 - 1.9 mmol/L  CBC with Differential  Result Value Ref Range   WBC 7.3 4.0 - 10.5 K/uL   RBC 3.56 (L) 3.87 - 5.11 MIL/uL   Hemoglobin 10.8 (L) 12.0 - 15.0 g/dL   HCT 31.6 (L) 36.0 - 46.0 %   MCV 88.8 78.0 - 100.0 fL   MCH 30.3 26.0 - 34.0 pg   MCHC 34.2 30.0 - 36.0 g/dL   RDW 14.1 11.5 - 15.5 %   Platelets 251 150 - 400 K/uL   Neutrophils Relative % 61 %   Neutro Abs 4.4 1.7 - 7.7 K/uL   Lymphocytes Relative 21 %   Lymphs Abs 1.5 0.7 - 4.0 K/uL   Monocytes Relative 14 %   Monocytes Absolute 1.0 0.1 - 1.0 K/uL   Eosinophils Relative 4 %   Eosinophils Absolute 0.3 0.0 - 0.7 K/uL   Basophils Relative 0 %   Basophils Absolute 0.0 0.0 -  0.1 K/uL  Urinalysis, Routine w reflex microscopic  Result Value Ref Range   Color, Urine AMBER (A) YELLOW   APPearance CLEAR CLEAR   Specific Gravity, Urine 1.023 1.005 - 1.030   pH 5.0 5.0 - 8.0   Glucose, UA NEGATIVE NEGATIVE mg/dL   Hgb urine dipstick NEGATIVE NEGATIVE   Bilirubin Urine NEGATIVE NEGATIVE   Ketones, ur NEGATIVE NEGATIVE mg/dL   Protein, ur NEGATIVE NEGATIVE mg/dL   Nitrite NEGATIVE NEGATIVE   Leukocytes, UA TRACE (A) NEGATIVE   RBC / HPF 0-5 0 - 5 RBC/hpf   WBC, UA 0-5 0 - 5 WBC/hpf   Bacteria, UA NONE SEEN NONE SEEN   Mucous PRESENT    Hyaline Casts, UA PRESENT    Dg Abd Acute W/chest Result Date: 06/28/2016 CLINICAL DATA:  Nausea.  Fall.  COPD. EXAM: DG ABDOMEN ACUTE W/ 1V CHEST COMPARISON:  03/25/2016. FINDINGS: Cardiac pacer with lead tips in right atrium and right ventricle. Heart size normal. No focal infiltrate. No pleural effusion or pneumothorax. Surgical clips right upper quadrant. Several air-filled loops of small bowel are noted. Colonic gas pattern is normal. No free air. Aortoiliac and peripheral vascular calcification. Visceral including renal vascular calcification Pelvic calcifications consistent phleboliths . Degenerative changes noted throughout the spine and both hips. IMPRESSION: 1. Cardiac pacer with lead tips in right atrium right ventricle. No acute cardiopulmonary disease identified. 2. Several air-filled loops of nondilated small bowel . is nonspecific finding. Colonic gas pattern is normal. Follow-up abdominal series can be obtained to exclude developing small bowel distention. 3. Aortoiliac and peripheral vascular disease. Visceral include renal atherosclerotic vascular disease. Electronically Signed   By: Marcello Moores  Register   On: 06/28/2016 14:55   Results for SANJANA, FOLZ (MRN [id number]) as of 06/28/2016 18:00  Ref. Range 03/20/2016 10:35 06/14/2016 12:21 06/28/2016 13:41 06/28/2016 15:54  BUN Latest Ref Range: 6 - 20 mg/dL '20 17 24 '$ (H)     Creatinine Latest Ref Range: 0.44 - 1.00 mg/dL 1.11 (H) 0.95 (H) 1.17 (H)     Results for ANALISIA, KINGSFORD (MRN [id number]) as of 06/28/2016 18:00  Ref. Range 06/14/2016 12:21 06/28/2016 13:41  Hemoglobin Latest Ref Range: 12.0 - 15.0 g/dL 10.3 (L) 10.8 (L)  HCT Latest Ref Range: 36.0 - 46.0 % 32.8 (L) 31.6 (L)    1655:  [facility] called regarding St. Jude printout:  States pt has had several  episodes of rate controlled afib, none yesterday to account for syncopal episode, otherwise no events; pt already on xarelto, they will make Dr. Lovena Le aware of reading.   1700:  VS improved after IVF bolus. Pt ambulated to bathroom with steady gait, but missed hat to obtain urine sample. Will trial PO and attempt again.   1930:  Pt has ambulated with steady gait. Has tol PO well without N/V. No stooling while in the ED. Abd remains benign, VSS. Pt states she wants to go home now. Already has antiemetics at home. Dx and testing d/w pt and family.  Questions answered.  Verb understanding, agreeable to d/c home with outpt f/u.   Final Clinical Impressions(s) / ED Diagnoses   Final diagnoses:  None    New Prescriptions New Prescriptions   No medications on file     Francine Graven, DO 07/01/16 2349

## 2016-06-28 NOTE — Discharge Instructions (Signed)
Take your prescriptions as previously directed.  Increase your fluid intake (ie:  Gatoraide) for the next few days.  Eat a bland diet and advance to your regular diet slowly as you can tolerate it.   Avoid full strength juices, as well as milk and milk products until your diarrhea has resolved. Your pacemaker showed you have been having intermittent "irregular heart beats."  Call your regular medical doctor, your GI doctor, and your Cardiologist tomorrow to schedule a follow up appointment this week.  Return to the Emergency Department immediately if not improving (or even worsening) despite taking the medicines as prescribed, any black or bloody stool or vomit, if you develop a fever over "101," or for any other concerns.

## 2016-06-29 ENCOUNTER — Telehealth: Payer: Self-pay

## 2016-06-29 ENCOUNTER — Telehealth: Payer: Self-pay | Admitting: *Deleted

## 2016-06-29 NOTE — Telephone Encounter (Signed)
Dr. Thurnell Garbe calling regarding MOD sent from [facility] ED. She needs interpretation of transmission. Ms. Dundon is in the ED for syncope.  Normal device function, >99% Vpacing due to CHB. <1% AT/AF, new AF noted on 06/26/16, 06/28/16. Episodes do not correlate with episodes of syncope per MD.  Xarelto initiated 04/03/16 due to L arm swelling/warmth.  GT aware of new AF. ROV with GT 07/07/16.

## 2016-06-29 NOTE — Telephone Encounter (Signed)
Dr. [facility] Alar see note below.

## 2016-06-29 NOTE — Telephone Encounter (Signed)
Called patient TO DISCUSS CONCERNS. REVIEWED LABS AND XRAY. STOP TAKING ABX.Charlotte Henry HAVING LOOSE STOOLS AS WELL AS WEAKNESS. EXPLAINED FLAGYL CAN CAUSE WEAKNESS. STOP ABX. FLAGYL LISTED AS AN ALLERGY WEAKNESS/NAUSEA.

## 2016-06-29 NOTE — Telephone Encounter (Signed)
Pt's daughter Cheron Every) called office and is very concerned about her mother. Pt went to ER yesterday at [facility]. She was very weak, dizzy,  nauseated and hasn't been able to eat in past few days. Has dry heaves. She blacked out yesterday and passed out Tuesday. She is currently on Biaxin and Flagyl for H. Pylori. Today she remains weak. As long she is lying down she is not dizzy.   Pt's daughter request Dr. [facility] Alar call her. [phone].

## 2016-06-29 NOTE — Telephone Encounter (Signed)
Opened in error

## 2016-06-29 NOTE — Telephone Encounter (Addendum)
CALLED PT'S DAUGHTER. PT IS WEAK & WON'T EAT. BEEN FEELING WEAK SINCE TUES. SHE PASSED OUT. THINKS A LOT IN HER MIND THAT SHE CAN'T EAT. SHE HASN'T WALKED SINCE TUES. SHE IS DRINKING PT HAS OPV MON AT 2 PM. SHE HAS TWO MORE DAYS. TAKING PHENERGAN. STOP TAKING FLAGYL. OK TO USE ENSURE QID.

## 2016-06-30 LAB — URINE CULTURE

## 2016-07-01 ENCOUNTER — Other Ambulatory Visit: Payer: Self-pay | Admitting: Family Medicine

## 2016-07-03 ENCOUNTER — Ambulatory Visit (INDEPENDENT_AMBULATORY_CARE_PROVIDER_SITE_OTHER): Payer: Medicare HMO | Admitting: Gastroenterology

## 2016-07-03 ENCOUNTER — Encounter: Payer: Self-pay | Admitting: Gastroenterology

## 2016-07-03 VITALS — BP 90/64 | HR 80 | Temp 98.0°F | Ht 67.0 in | Wt 146.2 lb

## 2016-07-03 DIAGNOSIS — D649 Anemia, unspecified: Secondary | ICD-10-CM | POA: Diagnosis not present

## 2016-07-03 DIAGNOSIS — R634 Abnormal weight loss: Secondary | ICD-10-CM | POA: Diagnosis not present

## 2016-07-03 NOTE — Patient Instructions (Addendum)
Your first blood pressure today was 94/62. We rechecked it and the top number was 90. This may explain why you are so weak. I feel you need to go back to the emergency room so they can evaluate you, draw labs, and see what is going on. You could be dehydrated or your kidneys could not be doing well if you are not staying hydrated.   If you do not choose to go to the emergency room, the only other option would be checking blood work as an outpatient. If there are any other abnormalities seen there, we would still recommend going to the emergency room. We are advising to seek care at the emergency room, because they will be able to quickly triage you and see what is going on.

## 2016-07-03 NOTE — Assessment & Plan Note (Signed)
Had been stable from October to January, now down 2 lbs to 146 but likely secondary to acute N/V that has now resolved since cessation of antibiotics. Attempt at treatment for H.pylori with intolerance to abx; she completed about 13 of 14 days. Review of labs in ED last week with bump in Creatinine. Hypotensive today with original BP 94/62, repeat 90/64. Feels persistent fatigue, worsening. No dizziness, chest pain, SOB. No abdominal pain.  I'm concerned regarding her decline but moreso concerned about current presentation and advised presentation to ED, especially in light of hypotension and concern for possible dehydration. She is declining this. Son has brought her today and will take her for labs, as she is agreeable to this. Will check iron studies, TSH, repeat BMP. May check urea breath test at some point in the future to see if H.pylori eradicated. If persistent weight loss, may want to pursue CTA although does not seem to have classic symptoms of mesenteric ischemia and CT reviewed from July 2017. Weight loss likely multifactorial. Further recommendations after labs.

## 2016-07-03 NOTE — Assessment & Plan Note (Signed)
Likely chronic disease. Check iron studies.

## 2016-07-03 NOTE — Progress Notes (Signed)
Referring Provider: Alycia Rossetti, MD Primary Care Physician:  [name], MD  Primary GI: Dr. Oneida Alar    Chief Complaint  Patient presents with  . Nausea    better since stopped antibiotics. Went to ER 06/28/16  . Anorexia    loss of appetite, weakness    HPI:   Charlotte Henry is a 76 y.o. female presenting today with a history of anorexia, weight loss, GERD, IBS-C. Recent urea breath test positive and prescribed biaxin and flagyl. She developed nausea, weakness, dry heaves. Unable to complete full dose. Went to ED.   BP 106/60 in the ED 06/28/16. Only had a can of ensure today. No appetite. Had heaves yesterday but N/V better. Cream potato and steak yesterday. Drinking a lot of gatorade. No abdominal pain. Walking with a cane and walker. Over the last 2 weeks has felt weaker. Took 13 days worth of the antibiotics. Denies any shortness of breath. Just feels tired. Original BP today 94/62. Recheck was 90/64.   Last dose of antibiotics on Thursday. She is unsure if she is taking Remeron or not.   Weights: Aug 2017 155      Oct 2017 148      Jun 14, 2016 148                 Today 146   Past Medical History:  Diagnosis Date  . 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 ASCVD (arteriosclerotic cardiovascular disease)    a. cath in 4/04,-80%LAD; 70% RCA, -> DESx2; residual 60% distal RCA; nl EF  b. Nuc 02/2016 aborted due to bradycardia. Will need to be rescheduled   . Carotid artery occlusion   . Cerebrovascular disease   . CHB (complete heart block) ([place])    a. s/p PPM on 03/24/16 with a St. Jude (serial number [id number]) pacemaker  . Chronic kidney disease (CKD), stage III (moderate)   . Clostridium difficile colitis 03/2013   a. 03/2013  . COPD (chronic obstructive pulmonary disease) ([place])   . Coronary disease   . Diabetes mellitus   . Diverticulosis   . GERD (gastroesophageal reflux disease)   . Gout   . Hyperlipidemia   . Hypertension   . IBS (irritable bowel  syndrome)   . PVD (peripheral vascular disease) ([place])    a. moderate to severely decreased ABI's in 8/08, sig. aortic inflow disease b. repeat ABI's in 2011 improved- mild disease on the left and moderate to severe disease on the right  . S/P placement of cardiac pacemaker    a. 03/24/16: St. Jude (serial number [id number]) pacemaker  . TIA (transient ischemic attack) 2012  . Tobacco abuse    a. 50 pack years continuing at 1/2 pack per day    Past Surgical History:  Procedure Laterality Date  . ABDOMINAL HYSTERECTOMY    . carpel tunnel release     bilateral  . CHOLECYSTECTOMY    . COLONOSCOPY  2009  . EP IMPLANTABLE DEVICE N/A 03/24/2016   Procedure: Pacemaker Implant;  Surgeon: [name], MD;  Location: [facility];  Service: Cardiovascular;  Laterality: N/A;  . TOTAL ABDOMINAL HYSTERECTOMY W/ BILATERAL SALPINGOOPHORECTOMY      Current Outpatient Prescriptions  Medication Sig Dispense Refill  . acetaminophen (TYLENOL) 500 MG tablet Take 500 mg by mouth every 6 (six) hours as needed for mild pain or moderate pain.    Marland Kitchen albuterol (PROVENTIL HFA;VENTOLIN HFA) 108 (90 Base) MCG/ACT inhaler Inhale 2 puffs into  the lungs every 4 (four) hours as needed for wheezing or shortness of breath. 1 Inhaler 2  . ALPRAZolam (XANAX) 0.25 MG tablet Take 1 tablet (0.25 mg total) by mouth at bedtime as needed. 30 tablet 1  . calcium citrate-vitamin D (CITRACAL+D) 315-200 MG-UNIT per tablet Take 1 tablet by mouth daily.      . Cranberry 250 MG CAPS Take 250 mg by mouth daily.    . Cyanocobalamin (VITAMIN B 12 PO) Take 1 tablet by mouth daily.     Marland Kitchen esomeprazole (NEXIUM) 40 MG capsule Take 1 capsule (40 mg total) by mouth 2 (two) times daily before a meal. FOR 14 DAYS 28 capsule 0  . Fluticasone-Salmeterol (ADVAIR) 100-50 MCG/DOSE AEPB Inhale 1 puff into the lungs 2 (two) times daily. (Patient taking differently: Inhale 1 puff into the lungs 2 (two) times daily as needed. ) 1 each 3  . gabapentin  (NEURONTIN) 100 MG capsule Take 1-2 capsules at bedtime for nerve pain (Patient taking differently: Take 100 mg by mouth at bedtime. Take 1-2 capsules at bedtime for nerve pain) 60 capsule 2  . mirabegron ER (MYRBETRIQ) 25 MG TB24 tablet Take 1 tablet (25 mg total) by mouth at bedtime. For bladder (Patient taking differently: Take 25 mg by mouth as needed. For bladder) 30 tablet 6  . pravastatin (PRAVACHOL) 20 MG tablet TAKE ONE TABLET BY MOUTH ONCE DAILY. 30 tablet 0  . Probiotic Product (PROBIOTIC DAILY PO) Take 1 capsule by mouth daily.     . promethazine (PHENERGAN) 12.5 MG tablet 1-2 po 30 MINS PRIOR TO MEDS OR MEALS. MAY REPEAT EVERY 4-6 h prn nausea or vomiting 40 tablet 1  . rivaroxaban (XARELTO) 20 MG TABS tablet Take 1 tablet (20 mg total) by mouth daily with supper. 30 tablet 11  . Tiotropium Bromide Monohydrate (SPIRIVA RESPIMAT) 1.25 MCG/ACT AERS Inhale 2 puffs into the lungs daily. 1 Inhaler 11  . valsartan (DIOVAN) 160 MG tablet TAKE 1 TABLET BY MOUTH DAILY. 90 tablet 0  . ZETIA 10 MG tablet TAKE 1 TABLET BY MOUTH ONCE A DAY. 30 tablet 6   No current facility-administered medications for this visit.     Allergies as of 07/03/2016 - Review Complete 07/03/2016  Allergen Reaction Noted  . Ace inhibitors Hives, Shortness Of Breath, and Swelling 10/27/2008  . Aspirin Other (See Comments) 10/27/2008  . Codeine Swelling 02/02/2011  . Pineapple Shortness Of Breath and Swelling 09/03/2013  . Plasticized base [plastibase] Hives and Swelling 02/07/2012  . Ultram [tramadol] Swelling 03/21/2012  . Adhesive [tape] Swelling and Rash 02/07/2012  . Naprosyn [naproxen] Swelling 02/02/2011  . Clindamycin/lincomycin Other (See Comments) 05/04/2014  . Linzess [linaclotide]  02/26/2013  . Metronidazole  06/29/2016  . Nsaids  03/04/2012  . Penicillins  05/04/2014  . Varenicline tartrate Swelling 10/27/2008    Family History  Problem Relation Age of Onset  . Liver disease Mother 82  .  Hypertension Mother   . Cerebral aneurysm Father   . Aneurysm Father 34  . Cancer Sister   . Liver disease Brother   . Liver disease Brother   . Liver disease Brother   . Diabetes type II Brother   . Alcohol abuse Brother   . Cancer Sister     Social History   Social History  . Marital status: Widowed    Spouse name: N/A  . Number of children: N/A  . Years of education: N/A   Social History Main Topics  . Smoking status: Current  Every Day Smoker    Packs/day: 0.50    Years: 50.00    Types: Cigarettes    Start date: 02/02/1966  . Smokeless tobacco: Never Used     Comment: 1/2 pack daily  . Alcohol use No  . Drug use: No  . Sexual activity: Not Currently   Other Topics Concern  . None   Social History Narrative  . None    Review of Systems: As mentioned in HPI   Physical Exam: BP 90/64 (BP Location: Right Arm, Cuff Size: Normal)   Pulse 80   Temp 98 F (36.7 C) (Oral)   Ht '5\' 7"'$  (1.702 m)   Wt 146 lb 3.2 oz (66.3 kg)   BMI 22.90 kg/m  General:   Alert and oriented. No distress noted. Pleasant and cooperative.  Head:  Normocephalic and atraumatic. Abdomen:  +BS, soft, non-tender and non-distended. No rebound or guarding. No HSM or masses noted. Msk:  Kyphosis. Using cane for assistance.  Extremities:  Without edema. Neurologic:  Alert and  oriented x4 Psych:  Alert and cooperative. Normal mood and affect.  Lab Results  Component Value Date   WBC 7.3 06/28/2016   HGB 10.8 (L) 06/28/2016   HCT 31.6 (L) 06/28/2016   MCV 88.8 06/28/2016   PLT 251 06/28/2016   Lab Results  Component Value Date   CREATININE 1.17 (H) 06/28/2016   BUN 24 (H) 06/28/2016   NA 135 06/28/2016   K 4.0 06/28/2016   CL 104 06/28/2016   CO2 25 06/28/2016   CT abd/pelvis July 2017: atherosclerotic plaque at origin of celiac and SMA but both vessels opacified.

## 2016-07-04 ENCOUNTER — Telehealth: Payer: Self-pay | Admitting: *Deleted

## 2016-07-04 ENCOUNTER — Encounter (HOSPITAL_COMMUNITY): Payer: Self-pay

## 2016-07-04 ENCOUNTER — Telehealth: Payer: Self-pay | Admitting: Gastroenterology

## 2016-07-04 ENCOUNTER — Emergency Department (HOSPITAL_COMMUNITY)
Admission: EM | Admit: 2016-07-04 | Discharge: 2016-07-04 | Disposition: A | Discharge status: Home / Self Care | Payer: Medicare HMO | Attending: Emergency Medicine | Admitting: Emergency Medicine

## 2016-07-04 DIAGNOSIS — E860 Dehydration: Secondary | ICD-10-CM | POA: Diagnosis not present

## 2016-07-04 DIAGNOSIS — J449 Chronic obstructive pulmonary disease, unspecified: Secondary | ICD-10-CM | POA: Diagnosis not present

## 2016-07-04 DIAGNOSIS — N183 Chronic kidney disease, stage 3 (moderate): Secondary | ICD-10-CM | POA: Diagnosis not present

## 2016-07-04 DIAGNOSIS — N179 Acute kidney failure, unspecified: Secondary | ICD-10-CM

## 2016-07-04 DIAGNOSIS — F17210 Nicotine dependence, cigarettes, uncomplicated: Secondary | ICD-10-CM | POA: Insufficient documentation

## 2016-07-04 DIAGNOSIS — Z79899 Other long term (current) drug therapy: Secondary | ICD-10-CM | POA: Diagnosis not present

## 2016-07-04 DIAGNOSIS — R799 Abnormal finding of blood chemistry, unspecified: Secondary | ICD-10-CM | POA: Diagnosis present

## 2016-07-04 DIAGNOSIS — E1122 Type 2 diabetes mellitus with diabetic chronic kidney disease: Secondary | ICD-10-CM | POA: Insufficient documentation

## 2016-07-04 DIAGNOSIS — I129 Hypertensive chronic kidney disease with stage 1 through stage 4 chronic kidney disease, or unspecified chronic kidney disease: Secondary | ICD-10-CM | POA: Diagnosis not present

## 2016-07-04 HISTORY — DX: Peptic ulcer, site unspecified, unspecified as acute or chronic, without hemorrhage or perforation: K27.9

## 2016-07-04 HISTORY — DX: Helicobacter pylori (H. pylori) as the cause of diseases classified elsewhere: B96.81

## 2016-07-04 LAB — COMPREHENSIVE METABOLIC PANEL WITH GFR
ALT: 20 U/L (ref 14–54)
AST: 33 U/L (ref 15–41)
Albumin: 3.7 g/dL (ref 3.5–5.0)
Alkaline Phosphatase: 38 U/L (ref 38–126)
Anion gap: 7 (ref 5–15)
BUN: 40 mg/dL — ABNORMAL HIGH (ref 6–20)
CO2: 24 mmol/L (ref 22–32)
Calcium: 9.4 mg/dL (ref 8.9–10.3)
Chloride: 106 mmol/L (ref 101–111)
Creatinine, Ser: 1.4 mg/dL — ABNORMAL HIGH (ref 0.44–1.00)
GFR calc Af Amer: 41 mL/min — ABNORMAL LOW
GFR calc non Af Amer: 35 mL/min — ABNORMAL LOW
Glucose, Bld: 111 mg/dL — ABNORMAL HIGH (ref 65–99)
Potassium: 4.6 mmol/L (ref 3.5–5.1)
Sodium: 137 mmol/L (ref 135–145)
Total Bilirubin: 0.5 mg/dL (ref 0.3–1.2)
Total Protein: 6.8 g/dL (ref 6.5–8.1)

## 2016-07-04 LAB — BASIC METABOLIC PANEL
BUN: 39 mg/dL — ABNORMAL HIGH (ref 7–25)
CHLORIDE: 108 mmol/L (ref 98–110)
CO2: 24 mmol/L (ref 20–31)
Calcium: 9.2 mg/dL (ref 8.6–10.4)
Creat: 1.44 mg/dL — ABNORMAL HIGH (ref 0.60–0.93)
Glucose, Bld: 156 mg/dL — ABNORMAL HIGH (ref 65–99)
POTASSIUM: 4.5 mmol/L (ref 3.5–5.3)
SODIUM: 138 mmol/L (ref 135–146)

## 2016-07-04 LAB — FERRITIN: Ferritin: 483 ng/mL — ABNORMAL HIGH (ref 20–288)

## 2016-07-04 LAB — IRON AND TIBC
%SAT: 11 % (ref 11–50)
Iron: 21 ug/dL — ABNORMAL LOW (ref 45–160)
TIBC: 183 ug/dL — ABNORMAL LOW (ref 250–450)
UIBC: 162 ug/dL (ref 125–400)

## 2016-07-04 LAB — TSH: TSH: 1.19 mIU/L

## 2016-07-04 LAB — I-STAT CG4 LACTIC ACID, ED: LACTIC ACID, VENOUS: 0.99 mmol/L (ref 0.5–1.9)

## 2016-07-04 MED ORDER — SODIUM CHLORIDE 0.9 % IV SOLN
1000.0000 mL | Freq: Once | INTRAVENOUS | Status: AC
  Administered 2016-07-04: 1000 mL via INTRAVENOUS

## 2016-07-04 NOTE — ED Notes (Signed)
Pt made aware to return if symptoms worsen or if any life threatening symptoms occur.   

## 2016-07-04 NOTE — Discharge Instructions (Signed)
Aggressively hydrated yourself, repeat blood tests in one week.  If you were given medicines take as directed.  If you are on coumadin or contraceptives realize their levels and effectiveness is altered by many different medicines.  If you have any reaction (rash, tongues swelling, other) to the medicines stop taking and see a physician.    If your blood pressure was elevated in the ER make sure you follow up for management with a primary doctor or return for chest pain, shortness of breath or stroke symptoms.  Please follow up as directed and return to the ER or see a physician for new or worsening symptoms.  Thank you. Vitals:   07/04/16 1001 07/04/16 1009  BP:  91/67  Pulse:  103  Resp:  16  Temp:  98.2 F (36.8 C)  TempSrc:  Oral  SpO2:  92%  Weight: 146 lb (66.2 kg)   Height: '5\' 7"'$  (1.702 m)

## 2016-07-04 NOTE — ED Notes (Signed)
MD at bedside. 

## 2016-07-04 NOTE — Telephone Encounter (Signed)
Received call from patient daughter, Anderson Malta.   Requested to discuss illness with PCP.

## 2016-07-04 NOTE — Telephone Encounter (Signed)
Pt called and said that she is in the ER and what does she do now. I told her that I would let the provider know.

## 2016-07-04 NOTE — Progress Notes (Signed)
PT called and is aware. I told her that I did not tell her daughter because she had asked [place] not to. I told her that her daughter needs to know and she said that she will call and tell her daughter. She said she will go but it will be a little later in the morning.

## 2016-07-04 NOTE — Progress Notes (Signed)
cc'ed to pcp °

## 2016-07-04 NOTE — Progress Notes (Signed)
Called pt. LMOM for a return call, but also told her it was very important that she was going to need to go to the ED. I called Anderson Malta, her daughter, ( that is who she told us to call).  She is going to get in touch with her mom and have her call us. ( Pt specifically asked [place] yesterday to call her daughter if we could not get in touch with her BUT NOT TO [place]).

## 2016-07-04 NOTE — ED Triage Notes (Addendum)
Pt was seen by Dr Oneida Alar yesterday and was called to come to ED due to abnormal labs. Denies any problems . Pt reports being treated for Hpylori starting on 06/02/16

## 2016-07-04 NOTE — Telephone Encounter (Signed)
I spoke to pt's daughter who said her mom had called and said she was ready to go home and they were not going to give her any fluids.  So I called Angelina Pih, triage nurse and spoke to her. She said that pt was being discharged and I told her that Roseanne Kaufman, NP was very concerned about the pt's kidney functions and said she felt the pt should have some fluids.  She was with a pt , but told me that she was going to check with the ED physician, Dr. Roderic Palau and she would give me a call back.  She called and said Dr. Roderic Palau said the pt was refusing IV fluids and she asked me if I would speak to the pt and encourage her to get the fluids. I spoke to the pt and she has agreed to get one bag of fluids.  Shirlean Mylar was aware.

## 2016-07-04 NOTE — ED Provider Notes (Addendum)
[facility] DEPT Provider Note   CSN: [id number] Arrival date & time: 07/04/16  2841   By signing my name below, I, Collene Leyden, attest that this documentation has been prepared under the direction and in the presence of Elnora Morrison, MD. Electronically Signed: Collene Leyden, Scribe. 07/04/16. 11:25 AM.  History   Chief Complaint Chief Complaint  Patient presents with  . Abnormal Lab    HPI Comments: Charlotte TAGLIAFERRI is a 76 y.o. female who presents to the Emergency Department complaining of abnormal labs that were taken yesterday. Patient states she was seeing a gastroenterologist PA yesterday, in which her kidney function had worsened. Patient was referred to the ED, due to her abnormal labs. Patient states she feels fine and nothing is wrong. Patient states she was unable to tolerate the medications she was prescribed one week ago, she was allergic to the antibiotics. Patient was advised to quit taking the medication. Patient states she drinks a lot of Gatorade. Eating and drinking well. Patient refuses receiving IV fluids in the hospital, states she can do drink fluids at home. Patient denies dysuria, or any other symptoms.   The history is provided by the patient. No language interpreter was used.    Past Medical History:  Diagnosis Date  . 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 ASCVD (arteriosclerotic cardiovascular disease)    a. cath in 4/04,-80%LAD; 70% RCA, -> DESx2; residual 60% distal RCA; nl EF  b. Nuc 02/2016 aborted due to bradycardia. Will need to be rescheduled   . Carotid artery occlusion   . Cerebrovascular disease   . CHB (complete heart block) ([place])    a. s/p PPM on 03/24/16 with a St. Jude (serial number [id number]) pacemaker  . Chronic kidney disease (CKD), stage III (moderate)   . Clostridium difficile colitis 03/2013   a. 03/2013  . COPD (chronic obstructive pulmonary disease) ([place])   . Coronary disease   . Diabetes mellitus   . Diverticulosis   .  GERD (gastroesophageal reflux disease)   . Gout   . H pylori ulcer   . Hyperlipidemia   . Hypertension   . IBS (irritable bowel syndrome)   . PVD (peripheral vascular disease) ([place])    a. moderate to severely decreased ABI's in 8/08, sig. aortic inflow disease b. repeat ABI's in 2011 improved- mild disease on the left and moderate to severe disease on the right  . S/P placement of cardiac pacemaker    a. 03/24/16: St. Jude (serial number [id number]) pacemaker  . TIA (transient ischemic attack) 2012  . Tobacco abuse    a. 50 pack years continuing at 1/2 pack per day    Patient Active Problem List   Diagnosis Date Noted  . Loss of weight 07/03/2016  . Anemia 07/03/2016  . Weakness of both upper extremities 06/14/2016  . CHB (complete heart block) ([place])   . COPD (chronic obstructive pulmonary disease) ([place])   . Weight loss, unintentional 12/23/2015  . Anorexia 12/23/2015  . Tobacco use disorder 10/15/2015  . Back pain 12/30/2014  . OA (osteoarthritis) 12/30/2014  . Carotid stenosis 03/11/2014  . PVD (peripheral vascular disease) ([place]) 03/11/2014  . Colon cancer screening 02/04/2014  . Overactive bladder 12/31/2013  . Rash and nonspecific skin eruption 05/19/2013  . Numbness and tingling-bilat foot 03/05/2013  . Weakness of hand- bilat 03/05/2013  . IBS (irritable bowel syndrome) 12/30/2012  . Peripheral neuropathy ([place]) 07/01/2012  . Insomnia 07/01/2012  . Chronic kidney disease (CKD), stage III (moderate) 08/25/2010  .  Cardiovascular disease 08/16/2009  . Cerebrovascular disease 08/16/2009  . Peripheral vascular disease ([place]) 08/16/2009  . Diabetes mellitus ([place]) 10/26/2008  . Hyperlipidemia 10/26/2008  . Gout, unspecified 10/26/2008  . Essential hypertension 10/26/2008  . Asthmatic bronchitis 10/26/2008  . Gastroesophageal reflux disease 10/26/2008    Past Surgical History:  Procedure Laterality Date  . ABDOMINAL HYSTERECTOMY    . carpel tunnel release     bilateral    . CHOLECYSTECTOMY    . COLONOSCOPY  2009  . EP IMPLANTABLE DEVICE N/A 03/24/2016   Procedure: Pacemaker Implant;  Surgeon: [name], MD;  Location: [facility];  Service: Cardiovascular;  Laterality: N/A;  . TOTAL ABDOMINAL HYSTERECTOMY W/ BILATERAL SALPINGOOPHORECTOMY      OB History    Gravida Para Term Preterm AB Living   '2 2 2         '$ SAB TAB Ectopic Multiple Live Births                   Home Medications    Prior to Admission medications   Medication Sig Start Date End Date Taking? Authorizing Provider  acetaminophen (TYLENOL) 500 MG tablet Take 500 mg by mouth every 6 (six) hours as needed for mild pain or moderate pain.   Yes Historical Provider, MD  albuterol (PROVENTIL HFA;VENTOLIN HFA) 108 (90 Base) MCG/ACT inhaler Inhale 2 puffs into the lungs every 4 (four) hours as needed for wheezing or shortness of breath. 07/23/15  Yes Alycia Rossetti, MD  ALPRAZolam Duanne Moron) 0.25 MG tablet Take 1 tablet (0.25 mg total) by mouth at bedtime as needed. 06/14/16  Yes Alycia Rossetti, MD  calcium citrate-vitamin D (CITRACAL+D) 315-200 MG-UNIT per tablet Take 1 tablet by mouth daily.     Yes Historical Provider, MD  Cranberry 250 MG CAPS Take 250 mg by mouth daily.   Yes Historical Provider, MD  Cyanocobalamin (VITAMIN B 12 PO) Take 1 tablet by mouth daily.    Yes Historical Provider, MD  esomeprazole (NEXIUM) 40 MG capsule Take 1 capsule (40 mg total) by mouth 2 (two) times daily before a meal. FOR 14 DAYS 06/16/16  Yes Danie Binder, MD  Fluticasone-Salmeterol (ADVAIR) 100-50 MCG/DOSE AEPB Inhale 1 puff into the lungs 2 (two) times daily. Patient taking differently: Inhale 1 puff into the lungs 2 (two) times daily as needed (Shortness of breath).  12/15/15  Yes Alycia Rossetti, MD  gabapentin (NEURONTIN) 100 MG capsule Take 1-2 capsules at bedtime for nerve pain Patient taking differently: Take 100 mg by mouth at bedtime. Take 1-2 capsules at bedtime for nerve pain 06/14/16   Yes Alycia Rossetti, MD  mirabegron ER (MYRBETRIQ) 25 MG TB24 tablet Take 1 tablet (25 mg total) by mouth at bedtime. For bladder Patient taking differently: Take 25 mg by mouth as needed (uinary frequency). For bladder 07/23/15  Yes Alycia Rossetti, MD  pravastatin (PRAVACHOL) 20 MG tablet TAKE ONE TABLET BY MOUTH ONCE DAILY. 07/03/16  Yes Alycia Rossetti, MD  Probiotic Product (PROBIOTIC DAILY PO) Take 1 capsule by mouth daily.    Yes Historical Provider, MD  promethazine (PHENERGAN) 12.5 MG tablet 1-2 po 30 MINS PRIOR TO MEDS OR MEALS. MAY REPEAT EVERY 4-6 h prn nausea or vomiting 06/23/16  Yes Danie Binder, MD  rivaroxaban (XARELTO) 20 MG TABS tablet Take 1 tablet (20 mg total) by mouth daily with supper. 05/02/16  Yes [name], MD  Tiotropium Bromide Monohydrate (SPIRIVA RESPIMAT) 1.25 MCG/ACT  AERS Inhale 2 puffs into the lungs daily. 06/14/16  Yes Alycia Rossetti, MD  valsartan (DIOVAN) 160 MG tablet TAKE 1 TABLET BY MOUTH DAILY. 05/31/16  Yes Alycia Rossetti, MD  ZETIA 10 MG tablet TAKE 1 TABLET BY MOUTH ONCE A DAY. 02/29/16  Yes Herminio Commons, MD    Family History Family History  Problem Relation Age of Onset  . Liver disease Mother 67  . Hypertension Mother   . Cerebral aneurysm Father   . Aneurysm Father 24  . Cancer Sister   . Liver disease Brother   . Liver disease Brother   . Liver disease Brother   . Diabetes type II Brother   . Alcohol abuse Brother   . Cancer Sister     Social History Social History  Substance Use Topics  . Smoking status: Current Every Day Smoker    Packs/day: 0.50    Years: 50.00    Types: Cigarettes    Start date: 02/02/1966  . Smokeless tobacco: Never Used     Comment: 1/2 pack daily  . Alcohol use No     Allergies   Ace inhibitors; Aspirin; Codeine; Pineapple; Plasticized base [plastibase]; Ultram [tramadol]; Adhesive [tape]; Naprosyn [naproxen]; Biaxin [clarithromycin]; Clindamycin/lincomycin; Linzess [linaclotide];  Metronidazole; Nsaids; Penicillins; and Varenicline tartrate   Review of Systems Review of Systems  Constitutional: Negative for activity change, appetite change and fever.  Genitourinary: Negative for difficulty urinating and dysuria.  All other systems reviewed and are negative.    Physical Exam Updated Vital Signs BP 107/70   Pulse 66   Temp 98.3 F (36.8 C) (Oral)   Resp 16   Ht '5\' 7"'$  (1.702 m)   Wt 146 lb (66.2 kg)   SpO2 99%   BMI 22.87 kg/m   Physical Exam  Constitutional: She is oriented to person, place, and time. She appears well-developed.  HENT:  Head: Normocephalic and atraumatic.  Mouth/Throat: Oropharynx is clear and moist.  Dry mucous membranes.   Eyes: Conjunctivae and EOM are normal. Pupils are equal, round, and reactive to light.  Neck: Normal range of motion. Neck supple.  Cardiovascular: Normal rate, regular rhythm and normal heart sounds.   No murmur heard. Pulmonary/Chest: Effort normal and breath sounds normal. No respiratory distress. She has no wheezes. She has no rales.  Lungs are clear and in no distress.   Abdominal: Soft. Bowel sounds are normal. There is no tenderness.  No flank tenderness.   Musculoskeletal: Normal range of motion.  Neurological: She is alert and oriented to person, place, and time.  Skin: Skin is warm and dry.  Psychiatric: She has a normal mood and affect.     ED Treatments / Results  DIAGNOSTIC STUDIES: Oxygen Saturation is 92% on RA, low by my interpretation.    COORDINATION OF CARE: 11:24 AM Discussed treatment plan with pt at bedside and pt agreed to plan, which includes IV fluids.   Labs (all labs ordered are listed, but only abnormal results are displayed) Labs Reviewed  COMPREHENSIVE METABOLIC PANEL - Abnormal; Notable for the following:       Result Value   Glucose, Bld 111 (*)    BUN 40 (*)    Creatinine, Ser 1.40 (*)    GFR calc non Af Amer 35 (*)    GFR calc Af Amer 41 (*)    All other  components within normal limits  I-STAT CG4 LACTIC ACID, ED    EKG  EKG Interpretation None  Radiology No results found.  Procedures Procedures (including critical care time)  Medications Ordered in ED Medications  0.9 %  sodium chloride infusion (0 mLs Intravenous Stopped 07/04/16 1308)     Initial Impression / Assessment and Plan / ED Course  I have reviewed the triage vital signs and the nursing notes.  Pertinent labs & imaging results that were available during my care of the patient were reviewed by me and considered in my medical decision making (see chart for details).   patient presents with clinically dehydration from recent vomiting/side effects from medication. Patient has mild induration on exam, mild worsening kidney function. Blood pressures lower than her normal. Recommend IV fluids patient refused. Physician assistant/clinic called to reinforce goal of IV fluids. Patient changed her mind and agreed to 1 L of IV fluids and follow-up for repeat blood work in the next week. Patient's tolerating oral.  Results and differential diagnosis were discussed with the patient/parent/guardian. Xrays were independently reviewed by myself.  Close follow up outpatient was discussed, comfortable with the plan.   Medications  0.9 %  sodium chloride infusion (0 mLs Intravenous Stopped 07/04/16 1308)    Vitals:   07/04/16 1001 07/04/16 1009 07/04/16 1330  BP:  91/67 107/70  Pulse:  103 66  Resp:  16 16  Temp:  98.2 F (36.8 C) 98.3 F (36.8 C)  TempSrc:  Oral Oral  SpO2:  92% 99%  Weight: 146 lb (66.2 kg)    Height: '5\' 7"'$  (1.702 m)      Final diagnoses:  Acute renal failure, unspecified acute renal failure type (HCC)  Dehydration     Final Clinical Impressions(s) / ED Diagnoses   Final diagnoses:  Acute renal failure, unspecified acute renal failure type (HCC)  Dehydration    New Prescriptions New Prescriptions   No medications on file         Elnora Morrison, MD 07/04/16 1235    Elnora Morrison, MD 07/04/16 1343

## 2016-07-04 NOTE — Progress Notes (Signed)
Doris: please call patient. Her creatinine is worsening from baseline, which was what I was afraid of. She is dehydrated and needs fluids and close monitoring of renal function at lest for an observation status . Please tell her she needs to go to the ED or she risks worsening kidney problems.

## 2016-07-04 NOTE — Telephone Encounter (Signed)
I spoke to pt daughter, as her about her dehydration and her kidney function and her being in the emergency room. We'll follow-up with them on Friday she is getting the care that she needs she was reassured

## 2016-07-05 NOTE — Progress Notes (Signed)
LMOM to call.

## 2016-07-05 NOTE — Progress Notes (Signed)
I am glad patient received care. I see she will be following up with PCP 2/2. She needs follow-up with Dr. Oneida Alar only in next 2-4 weeks.

## 2016-07-05 NOTE — Progress Notes (Signed)
Pt is aware , said she feels a little better and she will keep appt with PCP on 07/07/2016. She is aware that we will schedule an appt with Dr. Oneida Alar in 2-4 weeks.

## 2016-07-06 ENCOUNTER — Encounter: Payer: Self-pay | Admitting: Internal Medicine

## 2016-07-06 ENCOUNTER — Ambulatory Visit (INDEPENDENT_AMBULATORY_CARE_PROVIDER_SITE_OTHER): Payer: Medicare HMO | Admitting: Internal Medicine

## 2016-07-06 DIAGNOSIS — I442 Atrioventricular block, complete: Secondary | ICD-10-CM | POA: Diagnosis not present

## 2016-07-06 LAB — CUP PACEART INCLINIC DEVICE CHECK
Brady Statistic RA Percent Paced: 42 %
Brady Statistic RV Percent Paced: 99.62 %
Implantable Lead Location: 753859
Implantable Lead Location: 753860
Lead Channel Impedance Value: 425 Ohm
Lead Channel Pacing Threshold Amplitude: 0.5 V
Lead Channel Pacing Threshold Amplitude: 0.5 V
Lead Channel Pacing Threshold Amplitude: 0.75 V
Lead Channel Pacing Threshold Amplitude: 0.75 V
Lead Channel Pacing Threshold Pulse Width: 0.4 ms
Lead Channel Pacing Threshold Pulse Width: 0.4 ms
Lead Channel Sensing Intrinsic Amplitude: 2.3 mV
Lead Channel Setting Pacing Pulse Width: 0.4 ms
MDC IDC LEAD IMPLANT DT: 20171020
MDC IDC LEAD IMPLANT DT: 20171020
MDC IDC MSMT BATTERY VOLTAGE: 3.01 V
MDC IDC MSMT LEADCHNL RA PACING THRESHOLD PULSEWIDTH: 0.4 ms
MDC IDC MSMT LEADCHNL RV IMPEDANCE VALUE: 587.5 Ohm
MDC IDC MSMT LEADCHNL RV PACING THRESHOLD PULSEWIDTH: 0.4 ms
MDC IDC MSMT LEADCHNL RV SENSING INTR AMPL: 5.1 mV
MDC IDC PG IMPLANT DT: 20171020
MDC IDC PG SERIAL: 3180497
MDC IDC SESS DTM: 20180201161532
MDC IDC SET LEADCHNL RA PACING AMPLITUDE: 2 V
MDC IDC SET LEADCHNL RV PACING AMPLITUDE: 2.5 V
MDC IDC SET LEADCHNL RV SENSING SENSITIVITY: 4 mV
Pulse Gen Model: 2272

## 2016-07-06 NOTE — Patient Instructions (Signed)
Your physician wants you to follow-up in: 9 Months with Dr. Lovena Le. You will receive a reminder letter in the mail two months in advance. If you don't receive a letter, please call our office to schedule the follow-up appointment.  Remote monitoring is used to monitor your Pacemaker of ICD from home. This monitoring reduces the number of office visits required to check your device to one time per year. It allows [place] to keep an eye on the functioning of your device to ensure it is working properly. You are scheduled for a device check from home on 10/05/16. You may send your transmission at any time that day. If you have a wireless device, the transmission will be sent automatically. After your physician reviews your transmission, you will receive a postcard with your next transmission date.  Your physician recommends that you continue on your current medications as directed. Please refer to the Current Medication list given to you today.  If you need a refill on your cardiac medications before your next appointment, please call your pharmacy.  Thank you for choosing [facility]!

## 2016-07-06 NOTE — Progress Notes (Signed)
HPI [name] returns today for ongoing PPM followup. 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 She has been stable from a heart rhythm perspective. She has had problems with nausea, vomiting and is being treated for H.Pylori. She is anemic with hemoglobin down almost 2 grams since September 2017. Denies constipation. Allergies  Allergen Reactions  . Ace Inhibitors Hives, Shortness Of Breath and Swelling  . Aspirin Other (See Comments)    Blood in stool and nose bleed  . Codeine Swelling  . Pineapple Shortness Of Breath and Swelling  . Plasticized Base [Plastibase] Hives and Swelling    No plastics  . Ultram [Tramadol] Swelling  . Adhesive [Tape] Swelling and Rash  . Naprosyn [Naproxen] Swelling  . Biaxin [Clarithromycin] Nausea And Vomiting and Other (See Comments)    Dizziness.  . Clindamycin/Lincomycin Other (See Comments)    Burning sensation throat, abdomen  . Linzess [Linaclotide]     DIARRHEA, FECAL INCONTINENCE  . Metronidazole     NAUSEA AND WEAKNESS  . Nsaids   . Penicillins     Lip swelling, Hives Has patient had a PCN reaction causing immediate rash, facial/tongue/throat swelling, SOB or lightheadedness with hypotension:YES Has patient had a PCN reaction causing severe rash involving mucus membranes or skin necrosis: NO Has patient had a PCN reaction that required hospitalization NO Has patient had a PCN reaction occurring within the last 10 years: NO If all of the above answers are "NO", then may proceed with Cephalosporin use.  . Varenicline Tartrate Swelling     Current Outpatient Prescriptions  Medication Sig Dispense Refill  . acetaminophen (TYLENOL) 500 MG tablet Take 500 mg by mouth every 6 (six) hours as needed for mild pain or moderate pain.    Marland Kitchen albuterol (PROVENTIL HFA;VENTOLIN HFA) 108 (90 Base) MCG/ACT inhaler Inhale 2 puffs into the lungs every 4 (four) hours as needed  for wheezing or shortness of breath. 1 Inhaler 2  . ALPRAZolam (XANAX) 0.25 MG tablet Take 1 tablet (0.25 mg total) by mouth at bedtime as needed. 30 tablet 1  . calcium citrate-vitamin D (CITRACAL+D) 315-200 MG-UNIT per tablet Take 1 tablet by mouth daily.      . Cranberry 250 MG CAPS Take 250 mg by mouth daily.    . Cyanocobalamin (VITAMIN B 12 PO) Take 1 tablet by mouth daily.     Marland Kitchen esomeprazole (NEXIUM) 40 MG capsule Take 1 capsule (40 mg total) by mouth 2 (two) times daily before a meal. FOR 14 DAYS 28 capsule 0  . Fluticasone-Salmeterol (ADVAIR) 100-50 MCG/DOSE AEPB Inhale 1 puff into the lungs 2 (two) times daily. (Patient taking differently: Inhale 1 puff into the lungs 2 (two) times daily as needed (Shortness of breath). ) 1 each 3  . gabapentin (NEURONTIN) 100 MG capsule Take 1-2 capsules at bedtime for nerve pain (Patient taking differently: Take 100 mg by mouth at bedtime. Take 1-2 capsules at bedtime for nerve pain) 60 capsule 2  . mirabegron ER (MYRBETRIQ) 25 MG TB24 tablet Take 1 tablet (25 mg total) by mouth at bedtime. For bladder (Patient taking differently: Take 25 mg by mouth as needed (uinary frequency). For bladder) 30 tablet 6  . pravastatin (PRAVACHOL) 20 MG tablet TAKE ONE TABLET BY MOUTH ONCE DAILY. 30 tablet 0  . Probiotic Product (PROBIOTIC DAILY PO) Take 1 capsule by mouth daily.     . promethazine (PHENERGAN)  12.5 MG tablet 1-2 po 30 MINS PRIOR TO MEDS OR MEALS. MAY REPEAT EVERY 4-6 h prn nausea or vomiting 40 tablet 1  . rivaroxaban (XARELTO) 20 MG TABS tablet Take 1 tablet (20 mg total) by mouth daily with supper. 30 tablet 11  . Tiotropium Bromide Monohydrate (SPIRIVA RESPIMAT) 1.25 MCG/ACT AERS Inhale 2 puffs into the lungs daily. 1 Inhaler 11  . valsartan (DIOVAN) 160 MG tablet TAKE 1 TABLET BY MOUTH DAILY. 90 tablet 0  . ZETIA 10 MG tablet TAKE 1 TABLET BY MOUTH ONCE A DAY. 30 tablet 6   No current facility-administered medications for this visit.      Past  Medical History:  Diagnosis Date  . 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 ASCVD (arteriosclerotic cardiovascular disease)    a. cath in 4/04,-80%LAD; 70% RCA, -> DESx2; residual 60% distal RCA; nl EF  b. Nuc 02/2016 aborted due to bradycardia. Will need to be rescheduled   . Carotid artery occlusion   . Cerebrovascular disease   . CHB (complete heart block) ([place])    a. s/p PPM on 03/24/16 with a St. Jude (serial number [id number]) pacemaker  . Chronic kidney disease (CKD), stage III (moderate)   . Clostridium difficile colitis 03/2013   a. 03/2013  . COPD (chronic obstructive pulmonary disease) ([place])   . Coronary disease   . Diabetes mellitus   . Diverticulosis   . GERD (gastroesophageal reflux disease)   . Gout   . H pylori ulcer   . Hyperlipidemia   . Hypertension   . IBS (irritable bowel syndrome)   . PVD (peripheral vascular disease) ([place])    a. moderate to severely decreased ABI's in 8/08, sig. aortic inflow disease b. repeat ABI's in 2011 improved- mild disease on the left and moderate to severe disease on the right  . S/P placement of cardiac pacemaker    a. 03/24/16: St. Jude (serial number [id number]) pacemaker  . TIA (transient ischemic attack) 2012  . Tobacco abuse    a. 50 pack years continuing at 1/2 pack per day    ROS:   All systems reviewed and negative except as noted in the HPI.   Past Surgical History:  Procedure Laterality Date  . ABDOMINAL HYSTERECTOMY    . carpel tunnel release     bilateral  . CHOLECYSTECTOMY    . COLONOSCOPY  2009  . EP IMPLANTABLE DEVICE N/A 03/24/2016   Procedure: Pacemaker Implant;  Surgeon: [name], MD;  Location: [facility];  Service: Cardiovascular;  Laterality: N/A;  . TOTAL ABDOMINAL HYSTERECTOMY W/ BILATERAL SALPINGOOPHORECTOMY       Family History  Problem Relation Age of Onset  . Liver disease Mother 83  . Hypertension Mother   . Cerebral aneurysm Father   . Aneurysm Father 53  . Cancer Sister   .  Liver disease Brother   . Liver disease Brother   . Liver disease Brother   . Diabetes type II Brother   . Alcohol abuse Brother   . Cancer Sister      Social History   Social History  . Marital status: Widowed    Spouse name: N/A  . Number of children: N/A  . Years of education: N/A   Occupational History  . Not on file.   Social History Main Topics  . Smoking status: Current Every Day Smoker    Packs/day: 0.50    Years: 50.00    Types: Cigarettes    Start date:  02/02/1966  . Smokeless tobacco: Never Used     Comment: 1/2 pack daily  . Alcohol use No  . Drug use: No  . Sexual activity: Not Currently   Other Topics Concern  . Not on file   Social History Narrative  . No narrative on file     BP 110/70   Pulse 74   Ht '5\' 7"'$  (1.702 m)   Wt 148 lb (67.1 kg)   SpO2 94%   BMI 23.18 kg/m   Physical Exam:  Well appearing 76 yo woman, NAD HEENT: Unremarkable Neck:  No JVD, no thyromegally Lymphatics:  No adenopathy Back:  No CVA tenderness Lungs:  Clear HEART:  Regular brady rhythm, no murmurs, no rubs, no clicks Abd:  soft, positive bowel sounds, no organomegally, no rebound, no guarding Ext:  2 plus pulses, no edema, no cyanosis, no clubbing Skin:  No rashes no nodules Neuro:  CN II through XII intact, motor grossly intact  EKG - sinus bradycardia with 3:2 and 2:1 AV block  Assess/Plan: 1. High grade heart block -she is s/p PPM insertion. She is asymptomatic now 2. HTN - her blood pressure is well controlled on medical therapy. 3. COPD - she is encouraged to stop smoking. She will continue bronchodilator therapy.  4. Peripheral vascular disease - she has known disease including carotid disease as well. She has been encouraged to stop smoking. She has followup with Dr. Scot Dock.  Mikle Bosworth.D.

## 2016-07-07 ENCOUNTER — Ambulatory Visit (INDEPENDENT_AMBULATORY_CARE_PROVIDER_SITE_OTHER): Payer: Medicare HMO | Admitting: Family Medicine

## 2016-07-07 ENCOUNTER — Encounter: Payer: Self-pay | Admitting: Family Medicine

## 2016-07-07 VITALS — BP 98/62 | HR 76 | Temp 98.5°F | Resp 18 | Ht 67.0 in | Wt 148.0 lb

## 2016-07-07 DIAGNOSIS — R29898 Other symptoms and signs involving the musculoskeletal system: Secondary | ICD-10-CM | POA: Diagnosis not present

## 2016-07-07 DIAGNOSIS — R531 Weakness: Secondary | ICD-10-CM | POA: Diagnosis not present

## 2016-07-07 DIAGNOSIS — D649 Anemia, unspecified: Secondary | ICD-10-CM

## 2016-07-07 DIAGNOSIS — N183 Chronic kidney disease, stage 3 unspecified: Secondary | ICD-10-CM

## 2016-07-07 DIAGNOSIS — I959 Hypotension, unspecified: Secondary | ICD-10-CM

## 2016-07-07 NOTE — Progress Notes (Signed)
Subjective:    Patient ID: Charlotte Henry, female    DOB: 11/15/1940, 76 y.o.   MRN: [id number]  Patient presents for Hospital F/U (weakness)  Patient here for follow-up. She is here today with her daughter. She was seen in the emergency room earlier this week secondary to dehydration. She been having nausea and vomiting after taking antibiotics for H. pylori  after evaluation for anemia as well as weight loss . The antibiotics however  did not agree with her. Gastroenterology check her labs her creatinine was up to 1.44 because of nausea vomiting recommended that she gets the ED for a bolus of fluids. After significant discussion with her about the importance of fluid she finally agreed to have this done. She did have i-STAT done in the emergency room which showed creatinine 1.4 with a BUN of 40 she does have history of chronic kidney disease stage II at baseline ( BUN 17 creatinine 0.95 approximately 3 weeks ago). She did have a normal lactic acid. Her CBC showed a hemoglobin of 10.8 blood cell count 7.3     She was seen by her cardiologist yesterday she has history of bradycardia due to AV block she had recent procedure about 3 months ago to help treat this. Her blood pressure yesterday was 110/70 there were no change in her medications.  Today  He still feeling weak. She did eat liver and rice last night in that state down. She states that she is just too tired to get up to eat anything.  Review Of Systems:  GEN- denies fatigue, fever, weight loss,weakness, recent illness HEENT- denies eye drainage, change in vision, nasal discharge, CVS- denies chest pain, palpitations RESP- denies SOB, cough, wheeze ABD- denies N/V, change in stools, abd pain GU- denies dysuria, hematuria, dribbling, incontinence MSK- denies joint pain, muscle aches, injury Neuro- denies headache, dizziness, syncope, seizure activity       Objective:    BP 98/62 (BP Location: Left Arm, Patient Position: Sitting,  Cuff Size: Normal)   Pulse 76   Temp 98.5 F (36.9 C) (Oral)   Resp 18   Ht '5\' 7"'$  (1.702 m)   Wt 148 lb (67.1 kg)   BMI 23.18 kg/m  GEN- NAD, alert and oriented x3  BP recheck 100/ 62,walking with cane  HEENT- PERRL, EOMI, non injected sclera, pink conjunctiva, MMM, oropharynx clear CVS- RRR, systolic  murmur RESP-CTAB ABD-NABS,soft,NT,ND MSK- Decreased ROM upper and lower ext, strength decreased bilat, ambulates slow and unsteady with cane  EXT- No edema Pulses- Radial  2+        Assessment & Plan:      Problem List Items Addressed This Visit    Chronic kidney disease (CKD), stage III (moderate) - Primary    Increase in renal function due to dehydration with GI losses Will recheck today along with electrolytes       Relevant Orders   CBC with Differential/Platelet (Completed)   Comprehensive metabolic panel (Completed)   Anemia    Currently being worked up by GI as anemia of chronic disease  She may have some diluation with recent fluids Her iron levels did return low on recent check, denies any gross blood in stools Will have her obtain stool FOBT x 3 as well        Other Visit Diagnoses    Hypotension, unspecified hypotension type       hypotension in setting of her GI loss, dehydration, she is still quite weak. Will d/c diovan.  Previously set up for PT will change to home PT needs assitance at this time with ADL driving   Relevant Orders   CBC with Differential/Platelet (Completed)   Comprehensive metabolic panel (Completed)      Note: This dictation was prepared with Dragon dictation along with smaller phrase technology. Any transcriptional errors that result from this process are unintentional.

## 2016-07-07 NOTE — Patient Instructions (Addendum)
Stop the Diovan  We will call and check on blood pressure next week  We will call with lab results F/U 3 months

## 2016-07-08 LAB — CBC WITH DIFFERENTIAL/PLATELET
Basophils Absolute: 0 cells/uL (ref 0–200)
Basophils Relative: 0 %
EOS PCT: 2 %
Eosinophils Absolute: 144 cells/uL (ref 15–500)
HEMATOCRIT: 29.8 % — AB (ref 35.0–45.0)
Hemoglobin: 9.5 g/dL — ABNORMAL LOW (ref 12.0–15.0)
LYMPHS PCT: 24 %
Lymphs Abs: 1728 cells/uL (ref 850–3900)
MCH: 28.9 pg (ref 27.0–33.0)
MCHC: 31.9 g/dL — AB (ref 32.0–36.0)
MCV: 90.6 fL (ref 80.0–100.0)
MPV: 8.5 fL (ref 7.5–12.5)
Monocytes Absolute: 1080 cells/uL — ABNORMAL HIGH (ref 200–950)
Monocytes Relative: 15 %
NEUTROS PCT: 59 %
Neutro Abs: 4248 cells/uL (ref 1500–7800)
Platelets: 393 10*3/uL (ref 140–400)
RBC: 3.29 MIL/uL — AB (ref 3.80–5.10)
RDW: 13.9 % (ref 11.0–15.0)
WBC: 7.2 10*3/uL (ref 3.8–10.8)

## 2016-07-08 LAB — COMPREHENSIVE METABOLIC PANEL
ALT: 17 U/L (ref 6–29)
AST: 25 U/L (ref 10–35)
Albumin: 3.6 g/dL (ref 3.6–5.1)
Alkaline Phosphatase: 43 U/L (ref 33–130)
BUN: 20 mg/dL (ref 7–25)
CALCIUM: 9.3 mg/dL (ref 8.6–10.4)
CHLORIDE: 107 mmol/L (ref 98–110)
CO2: 29 mmol/L (ref 20–31)
Creat: 1.02 mg/dL — ABNORMAL HIGH (ref 0.60–0.93)
GLUCOSE: 127 mg/dL — AB (ref 70–99)
Potassium: 4.9 mmol/L (ref 3.5–5.3)
SODIUM: 137 mmol/L (ref 135–146)
Total Bilirubin: 0.5 mg/dL (ref 0.2–1.2)
Total Protein: 6.4 g/dL (ref 6.1–8.1)

## 2016-07-09 ENCOUNTER — Encounter: Payer: Self-pay | Admitting: Family Medicine

## 2016-07-09 DIAGNOSIS — R531 Weakness: Secondary | ICD-10-CM | POA: Insufficient documentation

## 2016-07-09 NOTE — Assessment & Plan Note (Signed)
Change to home PT unable to leave home now without assistance

## 2016-07-09 NOTE — Assessment & Plan Note (Signed)
Increase in renal function due to dehydration with GI losses Will recheck today along with electrolytes

## 2016-07-09 NOTE — Assessment & Plan Note (Signed)
Currently being worked up by GI as anemia of chronic disease  She may have some diluation with recent fluids Her iron levels did return low on recent check, denies any gross blood in stools Will have her obtain stool FOBT x 3 as well

## 2016-07-10 ENCOUNTER — Other Ambulatory Visit: Payer: Self-pay | Admitting: *Deleted

## 2016-07-10 DIAGNOSIS — D649 Anemia, unspecified: Secondary | ICD-10-CM

## 2016-07-11 ENCOUNTER — Encounter: Payer: Self-pay | Admitting: Family Medicine

## 2016-07-11 ENCOUNTER — Ambulatory Visit (INDEPENDENT_AMBULATORY_CARE_PROVIDER_SITE_OTHER): Payer: Medicare HMO | Admitting: Family Medicine

## 2016-07-11 VITALS — BP 118/68 | HR 76 | Temp 98.9°F | Resp 16 | Wt 149.8 lb

## 2016-07-11 DIAGNOSIS — D649 Anemia, unspecified: Secondary | ICD-10-CM

## 2016-07-11 DIAGNOSIS — R609 Edema, unspecified: Secondary | ICD-10-CM | POA: Diagnosis not present

## 2016-07-11 DIAGNOSIS — R531 Weakness: Secondary | ICD-10-CM

## 2016-07-11 LAB — CBC
HEMATOCRIT: 27.4 % — AB (ref 35.0–45.0)
Hemoglobin: 9.1 g/dL — ABNORMAL LOW (ref 12.0–15.0)
MCH: 29.9 pg (ref 27.0–33.0)
MCHC: 33.2 g/dL (ref 32.0–36.0)
MCV: 90.1 fL (ref 80.0–100.0)
Platelets: 371 10*3/uL (ref 140–400)
RBC: 3.04 MIL/uL — ABNORMAL LOW (ref 3.80–5.10)
RDW: 14.2 % (ref 11.0–15.0)
WBC: 8.9 10*3/uL (ref 3.8–10.8)

## 2016-07-11 NOTE — Progress Notes (Signed)
   Subjective:    Patient ID: Charlotte Henry, female    DOB: 12-16-1940, 76 y.o.   MRN: [id number]  Patient presents for both legs swollen (x3days)   Hypotension- BP off Diovan 107/68 at home, has been swelling past 3 days,She states the swelling has gone down a lot her feet look like balloons yesterday until she prop them up. Did have recent metabolic panel her albumin was normal.   Decresaed appetite- 1/2 baked potatoe, can ensure, ice cream sandwhich yesterday    Does have anemia her previous hemoglobin last week was 9.5 was planned to have her check stools but she had not picked up the contents yet. She denies any chest pain shortness of breath denies any abdominal pain just feels like her legs are weak.   Review Of Systems:  GEN- denies fatigue, fever, weight loss,+weakness, recent illness HEENT- denies eye drainage, change in vision, nasal discharge, CVS- denies chest pain, palpitations RESP- denies SOB, cough, wheeze ABD- denies N/V, change in stools, abd pain GU- denies dysuria, hematuria, dribbling, incontinence MSK- denies joint pain, muscle aches, injury Neuro- denies headache, dizziness, syncope, seizure activity       Objective:    BP 118/68   Pulse 76   Temp 98.9 F (37.2 C) (Oral)   Resp 16   Wt 149 lb 12.8 oz (67.9 kg)   SpO2 95%   BMI 23.46 kg/m  GEN- NAD, alert and oriented x3  BP recheck 100/ 62,walking with cane  HEENT- PERRL, EOMI, non injected sclera, pink conjunctiva, MMM, oropharynx clear Neck- no JVD  CVS- RRR, systolic  murmur RESP-CTAB ABD-NABS,soft,NT,ND EXT- pitting edema  abkle to mid shins  Pulses- Radial  2+       Assessment & Plan:      Problem List Items Addressed This Visit    Generalized weakness    No sign of infection she is not having chest pain or difficulty breathing her oxygen saturation is 95%      Anemia - Primary    unknown, ? related to H pylori possible ulcer, no current pain, check stool studies, start her on  low dose iron, given fusion plus samples in office today  Hb 9.1 today, previous 9.5, this may be a drop due to multiple blood draws or true slow loss? No sign of any active bright red blood loss or any hematemesis      Relevant Orders   CBC (Completed)    Other Visit Diagnoses    Peripheral edema       likley MTF, poor intatke, recent fluid bolus. BP still on lower end, though repeat was 118/ 68 in office. Given compression hose and elevate feet, will not use Diuretic as she has been recently dehydrated she has poor intake. Discussed using the ensure at least 3 times a day with her meals even though he did not show her last ensure she has a low protein state which also caused some of this edema. I do not see any overt signs of heart failure.       Note: This dictation was prepared with Dragon dictation along with smaller phrase technology. Any transcriptional errors that result from this process are unintentional.

## 2016-07-11 NOTE — Assessment & Plan Note (Signed)
unknown, ? related to H pylori possible ulcer, no current pain, check stool studies, start her on low dose iron, given fusion plus samples in office today

## 2016-07-11 NOTE — Assessment & Plan Note (Signed)
No sign of infection she is not having chest pain or difficulty breathing her oxygen saturation is 95%

## 2016-07-11 NOTE — Patient Instructions (Addendum)
Take prescription to [facility] iron tablet once a day  Do the stool samples Elevate the feet DO NOT TAKE THE DIOVAN F/u 2 months

## 2016-07-12 ENCOUNTER — Telehealth: Payer: Self-pay | Admitting: Family Medicine

## 2016-07-12 ENCOUNTER — Other Ambulatory Visit: Payer: Self-pay | Admitting: Internal Medicine

## 2016-07-12 ENCOUNTER — Telehealth: Payer: Self-pay

## 2016-07-12 NOTE — Telephone Encounter (Signed)
-----   Message from Alycia Rossetti, MD sent at 07/11/2016  1:49 PM EST ----- Regarding: Call pt daughter    Call pt daughter, came in with leg swelling   1. Swelling is likley from multiple things, the fluid recently given, not elevating feet, decreased protein stores, since she was dehydrated I gave compression hose, swelling did improve overnight per mother.   2. Anemia- minimal drop in Hb since last week, started on iron tab once a day, needs to return the stool cards given today to check for blood in stool. Keep appt with GI, possible blood from ?ulcer where H pylori was as well   3. Blood pressure was a little today in office, keep off diovan for now  keep eye on blood pressure  We will call this Friday to check on her

## 2016-07-12 NOTE — Telephone Encounter (Signed)
pts daughter calling in regards to pt visit yesterday wants to speak with you guys about it.

## 2016-07-12 NOTE — Telephone Encounter (Signed)
Call placed to patient and patient daughter Charlotte Henry made aware.

## 2016-07-12 NOTE — Telephone Encounter (Signed)
Called pt daughter to go over he mother's visit on 2-6.lvmtcb

## 2016-07-14 ENCOUNTER — Other Ambulatory Visit: Payer: Medicare HMO

## 2016-07-14 DIAGNOSIS — D649 Anemia, unspecified: Secondary | ICD-10-CM

## 2016-07-15 LAB — FECAL OCCULT BLOOD, IMMUNOCHEMICAL
FECAL OCCULT BLOOD: NEGATIVE
FECAL OCCULT BLOOD: NEGATIVE
Fecal Occult Blood: NEGATIVE

## 2016-07-17 ENCOUNTER — Telehealth: Payer: Self-pay | Admitting: *Deleted

## 2016-07-17 ENCOUNTER — Ambulatory Visit (HOSPITAL_COMMUNITY): Payer: Medicare HMO | Attending: Family Medicine | Admitting: Specialist

## 2016-07-17 DIAGNOSIS — M25512 Pain in left shoulder: Secondary | ICD-10-CM | POA: Diagnosis not present

## 2016-07-17 DIAGNOSIS — R29898 Other symptoms and signs involving the musculoskeletal system: Secondary | ICD-10-CM

## 2016-07-17 DIAGNOSIS — M25511 Pain in right shoulder: Secondary | ICD-10-CM | POA: Insufficient documentation

## 2016-07-17 DIAGNOSIS — M25611 Stiffness of right shoulder, not elsewhere classified: Secondary | ICD-10-CM | POA: Insufficient documentation

## 2016-07-17 DIAGNOSIS — M25612 Stiffness of left shoulder, not elsewhere classified: Secondary | ICD-10-CM | POA: Insufficient documentation

## 2016-07-17 NOTE — Telephone Encounter (Signed)
noted 

## 2016-07-17 NOTE — Patient Instructions (Signed)
Complete each exercise seated at your table.  Perform each exercise 2-3 times per day, for 1-3 minutes each.   SHOULDER: Flexion On Table   Place hands on table, elbows straight, hands on towel.  Slide towel forward leaning forward as able, hold.  Return to upright position. Abduction (Passive)   With arm out to side, resting on table, palm down, slide arm across table leaning to the side as you are able.  Hold, return to upright position.  Copyright  VHI. All rights reserved.     Internal Rotation (Assistive)   Seated with elbow bent at right angle and held against side, slide arm on table surface in an inward arc.  Activity: Use this motion to brush crumbs off the table.  Copyright  VHI. All rights reserved.

## 2016-07-17 NOTE — Therapy (Signed)
[place] [place], [place], [address] Phone: [phone]   Fax:  [phone]  Occupational Therapy Evaluation  Patient Details  Name: Charlotte Henry MRN: [id number] Date of Birth: 04/07/1941 Referring Provider: Dr. [name]  Encounter Date: 07/17/2016      OT End of Session - 07/17/16 1602    Visit Number 1   Number of Visits 8   Date for OT Re-Evaluation 09/15/16   Authorization Type [employer]   Authorization Time Period before 10th visit   Authorization - Visit Number 1   Authorization - Number of Visits 10   OT Start Time 8185   OT Stop Time 1430   OT Time Calculation (min) 45 min   Activity Tolerance Patient tolerated treatment well   Behavior During Therapy [facility] for tasks assessed/performed      Past Medical History:  Diagnosis Date  . Arterial fibromuscular dysplasia ([place])   . ASCVD (arteriosclerotic cardiovascular disease)    a. cath in 4/04,-80%LAD; 70% RCA, -> DESx2; residual 60% distal RCA; nl EF  b. Nuc 02/2016 aborted due to bradycardia. Will need to be rescheduled   . Carotid artery occlusion   . Cerebrovascular disease   . CHB (complete heart block) ([place])    a. s/p PPM on 03/24/16 with a St. Jude (serial number [id number]) pacemaker  . Chronic kidney disease (CKD), stage III (moderate)   . Clostridium difficile colitis 03/2013   a. 03/2013  . COPD (chronic obstructive pulmonary disease) ([place])   . Coronary disease   . Diabetes mellitus   . Diverticulosis   . GERD (gastroesophageal reflux disease)   . Gout   . H pylori ulcer   . Hyperlipidemia   . Hypertension   . IBS (irritable bowel syndrome)   . PVD (peripheral vascular disease) ([place])    a. moderate to severely decreased ABI's in 8/08, sig. aortic inflow disease b. repeat ABI's in 2011 improved- mild disease on the left and moderate to severe disease on the right  . S/P placement of cardiac pacemaker    a. 03/24/16: St. Jude  (serial number [id number]) pacemaker  . TIA (transient ischemic attack) 2012  . Tobacco abuse    a. 50 pack years continuing at 1/2 pack per day    Past Surgical History:  Procedure Laterality Date  . ABDOMINAL HYSTERECTOMY    . carpel tunnel release     bilateral  . CHOLECYSTECTOMY    . COLONOSCOPY  2009  . EP IMPLANTABLE DEVICE N/A 03/24/2016   Procedure: Pacemaker Implant;  Surgeon: [name], MD;  Location: [facility];  Service: Cardiovascular;  Laterality: N/A;  . TOTAL ABDOMINAL HYSTERECTOMY W/ BILATERAL SALPINGOOPHORECTOMY      There were no vitals filed for this visit.      Subjective Assessment - 07/17/16 1556    Subjective  S:  A few months ago, I noticed that I couldnt get my shirts on and off anymore.  I just can't raise my arms up.    Pertinent History Charlotte Henry reports independence with all B/IADLs and leisure activities up until a few months ago. A few months ago she reports decreased ability to lift her arms above shoulder height.  In January she developed an infection and the antibiotics she took made her weak and made upper and lower extremity movement even more difficult.  Dr. Buelah Manis has referred her to occupatonal therapy for evaluation and treatment.    Patient Stated  Goals I want to "sup" up my arms so that they work better.    Currently in Pain? Yes   Pain Score 4    Pain Location Shoulder   Pain Orientation Right;Left   Pain Descriptors / Indicators Aching   Pain Type Acute pain   Pain Onset More than a month ago   Pain Frequency Intermittent   Aggravating Factors  movement or laying on her arms, she trys to avoid getting to this pain level by not moving her arms as much   Pain Relieving Factors decreased movement of arms   Effect of Pain on Daily Activities decreased ability to complete ADLs independently and increased time to complete ADLs.            [facility] OT Assessment - 07/17/16 0001      Assessment   Diagnosis bilateral shoulder  weakness   Referring Provider Dr. [name]   Onset Date --  December 2017     Precautions   Precautions ICD/Pacemaker     Restrictions   Weight Bearing Restrictions No     Balance Screen   Has the patient fallen in the past 6 months Yes  requested a PT referral from Dr. Buelah Manis   How many times? 1   Has the patient had a decrease in activity level because of a fear of falling?  Yes   Is the patient reluctant to leave their home because of a fear of falling?  Yes     Home  Environment   Family/patient expects to be discharged to: Private residence   Lives With Family     Prior Function   Level of Independence Independent  was driving, cooking, cleaning   Vocation Retired   Leisure spending time with family     ADL   ADL comments due to shoulder weakness and general weakness from recent illness, patient is not able to drive, has increased difficulty dressing and and bathing, unable to cook and clean for herself.  Patient reports that she tires easily.       Written Expression   Dominant Hand Right     Vision - History   Baseline Vision No visual deficits     Activity Tolerance   Activity Tolerance Comments patient reports a recent decline in her activity tolerance     Cognition   Overall Cognitive Status Within Functional Limits for tasks assessed     Observation/Other Assessments   Focus on Therapeutic Outcomes (FOTO)  clinical observation, complete FOTO at next visit     Sensation   Light Touch Appears Intact     Coordination   Gross Motor Movements are Fluid and Coordinated Yes   Fine Motor Movements are Fluid and Coordinated Yes     ROM / Strength   AROM / PROM / Strength AROM;PROM;Strength     Palpation   Palpation comment moderate fascial restrictions in bilateral shoulder regions     AROM   Overall AROM Comments assessed in seated, external and internal rotation with shoulder adducted    AROM Assessment Site Shoulder   Right/Left Shoulder  Right;Left   Right Shoulder Flexion 70 Degrees   Right Shoulder ABduction 54 Degrees   Right Shoulder Internal Rotation 80 Degrees   Right Shoulder External Rotation 0 Degrees   Left Shoulder Flexion 60 Degrees   Left Shoulder ABduction 50 Degrees   Left Shoulder Internal Rotation 90 Degrees   Left Shoulder External Rotation 0 Degrees     PROM  Overall PROM Comments assessed in seated, external rotation and internal rotation assessed with shoulder adducted to 0   PROM Assessment Site Shoulder   Right/Left Shoulder Right;Left   Right Shoulder Flexion 80 Degrees   Right Shoulder ABduction 120 Degrees   Right Shoulder Internal Rotation 80 Degrees   Right Shoulder External Rotation 40 Degrees   Left Shoulder Flexion 80 Degrees   Left Shoulder ABduction 56 Degrees   Left Shoulder Internal Rotation 90 Degrees   Left Shoulder External Rotation 45 Degrees     Strength   Overall Strength Comments assessed in seated, rotation with shoulder adducted   Strength Assessment Site Shoulder   Right/Left Shoulder Right;Left   Right Shoulder Flexion 3-/5   Right Shoulder ABduction 3+/5   Right Shoulder Internal Rotation 3+/5   Right Shoulder External Rotation 3+/5   Left Shoulder Flexion 3-/5   Left Shoulder ABduction 3+/5   Left Shoulder Internal Rotation 3+/5   Left Shoulder External Rotation 3+/5                         OT Education - 07/17/16 1601    Education provided Yes   Education Details educated on table slides for bilateral shoulders, flexion, abduction, external rotation/internal rotation   Person(s) Educated Patient   Methods Explanation;Demonstration;Handout   Comprehension Verbalized understanding          OT Short Term Goals - 07/17/16 1608      OT SHORT TERM GOAL #1   Title Patient will be educated on a HEP for bilateral upper extremity improved functional use.    Time 4   Period Weeks   Status New     OT SHORT TERM GOAL #2   Title Patient  will improve P/ROM of bilateral shoulders to [facility] for improved ability to don and doff shirts and bathe herself.   Time 4   Period Weeks   Status New     OT SHORT TERM GOAL #3   Title Patient will improve bilateral shoulder strength to 4-/5 for improved ability to lift bags of groceries.    Time 4   Period Weeks   Status New     OT SHORT TERM GOAL #4   Title Patient will decrease pain when moving her shoulders to 3/10 or better.    Time 4   Status New           OT Long Term Goals - 07/17/16 1610      OT LONG TERM GOAL #1   Title Patient will return to prior level of function with all B/IADLs and leisure activities.    Time 8   Period Weeks   Status New     OT LONG TERM GOAL #2   Title Patient will be educated and independent with work simplification and energy conservation techniques to utilize when performing daily activities.    Time 8   Period Weeks   Status New     OT LONG TERM GOAL #3   Title Patient will improve bilateral shoulder A/ROM to 100 degrees flexion/abduction and 45 degrees rotation or better for improved ability to complete functional activities independently.    Time 8   Period Weeks   Status New     OT LONG TERM GOAL #4   Title Patient will improve bilateral shoulder strength to 4/5 for improved ability to lift pots and pans when cooking.    Time 8   Period Weeks   Status  New     OT LONG TERM GOAL #5   Title Patient will decrease bilateral shoulder pain to 2/10 or better when completing daily tasks.    Time 8   Period Weeks   Status New     Long Term Additional Goals   Additional Long Term Goals Yes     OT LONG TERM GOAL #6   Title Patient will decrease bilateral shoulder fascial restrictions to minimal for improved abilty to utilize bilateral arms with functional activities.    Time 8   Period Weeks   Status New               Plan - 07/17/16 1603    Clinical Impression Statement A:  Patient is a 76 year old female with past  medical history significant for pacemaker placement in October 2017, infection, TIA, PVD, diabetes, CKD, COPD.  Patient states that a few months ago, she began having increased difficulty moving her arms above waist height.  This deficit has caused her to have decreased independence with daily activities such as dressing and bathing, unable to drive, and unable to complete cooking and cleaning tasks that she would like to complete.     Rehab Potential Good   OT Frequency 2x / week   OT Duration 8 weeks   OT Treatment/Interventions Self-care/ADL training;Cryotherapy;Therapeutic exercise;Neuromuscular education;Passive range of motion;Manual Therapy;Therapeutic exercises;Therapeutic activities;Patient/family education   Plan P:  Patient will benefit from skilled OT intervention to decrease pain and restrictions and improve pain free mobility and strength in her shoulders in order to return to prior level of function with all B/IADLs and leisure tasks.  Next visit:  review plan of care, begin manual therapy, P/ROM, AA/ROM and progress as tolerated.     OT Home Exercise Plan 07/17/16:  towel slides    Consulted and Agree with Plan of Care Patient      Patient will benefit from skilled therapeutic intervention in order to improve the following deficits and impairments:  Cardiopulmonary status limiting activity, Decreased range of motion, Decreased strength, Increased fascial restricitons, Impaired UE functional use, Increased muscle spasms, Pain  Visit Diagnosis: Acute pain of left shoulder - Plan: Ot plan of care cert/re-cert  Acute pain of right shoulder - Plan: Ot plan of care cert/re-cert  Stiffness of left shoulder, not elsewhere classified - Plan: Ot plan of care cert/re-cert  Stiffness of right shoulder, not elsewhere classified - Plan: Ot plan of care cert/re-cert  Other symptoms and signs involving the musculoskeletal system - Plan: Ot plan of care cert/re-cert      G-Codes - 23/55/73  1640    Functional Assessment Tool Used clinical judgememt    Functional Limitation Self care   Self Care Current Status (U2025) At least 60 percent but less than 80 percent impaired, limited or restricted   Self Care Goal Status (K2706) At least 20 percent but less than 40 percent impaired, limited or restricted      Problem List Patient Active Problem List   Diagnosis Date Noted  . Generalized weakness 07/09/2016  . Loss of weight 07/03/2016  . Anemia 07/03/2016  . Weakness of both upper extremities 06/14/2016  . CHB (complete heart block) ([place])   . COPD (chronic obstructive pulmonary disease) ([place])   . Weight loss, unintentional 12/23/2015  . Anorexia 12/23/2015  . Tobacco use disorder 10/15/2015  . Back pain 12/30/2014  . OA (osteoarthritis) 12/30/2014  . Carotid stenosis 03/11/2014  . PVD (peripheral vascular disease) ([place]) 03/11/2014  .  Colon cancer screening 02/04/2014  . Overactive bladder 12/31/2013  . Rash and nonspecific skin eruption 05/19/2013  . Numbness and tingling-bilat foot 03/05/2013  . Weakness of hand- bilat 03/05/2013  . IBS (irritable bowel syndrome) 12/30/2012  . Peripheral neuropathy ([place]) 07/01/2012  . Insomnia 07/01/2012  . Chronic kidney disease (CKD), stage III (moderate) 08/25/2010  . Cardiovascular disease 08/16/2009  . Cerebrovascular disease 08/16/2009  . Peripheral vascular disease ([place]) 08/16/2009  . Diabetes mellitus ([place]) 10/26/2008  . Hyperlipidemia 10/26/2008  . Gout, unspecified 10/26/2008  . Essential hypertension 10/26/2008  . Asthmatic bronchitis 10/26/2008  . Gastroesophageal reflux disease 10/26/2008    Vangie Bicker, [place], OTR/L [phone]  07/17/2016, 4:58 PM  [facility] [address] [place], [place], [address] Phone: [phone]   Fax:  [phone]  Name: Charlotte Henry MRN: [id number] Date of Birth: Aug 23, 1940

## 2016-07-17 NOTE — Telephone Encounter (Signed)
Received call from patient.   Inquired as to results of stool test. Advised that no blood noted in stool samples. Inquired as to next step for treatment. Advised to begin OTC iron. Referral has been placed for GI.   Also states that she stopped taking Pravastatin. Reports that she is feeling much improved since stopping statin therapy. States that she does not feel as weak since stopping statin.   MD to be made aware.

## 2016-07-18 ENCOUNTER — Telehealth: Payer: Self-pay | Admitting: Family Medicine

## 2016-07-18 DIAGNOSIS — R29898 Other symptoms and signs involving the musculoskeletal system: Secondary | ICD-10-CM

## 2016-07-18 DIAGNOSIS — R2681 Unsteadiness on feet: Secondary | ICD-10-CM

## 2016-07-18 NOTE — Telephone Encounter (Signed)
Referral placed for Physical Therapy.

## 2016-07-18 NOTE — Telephone Encounter (Signed)
-----   Message from Alycia Rossetti, MD sent at 07/17/2016  4:22 PM EST -----  Okay to place PT order , see below  ----- Message ----- From: Vangie Bicker, OTR Sent: 07/17/2016   3:54 PM To: Alycia Rossetti, MD  Hi Dr. Buelah Manis - Thank you so much for referring Ms. Helfrich to occupational therapy for upper extremity weakness.  During her evaluation today, she noted that she has been experiencing increased difficulty getting out of chairs and difficulty walking.  I feel she may benefit from a physical therapy evaluation.  If you agree, could you put a referral in EPIC for PT to eval and treat at [facility]?   Thanks so much in advance. Vangie Bicker, [place], OTR/L [phone]

## 2016-07-21 ENCOUNTER — Ambulatory Visit (HOSPITAL_COMMUNITY): Payer: Medicare HMO

## 2016-07-21 ENCOUNTER — Telehealth (HOSPITAL_COMMUNITY): Payer: Self-pay

## 2016-07-21 NOTE — Telephone Encounter (Signed)
Called patient regarding no show. Patient mixed up time and thought her appointment was on 2:00 not 1:00. Unable to reschedule for today. Patient was reminded that her next appointment is this Monday at 1:45. Also let patient know that we can reschedule her missed appointment if needed at the end of her scheduled appointments.   Ailene Ravel, OTR/L,CBIS  [phone]

## 2016-07-24 ENCOUNTER — Ambulatory Visit (HOSPITAL_COMMUNITY): Payer: Medicare HMO

## 2016-07-24 DIAGNOSIS — M25611 Stiffness of right shoulder, not elsewhere classified: Secondary | ICD-10-CM

## 2016-07-24 DIAGNOSIS — M25511 Pain in right shoulder: Secondary | ICD-10-CM

## 2016-07-24 DIAGNOSIS — M25512 Pain in left shoulder: Secondary | ICD-10-CM | POA: Diagnosis not present

## 2016-07-24 DIAGNOSIS — R29898 Other symptoms and signs involving the musculoskeletal system: Secondary | ICD-10-CM

## 2016-07-24 DIAGNOSIS — M25612 Stiffness of left shoulder, not elsewhere classified: Secondary | ICD-10-CM

## 2016-07-24 NOTE — Patient Instructions (Signed)
WAND EXTERNAL ROTATION - SUPINE ER  Lie on your back holding a cane or wand with both hands.   On the affected side, place a small rolled up towel or pillow under your elbow. Maintain approx. 90 degree bend at the elbow with your arm approximately 30-45 degrees away from your side.   Use your other arm to pull the wand/cane to rotate the affected arm back into a stretch. Hold and then return to starting position and then repeat.

## 2016-07-24 NOTE — Therapy (Signed)
[place], [place], [address] Phone: [phone]   Fax:  [phone]  Occupational Therapy Treatment  Patient Details  Name: Charlotte Henry MRN: [id number] Date of Birth: [birth year] Referring Provider: Dr. [name]  Encounter Date: 07/24/2016      OT End of Session - 07/24/16 1548    Visit Number 2   Number of Visits 8   Date for OT Re-Evaluation 09/15/16   Authorization Type [employer]   Authorization Time Period before 10th visit   Authorization - Visit Number 2   Authorization - Number of Visits 10   OT Start Time 2751   OT Stop Time 1435   OT Time Calculation (min) 48 min   Activity Tolerance Patient tolerated treatment well   Behavior During Therapy [facility] for tasks assessed/performed      Past Medical History:  Diagnosis Date  . Arterial fibromuscular dysplasia ([place])   . ASCVD (arteriosclerotic cardiovascular disease)    a. cath in 4/04,-80%LAD; 70% RCA, -> DESx2; residual 60% distal RCA; nl EF  b. Nuc 02/2016 aborted due to bradycardia. Will need to be rescheduled   . Carotid artery occlusion   . Cerebrovascular disease   . CHB (complete heart block) ([place])    a. s/p PPM on 03/24/16 with a St. Jude (serial number [id number]) pacemaker  . Chronic kidney disease (CKD), stage III (moderate)   . Clostridium difficile colitis 03/2013   a. 03/2013  . COPD (chronic obstructive pulmonary disease) ([place])   . Coronary disease   . Diabetes mellitus   . Diverticulosis   . GERD (gastroesophageal reflux disease)   . Gout   . H pylori ulcer   . Hyperlipidemia   . Hypertension   . IBS (irritable bowel syndrome)   . PVD (peripheral vascular disease) ([place])    a. moderate to severely decreased ABI's in 8/08, sig. aortic inflow disease b. repeat ABI's in 2011 improved- mild disease on the left and moderate to severe disease on the right  . S/P placement of cardiac pacemaker    a. 03/24/16: St. Jude (serial  number [id number]) pacemaker  . TIA (transient ischemic attack) 2012  . Tobacco abuse    a. 50 pack years continuing at 1/2 pack per day    Past Surgical History:  Procedure Laterality Date  . ABDOMINAL HYSTERECTOMY    . carpel tunnel release     bilateral  . CHOLECYSTECTOMY    . COLONOSCOPY  2009  . EP IMPLANTABLE DEVICE N/A 03/24/2016   Procedure: Pacemaker Implant;  Surgeon: [name], MD;  Location: [place] [facility];  Service: Cardiovascular;  Laterality: N/A;  . TOTAL ABDOMINAL HYSTERECTOMY W/ BILATERAL SALPINGOOPHORECTOMY      There were no vitals filed for this visit.      Subjective Assessment - 07/24/16 1424    Subjective  S: I just want to focus on my legs.    Currently in Pain? No/denies            [facility] OT Assessment - 07/24/16 1544      Assessment   Diagnosis bilateral shoulder weakness     Precautions   Precautions ICD/Pacemaker                  OT Treatments/Exercises (OP) - 07/24/16 1544      Exercises   Exercises Shoulder     Shoulder Exercises: Supine   Protraction PROM;AAROM;10 reps;Both   Horizontal ABduction PROM;AAROM;10 reps;Both  External Rotation PROM;AAROM;10 reps;Both   Internal Rotation PROM;AAROM;10 reps;Both   Flexion PROM;AAROM;10 reps;Both   ABduction PROM;10 reps;Both     Manual Therapy   Manual Therapy Myofascial release   Manual therapy comments Manual therapy completed prior to exercises.   Myofascial Release Myofascial release and manual stretching completed to bilateral upper arms, trapezius, and scapularis region to decrease fascial restrictions and increase joint mobility in a pain free zone.                 OT Education - 07/24/16 1547    Education provided Yes   Education Details Pt given print out of OT evaluation for plan of care and goal reference. Patient was given External rotation stretch to complete with cane at home.   Person(s) Educated Patient   Methods  Explanation;Demonstration;Verbal cues;Handout   Comprehension Verbalized understanding          OT Short Term Goals - 07/24/16 1551      OT SHORT TERM GOAL #1   Title Patient will be educated on a HEP for bilateral upper extremity improved functional use.    Time 4   Period Weeks   Status Not Met     OT SHORT TERM GOAL #2   Title Patient will improve P/ROM of bilateral shoulders to [facility] for improved ability to don and doff shirts and bathe herself.   Time 4   Period Weeks   Status Not Met     OT SHORT TERM GOAL #3   Title Patient will improve bilateral shoulder strength to 4-/5 for improved ability to lift bags of groceries.    Time 4   Period Weeks   Status Not Met     OT SHORT TERM GOAL #4   Title Patient will decrease pain when moving her shoulders to 3/10 or better.    Time 4   Status Not Met           OT Long Term Goals - 07/24/16 1551      OT LONG TERM GOAL #1   Title Patient will return to prior level of function with all B/IADLs and leisure activities.    Time 8   Period Weeks   Status Not Met     OT LONG TERM GOAL #2   Title Patient will be educated and independent with work simplification and energy conservation techniques to utilize when performing daily activities.    Time 8   Period Weeks   Status Not Met     OT LONG TERM GOAL #3   Title Patient will improve bilateral shoulder A/ROM to 100 degrees flexion/abduction and 45 degrees rotation or better for improved ability to complete functional activities independently.    Time 8   Period Weeks   Status Not Met     OT LONG TERM GOAL #4   Title Patient will improve bilateral shoulder strength to 4/5 for improved ability to lift pots and pans when cooking.    Time 8   Period Weeks   Status Not Met     OT LONG TERM GOAL #5   Title Patient will decrease bilateral shoulder pain to 2/10 or better when completing daily tasks.    Time 8   Period Weeks   Status Not Met     OT LONG TERM GOAL #6    Title Patient will decrease bilateral shoulder fascial restrictions to minimal for improved abilty to utilize bilateral arms with functional activities.    Time 8  Period Weeks   Status Not Met               Plan - 18-Aug-2016 1548    Clinical Impression Statement A: Patient completed first treatment this session for BUE weakness. During session, patient reports that she is worried about her legs primarily and would like to only attend therapy for her legs. Therapist did educate patient that we can complete therapy on both arms and legs. Patient wishes to only focus on legs at this time. Session focused on myofascial release, manual stretching and AA/ROM for bilateral shoulder. Patient was given external rotation stretch to complete with bialteral shoulders as she reports that her biggest deficit is being unable to reach behind her head to do her hair.    Plan P: Per patient wishes: D/C from OT services with HEP.       Patient will benefit from skilled therapeutic intervention in order to improve the following deficits and impairments:  Cardiopulmonary status limiting activity, Decreased range of motion, Decreased strength, Increased fascial restricitons, Impaired UE functional use, Increased muscle spasms, Pain  Visit Diagnosis: Acute pain of left shoulder  Acute pain of right shoulder  Stiffness of left shoulder, not elsewhere classified  Stiffness of right shoulder, not elsewhere classified  Other symptoms and signs involving the musculoskeletal system      G-Codes - 08-18-16 1552    Functional Assessment Tool Used Clinical judgement   Functional Assessment Tool Used clinical judgememt    Functional Limitation Self care   Self Care Goal Status ([id number]) At least 20 percent but less than 40 percent impaired, limited or restricted   Self Care Discharge Status [phone]) At least 60 percent but less than 80 percent impaired, limited or restricted      Problem List Patient Active  Problem List   Diagnosis Date Noted  . Generalized weakness 07/09/2016  . Loss of weight 07/03/2016  . Anemia 07/03/2016  . Weakness of both upper extremities 06/14/2016  . CHB (complete heart block) ([place])   . COPD (chronic obstructive pulmonary disease) ([place])   . Weight loss, unintentional 12/23/2015  . Anorexia 12/23/2015  . Tobacco use disorder 10/15/2015  . Back pain 12/30/2014  . OA (osteoarthritis) 12/30/2014  . Carotid stenosis 03/11/2014  . PVD (peripheral vascular disease) ([place]) 03/11/2014  . Colon cancer screening 02/04/2014  . Overactive bladder 12/31/2013  . Rash and nonspecific skin eruption 05/19/2013  . Numbness and tingling-bilat foot 03/05/2013  . Weakness of hand- bilat 03/05/2013  . IBS (irritable bowel syndrome) 12/30/2012  . Peripheral neuropathy ([place]) 07/01/2012  . Insomnia 07/01/2012  . Chronic kidney disease (CKD), stage III (moderate) 08/25/2010  . Cardiovascular disease 08/16/2009  . Cerebrovascular disease 08/16/2009  . Peripheral vascular disease ([place]) 08/16/2009  . Diabetes mellitus ([place]) 10/26/2008  . Hyperlipidemia 10/26/2008  . Gout, unspecified 10/26/2008  . Essential hypertension 10/26/2008  . Asthmatic bronchitis 10/26/2008  . Gastroesophageal reflux disease 10/26/2008    OCCUPATIONAL THERAPY DISCHARGE SUMMARY  Visits from Start of Care: 2  Current functional level related to goals / functional outcomes: Patient is currently at functioning level at evaluation. No goals have been met.   Remaining deficits: Patient continues to have deficits with bilateral shoulder ROM, strength, pain and fascial restrictions.   Education / Equipment: Pt was given an HEP for shoulder ROM. Plan: Patient agrees to discharge.  Patient goals were not met. Patient is being discharged due to the patient's request.  ?????  Ailene Ravel, OTR/L,CBIS  [phone]  07/24/2016, 3:54 PM  [place] [address] [place], [place], [address] Phone: [phone]   Fax:  [phone]  Name: Charlotte Henry MRN: [id number] Date of Birth: 1940/09/14

## 2016-07-26 ENCOUNTER — Ambulatory Visit (INDEPENDENT_AMBULATORY_CARE_PROVIDER_SITE_OTHER): Payer: Medicare HMO | Admitting: Gastroenterology

## 2016-07-26 ENCOUNTER — Encounter: Payer: Self-pay | Admitting: Gastroenterology

## 2016-07-26 DIAGNOSIS — D649 Anemia, unspecified: Secondary | ICD-10-CM | POA: Diagnosis not present

## 2016-07-26 DIAGNOSIS — K2970 Gastritis, unspecified, without bleeding: Secondary | ICD-10-CM

## 2016-07-26 DIAGNOSIS — B9681 Helicobacter pylori [H. pylori] as the cause of diseases classified elsewhere: Secondary | ICD-10-CM

## 2016-07-26 NOTE — Progress Notes (Signed)
APPT MADE

## 2016-07-26 NOTE — Progress Notes (Signed)
cc'ed to pcp °

## 2016-07-26 NOTE — Assessment & Plan Note (Addendum)
NO BRBPR OR MELENA.Treated FO RH PYLORI in the past but failed to eradicate. PT TREATED WITH BIAXIN AND FLAGYL AJN 2018. DID NOT TOLERATE ABX. HAD NAUSEA/VOMITING, AND WEAKNESS TO THE POINT THAT SHE COULD NOT WALK. MEDS STOPPED AND PT HAD ?2 DAYS LEFT.  WEAKNESS IS IMPROVED BUT NOT RESOLVED. PT LIKELY DEVELOP PERIPHERAL NEUROPATHY FROM FLAGYL ALSO NOTED TO HAVE LOW Hb BT NO BRBPR/MELENA. LOW Hb LIKELY DUE TO CHRONIC ILLNESS. PT WITH POOR PO INTAKE AND STRESSED FOR 6 WEEKS. PT TAKING PO IRON AND C/O CONSTIPATION BUT OTC AND Rx MEDS CAUSE DIARRHEA. BIAXIN AND FLAGYL LISTED AMONG PTS DRUG ALLERGIES. TSH UTD JAN 2018.  PT NEED VITAMIN B12 CHECKED. PT DECLINED ENDOSCOPY AT THIS TIME AND REFERRAL TO NEUROLOGY.  CONTINUE PHYSICAL THERAPY. CONTINUE IRON.  FOLLOW UP MAR 22 '@1130'$ . I WILL RECHECK BLOOD COUNT AFTER NEXT VISIT. CONSIDER EGD/TCS IF ANEMIA NOT IMPROVED. PLAN TO RECHECK H PYLORI BREATH TEST IN JUL 2018.

## 2016-07-26 NOTE — Progress Notes (Signed)
Subjective:    Patient ID: Charlotte Henry, female    DOB: 23-Nov-1940, 76 y.o.   MRN: [id number]  [name], MD  HPI Took ABX AND had side effects. Been down for past. DR. Buelah Manis FOUND SHE WAS DEHYDRATED AND WOULDN'T EAT. Drinking ensure. Major problem now is weakness and no energy. LEGS WON'T HARDLY MOVE. WAS UNABLE TO WALK THEN ON A WALKER,  AND NOW ON A CANE. STRENGTH SLOWLY COMING BACK  BUT NOT BACK TO [place]. PYLERA MADE HER SICK AND THROWING UP. DID NOT FINISH IT BUT MISSED ONLY 2 DAYS. ONLY NEW MEDS TO TAKE WIND OUT OF HER SAILS: FLAGYL/BIAXIN. STARTED TO PHYSICAL THERAPY 1-2 WEEKS AGO.  ATE YESTERDAY: SAUSAGE BISCUIT/COFFEE, MEATLOAF/CREAMED POTATOES. APPETITE COMING BACK BUT SLOW. ENSURE TID. PT C/O CONSTIPATED. DID AN ENEMA ON SUN. HAS AMITIZA BUT SCARED TO TAKE IT BECAUSE IT WORKS TOO WELL. MIRALAX AND LINZESS ALSO CAUSE RAGING DIARRHEA. OCCASIONAL NAUSEA. NO VOMITING FOR PAST 2 WEEKS. STAYS COLD. SAW ONE TIME: STREAKS BLOOD IN VOMIT.   PT DENIES FEVER, CHILLS, HEMATOCHEZIA, HEMATEMESIS, vomiting, melena, diarrhea, CHEST PAIN, SHORTNESS OF BREATH, CHANGE IN BOWEL IN HABITS, abdominal pain, problems swallowing, OR heartburn or indigestion.  Past Medical History:  Diagnosis Date  . Arterial fibromuscular dysplasia ([place])   . ASCVD (arteriosclerotic cardiovascular disease)    a. cath in 4/04,-80%LAD; 70% RCA, -> DESx2; residual 60% distal RCA; nl EF  b. Nuc 02/2016 aborted due to bradycardia. Will need to be rescheduled   . Carotid artery occlusion   . Cerebrovascular disease   . CHB (complete heart block) ([place])    a. s/p PPM on 03/24/16 with a St. Jude (serial number [id number]) pacemaker  . Chronic kidney disease (CKD), stage III (moderate)   . Clostridium difficile colitis 03/2013   a. 03/2013  . COPD (chronic obstructive pulmonary disease) ([place])   . Coronary disease   . Diabetes mellitus   . Diverticulosis   . GERD (gastroesophageal reflux disease)   . Gout   . H pylori  ulcer   . Hyperlipidemia   . Hypertension   . IBS (irritable bowel syndrome)   . PVD (peripheral vascular disease) ([place])    a. moderate to severely decreased ABI's in 8/08, sig. aortic inflow disease b. repeat ABI's in 2011 improved- mild disease on the left and moderate to severe disease on the right  . S/P placement of cardiac pacemaker    a. 03/24/16: St. Jude (serial number [id number]) pacemaker  . TIA (transient ischemic attack) 2012  . Tobacco abuse    a. 50 pack years continuing at 1/2 pack per day    Past Surgical History:  Procedure Laterality Date  . ABDOMINAL HYSTERECTOMY    . carpel tunnel release     bilateral  . CHOLECYSTECTOMY    . COLONOSCOPY  2009  . EP IMPLANTABLE DEVICE N/A 03/24/2016   Procedure: Pacemaker Implant;  Surgeon: [name], MD;  Location: [place] [facility];  Service: Cardiovascular;  Laterality: N/A;  . TOTAL ABDOMINAL HYSTERECTOMY W/ BILATERAL SALPINGOOPHORECTOMY     Allergies  Allergen Reactions  . Ace Inhibitors Hives, Shortness Of Breath and Swelling  . Aspirin Other (See Comments)    Blood in stool and nose bleed  . Codeine Swelling  . Pineapple Shortness Of Breath and Swelling  . Plasticized Base [Plastibase] Hives and Swelling    No plastics  . Ultram [Tramadol] Swelling  . Adhesive [Tape] Swelling and Rash  . Naprosyn [Naproxen] Swelling  .  Biaxin [Clarithromycin] Nausea And Vomiting and Other (See Comments)    Dizziness.  . Clindamycin/Lincomycin Other (See Comments)    Burning sensation throat, abdomen  . Linzess [Linaclotide]     DIARRHEA, FECAL INCONTINENCE  . Metronidazole     NAUSEA AND WEAKNESS  . Nsaids   . Penicillins       . Varenicline Tartrate Swelling    Current Outpatient Prescriptions  Medication Sig Dispense Refill  . acetaminophen (TYLENOL) 500 MG tablet Take 500 mg by mouth every 6 (six) hours as needed for mild pain or moderate pain.    Marland Kitchen albuterol (PROVENTIL HFA;VENTOLIN HFA) 108 (90 Base) MCG/ACT  inhaler Inhale 2 puffs into the lungs every 4 (four) hours as needed for wheezing or shortness of breath.    . ALPRAZolam (XANAX) 0.25 MG tablet Take 1 tablet (0.25 mg total) by mouth at bedtime as needed.    . calcium citrate-vitamin D (CITRACAL+D) 315-200 MG-UNIT per tablet Take 1 tablet by mouth daily.      . Cranberry 250 MG CAPS Take 250 mg by mouth daily.    . Cyanocobalamin (VITAMIN B 12 PO) Take 1 tablet by mouth daily.     Marland Kitchen esomeprazole (NEXIUM) 40 MG capsule Take 1 capsule (40 mg total) by mouth 2 (two) times daily before a meal. FOR 14 DAYS    . Fluticasone-Salmeterol (ADVAIR) 100-50 MCG/DOSE AEPB Inhale 1 puff into the lungs 2 (two) times daily. (Patient taking differently: Inhale 1 puff into the lungs 2 (two) times daily as needed (Shortness of breath). )    . gabapentin (NEURONTIN) 100 MG capsule Take 1-2 capsules at bedtime for nerve pain (Patient taking differently: Take 100 mg by mouth at bedtime. Take 1-2 capsules at bedtime for nerve pain)    . mirabegron ER (MYRBETRIQ) 25 MG TB24 tablet Take 1 tablet (25 mg total) by mouth at bedtime. For bladder PRN   . Probiotic Product (PROBIOTIC DAILY PO) Take 1 capsule by mouth daily.     . promethazine 12.5 MG tablet 1-2 po 30 MINS PRIOR TO MEDS OR MEALS. MAY REPEAT EVERY 4-6 h prn nausea or vomiting LAST NIGHT   . rivaroxaban (XARELTO) 20 MG TABS tablet Take 1 tablet (20 mg total) by mouth daily.    . Tiotropium Bromide Monohydrate (SPIRIVA RESPIMAT) 1.25 MCG/ACT AERS Inhale 2 puffs into the lungs daily.    .      . ZETIA 10 MG tablet TAKE 1 TABLET BY MOUTH ONCE A DAY.    Marland Kitchen       Review of Systems PER HPI OTHERWISE ALL SYSTEMS ARE NEGATIVE.    Objective:   Physical Exam  Constitutional: She is oriented to person, place, and time. She appears well-developed and well-nourished. No distress.  HENT:  Head: Normocephalic and atraumatic.  Mouth/Throat: Oropharynx is clear and moist. No oropharyngeal exudate.  Eyes: Pupils are equal,  round, and reactive to light. No scleral icterus.  Neck: Normal range of motion. Neck supple.  Cardiovascular: Normal rate, regular rhythm and normal heart sounds.   Pulmonary/Chest: Effort normal and breath sounds normal. No respiratory distress.  Abdominal: Soft. Bowel sounds are normal. She exhibits no distension. There is no tenderness.  Musculoskeletal: She exhibits no edema.  Lymphadenopathy:    She has no cervical adenopathy.  Neurological: She is alert and oriented to person, place, and time.  ABLE TO GET ONE EXAM TABLE WITHOUT ASSISTANCE to ambulate with assistance and get on exam table without assistance. WALKS ASSISTED WITH  A CANE. SLOW BUT STEADY GAIT.  Psychiatric:  SLIGHTLY DEPRESSED MOOD, FLAT AFFECT INITIALLY BUT NL AFTER INTERACTION   Vitals reviewed.     Assessment & Plan:

## 2016-07-26 NOTE — Patient Instructions (Addendum)
CONTINUE PHYSICAL THERAPY.  FOLLOW UP MAR 22 '@1130'$ . I WILL RECHECK BLOOD COUNT AFTER NEXT VISIT.  CONTINUE AMITIZA. USE ONCE DAILY FOR 2 DAYS THEN HOLD FOR 2 DAYS THEN TAKE FOR 2 DAYS TO ADDRESS CONSTIPATION.  CONTINUE IRON.

## 2016-07-26 NOTE — Assessment & Plan Note (Signed)
Treated with PYLERA?? in the past but failed to eradicate. PT TREATED WITH BIAXIN AND FLAGYL. DID NOT TOLERATE ABX. HAD NAUSEA/VOMITING, AND WEAKNESS TO THE POINT THAT SHE COULD NOT WALK. MEDS STOPPED AND PT HAD ?2 DAYS LEFT.  WEAKNESS IS IMPROVED BUT NOT RESOLVED. PT LIKELY DEVELOP PERIPHERAL NEUROPATHY FROM FLAGYL ALSO NOTED TO HAVE LOW Hb BT NO BRBPR/MELENA. LOW Hb LIKELY DUE TO CHRONIC ILLNESS. PT WITH POOR PO INTAKE AND STRESSED FOR 6 WEEKS. PT TAKING PO IRON AND C/O CONSTIPATION BUT OTC AND Rx MEDS CAUSE DIARRHEA. BIAXIN AND FLAGYL LISTED AMONG PTS DRUG ALLERGIES. PT DECLINED ENDOSCOPY AT THIS TIME.   CONTINUE PHYSICAL THERAPY. CONTINUE AMITIZA. USE ONCE DAILY FOR 2 DAYS THEN HOLD FOR 2 DAYS THEN TAKE FOR 2 DAYS TO ADDRESS CONSTIPATION. CONTINUE IRON.  FOLLOW UP MAR 22 '@1130'$ . I WILL RECHECK BLOOD COUNT AFTER NEXT VISIT. CONSIDER EGD/TCS IF ANEMIA NOT IMPROVED. PLAN TO RECHECK H PYLORI BREATH TEST IN JUL 2018.   GREATER THAN 50% WAS SPENT IN COUNSELING & COORDINATION OF CARE WITH THE PATIENT: DISCUSSED DIFFERENTIAL DIAGNOSIS, REVIEWING EMR FROM DEC 2017 TO PRESENT, PROCEDURE, BENEFITS, RISKS, AND MANAGEMENT OF ANEMIA/H PYLORI. TOTAL ENCOUNTER TIME: 53 MINS.

## 2016-07-27 NOTE — Progress Notes (Signed)
APPT MADE AND PATIENT AWARE °

## 2016-07-27 NOTE — Progress Notes (Signed)
cc'ed to pcp °

## 2016-07-28 ENCOUNTER — Ambulatory Visit (HOSPITAL_COMMUNITY): Payer: Medicare HMO

## 2016-07-31 ENCOUNTER — Ambulatory Visit (HOSPITAL_COMMUNITY): Payer: Medicare HMO | Admitting: Specialist

## 2016-07-31 ENCOUNTER — Other Ambulatory Visit: Payer: Self-pay | Admitting: Gastroenterology

## 2016-08-02 ENCOUNTER — Encounter (HOSPITAL_COMMUNITY): Payer: Self-pay | Admitting: Emergency Medicine

## 2016-08-02 ENCOUNTER — Inpatient Hospital Stay (HOSPITAL_COMMUNITY)
Admission: EM | Admit: 2016-08-02 | Discharge: 2016-08-05 | DRG: 811 | Disposition: A | Discharge status: Home / Self Care | Payer: Medicare HMO | Attending: Family Medicine | Admitting: Family Medicine

## 2016-08-02 ENCOUNTER — Telehealth (HOSPITAL_COMMUNITY): Payer: Self-pay | Admitting: Family Medicine

## 2016-08-02 ENCOUNTER — Emergency Department (HOSPITAL_COMMUNITY): Payer: Medicare HMO

## 2016-08-02 ENCOUNTER — Encounter: Payer: Self-pay | Admitting: Family Medicine

## 2016-08-02 ENCOUNTER — Ambulatory Visit (HOSPITAL_COMMUNITY): Payer: Medicare HMO | Admitting: Physical Therapy

## 2016-08-02 ENCOUNTER — Ambulatory Visit (INDEPENDENT_AMBULATORY_CARE_PROVIDER_SITE_OTHER): Payer: Medicare HMO | Admitting: Family Medicine

## 2016-08-02 VITALS — BP 118/72 | HR 88 | Temp 97.9°F | Resp 18 | Ht 67.0 in | Wt 137.2 lb

## 2016-08-02 DIAGNOSIS — F17210 Nicotine dependence, cigarettes, uncomplicated: Secondary | ICD-10-CM | POA: Diagnosis present

## 2016-08-02 DIAGNOSIS — E1140 Type 2 diabetes mellitus with diabetic neuropathy, unspecified: Secondary | ICD-10-CM

## 2016-08-02 DIAGNOSIS — R634 Abnormal weight loss: Secondary | ICD-10-CM | POA: Diagnosis present

## 2016-08-02 DIAGNOSIS — Z8711 Personal history of peptic ulcer disease: Secondary | ICD-10-CM | POA: Diagnosis not present

## 2016-08-02 DIAGNOSIS — B9681 Helicobacter pylori [H. pylori] as the cause of diseases classified elsewhere: Secondary | ICD-10-CM | POA: Diagnosis not present

## 2016-08-02 DIAGNOSIS — Z8249 Family history of ischemic heart disease and other diseases of the circulatory system: Secondary | ICD-10-CM

## 2016-08-02 DIAGNOSIS — K2970 Gastritis, unspecified, without bleeding: Secondary | ICD-10-CM | POA: Diagnosis not present

## 2016-08-02 DIAGNOSIS — E1122 Type 2 diabetes mellitus with diabetic chronic kidney disease: Secondary | ICD-10-CM | POA: Diagnosis present

## 2016-08-02 DIAGNOSIS — Z6821 Body mass index (BMI) 21.0-21.9, adult: Secondary | ICD-10-CM

## 2016-08-02 DIAGNOSIS — M109 Gout, unspecified: Secondary | ICD-10-CM | POA: Diagnosis present

## 2016-08-02 DIAGNOSIS — N183 Chronic kidney disease, stage 3 unspecified: Secondary | ICD-10-CM

## 2016-08-02 DIAGNOSIS — Q438 Other specified congenital malformations of intestine: Secondary | ICD-10-CM

## 2016-08-02 DIAGNOSIS — R630 Anorexia: Secondary | ICD-10-CM

## 2016-08-02 DIAGNOSIS — E46 Unspecified protein-calorie malnutrition: Secondary | ICD-10-CM | POA: Insufficient documentation

## 2016-08-02 DIAGNOSIS — Z86718 Personal history of other venous thrombosis and embolism: Secondary | ICD-10-CM

## 2016-08-02 DIAGNOSIS — E1151 Type 2 diabetes mellitus with diabetic peripheral angiopathy without gangrene: Secondary | ICD-10-CM | POA: Diagnosis not present

## 2016-08-02 DIAGNOSIS — R531 Weakness: Secondary | ICD-10-CM | POA: Diagnosis not present

## 2016-08-02 DIAGNOSIS — Z716 Tobacco abuse counseling: Secondary | ICD-10-CM

## 2016-08-02 DIAGNOSIS — K639 Disease of intestine, unspecified: Secondary | ICD-10-CM | POA: Diagnosis not present

## 2016-08-02 DIAGNOSIS — I2510 Atherosclerotic heart disease of native coronary artery without angina pectoris: Secondary | ICD-10-CM | POA: Diagnosis present

## 2016-08-02 DIAGNOSIS — I10 Essential (primary) hypertension: Secondary | ICD-10-CM | POA: Diagnosis present

## 2016-08-02 DIAGNOSIS — Z950 Presence of cardiac pacemaker: Secondary | ICD-10-CM | POA: Diagnosis present

## 2016-08-02 DIAGNOSIS — Z809 Family history of malignant neoplasm, unspecified: Secondary | ICD-10-CM | POA: Diagnosis not present

## 2016-08-02 DIAGNOSIS — Z90710 Acquired absence of both cervix and uterus: Secondary | ICD-10-CM

## 2016-08-02 DIAGNOSIS — Z888 Allergy status to other drugs, medicaments and biological substances status: Secondary | ICD-10-CM

## 2016-08-02 DIAGNOSIS — N189 Chronic kidney disease, unspecified: Secondary | ICD-10-CM | POA: Diagnosis present

## 2016-08-02 DIAGNOSIS — D509 Iron deficiency anemia, unspecified: Secondary | ICD-10-CM | POA: Diagnosis present

## 2016-08-02 DIAGNOSIS — K219 Gastro-esophageal reflux disease without esophagitis: Secondary | ICD-10-CM | POA: Diagnosis not present

## 2016-08-02 DIAGNOSIS — Z90722 Acquired absence of ovaries, bilateral: Secondary | ICD-10-CM

## 2016-08-02 DIAGNOSIS — K210 Gastro-esophageal reflux disease with esophagitis: Secondary | ICD-10-CM | POA: Diagnosis present

## 2016-08-02 DIAGNOSIS — K222 Esophageal obstruction: Secondary | ICD-10-CM | POA: Diagnosis not present

## 2016-08-02 DIAGNOSIS — F17200 Nicotine dependence, unspecified, uncomplicated: Secondary | ICD-10-CM | POA: Diagnosis not present

## 2016-08-02 DIAGNOSIS — I959 Hypotension, unspecified: Secondary | ICD-10-CM | POA: Diagnosis present

## 2016-08-02 DIAGNOSIS — K922 Gastrointestinal hemorrhage, unspecified: Secondary | ICD-10-CM | POA: Diagnosis not present

## 2016-08-02 DIAGNOSIS — I700 Atherosclerosis of aorta: Secondary | ICD-10-CM | POA: Diagnosis not present

## 2016-08-02 DIAGNOSIS — K644 Residual hemorrhoidal skin tags: Secondary | ICD-10-CM | POA: Diagnosis present

## 2016-08-02 DIAGNOSIS — E43 Unspecified severe protein-calorie malnutrition: Secondary | ICD-10-CM | POA: Diagnosis not present

## 2016-08-02 DIAGNOSIS — J441 Chronic obstructive pulmonary disease with (acute) exacerbation: Secondary | ICD-10-CM | POA: Diagnosis not present

## 2016-08-02 DIAGNOSIS — D649 Anemia, unspecified: Secondary | ICD-10-CM | POA: Diagnosis not present

## 2016-08-02 DIAGNOSIS — Z833 Family history of diabetes mellitus: Secondary | ICD-10-CM

## 2016-08-02 DIAGNOSIS — J449 Chronic obstructive pulmonary disease, unspecified: Secondary | ICD-10-CM | POA: Diagnosis not present

## 2016-08-02 DIAGNOSIS — Z880 Allergy status to penicillin: Secondary | ICD-10-CM

## 2016-08-02 DIAGNOSIS — R0602 Shortness of breath: Secondary | ICD-10-CM | POA: Diagnosis not present

## 2016-08-02 DIAGNOSIS — K2950 Unspecified chronic gastritis without bleeding: Secondary | ICD-10-CM | POA: Diagnosis not present

## 2016-08-02 DIAGNOSIS — Z8673 Personal history of transient ischemic attack (TIA), and cerebral infarction without residual deficits: Secondary | ICD-10-CM | POA: Diagnosis not present

## 2016-08-02 DIAGNOSIS — I129 Hypertensive chronic kidney disease with stage 1 through stage 4 chronic kidney disease, or unspecified chronic kidney disease: Secondary | ICD-10-CM | POA: Diagnosis present

## 2016-08-02 DIAGNOSIS — Z9049 Acquired absence of other specified parts of digestive tract: Secondary | ICD-10-CM

## 2016-08-02 DIAGNOSIS — Z7901 Long term (current) use of anticoagulants: Secondary | ICD-10-CM

## 2016-08-02 DIAGNOSIS — Z7951 Long term (current) use of inhaled steroids: Secondary | ICD-10-CM

## 2016-08-02 DIAGNOSIS — Z79899 Other long term (current) drug therapy: Secondary | ICD-10-CM

## 2016-08-02 DIAGNOSIS — E119 Type 2 diabetes mellitus without complications: Secondary | ICD-10-CM

## 2016-08-02 DIAGNOSIS — D500 Iron deficiency anemia secondary to blood loss (chronic): Principal | ICD-10-CM | POA: Diagnosis present

## 2016-08-02 DIAGNOSIS — Z8619 Personal history of other infectious and parasitic diseases: Secondary | ICD-10-CM

## 2016-08-02 DIAGNOSIS — E1159 Type 2 diabetes mellitus with other circulatory complications: Secondary | ICD-10-CM | POA: Diagnosis not present

## 2016-08-02 DIAGNOSIS — K921 Melena: Secondary | ICD-10-CM | POA: Diagnosis not present

## 2016-08-02 DIAGNOSIS — K449 Diaphragmatic hernia without obstruction or gangrene: Secondary | ICD-10-CM | POA: Diagnosis present

## 2016-08-02 DIAGNOSIS — D72829 Elevated white blood cell count, unspecified: Secondary | ICD-10-CM | POA: Diagnosis present

## 2016-08-02 DIAGNOSIS — Z811 Family history of alcohol abuse and dependence: Secondary | ICD-10-CM

## 2016-08-02 LAB — COMPREHENSIVE METABOLIC PANEL
ALT: 16 U/L (ref 14–54)
AST: 24 U/L (ref 15–41)
Albumin: 3.6 g/dL (ref 3.5–5.0)
Alkaline Phosphatase: 33 U/L — ABNORMAL LOW (ref 38–126)
Anion gap: 6 (ref 5–15)
BUN: 54 mg/dL — AB (ref 6–20)
CHLORIDE: 106 mmol/L (ref 101–111)
CO2: 26 mmol/L (ref 22–32)
CREATININE: 1.2 mg/dL — AB (ref 0.44–1.00)
Calcium: 9.4 mg/dL (ref 8.9–10.3)
GFR calc Af Amer: 49 mL/min — ABNORMAL LOW (ref >60)
GFR calc non Af Amer: 42 mL/min — ABNORMAL LOW (ref >60)
Glucose, Bld: 133 mg/dL — ABNORMAL HIGH (ref 65–99)
Potassium: 4.4 mmol/L (ref 3.5–5.1)
SODIUM: 138 mmol/L (ref 135–145)
Total Bilirubin: 0.3 mg/dL (ref 0.3–1.2)
Total Protein: 6.8 g/dL (ref 6.5–8.1)

## 2016-08-02 LAB — URINALYSIS, ROUTINE W REFLEX MICROSCOPIC
BILIRUBIN URINE: NEGATIVE
Glucose, UA: NEGATIVE mg/dL
Hgb urine dipstick: NEGATIVE
Ketones, ur: NEGATIVE mg/dL
Leukocytes, UA: NEGATIVE
NITRITE: NEGATIVE
Protein, ur: NEGATIVE mg/dL
SPECIFIC GRAVITY, URINE: 1.018 (ref 1.005–1.030)
pH: 5 (ref 5.0–8.0)

## 2016-08-02 LAB — TROPONIN I

## 2016-08-02 LAB — CBC WITH DIFFERENTIAL/PLATELET
BASOS PCT: 0 %
Basophils Absolute: 0 10*3/uL (ref 0.0–0.1)
Eosinophils Absolute: 0.1 10*3/uL (ref 0.0–0.7)
Eosinophils Relative: 1 %
HEMATOCRIT: 17.5 % — AB (ref 36.0–46.0)
HEMOGLOBIN: 5.9 g/dL — AB (ref 12.0–15.0)
LYMPHS ABS: 2.9 10*3/uL (ref 0.7–4.0)
Lymphocytes Relative: 27 %
MCH: 30.3 pg (ref 26.0–34.0)
MCHC: 33.7 g/dL (ref 30.0–36.0)
MCV: 89.7 fL (ref 78.0–100.0)
MONOS PCT: 6 %
Monocytes Absolute: 0.6 10*3/uL (ref 0.1–1.0)
NEUTROS ABS: 7.4 10*3/uL (ref 1.7–7.7)
NEUTROS PCT: 67 %
Platelets: 246 10*3/uL (ref 150–400)
RBC: 1.95 MIL/uL — ABNORMAL LOW (ref 3.87–5.11)
RDW: 16.3 % — ABNORMAL HIGH (ref 11.5–15.5)
WBC: 11.1 10*3/uL — ABNORMAL HIGH (ref 4.0–10.5)

## 2016-08-02 LAB — PREPARE RBC (CROSSMATCH)

## 2016-08-02 LAB — LACTIC ACID, PLASMA: Lactic Acid, Venous: 1 mmol/L (ref 0.5–1.9)

## 2016-08-02 LAB — LIPASE, BLOOD: Lipase: 10 U/L — ABNORMAL LOW (ref 11–51)

## 2016-08-02 LAB — ABO/RH: ABO/RH(D): O POS

## 2016-08-02 LAB — TSH: TSH: 1.718 u[IU]/mL (ref 0.350–4.500)

## 2016-08-02 MED ORDER — ALPRAZOLAM 0.25 MG PO TABS: 0.2500 mg | ORAL_TABLET | Freq: Every evening | ORAL | Status: DC | PRN

## 2016-08-02 MED ORDER — ACETAMINOPHEN 650 MG RE SUPP: 650.0000 mg | Freq: Four times a day (QID) | RECTAL | Status: DC | PRN

## 2016-08-02 MED ORDER — SODIUM CHLORIDE 0.9 % IV SOLN
Freq: Once | INTRAVENOUS | Status: AC
  Administered 2016-08-02: 21:00:00 via INTRAVENOUS

## 2016-08-02 MED ORDER — ALBUTEROL SULFATE (2.5 MG/3ML) 0.083% IN NEBU: 3.0000 mL | INHALATION_SOLUTION | RESPIRATORY_TRACT | Status: DC | PRN

## 2016-08-02 MED ORDER — PANTOPRAZOLE SODIUM 40 MG IV SOLR
40.0000 mg | Freq: Once | INTRAVENOUS | Status: AC
  Administered 2016-08-02: 40 mg via INTRAVENOUS
  Filled 2016-08-02: qty 40

## 2016-08-02 MED ORDER — ONDANSETRON HCL 4 MG PO TABS: 4.0000 mg | ORAL_TABLET | Freq: Four times a day (QID) | ORAL | Status: DC | PRN

## 2016-08-02 MED ORDER — ONDANSETRON HCL 4 MG/2ML IJ SOLN
4.0000 mg | Freq: Four times a day (QID) | INTRAMUSCULAR | Status: DC | PRN
  Administered 2016-08-03 (×2): 4 mg via INTRAVENOUS
  Filled 2016-08-02 (×2): qty 2

## 2016-08-02 MED ORDER — SODIUM CHLORIDE 0.9 % IV SOLN
INTRAVENOUS | Status: DC
  Administered 2016-08-02 – 2016-08-03 (×3): via INTRAVENOUS

## 2016-08-02 MED ORDER — PRAVASTATIN SODIUM 10 MG PO TABS
20.0000 mg | ORAL_TABLET | Freq: Every day | ORAL | Status: DC
  Administered 2016-08-04: 20 mg via ORAL
  Filled 2016-08-02: qty 2

## 2016-08-02 MED ORDER — SODIUM CHLORIDE 0.9 % IV SOLN
10.0000 mL/h | Freq: Once | INTRAVENOUS | Status: AC
  Administered 2016-08-02: 10 mL/h via INTRAVENOUS

## 2016-08-02 MED ORDER — EZETIMIBE 10 MG PO TABS
10.0000 mg | ORAL_TABLET | Freq: Every day | ORAL | Status: DC
  Administered 2016-08-04: 10 mg via ORAL
  Filled 2016-08-02 (×3): qty 1

## 2016-08-02 MED ORDER — TIOTROPIUM BROMIDE MONOHYDRATE 18 MCG IN CAPS
1.0000 | ORAL_CAPSULE | Freq: Every day | RESPIRATORY_TRACT | Status: DC
  Administered 2016-08-03 – 2016-08-05 (×3): 18 ug via RESPIRATORY_TRACT
  Filled 2016-08-02: qty 5

## 2016-08-02 MED ORDER — GABAPENTIN 100 MG PO CAPS
100.0000 mg | ORAL_CAPSULE | Freq: Every day | ORAL | Status: DC
  Administered 2016-08-02 – 2016-08-04 (×3): 100 mg via ORAL
  Filled 2016-08-02 (×3): qty 1

## 2016-08-02 MED ORDER — ACETAMINOPHEN 325 MG PO TABS: 650.0000 mg | ORAL_TABLET | Freq: Four times a day (QID) | ORAL | Status: DC | PRN

## 2016-08-02 MED ORDER — PANTOPRAZOLE SODIUM 40 MG IV SOLR
40.0000 mg | Freq: Two times a day (BID) | INTRAVENOUS | Status: DC
  Administered 2016-08-02 – 2016-08-04 (×4): 40 mg via INTRAVENOUS
  Filled 2016-08-02 (×4): qty 40

## 2016-08-02 MED ORDER — SODIUM CHLORIDE 0.9 % IV BOLUS (SEPSIS)
500.0000 mL | Freq: Once | INTRAVENOUS | Status: AC
  Administered 2016-08-02: 500 mL via INTRAVENOUS

## 2016-08-02 NOTE — ED Triage Notes (Signed)
Pt has been to [facility] 3 times recently for weakness, dehydration and poor appetite.  Seen Dr Buelah Manis in [facility] and was told to come to ED for possible admission.

## 2016-08-02 NOTE — Telephone Encounter (Signed)
08/02/16 gentleman left a message to [facility] today  Michela Pitcher that she wasn't feeling well

## 2016-08-02 NOTE — H&P (Signed)
History and Physical    [name]:[id number] DOB: April 02, 1941 DOA: 08/02/2016  PCP: [name], MD Consultants:  Enzo Montgomery; Lovena Le EP; Bronson Ing - cardiology Patient coming from: home - lives alone, sometimes with granddaughter; NOK: son, [phone]  Chief Complaint: weakness, anorexia  HPI: Charlotte Henry is a 76 y.o. female with medical history significant of pacemaker placement, PVD, ongoing tobacco dependence, h/o TIA, HTN, HLD, h/o peptic ulcer, GERD, DM, CAD, COPD, and CKD presenting with weakness, anorexia.  Patient has been unable to walk since January 9.  She had been on Flagyl and Biaxin.  She began vomiting and was unable to eat, nauseated all the time.  Daughter called Dr. Oneida Alar and she was told to stop it but could no longer walk.  Fainted Jan 21 and has been sick ever since.  Since then, she has lost from 152 to 137 (weight was actually 148 on 1/10 PCP visit and is 139 today).  No appetite.  No dysphagia.  Just doesn't like food.  No n/v.  Ensure is constipating her, took an enema on Monday with improvement.  But Ensure is the only thing that helps her to have the energy to walk.  +dizzy, intermittent fainting.  +wheezing/SOB - chronically.  Has not used inhalers recently.  Numbness in both arms at night when laying on them.  Has had some rashes for hands and neck.  Has refused EGD/colo in the recent past but did 3 recent negative home stool cards.   ED Course:  IVF for hypotension, transfusion of 1 unit PRBCs  Review of Systems: As per HPI; otherwise 10 point review of systems reviewed and negative.   Ambulatory Status:  Uses cane/walker  Past Medical History:  Diagnosis Date  . 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 ASCVD (arteriosclerotic cardiovascular disease)    a. cath in 4/04,-80%LAD; 70% RCA, -> DESx2; residual 60% distal RCA; nl EF  b. Nuc 02/2016 aborted due to bradycardia. Will need to be rescheduled   . Carotid artery occlusion   .  Cerebrovascular disease   . CHB (complete heart block) ([place])    a. s/p PPM on 03/24/16 with a St. Jude (serial number [id number]) pacemaker  . Chronic kidney disease (CKD), stage III (moderate)   . Clostridium difficile colitis 03/2013   a. 03/2013  . COPD (chronic obstructive pulmonary disease) ([place])   . Coronary disease   . Diabetes mellitus   . Diverticulosis   . GERD (gastroesophageal reflux disease)   . Gout   . H pylori ulcer   . Hyperlipidemia    chose to stop statin, takes Zetia  . Hypertension    not currently on medication  . IBS (irritable bowel syndrome)   . PVD (peripheral vascular disease) ([place])    a. moderate to severely decreased ABI's in 8/08, sig. aortic inflow disease b. repeat ABI's in 2011 improved- mild disease on the left and moderate to severe disease on the right  . S/P placement of cardiac pacemaker    a. 03/24/16: St. Jude (serial number [id number]) pacemaker  . TIA (transient ischemic attack) 2012  . Tobacco abuse    a. 50 pack years continuing at 1/2 pack per day    Past Surgical History:  Procedure Laterality Date  . ABDOMINAL HYSTERECTOMY    . carpel tunnel release     bilateral  . CHOLECYSTECTOMY    . COLONOSCOPY  2009  . EP IMPLANTABLE DEVICE N/A 03/24/2016   Procedure: Pacemaker Implant;  Surgeon: [name], MD;  Location: [facility];  Service: Cardiovascular;  Laterality: N/A;  . TOTAL ABDOMINAL HYSTERECTOMY W/ BILATERAL SALPINGOOPHORECTOMY      Social History   Social History  . Marital status: Widowed    Spouse name: N/A  . Number of children: N/A  . Years of education: N/A   Occupational History  . Not on file.   Social History Main Topics  . Smoking status: Current Every Day Smoker    Packs/day: 0.50    Years: 50.00    Types: Cigarettes    Start date: 02/02/1966  . Smokeless tobacco: Never Used     Comment: 1/2 pack daily  . Alcohol use No  . Drug use: No  . Sexual activity: Not Currently   Other Topics Concern   . Not on file   Social History Narrative  . No narrative on file    Allergies  Allergen Reactions  . Ace Inhibitors Hives, Shortness Of Breath and Swelling  . Aspirin Other (See Comments)    Blood in stool and nose bleed  . Codeine Swelling  . Pineapple Shortness Of Breath and Swelling  . Plasticized Base [Plastibase] Hives and Swelling    No plastics  . Ultram [Tramadol] Swelling  . Adhesive [Tape] Swelling and Rash  . Naprosyn [Naproxen] Swelling  . Biaxin [Clarithromycin] Nausea And Vomiting and Other (See Comments)    Dizziness.  . Clindamycin/Lincomycin Other (See Comments)    Burning sensation throat, abdomen  . Linzess [Linaclotide]     DIARRHEA, FECAL INCONTINENCE  . Metronidazole     NAUSEA AND WEAKNESS  . Nsaids   . Penicillins     Lip swelling, Hives Has patient had a PCN reaction causing immediate rash, facial/tongue/throat swelling, SOB or lightheadedness with hypotension:YES Has patient had a PCN reaction causing severe rash involving mucus membranes or skin necrosis: NO Has patient had a PCN reaction that required hospitalization NO Has patient had a PCN reaction occurring within the last 10 years: NO If all of the above answers are "NO", then may proceed with Cephalosporin use.  . Varenicline Tartrate Swelling    Family History  Problem Relation Age of Onset  . Liver disease Mother 14  . Hypertension Mother   . Cerebral aneurysm Father   . Aneurysm Father 36  . Cancer Sister   . Liver disease Brother   . Liver disease Brother   . Liver disease Brother   . Diabetes type II Brother   . Alcohol abuse Brother   . Cancer Sister     Prior to Admission medications   Medication Sig Start Date End Date Taking? Authorizing Provider  acetaminophen (TYLENOL) 500 MG tablet Take 500 mg by mouth every 6 (six) hours as needed for mild pain or moderate pain.    Historical Provider, MD  albuterol (PROVENTIL HFA;VENTOLIN HFA) 108 (90 Base) MCG/ACT inhaler Inhale 2  puffs into the lungs every 4 (four) hours as needed for wheezing or shortness of breath. 07/23/15   Alycia Rossetti, MD  ALPRAZolam Duanne Moron) 0.25 MG tablet Take 1 tablet (0.25 mg total) by mouth at bedtime as needed. 06/14/16   Alycia Rossetti, MD  calcium citrate-vitamin D (CITRACAL+D) 315-200 MG-UNIT per tablet Take 1 tablet by mouth daily.      Historical Provider, MD  Cranberry 250 MG CAPS Take 250 mg by mouth daily.    Historical Provider, MD  Cyanocobalamin (VITAMIN B 12 PO) Take 1 tablet by mouth daily.  Historical Provider, MD  esomeprazole (NEXIUM) 40 MG capsule Take 1 capsule (40 mg total) by mouth daily before breakfast. 08/02/16   Mahala Menghini, PA-C  Fluticasone-Salmeterol (ADVAIR) 100-50 MCG/DOSE AEPB Inhale 1 puff into the lungs 2 (two) times daily. Patient taking differently: Inhale 1 puff into the lungs 2 (two) times daily as needed (Shortness of breath).  12/15/15   Alycia Rossetti, MD  gabapentin (NEURONTIN) 100 MG capsule Take 1-2 capsules at bedtime for nerve pain Patient taking differently: Take 100 mg by mouth at bedtime. Take 1-2 capsules at bedtime for nerve pain 06/14/16   Alycia Rossetti, MD  mirabegron ER (MYRBETRIQ) 25 MG TB24 tablet Take 1 tablet (25 mg total) by mouth at bedtime. For bladder 07/23/15   Alycia Rossetti, MD  pravastatin (PRAVACHOL) 20 MG tablet TAKE ONE TABLET BY MOUTH ONCE DAILY. 07/03/16   Alycia Rossetti, MD  Probiotic Product (PROBIOTIC DAILY PO) Take 1 capsule by mouth daily.     Historical Provider, MD  promethazine (PHENERGAN) 12.5 MG tablet 1-2 po 30 MINS PRIOR TO MEDS OR MEALS. MAY REPEAT EVERY 4-6 h prn nausea or vomiting 06/23/16   Danie Binder, MD  rivaroxaban (XARELTO) 20 MG TABS tablet Take 1 tablet (20 mg total) by mouth daily. 07/12/16   [name], MD  Tiotropium Bromide Monohydrate (SPIRIVA RESPIMAT) 1.25 MCG/ACT AERS Inhale 2 puffs into the lungs daily. 06/14/16   Alycia Rossetti, MD  valsartan (DIOVAN) 160 MG tablet TAKE 1 TABLET  BY MOUTH DAILY. 05/31/16   Alycia Rossetti, MD  ZETIA 10 MG tablet TAKE 1 TABLET BY MOUTH ONCE A DAY. 02/29/16   Herminio Commons, MD    Physical Exam: Vitals:   08/02/16 1910 08/02/16 2200 08/02/16 2226 08/02/16 2250  BP: 106/61 113/64 (!) 102/55 104/61  Pulse: 63 60 64 62  Resp: '18  16 16  '$ Temp: 98.3 F (36.8 C) 97.4 F (36.3 C) 98.7 F (37.1 C) 97.8 F (36.6 C)  TempSrc: Oral Oral Oral Oral  SpO2:  97% 100% 99%  Weight:      Height:         General:  Appears calm and comfortable and is NAD; VERY stoic Eyes:  PERRL, EOMI, normal lids, iris ENT:  grossly normal hearing, lips & tongue, mmm Neck:  no LAD, masses or thyromegaly Cardiovascular:  RRR, 2/6 systolic murmur, no r/g. No LE edema.  Respiratory:  CTA bilaterally, no w/r/r. Normal respiratory effort. Abdomen:  soft, ntnd, NABS Skin:  no rash or induration seen on limited exam Musculoskeletal:  grossly normal tone BUE/BLE, good ROM, no bony abnormality Psychiatric:  grossly normal mood and affect, speech fluent and appropriate, AOx3 Neurologic:  CN 2-12 grossly intact, moves all extremities in coordinated fashion, sensation intact  Labs on Admission: I have personally reviewed following labs and imaging studies  CBC:  Recent Labs Lab 08/02/16 1520  WBC 11.1*  NEUTROABS 7.4  HGB 5.9*  HCT 17.5*  MCV 89.7  PLT 242   Basic Metabolic Panel:  Recent Labs Lab 08/02/16 1520  NA 138  K 4.4  CL 106  CO2 26  GLUCOSE 133*  BUN 54*  CREATININE 1.20*  CALCIUM 9.4   GFR: Estimated Creatinine Clearance: 38.2 mL/min (by C-G formula based on SCr of 1.2 mg/dL (H)). Liver Function Tests:  Recent Labs Lab 08/02/16 1520  AST 24  ALT 16  ALKPHOS 33*  BILITOT 0.3  PROT 6.8  ALBUMIN 3.6  Recent Labs Lab 08/02/16 1520  LIPASE 10*   No results for input(s): AMMONIA in the last 168 hours. Coagulation Profile: No results for input(s): INR, PROTIME in the last 168 hours. Cardiac Enzymes:  Recent  Labs Lab 08/02/16 1520  TROPONINI <0.03   BNP (last 3 results) No results for input(s): PROBNP in the last 8760 hours. HbA1C: No results for input(s): HGBA1C in the last 72 hours. CBG: No results for input(s): GLUCAP in the last 168 hours. Lipid Profile: No results for input(s): CHOL, HDL, LDLCALC, TRIG, CHOLHDL, LDLDIRECT in the last 72 hours. Thyroid Function Tests:  Recent Labs  08/02/16 1705  TSH 1.718   Anemia Panel: No results for input(s): VITAMINB12, FOLATE, FERRITIN, TIBC, IRON, RETICCTPCT in the last 72 hours. Urine analysis:    Component Value Date/Time   COLORURINE YELLOW 08/02/2016 1510   APPEARANCEUR HAZY (A) 08/02/2016 1510   LABSPEC 1.018 08/02/2016 1510   PHURINE 5.0 08/02/2016 1510   GLUCOSEU NEGATIVE 08/02/2016 1510   HGBUR NEGATIVE 08/02/2016 1510   BILIRUBINUR NEGATIVE 08/02/2016 1510   KETONESUR NEGATIVE 08/02/2016 1510   PROTEINUR NEGATIVE 08/02/2016 1510   UROBILINOGEN 2 (H) 12/21/2014 1455   NITRITE NEGATIVE 08/02/2016 1510   LEUKOCYTESUR NEGATIVE 08/02/2016 1510    Creatinine Clearance: Estimated Creatinine Clearance: 38.2 mL/min (by C-G formula based on SCr of 1.2 mg/dL (H)).  Sepsis Labs: '@LABRCNTIP'$ (procalcitonin:4,lacticidven:4) )No results found for this or any previous visit (from the past 240 hour(s)).   Radiological Exams on Admission: Dg Chest 2 View  Result Date: 08/02/2016 CLINICAL DATA:  Weakness, dehydration, and poor p.o. intake. EXAM: CHEST  2 VIEW COMPARISON:  06/28/2016 FINDINGS: Pacemaker remains in place with leads terminating over the right atrium and right ventricle. Cardiac silhouette is normal in size. Tortuosity and calcification are again noted involving the thoracic aorta. The patient's hand partially obscures the left lung base on the AP radiograph. No airspace consolidation, edema, pleural effusion, or pneumothorax is identified. Mild thoracic spondylosis is noted. IMPRESSION: 1. No active cardiopulmonary disease.  2. Aortic atherosclerosis. Electronically Signed   By: Logan Bores M.D.   On: 08/02/2016 16:24    EKG: Independently reviewed.  Atrial-sensed, ventricular-paced rhythm with rate 68  Assessment/Plan Principal Problem:   Symptomatic anemia Active Problems:   Diabetes mellitus (HCC)   Essential hypertension   Gastroesophageal reflux disease   Chronic kidney disease (CKD), stage III (moderate)   Tobacco use disorder   Weight loss, unintentional   Symptomatic anemia -Previously diagnosed with iron-deficiency anemia, but she only recently started taking iron -MCV is 80-100, indicative of normocytic anemia -This generally occurs in chronic disease and acute blood loss anemia -TSH 1.718 -BUN 54, normal on 2/2 -Creatinine 1.20, 1.02 on 2/2 -WBC 11.1 -Hgb 5.9, previously 9.5 on 2/2 and 9.1 on 2/6 -Patient began 1 unit PRBC transfusion, will order an additional unit with expected Hgb approximately 8 in the AM (will recheck) -Certainly, with h/o GERD and H. Pylori, ongoing gastric ulcer/gastritis is a consideration; it would be unusual, though, for her to have negative guaiac x 3 if GI blood loss was the source of the anemia -She reports a h/o iron deficiency and was started on iron after labs in 1/18; labs at that time do show a low iron, but her ferritin was quite elevated which makes iron deficiency actually unlikely -Also, she did recently start iron and her MCV is normal now but her Hgb is continuing to decrease -Overall, iron deficiency anemia as the cause seems less  likely -Will add anemia panel; LDH; haptoglobin; and SPEP -If no GI source of bleeding is found, she may benefit from hematology consultation. -The unintentional weight loss history, while possibly attributed to a benign GI cause, is concerning for underlying malignancy. -She has been on Xarelto for an apparent UE DVT (documentation is hard to decipher but appears to have been diagnosed on 10/30 or so); will hold this for  now, but 3 months is likely sufficient for this post-operative DVT following pacemaker placement  HTN -Hold Diovan for borderline low BPs and also mildly increased creatinine  CKD -Baseline creatinine is about 1, currently 1.20 -GFR remains in the usual range  Tobacco dependence -Cessation encouraged -She appears to be pre-contemplative -Declines patch -Ongoing efforts at cessation should be encouraged  DM -Glucose 133 -Denies ongoing h/o DM -A1c 5.6 in 1/18 -No further intervention at this time   DVT prophylaxis:  SCDs Code Status: Full - confirmed with patient/family Family Communication: Many family members present throughout evaluation  Disposition Plan:  Home once clinically improved Consults called: GI  Admission status: Admit - It is my clinical opinion that admission to INPATIENT is reasonable and necessary because this patient will require at least 2 midnights in the hospital to treat this condition based on the medical complexity of the problems presented.  Given the aforementioned information, the predictability of an adverse outcome is felt to be significant.     Karmen Bongo MD [facility]  If 7PM-7AM, please contact night-coverage [email or web address] Password [facility]  08/02/2016, 11:15 PM

## 2016-08-02 NOTE — Patient Instructions (Signed)
Pt to go to [facility]

## 2016-08-02 NOTE — ED Notes (Signed)
CRITICAL VALUE ALERT  Critical value received:  hgb 5.9  Date of notification:  08/02/16  Time of notification:  1620  Critical value read back:yes   Nurse who received alert:  Tilden Fossa RN  MD notified (1st page):  Thurnell Garbe  Time of first page:  1621  MD notified (2nd page):  Time of second page:  Responding MD:  Thurnell Garbe  Time MD responded:  [phone]

## 2016-08-02 NOTE — Progress Notes (Signed)
Lab attempted lactic acid on patient, patient is refusing IV sticks.

## 2016-08-02 NOTE — ED Notes (Signed)
Pt unable to provide urine sample at this time 

## 2016-08-02 NOTE — Assessment & Plan Note (Signed)
She has significant weakness which is progressing because of her anorexia and weight loss. Her weight is down 12 pounds in 3 weeks  an elderly female this is very dangerous. She also has underlying anemia which is also been decreasing with her last hemoglobin right at 9.1. She does have some family members that help her during the day but she is getting progressively weaker. I think she needs to be omitted to the hospital. Unclear cause of the shortness of breath and less suspicious with her generalized weakness she did not have any wheezing or not note any fluid on exam she does not have any fever. She is unsafe to stay at home at this time.  Pt to go to ER, she preferred to have family friend drive her.

## 2016-08-02 NOTE — ED Provider Notes (Signed)
[facility] DEPT Provider Note   CSN: [id number] Arrival date & time: 08/02/16  1248     History   Chief Complaint Chief Complaint  Patient presents with  . Dehydration    HPI Charlotte Henry is a 76 y.o. female.  HPI  Pt was seen at 1500. Per pt and her family, c/o gradual onset and worsening of persistent generalized weakness for the past 2 months, worse over the past 3 days. Has been associated with poor PO intake. Pt states her symptoms "started when I was taking the anitbiotics for H.Pylori." Pt states she was told to stop the antibiotics. Pt's symptoms have not improved. Pt was evaluated by her PMD today, and told to come to the ED for "admission." Denies abd pain, no vomiting/diarrhea, no back pain, no CP/SOB, no fevers, no focal motor weakness.   Past Medical History:  Diagnosis Date  . 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 ASCVD (arteriosclerotic cardiovascular disease)    a. cath in 4/04,-80%LAD; 70% RCA, -> DESx2; residual 60% distal RCA; nl EF  b. Nuc 02/2016 aborted due to bradycardia. Will need to be rescheduled   . Carotid artery occlusion   . Cerebrovascular disease   . CHB (complete heart block) ([place])    a. s/p PPM on 03/24/16 with a St. Jude (serial number [id number]) pacemaker  . Chronic kidney disease (CKD), stage III (moderate)   . Clostridium difficile colitis 03/2013   a. 03/2013  . COPD (chronic obstructive pulmonary disease) ([place])   . Coronary disease   . Diabetes mellitus   . Diverticulosis   . GERD (gastroesophageal reflux disease)   . Gout   . H pylori ulcer   . Hyperlipidemia   . Hypertension   . IBS (irritable bowel syndrome)   . PVD (peripheral vascular disease) ([place])    a. moderate to severely decreased ABI's in 8/08, sig. aortic inflow disease b. repeat ABI's in 2011 improved- mild disease on the left and moderate to severe disease on the right  . S/P placement of cardiac pacemaker    a. 03/24/16: St. Jude (serial number [id number])  pacemaker  . TIA (transient ischemic attack) 2012  . Tobacco abuse    a. 50 pack years continuing at 1/2 pack per day    Patient Active Problem List   Diagnosis Date Noted  . Helicobacter pylori gastritis 07/26/2016  . Generalized weakness 07/09/2016  . Loss of weight 07/03/2016  . Normocytic anemia 07/03/2016  . CHB (complete heart block) ([place])   . COPD (chronic obstructive pulmonary disease) ([place])   . Weight loss, unintentional 12/23/2015  . Tobacco use disorder 10/15/2015  . Back pain 12/30/2014  . OA (osteoarthritis) 12/30/2014  . Carotid stenosis 03/11/2014  . PVD (peripheral vascular disease) ([place]) 03/11/2014  . Colon cancer screening 02/04/2014  . Overactive bladder 12/31/2013  . Rash and nonspecific skin eruption 05/19/2013  . Numbness and tingling-bilat foot 03/05/2013  . Weakness of hand- bilat 03/05/2013  . IBS (irritable bowel syndrome) 12/30/2012  . Peripheral neuropathy ([place]) 07/01/2012  . Insomnia 07/01/2012  . Chronic kidney disease (CKD), stage III (moderate) 08/25/2010  . Cardiovascular disease 08/16/2009  . Cerebrovascular disease 08/16/2009  . Peripheral vascular disease ([place]) 08/16/2009  . Diabetes mellitus ([place]) 10/26/2008  . Hyperlipidemia 10/26/2008  . Gout, unspecified 10/26/2008  . Essential hypertension 10/26/2008  . Asthmatic bronchitis 10/26/2008  . Gastroesophageal reflux disease 10/26/2008    Past Surgical History:  Procedure Laterality Date  . ABDOMINAL HYSTERECTOMY    .  carpel tunnel release     bilateral  . CHOLECYSTECTOMY    . COLONOSCOPY  2009  . EP IMPLANTABLE DEVICE N/A 03/24/2016   Procedure: Pacemaker Implant;  Surgeon: [name], MD;  Location: [facility];  Service: Cardiovascular;  Laterality: N/A;  . TOTAL ABDOMINAL HYSTERECTOMY W/ BILATERAL SALPINGOOPHORECTOMY      OB History    Gravida Para Term Preterm AB Living   '2 2 2         '$ SAB TAB Ectopic Multiple Live Births                   Home  Medications    Prior to Admission medications   Medication Sig Start Date End Date Taking? Authorizing Provider  acetaminophen (TYLENOL) 500 MG tablet Take 500 mg by mouth every 6 (six) hours as needed for mild pain or moderate pain.    Historical Provider, MD  albuterol (PROVENTIL HFA;VENTOLIN HFA) 108 (90 Base) MCG/ACT inhaler Inhale 2 puffs into the lungs every 4 (four) hours as needed for wheezing or shortness of breath. 07/23/15   Alycia Rossetti, MD  ALPRAZolam Duanne Moron) 0.25 MG tablet Take 1 tablet (0.25 mg total) by mouth at bedtime as needed. 06/14/16   Alycia Rossetti, MD  calcium citrate-vitamin D (CITRACAL+D) 315-200 MG-UNIT per tablet Take 1 tablet by mouth daily.      Historical Provider, MD  Cranberry 250 MG CAPS Take 250 mg by mouth daily.    Historical Provider, MD  Cyanocobalamin (VITAMIN B 12 PO) Take 1 tablet by mouth daily.     Historical Provider, MD  esomeprazole (NEXIUM) 40 MG capsule Take 1 capsule (40 mg total) by mouth daily before breakfast. 08/02/16   Mahala Menghini, PA-C  Fluticasone-Salmeterol (ADVAIR) 100-50 MCG/DOSE AEPB Inhale 1 puff into the lungs 2 (two) times daily. Patient taking differently: Inhale 1 puff into the lungs 2 (two) times daily as needed (Shortness of breath).  12/15/15   Alycia Rossetti, MD  gabapentin (NEURONTIN) 100 MG capsule Take 1-2 capsules at bedtime for nerve pain Patient taking differently: Take 100 mg by mouth at bedtime. Take 1-2 capsules at bedtime for nerve pain 06/14/16   Alycia Rossetti, MD  mirabegron ER (MYRBETRIQ) 25 MG TB24 tablet Take 1 tablet (25 mg total) by mouth at bedtime. For bladder 07/23/15   Alycia Rossetti, MD  pravastatin (PRAVACHOL) 20 MG tablet TAKE ONE TABLET BY MOUTH ONCE DAILY. 07/03/16   Alycia Rossetti, MD  Probiotic Product (PROBIOTIC DAILY PO) Take 1 capsule by mouth daily.     Historical Provider, MD  promethazine (PHENERGAN) 12.5 MG tablet 1-2 po 30 MINS PRIOR TO MEDS OR MEALS. MAY REPEAT EVERY 4-6 h prn  nausea or vomiting 06/23/16   Danie Binder, MD  rivaroxaban (XARELTO) 20 MG TABS tablet Take 1 tablet (20 mg total) by mouth daily. 07/12/16   [name], MD  Tiotropium Bromide Monohydrate (SPIRIVA RESPIMAT) 1.25 MCG/ACT AERS Inhale 2 puffs into the lungs daily. 06/14/16   Alycia Rossetti, MD  valsartan (DIOVAN) 160 MG tablet TAKE 1 TABLET BY MOUTH DAILY. 05/31/16   Alycia Rossetti, MD  ZETIA 10 MG tablet TAKE 1 TABLET BY MOUTH ONCE A DAY. 02/29/16   Herminio Commons, MD    Family History Family History  Problem Relation Age of Onset  . Liver disease Mother 27  . Hypertension Mother   . Cerebral aneurysm Father   . Aneurysm Father  78  . Cancer Sister   . Liver disease Brother   . Liver disease Brother   . Liver disease Brother   . Diabetes type II Brother   . Alcohol abuse Brother   . Cancer Sister     Social History Social History  Substance Use Topics  . Smoking status: Current Every Day Smoker    Packs/day: 0.50    Years: 50.00    Types: Cigarettes    Start date: 02/02/1966  . Smokeless tobacco: Never Used     Comment: 1/2 pack daily  . Alcohol use No     Allergies   Ace inhibitors; Aspirin; Codeine; Pineapple; Plasticized base [plastibase]; Ultram [tramadol]; Adhesive [tape]; Naprosyn [naproxen]; Biaxin [clarithromycin]; Clindamycin/lincomycin; Linzess [linaclotide]; Metronidazole; Nsaids; Penicillins; and Varenicline tartrate   Review of Systems Review of Systems ROS: Statement: All systems negative except as marked or noted in the HPI; Constitutional: Negative for fever and chills. +generalized weakness. ; ; Eyes: Negative for eye pain, redness and discharge. ; ; ENMT: Negative for ear pain, hoarseness, nasal congestion, sinus pressure and sore throat. ; ; Cardiovascular: Negative for chest pain, palpitations, diaphoresis, dyspnea and peripheral edema. ; ; Respiratory: Negative for cough, wheezing and stridor. ; ; Gastrointestinal: +poor PO intake. Negative for  nausea, vomiting, diarrhea, abdominal pain, blood in stool, hematemesis, jaundice and rectal bleeding. . ; ; Genitourinary: Negative for dysuria, flank pain and hematuria. ; ; Musculoskeletal: Negative for back pain and neck pain. Negative for swelling and trauma.; ; Skin: Negative for pruritus, rash, abrasions, blisters, bruising and skin lesion.; ; Neuro: Negative for headache, lightheadedness and neck stiffness. Negative for altered level of consciousness, altered mental status, extremity weakness, paresthesias, involuntary movement, seizure and syncope.      Physical Exam Updated Vital Signs BP (!) 93/49 (BP Location: Right Arm)   Pulse 68   Temp 97.5 F (36.4 C) (Oral)   Resp 16   Ht '5\' 7"'$  (1.702 m)   Wt 137 lb (62.1 kg)   SpO2 100%   BMI 21.46 kg/m    Patient Vitals for the past 24 hrs:  BP Temp Temp src Pulse Resp SpO2 Height Weight  08/02/16 1630 108/59 - - - 12 - - -  08/02/16 1624 118/63 - - - 15 - - -  08/02/16 1500 101/61 - - - 10 - - -  08/02/16 1423 (!) 93/49 97.5 F (36.4 C) Oral 68 16 100 % - -  08/02/16 1312 - - - - - - '5\' 7"'$  (1.702 m) 137 lb (62.1 kg)  08/02/16 1309 (!) 107/49 97.5 F (36.4 C) Tympanic 60 16 98 % '5\' 7"'$  (1.702 m) 137 lb (62.1 kg)     Physical Exam 1505: Physical examination:  Nursing notes reviewed; Vital signs and O2 SAT reviewed;  Constitutional: Well developed, Well nourished, In no acute distress; Head:  Normocephalic, atraumatic; Eyes: EOMI, PERRL, No scleral icterus; ENMT: Mouth and pharynx normal, Mucous membranes dry; Neck: Supple, Full range of motion, No lymphadenopathy; Cardiovascular: Regular rate and rhythm, No gallop; Respiratory: Breath sounds clear & equal bilaterally, No wheezes.  Speaking full sentences with ease, Normal respiratory effort/excursion; Chest: Nontender, Movement normal; Abdomen: Soft, Nontender, Nondistended, Normal bowel sounds; Genitourinary: No CVA tenderness; Extremities: Pulses normal, No tenderness, No edema, No  calf edema or asymmetry.; Neuro: AA&Ox3, Major CN grossly intact. No facial droop. Speech clear. Strength 4/5 equal bilat UE's and LE's. No apparent gross focal motor eficits in extremities.; Skin: Color normal, Warm, Dry.; Psych:  Affect flat.    ED Treatments / Results  Labs (all labs ordered are listed, but only abnormal results are displayed)   EKG  EKG Interpretation  Date/Time:  Wednesday August 02 2016 14:47:33 EST Ventricular Rate:  68 PR Interval:    QRS Duration: 153 QT Interval:  500 QTC Calculation: 532 R Axis:   -82 Text Interpretation:  Atrial-sensed ventricular-paced rhythm No further analysis attempted due to paced rhythm When compared with ECG of 06/28/2016 No significant change was found Confirmed by [facility]  MD, Nunzio Cory [phone]) on 08/02/2016 3:51:42 PM       Radiology   Procedures Procedures (including critical care time)  Medications Ordered in ED Medications - No data to display   Initial Impression / Assessment and Plan / ED Course  I have reviewed the triage vital signs and the nursing notes.  Pertinent labs & imaging results that were available during my care of the patient were reviewed by me and considered in my medical decision making (see chart for details).  MDM Reviewed: previous chart, nursing note and vitals Reviewed previous: labs and ECG Interpretation: labs, ECG and x-ray Total time providing critical care: 30-74 minutes. This excludes time spent performing separately reportable procedures and services. Consults: admitting MD    CRITICAL CARE Performed by: Alfonzo Feller Total critical care time: 35 minutes Critical care time was exclusive of separately billable procedures and treating other patients. Critical care was necessary to treat or prevent imminent or life-threatening deterioration. Critical care was time spent personally by me on the following activities: development of treatment plan with patient and/or surrogate  as well as nursing, discussions with consultants, evaluation of patient's response to treatment, examination of patient, obtaining history from patient or surrogate, ordering and performing treatments and interventions, ordering and review of laboratory studies, ordering and review of radiographic studies, pulse oximetry and re-evaluation of patient's condition.   Results for orders placed or performed during the hospital encounter of 08/02/16  Comprehensive metabolic panel  Result Value Ref Range   Sodium 138 135 - 145 mmol/L   Potassium 4.4 3.5 - 5.1 mmol/L   Chloride 106 101 - 111 mmol/L   CO2 26 22 - 32 mmol/L   Glucose, Bld 133 (H) 65 - 99 mg/dL   BUN 54 (H) 6 - 20 mg/dL   Creatinine, Ser 1.20 (H) 0.44 - 1.00 mg/dL   Calcium 9.4 8.9 - 10.3 mg/dL   Total Protein 6.8 6.5 - 8.1 g/dL   Albumin 3.6 3.5 - 5.0 g/dL   AST 24 15 - 41 U/L   ALT 16 14 - 54 U/L   Alkaline Phosphatase 33 (L) 38 - 126 U/L   Total Bilirubin 0.3 0.3 - 1.2 mg/dL   GFR calc non Af Amer 42 (L) >60 mL/min   GFR calc Af Amer 49 (L) >60 mL/min   Anion gap 6 5 - 15  Lipase, blood  Result Value Ref Range   Lipase 10 (L) 11 - 51 U/L  Troponin I  Result Value Ref Range   Troponin I <0.03 <0.03 ng/mL  Lactic acid, plasma  Result Value Ref Range   Lactic Acid, Venous 1.0 0.5 - 1.9 mmol/L  CBC with Differential  Result Value Ref Range   WBC 11.1 (H) 4.0 - 10.5 K/uL   RBC 1.95 (L) 3.87 - 5.11 MIL/uL   Hemoglobin 5.9 (LL) 12.0 - 15.0 g/dL   HCT 17.5 (L) 36.0 - 46.0 %   MCV 89.7 78.0 - 100.0 fL  MCH 30.3 26.0 - 34.0 pg   MCHC 33.7 30.0 - 36.0 g/dL   RDW 16.3 (H) 11.5 - 15.5 %   Platelets 246 150 - 400 K/uL   Neutrophils Relative % 67 %   Neutro Abs 7.4 1.7 - 7.7 K/uL   Lymphocytes Relative 27 %   Lymphs Abs 2.9 0.7 - 4.0 K/uL   Monocytes Relative 6 %   Monocytes Absolute 0.6 0.1 - 1.0 K/uL   Eosinophils Relative 1 %   Eosinophils Absolute 0.1 0.0 - 0.7 K/uL   Basophils Relative 0 %   Basophils Absolute 0.0  0.0 - 0.1 K/uL  Prepare RBC  Result Value Ref Range   Order Confirmation ORDER PROCESSED BY BLOOD BANK    Dg Chest 2 View Result Date: 08/02/2016 CLINICAL DATA:  Weakness, dehydration, and poor p.o. intake. EXAM: CHEST  2 VIEW COMPARISON:  06/28/2016 FINDINGS: Pacemaker remains in place with leads terminating over the right atrium and right ventricle. Cardiac silhouette is normal in size. Tortuosity and calcification are again noted involving the thoracic aorta. The patient's hand partially obscures the left lung base on the AP radiograph. No airspace consolidation, edema, pleural effusion, or pneumothorax is identified. Mild thoracic spondylosis is noted. IMPRESSION: 1. No active cardiopulmonary disease. 2. Aortic atherosclerosis. Electronically Signed   By: Logan Bores M.D.   On: 08/02/2016 16:24    Results for KELLEN, HOVER (MRN [id number]) as of 08/02/2016 17:19  Ref. Range 06/14/2016 12:21 06/28/2016 13:41 07/07/2016 16:12 07/11/2016 11:39 08/02/2016 15:20  Hemoglobin Latest Ref Range: 12.0 - 15.0 g/dL 10.3 (L) 10.8 (L) 9.5 (L) 9.1 (L) 5.9 (LL)  HCT Latest Ref Range: 36.0 - 46.0 % 32.8 (L) 31.6 (L) 29.8 (L) 27.4 (L) 17.5 (L)    1735:  EPIC chart reviewed: pt with hx anemia, previously declined EGD/TCS, but GI recommends same if anemia was not improved (GI Dr. Oneida Alar ofc note 07/26/16); PMD office note from today states pt did fecal occult testing at home which was negative.  Denies frank BRBPR/melena. Abd remains benign. IVF bolus given for hypotension with improvement. Will start to transfuse PRBC's. Dx and testing d/w pt and family.  Questions answered.  Verb understanding, agreeable to admit.  T/C to [facility] Dr. Lorin Mercy, case discussed, including:  HPI, pertinent PM/SHx, VS/PE, dx testing, ED course and treatment:  Agreeable to admit.     Final Clinical Impressions(s) / ED Diagnoses   Final diagnoses:  None    New Prescriptions New Prescriptions   No medications on file     Francine Graven, DO 08/06/16 1758

## 2016-08-02 NOTE — Progress Notes (Addendum)
Subjective:    Patient ID: Charlotte Henry, female    DOB: 1941-01-15, 76 y.o.   MRN: [id number]  Patient presents for Weakness (reports weakness and loss of appetite)  Patient here with ongoing weakness decreased appetite. She was last seen on February 6 at that time she was 149 pounds today she is 137 pounds. Please see her chart for complete details but on February 2 I saw her after she had been to the hospital for increased creatinine 1.4 dehydration. She was not tolerating medication given to her to treat H. pylori infection. She was given a liter of fluid in the emergency room. She came in on the second still feeling weak with little appetite. At her visit on February 6 I discontinued her Diovan as her blood pressure was still low and she was not eating very well. Her heart rate was normal. She also states history of anemia and her blood counts have been trending downward. I had her do fecal occult testing at home on her stools this was negative. She is currently on iron tablets. Last hemoglobin was 9.1 with a hematocrit of 27.4 on February 6  He was seen by gastroenterology on February 21 for her follow-up (Reviewed Note) , she was still not eating very well at that time and difficulty walking she was using her cane. 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 The iron tablets was causing some constipation however when she would take MiraLAX or once S this caused diarrhea therefore she stopped doing these. She did discuss with her doing possible endoscopy but she declined that and she was referred to neurology for the weakness in her legs. Plan  was to recheck her levels in 1 month if not improved she will need EGD and colonoscopy  Today she is accompanied by a family friend "Buddy". She is only been drinking ensure intaking eliminating Gatorade. States that she will try  to take a bite of food but does not have any appetite she denies any pain with eating denies any nausea. This morning she states that she is extremely weak and she felt very short of breath with taken in just a couple steps therefore came in appointment. She denies any chest pain. She is not having shortness of breath at rest. She is urinating normally but is constipated. She has been using her walker summer around the home but for the most part she is just in sitting and she is afraid of falling because of her weakness.  Review Of Systems:  GEN- denies fatigue, fever, weight loss,weakness, recent illness HEENT- denies eye drainage, change in vision, nasal discharge, CVS- denies chest pain, palpitations RESP- denies SOB, cough, wheeze ABD- denies N/V, change in stools, abd pain GU- denies dysuria, hematuria, dribbling, incontinence MSK- denies joint pain, muscle aches, injury Neuro- denies headache, dizziness, syncope, seizure activity       Objective:    BP 118/72   Pulse 88   Temp 97.9 F (36.6 C) (Oral)   Resp 18   Ht '5\' 7"'$  (1.702 m)   Wt 137 lb 3.2 oz (62.2 kg)   SpO2 96%   BMI 21.49 kg/m  GEN- NAD, alert and oriented x3,weak appearing, could only take 2 steps, became Short of breath had to sit back in wheelchair HEENT- PERRL, EOMI, non injected sclera, pink conjunctiva, dry MM, oropharynx clear Neck-  Supple, CVS- RRR, systolic murmur ,cap refill 4 sec  RESP-CTAB ABD-NABS,soft,NT,ND EXT- No edema Pulses- Radial  2+        Assessment & Plan:      Problem List Items Addressed This Visit    Normocytic anemia   Loss of weight   Generalized weakness    She has significant weakness which is progressing because of her anorexia and weight loss. Her weight is down 12 pounds in 3 weeks  an elderly female this is very dangerous. She also has underlying anemia which is also been decreasing with her last hemoglobin right at 9.1. She does have some family members that help her  during the day but she is getting progressively weaker. I think she needs to be admitted to the hospital. Unclear cause of the shortness of breath unless it just underlying from her generalized weakness she did not have any wheezing or not note any fluid on exam she does not have any fever. Possible anemia has worsened as well.  She is unsafe to stay at home at this time.  Pt to go to ER, she preferred to have family friend drive her instead of EMS      Chronic kidney disease (CKD), stage III (moderate) - Primary    Other Visit Diagnoses    Anorexia       SOB (shortness of breath)          Note: This dictation was prepared with Dragon dictation along with smaller phrase technology. Any transcriptional errors that result from this process are unintentional.

## 2016-08-03 DIAGNOSIS — E1159 Type 2 diabetes mellitus with other circulatory complications: Secondary | ICD-10-CM

## 2016-08-03 DIAGNOSIS — R634 Abnormal weight loss: Secondary | ICD-10-CM

## 2016-08-03 DIAGNOSIS — N183 Chronic kidney disease, stage 3 (moderate): Secondary | ICD-10-CM

## 2016-08-03 DIAGNOSIS — D649 Anemia, unspecified: Secondary | ICD-10-CM

## 2016-08-03 DIAGNOSIS — F17200 Nicotine dependence, unspecified, uncomplicated: Secondary | ICD-10-CM

## 2016-08-03 DIAGNOSIS — K219 Gastro-esophageal reflux disease without esophagitis: Secondary | ICD-10-CM

## 2016-08-03 DIAGNOSIS — I10 Essential (primary) hypertension: Secondary | ICD-10-CM

## 2016-08-03 LAB — CBC
HCT: 23.5 % — ABNORMAL LOW (ref 36.0–46.0)
Hemoglobin: 7.9 g/dL — ABNORMAL LOW (ref 12.0–15.0)
MCH: 29.2 pg (ref 26.0–34.0)
MCHC: 33.6 g/dL (ref 30.0–36.0)
MCV: 86.7 fL (ref 78.0–100.0)
PLATELETS: 187 10*3/uL (ref 150–400)
RBC: 2.71 MIL/uL — ABNORMAL LOW (ref 3.87–5.11)
RDW: 15.7 % — AB (ref 11.5–15.5)
WBC: 9.5 10*3/uL (ref 4.0–10.5)

## 2016-08-03 LAB — BASIC METABOLIC PANEL
Anion gap: 6 (ref 5–15)
BUN: 47 mg/dL — AB (ref 6–20)
CO2: 24 mmol/L (ref 22–32)
Calcium: 8.5 mg/dL — ABNORMAL LOW (ref 8.9–10.3)
Chloride: 108 mmol/L (ref 101–111)
Creatinine, Ser: 1.08 mg/dL — ABNORMAL HIGH (ref 0.44–1.00)
GFR calc Af Amer: 56 mL/min — ABNORMAL LOW (ref >60)
GFR, EST NON AFRICAN AMERICAN: 48 mL/min — AB (ref >60)
Glucose, Bld: 98 mg/dL (ref 65–99)
POTASSIUM: 4.2 mmol/L (ref 3.5–5.1)
SODIUM: 138 mmol/L (ref 135–145)

## 2016-08-03 LAB — VITAMIN B12: VITAMIN B 12: 1167 pg/mL — AB (ref 180–914)

## 2016-08-03 LAB — FOLATE: Folate: 13.1 ng/mL (ref >5.9)

## 2016-08-03 LAB — RETICULOCYTES
RBC.: 2.67 MIL/uL — AB (ref 3.87–5.11)
RETIC COUNT ABSOLUTE: 149.5 10*3/uL (ref 19.0–186.0)
Retic Ct Pct: 5.6 % — ABNORMAL HIGH (ref 0.4–3.1)

## 2016-08-03 LAB — FERRITIN: FERRITIN: 150 ng/mL (ref 11–307)

## 2016-08-03 LAB — LACTATE DEHYDROGENASE: LDH: 103 U/L (ref 98–192)

## 2016-08-03 LAB — IRON AND TIBC
IRON: 52 ug/dL (ref 28–170)
Saturation Ratios: 23 % (ref 10.4–31.8)
TIBC: 230 ug/dL — AB (ref 250–450)
UIBC: 178 ug/dL

## 2016-08-03 MED ORDER — PEG 3350-KCL-NA BICARB-NACL 420 G PO SOLR
2000.0000 mL | Freq: Once | ORAL | Status: AC
  Administered 2016-08-03: 2000 mL via ORAL
  Filled 2016-08-03: qty 4000

## 2016-08-03 MED ORDER — PEG 3350-KCL-NA BICARB-NACL 420 G PO SOLR
2000.0000 mL | Freq: Once | ORAL | Status: AC
  Filled 2016-08-03: qty 4000

## 2016-08-03 NOTE — Progress Notes (Signed)
PROGRESS NOTE    Charlotte Henry  WIO:[id number]  DOB: 1940/07/21  DOA: 08/02/2016 PCP: [name], MD Outpatient Specialists:  Hospital course: Charlotte Henry is a 76 y.o. female with medical history significant of pacemaker placement, PVD, ongoing tobacco dependence, h/o TIA, HTN, HLD, h/o peptic ulcer, GERD, DM, CAD, COPD, and CKD presenting with weakness, anorexia.  Patient has been unable to walk since January 9.  She had been on Flagyl and Biaxin.  She began vomiting and was unable to eat, nauseated all the time.  Daughter called Dr. Oneida Alar and she was told to stop it but could no longer walk.  Fainted Jan 21 and has been sick ever since.  Since then, she has lost from 152 to 137 (weight was actually 148 on 1/10 PCP visit and is 139 today).  No appetite.  No dysphagia.  Just doesn't like food.  No n/v.  Ensure is constipating her, took an enema on Monday with improvement.  But Ensure is the only thing that helps her to have the energy to walk.  +dizzy, intermittent fainting.  +wheezing/SOB - chronically.  Has not used inhalers recently.  Numbness in both arms at night when laying on them.  Has had some rashes for hands and neck.  Has refused EGD/colo in the recent past but did 3 recent negative home stool cards.  Assessment & Plan:   Anemia - Pt feeling better after blood transfusion and Hg now >7.  Hemoccult negative and no black stools reported.  She is on Xarelto and asked for GI consult.  Pending recommendations.    Hypertension - stable, some soft BPs noted and holding her home meds  CKD - stable, following..   Tobacco Dependence - cessation counseled at bedside.  Pt verbalized undestanding.   Severe protein cal malnutrition - consult to dietitian.   Diabetes Mellitus, type 2 Follow BS Stable at this time Diet controlled  DVT prophylaxis:  SCDS Code Status: full  Family Communication: family at bedside Disposition Plan:  TBD   Consultants: GI  Procedures:  pending  Subjective: Pt says she feels better after blood transfusion, no black stools seen.   Objective: Vitals:   08/02/16 2226 08/02/16 2250 08/03/16 0150 08/03/16 0616  BP: (!) 102/55 104/61 111/61 (!) 96/50  Pulse: 64 62 60 60  Resp: '16 16 14 15  '$ Temp: 98.7 F (37.1 C) 97.8 F (36.6 C) 98.5 F (36.9 C) 98.9 F (37.2 C)  TempSrc: Oral Oral Oral Oral  SpO2: 100% 99% 100% 100%  Weight:      Height:        Intake/Output Summary (Last 24 hours) at 08/03/16 1441 Last data filed at 08/03/16 0900  Gross per 24 hour  Intake             2838 ml  Output             1100 ml  Net             1738 ml   Filed Weights   08/02/16 1309 08/02/16 1312 08/02/16 1834  Weight: 62.1 kg (137 lb) 62.1 kg (137 lb) 63.2 kg (139 lb 6.4 oz)    Exam:  General exam: awake, alert, NAD.   Respiratory system:  No increased work of breathing. Cardiovascular system: S1 & S2 heard. Gastrointestinal system: Abdomen is nondistended, soft and nontender. Normal bowel sounds heard. Central nervous system: Alert and oriented. No focal neurological deficits. Extremities: no cyanosis.    Data Reviewed: Basic  Metabolic Panel:  Recent Labs Lab 08/02/16 1520 08/03/16 0509  NA 138 138  K 4.4 4.2  CL 106 108  CO2 26 24  GLUCOSE 133* 98  BUN 54* 47*  CREATININE 1.20* 1.08*  CALCIUM 9.4 8.5*   Liver Function Tests:  Recent Labs Lab 08/02/16 1520  AST 24  ALT 16  ALKPHOS 33*  BILITOT 0.3  PROT 6.8  ALBUMIN 3.6    Recent Labs Lab 08/02/16 1520  LIPASE 10*   No results for input(s): AMMONIA in the last 168 hours. CBC:  Recent Labs Lab 08/02/16 1520 08/03/16 0509  WBC 11.1* 9.5  NEUTROABS 7.4  --   HGB 5.9* 7.9*  HCT 17.5* 23.5*  MCV 89.7 86.7  PLT 246 187   Cardiac Enzymes:  Recent Labs Lab 08/02/16 1520  TROPONINI <0.03   CBG (last 3)  No results for input(s): GLUCAP in the last 72 hours. No results found for this or any  previous visit (from the past 240 hour(s)).   Studies: Dg Chest 2 View  Result Date: 08/02/2016 CLINICAL DATA:  Weakness, dehydration, and poor p.o. intake. EXAM: CHEST  2 VIEW COMPARISON:  06/28/2016 FINDINGS: Pacemaker remains in place with leads terminating over the right atrium and right ventricle. Cardiac silhouette is normal in size. Tortuosity and calcification are again noted involving the thoracic aorta. The patient's hand partially obscures the left lung base on the AP radiograph. No airspace consolidation, edema, pleural effusion, or pneumothorax is identified. Mild thoracic spondylosis is noted. IMPRESSION: 1. No active cardiopulmonary disease. 2. Aortic atherosclerosis. Electronically Signed   By: Logan Bores M.D.   On: 08/02/2016 16:24   Scheduled Meds: . ezetimibe  10 mg Oral Daily  . gabapentin  100 mg Oral QHS  . pantoprazole  40 mg Intravenous Q12H  . polyethylene glycol-electrolytes  2,000 mL Oral Once  . polyethylene glycol-electrolytes  2,000 mL Oral Once  . pravastatin  20 mg Oral q1800  . tiotropium  1 capsule Inhalation Daily   Continuous Infusions: . sodium chloride 100 mL/hr at 08/03/16 0156    Principal Problem:   Symptomatic anemia Active Problems:   Diabetes mellitus (HCC)   Essential hypertension   Gastroesophageal reflux disease   Chronic kidney disease (CKD), stage III (moderate)   Tobacco use disorder   Weight loss, unintentional   Time spent:   Irwin Brakeman, MD, FAAFP [facility] Pager [phone]  If 7PM-7AM, please contact night-coverage [email or web address] Password [email or web address] 08/03/2016, 2:41 PM    LOS: 1 day

## 2016-08-03 NOTE — Consult Note (Signed)
Referring Provider: No ref. provider found Primary Care Physician:  [name], MD Primary Gastroenterologist:  Dr. Oneida Alar  Date of Admission: 08/02/16 Date of Consultation: 08/03/16  Reason for Consultation:  Symptomatic anemia  HPI:  Charlotte Henry is a 76 y.o. female with a past medical history of PPM, CVD, heart block, COPD, CAD, DM, GERD, PUD, H. Pylori, TIA currently on anticoagulation with Xarelto and PPI with Nexium 40 mg bid. She was last seen in our office 07/26/2016 for follow-up on normocytic anemia as well as H. pylori gastritis which failed initial treatment. At that time she denied hematochezia or melena, previous H. pylori treatment failed to eradicate and was treated with Biaxin and Flagyl but did not tolerate antibiotics causing nausea/vomiting and weakness to the point of inability to walk. The patient stopped treatment with 2 days left. At that time it was noted weakness improved but not resolved possible peripheral neuropathy from Flagyl and subsequently Biaxin and Flagyl were added to her allergy list. Low hemoglobin which is normocytic without obvious GI bleed likely due to chronic illness. Taking iron. Recommended endoscopy and colonoscopy but the patient refused. Note recommended EGD and colonoscopy if anemia not improved, follow-up visit scheduled for March 22. Plan to recheck the H. pylori breath test in July 2018.  The patient presented to the emergency room yesterday, 08/02/2016 for gradual onset and worsening of persistent generalized weakness which became severe over the previous 3 days, associated with poor intake. Denies abdominal pain, nausea/vomiting, fevers. Labs demonstrate a creatinine at 1.20, mild leukocytosis with a white blood cell count of 11.1, significant anemia with a hemoglobin of 5.9. This was, as per her chronic condition, normocytic, normochromic. Per hospitalist H&P patient also noted weight loss from 152-137 since mid January, dizziness,  intermittent fainting, shortness of breath. Recent stool Hemoccult cards negative 3 as an outpatient. The patient was arranged for transfusion of 1 unit of PRBC, GI consult for possible GI bleed although less likely for negative guaiac 3 and no obvious GI bleeding. Possible acute blood loss or other chronic process.  Today she is joined by her two granddaughters in her room. She confirms the above information. Denies abdominal pain at all since her weakness began. No current abdominal pain either. Denies any hematochezia or melena. She states she has specifically been looking for dark stools because she is on iron, but has seen none. She states that her primary care did heme stool cards 3 times and there were each negative. Denies nausea and vomiting. Regarding her weakness and shortness of breath she feels much better today compared to yesterday after receiving blood. No other upper or lower GI symptoms.  Past Medical History:  Diagnosis Date  . 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 ASCVD (arteriosclerotic cardiovascular disease)    a. cath in 4/04,-80%LAD; 70% RCA, -> DESx2; residual 60% distal RCA; nl EF  b. Nuc 02/2016 aborted due to bradycardia. Will need to be rescheduled   . Carotid artery occlusion   . Cerebrovascular disease   . CHB (complete heart block) ([place])    a. s/p PPM on 03/24/16 with a St. Jude (serial number [id number]) pacemaker  . Chronic kidney disease (CKD), stage III (moderate)   . Clostridium difficile colitis 03/2013   a. 03/2013  . COPD (chronic obstructive pulmonary disease) ([place])   . Coronary disease   . Diabetes mellitus   . Diverticulosis   . GERD (gastroesophageal reflux disease)   . Gout   . H pylori ulcer   .  Hyperlipidemia    chose to stop statin, takes Zetia  . Hypertension    not currently on medication  . IBS (irritable bowel syndrome)   . PVD (peripheral vascular disease) ([place])    a. moderate to severely decreased ABI's in 8/08, sig. aortic inflow  disease b. repeat ABI's in 2011 improved- mild disease on the left and moderate to severe disease on the right  . S/P placement of cardiac pacemaker    a. 03/24/16: St. Jude (serial number [id number]) pacemaker  . TIA (transient ischemic attack) 2012  . Tobacco abuse    a. 50 pack years continuing at 1/2 pack per day    Past Surgical History:  Procedure Laterality Date  . ABDOMINAL HYSTERECTOMY    . carpel tunnel release     bilateral  . CHOLECYSTECTOMY    . COLONOSCOPY  2009  . EP IMPLANTABLE DEVICE N/A 03/24/2016   Procedure: Pacemaker Implant;  Surgeon: [name], MD;  Location: [facility];  Service: Cardiovascular;  Laterality: N/A;  . TOTAL ABDOMINAL HYSTERECTOMY W/ BILATERAL SALPINGOOPHORECTOMY      Prior to Admission medications   Medication Sig Start Date End Date Taking? Authorizing Provider  acetaminophen (TYLENOL) 500 MG tablet Take 500 mg by mouth every 6 (six) hours as needed for mild pain or moderate pain.   Yes Historical Provider, MD  albuterol (PROVENTIL HFA;VENTOLIN HFA) 108 (90 Base) MCG/ACT inhaler Inhale 2 puffs into the lungs every 4 (four) hours as needed for wheezing or shortness of breath. 07/23/15  Yes Alycia Rossetti, MD  ALPRAZolam Duanne Moron) 0.25 MG tablet Take 1 tablet (0.25 mg total) by mouth at bedtime as needed. 06/14/16  Yes Alycia Rossetti, MD  calcium citrate-vitamin D (CITRACAL+D) 315-200 MG-UNIT per tablet Take 1 tablet by mouth daily.     Yes Historical Provider, MD  Cranberry 250 MG CAPS Take 250 mg by mouth daily.   Yes Historical Provider, MD  Cyanocobalamin (VITAMIN B 12 PO) Take 1 tablet by mouth daily.    Yes Historical Provider, MD  esomeprazole (NEXIUM) 40 MG capsule Take 1 capsule (40 mg total) by mouth daily before breakfast. Patient taking differently: Take 40 mg by mouth 2 (two) times daily before a meal.  08/02/16  Yes Mahala Menghini, PA-C  ferrous sulfate 325 (65 FE) MG tablet Take 325 mg by mouth daily with breakfast.   Yes  Historical Provider, MD  Fluticasone-Salmeterol (ADVAIR) 100-50 MCG/DOSE AEPB Inhale 1 puff into the lungs 2 (two) times daily. Patient taking differently: Inhale 1 puff into the lungs 2 (two) times daily as needed (Shortness of breath).  12/15/15  Yes Alycia Rossetti, MD  gabapentin (NEURONTIN) 100 MG capsule Take 1-2 capsules at bedtime for nerve pain Patient taking differently: Take 100 mg by mouth at bedtime. Take 1-2 capsules at bedtime for nerve pain 06/14/16  Yes Alycia Rossetti, MD  lubiprostone (AMITIZA) 8 MCG capsule Take 8 mcg by mouth daily with breakfast.   Yes Historical Provider, MD  mirabegron ER (MYRBETRIQ) 25 MG TB24 tablet Take 1 tablet (25 mg total) by mouth at bedtime. For bladder Patient taking differently: Take 25 mg by mouth daily as needed (bladder). For bladder 07/23/15  Yes Alycia Rossetti, MD  Probiotic Product (PROBIOTIC DAILY PO) Take 1 capsule by mouth daily.    Yes Historical Provider, MD  promethazine (PHENERGAN) 12.5 MG tablet 1-2 po 30 MINS PRIOR TO MEDS OR MEALS. MAY REPEAT EVERY 4-6 h prn nausea or  vomiting 06/23/16  Yes Danie Binder, MD  rivaroxaban (XARELTO) 20 MG TABS tablet Take 1 tablet (20 mg total) by mouth daily. 07/12/16  Yes [name], MD  Tiotropium Bromide Monohydrate (SPIRIVA RESPIMAT) 1.25 MCG/ACT AERS Inhale 2 puffs into the lungs daily. 06/14/16  Yes Alycia Rossetti, MD  valsartan (DIOVAN) 160 MG tablet TAKE 1 TABLET BY MOUTH DAILY. 05/31/16  Yes Alycia Rossetti, MD  ZETIA 10 MG tablet TAKE 1 TABLET BY MOUTH ONCE A DAY. 02/29/16  Yes Herminio Commons, MD    Current Facility-Administered Medications  Medication Dose Route Frequency Provider Last Rate Last Dose  . 0.9 %  sodium chloride infusion   Intravenous Continuous Francine Graven, DO 100 mL/hr at 08/03/16 0156    . acetaminophen (TYLENOL) tablet 650 mg  650 mg Oral Q6H PRN Karmen Bongo, MD       Or  . acetaminophen (TYLENOL) suppository 650 mg  650 mg Rectal Q6H PRN Karmen Bongo, MD      . albuterol (PROVENTIL) (2.5 MG/3ML) 0.083% nebulizer solution 3 mL  3 mL Inhalation Q4H PRN Karmen Bongo, MD      . ALPRAZolam Duanne Moron) tablet 0.25 mg  0.25 mg Oral QHS PRN Karmen Bongo, MD      . ezetimibe (ZETIA) tablet 10 mg  10 mg Oral Daily Karmen Bongo, MD      . gabapentin (NEURONTIN) capsule 100 mg  100 mg Oral QHS Karmen Bongo, MD   100 mg at 08/02/16 2204  . ondansetron (ZOFRAN) tablet 4 mg  4 mg Oral Q6H PRN Karmen Bongo, MD       Or  . ondansetron [facility]) injection 4 mg  4 mg Intravenous Q6H PRN Karmen Bongo, MD      . pantoprazole (PROTONIX) injection 40 mg  40 mg Intravenous Q12H Karmen Bongo, MD   40 mg at 08/03/16 1201  . pravastatin (PRAVACHOL) tablet 20 mg  20 mg Oral q1800 Karmen Bongo, MD      . tiotropium [facility]) inhalation capsule 18 mcg  1 capsule Inhalation Daily Karmen Bongo, MD   18 mcg at 08/03/16 1047    Allergies as of 08/02/2016 - Review Complete 08/02/2016  Allergen Reaction Noted  . Ace inhibitors Hives, Shortness Of Breath, and Swelling 10/27/2008  . Aspirin Other (See Comments) 10/27/2008  . Codeine Swelling 02/02/2011  . Pineapple Shortness Of Breath and Swelling 09/03/2013  . Plasticized base [plastibase] Hives and Swelling 02/07/2012  . Ultram [tramadol] Swelling 03/21/2012  . Adhesive [tape] Swelling and Rash 02/07/2012  . Naprosyn [naproxen] Swelling 02/02/2011  . Biaxin [clarithromycin] Nausea And Vomiting and Other (See Comments) 07/04/2016  . Clindamycin/lincomycin Other (See Comments) 05/04/2014  . Linzess [linaclotide]  02/26/2013  . Metronidazole  06/29/2016  . Nsaids  03/04/2012  . Penicillins  05/04/2014  . Varenicline tartrate Swelling 10/27/2008    Family History  Problem Relation Age of Onset  . Liver disease Mother 15  . Hypertension Mother   . Cerebral aneurysm Father   . Aneurysm Father 1  . Cancer Sister   . Liver disease Brother   . Liver disease Brother   . Liver disease Brother   .  Diabetes type II Brother   . Alcohol abuse Brother   . Cancer Sister     Social History   Social History  . Marital status: Widowed    Spouse name: N/A  . Number of children: N/A  . Years of education: N/A   Occupational History  .  Not on file.   Social History Main Topics  . Smoking status: Current Every Day Smoker    Packs/day: 0.50    Years: 50.00    Types: Cigarettes    Start date: 02/02/1966  . Smokeless tobacco: Never Used     Comment: 1/2 pack daily  . Alcohol use No  . Drug use: No  . Sexual activity: Not Currently   Other Topics Concern  . Not on file   Social History Narrative  . No narrative on file    Review of Systems: General: Negative for anorexia, weight loss, fever, chills. ENT: Negative for hoarseness, difficulty swallowing. CV: Negative for chest pain, angina, palpitations, peripheral edema.  Respiratory: Negative for dyspnea at rest, cough, sputum, wheezing.  GI: See history of present illness. Derm: Negative for rash or itching.  Endo: Negative for unusual weight change.  Heme: Negative for bruising or bleeding. Allergy: Negative for rash or hives.  Physical Exam: Vital signs in last 24 hours: Temp:  [97.4 F (36.3 C)-98.9 F (37.2 C)] 98.9 F (37.2 C) (03/01 0616) Pulse Rate:  [60-68] 60 (03/01 0616) Resp:  [10-18] 15 (03/01 0616) BP: (93-122)/(45-64) 96/50 (03/01 0616) SpO2:  [97 %-100 %] 100 % (03/01 0616) Weight:  [137 lb (62.1 kg)-139 lb 6.4 oz (63.2 kg)] 139 lb 6.4 oz (63.2 kg) (02/28 1834) Last BM Date: 08/01/16 General:   Alert,  Well-developed, well-nourished, pleasant and cooperative in NAD Head:  Normocephalic and atraumatic. Eyes:  Sclera clear, no icterus. Conjunctiva pink. Ears:  Normal auditory acuity. Neck:  Supple; no masses or thyromegaly. Lungs:  Clear throughout to auscultation.   No wheezes, crackles, or rhonchi. No acute distress. Heart:  Regular rate and rhythm; no murmurs, clicks, rubs,  or gallops. Abdomen:   Soft, nontender and nondistended. No masses, hepatosplenomegaly or hernias noted. Normal bowel sounds, without guarding, and without rebound.   Rectal:  Deferred.   Msk:  Symmetrical without gross deformities. Extremities:  Without clubbing or edema. Neurologic:  Alert and  oriented x4;  grossly normal neurologically. Psych:  Alert and cooperative. Normal mood and flat affect.  Intake/Output from previous day: 02/28 0701 - 03/01 0700 In: 2838 [I.V.:1580; Blood:758; IV Piggyback:500] Out: 900 [Urine:900] Intake/Output this shift: Total I/O In: 0  Out: 200 [Urine:200]  Lab Results:  Recent Labs  08/02/16 1520 08/03/16 0509  WBC 11.1* 9.5  HGB 5.9* 7.9*  HCT 17.5* 23.5*  PLT 246 187   BMET  Recent Labs  08/02/16 1520 08/03/16 0509  NA 138 138  K 4.4 4.2  CL 106 108  CO2 26 24  GLUCOSE 133* 98  BUN 54* 47*  CREATININE 1.20* 1.08*  CALCIUM 9.4 8.5*   LFT  Recent Labs  08/02/16 1520  PROT 6.8  ALBUMIN 3.6  AST 24  ALT 16  ALKPHOS 33*  BILITOT 0.3   PT/INR No results for input(s): LABPROT, INR in the last 72 hours. Hepatitis Panel No results for input(s): HEPBSAG, HCVAB, HEPAIGM, HEPBIGM in the last 72 hours. C-Diff No results for input(s): CDIFFTOX in the last 72 hours.  Studies/Results: Dg Chest 2 View  Result Date: 08/02/2016 CLINICAL DATA:  Weakness, dehydration, and poor p.o. intake. EXAM: CHEST  2 VIEW COMPARISON:  06/28/2016 FINDINGS: Pacemaker remains in place with leads terminating over the right atrium and right ventricle. Cardiac silhouette is normal in size. Tortuosity and calcification are again noted involving the thoracic aorta. The patient's hand partially obscures the left lung base on the AP radiograph.  No airspace consolidation, edema, pleural effusion, or pneumothorax is identified. Mild thoracic spondylosis is noted. IMPRESSION: 1. No active cardiopulmonary disease. 2. Aortic atherosclerosis. Electronically Signed   By: Logan Bores M.D.    On: 08/02/2016 16:24    Impression: Doesn't 76 year old female with a history of normocytic, normochromic anemia deemed likely due to chronic disease. She began having weakness and not feeling well when taking antibiotics for H. pylori which she completed all except 2 days. Her antibiotics were stopped. Her weakness seemed to improve briefly and then became worse again. Over that time she had a significant drop in her hemoglobin. She presented to the emergency room complaining of weakness, fainting, and shortness of breath. Her hemoglobin was found to be 5.9, again normocytic normochromic. She states her PCP completed heme stool cards 3 which were negative. We had previously recommended colonoscopy and endoscopy to evaluate her anemia which she refused. I feel it is prudent to again recommend these to further evaluate her anemia for any GI component. Complicating things, she is on Xarelto. It has been held for the previous 2 days.  Her 2 units of PRBCs her hemoglobin has increased to 7.9 and she is significantly improved. Continues to deny abdominal pain and GERD symptoms. However, she does have a history of H. pylori gastritis and peptic ulcer disease. Differentials for her bleeding includes GI bleed from gastritis, peptic ulcer disease, esophagitis, H. pylori. Other less likely etiologies include a malignancy of the upper or lower GI tract.  After discussing with her family in the room, she agrees to colonoscopy and endoscopy tomorrow with Dr. Oneida Alar. We will plan for this and get it scheduled. Further recommendations to follow  Plan: 1. EGD and colonoscopy tomorrow with Dr. Oneida Alar on conscious sedation 2. Clear liquids today, NPO after midnight 3. Golytely split prep: 2L from 1400-1600; 2L 2200 - 2400 4. Tap water enema x 2 in the morning 5. Further recommendations based on endoscopic results. 6. Supportive measures 7. Follow H/H 8. Monitor for obvious GI bleed 9. Transfuse as  necessary   Thank you for allowing [place] to participate in the care of Georgianne Fick, DNP, AGNP-C Adult & Gerontological Nurse Practitioner [facility]    LOS: 1 day     08/03/2016, 12:53 PM

## 2016-08-03 NOTE — Progress Notes (Signed)
Golytely started at 1500.

## 2016-08-03 NOTE — Progress Notes (Signed)
Notified Walden Field, NP that the patient had thrown up all the Golytely that she had drank along with her clear liquid breakfast.  Zorfan was ordered 1x dose and I was told to encourage her to continue to drink the ordered Golytely.  I verbalized understanding and reported to the patient the new orders.

## 2016-08-03 NOTE — Progress Notes (Signed)
Pt is refusing bed alarm and to call nurse when she needs to get out of bed to use the bathroom, educated patient on importance of following safety protocol and risk of falling. Advised pt to call nurse directly if she needs to get out of bed. Will continue to monitor.

## 2016-08-03 NOTE — Care Management Note (Signed)
Case Management Note  Patient Details  Name: Charlotte Henry MRN: [id number] Date of Birth: Sep 17, 1940  Subjective/Objective:                  Pt admitted with symptomatic anemia. She is from home, lives alone but has a granddaughter who has been staying with her the past few weeks. She is ind with ADL's. She has PCP, transportation to appointments and no difficulty affording or managing her medications. She plans to return home with self care at DC. Pt reports being extremely week the past few weeks. Will order PT eval.   Action/Plan: Will cont to follow for DC planning needs.   Expected Discharge Date:  08/04/16               Expected Discharge Plan:  Home/Self Care vs Home with [facility]  In-House Referral:  NA  Discharge planning Services  CM Consult  Post Acute Care Choice:  NA Choice offered to:  NA  Status of Service:  Cont to follow.   Sherald Barge, RN 08/03/2016, 1:48 PM

## 2016-08-04 ENCOUNTER — Encounter (HOSPITAL_COMMUNITY): Payer: Medicare HMO

## 2016-08-04 ENCOUNTER — Encounter (HOSPITAL_COMMUNITY): Payer: Self-pay | Admitting: *Deleted

## 2016-08-04 ENCOUNTER — Encounter (HOSPITAL_COMMUNITY)
Admission: EM | Disposition: A | Discharge status: Home / Self Care | Payer: Self-pay | Source: Home / Self Care | Attending: Family Medicine

## 2016-08-04 DIAGNOSIS — K222 Esophageal obstruction: Secondary | ICD-10-CM

## 2016-08-04 DIAGNOSIS — K922 Gastrointestinal hemorrhage, unspecified: Secondary | ICD-10-CM

## 2016-08-04 DIAGNOSIS — E46 Unspecified protein-calorie malnutrition: Secondary | ICD-10-CM | POA: Insufficient documentation

## 2016-08-04 DIAGNOSIS — K2970 Gastritis, unspecified, without bleeding: Secondary | ICD-10-CM

## 2016-08-04 DIAGNOSIS — D500 Iron deficiency anemia secondary to blood loss (chronic): Principal | ICD-10-CM

## 2016-08-04 HISTORY — PX: ESOPHAGOGASTRODUODENOSCOPY: SHX5428

## 2016-08-04 HISTORY — PX: COLONOSCOPY: SHX5424

## 2016-08-04 LAB — CBC
HEMATOCRIT: 19.7 % — AB (ref 36.0–46.0)
HEMOGLOBIN: 6.3 g/dL — AB (ref 12.0–15.0)
MCH: 28.6 pg (ref 26.0–34.0)
MCHC: 32 g/dL (ref 30.0–36.0)
MCV: 89.5 fL (ref 78.0–100.0)
Platelets: 159 10*3/uL (ref 150–400)
RBC: 2.2 MIL/uL — AB (ref 3.87–5.11)
RDW: 17.3 % — ABNORMAL HIGH (ref 11.5–15.5)
WBC: 7.4 10*3/uL (ref 4.0–10.5)

## 2016-08-04 LAB — PROTEIN ELECTROPHORESIS, SERUM
A/G Ratio: 1.3 (ref 0.7–1.7)
Albumin ELP: 2.9 g/dL (ref 2.9–4.4)
Alpha-1-Globulin: 0.2 g/dL (ref 0.0–0.4)
Alpha-2-Globulin: 0.5 g/dL (ref 0.4–1.0)
Beta Globulin: 0.8 g/dL (ref 0.7–1.3)
GAMMA GLOBULIN: 0.8 g/dL (ref 0.4–1.8)
GLOBULIN, TOTAL: 2.3 g/dL (ref 2.2–3.9)
TOTAL PROTEIN ELP: 5.2 g/dL — AB (ref 6.0–8.5)

## 2016-08-04 LAB — OCCULT BLOOD X 1 CARD TO LAB, STOOL: FECAL OCCULT BLD: POSITIVE — AB

## 2016-08-04 LAB — PREPARE RBC (CROSSMATCH)

## 2016-08-04 LAB — URINE CULTURE

## 2016-08-04 LAB — HAPTOGLOBIN: HAPTOGLOBIN: 77 mg/dL (ref 34–200)

## 2016-08-04 SURGERY — COLONOSCOPY
Anesthesia: Moderate Sedation

## 2016-08-04 MED ORDER — PANTOPRAZOLE SODIUM 40 MG PO TBEC
40.0000 mg | DELAYED_RELEASE_TABLET | Freq: Every day | ORAL | Status: DC
  Administered 2016-08-04: 40 mg via ORAL
  Filled 2016-08-04: qty 1

## 2016-08-04 MED ORDER — ADULT MULTIVITAMIN W/MINERALS CH
1.0000 | ORAL_TABLET | Freq: Every day | ORAL | Status: DC
  Administered 2016-08-04: 1 via ORAL
  Filled 2016-08-04 (×2): qty 1

## 2016-08-04 MED ORDER — ACETAMINOPHEN 325 MG PO TABS
650.0000 mg | ORAL_TABLET | Freq: Once | ORAL | Status: AC
  Administered 2016-08-04: 650 mg via ORAL
  Filled 2016-08-04: qty 2

## 2016-08-04 MED ORDER — SODIUM CHLORIDE 0.9 % IV SOLN
Freq: Once | INTRAVENOUS | Status: AC
  Administered 2016-08-04: 20:00:00 via INTRAVENOUS

## 2016-08-04 MED ORDER — POLYETHYLENE GLYCOL 3350 17 G PO PACK
17.0000 g | PACK | ORAL | Status: AC
  Administered 2016-08-04 (×6): 17 g via ORAL
  Filled 2016-08-04 (×5): qty 1

## 2016-08-04 MED ORDER — MIDAZOLAM HCL 5 MG/5ML IJ SOLN
INTRAMUSCULAR | Status: DC | PRN
  Administered 2016-08-04: 1 mg via INTRAVENOUS
  Administered 2016-08-04: 2 mg via INTRAVENOUS

## 2016-08-04 MED ORDER — BISACODYL 5 MG PO TBEC
5.0000 mg | DELAYED_RELEASE_TABLET | ORAL | Status: AC
  Administered 2016-08-04 (×2): 5 mg via ORAL
  Filled 2016-08-04: qty 1

## 2016-08-04 MED ORDER — GLUCERNA SHAKE PO LIQD: 237.0000 mL | Freq: Two times a day (BID) | ORAL | Status: DC

## 2016-08-04 MED ORDER — MEPERIDINE HCL 100 MG/ML IJ SOLN
INTRAMUSCULAR | Status: DC | PRN
  Administered 2016-08-04: 25 mg
  Administered 2016-08-04: 25 mg via INTRAVENOUS

## 2016-08-04 MED ORDER — LIDOCAINE VISCOUS 2 % MT SOLN
OROMUCOSAL | Status: AC
  Filled 2016-08-04: qty 15

## 2016-08-04 MED ORDER — FUROSEMIDE 10 MG/ML IJ SOLN
20.0000 mg | Freq: Once | INTRAMUSCULAR | Status: AC
  Administered 2016-08-04: 20 mg via INTRAVENOUS
  Filled 2016-08-04: qty 2

## 2016-08-04 MED ORDER — MEPERIDINE HCL 100 MG/ML IJ SOLN
INTRAMUSCULAR | Status: AC
  Filled 2016-08-04: qty 2

## 2016-08-04 MED ORDER — BISACODYL 5 MG PO TBEC
5.0000 mg | DELAYED_RELEASE_TABLET | Freq: Every day | ORAL | Status: DC | PRN
Start: 2016-08-04 — End: 2016-08-05
  Administered 2016-08-04: 5 mg via ORAL
  Filled 2016-08-04: qty 1

## 2016-08-04 MED ORDER — BOOST / RESOURCE BREEZE PO LIQD
1.0000 | Freq: Two times a day (BID) | ORAL | Status: DC
  Administered 2016-08-04: 1 via ORAL

## 2016-08-04 MED ORDER — BISACODYL 5 MG PO TBEC
5.0000 mg | DELAYED_RELEASE_TABLET | Freq: Every day | ORAL | Status: DC | PRN
  Filled 2016-08-04: qty 1

## 2016-08-04 MED ORDER — MIDAZOLAM HCL 5 MG/5ML IJ SOLN
INTRAMUSCULAR | Status: AC
  Filled 2016-08-04: qty 10

## 2016-08-04 NOTE — Progress Notes (Signed)
08/04/2016 6:01 PM  Ordered to transfuse 2 more units of PRBC.  Please see specific transfusion orders.  Repeat CBC in AM.    Murvin Natal, MD

## 2016-08-04 NOTE — Care Management (Signed)
    Durable Medical Equipment        Start     Ordered   08/04/16 1440  For home use only DME Walker rolling  Once    Question:  Patient needs a walker to treat with the following condition  Answer:  COPD (chronic obstructive pulmonary disease) ([place])   08/04/16 1439

## 2016-08-04 NOTE — Evaluation (Signed)
Physical Therapy Evaluation Patient Details Name: Charlotte Henry MRN: [id number] DOB: April 22, 1941 Today's Date: 08/04/2016   History of Present Illness  SILVER ACHEY is a 76 y.o. female with medical history significant of pacemaker placement, PVD, ongoing tobacco dependence, h/o TIA, HTN, HLD, h/o peptic ulcer, GERD, DM, CAD, COPD, and CKD presenting with weakness, anorexia.  Patient has been unable to walk since January 9.  She had been on Flagyl and Biaxin.  She began vomiting and was unable to eat, nauseated all the time.  Daughter called Dr. Oneida Alar and she was told to stop it but could no longer walk.  Fainted Jan 21 and has been sick ever since.  Since then, she has lost from 152 to 137 (weight was actually 148 on 1/10 PCP visit and is 139 today).  No appetite.  No dysphagia.  Just doesn't like food.  No n/v.  Ensure is constipating her, took an enema on Monday with improvement.  But Ensure is the only thing that helps her to have the energy to walk.  +dizzy, intermittent fainting.  +wheezing/SOB - chronically.  Has not used inhalers recently.  Numbness in both arms at night when laying on them.  Has had some rashes for hands and neck.  Has refused EGD/colo in the recent past but did 3 recent negative home stool cards.  Clinical Impression  Pt received sitting on BSC with granddaughter in the room and pt was agreeable to PT evaluation. Pt reported that she transferred herself to the [facility] prior to PT entering. Pt able to perform sit <> stand with supv/min guard from [facility] and she was able to ambulate 49f with RW and with min guard due to decreased gait speed and h/o falls/intermittent fainting. Pt only had 1 minor bout of unsteadiness when turning around with the RW, but was able to maintain balance with min guard from Pt. She reports that she did not feel dizzy when walking this date.  Pt states she is starting to feel better but that she still feels weak. PT attempted to get pt to sit up in the chair  for lunch at end of session, but pt states "I'm not eating lunch, I'm having surgery at 2:25 today." Pt was left in bed with call bell and phone within reach and granddaughter at her bedside. Pt refused bed alarm with PT and since she had refused the bed alarm with nursing earlier, PT gave extensive education to both the pt and granddaughter that the pt is still a high fall risk due to her decreased mobility and to call the nurses if she has to get up to use the restroom. Pt and granddaughter both verbalized understanding. Due to pt's decreased mobility and her generalized weakness, pt would benefit from HHPT and a RW since pt has standard walker at home. Pt would also benefit from acute PT services to maximize strength and function.    Follow Up Recommendations Home health PT    Equipment Recommendations  Rolling walker with 5" wheels    Recommendations for Other Services       Precautions / Restrictions Precautions Precautions: Fall Precaution Comments: 2 falls in the last 6 months when she was going to the bathroom and she fainted. Restrictions Weight Bearing Restrictions: No      Mobility  Bed Mobility Overal bed mobility: Modified Independent             General bed mobility comments: increased time to complete task, cues for proper technique  Transfers Overall transfer level: Modified independent Equipment used: Rolling walker (2 wheeled);None             General transfer comment: increased time to complete task, cues for hand placement with RW with stand > sit  Ambulation/Gait Ambulation/Gait assistance: Min guard Ambulation Distance (Feet): 40 Feet Assistive device: Rolling walker (2 wheeled) Gait Pattern/deviations: Decreased step length - right;Decreased step length - left;Decreased stride length   Gait velocity interpretation: <1.8 ft/sec, indicative of risk for recurrent falls General Gait Details: very slow; pt had 1 minor episode of unsteadiness when  adjusting RW during turn but able to maintain balance with min guard from PT  Stairs            Wheelchair Mobility    Modified Rankin (Stroke Patients Only)       Balance Overall balance assessment: Modified Independent Sitting-balance support: Feet supported Sitting balance-Leahy Scale: Good Sitting balance - Comments: sitting on BSC and EOB with no evidences of unsteadiness   Standing balance support: Bilateral upper extremity supported Standing balance-Leahy Scale: Fair Standing balance comment: RW                             Pertinent Vitals/Pain Pain Assessment: No/denies pain    Home Living Family/patient expects to be discharged to:: Private residence Living Arrangements: Alone Available Help at Discharge: Available 24 hours/day;Family Type of Home: House Home Access: Stairs to enter Entrance Stairs-Rails: None Entrance Stairs-Number of Steps: 2 Home Layout: One level Home Equipment: Cane - single point;Shower seat;Grab bars - tub/shower;Walker - standard      Prior Function Level of Independence: Needs assistance   Gait / Transfers Assistance Needed: Pt stated she could "barely" walk with a RW and cane inside the house. She had to use the cane out in the community becuase she couldn't transfer the RW herself. States she hasn't been walking good for the last 2 months.  ADL's / Homemaking Assistance Needed: "Somebody had to stand guard" while she was dressing and bathing.        Hand Dominance   Dominant Hand: Right    Extremity/Trunk Assessment   Upper Extremity Assessment Upper Extremity Assessment: Generalized weakness    Lower Extremity Assessment Lower Extremity Assessment: Generalized weakness       Communication   Communication: No difficulties  Cognition Arousal/Alertness: Awake/alert Behavior During Therapy: WFL for tasks assessed/performed Overall Cognitive Status: Within Functional Limits for tasks assessed                       General Comments      Exercises     Assessment/Plan    PT Assessment Patient needs continued PT services  PT Problem List Decreased strength;Decreased balance;Decreased activity tolerance;Decreased mobility;Decreased safety awareness       PT Treatment Interventions Gait training;Stair training;Functional mobility training;Therapeutic activities;Therapeutic exercise;Patient/family education    PT Goals (Current goals can be found in the Care Plan section)  Acute Rehab PT Goals Patient Stated Goal: to go home PT Goal Formulation: With patient Time For Goal Achievement: 08/07/16 Potential to Achieve Goals: Good    Frequency Min 3X/week   Barriers to discharge        Co-evaluation               End of Session Equipment Utilized During Treatment: Gait belt Activity Tolerance: Patient tolerated treatment well;No increased pain Patient left: in bed;with call bell/phone within reach;with  family/visitor present Nurse Communication: Mobility status;Precautions PT Visit Diagnosis: Muscle weakness (generalized) (M62.81);Difficulty in walking, not elsewhere classified (R26.2);Unsteadiness on feet (R26.81)    Functional Assessment Tool Used: AM-PAC 6 Clicks Basic Mobility;Clinical judgement Functional Limitation: Mobility: Walking and moving around Mobility: Walking and Moving Around Current Status (Y0349): At least 40 percent but less than 60 percent impaired, limited or restricted Mobility: Walking and Moving Around Goal Status [phone]): At least 1 percent but less than 20 percent impaired, limited or restricted    Time: 1000-1023 PT Time Calculation (min) (ACUTE ONLY): 23 min   Charges:   PT Evaluation $PT Eval Low Complexity: 1 Procedure PT Treatments $Gait Training: 8-22 mins   PT G Codes:   PT G-Codes **NOT FOR INPATIENT CLASS** Functional Assessment Tool Used: AM-PAC 6 Clicks Basic Mobility;Clinical judgement Functional Limitation: Mobility:  Walking and moving around Mobility: Walking and Moving Around Current Status (H5391): At least 40 percent but less than 60 percent impaired, limited or restricted Mobility: Walking and Moving Around Goal Status [phone]): At least 1 percent but less than 20 percent impaired, limited or restricted      Geraldine Solar PT, DPT

## 2016-08-04 NOTE — Progress Notes (Signed)
Initial Nutrition Assessment  DOCUMENTATION CODES:  Severe malnutrition in context of acute illness/injury   Pt meets criteria for SEVERE MALNUTRITION in the context of ACUTE ILLNESS as evidenced by Loss of >5% bw in 1 month and moderate muscle depletion.   INTERVENTION:  Once diet is advanced, Glucerna Shake po BID, each supplement provides 220 kcal and 10 grams of protein  MVI with minerals  NUTRITION DIAGNOSIS:  Inadequate oral intake related to poor appetite, Weakness as evidenced by loss of >5% bw x 1 month  GOAL:  Patient will meet greater than or equal to 90% of their needs  MONITOR:  PO intake, Supplement acceptance, Diet advancement, Labs  REASON FOR ASSESSMENT:  Consult Assessment of nutrition requirement/status  ASSESSMENT:  76 y/o female PMHx pacemaker, tobacco abuse, TIA, HTN, PUD, GERD, DM, CAD, COPD, CKD3. Presents with weakness and anorexia. Reported being unable to walk since January 9th and has been vomiting or nauseated all the time. Poor appetite-doesn't like food. Has lost ~10 lbs in last month.   Pt is a currently a very poor historian secondary to lethargy/confusion following her colonoscopy.   Family member at bedside describes the pt intake as "consistently inconsistent". Pt's meal intake would vary day by day. It sounds that sometimes she eat would eat 3 meals and other days not eat a single meal. She says she would drink Ensure, but later states she will not drink Ensure because it constipates her.   She says her UBW was ~ 160 lbs. She says she was 157 in January and 152 lbs on Feb 1st. None of this information is consistent with chart review. Per objective information, Prior to these last few months, wt had been stable at 145-155 lbs since mid 2016. Seems her wt truly started declining in mid February.   At this time, pt remains on CL diet. Had inadequate bowel prep for colonoscopy and will need to have a repeat procedure tomorrow. Will order Boost  Breeze and switch to glucerna once diet advanced  NFPE: Gastrocnemius feels well nourished. Exhibits moderate muscle wasting of temporalis and clavicular musculature. Mild muscle wasting deltoids and mild fat wasting of triceps.   Labs: BUN/Creat:47/1.08, h/h:7.9/23.5, A1C 1 month ago was 5.6 Medications: PPI, IVF   Recent Labs Lab 08/02/16 1520 08/03/16 0509  NA 138 138  K 4.4 4.2  CL 106 108  CO2 26 24  BUN 54* 47*  CREATININE 1.20* 1.08*  CALCIUM 9.4 8.5*  GLUCOSE 133* 98   Diet Order:  Diet clear liquid Room service appropriate? Yes; Fluid consistency: Thin  Skin:  Reviewed, no issues  Last BM:  3/2  Height:  Ht Readings from Last 1 Encounters:  08/02/16 '5\' 7"'$  (1.702 m)   Weight:  Wt Readings from Last 1 Encounters:  08/02/16 139 lb 6.4 oz (63.2 kg)   Wt Readings from Last 10 Encounters:  08/02/16 139 lb 6.4 oz (63.2 kg)  08/02/16 137 lb 3.2 oz (62.2 kg)  07/26/16 139 lb 12.8 oz (63.4 kg)  07/11/16 149 lb 12.8 oz (67.9 kg)  07/07/16 148 lb (67.1 kg)  07/06/16 148 lb (67.1 kg)  07/04/16 146 lb (66.2 kg)  07/03/16 146 lb 3.2 oz (66.3 kg)  06/28/16 148 lb (67.1 kg)  06/14/16 148 lb (67.1 kg)   Ideal Body Weight:  61.36 kg  BMI:  Body mass index is 21.83 kg/m.  Estimated Nutritional Needs:  Kcal:  1750-1950kcals (28-31 kcal/kg bw) Protein:  82-95 g Pro (1.3-1.5 g/kg bw) Fluid:  >  1.6 L (25 ml/kg bw)  EDUCATION NEEDS:  No education needs identified at this time  Burtis Junes RD, LDN, [facility] Pager: [phone] 08/04/2016 4:50 PM

## 2016-08-04 NOTE — Interval H&P Note (Signed)
History and Physical Interval Note:  08/04/2016 1:44 PM  Charlotte Henry  has presented today for surgery, with the diagnosis of Symptomatic anemia  The various methods of treatment have been discussed with the patient and family. After consideration of risks, benefits and other options for treatment, the patient has consented to  Procedure(s): COLONOSCOPY (N/A) ESOPHAGOGASTRODUODENOSCOPY (EGD) (N/A) as a surgical intervention .  The patient's history has been reviewed, patient examined, no change in status, stable for surgery.  I have reviewed the patient's chart and labs.  Questions were answered to the patient's satisfaction.     [employer]

## 2016-08-04 NOTE — Op Note (Signed)
[facility] Patient Name: Charlotte Henry Procedure Date: 08/04/2016 2:07 PM MRN: [id number] Date of Birth: Dec 07, 1940 Attending MD: Barney Drain , MD CSN: [id number] Age: 76 Admit Type: Inpatient Procedure:                Colonoscopy, INCOMPLETE Indications:              Gastrointestinal bleeding, Iron deficiency anemia                            secondary to chronic blood loss Providers:                Barney Drain, MD, Janeece Riggers, RN, Purcell Nails. [place],                            Merchant navy officer Referring MD:             Modena Nunnery. [place] Medicines:                Meperidine 25 mg IV, Midazolam 2 mg IV Complications:            No immediate complications. Estimated Blood Loss:     Estimated blood loss: none. Procedure:                Pre-Anesthesia Assessment:                           - Prior to the procedure, a History and Physical                            was performed, and patient medications and                            allergies were reviewed. The patient's tolerance of                            previous anesthesia was also reviewed. The risks                            and benefits of the procedure and the sedation                            options and risks were discussed with the patient.                            All questions were answered, and informed consent                            was obtained. Prior Anticoagulants: The patient has                            taken Xarelto (rivaroxaban), last dose was 4 days                            prior to procedure. ASA Grade Assessment: III - A  patient with severe systemic disease. After                            reviewing the risks and benefits, the patient was                            deemed in satisfactory condition to undergo the                            procedure. After obtaining informed consent, the                            colonoscope was passed under direct vision.              Throughout the procedure, the patient's blood                            pressure, pulse, and oxygen saturations were                            monitored continuously. The [id number] ([id number])                            scope was introduced through the anus and advanced                            to the the transverse colon. The colonoscopy was                            technically difficult and complex due to inadequate                            bowel prep and a tortuous colon. The patient                            tolerated the procedure fairly well. The quality of                            the bowel preparation was poor. The rectum was                            photographed. Scope In: 2:31:55 PM Scope Out: 2:37:47 PM Total Procedure Duration: 0 hours 5 minutes 52 seconds  Findings:      The recto-sigmoid colon and sigmoid colon were moderately redundant. Impression:               - Preparation of the colon was poor.                           - Redundant colon. Moderate Sedation:      Moderate (conscious) sedation was administered by the endoscopy nurse       and supervised by the endoscopist. The following parameters were       monitored: oxygen saturation, heart rate, blood pressure, and response       to care.  Total physician intraservice time was 38 minutes. Recommendation:           - Return patient to hospital ward.                           - Clear liquid diet.                           - Continue present medications.                           - Repeat colonoscopy tomorrow because the bowel                            preparation was poor. Procedure Code(s):        --- Professional ---                           [phone], 47, Colonoscopy, flexible; diagnostic,                            including collection of specimen(s) by brushing or                            washing, when performed (separate procedure)                           99152, Moderate sedation services  provided by the                            same physician or other qualified health care                            professional performing the diagnostic or                            therapeutic service that the sedation supports,                            requiring the presence of an independent trained                            observer to assist in the monitoring of the                            patient's level of consciousness and physiological                            status; initial 15 minutes of intraservice time,                            patient age 17 years or older                           [phone], Moderate sedation services; each additional  15 minutes intraservice time                           99153, Moderate sedation services; each additional                            15 minutes intraservice time Diagnosis Code(s):        --- Professional ---                           K92.2, Gastrointestinal hemorrhage, unspecified                           D50.0, Iron deficiency anemia secondary to blood                            loss (chronic)                           Q43.8, Other specified congenital malformations of                            intestine CPT copyright 2016 American Medical Association. All rights reserved. The codes documented in this report are preliminary and upon coder review may  be revised to meet current compliance requirements. Barney Drain, MD Barney Drain, MD 08/04/2016 3:17:36 PM This report has been signed electronically. Number of Addenda: 0

## 2016-08-04 NOTE — Care Management Note (Signed)
Case Management Note  Patient Details  Name: Charlotte Henry MRN: [id number] Date of Birth: 1940-06-24  Expected Discharge Date:  08/04/16               Expected Discharge Plan:  [place]  In-House Referral:  NA  Discharge planning Services  CM Consult  Post Acute Care Choice:  Home Health Choice offered to:  Patient  DME Arranged:   [facility]assie Moselle  DME Agency:    [facility]  HH Arranged:  PT [place] Agency:  [facility]  Status of Service:  Completed, signed off  Additional Comments: PT has recommended HH PT and RW. Pt agreeable. Pt has chosen [facility] from list of [place] and DME providers. Sunday Corn, of [facility], aware of referral and will obtain pt info from chart and deliver DME to pt room prior to DC. Pt aware HH has 48hrs to make first visit.  Sherald Barge, RN 08/04/2016, 2:35 PM

## 2016-08-04 NOTE — Progress Notes (Signed)
Pt completed [place], BM's are regular but stool continues to be dark, spoke with Dr. Nona Dell to make her aware, told to proceed with scheduled tap water enema's and monitor. Will inform oncoming RN of situation.

## 2016-08-04 NOTE — H&P (View-Only) (Signed)
Subjective:    Patient ID: Charlotte Henry, female    DOB: [birth year], 76 y.o.   MRN: [id number]  [name], MD  HPI Took ABX AND had side effects. Been down for past. DR. Buelah Manis FOUND SHE WAS DEHYDRATED AND WOULDN'T EAT. Drinking ensure. Major problem now is weakness and no energy. LEGS WON'T HARDLY MOVE. WAS UNABLE TO WALK THEN ON A WALKER,  AND NOW ON A CANE. STRENGTH SLOWLY COMING BACK  BUT NOT BACK TO [place]. PYLERA MADE HER SICK AND THROWING UP. DID NOT FINISH IT BUT MISSED ONLY 2 DAYS. ONLY NEW MEDS TO TAKE WIND OUT OF HER SAILS: FLAGYL/BIAXIN. STARTED TO PHYSICAL THERAPY 1-2 WEEKS AGO.  ATE YESTERDAY: SAUSAGE BISCUIT/COFFEE, MEATLOAF/CREAMED POTATOES. APPETITE COMING BACK BUT SLOW. ENSURE TID. PT C/O CONSTIPATED. DID AN ENEMA ON SUN. HAS AMITIZA BUT SCARED TO TAKE IT BECAUSE IT WORKS TOO WELL. MIRALAX AND LINZESS ALSO CAUSE RAGING DIARRHEA. OCCASIONAL NAUSEA. NO VOMITING FOR PAST 2 WEEKS. STAYS COLD. SAW ONE TIME: STREAKS BLOOD IN VOMIT.   PT DENIES FEVER, CHILLS, HEMATOCHEZIA, HEMATEMESIS, vomiting, melena, diarrhea, CHEST PAIN, SHORTNESS OF BREATH, CHANGE IN BOWEL IN HABITS, abdominal pain, problems swallowing, OR heartburn or indigestion.  Past Medical History:  Diagnosis Date  . 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 ASCVD (arteriosclerotic cardiovascular disease)    a. cath in 4/04,-80%LAD; 70% RCA, -> DESx2; residual 60% distal RCA; nl EF  b. Nuc 02/2016 aborted due to bradycardia. Will need to be rescheduled   . Carotid artery occlusion   . Cerebrovascular disease   . CHB (complete heart block) ([place])    a. s/p PPM on 03/24/16 with a St. Jude (serial number [id number]) pacemaker  . Chronic kidney disease (CKD), stage III (moderate)   . Clostridium difficile colitis 03/2013   a. 03/2013  . COPD (chronic obstructive pulmonary disease) ([place])   . Coronary disease   . Diabetes mellitus   . Diverticulosis   . GERD (gastroesophageal reflux disease)   . Gout   . H pylori  ulcer   . Hyperlipidemia   . Hypertension   . IBS (irritable bowel syndrome)   . PVD (peripheral vascular disease) ([place])    a. moderate to severely decreased ABI's in 8/08, sig. aortic inflow disease b. repeat ABI's in 2011 improved- mild disease on the left and moderate to severe disease on the right  . S/P placement of cardiac pacemaker    a. 03/24/16: St. Jude (serial number [id number]) pacemaker  . TIA (transient ischemic attack) 2012  . Tobacco abuse    a. 50 pack years continuing at 1/2 pack per day    Past Surgical History:  Procedure Laterality Date  . ABDOMINAL HYSTERECTOMY    . carpel tunnel release     bilateral  . CHOLECYSTECTOMY    . COLONOSCOPY  2009  . EP IMPLANTABLE DEVICE N/A 03/24/2016   Procedure: Pacemaker Implant;  Surgeon: [name], MD;  Location: [facility];  Service: Cardiovascular;  Laterality: N/A;  . TOTAL ABDOMINAL HYSTERECTOMY W/ BILATERAL SALPINGOOPHORECTOMY     Allergies  Allergen Reactions  . Ace Inhibitors Hives, Shortness Of Breath and Swelling  . Aspirin Other (See Comments)    Blood in stool and nose bleed  . Codeine Swelling  . Pineapple Shortness Of Breath and Swelling  . Plasticized Base [Plastibase] Hives and Swelling    No plastics  . Ultram [Tramadol] Swelling  . Adhesive [Tape] Swelling and Rash  . Naprosyn [Naproxen] Swelling  .  Biaxin [Clarithromycin] Nausea And Vomiting and Other (See Comments)    Dizziness.  . Clindamycin/Lincomycin Other (See Comments)    Burning sensation throat, abdomen  . Linzess [Linaclotide]     DIARRHEA, FECAL INCONTINENCE  . Metronidazole     NAUSEA AND WEAKNESS  . Nsaids   . Penicillins       . Varenicline Tartrate Swelling    Current Outpatient Prescriptions  Medication Sig Dispense Refill  . acetaminophen (TYLENOL) 500 MG tablet Take 500 mg by mouth every 6 (six) hours as needed for mild pain or moderate pain.    Marland Kitchen albuterol (PROVENTIL HFA;VENTOLIN HFA) 108 (90 Base) MCG/ACT  inhaler Inhale 2 puffs into the lungs every 4 (four) hours as needed for wheezing or shortness of breath.    . ALPRAZolam (XANAX) 0.25 MG tablet Take 1 tablet (0.25 mg total) by mouth at bedtime as needed.    . calcium citrate-vitamin D (CITRACAL+D) 315-200 MG-UNIT per tablet Take 1 tablet by mouth daily.      . Cranberry 250 MG CAPS Take 250 mg by mouth daily.    . Cyanocobalamin (VITAMIN B 12 PO) Take 1 tablet by mouth daily.     Marland Kitchen esomeprazole (NEXIUM) 40 MG capsule Take 1 capsule (40 mg total) by mouth 2 (two) times daily before a meal. FOR 14 DAYS    . Fluticasone-Salmeterol (ADVAIR) 100-50 MCG/DOSE AEPB Inhale 1 puff into the lungs 2 (two) times daily. (Patient taking differently: Inhale 1 puff into the lungs 2 (two) times daily as needed (Shortness of breath). )    . gabapentin (NEURONTIN) 100 MG capsule Take 1-2 capsules at bedtime for nerve pain (Patient taking differently: Take 100 mg by mouth at bedtime. Take 1-2 capsules at bedtime for nerve pain)    . mirabegron ER (MYRBETRIQ) 25 MG TB24 tablet Take 1 tablet (25 mg total) by mouth at bedtime. For bladder PRN   . Probiotic Product (PROBIOTIC DAILY PO) Take 1 capsule by mouth daily.     . promethazine 12.5 MG tablet 1-2 po 30 MINS PRIOR TO MEDS OR MEALS. MAY REPEAT EVERY 4-6 h prn nausea or vomiting LAST NIGHT   . rivaroxaban (XARELTO) 20 MG TABS tablet Take 1 tablet (20 mg total) by mouth daily.    . Tiotropium Bromide Monohydrate (SPIRIVA RESPIMAT) 1.25 MCG/ACT AERS Inhale 2 puffs into the lungs daily.    .      . ZETIA 10 MG tablet TAKE 1 TABLET BY MOUTH ONCE A DAY.    Marland Kitchen       Review of Systems PER HPI OTHERWISE ALL SYSTEMS ARE NEGATIVE.    Objective:   Physical Exam  Constitutional: She is oriented to person, place, and time. She appears well-developed and well-nourished. No distress.  HENT:  Head: Normocephalic and atraumatic.  Mouth/Throat: Oropharynx is clear and moist. No oropharyngeal exudate.  Eyes: Pupils are equal,  round, and reactive to light. No scleral icterus.  Neck: Normal range of motion. Neck supple.  Cardiovascular: Normal rate, regular rhythm and normal heart sounds.   Pulmonary/Chest: Effort normal and breath sounds normal. No respiratory distress.  Abdominal: Soft. Bowel sounds are normal. She exhibits no distension. There is no tenderness.  Musculoskeletal: She exhibits no edema.  Lymphadenopathy:    She has no cervical adenopathy.  Neurological: She is alert and oriented to person, place, and time.  ABLE TO GET ONE EXAM TABLE WITHOUT ASSISTANCE to ambulate with assistance and get on exam table without assistance. WALKS ASSISTED WITH  A CANE. SLOW BUT STEADY GAIT.  Psychiatric:  SLIGHTLY DEPRESSED MOOD, FLAT AFFECT INITIALLY BUT NL AFTER INTERACTION   Vitals reviewed.     Assessment & Plan:

## 2016-08-04 NOTE — Progress Notes (Addendum)
CRITICAL VALUE ALERT  Critical value received:  Hemoglobin 6.3  Date of notification:  08/04/16  Time of notification:  17:45  Critical value read back:yes  Nurse who received alert:  Threasa Alpha, RN  MD notified (1st page):  Dr. Wynetta Emery  Time of first page:  17:55  Responding MD:  Dr. Wynetta Emery  Time MD responded:  1800

## 2016-08-04 NOTE — Care Management Important Message (Signed)
Important Message  Patient Details  Name: Charlotte Henry MRN: [id number] Date of Birth: May 26, 1941   Medicare Important Message Given:  Yes    Sherald Barge, RN 08/04/2016, 9:35 AM

## 2016-08-04 NOTE — Progress Notes (Signed)
PROGRESS NOTE    ELLAYNA HILLIGOSS  AOZ:[id number]  DOB: 1941-01-02  DOA: 08/02/2016 PCP: [name], MD Outpatient Specialists:  Hospital course: DAWNN NAM is a 76 y.o. female with medical history significant of pacemaker placement, PVD, ongoing tobacco dependence, h/o TIA, HTN, HLD, h/o peptic ulcer, GERD, DM, CAD, COPD, and CKD presenting with weakness, anorexia.  Patient has been unable to walk since January 9.  She had been on Flagyl and Biaxin.  She began vomiting and was unable to eat, nauseated all the time.  Daughter called Dr. Oneida Alar and she was told to stop it but could no longer walk.  Fainted Jan 21 and has been sick ever since.  Since then, she has lost from 152 to 137 (weight was actually 148 on 1/10 PCP visit and is 139 today).  No appetite.  No dysphagia.  Just doesn't like food.  No n/v.  Ensure is constipating her, took an enema on Monday with improvement.  But Ensure is the only thing that helps her to have the energy to walk.  +dizzy, intermittent fainting.  +wheezing/SOB - chronically.  Has not used inhalers recently.  Numbness in both arms at night when laying on them.  Has had some rashes for hands and neck.  Has refused EGD/colo in the recent past but did 3 recent negative home stool cards.  Assessment & Plan:   Anemia - secondary to GI bleed, confirmed with multiple Black stools noted from bowel prep for colonoscopy.  It is planned to be done this afternoon by Dr Oneida Alar.  Holding Xarelto for now.  S/p 2 units PRBC and Pt feeling better after blood transfusion and Hg now >7.    Hypertension - stable, some soft BPs noted and holding her home meds  CKD - stable, following.   Tobacco Dependence - cessation counseled at bedside.  Pt verbalized understanding.   Severe protein cal malnutrition - consult to dietitian pending.    Diabetes Mellitus, type 2 Follow BS Stable at this time Diet controlled  DVT prophylaxis:  SCDS Code Status: full  Family  Communication: family at bedside Disposition Plan: TBD  Consultants: GI  Procedures:  EGD/colonoscopy pending  Subjective: Pt having multiple black tarry stools with colonoscopy prep   Objective: Vitals:   08/03/16 1425 08/04/16 0000 08/04/16 0607 08/04/16 0807  BP: 126/62 (!) 99/51 113/69   Pulse: 60 60 70   Resp: '18 16 16   '$ Temp: 99.2 F (37.3 C) 97.9 F (36.6 C) 98.4 F (36.9 C)   TempSrc:  Oral Oral   SpO2: 100% 100% 99% 97%  Weight:      Height:        Intake/Output Summary (Last 24 hours) at 08/04/16 1022 Last data filed at 08/03/16 1900  Gross per 24 hour  Intake              360 ml  Output              800 ml  Net             -440 ml   Filed Weights   08/02/16 1309 08/02/16 1312 08/02/16 1834  Weight: 62.1 kg (137 lb) 62.1 kg (137 lb) 63.2 kg (139 lb 6.4 oz)    Exam:  General exam: awake, alert, NAD.   Respiratory system:  No increased work of breathing. Cardiovascular system: S1 & S2 heard. Gastrointestinal system: Abdomen is nondistended, soft and nontender. Normal bowel sounds heard. Central nervous system: Alert and  oriented. No focal neurological deficits. Extremities: no cyanosis.    Data Reviewed: Basic Metabolic Panel:  Recent Labs Lab 08/02/16 1520 08/03/16 0509  NA 138 138  K 4.4 4.2  CL 106 108  CO2 26 24  GLUCOSE 133* 98  BUN 54* 47*  CREATININE 1.20* 1.08*  CALCIUM 9.4 8.5*   Liver Function Tests:  Recent Labs Lab 08/02/16 1520  AST 24  ALT 16  ALKPHOS 33*  BILITOT 0.3  PROT 6.8  ALBUMIN 3.6    Recent Labs Lab 08/02/16 1520  LIPASE 10*   No results for input(s): AMMONIA in the last 168 hours. CBC:  Recent Labs Lab 08/02/16 1520 08/03/16 0509  WBC 11.1* 9.5  NEUTROABS 7.4  --   HGB 5.9* 7.9*  HCT 17.5* 23.5*  MCV 89.7 86.7  PLT 246 187   Cardiac Enzymes:  Recent Labs Lab 08/02/16 1520  TROPONINI <0.03   CBG (last 3)  No results for input(s): GLUCAP in the last 72 hours. No results found for  this or any previous visit (from the past 240 hour(s)).   Studies: Dg Chest 2 View  Result Date: 08/02/2016 CLINICAL DATA:  Weakness, dehydration, and poor p.o. intake. EXAM: CHEST  2 VIEW COMPARISON:  06/28/2016 FINDINGS: Pacemaker remains in place with leads terminating over the right atrium and right ventricle. Cardiac silhouette is normal in size. Tortuosity and calcification are again noted involving the thoracic aorta. The patient's hand partially obscures the left lung base on the AP radiograph. No airspace consolidation, edema, pleural effusion, or pneumothorax is identified. Mild thoracic spondylosis is noted. IMPRESSION: 1. No active cardiopulmonary disease. 2. Aortic atherosclerosis. Electronically Signed   By: Logan Bores M.D.   On: 08/02/2016 16:24   Scheduled Meds: . ezetimibe  10 mg Oral Daily  . gabapentin  100 mg Oral QHS  . pantoprazole  40 mg Intravenous Q12H  . pravastatin  20 mg Oral q1800  . tiotropium  1 capsule Inhalation Daily   Continuous Infusions: . sodium chloride 100 mL/hr at 08/03/16 2234    Principal Problem:   Symptomatic anemia Active Problems:   Diabetes mellitus ([facility])   Essential hypertension   Gastroesophageal reflux disease   Chronic kidney disease (CKD), stage III (moderate)   Tobacco use disorder   Weight loss, unintentional  Time spent:   Irwin Brakeman, MD, FAAFP [facility] Pager [phone]  If 7PM-7AM, please contact night-coverage [email or web address] Password [email or web address] 08/04/2016, 10:22 AM    LOS: 2 days

## 2016-08-04 NOTE — Progress Notes (Signed)
Pt claims to have completed first half of Golytely during day shift with no bowel movements following, pt c/o nausea during first half of Golytely. Provided pt with antiemetic before starting second half of Golytely on night shift and ensured that pt completed this portion. Pt immediately began having bowel movements with second portion. Will continue to monitor.

## 2016-08-04 NOTE — Op Note (Signed)
[facility] Patient Name: Charlotte Henry Procedure Date: 08/04/2016 2:43 PM MRN: [id number] Date of Birth: 04-06-1941 Attending MD: Barney Drain , MD CSN: [id number] Age: 76 Admit Type: Inpatient Procedure:                Upper GI endoscopy WITH COLD FORCEPS BIOPSY Indications:              Gastrointestinal bleeding of unknown origin-PMHx: H                            PYLORI GASTRITIS Providers:                Barney Drain, MD, Janeece Riggers, RN, [facility],                            Technician Referring MD:             Modena Nunnery. [place] Medicines:                TCS + Meperidine 25 mg IV, Midazolam 1 mg IV Complications:            No immediate complications. Estimated Blood Loss:     Estimated blood loss was minimal. Procedure:                Pre-Anesthesia Assessment:                           - Prior to the procedure, a History and Physical                            was performed, and patient medications and                            allergies were reviewed. The patient's tolerance of                            previous anesthesia was also reviewed. The risks                            and benefits of the procedure and the sedation                            options and risks were discussed with the patient.                            All questions were answered, and informed consent                            was obtained. Prior Anticoagulants: The patient has                            taken Xarelto (rivaroxaban), last dose was 4 days                            prior to procedure. ASA Grade Assessment: III - A  patient with severe systemic disease. After                            reviewing the risks and benefits, the patient was                            deemed in satisfactory condition to undergo the                            procedure. After obtaining informed consent, the                            endoscope was passed under direct vision.                            Throughout the procedure, the patient's blood                            pressure, pulse, and oxygen saturations were                            monitored continuously. The EG-[id number] ([id number])                            scope was introduced through the mouth, and                            advanced to the second part of duodenum. The upper                            GI endoscopy was accomplished without difficulty.                            The patient tolerated the procedure well. Scope In: 2:44:54 PM Scope Out: 2:50:13 PM Total Procedure Duration: 0 hours 5 minutes 19 seconds  Findings:      A non-obstructing Schatzki ring (acquired) was found at the       gastroesophageal junction.      A small hiatal hernia was present.      Patchy mild inflammation characterized by congestion (edema) and       erythema was found in the gastric antrum. Biopsies were taken with a       cold forceps for Helicobacter pylori testing.      The examined duodenum was normal. Impression:               - Non-obstructing Schatzki ring.                           - Small hiatal hernia.                           - Gastritis. Biopsied.                           - NO SOURCE FOR ANEMIA IDENTIFIED Moderate Sedation:      Moderate (conscious) sedation  was administered by the endoscopy nurse       and supervised by the endoscopist. The following parameters were       monitored: oxygen saturation, heart rate, blood pressure, and response       to care. Total physician intraservice time was 38 minutes. Recommendation:           - Await pathology results.                           - Clear liquid diet.                           - Continue present medications.                           - Await pathology results.                           - Return patient to hospital ward. Procedure Code(s):        --- Professional ---                           [phone], Esophagogastroduodenoscopy, flexible,                             transoral; with biopsy, single or multiple                           99152, Moderate sedation services provided by the                            same physician or other qualified health care                            professional performing the diagnostic or                            therapeutic service that the sedation supports,                            requiring the presence of an independent trained                            observer to assist in the monitoring of the                            patient's level of consciousness and physiological                            status; initial 15 minutes of intraservice time,                            patient age 82 years or older                           [phone], Moderate sedation services; each additional  15 minutes intraservice time                           99153, Moderate sedation services; each additional                            15 minutes intraservice time Diagnosis Code(s):        --- Professional ---                           K22.2, Esophageal obstruction                           K44.9, Diaphragmatic hernia without obstruction or                            gangrene                           K29.70, Gastritis, unspecified, without bleeding                           K92.2, Gastrointestinal hemorrhage, unspecified CPT copyright 2016 American Medical Association. All rights reserved. The codes documented in this report are preliminary and upon coder review may  be revised to meet current compliance requirements. Barney Drain, MD Barney Drain, MD 08/04/2016 3:21:39 PM This report has been signed electronically. Number of Addenda: 0

## 2016-08-05 ENCOUNTER — Encounter (HOSPITAL_COMMUNITY)
Admission: EM | Disposition: A | Discharge status: Home / Self Care | Payer: Self-pay | Source: Home / Self Care | Attending: Family Medicine

## 2016-08-05 ENCOUNTER — Encounter (HOSPITAL_COMMUNITY): Payer: Self-pay | Admitting: *Deleted

## 2016-08-05 DIAGNOSIS — E43 Unspecified severe protein-calorie malnutrition: Secondary | ICD-10-CM

## 2016-08-05 DIAGNOSIS — K639 Disease of intestine, unspecified: Secondary | ICD-10-CM

## 2016-08-05 HISTORY — PX: COLONOSCOPY: SHX5424

## 2016-08-05 LAB — CBC WITH DIFFERENTIAL/PLATELET
Basophils Absolute: 0 10*3/uL (ref 0.0–0.1)
Basophils Relative: 0 %
EOS ABS: 0.2 10*3/uL (ref 0.0–0.7)
Eosinophils Relative: 2 %
HCT: 27.6 % — ABNORMAL LOW (ref 36.0–46.0)
HEMOGLOBIN: 9.3 g/dL — AB (ref 12.0–15.0)
LYMPHS ABS: 2.4 10*3/uL (ref 0.7–4.0)
LYMPHS PCT: 28 %
MCH: 29.6 pg (ref 26.0–34.0)
MCHC: 33.7 g/dL (ref 30.0–36.0)
MCV: 87.9 fL (ref 78.0–100.0)
Monocytes Absolute: 0.6 10*3/uL (ref 0.1–1.0)
Monocytes Relative: 6 %
NEUTROS ABS: 5.5 10*3/uL (ref 1.7–7.7)
Neutrophils Relative %: 64 %
Platelets: 151 10*3/uL (ref 150–400)
RBC: 3.14 MIL/uL — AB (ref 3.87–5.11)
RDW: 15.9 % — ABNORMAL HIGH (ref 11.5–15.5)
WBC: 8.6 10*3/uL (ref 4.0–10.5)

## 2016-08-05 LAB — BPAM RBC
Blood Product Expiration Date: 201803232359
Blood Product Expiration Date: 201803232359
ISSUE DATE / TIME: 201803022001
ISSUE DATE / TIME: 201803022343
UNIT TYPE AND RH: 5100
Unit Type and Rh: 5100

## 2016-08-05 LAB — TYPE AND SCREEN
ABO/RH(D): O POS
Antibody Screen: NEGATIVE
UNIT DIVISION: 0
Unit division: 0

## 2016-08-05 LAB — PREPARE RBC (CROSSMATCH)

## 2016-08-05 SURGERY — COLONOSCOPY
Anesthesia: Moderate Sedation

## 2016-08-05 MED ORDER — MIDAZOLAM HCL 5 MG/5ML IJ SOLN
INTRAMUSCULAR | Status: DC | PRN
  Administered 2016-08-05: 1 mg via INTRAVENOUS
  Administered 2016-08-05: 2 mg via INTRAVENOUS

## 2016-08-05 MED ORDER — ADULT MULTIVITAMIN W/MINERALS CH: 1.0000 | ORAL_TABLET | Freq: Every day | ORAL | Status: AC

## 2016-08-05 MED ORDER — SODIUM CHLORIDE 0.9 % IV SOLN: INTRAVENOUS | Status: DC

## 2016-08-05 MED ORDER — BOOST / RESOURCE BREEZE PO LIQD
1.0000 | Freq: Two times a day (BID) | ORAL | 0 refills | Status: DC
Start: 2016-08-05 — End: 2016-08-09

## 2016-08-05 MED ORDER — GLUCERNA SHAKE PO LIQD: 237.0000 mL | Freq: Two times a day (BID) | ORAL | 0 refills | Status: AC

## 2016-08-05 MED ORDER — MEPERIDINE HCL 100 MG/ML IJ SOLN
INTRAMUSCULAR | Status: AC
  Filled 2016-08-05: qty 2

## 2016-08-05 MED ORDER — MEPERIDINE HCL 100 MG/ML IJ SOLN
INTRAMUSCULAR | Status: DC | PRN
  Administered 2016-08-05: 25 mg via INTRAVENOUS

## 2016-08-05 MED ORDER — MIDAZOLAM HCL 5 MG/5ML IJ SOLN
INTRAMUSCULAR | Status: AC
  Filled 2016-08-05: qty 10

## 2016-08-05 MED ORDER — SODIUM CHLORIDE 0.9 % IV SOLN: Freq: Once | INTRAVENOUS | Status: DC

## 2016-08-05 NOTE — Progress Notes (Signed)
PROGRESS NOTE    Charlotte Henry  PQZ:[id number]  DOB: November 30, 1940  DOA: 08/02/2016 PCP: [name], MD Outpatient Specialists:  Hospital course: Charlotte Henry is a 76 y.o. female with medical history significant of pacemaker placement, PVD, ongoing tobacco dependence, h/o TIA, HTN, HLD, h/o peptic ulcer, GERD, DM, CAD, COPD, and CKD presenting with weakness, anorexia.  Patient has been unable to walk since January 9.  She had been on Flagyl and Biaxin.  She began vomiting and was unable to eat, nauseated all the time.  Daughter called Dr. Oneida Alar and she was told to stop it but could no longer walk.  Fainted Jan 21 and has been sick ever since.  Since then, she has lost from 152 to 137 (weight was actually 148 on 1/10 PCP visit and is 139 today).  No appetite.  No dysphagia.  Just doesn't like food.  No n/v.  Ensure is constipating her, took an enema on Monday with improvement.  But Ensure is the only thing that helps her to have the energy to walk.  +dizzy, intermittent fainting.  +wheezing/SOB - chronically.  Has not used inhalers recently.  Numbness in both arms at night when laying on them.  Has had some rashes for hands and neck.  Has refused EGD/colo in the recent past but did 3 recent negative home stool cards.  Assessment & Plan:   Anemia - secondary to GI bleed, confirmed with multiple Black stools noted from bowel prep for colonoscopy.  It is planned to be done this afternoon by Dr Oneida Alar.  Holding Xarelto for now.  S/p 4 units PRBCs.   Pt for colonoscopy this morning with GI.   Further management pending results of GI evaluation.    Hypertension - stable, some soft BPs noted and holding her home meds  CKD - stable, following.   Tobacco Dependence - cessation counseled at bedside.  Pt verbalized understanding.   Severe protein cal malnutrition - consult to dietitian pending.    Diabetes Mellitus, type 2 Follow BS Stable at this time Diet controlled  DVT prophylaxis:   SCDS Code Status: full  Family Communication: family at bedside Disposition Plan: TBD  Consultants: GI  Procedures:  EGD/colonoscopy pending  Subjective: Pt still having multiple black tarry stools with colonoscopy prep   Objective: Vitals:   08/05/16 0406 08/05/16 0750 08/05/16 0853 08/05/16 0930  BP: 109/67  (!) 93/56 104/61  Pulse: (!) 59  60 (!) 58  Resp: '20  18 18  '$ Temp: 98.3 F (36.8 C)  98.3 F (36.8 C) 98.1 F (36.7 C)  TempSrc: Oral  Oral Oral  SpO2: 100% 96% 100% 100%  Weight:      Height:        Intake/Output Summary (Last 24 hours) at 08/05/16 1105 Last data filed at 08/05/16 0930  Gross per 24 hour  Intake             2332 ml  Output             1800 ml  Net              532 ml   Filed Weights   08/02/16 1309 08/02/16 1312 08/02/16 1834  Weight: 62.1 kg (137 lb) 62.1 kg (137 lb) 63.2 kg (139 lb 6.4 oz)    Exam:  General exam: awake, alert, NAD.   Respiratory system:  No increased work of breathing. Cardiovascular system: S1 & S2 heard. Gastrointestinal system: Abdomen is nondistended, soft and nontender.  Normal bowel sounds heard. Central nervous system: Alert and oriented. No focal neurological deficits. Extremities: no cyanosis.    Data Reviewed: Basic Metabolic Panel:  Recent Labs Lab 08/02/16 1520 08/03/16 0509  NA 138 138  K 4.4 4.2  CL 106 108  CO2 26 24  GLUCOSE 133* 98  BUN 54* 47*  CREATININE 1.20* 1.08*  CALCIUM 9.4 8.5*   Liver Function Tests:  Recent Labs Lab 08/02/16 1520  AST 24  ALT 16  ALKPHOS 33*  BILITOT 0.3  PROT 6.8  ALBUMIN 3.6    Recent Labs Lab 08/02/16 1520  LIPASE 10*   No results for input(s): AMMONIA in the last 168 hours. CBC:  Recent Labs Lab 08/02/16 1520 08/03/16 0509 08/04/16 1612 08/05/16 0644  WBC 11.1* 9.5 7.4 8.6  NEUTROABS 7.4  --   --  5.5  HGB 5.9* 7.9* 6.3* 9.3*  HCT 17.5* 23.5* 19.7* 27.6*  MCV 89.7 86.7 89.5 87.9  PLT 246 187 159 151   Cardiac Enzymes:  Recent  Labs Lab 08/02/16 1520  TROPONINI <0.03   CBG (last 3)  No results for input(s): GLUCAP in the last 72 hours. Recent Results (from the past 240 hour(s))  Urine culture     Status: Abnormal   Collection Time: 08/02/16  3:11 PM  Result Value Ref Range Status   Specimen Description URINE, CLEAN CATCH  Final   Special Requests NONE  Final   Culture MULTIPLE SPECIES PRESENT, SUGGEST RECOLLECTION (A)  Final   Report Status 08/04/2016 FINAL  Final     Studies: No results found. Scheduled Meds: . [MAR Hold] sodium chloride   Intravenous Once  . [MAR Hold] ezetimibe  10 mg Oral Daily  . [MAR Hold] feeding supplement  1 Container Oral BID BM  . [MAR Hold] feeding supplement (GLUCERNA SHAKE)  237 mL Oral BID BM  . [MAR Hold] gabapentin  100 mg Oral QHS  . [MAR Hold] multivitamin with minerals  1 tablet Oral Daily  . [MAR Hold] pantoprazole  40 mg Oral QAC supper  . [MAR Hold] pravastatin  20 mg Oral q1800  . [MAR Hold] tiotropium  1 capsule Inhalation Daily   Continuous Infusions: . sodium chloride 100 mL/hr at 08/05/16 0201   Principal Problem:   Symptomatic anemia Active Problems:   Diabetes mellitus (HCC)   Essential hypertension   Gastroesophageal reflux disease   Chronic kidney disease (CKD), stage III (moderate)   Tobacco use disorder   Weight loss, unintentional   Protein-calorie malnutrition, severe  Time spent:   Irwin Brakeman, MD, FAAFP [facility] Pager [phone]  If 7PM-7AM, please contact night-coverage [email or web address] Password [email or web address] 08/05/2016, 11:05 AM    LOS: 3 days

## 2016-08-05 NOTE — Discharge Summary (Signed)
Physician Discharge Summary  Charlotte Henry:[id number] DOB: 1940/08/25 DOA: 08/02/2016  PCP: [name], MD  Admit date: 08/02/2016 Discharge date: 08/05/2016  Admitted From: Home  Disposition:  Home   Recommendations for Outpatient Follow-up:  1. Follow up with PCP in 1 weeks\ 2. Follow up with GI on 3/22 as scheduled for biopsy results, etc. 3. Please obtain BMP/CBC in one week  Discharge Condition: STaBLE CODE STATUS: FULL  Diet recommendation: soft    Brief/Interim Summary: Chief Complaint: weakness, anorexia  HPI: [name] is a 76 y.o. female with medical history significant of pacemaker placement, PVD, ongoing tobacco dependence, h/o TIA, HTN, HLD, h/o peptic ulcer, GERD, DM, CAD, COPD, and CKD presenting with weakness, anorexia.  Patient has been unable to walk since January 9.  She had been on Flagyl and Biaxin.  She began vomiting and was unable to eat, nauseated all the time.  Daughter called Dr. Oneida Alar and she was told to stop it but could no longer walk.  Fainted Jan 21 and has been sick ever since.  Since then, she has lost from 152 to 137 (weight was actually 148 on 1/10 PCP visit and is 139 today).  No appetite.  No dysphagia.  Just doesn't like food.  No n/v.  Ensure is constipating her, took an enema on Monday with improvement.  But Ensure is the only thing that helps her to have the energy to walk.  +dizzy, intermittent fainting.  +wheezing/SOB - chronically.  Has not used inhalers recently.  Numbness in both arms at night when laying on them.  Has had some rashes for hands and neck.  Has refused EGD/colo in the recent past but did 3 recent negative home stool cards.  Assessment & Plan:   Anemia - secondary to GI bleed, confirmed with multiple Black stools noted from bowel prep for colonoscopy. Pt had colonoscopy this morning and no source of bleeding found.  I spoke with GI Dr. Oneida Alar, she says that patient can be discharged home.  Pt to Hold Xarelto for now  and follow up with GI in 3-4 weeks.  Pt has appt on 3/22. Holding Xarelto for now.  S/p 4 units PRBCs.   EGD done yesterday and no source of bleeding was found.    Hypertension - stable, some soft BPs noted and holding her home meds  CKD - stable, following.   Tobacco Dependence - cessation counseled at bedside.  Pt verbalized understanding.   Severe protein cal malnutrition - consult to dietitian with supplements added to diet between meals.    Diabetes Mellitus, type 2 Follow BS Stable at this time Diet controlled  DVT prophylaxis:  SCDS Code Status: full  Family Communication: family at bedside Disposition Plan:  HOME   Consultants: GI (fields)  Procedures:  EGD and colonoscopy  Discharge Diagnoses:  Principal Problem:   Symptomatic anemia Active Problems:   Diabetes mellitus ([place])   Essential hypertension   Gastroesophageal reflux disease   Chronic kidney disease (CKD), stage III (moderate)   Tobacco use disorder   Weight loss, unintentional   Protein-calorie malnutrition, severe  Discharge Instructions  Discharge Instructions    Increase activity slowly    Complete by:  As directed      Allergies as of 08/05/2016      Reactions   Ace Inhibitors Hives, Shortness Of Breath, Swelling   Aspirin Other (See Comments)   Blood in stool and nose bleed   Codeine Swelling   Pineapple Shortness Of  Breath, Swelling   Plasticized Base [plastibase] Hives, Swelling   No plastics   Ultram [tramadol] Swelling   Adhesive [tape] Swelling, Rash   Naprosyn [naproxen] Swelling   Biaxin [clarithromycin] Nausea And Vomiting, Other (See Comments)   Dizziness.   Clindamycin/lincomycin Other (See Comments)   Burning sensation throat, abdomen   Linzess [linaclotide]    DIARRHEA, FECAL INCONTINENCE   Metronidazole    NAUSEA AND WEAKNESS   Nsaids    Penicillins    Lip swelling, Hives Has patient had a PCN reaction causing immediate rash, facial/tongue/throat swelling,  SOB or lightheadedness with hypotension:YES Has patient had a PCN reaction causing severe rash involving mucus membranes or skin necrosis: NO Has patient had a PCN reaction that required hospitalization NO Has patient had a PCN reaction occurring within the last 10 years: NO If all of the above answers are "NO", then may proceed with Cephalosporin use.   Varenicline Tartrate Swelling      Medication List    STOP taking these medications   rivaroxaban 20 MG Tabs tablet Commonly known as:  XARELTO     TAKE these medications   acetaminophen 500 MG tablet Commonly known as:  TYLENOL Take 500 mg by mouth every 6 (six) hours as needed for mild pain or moderate pain.   albuterol 108 (90 Base) MCG/ACT inhaler Commonly known as:  PROVENTIL HFA;VENTOLIN HFA Inhale 2 puffs into the lungs every 4 (four) hours as needed for wheezing or shortness of breath.   ALPRAZolam 0.25 MG tablet Commonly known as:  XANAX Take 1 tablet (0.25 mg total) by mouth at bedtime as needed.   calcium citrate-vitamin D 315-200 MG-UNIT tablet Commonly known as:  CITRACAL+D Take 1 tablet by mouth daily.   Cranberry 250 MG Caps Take 250 mg by mouth daily.   esomeprazole 40 MG capsule Commonly known as:  NEXIUM Take 1 capsule (40 mg total) by mouth daily before breakfast. What changed:  when to take this   feeding supplement (GLUCERNA SHAKE) Liqd Take 237 mLs by mouth 2 (two) times daily between meals.   feeding supplement Liqd Take 1 Container by mouth 2 (two) times daily between meals.   ferrous sulfate 325 (65 FE) MG tablet Take 325 mg by mouth daily with breakfast.   Fluticasone-Salmeterol 100-50 MCG/DOSE Aepb Commonly known as:  ADVAIR Inhale 1 puff into the lungs 2 (two) times daily. What changed:  when to take this  reasons to take this   gabapentin 100 MG capsule Commonly known as:  NEURONTIN Take 1-2 capsules at bedtime for nerve pain What changed:  how much to take  how to take  this  when to take this  additional instructions   lubiprostone 8 MCG capsule Commonly known as:  AMITIZA Take 8 mcg by mouth daily with breakfast.   mirabegron ER 25 MG Tb24 tablet Commonly known as:  MYRBETRIQ Take 1 tablet (25 mg total) by mouth at bedtime. For bladder What changed:  when to take this  reasons to take this  additional instructions   multivitamin with minerals Tabs tablet Take 1 tablet by mouth daily.   PROBIOTIC DAILY PO Take 1 capsule by mouth daily.   promethazine 12.5 MG tablet Commonly known as:  PHENERGAN 1-2 po 30 MINS PRIOR TO MEDS OR MEALS. MAY REPEAT EVERY 4-6 h prn nausea or vomiting   Tiotropium Bromide Monohydrate 1.25 MCG/ACT Aers Commonly known as:  SPIRIVA RESPIMAT Inhale 2 puffs into the lungs daily.   valsartan 160 MG  tablet Commonly known as:  DIOVAN TAKE 1 TABLET BY MOUTH DAILY.   VITAMIN B 12 PO Take 1 tablet by mouth daily.   ZETIA 10 MG tablet Generic drug:  ezetimibe TAKE 1 TABLET BY MOUTH ONCE A DAY.            Durable Medical Equipment        Start     Ordered   08/04/16 1440  For home use only DME Walker rolling  Once    Question:  Patient needs a walker to treat with the following condition  Answer:  COPD (chronic obstructive pulmonary disease) ([place])   08/04/16 1439     Follow-up Information    [facility]   Contact information: [address] [place] [address] [phone]        [name], MD. Schedule an appointment as soon as possible for a visit in 1 week(s).   Specialty:  Family Medicine Why:  Hospital Follow UP  Contact information: [address] [place] [address] [phone]        Barney Drain, MD. Go on 08/24/2016.   Specialty:  Gastroenterology Why:  Hospital Follow Up  Contact information: [place] [address] [phone]          Allergies  Allergen Reactions  . Ace Inhibitors Hives,  Shortness Of Breath and Swelling  . Aspirin Other (See Comments)    Blood in stool and nose bleed  . Codeine Swelling  . Pineapple Shortness Of Breath and Swelling  . Plasticized Base [Plastibase] Hives and Swelling    No plastics  . Ultram [Tramadol] Swelling  . Adhesive [Tape] Swelling and Rash  . Naprosyn [Naproxen] Swelling  . Biaxin [Clarithromycin] Nausea And Vomiting and Other (See Comments)    Dizziness.  . Clindamycin/Lincomycin Other (See Comments)    Burning sensation throat, abdomen  . Linzess [Linaclotide]     DIARRHEA, FECAL INCONTINENCE  . Metronidazole     NAUSEA AND WEAKNESS  . Nsaids   . Penicillins     Lip swelling, Hives Has patient had a PCN reaction causing immediate rash, facial/tongue/throat swelling, SOB or lightheadedness with hypotension:YES Has patient had a PCN reaction causing severe rash involving mucus membranes or skin necrosis: NO Has patient had a PCN reaction that required hospitalization NO Has patient had a PCN reaction occurring within the last 10 years: NO If all of the above answers are "NO", then may proceed with Cephalosporin use.  . Varenicline Tartrate Swelling   Procedures/Studies: Dg Chest 2 View  Result Date: 08/02/2016 CLINICAL DATA:  Weakness, dehydration, and poor p.o. intake. EXAM: CHEST  2 VIEW COMPARISON:  06/28/2016 FINDINGS: Pacemaker remains in place with leads terminating over the right atrium and right ventricle. Cardiac silhouette is normal in size. Tortuosity and calcification are again noted involving the thoracic aorta. The patient's hand partially obscures the left lung base on the AP radiograph. No airspace consolidation, edema, pleural effusion, or pneumothorax is identified. Mild thoracic spondylosis is noted. IMPRESSION: 1. No active cardiopulmonary disease. 2. Aortic atherosclerosis. Electronically Signed   By: Logan Bores M.D.   On: 08/02/2016 16:24      Subjective: Pt tolerated colonoscopy procedure today.    Discharge Exam: Vitals:   08/05/16 1315 08/05/16 1331  BP: 116/60 118/62  Pulse: 60   Resp: 17   Temp:     Vitals:   08/05/16 1305 08/05/16 1310 08/05/16 1315 08/05/16 1331  BP: Marland Kitchen)  112/54 (!) 114/58 116/60 118/62  Pulse:   60   Resp: '15 12 17   '$ Temp:      TempSrc:      SpO2:   97% 100%  Weight:      Height:       General exam: awake, alert, NAD.   Respiratory system:  No increased work of breathing. Cardiovascular system: S1 & S2 heard. Gastrointestinal system: Abdomen is nondistended, soft and nontender. Normal bowel sounds heard. Central nervous system: Alert and oriented. No focal neurological deficits. Extremities: no cyanosis.    The results of significant diagnostics from this hospitalization (including imaging, microbiology, ancillary and laboratory) are listed below for reference.     Microbiology: Recent Results (from the past 240 hour(s))  Urine culture     Status: Abnormal   Collection Time: 08/02/16  3:11 PM  Result Value Ref Range Status   Specimen Description URINE, CLEAN CATCH  Final   Special Requests NONE  Final   Culture MULTIPLE SPECIES PRESENT, SUGGEST RECOLLECTION (A)  Final   Report Status 08/04/2016 FINAL  Final     Labs: BNP (last 3 results) No results for input(s): BNP in the last 8760 hours. Basic Metabolic Panel:  Recent Labs Lab 08/02/16 1520 08/03/16 0509  NA 138 138  K 4.4 4.2  CL 106 108  CO2 26 24  GLUCOSE 133* 98  BUN 54* 47*  CREATININE 1.20* 1.08*  CALCIUM 9.4 8.5*   Liver Function Tests:  Recent Labs Lab 08/02/16 1520  AST 24  ALT 16  ALKPHOS 33*  BILITOT 0.3  PROT 6.8  ALBUMIN 3.6    Recent Labs Lab 08/02/16 1520  LIPASE 10*   No results for input(s): AMMONIA in the last 168 hours. CBC:  Recent Labs Lab 08/02/16 1520 08/03/16 0509 08/04/16 1612 08/05/16 0644  WBC 11.1* 9.5 7.4 8.6  NEUTROABS 7.4  --   --  5.5  HGB 5.9* 7.9* 6.3* 9.3*  HCT 17.5* 23.5* 19.7* 27.6*  MCV 89.7 86.7 89.5  87.9  PLT 246 187 159 151   Cardiac Enzymes:  Recent Labs Lab 08/02/16 1520  TROPONINI <0.03   BNP: Invalid input(s): POCBNP CBG: No results for input(s): GLUCAP in the last 168 hours. D-Dimer No results for input(s): DDIMER in the last 72 hours. Hgb A1c No results for input(s): HGBA1C in the last 72 hours. Lipid Profile No results for input(s): CHOL, HDL, LDLCALC, TRIG, CHOLHDL, LDLDIRECT in the last 72 hours. Thyroid function studies  Recent Labs  08/02/16 1705  TSH 1.718   Anemia work up  Recent Labs  08/03/16 0509  VITAMINB12 1,167*  FOLATE 13.1  FERRITIN 150  TIBC 230*  IRON 52  RETICCTPCT 5.6*   Urinalysis    Component Value Date/Time   COLORURINE YELLOW 08/02/2016 1510   APPEARANCEUR HAZY (A) 08/02/2016 1510   LABSPEC 1.018 08/02/2016 1510   PHURINE 5.0 08/02/2016 1510   GLUCOSEU NEGATIVE 08/02/2016 1510   HGBUR NEGATIVE 08/02/2016 1510   BILIRUBINUR NEGATIVE 08/02/2016 1510   KETONESUR NEGATIVE 08/02/2016 1510   PROTEINUR NEGATIVE 08/02/2016 1510   UROBILINOGEN 2 (H) 12/21/2014 1455   NITRITE NEGATIVE 08/02/2016 1510   LEUKOCYTESUR NEGATIVE 08/02/2016 1510   Sepsis Labs Invalid input(s): PROCALCITONIN,  WBC,  LACTICIDVEN Microbiology Recent Results (from the past 240 hour(s))  Urine culture     Status: Abnormal   Collection Time: 08/02/16  3:11 PM  Result Value Ref Range Status   Specimen Description URINE, CLEAN CATCH  Final  Special Requests NONE  Final   Culture MULTIPLE SPECIES PRESENT, SUGGEST RECOLLECTION (A)  Final   Report Status 08/04/2016 FINAL  Final   Time coordinating discharge: 33 minutes  SIGNED:  Irwin Brakeman, MD  [facility] 08/05/2016, 2:27 PM Pager   If 7PM-7AM, please contact night-coverage [email or web address] Password [email or web address]

## 2016-08-05 NOTE — Progress Notes (Signed)
At 1119, Dr. Oneida Alar ordered blood infusion rate be changed from 124m/hr to  5059mhr to complete blood infusion that was running, so that IV could be used for sedation. (12578meft to infuse at 1119.) Blood infusion completed at 1142. Vital signs stable. No allergic reaction noted.

## 2016-08-05 NOTE — Discharge Instructions (Signed)
Stop Taking Xarelto for now.  Don't start it until you have been told by your doctors that it is safe to restart it again.   Follow up with gastroenterology in office in 4 weeks.  Follow up with PCP in 1 week to have a CBC rechecked.

## 2016-08-05 NOTE — Progress Notes (Signed)
Discharge instructions and medication regime reviewed w/pt and her son.  Pt has her walker from [place].  She has no questions at this time.  Her son is aware of follow-up instructions and appointments.  Vital signs are stable and pt is discharged home.

## 2016-08-05 NOTE — Progress Notes (Signed)
Per Dr. Wynetta Emery, okay to transfuse 1 unit of PRBCs.  Will hold second unit.

## 2016-08-05 NOTE — Interval H&P Note (Signed)
History and Physical Interval Note:  08/05/2016 11:17 AM  Charlotte Henry  has presented today for surgery, with the diagnosis of ANEMIA  The various methods of treatment have been discussed with the patient and family. After consideration of risks, benefits and other options for treatment, the patient has consented to  Procedure(s): COLONOSCOPY (N/A) as a surgical intervention .  The patient's history has been reviewed, patient examined, no change in status, stable for surgery.  I have reviewed the patient's chart and labs.  Questions were answered to the patient's satisfaction.     [employer]

## 2016-08-05 NOTE — Op Note (Signed)
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 DOCUMENTED DROP IN Hb 10.8 TO 5.9 Providers:                Barney Drain, MD, Lurline Del, RN, Aram Candela Referring MD:             Modena Nunnery. [place] Medicines:                Meperidine 25 mg IV, Midazolam 3 mg IV Complications:            No immediate complications. Estimated Blood Loss:     Estimated blood loss was minimal. Procedure:                Pre-Anesthesia Assessment:                           - Prior to the procedure, a History and Physical                            was performed, and patient medications and                            allergies were reviewed. The patient's tolerance of                            previous anesthesia was also reviewed. The risks                            and benefits of the procedure and the sedation                            options and risks were discussed with the patient.                            All questions were answered, and informed consent                            was obtained. Prior Anticoagulants: The patient has                            taken Xarelto (rivaroxaban), last dose was 5 days                            prior to procedure. ASA Grade Assessment: III - A                            patient with severe systemic disease. After  reviewing the risks and benefits, the patient was                            deemed in satisfactory condition to undergo the                            procedure. After obtaining informed consent, the                            colonoscope was passed under direct vision.          Throughout the procedure, the patient's blood                            pressure, pulse, and oxygen saturations were                            monitored continuously. The EC-[id number] ([id number])                            scope was introduced through the anus and advanced                            to the 5 cm into the ileum. The colonoscopy was                            technically difficult and complex due to poor                            endoscopic visualization and significant looping.                            Successful completion of the procedure was aided by                            changing the patient to a prone position,                            straightening and shortening the scope to obtain                            bowel loop reduction, applying abdominal pressure,                            lavage and COLOWRAP. The quality of the bowel                            preparation was fair. The terminal ileum, ileocecal                            valve, appendiceal orifice, and rectum were                            photographed. The patient tolerated the procedure  fairly well. Scope In: 12:04:15 PM Scope Out: 94:85:46 PM Scope Withdrawal Time: 0 hours 31 minutes 5 seconds  Total Procedure Duration: 0 hours 43 minutes 49 seconds  Findings:      The recto-sigmoid colon and sigmoid colon were moderately redundant.       This was biopsied with a cold jumbo forceps for evaluation of       microscopic colitis.      A single large-mouthed diverticulum was found in the hepatic flexure.      External hemorrhoids were found during retroflexion. The hemorrhoids       were moderate.      The terminal ileum appeared normal.      -SIGNIFICANT IRON STAINING. NO FRESH BLOOD OR OLD BLOOD SEEN IN COLON. Impression:               - Preparation of the colon was fair, BUT INADEQUATE                            TO IDENTIFY POLYPS < 10 MM.                            - Redundant LEFT colon.                           - SINGLE Diverticulosis at the hepatic flexure.                           - External hemorrhoids. Moderate Sedation:      Moderate (conscious) sedation was administered by the endoscopy nurse       and supervised by the endoscopist. The following parameters were       monitored: oxygen saturation, heart rate, blood pressure, and response       to care. Total physician intraservice time was 56 minutes. Recommendation:           - Return patient to hospital ward.                           - Cardiac diet. HOLD XARELTO AND IRON.                           - Continue present medications.                           - Await pathology results. NEEDS GIVENS CAPSULE MAR                            12.                           - Return to my office in 4 weeks.                           - No repeat colonoscopy due to age. Procedure Code(s):        --- Professional ---                           [phone], Colonoscopy, flexible; with biopsy, single  or multiple                           99152, Moderate sedation services provided by the                            same physician or other qualified health care                            professional performing the diagnostic or                            therapeutic service that the sedation supports,                            requiring the presence of an independent trained                            observer to assist in the monitoring of the                            patient's level of consciousness and physiological                            status; initial 15 minutes of intraservice time,                            patient age 87 years or older                           [id number], Moderate sedation services; each additional                            15 minutes intraservice time                           99153, Moderate sedation services; each additional                             15 minutes intraservice time                           99153, Moderate sedation services; each additional                            15 minutes intraservice time Diagnosis Code(s):        --- Professional ---                           K64.4, Residual hemorrhoidal skin tags                           D50.0, Iron deficiency anemia secondary to blood                            loss (chronic)  K57.30, Diverticulosis of large intestine without                            perforation or abscess without bleeding                           Q43.8, Other specified congenital malformations of                            intestine CPT copyright 2016 [employer]. All rights reserved. The codes documented in this report are preliminary and upon coder review may  be revised to meet current compliance requirements. Barney Drain, MD Barney Drain, MD 08/05/2016 1:05:58 PM This report has been signed electronically. Number of Addenda: 0

## 2016-08-06 LAB — BPAM RBC
BLOOD PRODUCT EXPIRATION DATE: 201803312359
BLOOD PRODUCT EXPIRATION DATE: 201803312359
Blood Product Expiration Date: 201804042359
Blood Product Expiration Date: 201804042359
ISSUE DATE / TIME: 201802281845
ISSUE DATE / TIME: 201802282217
ISSUE DATE / TIME: 201803030909
UNIT TYPE AND RH: 5100
Unit Type and Rh: 5100
Unit Type and Rh: 5100
Unit Type and Rh: 5100

## 2016-08-06 LAB — TYPE AND SCREEN
ABO/RH(D): O POS
Antibody Screen: NEGATIVE
UNIT DIVISION: 0
Unit division: 0
Unit division: 0
Unit division: 0

## 2016-08-07 DIAGNOSIS — F17210 Nicotine dependence, cigarettes, uncomplicated: Secondary | ICD-10-CM | POA: Diagnosis not present

## 2016-08-07 DIAGNOSIS — E785 Hyperlipidemia, unspecified: Secondary | ICD-10-CM | POA: Diagnosis not present

## 2016-08-07 DIAGNOSIS — J449 Chronic obstructive pulmonary disease, unspecified: Secondary | ICD-10-CM | POA: Diagnosis not present

## 2016-08-07 DIAGNOSIS — K922 Gastrointestinal hemorrhage, unspecified: Secondary | ICD-10-CM | POA: Diagnosis not present

## 2016-08-07 DIAGNOSIS — E1122 Type 2 diabetes mellitus with diabetic chronic kidney disease: Secondary | ICD-10-CM | POA: Diagnosis not present

## 2016-08-07 DIAGNOSIS — N183 Chronic kidney disease, stage 3 (moderate): Secondary | ICD-10-CM | POA: Diagnosis not present

## 2016-08-07 DIAGNOSIS — K219 Gastro-esophageal reflux disease without esophagitis: Secondary | ICD-10-CM | POA: Diagnosis not present

## 2016-08-07 DIAGNOSIS — D631 Anemia in chronic kidney disease: Secondary | ICD-10-CM | POA: Diagnosis not present

## 2016-08-07 DIAGNOSIS — I129 Hypertensive chronic kidney disease with stage 1 through stage 4 chronic kidney disease, or unspecified chronic kidney disease: Secondary | ICD-10-CM | POA: Diagnosis not present

## 2016-08-08 ENCOUNTER — Encounter (HOSPITAL_COMMUNITY): Payer: Self-pay | Admitting: Gastroenterology

## 2016-08-09 ENCOUNTER — Ambulatory Visit (INDEPENDENT_AMBULATORY_CARE_PROVIDER_SITE_OTHER): Payer: Medicare HMO | Admitting: Family Medicine

## 2016-08-09 ENCOUNTER — Encounter: Payer: Self-pay | Admitting: Family Medicine

## 2016-08-09 VITALS — BP 110/68 | HR 82 | Temp 98.6°F | Resp 18 | Ht 67.0 in | Wt 142.0 lb

## 2016-08-09 DIAGNOSIS — E43 Unspecified severe protein-calorie malnutrition: Secondary | ICD-10-CM | POA: Diagnosis not present

## 2016-08-09 DIAGNOSIS — I10 Essential (primary) hypertension: Secondary | ICD-10-CM | POA: Diagnosis not present

## 2016-08-09 DIAGNOSIS — D62 Acute posthemorrhagic anemia: Secondary | ICD-10-CM | POA: Diagnosis not present

## 2016-08-09 LAB — CBC
HEMATOCRIT: 26.3 % — AB (ref 35.0–45.0)
HEMOGLOBIN: 8.1 g/dL — AB (ref 12.0–15.0)
MCH: 29.1 pg (ref 27.0–33.0)
MCHC: 30.8 g/dL — AB (ref 32.0–36.0)
MCV: 94.6 fL (ref 80.0–100.0)
Platelets: 163 10*3/uL (ref 140–400)
RBC: 2.78 MIL/uL — ABNORMAL LOW (ref 3.80–5.10)
RDW: 16.7 % — ABNORMAL HIGH (ref 11.0–15.0)
WBC: 8.1 10*3/uL (ref 3.8–10.8)

## 2016-08-09 NOTE — Progress Notes (Signed)
   Subjective:    Patient ID: Charlotte Henry, female    DOB: [birth year], 76 y.o.   MRN: [id number]  Patient presents for Hospital F/U (is fasting)  Here for hospital follow-up. After my last visit I sent her from the office to the emergency room secondary to shortness of breath and weakness weight loss. She was found to have a hemoglobin of 5.9 and she was transfused 4  units of blood. At discharge on 3/3 her Hb was 9.3.  She had EGD and colonoscopy done which showed internal hemorrhoids mild diverticulosis but no evidence of where the bleeding was coming from. Her Xarelto was held and she was also told to hold her iron that this was not a discharge summary but on the colonoscopy report. She is also still holding her Diovan as her blood pressures have been very soft throughout her malnutrition and the anemia. Her appetite is much improved per weight is up 5 pounds since she left the hospital. She states her appetite is much improved and she is drinking Glucerna She denies any pain, now ambulating without cane or walker   Review Of Systems:  GEN- + fatigue, fever, weight loss,weakness, recent illness  HEENT- denies eye drainage, change in vision, nasal discharge, CVS- denies chest pain, palpitations RESP- denies SOB, cough, wheeze ABD- denies N/V, change in stools, abd pain GU- denies dysuria, hematuria, dribbling, incontinence MSK- denies joint pain, muscle aches, injury Neuro- denies headache, dizziness, syncope, seizure activity       Objective:    BP 110/68   Pulse 82   Temp 98.6 F (37 C) (Oral)   Resp 18   Ht '5\' 7"'$  (1.702 m)   Wt 142 lb (64.4 kg)   SpO2 98%   BMI 22.24 kg/m  GEN- NAD, alert and oriented x3,walking without assistance HEENT- PERRL, EOMI, non injected sclera, pink conjunctiva, MMM, oropharynx clear Neck- Supple, no thyromegaly CVS- RRR, systolic  murmur RESP-CTAB ABD-NABS,soft,NT,ND EXT- No edema Pulses- Radial  2+   CBC WBC 8.1 Hb 8.1 Plt 163       Assessment & Plan:      Problem List Items Addressed This Visit    Protein-calorie malnutrition, severe    Weight has improved since discharge, appetite has improved      Essential hypertension    Blood pressure is still on the low normotensive side. I will keep her off of the Diovan with check a metabolic panel. For her anemia still unclear cause where she lost a significant blood from others she was on a blood thinner as well. Not sure she will have capsule endoscopy done next Will defer to gastroenterology. Her hemoglobin was checked today in the office down 8.1. Since she is assymptomatic I have called GI office and rescheduled her appt to next Tuesday. Still need to find source of bleeding   She is much improved her weight is up her appetite is improvement in her fatigue and weakness continued to improve.       Other Visit Diagnoses    Acute blood loss anemia    -  Primary   Relevant Orders   CBC   Basic metabolic panel      Note: This dictation was prepared with Dragon dictation along with smaller phrase technology. Any transcriptional errors that result from this process are unintentional.

## 2016-08-09 NOTE — Patient Instructions (Addendum)
F/U 3 months GI appt will be March 13th at 8AM with Dr. Oneida Alar office  DO NOT TAKE XARELTO/IRON PILLS/DIOVAN

## 2016-08-09 NOTE — Assessment & Plan Note (Signed)
Blood pressure is still on the low normotensive side. I will keep her off of the Diovan with check a metabolic panel. For her anemia still unclear cause where she lost a significant blood from others she was on a blood thinner as well. Not sure she will have capsule endoscopy done next Will defer to gastroenterology. Her hemoglobin was checked today in the office down 8.1. Since she is assymptomatic I have called GI office and rescheduled her appt to next Tuesday. Still need to find source of bleeding   She is much improved her weight is up her appetite is improvement in her fatigue and weakness continued to improve.

## 2016-08-09 NOTE — Assessment & Plan Note (Signed)
Weight has improved since discharge, appetite has improved

## 2016-08-10 DIAGNOSIS — N183 Chronic kidney disease, stage 3 (moderate): Secondary | ICD-10-CM | POA: Diagnosis not present

## 2016-08-10 DIAGNOSIS — E785 Hyperlipidemia, unspecified: Secondary | ICD-10-CM | POA: Diagnosis not present

## 2016-08-10 DIAGNOSIS — F17210 Nicotine dependence, cigarettes, uncomplicated: Secondary | ICD-10-CM | POA: Diagnosis not present

## 2016-08-10 DIAGNOSIS — J449 Chronic obstructive pulmonary disease, unspecified: Secondary | ICD-10-CM | POA: Diagnosis not present

## 2016-08-10 DIAGNOSIS — E1122 Type 2 diabetes mellitus with diabetic chronic kidney disease: Secondary | ICD-10-CM | POA: Diagnosis not present

## 2016-08-10 DIAGNOSIS — I129 Hypertensive chronic kidney disease with stage 1 through stage 4 chronic kidney disease, or unspecified chronic kidney disease: Secondary | ICD-10-CM | POA: Diagnosis not present

## 2016-08-10 DIAGNOSIS — K219 Gastro-esophageal reflux disease without esophagitis: Secondary | ICD-10-CM | POA: Diagnosis not present

## 2016-08-10 DIAGNOSIS — K922 Gastrointestinal hemorrhage, unspecified: Secondary | ICD-10-CM | POA: Diagnosis not present

## 2016-08-10 DIAGNOSIS — D631 Anemia in chronic kidney disease: Secondary | ICD-10-CM | POA: Diagnosis not present

## 2016-08-10 LAB — BASIC METABOLIC PANEL
BUN: 30 mg/dL — AB (ref 7–25)
CO2: 28 mmol/L (ref 20–31)
Calcium: 9.2 mg/dL (ref 8.6–10.4)
Chloride: 108 mmol/L (ref 98–110)
Creat: 1.05 mg/dL — ABNORMAL HIGH (ref 0.60–0.93)
GLUCOSE: 100 mg/dL — AB (ref 70–99)
POTASSIUM: 4.8 mmol/L (ref 3.5–5.3)
Sodium: 141 mmol/L (ref 135–146)

## 2016-08-15 ENCOUNTER — Encounter (HOSPITAL_COMMUNITY): Payer: Self-pay | Admitting: *Deleted

## 2016-08-15 ENCOUNTER — Emergency Department (HOSPITAL_COMMUNITY): Payer: Medicare HMO

## 2016-08-15 ENCOUNTER — Encounter: Payer: Self-pay | Admitting: Gastroenterology

## 2016-08-15 ENCOUNTER — Ambulatory Visit: Payer: Medicare HMO | Admitting: Nurse Practitioner

## 2016-08-15 ENCOUNTER — Inpatient Hospital Stay (HOSPITAL_COMMUNITY)
Admission: EM | Admit: 2016-08-15 | Discharge: 2016-08-20 | DRG: 394 | Disposition: A | Discharge status: Home / Self Care | Payer: Medicare HMO | Attending: Family Medicine | Admitting: Family Medicine

## 2016-08-15 DIAGNOSIS — Z888 Allergy status to other drugs, medicaments and biological substances status: Secondary | ICD-10-CM

## 2016-08-15 DIAGNOSIS — I10 Essential (primary) hypertension: Secondary | ICD-10-CM | POA: Diagnosis present

## 2016-08-15 DIAGNOSIS — K6389 Other specified diseases of intestine: Secondary | ICD-10-CM | POA: Diagnosis not present

## 2016-08-15 DIAGNOSIS — E1151 Type 2 diabetes mellitus with diabetic peripheral angiopathy without gangrene: Secondary | ICD-10-CM | POA: Diagnosis present

## 2016-08-15 DIAGNOSIS — M109 Gout, unspecified: Secondary | ICD-10-CM | POA: Diagnosis present

## 2016-08-15 DIAGNOSIS — I773 Arterial fibromuscular dysplasia: Secondary | ICD-10-CM | POA: Diagnosis present

## 2016-08-15 DIAGNOSIS — J449 Chronic obstructive pulmonary disease, unspecified: Secondary | ICD-10-CM | POA: Diagnosis present

## 2016-08-15 DIAGNOSIS — K922 Gastrointestinal hemorrhage, unspecified: Secondary | ICD-10-CM | POA: Diagnosis not present

## 2016-08-15 DIAGNOSIS — E785 Hyperlipidemia, unspecified: Secondary | ICD-10-CM | POA: Diagnosis present

## 2016-08-15 DIAGNOSIS — I129 Hypertensive chronic kidney disease with stage 1 through stage 4 chronic kidney disease, or unspecified chronic kidney disease: Secondary | ICD-10-CM | POA: Diagnosis not present

## 2016-08-15 DIAGNOSIS — E1122 Type 2 diabetes mellitus with diabetic chronic kidney disease: Secondary | ICD-10-CM | POA: Diagnosis present

## 2016-08-15 DIAGNOSIS — Z886 Allergy status to analgesic agent status: Secondary | ICD-10-CM

## 2016-08-15 DIAGNOSIS — D62 Acute posthemorrhagic anemia: Secondary | ICD-10-CM | POA: Diagnosis present

## 2016-08-15 DIAGNOSIS — D649 Anemia, unspecified: Secondary | ICD-10-CM

## 2016-08-15 DIAGNOSIS — I2510 Atherosclerotic heart disease of native coronary artery without angina pectoris: Secondary | ICD-10-CM | POA: Diagnosis present

## 2016-08-15 DIAGNOSIS — K589 Irritable bowel syndrome without diarrhea: Secondary | ICD-10-CM | POA: Diagnosis present

## 2016-08-15 DIAGNOSIS — R531 Weakness: Secondary | ICD-10-CM | POA: Diagnosis not present

## 2016-08-15 DIAGNOSIS — I442 Atrioventricular block, complete: Secondary | ICD-10-CM | POA: Diagnosis present

## 2016-08-15 DIAGNOSIS — Z881 Allergy status to other antibiotic agents status: Secondary | ICD-10-CM | POA: Diagnosis not present

## 2016-08-15 DIAGNOSIS — K219 Gastro-esophageal reflux disease without esophagitis: Secondary | ICD-10-CM | POA: Diagnosis present

## 2016-08-15 DIAGNOSIS — Z79899 Other long term (current) drug therapy: Secondary | ICD-10-CM | POA: Diagnosis not present

## 2016-08-15 DIAGNOSIS — K921 Melena: Secondary | ICD-10-CM | POA: Diagnosis present

## 2016-08-15 DIAGNOSIS — R0602 Shortness of breath: Secondary | ICD-10-CM | POA: Diagnosis not present

## 2016-08-15 DIAGNOSIS — N189 Chronic kidney disease, unspecified: Secondary | ICD-10-CM | POA: Diagnosis present

## 2016-08-15 DIAGNOSIS — D500 Iron deficiency anemia secondary to blood loss (chronic): Secondary | ICD-10-CM

## 2016-08-15 DIAGNOSIS — F17210 Nicotine dependence, cigarettes, uncomplicated: Secondary | ICD-10-CM | POA: Diagnosis present

## 2016-08-15 DIAGNOSIS — Z880 Allergy status to penicillin: Secondary | ICD-10-CM | POA: Diagnosis not present

## 2016-08-15 DIAGNOSIS — T189XXA Foreign body of alimentary tract, part unspecified, initial encounter: Secondary | ICD-10-CM | POA: Diagnosis not present

## 2016-08-15 DIAGNOSIS — Z950 Presence of cardiac pacemaker: Secondary | ICD-10-CM

## 2016-08-15 DIAGNOSIS — B9681 Helicobacter pylori [H. pylori] as the cause of diseases classified elsewhere: Secondary | ICD-10-CM | POA: Diagnosis not present

## 2016-08-15 DIAGNOSIS — N183 Chronic kidney disease, stage 3 (moderate): Secondary | ICD-10-CM | POA: Diagnosis not present

## 2016-08-15 DIAGNOSIS — Z8673 Personal history of transient ischemic attack (TIA), and cerebral infarction without residual deficits: Secondary | ICD-10-CM | POA: Diagnosis not present

## 2016-08-15 DIAGNOSIS — Z955 Presence of coronary angioplasty implant and graft: Secondary | ICD-10-CM | POA: Diagnosis not present

## 2016-08-15 DIAGNOSIS — K6381 Dieulafoy lesion of intestine: Principal | ICD-10-CM | POA: Diagnosis present

## 2016-08-15 DIAGNOSIS — K5909 Other constipation: Secondary | ICD-10-CM | POA: Diagnosis not present

## 2016-08-15 HISTORY — DX: Anemia, unspecified: D64.9

## 2016-08-15 LAB — BRAIN NATRIURETIC PEPTIDE: B NATRIURETIC PEPTIDE 5: 45 pg/mL (ref 0.0–100.0)

## 2016-08-15 LAB — BASIC METABOLIC PANEL
Anion gap: 5 (ref 5–15)
BUN: 41 mg/dL — ABNORMAL HIGH (ref 6–20)
CO2: 26 mmol/L (ref 22–32)
CREATININE: 1.14 mg/dL — AB (ref 0.44–1.00)
Calcium: 8.8 mg/dL — ABNORMAL LOW (ref 8.9–10.3)
Chloride: 109 mmol/L (ref 101–111)
GFR calc non Af Amer: 45 mL/min — ABNORMAL LOW (ref >60)
GFR, EST AFRICAN AMERICAN: 52 mL/min — AB (ref >60)
Glucose, Bld: 176 mg/dL — ABNORMAL HIGH (ref 65–99)
Potassium: 4 mmol/L (ref 3.5–5.1)
Sodium: 140 mmol/L (ref 135–145)

## 2016-08-15 LAB — CBC WITH DIFFERENTIAL/PLATELET
BASOS PCT: 0 %
Basophils Absolute: 0 10*3/uL (ref 0.0–0.1)
Eosinophils Absolute: 0.1 10*3/uL (ref 0.0–0.7)
Eosinophils Relative: 1 %
HEMATOCRIT: 16.1 % — AB (ref 36.0–46.0)
HEMOGLOBIN: 5.4 g/dL — AB (ref 12.0–15.0)
LYMPHS PCT: 25 %
Lymphs Abs: 2.4 10*3/uL (ref 0.7–4.0)
MCH: 32.1 pg (ref 26.0–34.0)
MCHC: 33.5 g/dL (ref 30.0–36.0)
MCV: 95.8 fL (ref 78.0–100.0)
MONOS PCT: 6 %
Monocytes Absolute: 0.5 10*3/uL (ref 0.1–1.0)
NEUTROS ABS: 6.5 10*3/uL (ref 1.7–7.7)
Neutrophils Relative %: 68 %
Platelets: 230 10*3/uL (ref 150–400)
RBC: 1.68 MIL/uL — ABNORMAL LOW (ref 3.87–5.11)
RDW: 17.8 % — ABNORMAL HIGH (ref 11.5–15.5)
WBC: 9.6 10*3/uL (ref 4.0–10.5)

## 2016-08-15 LAB — COMPREHENSIVE METABOLIC PANEL
ALT: 17 U/L (ref 14–54)
AST: 27 U/L (ref 15–41)
Albumin: 3.2 g/dL — ABNORMAL LOW (ref 3.5–5.0)
Alkaline Phosphatase: 31 U/L — ABNORMAL LOW (ref 38–126)
Anion gap: 6 (ref 5–15)
BILIRUBIN TOTAL: 0.8 mg/dL (ref 0.3–1.2)
BUN: 36 mg/dL — AB (ref 6–20)
CO2: 24 mmol/L (ref 22–32)
Calcium: 8.4 mg/dL — ABNORMAL LOW (ref 8.9–10.3)
Chloride: 107 mmol/L (ref 101–111)
Creatinine, Ser: 1.02 mg/dL — ABNORMAL HIGH (ref 0.44–1.00)
GFR calc Af Amer: 60 mL/min — ABNORMAL LOW (ref >60)
GFR, EST NON AFRICAN AMERICAN: 52 mL/min — AB (ref >60)
Glucose, Bld: 98 mg/dL (ref 65–99)
POTASSIUM: 4 mmol/L (ref 3.5–5.1)
Sodium: 137 mmol/L (ref 135–145)
Total Protein: 5.7 g/dL — ABNORMAL LOW (ref 6.5–8.1)

## 2016-08-15 LAB — CBC
HEMATOCRIT: 26.9 % — AB (ref 36.0–46.0)
Hemoglobin: 9 g/dL — ABNORMAL LOW (ref 12.0–15.0)
MCH: 30.3 pg (ref 26.0–34.0)
MCHC: 33.5 g/dL (ref 30.0–36.0)
MCV: 90.6 fL (ref 78.0–100.0)
Platelets: 205 10*3/uL (ref 150–400)
RBC: 2.97 MIL/uL — ABNORMAL LOW (ref 3.87–5.11)
RDW: 16.4 % — AB (ref 11.5–15.5)
WBC: 9.9 10*3/uL (ref 4.0–10.5)

## 2016-08-15 LAB — PROTIME-INR
INR: 1.02
Prothrombin Time: 13.4 seconds (ref 11.4–15.2)

## 2016-08-15 LAB — TROPONIN I: Troponin I: <0.03 ng/mL (ref <0.03)

## 2016-08-15 LAB — PREPARE RBC (CROSSMATCH)

## 2016-08-15 LAB — POC OCCULT BLOOD, ED: FECAL OCCULT BLD: POSITIVE — AB

## 2016-08-15 LAB — APTT: aPTT: 25 seconds (ref 24–36)

## 2016-08-15 MED ORDER — SODIUM CHLORIDE 0.9 % IV SOLN
INTRAVENOUS | Status: AC
  Administered 2016-08-16 (×2): via INTRAVENOUS

## 2016-08-15 MED ORDER — SODIUM CHLORIDE 0.9 % IV SOLN
Freq: Once | INTRAVENOUS | Status: AC
  Administered 2016-08-15: 04:00:00 via INTRAVENOUS

## 2016-08-15 MED ORDER — ONDANSETRON HCL 4 MG PO TABS: 4.0000 mg | ORAL_TABLET | Freq: Four times a day (QID) | ORAL | Status: DC | PRN

## 2016-08-15 MED ORDER — MIRABEGRON ER 25 MG PO TB24
25.0000 mg | ORAL_TABLET | Freq: Every day | ORAL | Status: DC | PRN
  Filled 2016-08-15: qty 1

## 2016-08-15 MED ORDER — GABAPENTIN 100 MG PO CAPS
100.0000 mg | ORAL_CAPSULE | Freq: Every day | ORAL | Status: DC
  Administered 2016-08-15 – 2016-08-19 (×5): 100 mg via ORAL
  Filled 2016-08-15 (×5): qty 1

## 2016-08-15 MED ORDER — ONDANSETRON HCL 4 MG/2ML IJ SOLN: 4.0000 mg | Freq: Four times a day (QID) | INTRAMUSCULAR | Status: DC | PRN

## 2016-08-15 MED ORDER — ALPRAZOLAM 0.25 MG PO TABS
0.2500 mg | ORAL_TABLET | Freq: Every evening | ORAL | Status: DC | PRN
  Administered 2016-08-17 (×2): 0.25 mg via ORAL
  Filled 2016-08-15 (×4): qty 1

## 2016-08-15 MED ORDER — MOMETASONE FURO-FORMOTEROL FUM 100-5 MCG/ACT IN AERO
2.0000 | INHALATION_SPRAY | Freq: Two times a day (BID) | RESPIRATORY_TRACT | Status: DC
  Administered 2016-08-15 – 2016-08-20 (×9): 2 via RESPIRATORY_TRACT
  Filled 2016-08-15 (×2): qty 8.8

## 2016-08-15 NOTE — Consult Note (Signed)
Referring Provider: Dr. Darrick Meigs  Primary Care Physician:  [name], MD Primary Gastroenterologist:  Dr.Fields   Date of Admission: 08/15/16 Date of Consultation: 08/15/16  Reason for Consultation:  Acute on chronic anemia, GI bleed   HPI:  Charlotte Henry is a 76 y.o. year old female well known to our practice recently inpatient 2/28 through 3/3 with GI bleed while on Xarelto, undergoing colonoscopy and EGD. Colonoscopy with normal biopsies, no source of bleeding, EGD with H.pylori gastritis (further details in [facility]). Capsule study was recommended. She received 4 units PRBCs during recent hospitalization.  She has been off Xarelto since recent admission.  Discharge Hgb on 3/3 was 9.3. As an outpatient last week, her Hgb had drifted to 8.1. This morning, she presented with weakness, fatigue, SOB on exertion, found to have Hgb 5.4, heme positive, no overt GI bleeding. 2 units PRBCs have been orderd, with second unit transfusing.   Feels less fatigued after first unit. Denies abdominal pain, N/V. No melena or hematochezia. Denies NSAIDs or aspirin powders. Not on Xarelto since last discharge.    She has failed H.pylori treatment earlier this year due to inability to tolerate the regimen, with resulting N/V.  Past Medical History:  Diagnosis Date  . 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 ASCVD (arteriosclerotic cardiovascular disease)    a. cath in 4/04,-80%LAD; 70% RCA, -> DESx2; residual 60% distal RCA; nl EF  b. Nuc 02/2016 aborted due to bradycardia. Will need to be rescheduled   . Carotid artery occlusion   . Cerebrovascular disease   . CHB (complete heart block) ([place])    a. s/p PPM on 03/24/16 with a St. Jude (serial number [id number]) pacemaker  . Chronic kidney disease (CKD), stage III (moderate)   . Clostridium difficile colitis 03/2013   a. 03/2013  . COPD (chronic obstructive pulmonary disease) ([place])   . Coronary disease   . Diabetes mellitus   . Diverticulosis   . GERD  (gastroesophageal reflux disease)   . Gout   . H pylori ulcer   . Hyperlipidemia    chose to stop statin, takes Zetia  . Hypertension    not currently on medication  . IBS (irritable bowel syndrome)   . PVD (peripheral vascular disease) ([place])    a. moderate to severely decreased ABI's in 8/08, sig. aortic inflow disease b. repeat ABI's in 2011 improved- mild disease on the left and moderate to severe disease on the right  . S/P placement of cardiac pacemaker    a. 03/24/16: St. Jude (serial number [id number]) pacemaker  . TIA (transient ischemic attack) 2012  . Tobacco abuse    a. 50 pack years continuing at 1/2 pack per day    Past Surgical History:  Procedure Laterality Date  . ABDOMINAL HYSTERECTOMY    . carpel tunnel release     bilateral  . CHOLECYSTECTOMY    . COLONOSCOPY  2009  . COLONOSCOPY N/A 08/04/2016   incomplete due to prep.   . COLONOSCOPY N/A 08/05/2016   Dr. Oneida Alar: moderately redundant rectosigmoid and sigmoid, normal TI, single large-mouthed diverticulum in hepatic flexure, external hemorrhoids, path negative for microscopic colitis   . EP IMPLANTABLE DEVICE N/A 03/24/2016   Procedure: Pacemaker Implant;  Surgeon: [name], MD;  Location: [facility];  Service: Cardiovascular;  Laterality: N/A;  . ESOPHAGOGASTRODUODENOSCOPY N/A 08/04/2016   Dr. Oneida Alar: non-critical Schatzki's ring, small hiatal hernia, +H.pylori gastritis on path.   . TOTAL ABDOMINAL HYSTERECTOMY W/ BILATERAL SALPINGOOPHORECTOMY  Prior to Admission medications   Medication Sig Start Date End Date Taking? Authorizing Provider  acetaminophen (TYLENOL) 500 MG tablet Take 500 mg by mouth every 6 (six) hours as needed for mild pain or moderate pain.    Historical Provider, MD  albuterol (PROVENTIL HFA;VENTOLIN HFA) 108 (90 Base) MCG/ACT inhaler Inhale 2 puffs into the lungs every 4 (four) hours as needed for wheezing or shortness of breath. 07/23/15   Alycia Rossetti, MD  ALPRAZolam  Duanne Moron) 0.25 MG tablet Take 1 tablet (0.25 mg total) by mouth at bedtime as needed. 06/14/16   Alycia Rossetti, MD  calcium citrate-vitamin D (CITRACAL+D) 315-200 MG-UNIT per tablet Take 1 tablet by mouth daily.      Historical Provider, MD  Cranberry 250 MG CAPS Take 250 mg by mouth daily.    Historical Provider, MD  Cyanocobalamin (VITAMIN B 12 PO) Take 1 tablet by mouth daily.     Historical Provider, MD  esomeprazole (NEXIUM) 40 MG capsule Take 1 capsule (40 mg total) by mouth daily before breakfast. Patient taking differently: Take 40 mg by mouth 2 (two) times daily before a meal.  08/02/16   Mahala Menghini, PA-C  feeding supplement, GLUCERNA SHAKE, (GLUCERNA SHAKE) LIQD Take 237 mLs by mouth 2 (two) times daily between meals. 08/05/16 09/04/16  Clanford Marisa Hua, MD  ferrous sulfate 325 (65 FE) MG tablet Take 325 mg by mouth daily with breakfast.    Historical Provider, MD  Fluticasone-Salmeterol (ADVAIR) 100-50 MCG/DOSE AEPB Inhale 1 puff into the lungs 2 (two) times daily. Patient taking differently: Inhale 1 puff into the lungs 2 (two) times daily as needed (Shortness of breath).  12/15/15   Alycia Rossetti, MD  gabapentin (NEURONTIN) 100 MG capsule Take 1-2 capsules at bedtime for nerve pain Patient taking differently: Take 100 mg by mouth at bedtime. Take 1-2 capsules at bedtime for nerve pain 06/14/16   Alycia Rossetti, MD  lubiprostone (AMITIZA) 8 MCG capsule Take 8 mcg by mouth daily with breakfast.    Historical Provider, MD  mirabegron ER (MYRBETRIQ) 25 MG TB24 tablet Take 1 tablet (25 mg total) by mouth at bedtime. For bladder Patient taking differently: Take 25 mg by mouth daily as needed (bladder). For bladder 07/23/15   Alycia Rossetti, MD  Multiple Vitamin (MULTIVITAMIN WITH MINERALS) TABS tablet Take 1 tablet by mouth daily. 08/05/16   Clanford Marisa Hua, MD  Probiotic Product (PROBIOTIC DAILY PO) Take 1 capsule by mouth daily.     Historical Provider, MD  promethazine (PHENERGAN)  12.5 MG tablet 1-2 po 30 MINS PRIOR TO MEDS OR MEALS. MAY REPEAT EVERY 4-6 h prn nausea or vomiting Patient not taking: Reported on 08/09/2016 06/23/16   Danie Binder, MD  Tiotropium Bromide Monohydrate (SPIRIVA RESPIMAT) 1.25 MCG/ACT AERS Inhale 2 puffs into the lungs daily. 06/14/16   Alycia Rossetti, MD  ZETIA 10 MG tablet TAKE 1 TABLET BY MOUTH ONCE A DAY. 02/29/16   Herminio Commons, MD    Current Facility-Administered Medications  Medication Dose Route Frequency Provider Last Rate Last Dose  . 0.9 %  sodium chloride infusion   Intravenous Continuous Oswald Hillock, MD      . ALPRAZolam Duanne Moron) tablet 0.25 mg  0.25 mg Oral QHS PRN Oswald Hillock, MD      . gabapentin (NEURONTIN) capsule 100 mg  100 mg Oral QHS Oswald Hillock, MD      . mirabegron ER [facility]) tablet 25 mg  25 mg Oral Daily PRN Oswald Hillock, MD      . mometasone-formoterol (DULERA) 100-5 MCG/ACT inhaler 2 puff  2 puff Inhalation BID Kathie Dike, MD      . ondansetron (ZOFRAN) tablet 4 mg  4 mg Oral Q6H PRN Oswald Hillock, MD       Or  . ondansetron (ZOFRAN) injection 4 mg  4 mg Intravenous Q6H PRN Oswald Hillock, MD       Current Outpatient Prescriptions  Medication Sig Dispense Refill  . acetaminophen (TYLENOL) 500 MG tablet Take 500 mg by mouth every 6 (six) hours as needed for mild pain or moderate pain.    Marland Kitchen albuterol (PROVENTIL HFA;VENTOLIN HFA) 108 (90 Base) MCG/ACT inhaler Inhale 2 puffs into the lungs every 4 (four) hours as needed for wheezing or shortness of breath. 1 Inhaler 2  . ALPRAZolam (XANAX) 0.25 MG tablet Take 1 tablet (0.25 mg total) by mouth at bedtime as needed. 30 tablet 1  . calcium citrate-vitamin D (CITRACAL+D) 315-200 MG-UNIT per tablet Take 1 tablet by mouth daily.      . Cranberry 250 MG CAPS Take 250 mg by mouth daily.    . Cyanocobalamin (VITAMIN B 12 PO) Take 1 tablet by mouth daily.     Marland Kitchen esomeprazole (NEXIUM) 40 MG capsule Take 1 capsule (40 mg total) by mouth daily before breakfast.  (Patient taking differently: Take 40 mg by mouth 2 (two) times daily before a meal. ) 30 capsule 5  . feeding supplement, GLUCERNA SHAKE, (GLUCERNA SHAKE) LIQD Take 237 mLs by mouth 2 (two) times daily between meals. 14220 mL 0  . ferrous sulfate 325 (65 FE) MG tablet Take 325 mg by mouth daily with breakfast.    . Fluticasone-Salmeterol (ADVAIR) 100-50 MCG/DOSE AEPB Inhale 1 puff into the lungs 2 (two) times daily. (Patient taking differently: Inhale 1 puff into the lungs 2 (two) times daily as needed (Shortness of breath). ) 1 each 3  . gabapentin (NEURONTIN) 100 MG capsule Take 1-2 capsules at bedtime for nerve pain (Patient taking differently: Take 100 mg by mouth at bedtime. Take 1-2 capsules at bedtime for nerve pain) 60 capsule 2  . lubiprostone (AMITIZA) 8 MCG capsule Take 8 mcg by mouth daily with breakfast.    . mirabegron ER (MYRBETRIQ) 25 MG TB24 tablet Take 1 tablet (25 mg total) by mouth at bedtime. For bladder (Patient taking differently: Take 25 mg by mouth daily as needed (bladder). For bladder) 30 tablet 6  . Multiple Vitamin (MULTIVITAMIN WITH MINERALS) TABS tablet Take 1 tablet by mouth daily.    . Probiotic Product (PROBIOTIC DAILY PO) Take 1 capsule by mouth daily.     . promethazine (PHENERGAN) 12.5 MG tablet 1-2 po 30 MINS PRIOR TO MEDS OR MEALS. MAY REPEAT EVERY 4-6 h prn nausea or vomiting (Patient not taking: Reported on 08/09/2016) 40 tablet 1  . Tiotropium Bromide Monohydrate (SPIRIVA RESPIMAT) 1.25 MCG/ACT AERS Inhale 2 puffs into the lungs daily. 1 Inhaler 11  . ZETIA 10 MG tablet TAKE 1 TABLET BY MOUTH ONCE A DAY. 30 tablet 6    Allergies as of 08/15/2016 - Review Complete 08/15/2016  Allergen Reaction Noted  . Ace inhibitors Hives, Shortness Of Breath, and Swelling 10/27/2008  . Aspirin Other (See Comments) 10/27/2008  . Codeine Swelling 02/02/2011  . Pineapple Shortness Of Breath and Swelling 09/03/2013  . Plasticized base [plastibase] Hives and Swelling 02/07/2012   . Ultram [tramadol] Swelling 03/21/2012  . Adhesive [tape]  Swelling and Rash 02/07/2012  . Naprosyn [naproxen] Swelling 02/02/2011  . Biaxin [clarithromycin] Nausea And Vomiting and Other (See Comments) 07/04/2016  . Clindamycin/lincomycin Other (See Comments) 05/04/2014  . Linzess [linaclotide]  02/26/2013  . Metronidazole  06/29/2016  . Nsaids  03/04/2012  . Penicillins  05/04/2014  . Varenicline tartrate Swelling 10/27/2008    Family History  Problem Relation Age of Onset  . Liver disease Mother 59  . Hypertension Mother   . Cerebral aneurysm Father   . Aneurysm Father 36  . Cancer Sister   . Liver disease Brother   . Liver disease Brother   . Liver disease Brother   . Diabetes type II Brother   . Alcohol abuse Brother   . Cancer Sister     Social History   Social History  . Marital status: Widowed    Spouse name: N/A  . Number of children: N/A  . Years of education: N/A   Occupational History  . Not on file.   Social History Main Topics  . Smoking status: Current Every Day Smoker    Packs/day: 0.50    Years: 50.00    Types: Cigarettes    Start date: 02/02/1966  . Smokeless tobacco: Never Used     Comment: 1/2 pack daily  . Alcohol use No  . Drug use: No  . Sexual activity: Not Currently   Other Topics Concern  . Not on file   Social History Narrative  . No narrative on file    Review of Systems: Gen: see HPI  CV: Denies chest pain, heart palpitations, syncope, edema  Resp: see HPI  GI: see HPI  GU : Denies urinary burning, urinary frequency, urinary incontinence.  MS: Denies joint pain,swelling, cramping Derm: Denies rash, itching, dry skin Psych: Denies depression, anxiety,confusion, or memory loss Heme: Denies bruising, bleeding, and enlarged lymph nodes.  Physical Exam: Vital signs in last 24 hours: Temp:  [98.4 F (36.9 C)-98.7 F (37.1 C)] 98.7 F (37.1 C) (03/13 0729) Pulse Rate:  [59-86] 59 (03/13 0830) Resp:  [14-20] 17 (03/13  0830) BP: (89-127)/(49-106) 127/58 (03/13 0830) SpO2:  [96 %-100 %] 99 % (03/13 0830) Weight:  [142 lb (64.4 kg)] 142 lb (64.4 kg) (03/13 0024)   General:   Alert, pleasant and cooperative in NAD Head:  Normocephalic and atraumatic. Eyes:  Sclera clear, no icterus.   Conjunctiva pink. Ears:  Normal auditory acuity. Nose:  No deformity, discharge,  or lesions. Mouth:  No deformity or lesions, dentition normal. Lungs:  Clear throughout to auscultation.   No wheezes, crackles, or rhonchi. No acute distress. Heart:  Regular rate and rhythm; systolic murmur noted Abdomen:  Soft, nontender and nondistended. No masses, hepatosplenomegaly or hernias noted. Normal bowel sounds, without guarding, and without rebound.   Rectal:  Deferred  Msk:  Symmetrical without gross deformities. Normal posture. Extremities:  Without clubbing or edema. Neurologic:  Alert and  oriented x4 Psych:  Alert and cooperative. Normal mood and affect.  Intake/Output from previous day: 03/12 0701 - 03/13 0700 In: 335 [Blood:335] Out: -  Intake/Output this shift: Total I/O In: 335 [Blood:335] Out: -   Lab Results:  Recent Labs  08/15/16 0048  WBC 9.6  HGB 5.4*  HCT 16.1*  PLT 230   BMET  Recent Labs  08/15/16 0048  NA 140  K 4.0  CL 109  CO2 26  GLUCOSE 176*  BUN 41*  CREATININE 1.14*  CALCIUM 8.8*   PT/INR  Recent Labs  08/15/16 0048  LABPROT 13.4  INR 1.02   Studies/Results: Dg Chest Portable 1 View  Result Date: 08/15/2016 CLINICAL DATA:  Weakness for 2 weeks. Shortness of breath. Upper mid back pain. History of diabetes and hypertension. EXAM: PORTABLE CHEST 1 VIEW COMPARISON:  08/02/2016 FINDINGS: Cardiac pacemaker. Shallow inspiration. Normal heart size and pulmonary vascularity. No focal airspace disease or consolidation in the lungs. No blunting of costophrenic angles. No pneumothorax. Mediastinal contours appear intact. Calcified and tortuous aorta. Degenerative changes in the  shoulders. IMPRESSION: No active disease. Electronically Signed   By: Lucienne Capers M.D.   On: 08/15/2016 01:03    Impression: 76 year old female readmitted with acute blood loss anemia, heme positive stool but without overt GI bleeding, previously admitted for same with colonoscopy/EGD unrevealing. No longer on Xarelto since last admission. Henry evaluation of small bowel; however, she has a pacemaker. Prior studies have noted capsule endoscopy performed with pacemakers, but it would be best to have cardiology input prior to pursuing this. Will need to discuss with Dr. Gala Romney the best way to further evaluate occult blood loss, as small bowel source has not been assessed yet. Currently completing 2/2 units PRBCs, with improvement in fatigue and shortness of breath since admission.    H.pylori noted on recent EGD pathology, which will need to be treated at some point in the future after evaluation for acute blood loss anemia.   Plan: Clear liquids Complete transfusion, monitor H/H Henry further evaluation of small bowel, as this would complete the GI evaluation: will need to discuss best modality with attending. May need cardiology input.  Further recommendations to follow Outpatient treatment of H.pylori at later date in an elective setting   Charlotte Henry, ANP-BC [facility]     LOS: 0 days    08/15/2016, 8:57 AM

## 2016-08-15 NOTE — ED Notes (Addendum)
Christina, rn given blood audit sheet and paper from 1st blood administration and blood consent.  Audit also sent for second unit of blood.

## 2016-08-15 NOTE — ED Notes (Signed)
Dr. Roderic Palau at bedside at this time.

## 2016-08-15 NOTE — ED Triage Notes (Signed)
Pt c/o feeling weak that started earlier today that has gotten worse; pt states she has felt weak since she was discharged last week

## 2016-08-15 NOTE — ED Notes (Signed)
CRITICAL VALUE ALERT  Critical value received:  Hgb 5.4  Date of notification:  08/15/2016  Time of notification:  0116  Critical value read back:Yes.    Nurse who received alert:  Fabio Neighbors RN  MD notified (1st page):  Dr. Tomi Bamberger  Time of first page:  0116  MD notified (2nd page):  Time of second page:  Responding MD:    Time MD responded:

## 2016-08-15 NOTE — ED Notes (Signed)
Clear liquid tray given to pt.  Pt also assisted to [facility] to urinate.  Pt placed back in bed on monitor.  Daughter at bedside at this time.

## 2016-08-15 NOTE — H&P (Signed)
TRH H&P    Patient Demographics:    Charlotte Henry, is a 76 y.o. female  MRN: [id number]  DOB - 12-15-1940  Admit Date - 08/15/2016  Referring MD/NP/PA: Dr Tomi Bamberger  Outpatient Primary MD for the patient is [name], MD  Patient coming from: Home  Chief Complaint  Patient presents with  . Weakness      HPI:    Charlotte Henry  is a 76 y.o. female, With history of CAD, PVD, GERD, hypertension, CAD, COPD who was just discharged from hospital on 08/05/2016 after patient was admitted for anemia, with guaiac-positive stool. At that time patient underwent EGD and colonoscopy, which did not show significant source of bleeding. EGD showed gastritis and colonoscopy showed hemorrhoids, large diverticulum at hepatic flexure. Biopsy obtained from colonoscopy showed normal colonic mucosa.  Patient was discharged home on 08/05/2016 and hemoglobin at that time was 9.3. Repeat hemoglobin on 08/09/2016 at PCP office was 8.1.  Today patient came to ED as she has been complaining of shortness of breath on exertion, generalized weakness and fatigue. She denies chest pain. No nausea vomiting or diarrhea. No black colored stool. She denies seeing Frank blood in stool. Lab work in the ED showed hemoglobin of 5.4. FOBT was positive Patient was supposed to follow-up with GI on Tuesday 08/15/2016.   Review of systems:      A full 10 point Review of Systems was done, except as stated above, all other Review of Systems were negative.   With Past History of the following :    Past Medical History:  Diagnosis Date  . 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 ASCVD (arteriosclerotic cardiovascular disease)    a. cath in 4/04,-80%LAD; 70% RCA, -> DESx2; residual 60% distal RCA; nl EF  b. Nuc 02/2016 aborted due to bradycardia. Will need to be rescheduled   . Carotid artery occlusion   . Cerebrovascular disease   . CHB  (complete heart block) ([place])    a. s/p PPM on 03/24/16 with a [employer] Jude (serial number [id number]) pacemaker  . Chronic kidney disease (CKD), stage III (moderate)   . Clostridium difficile colitis 03/2013   a. 03/2013  . COPD (chronic obstructive pulmonary disease) ([place])   . Coronary disease   . Diabetes mellitus   . Diverticulosis   . GERD (gastroesophageal reflux disease)   . Gout   . H pylori ulcer   . Hyperlipidemia    chose to stop statin, takes Zetia  . Hypertension    not currently on medication  . IBS (irritable bowel syndrome)   . PVD (peripheral vascular disease) ([place])    a. moderate to severely decreased ABI's in 8/08, sig. aortic inflow disease b. repeat ABI's in 2011 improved- mild disease on the left and moderate to severe disease on the right  . S/P placement of cardiac pacemaker    a. 03/24/16: St. Jude (serial number [id number]) pacemaker  . TIA (transient ischemic attack) 2012  . Tobacco abuse    a. 50 pack years continuing at 1/2 pack per day  Past Surgical History:  Procedure Laterality Date  . ABDOMINAL HYSTERECTOMY    . carpel tunnel release     bilateral  . CHOLECYSTECTOMY    . COLONOSCOPY  2009  . COLONOSCOPY N/A 08/04/2016   Procedure: COLONOSCOPY;  Surgeon: Danie Binder, MD;  [facility];  Service: Endoscopy;  Laterality: N/A;  . COLONOSCOPY N/A 08/05/2016   Procedure: COLONOSCOPY;  Surgeon: Danie Binder, MD;  Location: [facility];  Service: Endoscopy;  Laterality: N/A;  . EP IMPLANTABLE DEVICE N/A 03/24/2016   Procedure: Pacemaker Implant;  Surgeon: [name], MD;  Location: [facility];  Service: Cardiovascular;  Laterality: N/A;  . ESOPHAGOGASTRODUODENOSCOPY N/A 08/04/2016   Procedure: ESOPHAGOGASTRODUODENOSCOPY (EGD);  Surgeon: Danie Binder, MD;  [facility];  Service: Endoscopy;  Laterality: N/A;  . TOTAL ABDOMINAL HYSTERECTOMY W/ BILATERAL SALPINGOOPHORECTOMY        Social History:      Social History   Substance Use Topics  . Smoking status: Current Every Day Smoker    Packs/day: 0.50    Years: 50.00    Types: Cigarettes    Start date: 02/02/1966  . Smokeless tobacco: Never Used     Comment: 1/2 pack daily  . Alcohol use No       Family History :     Family History  Problem Relation Age of Onset  . Liver disease Mother 4  . Hypertension Mother   . Cerebral aneurysm Father   . Aneurysm Father 27  . Cancer Sister   . Liver disease Brother   . Liver disease Brother   . Liver disease Brother   . Diabetes type II Brother   . Alcohol abuse Brother   . Cancer Sister       Home Medications:   Prior to Admission medications   Medication Sig Start Date End Date Taking? Authorizing Provider  acetaminophen (TYLENOL) 500 MG tablet Take 500 mg by mouth every 6 (six) hours as needed for mild pain or moderate pain.    Historical Provider, MD  albuterol (PROVENTIL HFA;VENTOLIN HFA) 108 (90 Base) MCG/ACT inhaler Inhale 2 puffs into the lungs every 4 (four) hours as needed for wheezing or shortness of breath. 07/23/15   Alycia Rossetti, MD  ALPRAZolam Duanne Moron) 0.25 MG tablet Take 1 tablet (0.25 mg total) by mouth at bedtime as needed. 06/14/16   Alycia Rossetti, MD  calcium citrate-vitamin D (CITRACAL+D) 315-200 MG-UNIT per tablet Take 1 tablet by mouth daily.      Historical Provider, MD  Cranberry 250 MG CAPS Take 250 mg by mouth daily.    Historical Provider, MD  Cyanocobalamin (VITAMIN B 12 PO) Take 1 tablet by mouth daily.     Historical Provider, MD  esomeprazole (NEXIUM) 40 MG capsule Take 1 capsule (40 mg total) by mouth daily before breakfast. Patient taking differently: Take 40 mg by mouth 2 (two) times daily before a meal.  08/02/16   Mahala Menghini, PA-C  feeding supplement, GLUCERNA SHAKE, (GLUCERNA SHAKE) LIQD Take 237 mLs by mouth 2 (two) times daily between meals. 08/05/16 09/04/16  Clanford Marisa Hua, MD  ferrous sulfate 325 (65 FE) MG tablet Take 325 mg by mouth daily with  breakfast.    Historical Provider, MD  Fluticasone-Salmeterol (ADVAIR) 100-50 MCG/DOSE AEPB Inhale 1 puff into the lungs 2 (two) times daily. Patient taking differently: Inhale 1 puff into the lungs 2 (two) times daily as needed (Shortness of breath).  12/15/15  Alycia Rossetti, MD  gabapentin (NEURONTIN) 100 MG capsule Take 1-2 capsules at bedtime for nerve pain Patient taking differently: Take 100 mg by mouth at bedtime. Take 1-2 capsules at bedtime for nerve pain 06/14/16   Alycia Rossetti, MD  lubiprostone (AMITIZA) 8 MCG capsule Take 8 mcg by mouth daily with breakfast.    Historical Provider, MD  mirabegron ER (MYRBETRIQ) 25 MG TB24 tablet Take 1 tablet (25 mg total) by mouth at bedtime. For bladder Patient taking differently: Take 25 mg by mouth daily as needed (bladder). For bladder 07/23/15   Alycia Rossetti, MD  Multiple Vitamin (MULTIVITAMIN WITH MINERALS) TABS tablet Take 1 tablet by mouth daily. 08/05/16   Clanford Marisa Hua, MD  Probiotic Product (PROBIOTIC DAILY PO) Take 1 capsule by mouth daily.     Historical Provider, MD  promethazine (PHENERGAN) 12.5 MG tablet 1-2 po 30 MINS PRIOR TO MEDS OR MEALS. MAY REPEAT EVERY 4-6 h prn nausea or vomiting Patient not taking: Reported on 08/09/2016 06/23/16   Danie Binder, MD  Tiotropium Bromide Monohydrate (SPIRIVA RESPIMAT) 1.25 MCG/ACT AERS Inhale 2 puffs into the lungs daily. 06/14/16   Alycia Rossetti, MD  ZETIA 10 MG tablet TAKE 1 TABLET BY MOUTH ONCE A DAY. 02/29/16   Herminio Commons, MD     Allergies:     Allergies  Allergen Reactions  . Ace Inhibitors Hives, Shortness Of Breath and Swelling  . Aspirin Other (See Comments)    Blood in stool and nose bleed  . Codeine Swelling  . Pineapple Shortness Of Breath and Swelling  . Plasticized Base [Plastibase] Hives and Swelling    No plastics  . Ultram [Tramadol] Swelling  . Adhesive [Tape] Swelling and Rash  . Naprosyn [Naproxen] Swelling  . Biaxin [Clarithromycin] Nausea And  Vomiting and Other (See Comments)    Dizziness.  . Clindamycin/Lincomycin Other (See Comments)    Burning sensation throat, abdomen  . Linzess [Linaclotide]     DIARRHEA, FECAL INCONTINENCE  . Metronidazole     NAUSEA AND WEAKNESS  . Nsaids   . Penicillins     Lip swelling, Hives Has patient had a PCN reaction causing immediate rash, facial/tongue/throat swelling, SOB or lightheadedness with hypotension:YES Has patient had a PCN reaction causing severe rash involving mucus membranes or skin necrosis: NO Has patient had a PCN reaction that required hospitalization NO Has patient had a PCN reaction occurring within the last 10 years: NO If all of the above answers are "NO", then may proceed with Cephalosporin use.  . Varenicline Tartrate Swelling     Physical Exam:   Vitals  Blood pressure (!) 102/49, pulse 66, temperature 98.6 F (37 C), temperature source Oral, resp. rate 17, height '5\' 7"'$  (1.702 m), weight 64.4 kg (142 lb), SpO2 100 %.  1.  General: Appears in no acute distress  2. Psychiatric:  Intact judgement and  insight, awake alert, oriented x 3.  3. Neurologic: No focal neurological deficits, all cranial nerves intact.Strength 5/5 all 4 extremities, sensation intact all 4 extremities, plantars down going.  4. Eyes :  anicteric sclerae, moist conjunctivae with no lid lag. PERRLA.  5. ENMT:  Oropharynx clear with moist mucous membranes and good dentition  6. Neck:  supple, no cervical lymphadenopathy appriciated, No thyromegaly  7. Respiratory : Normal respiratory effort, good air movement bilaterally,clear to  auscultation bilaterally  8. Cardiovascular : RRR, no gallops, rubs or murmurs, no leg edema  9. Gastrointestinal:  Positive bowel  sounds, abdomen soft, non-tender to palpation,no hepatosplenomegaly, no rigidity or guarding       10. Skin:  No cyanosis, normal texture and turgor, no rash, lesions or ulcers  11.Musculoskeletal:  Good muscle tone,   joints appear normal , no effusions,  normal range of motion    Data Review:    CBC  Recent Labs Lab 08/09/16 1439 08/15/16 0048  WBC 8.1 9.6  HGB 8.1* 5.4*  HCT 26.3* 16.1*  PLT 163 230  MCV 94.6 95.8  MCH 29.1 32.1  MCHC 30.8* 33.5  RDW 16.7* 17.8*  LYMPHSABS  --  2.4  MONOABS  --  0.5  EOSABS  --  0.1  BASOSABS  --  0.0   ------------------------------------------------------------------------------------------------------------------  Chemistries   Recent Labs Lab 08/09/16 1439 08/15/16 0048  NA 141 140  K 4.8 4.0  CL 108 109  CO2 28 26  GLUCOSE 100* 176*  BUN 30* 41*  CREATININE 1.05* 1.14*  CALCIUM 9.2 8.8*   ------------------------------------------------------------------------------------------------------------------  ------------------------------------------------------------------------Cardiac Enzymes:  Recent Labs Lab 08/15/16 0048  TROPONINI <0.03    --------------------------------------------------------------------------------------------------------------- Urine analysis:    Component Value Date/Time   COLORURINE YELLOW 08/02/2016 1510   APPEARANCEUR HAZY (A) 08/02/2016 1510   LABSPEC 1.018 08/02/2016 1510   PHURINE 5.0 08/02/2016 1510   GLUCOSEU NEGATIVE 08/02/2016 1510   HGBUR NEGATIVE 08/02/2016 1510   BILIRUBINUR NEGATIVE 08/02/2016 1510   KETONESUR NEGATIVE 08/02/2016 1510   PROTEINUR NEGATIVE 08/02/2016 1510   UROBILINOGEN 2 (H) 12/21/2014 1455   NITRITE NEGATIVE 08/02/2016 1510   LEUKOCYTESUR NEGATIVE 08/02/2016 1510      Imaging Results:    Dg Chest Portable 1 View  Result Date: 08/15/2016 CLINICAL DATA:  Weakness for 2 weeks. Shortness of breath. Upper mid back pain. History of diabetes and hypertension. EXAM: PORTABLE CHEST 1 VIEW COMPARISON:  08/02/2016 FINDINGS: Cardiac pacemaker. Shallow inspiration. Normal heart size and pulmonary vascularity. No focal airspace disease or consolidation in the lungs. No  blunting of costophrenic angles. No pneumothorax. Mediastinal contours appear intact. Calcified and tortuous aorta. Degenerative changes in the shoulders. IMPRESSION: No active disease. Electronically Signed   By: Lucienne Capers M.D.   On: 08/15/2016 01:03       Assessment & Plan:    Active Problems:   Essential hypertension   Chronic kidney disease (CKD), stage III (moderate)   CHB (complete heart block) ([facility])   Anemia   1. Symptomatic anemia- secondary to GI bleed, FOBT is positive. Will transfuse 2 units PRBC. Check CBC in a.m. 2. GI bleed- no clear source identified after EGD and colonoscopy during previous admission, will consult GI in a.m. Patient might benefit from capsule endoscopy to look for small bowel AVM, as cause of patient's recurrent bleeding. Patient was on Xarelto which was stopped during previous admission. Will check PT, PTT. 3. Hypertension- blood pressure is is labile, will monitor closely and stepdown unit. Currently not on antihypertensive medications 4. Chronic kidney stage III- creatinine is stable at 1.14, follow BMP in a.m.   DVT Prophylaxis-   SCD  AM Labs Ordered, also please review Full Orders  Family Communication: Admission, patients condition and plan of care including tests being ordered have been discussed with the patient and her daughter at bedside who indicate understanding and agree with the plan and Code Status.  Code Status:  Full code  Admission status: Observation  Time spent in minutes : 60 min   Jerric Oyen S M.D on 08/15/2016 at 2:34 AM  Between 7am to  7pm - Pager - [phone]. After 7pm go to [email or web address] - password [facility]  [facility] - Office  [phone]

## 2016-08-15 NOTE — ED Notes (Signed)
Pt assisted to bathroom and back to bed; pt given another warm blanket

## 2016-08-15 NOTE — ED Provider Notes (Signed)
[facility] DEPT Provider Note   CSN: [id number] Arrival date & time: 08/15/16  0010  By signing my name below, I, Dolores Hoose, attest that this documentation has been prepared under the direction and in the presence of Rolland Porter, MD . Electronically Signed: Dolores Hoose, Scribe. 08/15/2016. 12:48 AM.  Time seen 12:53 AM  History   Chief Complaint Chief Complaint  Patient presents with  . Weakness   The history is provided by the patient and a relative. No language interpreter was used.    HPI Comments:  Charlotte Henry is a 76 y.o. female with pmhx of CKD, COPD, DM, diverticulosis and TIA who presents to the Emergency Department complaining of constant, returned weakness which began earlier today. She reports associated fatigue, SOB on exertion, and weakness, needing assistance to walk. Pt states she has been having similar symptoms for the past 2 weeks and was admitted on February 27 - March 2 due to low hemoglobin of about 5. She was discharged on August 04, 2016 after getting her hemoglobin up to 9 after 4 units of blood per her daughter. Pt was then seen again on March 6th at her PCP office  where it was discovered that her hemoglobin had gone down again to 8.1. She has an appointment later this morning with Dr Oneida Alar, Gastroenterologist. She had a EDG and Colonoscopy while in the hospital with no source found for the bleeding.  She denies any abdominal pain, bloody stool or nausea. She has DOE, but if she sits the SOB gets better. She states she did have one melanotic stool after she was discharged from the hospital. Pt has a pacemaker and was taken off her blood-thinning medication, Xarelto, two weeks ago.   PCP [name], MD GI Dr Oneida Alar  Past Medical History:  Diagnosis Date  . 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 ASCVD (arteriosclerotic cardiovascular disease)    a. cath in 4/04,-80%LAD; 70% RCA, -> DESx2; residual 60% distal RCA; nl EF  b. Nuc 02/2016 aborted due  to bradycardia. Will need to be rescheduled   . Carotid artery occlusion   . Cerebrovascular disease   . CHB (complete heart block) ([employer])    a. s/p PPM on 03/24/16 with a St. Jude (serial number [id number]) pacemaker  . Chronic kidney disease (CKD), stage III (moderate)   . Clostridium difficile colitis 03/2013   a. 03/2013  . COPD (chronic obstructive pulmonary disease) ([place])   . Coronary disease   . Diabetes mellitus   . Diverticulosis   . GERD (gastroesophageal reflux disease)   . Gout   . H pylori ulcer   . Hyperlipidemia    chose to stop statin, takes Zetia  . Hypertension    not currently on medication  . IBS (irritable bowel syndrome)   . PVD (peripheral vascular disease) ([place])    a. moderate to severely decreased ABI's in 8/08, sig. aortic inflow disease b. repeat ABI's in 2011 improved- mild disease on the left and moderate to severe disease on the right  . S/P placement of cardiac pacemaker    a. 03/24/16: St. Jude (serial number [id number]) pacemaker  . TIA (transient ischemic attack) 2012  . Tobacco abuse    a. 50 pack years continuing at 1/2 pack per day    Patient Active Problem List   Diagnosis Date Noted  . Anemia 08/15/2016  . Protein-calorie malnutrition, severe 08/04/2016  . Helicobacter pylori gastritis 07/26/2016  . Generalized weakness 07/09/2016  . Normocytic anemia 07/03/2016  .  CHB (complete heart block) ([place])   . COPD (chronic obstructive pulmonary disease) ([place])   . Tobacco use disorder 10/15/2015  . Back pain 12/30/2014  . OA (osteoarthritis) 12/30/2014  . Carotid stenosis 03/11/2014  . PVD (peripheral vascular disease) ([place]) 03/11/2014  . Colon cancer screening 02/04/2014  . Overactive bladder 12/31/2013  . Rash and nonspecific skin eruption 05/19/2013  . Numbness and tingling-bilat foot 03/05/2013  . IBS (irritable bowel syndrome) 12/30/2012  . Peripheral neuropathy ([place]) 07/01/2012  . Insomnia 07/01/2012  . Chronic kidney disease (CKD), stage  III (moderate) 08/25/2010  . Cardiovascular disease 08/16/2009  . Cerebrovascular disease 08/16/2009  . Peripheral vascular disease ([place]) 08/16/2009  . Diabetes mellitus ([place]) 10/26/2008  . Hyperlipidemia 10/26/2008  . Gout, unspecified 10/26/2008  . Essential hypertension 10/26/2008  . Asthmatic bronchitis 10/26/2008  . Gastroesophageal reflux disease 10/26/2008    Past Surgical History:  Procedure Laterality Date  . ABDOMINAL HYSTERECTOMY    . carpel tunnel release     bilateral  . CHOLECYSTECTOMY    . COLONOSCOPY  2009  . COLONOSCOPY N/A 08/04/2016   Procedure: COLONOSCOPY;  Surgeon: Danie Binder, MD;  Location[facility];  Service: Endoscopy;  Laterality: N/A;  . COLONOSCOPY N/A 08/05/2016   Procedure: COLONOSCOPY;  Surgeon: Danie Binder, MD;  Location: [facility];  Service: Endoscopy;  Laterality: N/A;  . EP IMPLANTABLE DEVICE N/A 03/24/2016   Procedure: Pacemaker Implant;  Surgeon: [name], MD;  Location: [facility];  Service: Cardiovascular;  Laterality: N/A;  . ESOPHAGOGASTRODUODENOSCOPY N/A 08/04/2016   Procedure: ESOPHAGOGASTRODUODENOSCOPY (EGD);  Surgeon: Danie Binder, MD;  Location[facility];  Service: Endoscopy;  Laterality: N/A;  . TOTAL ABDOMINAL HYSTERECTOMY W/ BILATERAL SALPINGOOPHORECTOMY      OB History    Gravida Para Term Preterm AB Living   '2 2 2         '$ SAB TAB Ectopic Multiple Live Births                   Home Medications    Prior to Admission medications   Medication Sig Start Date End Date Taking? Authorizing Provider  acetaminophen (TYLENOL) 500 MG tablet Take 500 mg by mouth every 6 (six) hours as needed for mild pain or moderate pain.    Historical Provider, MD  albuterol (PROVENTIL HFA;VENTOLIN HFA) 108 (90 Base) MCG/ACT inhaler Inhale 2 puffs into the lungs every 4 (four) hours as needed for wheezing or shortness of breath. 07/23/15   Alycia Rossetti, MD  ALPRAZolam Duanne Moron) 0.25 MG tablet Take 1 tablet (0.25 mg  total) by mouth at bedtime as needed. 06/14/16   Alycia Rossetti, MD  calcium citrate-vitamin D (CITRACAL+D) 315-200 MG-UNIT per tablet Take 1 tablet by mouth daily.      Historical Provider, MD  Cranberry 250 MG CAPS Take 250 mg by mouth daily.    Historical Provider, MD  Cyanocobalamin (VITAMIN B 12 PO) Take 1 tablet by mouth daily.     Historical Provider, MD  esomeprazole (NEXIUM) 40 MG capsule Take 1 capsule (40 mg total) by mouth daily before breakfast. Patient taking differently: Take 40 mg by mouth 2 (two) times daily before a meal.  08/02/16   Mahala Menghini, PA-C  feeding supplement, GLUCERNA SHAKE, (GLUCERNA SHAKE) LIQD Take 237 mLs by mouth 2 (two) times daily between meals. 08/05/16 09/04/16  Clanford Marisa Hua, MD  ferrous sulfate 325 (65 FE) MG tablet Take 325 mg by mouth  daily with breakfast.    Historical Provider, MD  Fluticasone-Salmeterol (ADVAIR) 100-50 MCG/DOSE AEPB Inhale 1 puff into the lungs 2 (two) times daily. Patient taking differently: Inhale 1 puff into the lungs 2 (two) times daily as needed (Shortness of breath).  12/15/15   Alycia Rossetti, MD  gabapentin (NEURONTIN) 100 MG capsule Take 1-2 capsules at bedtime for nerve pain Patient taking differently: Take 100 mg by mouth at bedtime. Take 1-2 capsules at bedtime for nerve pain 06/14/16   Alycia Rossetti, MD  lubiprostone (AMITIZA) 8 MCG capsule Take 8 mcg by mouth daily with breakfast.    Historical Provider, MD  mirabegron ER (MYRBETRIQ) 25 MG TB24 tablet Take 1 tablet (25 mg total) by mouth at bedtime. For bladder Patient taking differently: Take 25 mg by mouth daily as needed (bladder). For bladder 07/23/15   Alycia Rossetti, MD  Multiple Vitamin (MULTIVITAMIN WITH MINERALS) TABS tablet Take 1 tablet by mouth daily. 08/05/16   Clanford Marisa Hua, MD  Probiotic Product (PROBIOTIC DAILY PO) Take 1 capsule by mouth daily.     Historical Provider, MD  promethazine (PHENERGAN) 12.5 MG tablet 1-2 po 30 MINS PRIOR TO MEDS OR  MEALS. MAY REPEAT EVERY 4-6 h prn nausea or vomiting Patient not taking: Reported on 08/09/2016 06/23/16   Danie Binder, MD  Tiotropium Bromide Monohydrate (SPIRIVA RESPIMAT) 1.25 MCG/ACT AERS Inhale 2 puffs into the lungs daily. 06/14/16   Alycia Rossetti, MD  ZETIA 10 MG tablet TAKE 1 TABLET BY MOUTH ONCE A DAY. 02/29/16   Herminio Commons, MD    Family History Family History  Problem Relation Age of Onset  . Liver disease Mother 21  . Hypertension Mother   . Cerebral aneurysm Father   . Aneurysm Father 57  . Cancer Sister   . Liver disease Brother   . Liver disease Brother   . Liver disease Brother   . Diabetes type II Brother   . Alcohol abuse Brother   . Cancer Sister     Social History Social History  Substance Use Topics  . Smoking status: Current Every Day Smoker    Packs/day: 0.50    Years: 50.00    Types: Cigarettes    Start date: 02/02/1966  . Smokeless tobacco: Never Used     Comment: 1/2 pack daily  . Alcohol use No  GD lives with her Has started using a walker and wheelchair b/o weakness   Allergies   Ace inhibitors; Aspirin; Codeine; Pineapple; Plasticized base [plastibase]; Ultram [tramadol]; Adhesive [tape]; Naprosyn [naproxen]; Biaxin [clarithromycin]; Clindamycin/lincomycin; Linzess [linaclotide]; Metronidazole; Nsaids; Penicillins; and Varenicline tartrate   Review of Systems Review of Systems  Constitutional: Positive for fatigue.  Respiratory: Positive for shortness of breath.   Cardiovascular: Negative for chest pain.  Neurological: Positive for weakness.  All other systems reviewed and are negative.    Physical Exam Updated Vital Signs BP (!) 119/106 (BP Location: Left Arm)   Pulse 86   Temp 98.6 F (37 C) (Oral)   Resp 15   Ht '5\' 7"'$  (1.702 m)   Wt 142 lb (64.4 kg)   SpO2 100%   BMI 22.24 kg/m   Vital signs normal except for diastolic hypertension   Physical Exam  Constitutional: She is oriented to person, place, and time.  She appears well-developed and well-nourished.  Non-toxic appearance. She does not appear ill. No distress.  HENT:  Head: Normocephalic and atraumatic.  Right Ear: External ear normal.  Left Ear:  External ear normal.  Nose: Nose normal. No mucosal edema or rhinorrhea.  Mouth/Throat: Oropharynx is clear and moist and mucous membranes are normal. No dental abscesses or uvula swelling.  Pale mucous membranes.   Eyes: EOM are normal. Pupils are equal, round, and reactive to light.  Pale conjunctiva  Neck: Normal range of motion and full passive range of motion without pain. Neck supple.  Cardiovascular: Normal rate and regular rhythm.  Exam reveals no gallop and no friction rub.   Murmur heard. Systolic murmur best heard in left upper sternal border  Pulmonary/Chest: Effort normal and breath sounds normal. No respiratory distress. She has no wheezes. She has no rhonchi. She has no rales. She exhibits no tenderness and no crepitus.  Abdominal: Soft. Normal appearance and bowel sounds are normal. She exhibits no distension. There is no tenderness. There is no rebound and no guarding.  Musculoskeletal: Normal range of motion. She exhibits no edema or tenderness.  Moves all extremities well.   Neurological: She is alert and oriented to person, place, and time. She has normal strength. No cranial nerve deficit.  Skin: Skin is warm, dry and intact. No rash noted. No erythema. No pallor.  Palms are pale  Psychiatric: She has a normal mood and affect. Her speech is normal and behavior is normal. Her mood appears not anxious.  Nursing note and vitals reviewed.    ED Treatments / Results  Labs (all labs ordered are listed, but only abnormal results are displayed) Results for orders placed or performed during the hospital encounter of 08/15/16  CBC with Differential  Result Value Ref Range   WBC 9.6 4.0 - 10.5 K/uL   RBC 1.68 (L) 3.87 - 5.11 MIL/uL   Hemoglobin 5.4 (LL) 12.0 - 15.0 g/dL   HCT  16.1 (L) 36.0 - 46.0 %   MCV 95.8 78.0 - 100.0 fL   MCH 32.1 26.0 - 34.0 pg   MCHC 33.5 30.0 - 36.0 g/dL   RDW 17.8 (H) 11.5 - 15.5 %   Platelets 230 150 - 400 K/uL   Neutrophils Relative % 68 %   Neutro Abs 6.5 1.7 - 7.7 K/uL   Lymphocytes Relative 25 %   Lymphs Abs 2.4 0.7 - 4.0 K/uL   Monocytes Relative 6 %   Monocytes Absolute 0.5 0.1 - 1.0 K/uL   Eosinophils Relative 1 %   Eosinophils Absolute 0.1 0.0 - 0.7 K/uL   Basophils Relative 0 %   Basophils Absolute 0.0 0.0 - 0.1 K/uL  Basic metabolic panel  Result Value Ref Range   Sodium 140 135 - 145 mmol/L   Potassium 4.0 3.5 - 5.1 mmol/L   Chloride 109 101 - 111 mmol/L   CO2 26 22 - 32 mmol/L   Glucose, Bld 176 (H) 65 - 99 mg/dL   BUN 41 (H) 6 - 20 mg/dL   Creatinine, Ser 1.14 (H) 0.44 - 1.00 mg/dL   Calcium 8.8 (L) 8.9 - 10.3 mg/dL   GFR calc non Af Amer 45 (L) >60 mL/min   GFR calc Af Amer 52 (L) >60 mL/min   Anion gap 5 5 - 15  Troponin I  Result Value Ref Range   Troponin I <0.03 <0.03 ng/mL  Brain natriuretic peptide  Result Value Ref Range   B Natriuretic Peptide 45.0 0.0 - 100.0 pg/mL  Protime-INR  Result Value Ref Range   Prothrombin Time 13.4 11.4 - 15.2 seconds   INR 1.02   APTT  Result Value Ref Range  aPTT 25 24 - 36 seconds  POC occult blood, ED RN will collect  Result Value Ref Range   Fecal Occult Bld POSITIVE (A) NEGATIVE  Type and screen [facility]  Result Value Ref Range   ABO/RH(D) O POS    Antibody Screen NEG    Sample Expiration 08/18/2016    Unit Number [id number]    Blood Component Type RED CELLS,LR    Unit division 00    Status of Unit ISSUED    Transfusion Status OK TO TRANSFUSE    Crossmatch Result Compatible    Unit Number [id number]    Blood Component Type RED CELLS,LR    Unit division 00    Status of Unit ALLOCATED    Transfusion Status OK TO TRANSFUSE    Crossmatch Result Compatible   Prepare RBC  Result Value Ref Range   Order Confirmation ORDER PROCESSED  BY BLOOD BANK   BPAM RBC  Result Value Ref Range   ISSUE DATE / TIME [id number]    Blood Product Unit Number [id number]    PRODUCT CODE Y1017P10    Unit Type and Rh 5100    Blood Product Expiration Date 258527782423    Blood Product Unit Number [id number]    Unit Type and Rh 5100    Blood Product Expiration Date 676195093267    Laboratory interpretation all normal except anemia, renal insufficiency, elevated BUN c/w GI bleeding, + hemoccult   EKG  EKG Interpretation  Date/Time:  Tuesday August 15 2016 00:32:52 EDT Ventricular Rate:  80 PR Interval:    QRS Duration: 142 QT Interval:  452 QTC Calculation: 522 R Axis:   -76 Text Interpretation:  Atrial-sensed ventricular-paced rhythm No further analysis attempted due to paced rhythm No significant change since last tracing 02 Aug 2016 Confirmed by Tavon Magnussen  MD-I, Kyren Knick ([id number]) on 08/15/2016 6:42:33 AM       Radiology Dg Chest Portable 1 View  Result Date: 08/15/2016 CLINICAL DATA:  Weakness for 2 weeks. Shortness of breath. Upper mid back pain. History of diabetes and hypertension. EXAM: PORTABLE CHEST 1 VIEW COMPARISON:  08/02/2016 FINDINGS: Cardiac pacemaker. Shallow inspiration. Normal heart size and pulmonary vascularity. No focal airspace disease or consolidation in the lungs. No blunting of costophrenic angles. No pneumothorax. Mediastinal contours appear intact. Calcified and tortuous aorta. Degenerative changes in the shoulders. IMPRESSION: No active disease. Electronically Signed   By: Lucienne Capers M.D.   On: 08/15/2016 01:03    Procedures Procedures (including critical care time)  CRITICAL CARE Performed by: Yarelli Decelles L Waynetta Metheny Total critical care time: 35 minutes Critical care time was exclusive of separately billable procedures and treating other patients. Critical care was necessary to treat or prevent imminent or life-threatening deterioration. Critical care was time spent personally by me on the following  activities: development of treatment plan with patient and/or surrogate as well as nursing, discussions with consultants, evaluation of patient's response to treatment, examination of patient, obtaining history from patient or surrogate, ordering and performing treatments and interventions, ordering and review of laboratory studies, ordering and review of radiographic studies, pulse oximetry and re-evaluation of patient's condition.    Medications Ordered in ED Medications  ALPRAZolam (XANAX) tablet 0.25 mg (not administered)  gabapentin (NEURONTIN) capsule 100 mg (not administered)  mirabegron ER (MYRBETRIQ) tablet 25 mg (not administered)  0.9 %  sodium chloride infusion (not administered)  ondansetron (ZOFRAN) tablet 4 mg (not administered)    Or  ondansetron (ZOFRAN) injection 4 mg (not administered)  0.9 %  sodium chloride infusion ( Intravenous New Bag/Given 08/15/16 0359)     Initial Impression / Assessment and Plan / ED Course  I have reviewed the triage vital signs and the nursing notes.  Pertinent labs & imaging results that were available during my care of the patient were reviewed by me and considered in my medical decision making (see chart for details).     COORDINATION OF CARE:  1:02 AM Discussed treatment plan with pt at bedside which includes blood transfusion and admission and pt agreed to plan. Patient appeared to be anemic again on physical exam. We discussed that she would most likely need another blood transfusion and admission. Patient and daughter are very concerned that they have been unable to find the source of bleeding. Especially after she stopped the xarelto. I explained that sometimes the bleeding is intermittent and can be hard to find. There are some other testing can be done to try to clarify where the bleeding is from.  2 units of PRC's ordered, 1 unit of PRC's was ordered to be transfused.   02:00 AM Dr Darrick Meigs will admit   Final Clinical  Impressions(s) / ED Diagnoses   Final diagnoses:  Generalized weakness  Symptomatic anemia  Gastrointestinal hemorrhage with melena    Plan admission  Rolland Porter, MD, FACEP    I personally performed the services described in this documentation, which was scribed in my presence. The recorded information has been reviewed and considered.  Rolland Porter, MD, Barbette Or, MD 08/15/16 [phone]

## 2016-08-15 NOTE — Progress Notes (Signed)
Patient admitted to the hospital earlier this morning by Dr. Darrick Meigs.  Patient seen and examined. No abdominal pain, no shortness of breath. Physical exam is unrevealing.  Patient was recently discharged on 08/05/16 after being evaluated for GI bleeding. She comes back to ED with symptomatic anemia and hemoglobin of 5.4. She is being transfused 2 unit prbc. She has not had any dark/bloody stools since discharge. Anticoagulation had been discontinued on last admission. She had EGD/colonoscopy done last admission that did not reveal source of bleeding. Stools positive for occult blood in ED. ?need for capsule study. GI has been consulted. Continue to follow hemoglobin.  [name],Charlotte Henry

## 2016-08-16 ENCOUNTER — Encounter (HOSPITAL_COMMUNITY)
Admission: EM | Disposition: A | Discharge status: Home / Self Care | Payer: Self-pay | Source: Home / Self Care | Attending: Family Medicine

## 2016-08-16 DIAGNOSIS — I442 Atrioventricular block, complete: Secondary | ICD-10-CM

## 2016-08-16 DIAGNOSIS — I10 Essential (primary) hypertension: Secondary | ICD-10-CM

## 2016-08-16 DIAGNOSIS — K922 Gastrointestinal hemorrhage, unspecified: Secondary | ICD-10-CM

## 2016-08-16 DIAGNOSIS — N183 Chronic kidney disease, stage 3 (moderate): Secondary | ICD-10-CM

## 2016-08-16 DIAGNOSIS — R531 Weakness: Secondary | ICD-10-CM

## 2016-08-16 HISTORY — PX: GIVENS CAPSULE STUDY: SHX5432

## 2016-08-16 LAB — CBC
HCT: 23.8 % — ABNORMAL LOW (ref 36.0–46.0)
Hemoglobin: 8 g/dL — ABNORMAL LOW (ref 12.0–15.0)
MCH: 30.7 pg (ref 26.0–34.0)
MCHC: 33.6 g/dL (ref 30.0–36.0)
MCV: 91.2 fL (ref 78.0–100.0)
PLATELETS: 191 10*3/uL (ref 150–400)
RBC: 2.61 MIL/uL — ABNORMAL LOW (ref 3.87–5.11)
RDW: 17.6 % — AB (ref 11.5–15.5)
WBC: 8.2 10*3/uL (ref 4.0–10.5)

## 2016-08-16 SURGERY — IMAGING PROCEDURE, GI TRACT, INTRALUMINAL, VIA CAPSULE

## 2016-08-16 MED ORDER — PRO-STAT SUGAR FREE PO LIQD
30.0000 mL | Freq: Three times a day (TID) | ORAL | Status: DC
  Administered 2016-08-16 – 2016-08-20 (×5): 30 mL via ORAL
  Filled 2016-08-16 (×7): qty 30

## 2016-08-16 MED ORDER — BOOST / RESOURCE BREEZE PO LIQD
1.0000 | Freq: Three times a day (TID) | ORAL | Status: DC
  Administered 2016-08-16 – 2016-08-20 (×6): 1 via ORAL

## 2016-08-16 MED ORDER — ADULT MULTIVITAMIN W/MINERALS CH
1.0000 | ORAL_TABLET | Freq: Every day | ORAL | Status: DC
  Administered 2016-08-16 – 2016-08-20 (×3): 1 via ORAL
  Filled 2016-08-16 (×4): qty 1

## 2016-08-16 NOTE — Progress Notes (Signed)
Pt refused bed alarm. She said she did not need one. She has not had the alarm on all day and had been getting up on her own. I told her that we were just looking out for her safety. We did not want her to fall. She told me that she was a grown woman and will decide for her self if she needs the alarm or not. Left alarm off since she refused.

## 2016-08-16 NOTE — Progress Notes (Signed)
Subjective:  Patient swallowed capsule this morning without difficulty. No overt GI bleeding. Feels weak.  Objective: Vital signs in last 24 hours: Temp:  [97.9 F (36.6 C)-98.5 F (36.9 C)] 98 F (36.7 C) (03/14 0619) Pulse Rate:  [58-63] 58 (03/14 0619) Resp:  [12-17] 16 (03/14 0619) BP: (106-130)/(44-71) 125/68 (03/14 0619) SpO2:  [98 %-100 %] 100 % (03/14 0732) Weight:  [142 lb 3.2 oz (64.5 kg)] 142 lb 3.2 oz (64.5 kg) (03/13 1014) Last BM Date: 08/14/16 General:   Alert,  Well-developed, well-nourished, pleasant and cooperative in NAD Head:  Normocephalic and atraumatic. Eyes:  Sclera clear, no icterus.  Abdomen:   Soft, NT, nondistended. Small bowel capsule recording device in place.    Extremities:  Without clubbing, deformity or edema. Neurologic:  Alert and  oriented x4;  grossly normal neurologically. Skin:  Intact without significant lesions or rashes. Psych:  Alert and cooperative. Normal mood and affect.  Intake/Output from previous day: 03/13 0701 - 03/14 0700 In: 395 [I.V.:60; Blood:335] Out: -  Intake/Output this shift: No intake/output data recorded.  Lab Results: CBC  Recent Labs  08/15/16 0048 08/15/16 1154  WBC 9.6 9.9  HGB 5.4* 9.0*  HCT 16.1* 26.9*  MCV 95.8 90.6  PLT 230 205   BMET  Recent Labs  08/15/16 0048 08/15/16 1154  NA 140 137  K 4.0 4.0  CL 109 107  CO2 26 24  GLUCOSE 176* 98  BUN 41* 36*  CREATININE 1.14* 1.02*  CALCIUM 8.8* 8.4*   LFTs  Recent Labs  08/15/16 1154  BILITOT 0.8  ALKPHOS 31*  AST 27  ALT 17  PROT 5.7*  ALBUMIN 3.2*   No results for input(s): LIPASE in the last 72 hours. PT/INR  Recent Labs  08/15/16 0048  LABPROT 13.4  INR 1.02      Imaging Studies: Dg Chest 2 View  Result Date: 08/02/2016 CLINICAL DATA:  Weakness, dehydration, and poor p.o. intake. EXAM: CHEST  2 VIEW COMPARISON:  06/28/2016 FINDINGS: Pacemaker remains in place with leads terminating over the right atrium and right  ventricle. Cardiac silhouette is normal in size. Tortuosity and calcification are again noted involving the thoracic aorta. The patient's hand partially obscures the left lung base on the AP radiograph. No airspace consolidation, edema, pleural effusion, or pneumothorax is identified. Mild thoracic spondylosis is noted. IMPRESSION: 1. No active cardiopulmonary disease. 2. Aortic atherosclerosis. Electronically Signed   By: Logan Bores M.D.   On: 08/02/2016 16:24   Dg Chest Portable 1 View  Result Date: 08/15/2016 CLINICAL DATA:  Weakness for 2 weeks. Shortness of breath. Upper mid back pain. History of diabetes and hypertension. EXAM: PORTABLE CHEST 1 VIEW COMPARISON:  08/02/2016 FINDINGS: Cardiac pacemaker. Shallow inspiration. Normal heart size and pulmonary vascularity. No focal airspace disease or consolidation in the lungs. No blunting of costophrenic angles. No pneumothorax. Mediastinal contours appear intact. Calcified and tortuous aorta. Degenerative changes in the shoulders. IMPRESSION: No active disease. Electronically Signed   By: Lucienne Capers M.D.   On: 08/15/2016 01:03  [2 weeks]   Assessment: 76 year old female readmitted with acute blood loss anemia, heme positive stool but without overt GI bleeding, previously admitted for same with colonoscopy/EGD unrevealing. No longer on Xarelto since last admission. Needs evaluation of small bowel, swallowed capsule camara this morning. She has a pacemaker. Prior studies have noted capsule endoscopy performed with pacemakers. Patient is on telemetry.   Has received 2 units PRBCs, with improvement in fatigue and shortness of breath  since admission but remains weak.   H.pylori noted on recent EGD pathology, which will need to be treated at some point in the future after evaluation for acute blood loss anemia.   Plan: 1. Resume clear liquid diet at 10am. May have nutritional supplemental drinks after 4pm.  2. Recheck CBC now. 3. Await  capsule findings, results will be available 08/17/16.   [name]. [name] [facility] [phone] 3/14/20189:18 AM     LOS: 1 day    Addendum: repeat Hgb today, down to 8 grams. But this is an appropriate value after receiving two units of prbcs and started from 5.4 g/dl pretransfusion.   [name]. [name] [facility] [phone] 3/14/201811:46 AM

## 2016-08-16 NOTE — Care Management Note (Signed)
Case Management Note  Patient Details  Name: Charlotte Henry MRN: [id number] Date of Birth: Charlotte Henry  Subjective/Objective:   Patient adm with symptomatic anemia. From home alone, granddaughter does stay with her at times. Ind with ADL's, has walker PTA. Active with Charlotte Henry for PT services. Has PCP, son drives her to appointments, and reports no issues affording medications. Currently undergoing capsule study.                  Action/Plan: Patient will need home health resumption orders for PT.  CM will follow for ongoing needs.    Expected Discharge Date:        08/17/2016          Expected Discharge Plan:  Charlotte Henry  In-House Referral:     Discharge planning Services  CM Consult  Post Acute Henry Choice:  Home Health, Resumption of Svcs/PTA Provider Choice offered to:     DME Arranged:    DME Agency:     HH Arranged:    Charlotte Henry Agency:  Charlotte Henry  Status of Service:  In process, will continue to follow  If discussed at Long Length of Stay Meetings, dates discussed:    Additional Comments:  Charlotte Henry, Charlotte Reading, RN 08/16/2016, 11:17 AM

## 2016-08-16 NOTE — Progress Notes (Signed)
PROGRESS NOTE    Charlotte Henry  KWI:[id number]  DOB: [birth year]  DOA: 08/15/2016 PCP: [name], MD Outpatient Specialists:   Admission History: Charlotte Henry  is a 76 y.o. female, With history of CAD, PVD, GERD, hypertension, CAD, COPD who was just discharged from hospital on 08/05/2016 after patient was admitted for anemia, with guaiac-positive stool. At that time patient underwent EGD and colonoscopy, which did not show significant source of bleeding. EGD showed gastritis and colonoscopy showed hemorrhoids, large diverticulum at hepatic flexure. Biopsy obtained from colonoscopy showed normal colonic mucosa.  Patient was discharged home on 08/05/2016 and hemoglobin at that time was 9.3. Repeat hemoglobin on 08/09/2016 at PCP office was 8.1.  Today patient came to ED as she has been complaining of shortness of breath on exertion, generalized weakness and fatigue. She denies chest pain. No nausea vomiting or diarrhea. No black colored stool. She denies seeing Frank blood in stool.  Assessment & Plan:   1. Symptomatic anemia- secondary to GI bleed, FOBT is positive. Transfused 2 units PRBC.  Hg now up to 9. S/p capsule study 3/14, results 3/15.   2. GI bleed- no clear source identified after EGD and colonoscopy during previous admission. GI following s/p capsule endoscopy which is pending.  Patient was on Xarelto which was stopped during previous admission.  3. Hypertension- blood pressure is improving. Currently not on antihypertensive medications 4. Chronic kidney stage III- creatinine is stable   DVT Prophylaxis-   SCD  AM Labs Ordered, also please review Full Orders  Family Communication: Admission, patients condition and plan of care including tests being ordered have been discussed with the patient and her daughter at bedside who indicate understanding and agree with the plan and Code Status.  Code Status:  Full code  Subjective: Pt reports that she is feeling  weak.    Objective: Vitals:   08/15/16 1941 08/15/16 2200 08/16/16 0619 08/16/16 0732  BP:  123/60 125/68   Pulse:  (!) 59 (!) 58   Resp:  16 16   Temp:  98.2 F (36.8 C) 98 F (36.7 C)   TempSrc:  Oral Oral   SpO2: 100% 100% 98% 100%  Weight:      Height:        Intake/Output Summary (Last 24 hours) at 08/16/16 1109 Last data filed at 08/16/16 0347  Gross per 24 hour  Intake               60 ml  Output                0 ml  Net               60 ml   Filed Weights   08/15/16 0024 08/15/16 1014  Weight: 64.4 kg (142 lb) 64.5 kg (142 lb 3.2 oz)    Exam:  General exam: awake, alert, NAD. Cooperative.  Respiratory system: Clear. No increased work of breathing. Cardiovascular system: S1 & S2 heard.  No JVD, murmurs, gallops, clicks or pedal edema. Gastrointestinal system: Abdomen is nondistended, soft and nontender. Normal bowel sounds heard. Central nervous system: Alert and oriented. No focal neurological deficits. Extremities: no CCE.  Data Reviewed: Basic Metabolic Panel:  Recent Labs Lab 08/09/16 1439 08/15/16 0048 08/15/16 1154  NA 141 140 137  K 4.8 4.0 4.0  CL 108 109 107  CO2 '28 26 24  '$ GLUCOSE 100* 176* 98  BUN 30* 41* 36*  CREATININE 1.05* 1.14* 1.02*  CALCIUM 9.2 8.8*  8.4*   Liver Function Tests:  Recent Labs Lab 08/15/16 1154  AST 27  ALT 17  ALKPHOS 31*  BILITOT 0.8  PROT 5.7*  ALBUMIN 3.2*   No results for input(s): LIPASE, AMYLASE in the last 168 hours. No results for input(s): AMMONIA in the last 168 hours. CBC:  Recent Labs Lab 08/09/16 1439 08/15/16 0048 08/15/16 1154 08/16/16 0927  WBC 8.1 9.6 9.9 8.2  NEUTROABS  --  6.5  --   --   HGB 8.1* 5.4* 9.0* 8.0*  HCT 26.3* 16.1* 26.9* 23.8*  MCV 94.6 95.8 90.6 91.2  PLT 163 230 205 191   Cardiac Enzymes:  Recent Labs Lab 08/15/16 0048  TROPONINI <0.03   CBG (last 3)  No results for input(s): GLUCAP in the last 72 hours. No results found for this or any previous visit  (from the past 240 hour(s)).   Studies: Dg Chest Portable 1 View  Result Date: 08/15/2016 CLINICAL DATA:  Weakness for 2 weeks. Shortness of breath. Upper mid back pain. History of diabetes and hypertension. EXAM: PORTABLE CHEST 1 VIEW COMPARISON:  08/02/2016 FINDINGS: Cardiac pacemaker. Shallow inspiration. Normal heart size and pulmonary vascularity. No focal airspace disease or consolidation in the lungs. No blunting of costophrenic angles. No pneumothorax. Mediastinal contours appear intact. Calcified and tortuous aorta. Degenerative changes in the shoulders. IMPRESSION: No active disease. Electronically Signed   By: Lucienne Capers M.D.   On: 08/15/2016 01:03   Scheduled Meds: . gabapentin  100 mg Oral QHS  . mometasone-formoterol  2 puff Inhalation BID   Continuous Infusions: . sodium chloride 75 mL/hr at 08/16/16 8144    Active Problems:   Essential hypertension   Chronic kidney disease (CKD), stage III (moderate)   CHB (complete heart block) (HCC)   Symptomatic anemia   GI bleed   Time spent:   Irwin Brakeman, MD, FAAFP [facility] Pager [phone]  If 7PM-7AM, please contact night-coverage [email or web address] Password [email or web address] 08/16/2016, 11:09 AM    LOS: 1 day

## 2016-08-16 NOTE — Progress Notes (Signed)
Initial Nutrition Assessment   INTERVENTION:  Boost Breeze po TID, each supplement provides 250 kcal and 9 grams of protein   ProStat 30 ml TID (each 30 ml provides 100 kcal, 15 gr protein)   When diet advanced to full liquids RD will start: Glucerna Shake po BID, each supplement provides 220 kcal and 10 grams of protein  MVI with minerals  NUTRITION DIAGNOSIS:    Inadequate oral intake related to poor appetite, ongoing.  GOAL: Pt to meet >/= 90% of their estimated nutrition needs      MONITOR: Po intake, labs and wt trends     REASON FOR ASSESSMENT: Malnutrition Screen      ASSESSMENT: Charlotte Henry is a 76 yo who presents with anemia. Hx includes CAD, HTN, GERD, DM 2 and COPD.  Patient assessed by RD on 3/2 and diagnosed with acute severe malnutrition.   Currently, pt is receiving a clear liquids. Her appetite is poor 0% intake today.   She has been seen by GI today and received 2 units PRBC's and is in process for capsule study.  NFPE: mild-moderate muscle wasting.    Labs: BUN/Creat: 36/1.02.   Medications: PPI, IVF   Recent Labs Lab 08/15/16 0048 08/15/16 1154  NA 140 137  K 4.0 4.0  CL 109 107  CO2 26 24  BUN 41* 36*  CREATININE 1.14* 1.02*  CALCIUM 8.8* 8.4*  GLUCOSE 176* 98   Diet Order:  Diet clear liquid Room service appropriate? Yes; Fluid consistency: Thin  Skin:   intact but dry and flaky  Last BM:   3/13 soft  Height:  Ht Readings from Last 1 Encounters:  08/15/16 '5\' 7"'$  (1.702 m)   Weight:  Wt Readings from Last 1 Encounters:  08/15/16 142 lb 3.2 oz (64.5 kg)   Wt Readings from Last 10 Encounters:  08/15/16 142 lb 3.2 oz (64.5 kg)  08/09/16 142 lb (64.4 kg)  08/02/16 139 lb 6.4 oz (63.2 kg)  08/02/16 137 lb 3.2 oz (62.2 kg)  07/26/16 139 lb 12.8 oz (63.4 kg)  07/11/16 149 lb 12.8 oz (67.9 kg)  07/07/16 148 lb (67.1 kg)  07/06/16 148 lb (67.1 kg)  07/04/16 146 lb (66.2 kg)  07/03/16 146 lb 3.2 oz (66.3 kg)   Ideal Body Weight:   61  kg  BMI:  Body mass index is 22.27 kg/m.  Estimated Nutritional Needs:  Kcal:   1600-1800  Protein:   78-85 gr  Fluid:   1.6-1.8 liters daily  EDUCATION NEEDS: none identified at this time    Colman Cater Charlotte,RD,CSG,LDN Office: #[phone] Pager: [phone]

## 2016-08-17 ENCOUNTER — Telehealth: Payer: Self-pay | Admitting: Family Medicine

## 2016-08-17 ENCOUNTER — Telehealth: Payer: Self-pay | Admitting: Gastroenterology

## 2016-08-17 DIAGNOSIS — K921 Melena: Secondary | ICD-10-CM

## 2016-08-17 LAB — CBC
HEMATOCRIT: 21.3 % — AB (ref 36.0–46.0)
HEMOGLOBIN: 7.1 g/dL — AB (ref 12.0–15.0)
MCH: 30.9 pg (ref 26.0–34.0)
MCHC: 33.3 g/dL (ref 30.0–36.0)
MCV: 92.6 fL (ref 78.0–100.0)
Platelets: 184 10*3/uL (ref 150–400)
RBC: 2.3 MIL/uL — ABNORMAL LOW (ref 3.87–5.11)
RDW: 18 % — ABNORMAL HIGH (ref 11.5–15.5)
WBC: 7.7 10*3/uL (ref 4.0–10.5)

## 2016-08-17 LAB — BASIC METABOLIC PANEL
ANION GAP: 3 — AB (ref 5–15)
BUN: 35 mg/dL — ABNORMAL HIGH (ref 6–20)
CALCIUM: 8.4 mg/dL — AB (ref 8.9–10.3)
CHLORIDE: 109 mmol/L (ref 101–111)
CO2: 25 mmol/L (ref 22–32)
CREATININE: 0.9 mg/dL (ref 0.44–1.00)
GFR calc Af Amer: >60 mL/min (ref >60)
GFR calc non Af Amer: >60 mL/min (ref >60)
Glucose, Bld: 109 mg/dL — ABNORMAL HIGH (ref 65–99)
Potassium: 4 mmol/L (ref 3.5–5.1)
SODIUM: 137 mmol/L (ref 135–145)

## 2016-08-17 LAB — HEMOGLOBIN AND HEMATOCRIT, BLOOD
HEMATOCRIT: 22.3 % — AB (ref 36.0–46.0)
HEMOGLOBIN: 7.4 g/dL — AB (ref 12.0–15.0)

## 2016-08-17 LAB — PREPARE RBC (CROSSMATCH)

## 2016-08-17 MED ORDER — PANTOPRAZOLE SODIUM 40 MG PO TBEC
40.0000 mg | DELAYED_RELEASE_TABLET | Freq: Two times a day (BID) | ORAL | Status: DC
  Administered 2016-08-17 – 2016-08-20 (×5): 40 mg via ORAL
  Filled 2016-08-17 (×7): qty 1

## 2016-08-17 MED ORDER — SODIUM CHLORIDE 0.9 % IV SOLN
Freq: Once | INTRAVENOUS | Status: AC
  Administered 2016-08-17: 15:00:00 via INTRAVENOUS

## 2016-08-17 MED ORDER — SODIUM CHLORIDE 0.9 % IV SOLN
INTRAVENOUS | Status: DC
  Administered 2016-08-17 (×2): via INTRAVENOUS
  Administered 2016-08-18: 1000 mL via INTRAVENOUS

## 2016-08-17 NOTE — Op Note (Signed)
Small Bowel Givens Capsule Study Procedure date:  08/16/16 (read 08/17/16)  Referring Provider:  Dr. Gala Romney PCP:  Dr. [name], MD  Indication for procedure:  Recurrent anemia on anticoagulation, recent TCS with no source identified, recent EGD no source identified  Patient data:  Wt: 139 lb 12.8 oz Ht: 67 in. Waist: N/A  Findings:  Incomplete study with capsule not making it to the cecum. Delayed transit study with delayed first gastric image and delayed gastric passage. Proximal small bowel (about 1-2 minutes past pylorus) with noted active bleeding. Copious blood in much of the small bowel the later of which is likely downstream blood, blood staining, and/or blood clot. Cannot rule out more distal small bowel bleeding, however difficult to identify with extensive blood staining. Multiple images toward the end of the study with noted blood flecks likely downstream blood clots from more proximal active bleed. Multiple images of bleeding captured. Near end of the study with dark staining likely representing melena. No obvious masses noted, although limited, again, due to significant blood.  First Gastric image:  01:15:14 First Duodenal image: 03:48:54 First Ileo-Cecal Valve image: Not obtained First Cecal image: Not Obtained Gastric Passage time: 2h 82mSmall Bowel Passage time:  Unable to calculate  Summary & Recommendations: Pertinent images reviewed with and case discussed with Dr. RGala Romney  1. Check H/H in the morning, may need blood prior to procedure (H/H this afternoon 7.4).  2. KUB ordered for the morning to check for passage of Givens Capsule due to incomplete study.  3. Scheduled enteroscopy with pediatric colonoscope to attempt to endoscopically address active bleed in proximal small bowel given proximity to pylorus.  4. Transfuse as necessary.  5. May eventually need tertiary care referral and/or other surgical options if noted continued bleeding and dependent on patient  preferences.   Thank you for allowing [facility] participate in the care of [name] DNP, AGNP-C Adult & Gerontological Nurse Practitioner [facility]  Attending note:  Pertinent images reviewed. It appears small bowel bleeding may be well within the reach of a pediatric colonoscope as described.

## 2016-08-17 NOTE — Telephone Encounter (Signed)
Patients daughter jennifer calling to discuss ms Dall being in the hospital and needing some direction [phone]

## 2016-08-17 NOTE — Telephone Encounter (Signed)
I called and spoke to [place]. She is asking results of the capsule study, she said her mom's hemoglobin continues to drop. I told her that she needs to speak with her nurse at the hospital and let her discuss their concerns with the doctors.   Forwarding as FYI to Walden Field, NP who is on hospital call this afternoon. I spoke with him and he is aware.

## 2016-08-17 NOTE — Telephone Encounter (Signed)
I called Charlotte Henry, she said the test has been read and Dr. Gala Romney is supposed to see them at 4:00 today to discuss the findings. She was happy I called her back and she is headed to the hospital to be there when the problem is discussed.

## 2016-08-17 NOTE — Telephone Encounter (Signed)
Spoke with patient daughter.   Requesting to discuss with MD.

## 2016-08-17 NOTE — Telephone Encounter (Signed)
I am not in the hospital today. The capsule will be read by one of the inpatient providers TODAY. Please let her know I only work half days on Thursday/Friday. If she still needs to speak with me after you talk with her about the capsule being ready by someone covering the hospital, then I can talk on Friday at some point.

## 2016-08-17 NOTE — Progress Notes (Signed)
Briefly saw patient this morning given drop in hemoglobin down to 7. She denies overt GI bleeding. Denies dizziness or lightheadedness. Offered her blood transfusion, she would like to postpone. She agreed to have her hemoglobin rechecked at noon and if further drop, would consider transfusion at that point. Givens capsule endoscopy pending, results to be available later today.  [name]. [name] [facility] [phone] 3/15/20189:19 AM  Addendum: follow up Hgb 7.4. Await capsule results. Repeat CBC am.   [name]. [name] [facility] [phone] 3/15/20181:38 PM

## 2016-08-17 NOTE — Telephone Encounter (Signed)
Noted. No further recommendations.  

## 2016-08-17 NOTE — Progress Notes (Signed)
PROGRESS NOTE    Charlotte Henry  WJX:[id number]  DOB: 17-Jun-1940  DOA: 08/15/2016 PCP: [name], MD Outpatient Specialists:   Admission History: Charlotte Henry  is a 76 y.o. female, With history of CAD, PVD, GERD, hypertension, CAD, COPD who was just discharged from hospital on 08/05/2016 after patient was admitted for anemia, with guaiac-positive stool. At that time patient underwent EGD and colonoscopy, which did not show significant source of bleeding. EGD showed gastritis and colonoscopy showed hemorrhoids, large diverticulum at hepatic flexure. Biopsy obtained from colonoscopy showed normal colonic mucosa.  Patient was discharged home on 08/05/2016 and hemoglobin at that time was 9.3. Repeat hemoglobin on 08/09/2016 at PCP office was 8.1.  Today patient came to ED as she has been complaining of shortness of breath on exertion, generalized weakness and fatigue. She denies chest pain. No nausea vomiting or diarrhea. No black colored stool. She denies seeing Frank blood in stool.  Assessment & Plan:   1. Symptomatic anemia- secondary to GI bleed, FOBT is positive. Transfused 2 units PRBC.  Hg now back down to 7.1. Repeat Hg at noon. Pt declined transfusion this morning.  Wants to wait for noon Hg test.  S/p capsule study 3/14, results 3/15 pending.   2. GI bleed- no clear source identified after EGD and colonoscopy during previous admission. GI following s/p capsule endoscopy which is pending.  Patient was on Xarelto which was stopped during previous admission.  3. Hypertension- blood pressure is improving. Currently not on antihypertensive medications 4. Chronic kidney stage III- creatinine is stable   DVT Prophylaxis-   SCD  AM Labs Ordered, also please review Full Orders  Family Communication: Admission, patients condition and plan of care including tests being ordered have been discussed with the patient and her daughter at bedside who indicate understanding and agree  with the plan and Code Status.  Code Status:  Full code  Subjective: Pt discouraged that Hg keeps dropping daily.     Objective: Vitals:   08/16/16 2321 08/16/16 2322 08/17/16 0559 08/17/16 0914  BP: (!) 109/51  113/69   Pulse: 69  70   Resp: 20  18   Temp: 98.2 F (36.8 C)  98.5 F (36.9 C)   TempSrc: Oral  Oral   SpO2: 100% 93% 100% 96%  Weight:      Height:        Intake/Output Summary (Last 24 hours) at 08/17/16 1017 Last data filed at 08/17/16 0900  Gross per 24 hour  Intake             1115 ml  Output                0 ml  Net             1115 ml   Filed Weights   08/15/16 0024 08/15/16 1014  Weight: 64.4 kg (142 lb) 64.5 kg (142 lb 3.2 oz)    Exam:  General exam: awake, alert, NAD. Cooperative.  Respiratory system: Clear. No increased work of breathing. Cardiovascular system: S1 & S2 heard.  No JVD, murmurs, gallops, clicks or pedal edema. Gastrointestinal system: Abdomen is nondistended, soft and nontender. Normal bowel sounds heard. Central nervous system: Alert and oriented. No focal neurological deficits. Extremities: no CCE.  Data Reviewed: Basic Metabolic Panel:  Recent Labs Lab 08/15/16 0048 08/15/16 1154 08/17/16 0508  NA 140 137 137  K 4.0 4.0 4.0  CL 109 107 109  CO2 '26 24 25  '$ GLUCOSE 176*  98 109*  BUN 41* 36* 35*  CREATININE 1.14* 1.02* 0.90  CALCIUM 8.8* 8.4* 8.4*   Liver Function Tests:  Recent Labs Lab 08/15/16 1154  AST 27  ALT 17  ALKPHOS 31*  BILITOT 0.8  PROT 5.7*  ALBUMIN 3.2*   No results for input(s): LIPASE, AMYLASE in the last 168 hours. No results for input(s): AMMONIA in the last 168 hours. CBC:  Recent Labs Lab 08/15/16 0048 08/15/16 1154 08/16/16 0927 08/17/16 0508  WBC 9.6 9.9 8.2 7.7  NEUTROABS 6.5  --   --   --   HGB 5.4* 9.0* 8.0* 7.1*  HCT 16.1* 26.9* 23.8* 21.3*  MCV 95.8 90.6 91.2 92.6  PLT 230 205 191 184   Cardiac Enzymes:  Recent Labs Lab 08/15/16 0048  TROPONINI <0.03   CBG  (last 3)  No results for input(s): GLUCAP in the last 72 hours. No results found for this or any previous visit (from the past 240 hour(s)).   Studies: No results found. Scheduled Meds: . feeding supplement  1 Container Oral TID BM  . feeding supplement (PRO-STAT SUGAR FREE 64)  30 mL Oral TID  . gabapentin  100 mg Oral QHS  . mometasone-formoterol  2 puff Inhalation BID  . multivitamin with minerals  1 tablet Oral Daily   Continuous Infusions:   Active Problems:   Essential hypertension   Chronic kidney disease (CKD), stage III (moderate)   CHB (complete heart block) (HCC)   Symptomatic anemia   Gastrointestinal hemorrhage with melena   Time spent:   Irwin Brakeman, MD, FAAFP [facility] Pager [phone]  If 7PM-7AM, please contact night-coverage [email or web address] Password [email or web address] 08/17/2016, 10:17 AM    LOS: 2 days

## 2016-08-17 NOTE — Telephone Encounter (Signed)
Pt's daughter, Cheron Every, called asking to speak with AB. I told her that AB was with patients and I could take a message. She said that Mrs. Suttles was in the hospital and had a capsule study done yesterday and wanted to know the results or the status of that. I told her the results are normally 7-10 business days and the nurse would contact the patient. The daughter is wanting to speak with AB. Please call daughter at [phone]

## 2016-08-18 ENCOUNTER — Inpatient Hospital Stay (HOSPITAL_COMMUNITY): Payer: Medicare HMO

## 2016-08-18 ENCOUNTER — Encounter (HOSPITAL_COMMUNITY): Payer: Self-pay

## 2016-08-18 ENCOUNTER — Encounter (HOSPITAL_COMMUNITY)
Admission: EM | Disposition: A | Discharge status: Home / Self Care | Payer: Self-pay | Source: Home / Self Care | Attending: Family Medicine

## 2016-08-18 DIAGNOSIS — K6389 Other specified diseases of intestine: Secondary | ICD-10-CM

## 2016-08-18 HISTORY — PX: ENTEROSCOPY: SHX5533

## 2016-08-18 LAB — CBC WITH DIFFERENTIAL/PLATELET
BASOS ABS: 0 10*3/uL (ref 0.0–0.1)
Basophils Relative: 0 %
Eosinophils Absolute: 0.2 10*3/uL (ref 0.0–0.7)
Eosinophils Relative: 2 %
HEMATOCRIT: 18.5 % — AB (ref 36.0–46.0)
Hemoglobin: 6.1 g/dL — CL (ref 12.0–15.0)
LYMPHS PCT: 27 %
Lymphs Abs: 2 10*3/uL (ref 0.7–4.0)
MCH: 30.8 pg (ref 26.0–34.0)
MCHC: 33 g/dL (ref 30.0–36.0)
MCV: 93.4 fL (ref 78.0–100.0)
Monocytes Absolute: 0.6 10*3/uL (ref 0.1–1.0)
Monocytes Relative: 8 %
NEUTROS ABS: 4.7 10*3/uL (ref 1.7–7.7)
Neutrophils Relative %: 63 %
Platelets: 175 10*3/uL (ref 150–400)
RBC: 1.98 MIL/uL — AB (ref 3.87–5.11)
RDW: 18.6 % — ABNORMAL HIGH (ref 11.5–15.5)
WBC: 7.4 10*3/uL (ref 4.0–10.5)

## 2016-08-18 LAB — HEMOGLOBIN AND HEMATOCRIT, BLOOD
HCT: 27.7 % — ABNORMAL LOW (ref 36.0–46.0)
Hemoglobin: 9.3 g/dL — ABNORMAL LOW (ref 12.0–15.0)

## 2016-08-18 LAB — PREPARE RBC (CROSSMATCH)

## 2016-08-18 SURGERY — ENTEROSCOPY
Anesthesia: Moderate Sedation

## 2016-08-18 MED ORDER — SODIUM CHLORIDE 0.9 % IV SOLN
Freq: Once | INTRAVENOUS | Status: AC
  Administered 2016-08-18: 09:00:00 via INTRAVENOUS

## 2016-08-18 MED ORDER — ONDANSETRON HCL 4 MG/2ML IJ SOLN
INTRAMUSCULAR | Status: AC
  Filled 2016-08-18: qty 2

## 2016-08-18 MED ORDER — MEPERIDINE HCL 100 MG/ML IJ SOLN
INTRAMUSCULAR | Status: AC
  Filled 2016-08-18: qty 2

## 2016-08-18 MED ORDER — GLUCAGON HCL RDNA (DIAGNOSTIC) 1 MG IJ SOLR
INTRAMUSCULAR | Status: AC
  Filled 2016-08-18: qty 1

## 2016-08-18 MED ORDER — SODIUM CHLORIDE 0.9 % IJ SOLN
PREFILLED_SYRINGE | INTRAMUSCULAR | Status: DC | PRN
  Administered 2016-08-18: 3 mL

## 2016-08-18 MED ORDER — GLUCAGON HCL (RDNA) 1 MG IJ SOLR
INTRAMUSCULAR | Status: DC | PRN
  Administered 2016-08-18: .5 mg via INTRAVENOUS

## 2016-08-18 MED ORDER — MIDAZOLAM HCL 5 MG/5ML IJ SOLN
INTRAMUSCULAR | Status: DC | PRN
  Administered 2016-08-18 (×2): 1 mg via INTRAVENOUS
  Administered 2016-08-18: 2 mg via INTRAVENOUS

## 2016-08-18 MED ORDER — SPOT INK MARKER SYRINGE KIT
PACK | SUBMUCOSAL | Status: AC
  Filled 2016-08-18: qty 5

## 2016-08-18 MED ORDER — EPINEPHRINE PF 1 MG/10ML IJ SOSY
PREFILLED_SYRINGE | INTRAMUSCULAR | Status: AC
  Filled 2016-08-18: qty 10

## 2016-08-18 MED ORDER — LIDOCAINE VISCOUS 2 % MT SOLN
OROMUCOSAL | Status: AC
  Filled 2016-08-18: qty 15

## 2016-08-18 MED ORDER — SPOT INK MARKER SYRINGE KIT
PACK | SUBMUCOSAL | Status: DC | PRN
  Administered 2016-08-18: 2 mL via SUBMUCOSAL

## 2016-08-18 MED ORDER — MEPERIDINE HCL 50 MG/ML IJ SOLN
INTRAMUSCULAR | Status: DC | PRN
  Administered 2016-08-18: 25 mg via INTRAVENOUS

## 2016-08-18 MED ORDER — MIDAZOLAM HCL 5 MG/5ML IJ SOLN
INTRAMUSCULAR | Status: AC
  Filled 2016-08-18: qty 10

## 2016-08-18 NOTE — Progress Notes (Signed)
Pt's Hgb 7.4 on 3/15, 6.1 3/16 a.m., MD made aware, orders received to transfuse 2 units PRBC's.

## 2016-08-18 NOTE — Progress Notes (Signed)
PROGRESS NOTE   Charlotte Henry  YTK:[id number]  DOB: 1941-02-29  DOA: 08/15/2016 PCP: [name], MD Outpatient Specialists:   Admission History: Charlotte Henry  is a 76 y.o. female, With history of CAD, PVD, GERD, hypertension, CAD, COPD who was just discharged from hospital on 08/05/2016 after patient was admitted for anemia, with guaiac-positive stool. At that time patient underwent EGD and colonoscopy, which did not show significant source of bleeding. EGD showed gastritis and colonoscopy showed hemorrhoids, large diverticulum at hepatic flexure. Biopsy obtained from colonoscopy showed normal colonic mucosa.  Patient was discharged home on 08/05/2016 and hemoglobin at that time was 9.3. Repeat hemoglobin on 08/09/2016 at PCP office was 8.1.  Today patient came to ED as she has been complaining of shortness of breath on exertion, generalized weakness and fatigue. She denies chest pain. No nausea vomiting or diarrhea. No black colored stool. She denies seeing Frank blood in stool.  Assessment & Plan:   1. Symptomatic anemia- secondary to GI bleed, FOBT is positive. Transfused PRBCs daily and trending down Hb.  Hg now back down to 6.1.   S/p capsule study 3/14, results 3/15 reveal source that may be amenable to treatment with pediatric colonoscope.Marland Kitchen Pending procedure today.   2. GI bleed- Pt to go to have EGD with pediatric colonoscope 3/16.    Patient was on Xarelto which was stopped during previous admission.  3. Hypertension- blood pressure is improving. Currently not on antihypertensive medications 4. Chronic kidney stage III- creatinine is stable   DVT Prophylaxis-   SCD  AM Labs Ordered, also please review Full Orders  Family Communication: Admission, patients condition and plan of care including tests being ordered have been discussed with the patient and her daughter at bedside who indicate understanding and agree with the plan and Code Status.  Code Status:  Full  code  Subjective: Pt without complaints today.      Objective: Vitals:   08/17/16 2044 08/17/16 2212 08/18/16 0535 08/18/16 1111  BP:  (!) 108/59 110/62 112/70  Pulse:  70 82 84  Resp:  '18 18 18  '$ Temp:  98.5 F (36.9 C) 98.1 F (36.7 C) 98.4 F (36.9 C)  TempSrc:  Oral Oral Oral  SpO2: 99% 100% 100% 100%  Weight:      Height:        Intake/Output Summary (Last 24 hours) at 08/18/16 1236 Last data filed at 08/18/16 1111  Gross per 24 hour  Intake              695 ml  Output                0 ml  Net              695 ml   Filed Weights   08/15/16 0024 08/15/16 1014  Weight: 64.4 kg (142 lb) 64.5 kg (142 lb 3.2 oz)    Exam:  General exam: awake, alert, NAD. Cooperative.  Respiratory system: Clear. No increased work of breathing. Cardiovascular system: S1 & S2 heard.  No JVD, murmurs, gallops, clicks or pedal edema. Gastrointestinal system: Abdomen is nondistended, soft and nontender. Normal bowel sounds heard. Central nervous system: Alert and oriented. No focal neurological deficits. Extremities: no CCE.  Data Reviewed: Basic Metabolic Panel:  Recent Labs Lab 08/15/16 0048 08/15/16 1154 08/17/16 0508  NA 140 137 137  K 4.0 4.0 4.0  CL 109 107 109  CO2 '26 24 25  '$ GLUCOSE 176* 98 109*  BUN 41*  36* 35*  CREATININE 1.14* 1.02* 0.90  CALCIUM 8.8* 8.4* 8.4*   Liver Function Tests:  Recent Labs Lab 08/15/16 1154  AST 27  ALT 17  ALKPHOS 31*  BILITOT 0.8  PROT 5.7*  ALBUMIN 3.2*   No results for input(s): LIPASE, AMYLASE in the last 168 hours. No results for input(s): AMMONIA in the last 168 hours. CBC:  Recent Labs Lab 08/15/16 0048 08/15/16 1154 08/16/16 0927 08/17/16 0508 08/17/16 1249 08/18/16 0546  WBC 9.6 9.9 8.2 7.7  --  7.4  NEUTROABS 6.5  --   --   --   --  4.7  HGB 5.4* 9.0* 8.0* 7.1* 7.4* 6.1*  HCT 16.1* 26.9* 23.8* 21.3* 22.3* 18.5*  MCV 95.8 90.6 91.2 92.6  --  93.4  PLT 230 205 191 184  --  175   Cardiac Enzymes:  Recent  Labs Lab 08/15/16 0048  TROPONINI <0.03   CBG (last 3)  No results for input(s): GLUCAP in the last 72 hours. No results found for this or any previous visit (from the past 240 hour(s)).   Studies: Dg Abd 1 View  Result Date: 08/18/2016 CLINICAL DATA:  Foreign body ingestion. EXAM: ABDOMEN - 1 VIEW COMPARISON:  06/28/2016 . FINDINGS: Surgical clips right upper quadrant. Radiopacity is noted over the right mid abdomen consistent patient's history of foreign body ingestion. Stool noted throughout the colon. No bowel distention. IMPRESSION: Radiopacity is noted over the right mid abdomen consistent patient's foreign body ingestion. Electronically Signed   By: Marcello Moores  Register   On: 08/18/2016 10:06   Scheduled Meds: . feeding supplement  1 Container Oral TID BM  . feeding supplement (PRO-STAT SUGAR FREE 64)  30 mL Oral TID  . gabapentin  100 mg Oral QHS  . mometasone-formoterol  2 puff Inhalation BID  . multivitamin with minerals  1 tablet Oral Daily  . pantoprazole  40 mg Oral BID   Continuous Infusions: . sodium chloride 20 mL/hr at 08/17/16 1939    Active Problems:   Essential hypertension   Chronic kidney disease (CKD), stage III (moderate)   CHB (complete heart block) (HCC)   Symptomatic anemia   Gastrointestinal hemorrhage with melena   Time spent:   Irwin Brakeman, MD, FAAFP [facility] Pager [phone]  If 7PM-7AM, please contact night-coverage [email or web address] Password [email or web address] 08/18/2016, 12:36 PM    LOS: 3 days

## 2016-08-18 NOTE — Telephone Encounter (Signed)
Spoke with pt daughter, about current admission. She will have enteroscopy, if unable to stop the bleeding, it is noted she can be transferred to tertiary care center

## 2016-08-18 NOTE — Op Note (Addendum)
[facility] Patient Name: Charlotte Henry Procedure Date: 08/18/2016 4:07 PM MRN: [id number] Date of Birth: [birth year] Attending MD: Norvel Richards , MD CSN: [id number] Age: 76 Admit Type: Inpatient Procedure:                Small bowel enteroscopy with bleeding control                            therapy Indications:              GI bleeding documented on video capsule endoscopy Providers:                Norvel Richards, MD, Lurline Del, RN, Bonnetta Barry, Technician Referring MD:             Modena Nunnery. [place] Medicines:                Midazolam 4 mg IV, Meperidine 25 mg IV, Ondansetron                            4 mg IV Complications:            No immediate complications. Estimated Blood Loss:     Estimated blood loss was minimal. Procedure:                Pre-Anesthesia Assessment:                           - Prior to the procedure, a History and Physical                            was performed, and patient medications and                            allergies were reviewed. The patient's tolerance of                            previous anesthesia was also reviewed. The risks                            and benefits of the procedure and the sedation                            options and risks were discussed with the patient.                            All questions were answered, and informed consent                            was obtained. Prior Anticoagulants: The patient                            last took Eliquis (apixaban) 5 days prior to the  procedure. ASA Grade Assessment: III - A patient                            with severe systemic disease. After reviewing the                            risks and benefits, the patient was deemed in                            satisfactory condition to undergo the procedure.                           After obtaining informed consent, the endoscope was       passed under direct vision. Throughout the                            procedure, the patient's blood pressure, pulse, and                            oxygen saturations were monitored continuously. The                            EC-2990Li was introduced through the mouth and                            advanced to the proximal jejunum. The EC-2990Li was                            introduced through the mouth and advanced to the                            proximal jejunum. The small bowel enteroscopy was                            accomplished without difficulty. The patient                            tolerated the procedure well. Scope In: 4:10:32 PM Scope Out: 4:41:54 PM Total Procedure Duration: 0 hours 31 minutes 22 seconds  Findings:      The esophagus was normal.      The stomach was normal.      Pediatric coscope scope advanced to the distal duodenum/junction       ofjejunum. Brisk bleeding area identified in this area. Please see       photographs.      Frothy red blood coming out of what appeared to be normal mucosa       suggestive of a Dieulafoy lesion. Lesion clipped X 4 (2 - 360s and 2       instinct clips). 3 mL of 1-10,000 injected submucosally around bleeding       lesion. Bleeding totally ceased with the these maneuvers. The bleeding       area was marked immediately proximally and distally with Ink. Impression:               - Normal esophagus.                           -  Normal stomach.                           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                           - Full liquid diet today. Repeat H&H at 2000 hrs.                            today. Address H pylori infection in the near                            future. Dr. Laural Golden will be seeing as needed over                            the weekend. Moderate Sedation:      Moderate (conscious) sedation was administered by the  endoscopy nurse       and supervised by the endoscopist. The following parameters were       monitored: oxygen saturation, heart rate, blood pressure, respiratory       rate, EKG, adequacy of pulmonary ventilation, and response to care.       Total physician intraservice time was 38 minutes. Recommendation:            Procedure Code(s):        --- Professional ---                           [id number], Small intestinal endoscopy, enteroscopy                            beyond second portion of duodenum, not including                            ileum; diagnostic, including collection of                            specimen(s) by brushing or washing, when performed                            (separate procedure)                           99152, Moderate sedation services provided by the                            same physician or other qualified health care                            professional performing the diagnostic or                            therapeutic service that the sedation supports,  requiring the presence of an independent trained                            observer to assist in the monitoring of the                            patient's level of consciousness and physiological                            status; initial 15 minutes of intraservice time,                            patient age 32 years or older                           [phone], Moderate sedation services; each additional                            15 minutes intraservice time                           99153, Moderate sedation services; each additional                            15 minutes intraservice time Diagnosis Code(s):        --- Professional ---                           K63.89, Other specified diseases of intestine                           K92.2, Gastrointestinal hemorrhage, unspecified CPT copyright 2016 [employer]. All rights reserved. The codes documented in this  report are preliminary and upon coder review may  be revised to meet current compliance requirements. Cristopher Estimable. Rourk, MD Norvel Richards, MD 08/18/2016 5:13:24 PM This report has been signed electronically. Number of Addenda: 0

## 2016-08-18 NOTE — Care Management Important Message (Signed)
Important Message  Patient Details  Name: Charlotte Henry MRN: [id number] Date of Birth: 09-21-1940   Medicare Important Message Given:  Yes    Davie Claud, Chauncey Reading, RN 08/18/2016, 2:13 PM

## 2016-08-19 DIAGNOSIS — K922 Gastrointestinal hemorrhage, unspecified: Secondary | ICD-10-CM

## 2016-08-19 DIAGNOSIS — K6389 Other specified diseases of intestine: Secondary | ICD-10-CM

## 2016-08-19 LAB — TYPE AND SCREEN
ABO/RH(D): O POS
Antibody Screen: NEGATIVE
UNIT DIVISION: 0
Unit division: 0
Unit division: 0
Unit division: 0

## 2016-08-19 LAB — BPAM RBC
BLOOD PRODUCT EXPIRATION DATE: 201803262359
BLOOD PRODUCT EXPIRATION DATE: 201804022359
Blood Product Expiration Date: 201804022359
Blood Product Expiration Date: 201804062359
ISSUE DATE / TIME: 201803161752
ISSUE DATE / TIME: 201803130330
ISSUE DATE / TIME: 201803130706
ISSUE DATE / TIME: 201803161332
UNIT TYPE AND RH: 5100
UNIT TYPE AND RH: 5100
Unit Type and Rh: 5100
Unit Type and Rh: 5100

## 2016-08-19 LAB — CBC
HEMATOCRIT: 26.7 % — AB (ref 36.0–46.0)
Hemoglobin: 8.9 g/dL — ABNORMAL LOW (ref 12.0–15.0)
MCH: 30.1 pg (ref 26.0–34.0)
MCHC: 33.3 g/dL (ref 30.0–36.0)
MCV: 90.2 fL (ref 78.0–100.0)
Platelets: 172 10*3/uL (ref 150–400)
RBC: 2.96 MIL/uL — AB (ref 3.87–5.11)
RDW: 18.1 % — AB (ref 11.5–15.5)
WBC: 11.2 10*3/uL — AB (ref 4.0–10.5)

## 2016-08-19 MED ORDER — LUBIPROSTONE 8 MCG PO CAPS
8.0000 ug | ORAL_CAPSULE | Freq: Two times a day (BID) | ORAL | Status: DC
  Administered 2016-08-19 – 2016-08-20 (×2): 8 ug via ORAL
  Filled 2016-08-19 (×4): qty 1

## 2016-08-19 NOTE — Progress Notes (Signed)
PROGRESS NOTE   Charlotte Henry  JYN:[id number]  DOB: 10/11/1940  DOA: 08/15/2016 PCP: [name], MD Outpatient Specialists:   Admission History: Charlotte Henry  is a 76 y.o. female, With history of CAD, PVD, GERD, hypertension, CAD, COPD who was just discharged from hospital on 08/05/2016 after patient was admitted for anemia, with guaiac-positive stool. At that time patient underwent EGD and colonoscopy, which did not show significant source of bleeding. EGD showed gastritis and colonoscopy showed hemorrhoids, large diverticulum at hepatic flexure. Biopsy obtained from colonoscopy showed normal colonic mucosa.  Patient was discharged home on 08/05/2016 and hemoglobin at that time was 9.3. Repeat hemoglobin on 08/09/2016 at PCP office was 8.1.  Today patient came to ED as she has been complaining of shortness of breath on exertion, generalized weakness and fatigue. She denies chest pain. No nausea vomiting or diarrhea. No black colored stool. She denies seeing Frank blood in stool.  Assessment & Plan:   1. Symptomatic anemia- secondary to GI bleed, FOBT was positive. Transfused PRBCs.  Hg now at 8.9.   S/p capsule study 3/14, results 3/15 reveal source that may be amenable to treatment with pediatric colonoscope.Pt was taken for EGD 3/16 and bleeding source clipped and stopped.   2. GI bleed- Pt had EGD with pediatric colonoscope 3/16 and bleeding source clipped x4.    Patient was on Xarelto which was stopped during previous admission.  3. Hypertension- blood pressure is improving. Currently not on antihypertensive medications 4. Chronic kidney stage III- creatinine is stable   DVT Prophylaxis-   SCD  AM Labs Ordered, also please review Full Orders  Family Communication: Admission, patients condition and plan of care including tests being ordered have been discussed with the patient and her daughter at bedside who indicate understanding and agree with the plan and Code  Status.  Code Status:  Full code  Subjective: Pt says she is feeling better.     Objective: Vitals:   08/18/16 2027 08/18/16 2030 08/18/16 2158 08/19/16 0600  BP:  (!) 103/47 105/62 (!) 104/53  Pulse:   62 62  Resp:   18 18  Temp:   97.9 F (36.6 C) 98.2 F (36.8 C)  TempSrc:   Oral Oral  SpO2: 99%  98% 100%  Weight:      Height:        Intake/Output Summary (Last 24 hours) at 08/19/16 0948 Last data filed at 08/18/16 2030  Gross per 24 hour  Intake             3662 ml  Output                0 ml  Net             3662 ml   Filed Weights   08/15/16 0024 08/15/16 1014  Weight: 64.4 kg (142 lb) 64.5 kg (142 lb 3.2 oz)    Exam:  General exam: awake, alert, NAD. Cooperative.  Respiratory system: Clear. No increased work of breathing. Cardiovascular system: S1 & S2 heard.  No JVD, murmurs, gallops, clicks or pedal edema. Gastrointestinal system: Abdomen is nondistended, soft and nontender. Normal bowel sounds heard. Central nervous system: Alert and oriented. No focal neurological deficits. Extremities: no CCE.  Data Reviewed: Basic Metabolic Panel:  Recent Labs Lab 08/15/16 0048 08/15/16 1154 08/17/16 0508  NA 140 137 137  K 4.0 4.0 4.0  CL 109 107 109  CO2 '26 24 25  '$ GLUCOSE 176* 98 109*  BUN 41*  36* 35*  CREATININE 1.14* 1.02* 0.90  CALCIUM 8.8* 8.4* 8.4*   Liver Function Tests:  Recent Labs Lab 08/15/16 1154  AST 27  ALT 17  ALKPHOS 31*  BILITOT 0.8  PROT 5.7*  ALBUMIN 3.2*   No results for input(s): LIPASE, AMYLASE in the last 168 hours. No results for input(s): AMMONIA in the last 168 hours. CBC:  Recent Labs Lab 08/15/16 0048 08/15/16 1154 08/16/16 0927 08/17/16 0508 08/17/16 1249 08/18/16 0546 08/18/16 2158 08/19/16 0616  WBC 9.6 9.9 8.2 7.7  --  7.4  --  11.2*  NEUTROABS 6.5  --   --   --   --  4.7  --   --   HGB 5.4* 9.0* 8.0* 7.1* 7.4* 6.1* 9.3* 8.9*  HCT 16.1* 26.9* 23.8* 21.3* 22.3* 18.5* 27.7* 26.7*  MCV 95.8 90.6 91.2  92.6  --  93.4  --  90.2  PLT 230 205 191 184  --  175  --  172   Cardiac Enzymes:  Recent Labs Lab 08/15/16 0048  TROPONINI <0.03   CBG (last 3)  No results for input(s): GLUCAP in the last 72 hours. No results found for this or any previous visit (from the past 240 hour(s)).   Studies: Dg Abd 1 View  Result Date: 08/18/2016 CLINICAL DATA:  Foreign body ingestion. EXAM: ABDOMEN - 1 VIEW COMPARISON:  06/28/2016 . FINDINGS: Surgical clips right upper quadrant. Radiopacity is noted over the right mid abdomen consistent patient's history of foreign body ingestion. Stool noted throughout the colon. No bowel distention. IMPRESSION: Radiopacity is noted over the right mid abdomen consistent patient's foreign body ingestion. Electronically Signed   By: Marcello Moores  Register   On: 08/18/2016 10:06   Scheduled Meds: . feeding supplement  1 Container Oral TID BM  . feeding supplement (PRO-STAT SUGAR FREE 64)  30 mL Oral TID  . gabapentin  100 mg Oral QHS  . mometasone-formoterol  2 puff Inhalation BID  . multivitamin with minerals  1 tablet Oral Daily  . pantoprazole  40 mg Oral BID   Continuous Infusions:  Active Problems:   Essential hypertension   Chronic kidney disease (CKD), stage III (moderate)   CHB (complete heart block) (HCC)   Symptomatic anemia   Gastrointestinal hemorrhage with melena  Time spent:   Irwin Brakeman, MD, FAAFP [facility] Pager [phone]  If 7PM-7AM, please contact night-coverage [email or web address] Password [email or web address] 08/19/2016, 9:48 AM    LOS: 4 days

## 2016-08-19 NOTE — Progress Notes (Signed)
  Subjective:  Patient feels fine. She denies nausea vomiting or abdominal pain. She has not had a bowel movement since EGD yesterday. Patient states she has not taken Xeralto since 08/02/2016.  Objective: Blood pressure (!) 104/53, pulse 62, temperature 98.2 F (36.8 C), temperature source Oral, resp. rate 18, height '5\' 7"'$  (1.702 m), weight 142 lb 3.2 oz (64.5 kg), SpO2 94 %. Patient is alert and in no acute distress. Abdomen symmetrical. Bowel sounds are normal. On palpation is soft and nontender without organomegaly or masses. No LE edema or clubbing noted.  Labs/studies Results:   Recent Labs  08/17/16 0508  08/18/16 0546 08/18/16 2158 08/19/16 0616  WBC 7.7  --  7.4  --  11.2*  HGB 7.1*  < > 6.1* 9.3* 8.9*  HCT 21.3*  < > 18.5* 27.7* 26.7*  PLT 184  --  175  --  172  < > = values in this interval not displayed.  BMET   Recent Labs  08/17/16 0508  NA 137  K 4.0  CL 109  CO2 25  GLUCOSE 109*  BUN 35*  CREATININE 0.90  CALCIUM 8.4*      Assessment:  #1.Recurrent GI bleed finally localized to proximal jejunum while given capsule study. Patient underwent therapeutic push enteroscopy by Dr. Gala Romney yesterday and noted to be actively bleeding from Dieulafoy lesion. It was treated with epinephrine and hemoclips with hemostasis. No evidence of active bleeding.  #2. Anemia secondary to small bowel GI bleed as above. Patient has received 4 units of PRBCs during this admission.  #3. Chronic constipation. Will resume Amitiza.  #4. Anticoagulation on hold since last admission(08/02/2016).  Recommendations:  CBC and metabolic 7 in a.m. Amitiza 8 g by mouth twice a day. If patient needs to go back on anticoagulatant, she can resume in five days.

## 2016-08-20 LAB — BASIC METABOLIC PANEL
ANION GAP: 4 — AB (ref 5–15)
BUN: 31 mg/dL — ABNORMAL HIGH (ref 6–20)
CALCIUM: 8.4 mg/dL — AB (ref 8.9–10.3)
CO2: 23 mmol/L (ref 22–32)
Chloride: 109 mmol/L (ref 101–111)
Creatinine, Ser: 1.08 mg/dL — ABNORMAL HIGH (ref 0.44–1.00)
GFR, EST AFRICAN AMERICAN: 56 mL/min — AB (ref >60)
GFR, EST NON AFRICAN AMERICAN: 48 mL/min — AB (ref >60)
Glucose, Bld: 91 mg/dL (ref 65–99)
POTASSIUM: 3.9 mmol/L (ref 3.5–5.1)
Sodium: 136 mmol/L (ref 135–145)

## 2016-08-20 LAB — CBC
HCT: 24.5 % — ABNORMAL LOW (ref 36.0–46.0)
HEMOGLOBIN: 8 g/dL — AB (ref 12.0–15.0)
MCH: 30 pg (ref 26.0–34.0)
MCHC: 32.7 g/dL (ref 30.0–36.0)
MCV: 91.8 fL (ref 78.0–100.0)
Platelets: 181 10*3/uL (ref 150–400)
RBC: 2.67 MIL/uL — ABNORMAL LOW (ref 3.87–5.11)
RDW: 18.4 % — ABNORMAL HIGH (ref 11.5–15.5)
WBC: 7.1 10*3/uL (ref 4.0–10.5)

## 2016-08-20 LAB — HEMOGLOBIN AND HEMATOCRIT, BLOOD
HEMATOCRIT: 29.2 % — AB (ref 36.0–46.0)
HEMOGLOBIN: 9.7 g/dL — AB (ref 12.0–15.0)

## 2016-08-20 MED ORDER — PRO-STAT SUGAR FREE PO LIQD: 30.0000 mL | Freq: Three times a day (TID) | ORAL | 0 refills | Status: DC

## 2016-08-20 MED ORDER — ESOMEPRAZOLE MAGNESIUM 40 MG PO CPDR: 40.0000 mg | DELAYED_RELEASE_CAPSULE | Freq: Two times a day (BID) | ORAL | 0 refills | Status: DC

## 2016-08-20 NOTE — Discharge Summary (Signed)
Physician Discharge Summary  Charlotte Henry QTM:[id number] DOB: 25-Jul-1940 DOA: 08/15/2016  PCP: [name], MD  Admit date: 08/15/2016 Discharge date: 08/20/2016  Admitted From: Home  Disposition:  Home   Recommendations for Outpatient Follow-up:  1. Follow up with PCP in 1 weeks.  Follow up with GI in 2 weeks. Follow up with cardiology in 2 weeks.  2. Please obtain CBC in one week 3. Please hold all blood thinners for at least 1 week.  4. Please start treatment for H Pylori Infection as soon as possible   Pt was given additional instructions to:  Return if symptoms recur, worsen or new problems develop. Continue a soft diet for 2 more weeks. Follow-up with your primary care physician in the next 4-5 days to have a CBC rechecked. Please avoid all blood thinners for at least one week. Do not re-start any blood thinners without checking with your primary care provider.  Discharge Condition: STable  CODE STATUS: FULL  Diet recommendation: soft    Brief/Interim Summary: Charlotte Henry  is a 76 y.o. female, With history of CAD, PVD, GERD, hypertension, CAD, COPD who was just discharged from hospital on 08/05/2016 after patient was admitted for anemia, with guaiac-positive stool. At that time patient underwent EGD and colonoscopy, which did not show significant source of bleeding. EGD showed gastritis and colonoscopy showed hemorrhoids, large diverticulum at hepatic flexure. Biopsy obtained from colonoscopy showed normal colonic mucosa.  Patient was discharged home on 08/05/2016 and hemoglobin at that time was 9.3. Repeat hemoglobin on 08/09/2016 at PCP office was 8.1.  Today patient came to ED as she has been complaining of shortness of breath on exertion, generalized weakness and fatigue. She denies chest pain. No nausea vomiting or diarrhea. No black colored stool. She denies seeing Frank blood in stool. Lab work in the ED showed hemoglobin of 5.4. FOBT was positive Patient was supposed  to follow-up with GI on Tuesday 08/15/2016.  Assessment & Plan:   1. Symptomatic anemia- secondary to GI bleed, FOBT was positive.Transfused PRBCs. Hg now at 8.9.   S/p capsule study 3/14, results 3/15 reveal source that may be amenable to treatment with pediatric colonoscope.Pt was taken for EGD 3/16 and bleeding source clipped and stopped.   2. H Pylori infection - Pt did not tolerate initial attempt at treating.  Pt to follow up with PCP and GI for further treatment plan and options.   3. GI bleed- Pt had EGD with pediatric colonoscope 3/16 and bleeding source clipped x4.   Patient was on Xarelto which was stopped during previous admission.Pt will need to hold blood thinners for at least 1 week per GI team.  Will defer restarting any anticoagulation to outpatient providers.   4. Hypertension- stable and controlled on no meds. Follow up with PCP for recheck.  5. Chronickidney stage III- creatinine is stable   EGD done 08/18/16 Findings:      The esophagus was normal.      The stomach was normal.      Pediatric coscope scope advanced to the distal duodenum/junction       ofjejunum. Brisk bleeding area identified in this area. Please see photographs.      Frothy red blood coming out of what appeared to be normal mucosa       suggestive of a Dieulafoy lesion. Lesion clipped X 4 (2 - 360s and 2       instinct clips). 3 mL of 1-10,000 injected submucosally around bleeding  lesion. Bleeding totally ceased with the these maneuvers. The bleeding       area was marked immediately proximally and distally with Ink. Impression:       - Normal esophagus.                           - Normal stomach.                           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                           - Full liquid diet today. Repeat H&H at 2000 hrs.  today. Address H pylori infection in the near future. Dr. Laural Golden will be seeing as needed over                             the weekend. Moderate Sedation:      Moderate (conscious) sedation was administered by the endoscopy nurse       and supervised by the endoscopist. The following parameters were       monitored: oxygen saturation, heart rate, blood pressure, respiratory       rate, EKG, adequacy of pulmonary ventilation, and response to care.       Total physician intraservice time was 38 minutes.   DVT Prophylaxis- SCD  AM Labs Ordered, also please review Full Orders  Family Communication:Admission, patients condition and plan of care including tests being ordered have been discussed with the patient and her daughter at [facility] indicate understanding and agree with the plan and Code Status.  Code Status:Full code  Discharge Diagnoses:  Active Problems:   Essential hypertension   Chronic kidney disease (CKD), stage III (moderate)   CHB (complete heart block) (HCC)   Symptomatic anemia   Gastrointestinal hemorrhage with melena  Discharge Instructions  Discharge Instructions    Increase activity slowly    Complete by:  As directed      Allergies as of 08/20/2016      Reactions   Ace Inhibitors Hives, Shortness Of Breath, Swelling   Aspirin Other (See Comments)   Blood in stool and nose bleed   Codeine Swelling   Pineapple Shortness Of Breath, Swelling   Plasticized Base [plastibase] Hives, Swelling   No plastics   Ultram [tramadol] Swelling   Adhesive [tape] Swelling, Rash   Naprosyn [naproxen] Swelling   Biaxin [clarithromycin] Nausea And Vomiting, Other (See Comments)   Dizziness.   Clindamycin/lincomycin Other (See Comments)   Burning sensation throat, abdomen   Linzess [linaclotide]    DIARRHEA, FECAL INCONTINENCE   Metronidazole    NAUSEA AND WEAKNESS   Nsaids    Penicillins    Lip swelling, Hives Has patient had a PCN reaction causing immediate rash, facial/tongue/throat swelling, SOB or lightheadedness with hypotension:YES Has patient had a PCN  reaction causing severe rash involving mucus membranes or skin necrosis: NO Has patient had a PCN reaction that required hospitalization NO Has patient had a PCN reaction occurring within the last 10 years: NO If all of the above answers are "NO", then may proceed with Cephalosporin use.   Varenicline Tartrate Swelling      Medication List    TAKE these medications   acetaminophen 500 MG tablet Commonly known as:  TYLENOL Take 500 mg by mouth every 6 (six) hours as needed for mild pain or moderate pain.   albuterol 108 (90 Base) MCG/ACT inhaler Commonly known as:  PROVENTIL HFA;VENTOLIN HFA Inhale 2 puffs into the lungs every 4 (four) hours as needed for wheezing or shortness of breath.   ALPRAZolam 0.25 MG tablet Commonly known as:  XANAX Take 1 tablet (0.25 mg total) by mouth at bedtime as needed.   calcium citrate-vitamin D 315-200 MG-UNIT tablet Commonly known as:  CITRACAL+D Take 1 tablet by mouth daily.   Cranberry 250 MG Caps Take 250 mg by mouth daily.   esomeprazole 40 MG capsule Commonly known as:  NEXIUM Take 1 capsule (40 mg total) by mouth 2 (two) times daily before a meal.   feeding supplement (GLUCERNA SHAKE) Liqd Take 237 mLs by mouth 2 (two) times daily between meals.   feeding supplement (PRO-STAT SUGAR FREE 64) Liqd Take 30 mLs by mouth 3 (three) times daily.   ferrous sulfate 325 (65 FE) MG tablet Take 325 mg by mouth daily with breakfast.   Fluticasone-Salmeterol 100-50 MCG/DOSE Aepb Commonly known as:  ADVAIR Inhale 1 puff into the lungs 2 (two) times daily. What changed:  when to take this  reasons to take this   gabapentin 100 MG capsule Commonly known as:  NEURONTIN Take 1-2 capsules at bedtime for nerve pain What changed:  how much to take  how to take this  when to take this  additional instructions   lubiprostone 8 MCG capsule Commonly known as:  AMITIZA Take 8 mcg by mouth daily with breakfast.   mirabegron ER 25 MG Tb24  tablet Commonly known as:  MYRBETRIQ Take 1 tablet (25 mg total) by mouth at bedtime. For bladder What changed:  when to take this  reasons to take this  additional instructions   multivitamin with minerals Tabs tablet Take 1 tablet by mouth daily.   PROBIOTIC DAILY PO Take 1 capsule by mouth daily.   promethazine 12.5 MG tablet Commonly known as:  PHENERGAN 1-2 po 30 MINS PRIOR TO MEDS OR MEALS. MAY REPEAT EVERY 4-6 h prn nausea or vomiting   Tiotropium Bromide Monohydrate 1.25 MCG/ACT Aers Commonly known as:  SPIRIVA RESPIMAT Inhale 2 puffs into the lungs daily.   VITAMIN B 12 PO Take 1 tablet by mouth daily.   ZETIA 10 MG tablet Generic drug:  ezetimibe TAKE 1 TABLET BY MOUTH ONCE A DAY.      Follow-up Information    Barney Drain, MD. Schedule an appointment as soon as possible for a visit in 2 week(s).   Specialty:  Gastroenterology Contact information: [place] [address] [phone]        Cristopher Peru, MD. Schedule an appointment as soon as possible for a visit in 2 week(s).   Specialty:  Cardiology Contact information: [address] [place] [address] [phone]        [name], MD. Schedule an appointment as soon as possible for a visit in 4 day(s).   Specialty:  Family Medicine Why:  hospital follow up and check CBC Contact information: [place] [address] [phone]          Allergies  Allergen Reactions  . Ace Inhibitors Hives, Shortness Of Breath and Swelling  . Aspirin Other (See Comments)    Blood in stool and nose bleed  . Codeine Swelling  . Pineapple Shortness Of Breath and Swelling  .  Plasticized Base [Plastibase] Hives and Swelling    No plastics  . Ultram [Tramadol] Swelling  . Adhesive [Tape] Swelling and Rash  . Naprosyn [Naproxen] Swelling  . Biaxin [Clarithromycin] Nausea And Vomiting and Other (See Comments)    Dizziness.  .  Clindamycin/Lincomycin Other (See Comments)    Burning sensation throat, abdomen  . Linzess [Linaclotide]     DIARRHEA, FECAL INCONTINENCE  . Metronidazole     NAUSEA AND WEAKNESS  . Nsaids   . Penicillins     Lip swelling, Hives Has patient had a PCN reaction causing immediate rash, facial/tongue/throat swelling, SOB or lightheadedness with hypotension:YES Has patient had a PCN reaction causing severe rash involving mucus membranes or skin necrosis: NO Has patient had a PCN reaction that required hospitalization NO Has patient had a PCN reaction occurring within the last 10 years: NO If all of the above answers are "NO", then may proceed with Cephalosporin use.  . Varenicline Tartrate Swelling    Procedures/Studies: Dg Chest 2 View  Result Date: 08/02/2016 CLINICAL DATA:  Weakness, dehydration, and poor p.o. intake. EXAM: CHEST  2 VIEW COMPARISON:  06/28/2016 FINDINGS: Pacemaker remains in place with leads terminating over the right atrium and right ventricle. Cardiac silhouette is normal in size. Tortuosity and calcification are again noted involving the thoracic aorta. The patient's hand partially obscures the left lung base on the AP radiograph. No airspace consolidation, edema, pleural effusion, or pneumothorax is identified. Mild thoracic spondylosis is noted. IMPRESSION: 1. No active cardiopulmonary disease. 2. Aortic atherosclerosis. Electronically Signed   By: Logan Bores M.D.   On: 08/02/2016 16:24   Dg Abd 1 View  Result Date: 08/18/2016 CLINICAL DATA:  Foreign body ingestion. EXAM: ABDOMEN - 1 VIEW COMPARISON:  06/28/2016 . FINDINGS: Surgical clips right upper quadrant. Radiopacity is noted over the right mid abdomen consistent patient's history of foreign body ingestion. Stool noted throughout the colon. No bowel distention. IMPRESSION: Radiopacity is noted over the right mid abdomen consistent patient's foreign body ingestion. Electronically Signed   By: Marcello Moores  Register   On:  08/18/2016 10:06   Dg Chest Portable 1 View  Result Date: 08/15/2016 CLINICAL DATA:  Weakness for 2 weeks. Shortness of breath. Upper mid back pain. History of diabetes and hypertension. EXAM: PORTABLE CHEST 1 VIEW COMPARISON:  08/02/2016 FINDINGS: Cardiac pacemaker. Shallow inspiration. Normal heart size and pulmonary vascularity. No focal airspace disease or consolidation in the lungs. No blunting of costophrenic angles. No pneumothorax. Mediastinal contours appear intact. Calcified and tortuous aorta. Degenerative changes in the shoulders. IMPRESSION: No active disease. Electronically Signed   By: Lucienne Capers M.D.   On: 08/15/2016 01:03    (Echo, Carotid, EGD, Colonoscopy, ERCP)   Subjective: Pt says her appetite is improving.  She says she is feeling better.    Discharge Exam: Vitals:   08/19/16 2151 08/20/16 0526  BP: (!) 99/54 (!) 116/51  Pulse: (!) 59 62  Resp: 16 16  Temp: 98.1 F (36.7 C) 98.6 F (37 C)   Vitals:   08/19/16 1953 08/19/16 2151 08/20/16 0526 08/20/16 0921  BP:  (!) 99/54 (!) 116/51   Pulse:  (!) 59 62   Resp:  16 16   Temp:  98.1 F (36.7 C) 98.6 F (37 C)   TempSrc:  Oral Oral   SpO2: 99% 100% 99% 94%  Weight:      Height:        General: Pt is alert, awake, not in acute distress Cardiovascular: RRR,  S1/S2 +, no rubs, no gallops Respiratory: CTA bilaterally, no wheezing, no rhonchi Abdominal: Soft, NT, ND, bowel sounds + Extremities: no edema, no cyanosis    The results of significant diagnostics from this hospitalization (including imaging, microbiology, ancillary and laboratory) are listed below for reference.     Microbiology: No results found for this or any previous visit (from the past 240 hour(s)).   Labs: BNP (last 3 results)  Recent Labs  08/15/16 0048  BNP 05.3   Basic Metabolic Panel:  Recent Labs Lab 08/15/16 0048 08/15/16 1154 08/17/16 0508 08/20/16 0620  NA 140 137 137 136  K 4.0 4.0 4.0 3.9  CL 109 107  109 109  CO2 '26 24 25 23  '$ GLUCOSE 176* 98 109* 91  BUN 41* 36* 35* 31*  CREATININE 1.14* 1.02* 0.90 1.08*  CALCIUM 8.8* 8.4* 8.4* 8.4*   Liver Function Tests:  Recent Labs Lab 08/15/16 1154  AST 27  ALT 17  ALKPHOS 31*  BILITOT 0.8  PROT 5.7*  ALBUMIN 3.2*   No results for input(s): LIPASE, AMYLASE in the last 168 hours. No results for input(s): AMMONIA in the last 168 hours. CBC:  Recent Labs Lab 08/15/16 0048  08/16/16 0927 08/17/16 0508 08/17/16 1249 08/18/16 0546 08/18/16 2158 08/19/16 0616 08/20/16 0620  WBC 9.6  < > 8.2 7.7  --  7.4  --  11.2* 7.1  NEUTROABS 6.5  --   --   --   --  4.7  --   --   --   HGB 5.4*  < > 8.0* 7.1* 7.4* 6.1* 9.3* 8.9* 8.0*  HCT 16.1*  < > 23.8* 21.3* 22.3* 18.5* 27.7* 26.7* 24.5*  MCV 95.8  < > 91.2 92.6  --  93.4  --  90.2 91.8  PLT 230  < > 191 184  --  175  --  172 181  < > = values in this interval not displayed. Cardiac Enzymes:  Recent Labs Lab 08/15/16 0048  TROPONINI <0.03   BNP: Invalid input(s): POCBNP CBG: No results for input(s): GLUCAP in the last 168 hours. D-Dimer No results for input(s): DDIMER in the last 72 hours. Hgb A1c No results for input(s): HGBA1C in the last 72 hours. Lipid Profile No results for input(s): CHOL, HDL, LDLCALC, TRIG, CHOLHDL, LDLDIRECT in the last 72 hours. Thyroid function studies No results for input(s): TSH, T4TOTAL, T3FREE, THYROIDAB in the last 72 hours.  Invalid input(s): FREET3 Anemia work up No results for input(s): VITAMINB12, FOLATE, FERRITIN, TIBC, IRON, RETICCTPCT in the last 72 hours. Urinalysis    Component Value Date/Time   COLORURINE YELLOW 08/02/2016 1510   APPEARANCEUR HAZY (A) 08/02/2016 1510   LABSPEC 1.018 08/02/2016 1510   PHURINE 5.0 08/02/2016 1510   GLUCOSEU NEGATIVE 08/02/2016 1510   HGBUR NEGATIVE 08/02/2016 1510   BILIRUBINUR NEGATIVE 08/02/2016 1510   KETONESUR NEGATIVE 08/02/2016 1510   PROTEINUR NEGATIVE 08/02/2016 1510   UROBILINOGEN 2 (H)  12/21/2014 1455   NITRITE NEGATIVE 08/02/2016 1510   LEUKOCYTESUR NEGATIVE 08/02/2016 1510   Sepsis Labs Invalid input(s): PROCALCITONIN,  WBC,  LACTICIDVEN Microbiology No results found for this or any previous visit (from the past 240 hour(s)).  Time coordinating discharge: 34 minutes  SIGNED:   Irwin Brakeman, MD  [facility] 08/20/2016, 12:06 PM Pager   If 7PM-7AM, please contact night-coverage [email or web address] Password [email or web address]

## 2016-08-20 NOTE — Progress Notes (Signed)
Hgb 9.7 Results given to patient. Patient stable for discharge.

## 2016-08-20 NOTE — Progress Notes (Signed)
08/20/2016 1:19 PM  Repeat Hg 9.7.  Ok to discharge. Pt and RN updated.   Murvin Natal, MD

## 2016-08-20 NOTE — Progress Notes (Signed)
Patient has no complaints. She denies nausea vomiting or abdominal pain. No BM in the last 2 days. Abdominal exam reveals full abdomen with normal bowel sounds and soft without tenderness organomegaly or masses. Hgb 8 g from this morning.  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 Gala Romney 2 days ago. No evidence of recurrent GI bleed. Drop in hemoglobin is secondary to hemodilution. Check another H&H before she goes home. Discussed with Dr. Wynetta Emery. She will follow-up at [facility] in 2 weeks.

## 2016-08-20 NOTE — Progress Notes (Signed)
Pt IV removed, tolerated well.  Reviewed discharge instructions with pt and daughter at bedside.  Answered questions at this time.  Notified [facility] of pt discharge, to resume [place] RN/PT.

## 2016-08-20 NOTE — Discharge Instructions (Signed)
Return if symptoms recur, worsen or new problems develop. Continue a soft diet for 2 more weeks. Follow-up with your primary care physician in the next 4-5 days to have a CBC rechecked. Please avoid all blood thinners for at least one week. Do not re-start any blood thinners without checking with your primary care provider.

## 2016-08-21 ENCOUNTER — Encounter (HOSPITAL_COMMUNITY): Payer: Self-pay | Admitting: Internal Medicine

## 2016-08-21 DIAGNOSIS — E1122 Type 2 diabetes mellitus with diabetic chronic kidney disease: Secondary | ICD-10-CM | POA: Diagnosis not present

## 2016-08-21 DIAGNOSIS — K219 Gastro-esophageal reflux disease without esophagitis: Secondary | ICD-10-CM | POA: Diagnosis not present

## 2016-08-21 DIAGNOSIS — I129 Hypertensive chronic kidney disease with stage 1 through stage 4 chronic kidney disease, or unspecified chronic kidney disease: Secondary | ICD-10-CM | POA: Diagnosis not present

## 2016-08-21 DIAGNOSIS — N183 Chronic kidney disease, stage 3 (moderate): Secondary | ICD-10-CM | POA: Diagnosis not present

## 2016-08-21 DIAGNOSIS — F17210 Nicotine dependence, cigarettes, uncomplicated: Secondary | ICD-10-CM | POA: Diagnosis not present

## 2016-08-21 DIAGNOSIS — K922 Gastrointestinal hemorrhage, unspecified: Secondary | ICD-10-CM | POA: Diagnosis not present

## 2016-08-21 DIAGNOSIS — J449 Chronic obstructive pulmonary disease, unspecified: Secondary | ICD-10-CM | POA: Diagnosis not present

## 2016-08-21 DIAGNOSIS — E785 Hyperlipidemia, unspecified: Secondary | ICD-10-CM | POA: Diagnosis not present

## 2016-08-21 DIAGNOSIS — D631 Anemia in chronic kidney disease: Secondary | ICD-10-CM | POA: Diagnosis not present

## 2016-08-22 ENCOUNTER — Encounter: Payer: Self-pay | Admitting: Family Medicine

## 2016-08-22 ENCOUNTER — Ambulatory Visit (INDEPENDENT_AMBULATORY_CARE_PROVIDER_SITE_OTHER): Payer: Medicare HMO | Admitting: Family Medicine

## 2016-08-22 VITALS — BP 112/74 | HR 84 | Temp 98.8°F | Resp 16 | Ht 67.0 in | Wt 147.0 lb

## 2016-08-22 DIAGNOSIS — I10 Essential (primary) hypertension: Secondary | ICD-10-CM | POA: Diagnosis not present

## 2016-08-22 DIAGNOSIS — K2970 Gastritis, unspecified, without bleeding: Secondary | ICD-10-CM | POA: Diagnosis not present

## 2016-08-22 DIAGNOSIS — K219 Gastro-esophageal reflux disease without esophagitis: Secondary | ICD-10-CM

## 2016-08-22 DIAGNOSIS — D649 Anemia, unspecified: Secondary | ICD-10-CM | POA: Diagnosis not present

## 2016-08-22 DIAGNOSIS — B9681 Helicobacter pylori [H. pylori] as the cause of diseases classified elsewhere: Secondary | ICD-10-CM | POA: Diagnosis not present

## 2016-08-22 LAB — CBC
HCT: 29.7 % — ABNORMAL LOW (ref 35.0–45.0)
Hemoglobin: 9.4 g/dL — ABNORMAL LOW (ref 12.0–15.0)
MCH: 30.5 pg (ref 27.0–33.0)
MCHC: 31.6 g/dL — AB (ref 32.0–36.0)
MCV: 96.4 fL (ref 80.0–100.0)
Platelets: 249 10*3/uL (ref 140–400)
RBC: 3.08 MIL/uL — ABNORMAL LOW (ref 3.80–5.10)
RDW: 18 % — ABNORMAL HIGH (ref 11.0–15.0)
WBC: 8 10*3/uL (ref 3.8–10.8)

## 2016-08-22 NOTE — Patient Instructions (Signed)
F/U 4 months  

## 2016-08-22 NOTE — Assessment & Plan Note (Addendum)
Symptoms much improved repeat Hb today 9.4 which is stable

## 2016-08-22 NOTE — Assessment & Plan Note (Signed)
refleux with H pylori gastritis on nexium BID Small bowel bleed controlled before discharge f/u GI

## 2016-08-22 NOTE — Progress Notes (Signed)
   Subjective:    Patient ID: Charlotte Henry, female    DOB: [birth year], 76 y.o.   MRN: [id number]  Patient presents for Hospital F/U (re-check CBC)  Pt here forAsthma follow-up and to have her CBC rechecked. She was hospitalized again secondary to acute blood loss anemia she was found to have bleeding in her small intestines. She Did Endoscopy and Then She Had Enteroscope Be Done to Control the Bleeding. Her Hemoglobin at Discharge Was 9.7 She Did Require Multiple Blood Transfusions As Her Initial Hemoglobin Was 5.4. She States That She Feels Much Better Now and Her Energy Is Starting to [employer]. She Was Discharged Home with Protein Supplement Protostat She Is Unable to Afford This Therefore Continues on the [place]. There Is Concern about What Blood Thinner She Is Supposed to Be on Secondary to Her Cardiac History She Will See Her Cardiologist Tomorrow to Decide This. Of Note She Is Also Still off for Antihypertensive Because of Lower Blood Pressures in the Setting of Her Anemia. She Has No New Concerns Today. She Did Have a Bowel Movement with the Use of MiraLAX Yesterday Did Not Have Any Blood in the Stool.  She does have physical therapy coming to the home He has had a little more swelling in her ankles and feet since she came home she is been trying to wear her compression hose. She is also trying to elevate her feet. She also states that she had some type of rash on her neck this occurred while she was in the hospital she's been using Neosporin and hydrocortisone for any itching and it is much improved.  Review Of Systems:  GEN- +fatigue, fever, weight loss,weakness, recent illness HEENT- denies eye drainage, change in vision, nasal discharge, CVS- denies chest pain, palpitations RESP- denies SOB, cough, wheeze ABD- denies N/V, change in stools, abd pain GU- denies dysuria, hematuria, dribbling, incontinence MSK- denies joint pain, muscle aches, injury Neuro- denies headache, dizziness,  syncope, seizure activity       Objective:    BP 112/74   Pulse 84   Temp 98.8 F (37.1 C) (Oral)   Resp 16   Ht '5\' 7"'$  (1.702 m)   Wt 147 lb (66.7 kg)   SpO2 97%   BMI 23.02 kg/m  GEN- NAD, alert and oriented x3,walking without assistance HEENT- PERRL, EOMI, non injected sclera, pink conjunctiva, MMM, oropharynx clear Neck- no LAD  CVS- RRR, systolic  murmur RESP-CTAB ABD-NABS,soft,NT,ND EXT- trace pedaledema Skin- excoriations, hyperigmented eczema like patch on her left post neck, no erythema Pulses- Radial  2+         Assessment & Plan:      Problem List Items Addressed This Visit    Symptomatic anemia - Primary    Symptoms much improved repeat Hb today 9.4 which is stable      Relevant Orders   CBC   Helicobacter pylori gastritis   Gastroesophageal reflux disease    refleux with H pylori gastritis on nexium BID Small bowel bleed controlled before discharge f/u GI      Essential hypertension    She still has low normotensive blood pressure off medications. She will see her cardiologist tomorrow. Decision will be made about what blood thinner to restart her on.         Note: This dictation was prepared with Dragon dictation along with smaller phrase technology. Any transcriptional errors that result from this process are unintentional.

## 2016-08-22 NOTE — Assessment & Plan Note (Signed)
She still has low normotensive blood pressure off medications. She will see her cardiologist tomorrow. Decision will be made about what blood thinner to restart her on.

## 2016-08-23 ENCOUNTER — Encounter: Payer: Self-pay | Admitting: Internal Medicine

## 2016-08-23 ENCOUNTER — Encounter (INDEPENDENT_AMBULATORY_CARE_PROVIDER_SITE_OTHER): Payer: Self-pay

## 2016-08-23 ENCOUNTER — Ambulatory Visit (INDEPENDENT_AMBULATORY_CARE_PROVIDER_SITE_OTHER): Payer: Medicare HMO | Admitting: Internal Medicine

## 2016-08-23 VITALS — BP 126/62 | HR 60 | Ht 67.0 in | Wt 141.0 lb

## 2016-08-23 DIAGNOSIS — I442 Atrioventricular block, complete: Secondary | ICD-10-CM

## 2016-08-23 LAB — CUP PACEART INCLINIC DEVICE CHECK
Battery Voltage: 3.01 V
Implantable Lead Implant Date: 20171020
Implantable Lead Location: 753859
Implantable Pulse Generator Implant Date: 20171020
Lead Channel Impedance Value: 437.5 Ohm
Lead Channel Impedance Value: 462.5 Ohm
Lead Channel Pacing Threshold Amplitude: 0.5 V
Lead Channel Pacing Threshold Amplitude: 0.5 V
Lead Channel Pacing Threshold Pulse Width: 0.4 ms
Lead Channel Pacing Threshold Pulse Width: 0.4 ms
Lead Channel Pacing Threshold Pulse Width: 0.4 ms
Lead Channel Sensing Intrinsic Amplitude: 1.8 mV
Lead Channel Sensing Intrinsic Amplitude: 7.7 mV
Lead Channel Setting Pacing Amplitude: 2 V
Lead Channel Setting Pacing Amplitude: 2.5 V
Lead Channel Setting Sensing Sensitivity: 3.5 mV
MDC IDC LEAD IMPLANT DT: 20171020
MDC IDC LEAD LOCATION: 753860
MDC IDC MSMT LEADCHNL RA PACING THRESHOLD AMPLITUDE: 0.5 V
MDC IDC MSMT LEADCHNL RA PACING THRESHOLD PULSEWIDTH: 0.4 ms
MDC IDC MSMT LEADCHNL RV PACING THRESHOLD AMPLITUDE: 0.5 V
MDC IDC PG SERIAL: 3180497
MDC IDC SESS DTM: 20180321140703
MDC IDC SET LEADCHNL RV PACING PULSEWIDTH: 0.4 ms
MDC IDC STAT BRADY RA PERCENT PACED: 14 %
MDC IDC STAT BRADY RV PERCENT PACED: 99.34 %

## 2016-08-23 NOTE — Progress Notes (Signed)
HPI Charlotte Henry returns today for ongoing PPM followup. 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 She has been stable from a heart rhythm perspective. She was initially treated with xarelto when she was discovered to have PAF but she has had 2 life threatening GI bleeds and ultimately thought to be contra-indicated to continue Xarelto.  Allergies  Allergen Reactions  . Ace Inhibitors Hives, Shortness Of Breath and Swelling  . Aspirin Other (See Comments)    Blood in stool and nose bleed  . Codeine Swelling  . Pineapple Shortness Of Breath and Swelling  . Plasticized Base [Plastibase] Hives and Swelling    No plastics  . Ultram [Tramadol] Swelling  . Adhesive [Tape] Swelling and Rash  . Naprosyn [Naproxen] Swelling  . Biaxin [Clarithromycin] Nausea And Vomiting and Other (See Comments)    Dizziness.  . Clindamycin/Lincomycin Other (See Comments)    Burning sensation throat, abdomen  . Linzess [Linaclotide]     DIARRHEA, FECAL INCONTINENCE  . Metronidazole     NAUSEA AND WEAKNESS  . Nsaids   . Penicillins     Lip swelling, Hives Has patient had a PCN reaction causing immediate rash, facial/tongue/throat swelling, SOB or lightheadedness with hypotension:YES Has patient had a PCN reaction causing severe rash involving mucus membranes or skin necrosis: NO Has patient had a PCN reaction that required hospitalization NO Has patient had a PCN reaction occurring within the last 10 years: NO If all of the above answers are "NO", then may proceed with Cephalosporin use.  . Varenicline Tartrate Swelling     Current Outpatient Prescriptions  Medication Sig Dispense Refill  . acetaminophen (TYLENOL) 500 MG tablet Take 500 mg by mouth every 6 (six) hours as needed for mild pain or moderate pain.    Marland Kitchen albuterol (PROVENTIL HFA;VENTOLIN HFA) 108 (90 Base) MCG/ACT inhaler Inhale 2 puffs into the lungs every 4  (four) hours as needed for wheezing or shortness of breath. 1 Inhaler 2  . ALPRAZolam (XANAX) 0.25 MG tablet Take 1 tablet (0.25 mg total) by mouth at bedtime as needed. 30 tablet 1  . Amino Acids-Protein Hydrolys (FEEDING SUPPLEMENT, PRO-STAT SUGAR FREE 64,) LIQD Take 30 mLs by mouth 3 (three) times daily. (Patient not taking: Reported on 08/22/2016) 2700 mL 0  . calcium citrate-vitamin D (CITRACAL+D) 315-200 MG-UNIT per tablet Take 1 tablet by mouth daily.      . Cranberry 250 MG CAPS Take 250 mg by mouth daily.    . Cyanocobalamin (VITAMIN B 12 PO) Take 1 tablet by mouth daily.     Marland Kitchen esomeprazole (NEXIUM) 40 MG capsule Take 1 capsule (40 mg total) by mouth 2 (two) times daily before a meal. 60 capsule 0  . feeding supplement, GLUCERNA SHAKE, (GLUCERNA SHAKE) LIQD Take 237 mLs by mouth 2 (two) times daily between meals. 14220 mL 0  . ferrous sulfate 325 (65 FE) MG tablet Take 325 mg by mouth daily with breakfast.    . Fluticasone-Salmeterol (ADVAIR) 100-50 MCG/DOSE AEPB Inhale 1 puff into the lungs 2 (two) times daily. 1 each 3  . gabapentin (NEURONTIN) 100 MG capsule Take 1-2 capsules at bedtime for nerve pain 60 capsule 2  . lubiprostone (AMITIZA) 8 MCG capsule Take 8 mcg by mouth daily with breakfast.    . mirabegron ER (MYRBETRIQ) 25 MG TB24 tablet Take 1 tablet (25 mg total) by mouth at bedtime. For bladder  30 tablet 6  . Multiple Vitamin (MULTIVITAMIN WITH MINERALS) TABS tablet Take 1 tablet by mouth daily.    . Probiotic Product (PROBIOTIC DAILY PO) Take 1 capsule by mouth daily.     . promethazine (PHENERGAN) 12.5 MG tablet 1-2 po 30 MINS PRIOR TO MEDS OR MEALS. MAY REPEAT EVERY 4-6 h prn nausea or vomiting 40 tablet 1  . Tiotropium Bromide Monohydrate (SPIRIVA RESPIMAT) 1.25 MCG/ACT AERS Inhale 2 puffs into the lungs daily. 1 Inhaler 11  . ZETIA 10 MG tablet TAKE 1 TABLET BY MOUTH ONCE A DAY. 30 tablet 6   No current facility-administered medications for this visit.      Past Medical  History:  Diagnosis Date  . 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 ASCVD (arteriosclerotic cardiovascular disease)    a. cath in 4/04,-80%LAD; 70% RCA, -> DESx2; residual 60% distal RCA; nl EF  b. Nuc 02/2016 aborted due to bradycardia. Will need to be rescheduled   . Carotid artery occlusion   . Cerebrovascular disease   . CHB (complete heart block) ([place])    a. s/p PPM on 03/24/16 with a St. Jude (serial number [id number]) pacemaker  . Chronic kidney disease (CKD), stage III (moderate)   . Clostridium difficile colitis 03/2013   a. 03/2013  . COPD (chronic obstructive pulmonary disease) ([place])   . Coronary disease   . Diabetes mellitus   . Diverticulosis   . GERD (gastroesophageal reflux disease)   . Gout   . H pylori ulcer   . Hyperlipidemia    chose to stop statin, takes Zetia  . Hypertension    not currently on medication  . IBS (irritable bowel syndrome)   . PVD (peripheral vascular disease) ([place])    a. moderate to severely decreased ABI's in 8/08, sig. aortic inflow disease b. repeat ABI's in 2011 improved- mild disease on the left and moderate to severe disease on the right  . S/P placement of cardiac pacemaker    a. 03/24/16: St. Jude (serial number [id number]) pacemaker  . TIA (transient ischemic attack) 2012  . Tobacco abuse    a. 50 pack years continuing at 1/2 pack per day    ROS:   All systems reviewed and negative except as noted in the HPI.   Past Surgical History:  Procedure Laterality Date  . ABDOMINAL HYSTERECTOMY    . carpel tunnel release     bilateral  . CHOLECYSTECTOMY    . COLONOSCOPY  2009  . COLONOSCOPY N/A 08/04/2016   incomplete due to prep.   . COLONOSCOPY N/A 08/05/2016   Dr. Oneida Alar: moderately redundant rectosigmoid and sigmoid, normal TI, single large-mouthed diverticulum in hepatic flexure, external hemorrhoids, path negative for microscopic colitis   . ENTEROSCOPY N/A 08/18/2016   Procedure: ENTEROSCOPY;  Surgeon: Daneil Dolin, MD;   [facility];  Service: Endoscopy;  Laterality: N/A;  Pediatric colonoscope  . EP IMPLANTABLE DEVICE N/A 03/24/2016   Procedure: Pacemaker Implant;  Surgeon: [name], MD;  Location: [facility];  Service: Cardiovascular;  Laterality: N/A;  . ESOPHAGOGASTRODUODENOSCOPY N/A 08/04/2016   Dr. Oneida Alar: non-critical Schatzki's ring, small hiatal hernia, +H.pylori gastritis on path.   Marland Kitchen GIVENS CAPSULE STUDY N/A 08/16/2016   Procedure: GIVENS CAPSULE STUDY;  Surgeon: Daneil Dolin, MD;  [facility];  Service: Endoscopy;  Laterality: N/A;  . TOTAL ABDOMINAL HYSTERECTOMY W/ BILATERAL SALPINGOOPHORECTOMY       Family History  Problem Relation Age of Onset  .  Liver disease Mother 77  . Hypertension Mother   . Cerebral aneurysm Father   . Aneurysm Father 16  . Cancer Sister   . Liver disease Brother   . Liver disease Brother   . Liver disease Brother   . Diabetes type II Brother   . Alcohol abuse Brother   . Cancer Sister      Social History   Social History  . Marital status: Widowed    Spouse name: N/A  . Number of children: N/A  . Years of education: N/A   Occupational History  . Not on file.   Social History Main Topics  . Smoking status: Current Every Day Smoker    Packs/day: 0.50    Years: 50.00    Types: Cigarettes    Start date: 02/02/1966  . Smokeless tobacco: Never Used     Comment: 1/2 pack daily  . Alcohol use No  . Drug use: No  . Sexual activity: Not Currently   Other Topics Concern  . Not on file   Social History Narrative  . No narrative on file     BP 126/62   Pulse 60   Ht '5\' 7"'$  (1.702 m)   Wt 141 lb (64 kg)   BMI 22.08 kg/m   Physical Exam:  Well appearing 76 yo woman, NAD HEENT: Unremarkable Neck:  No JVD, no thyromegally Lymphatics:  No adenopathy Back:  No CVA tenderness Lungs:  Clear with no wheezes HEART:  Regular brady rhythm, no murmurs, no rubs, no clicks Abd:  soft, positive bowel sounds, no  organomegally, no rebound, no guarding Ext:  2 plus pulses, no edema, no cyanosis, no clubbing Skin:  No rashes no nodules Neuro:  CN II through XII intact, motor grossly intact  Assess/Plan: 1. High grade heart block -she is s/p PPM insertion. She is asymptomatic now 2. HTN - her blood pressure is well controlled on medical therapy. 3. PAF - she is asymptomatic. She is not a candidate for anti-coagulation with small bowel bleeding 4. Peripheral vascular disease - she has known disease including carotid disease as well. She has been encouraged to stop smoking. She has followup with Dr. Scot Dock.  Mikle [email or web address].D.

## 2016-08-23 NOTE — Patient Instructions (Signed)
Medication Instructions:  Your physician recommends that you continue on your current medications as directed. Please refer to the Current Medication list given to you today.   Labwork: None ordered  Testing/Procedures: None ordered  Follow-Up: Remote monitoring is used to monitor your Pacemaker from home. This monitoring reduces the number of office visits required to check your device to one time per year. It allows [place] to keep an eye on the functioning of your device to ensure it is working properly. You are scheduled for a device check from home on 11/22/16. You may send your transmission at any time that day. If you have a wireless device, the transmission will be sent automatically. After your physician reviews your transmission, you will receive a postcard with your next transmission date.  Your physician wants you to follow-up in: October 2018 with Dr. Lovena Le. You will receive a reminder letter in the mail two months in advance. If you don't receive a letter, please call our office to schedule the follow-up appointment.   Any Other Special Instructions Will Be Listed Below (If Applicable).     If you need a refill on your cardiac medications before your next appointment, please call your pharmacy.

## 2016-08-24 ENCOUNTER — Ambulatory Visit: Payer: Medicare HMO | Admitting: Gastroenterology

## 2016-08-24 DIAGNOSIS — F17210 Nicotine dependence, cigarettes, uncomplicated: Secondary | ICD-10-CM | POA: Diagnosis not present

## 2016-08-24 DIAGNOSIS — K922 Gastrointestinal hemorrhage, unspecified: Secondary | ICD-10-CM | POA: Diagnosis not present

## 2016-08-24 DIAGNOSIS — D631 Anemia in chronic kidney disease: Secondary | ICD-10-CM | POA: Diagnosis not present

## 2016-08-24 DIAGNOSIS — N183 Chronic kidney disease, stage 3 (moderate): Secondary | ICD-10-CM | POA: Diagnosis not present

## 2016-08-24 DIAGNOSIS — J449 Chronic obstructive pulmonary disease, unspecified: Secondary | ICD-10-CM | POA: Diagnosis not present

## 2016-08-24 DIAGNOSIS — K219 Gastro-esophageal reflux disease without esophagitis: Secondary | ICD-10-CM | POA: Diagnosis not present

## 2016-08-24 DIAGNOSIS — E1122 Type 2 diabetes mellitus with diabetic chronic kidney disease: Secondary | ICD-10-CM | POA: Diagnosis not present

## 2016-08-24 DIAGNOSIS — E785 Hyperlipidemia, unspecified: Secondary | ICD-10-CM | POA: Diagnosis not present

## 2016-08-24 DIAGNOSIS — I129 Hypertensive chronic kidney disease with stage 1 through stage 4 chronic kidney disease, or unspecified chronic kidney disease: Secondary | ICD-10-CM | POA: Diagnosis not present

## 2016-08-28 DIAGNOSIS — I129 Hypertensive chronic kidney disease with stage 1 through stage 4 chronic kidney disease, or unspecified chronic kidney disease: Secondary | ICD-10-CM | POA: Diagnosis not present

## 2016-08-28 DIAGNOSIS — E1122 Type 2 diabetes mellitus with diabetic chronic kidney disease: Secondary | ICD-10-CM | POA: Diagnosis not present

## 2016-08-28 DIAGNOSIS — K922 Gastrointestinal hemorrhage, unspecified: Secondary | ICD-10-CM | POA: Diagnosis not present

## 2016-08-28 DIAGNOSIS — N183 Chronic kidney disease, stage 3 (moderate): Secondary | ICD-10-CM | POA: Diagnosis not present

## 2016-08-28 DIAGNOSIS — D631 Anemia in chronic kidney disease: Secondary | ICD-10-CM | POA: Diagnosis not present

## 2016-08-28 DIAGNOSIS — F17210 Nicotine dependence, cigarettes, uncomplicated: Secondary | ICD-10-CM | POA: Diagnosis not present

## 2016-08-28 DIAGNOSIS — J449 Chronic obstructive pulmonary disease, unspecified: Secondary | ICD-10-CM | POA: Diagnosis not present

## 2016-08-28 DIAGNOSIS — K219 Gastro-esophageal reflux disease without esophagitis: Secondary | ICD-10-CM | POA: Diagnosis not present

## 2016-08-28 DIAGNOSIS — E785 Hyperlipidemia, unspecified: Secondary | ICD-10-CM | POA: Diagnosis not present

## 2016-08-30 DIAGNOSIS — N183 Chronic kidney disease, stage 3 (moderate): Secondary | ICD-10-CM | POA: Diagnosis not present

## 2016-08-30 DIAGNOSIS — I129 Hypertensive chronic kidney disease with stage 1 through stage 4 chronic kidney disease, or unspecified chronic kidney disease: Secondary | ICD-10-CM | POA: Diagnosis not present

## 2016-08-30 DIAGNOSIS — E1122 Type 2 diabetes mellitus with diabetic chronic kidney disease: Secondary | ICD-10-CM | POA: Diagnosis not present

## 2016-08-30 DIAGNOSIS — K219 Gastro-esophageal reflux disease without esophagitis: Secondary | ICD-10-CM | POA: Diagnosis not present

## 2016-08-30 DIAGNOSIS — E785 Hyperlipidemia, unspecified: Secondary | ICD-10-CM | POA: Diagnosis not present

## 2016-08-30 DIAGNOSIS — F17210 Nicotine dependence, cigarettes, uncomplicated: Secondary | ICD-10-CM | POA: Diagnosis not present

## 2016-08-30 DIAGNOSIS — J449 Chronic obstructive pulmonary disease, unspecified: Secondary | ICD-10-CM | POA: Diagnosis not present

## 2016-08-30 DIAGNOSIS — D631 Anemia in chronic kidney disease: Secondary | ICD-10-CM | POA: Diagnosis not present

## 2016-08-30 DIAGNOSIS — K922 Gastrointestinal hemorrhage, unspecified: Secondary | ICD-10-CM | POA: Diagnosis not present

## 2016-09-04 DIAGNOSIS — D631 Anemia in chronic kidney disease: Secondary | ICD-10-CM | POA: Diagnosis not present

## 2016-09-04 DIAGNOSIS — N183 Chronic kidney disease, stage 3 (moderate): Secondary | ICD-10-CM | POA: Diagnosis not present

## 2016-09-04 DIAGNOSIS — I129 Hypertensive chronic kidney disease with stage 1 through stage 4 chronic kidney disease, or unspecified chronic kidney disease: Secondary | ICD-10-CM | POA: Diagnosis not present

## 2016-09-04 DIAGNOSIS — F17210 Nicotine dependence, cigarettes, uncomplicated: Secondary | ICD-10-CM | POA: Diagnosis not present

## 2016-09-04 DIAGNOSIS — J449 Chronic obstructive pulmonary disease, unspecified: Secondary | ICD-10-CM | POA: Diagnosis not present

## 2016-09-04 DIAGNOSIS — K922 Gastrointestinal hemorrhage, unspecified: Secondary | ICD-10-CM | POA: Diagnosis not present

## 2016-09-04 DIAGNOSIS — E1122 Type 2 diabetes mellitus with diabetic chronic kidney disease: Secondary | ICD-10-CM | POA: Diagnosis not present

## 2016-09-04 DIAGNOSIS — K219 Gastro-esophageal reflux disease without esophagitis: Secondary | ICD-10-CM | POA: Diagnosis not present

## 2016-09-04 DIAGNOSIS — E785 Hyperlipidemia, unspecified: Secondary | ICD-10-CM | POA: Diagnosis not present

## 2016-09-05 ENCOUNTER — Ambulatory Visit (INDEPENDENT_AMBULATORY_CARE_PROVIDER_SITE_OTHER): Payer: Medicare HMO | Admitting: Nurse Practitioner

## 2016-09-05 ENCOUNTER — Encounter: Payer: Self-pay | Admitting: Nurse Practitioner

## 2016-09-05 VITALS — BP 128/65 | HR 60 | Temp 98.4°F | Ht 67.0 in | Wt 139.4 lb

## 2016-09-05 DIAGNOSIS — D649 Anemia, unspecified: Secondary | ICD-10-CM | POA: Diagnosis not present

## 2016-09-05 DIAGNOSIS — R531 Weakness: Secondary | ICD-10-CM

## 2016-09-05 NOTE — Progress Notes (Signed)
Referring Provider: Alycia Rossetti, MD Primary Care Physician:  [name], MD Primary GI:  Dr. Oneida Alar  Chief Complaint  Patient presents with  . Anemia    f/u, no problems    HPI:   Charlotte Henry is a 76 y.o. female who presents For post hospitalization follow-up for significant anemia. The patient has had multiple admissions for anemia and GI bleed with upper endoscopy and colonoscopy completed without overt source identified. Capsule endoscopy found significant bleeding in the small bowel. 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 The area was inked proximally and distally. She was discharged from the hospital on 08/20/2016 and her last 3 hemoglobins were 9.7 and 9.4.  Today she states she's doing better overall. She is getting some energy back, not as strong but "I'm getting there." Is not dizzy or fainting like she was. Denies abdominal pain, N/V, hematochezia, melena. Denies chest pain, dyspnea, dizziness, lightheadedness, syncope, near syncope. Denies any other upper or lower GI symptoms.  Past Medical History:  Diagnosis Date  . 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 ASCVD (arteriosclerotic cardiovascular disease)    a. cath in 4/04,-80%LAD; 70% RCA, -> DESx2; residual 60% distal RCA; nl EF  b. Nuc 02/2016 aborted due to bradycardia. Will need to be rescheduled   . Carotid artery occlusion   . Cerebrovascular disease   . CHB (complete heart block) ([place])    a. s/p PPM on 03/24/16 with a St. Jude (serial number [id number]) pacemaker  . Chronic kidney disease (CKD), stage III (moderate)   . Clostridium difficile colitis 03/2013   a. 03/2013  . COPD (chronic obstructive pulmonary disease) ([place])   . Coronary disease   . Diabetes mellitus   . Diverticulosis   . GERD (gastroesophageal reflux disease)   . Gout   . H  pylori ulcer   . Hyperlipidemia    chose to stop statin, takes Zetia  . Hypertension    not currently on medication  . IBS (irritable bowel syndrome)   . PVD (peripheral vascular disease) ([place])    a. moderate to severely decreased ABI's in 8/08, sig. aortic inflow disease b. repeat ABI's in 2011 improved- mild disease on the left and moderate to severe disease on the right  . S/P placement of cardiac pacemaker    a. 03/24/16: St. Jude (serial number [id number]) pacemaker  . TIA (transient ischemic attack) 2012  . Tobacco abuse    a. 50 pack years continuing at 1/2 pack per day    Past Surgical History:  Procedure Laterality Date  . ABDOMINAL HYSTERECTOMY    . carpel tunnel release     bilateral  . CHOLECYSTECTOMY    . COLONOSCOPY  2009  . COLONOSCOPY N/A 08/04/2016   incomplete due to prep.   . COLONOSCOPY N/A 08/05/2016   Dr. Oneida Alar: moderately redundant rectosigmoid and sigmoid, normal TI, single large-mouthed diverticulum in hepatic flexure, external hemorrhoids, path negative for microscopic colitis   . ENTEROSCOPY N/A 08/18/2016   Procedure: ENTEROSCOPY;  Surgeon: Daneil Dolin, MD;  [facility];  Service: Endoscopy;  Laterality: N/A;  Pediatric colonoscope  . EP IMPLANTABLE DEVICE N/A 03/24/2016   Procedure: Pacemaker Implant;  Surgeon: [name], MD;  Location: [facility];  Service: Cardiovascular;  Laterality: N/A;  . ESOPHAGOGASTRODUODENOSCOPY N/A 08/04/2016   Dr. Oneida Alar: non-critical Schatzki's  ring, small hiatal hernia, +H.pylori gastritis on path.   Marland Kitchen GIVENS CAPSULE STUDY N/A 08/16/2016   Procedure: GIVENS CAPSULE STUDY;  Surgeon: Daneil Dolin, MD;  [facility];  Service: Endoscopy;  Laterality: N/A;  . TOTAL ABDOMINAL HYSTERECTOMY W/ BILATERAL SALPINGOOPHORECTOMY      Current Outpatient Prescriptions  Medication Sig Dispense Refill  . acetaminophen (TYLENOL) 500 MG tablet Take 500 mg by mouth every 6 (six) hours as needed for mild pain  or moderate pain.    Marland Kitchen albuterol (PROVENTIL HFA;VENTOLIN HFA) 108 (90 Base) MCG/ACT inhaler Inhale 2 puffs into the lungs every 4 (four) hours as needed for wheezing or shortness of breath. 1 Inhaler 2  . ALPRAZolam (XANAX) 0.25 MG tablet Take 1 tablet (0.25 mg total) by mouth at bedtime as needed. 30 tablet 1  . calcium citrate-vitamin D (CITRACAL+D) 315-200 MG-UNIT per tablet Take 1 tablet by mouth daily.      . Cranberry 250 MG CAPS Take 250 mg by mouth daily.    . Cyanocobalamin (VITAMIN B 12 PO) Take 1 tablet by mouth daily.     Marland Kitchen esomeprazole (NEXIUM) 40 MG capsule Take 1 capsule (40 mg total) by mouth 2 (two) times daily before a meal. 60 capsule 0  . ferrous sulfate 325 (65 FE) MG tablet Take 325 mg by mouth daily with breakfast.    . Fluticasone-Salmeterol (ADVAIR) 100-50 MCG/DOSE AEPB Inhale 1 puff into the lungs 2 (two) times daily. 1 each 3  . gabapentin (NEURONTIN) 100 MG capsule Take 1-2 capsules at bedtime for nerve pain 60 capsule 2  . lubiprostone (AMITIZA) 8 MCG capsule Take 8 mcg by mouth daily with breakfast.    . mirabegron ER (MYRBETRIQ) 25 MG TB24 tablet Take 1 tablet (25 mg total) by mouth at bedtime. For bladder (Patient taking differently: Take 25 mg by mouth as needed. For bladder) 30 tablet 6  . Multiple Vitamin (MULTIVITAMIN WITH MINERALS) TABS tablet Take 1 tablet by mouth daily.    . Probiotic Product (PROBIOTIC DAILY PO) Take 1 capsule by mouth daily.     . promethazine (PHENERGAN) 12.5 MG tablet 1-2 po 30 MINS PRIOR TO MEDS OR MEALS. MAY REPEAT EVERY 4-6 h prn nausea or vomiting 40 tablet 1  . Tiotropium Bromide Monohydrate (SPIRIVA RESPIMAT) 1.25 MCG/ACT AERS Inhale 2 puffs into the lungs daily. (Patient taking differently: Inhale 2 puffs into the lungs as needed. ) 1 Inhaler 11  . ZETIA 10 MG tablet TAKE 1 TABLET BY MOUTH ONCE A DAY. 30 tablet 6   No current facility-administered medications for this visit.     Allergies as of 09/05/2016 - Review Complete  09/05/2016  Allergen Reaction Noted  . Ace inhibitors Hives, Shortness Of Breath, and Swelling 10/27/2008  . Aspirin Other (See Comments) 10/27/2008  . Codeine Swelling 02/02/2011  . Pineapple Shortness Of Breath and Swelling 09/03/2013  . Plasticized base [plastibase] Hives and Swelling 02/07/2012  . Ultram [tramadol] Swelling 03/21/2012  . Adhesive [tape] Swelling and Rash 02/07/2012  . Naprosyn [naproxen] Swelling 02/02/2011  . Biaxin [clarithromycin] Nausea And Vomiting and Other (See Comments) 07/04/2016  . Clindamycin/lincomycin Other (See Comments) 05/04/2014  . Linzess [linaclotide]  02/26/2013  . Metronidazole  06/29/2016  . Nsaids  03/04/2012  . Penicillins  05/04/2014  . Varenicline tartrate Swelling 10/27/2008    Family History  Problem Relation Age of Onset  . Liver disease Mother 32  . Hypertension Mother   . Cerebral aneurysm Father   . Aneurysm  Father 21  . Cancer Sister   . Liver disease Brother   . Liver disease Brother   . Liver disease Brother   . Diabetes type II Brother   . Alcohol abuse Brother   . Cancer Sister     Social History   Social History  . Marital status: Widowed    Spouse name: N/A  . Number of children: N/A  . Years of education: N/A   Social History Main Topics  . Smoking status: Current Every Day Smoker    Packs/day: 0.50    Years: 50.00    Types: Cigarettes    Start date: 02/02/1966  . Smokeless tobacco: Never Used     Comment: 1/2 pack daily  . Alcohol use No  . Drug use: No  . Sexual activity: Not Currently   Other Topics Concern  . None   Social History Narrative  . None    Review of Systems: Complete ROS negative except as per HPI.   Physical Exam: BP 128/65   Pulse 60   Temp 98.4 F (36.9 C) (Oral)   Ht '5\' 7"'$  (1.702 m)   Wt 139 lb 6.4 oz (63.2 kg)   BMI 21.83 kg/m  General:   Alert and oriented. Pleasant and cooperative. Well-nourished and well-developed.  Head:  Normocephalic and atraumatic. Eyes:   Without icterus, sclera clear and conjunctiva pink.  Ears:  Normal auditory acuity. Cardiovascular:  S1, S2 present without murmurs appreciated. Extremities without clubbing or edema. Respiratory:  Clear to auscultation bilaterally. No wheezes, rales, or rhonchi. No distress.  Gastrointestinal:  +BS, soft, non-tender and non-distended. No HSM noted. No guarding or rebound. No masses appreciated.  Rectal:  Deferred  Neurologic:  Alert and oriented x4;  grossly normal neurologically. Psych:  Alert and cooperative. Normal mood and affect. Heme/Lymph/Immune: No excessive bruising noted.    09/05/2016 9:02 AM   Disclaimer: This note was dictated with voice recognition software. Similar sounding words can inadvertently be transcribed and may not be corrected upon review.

## 2016-09-05 NOTE — Assessment & Plan Note (Signed)
Weakness continues to improve with identification of etiology behind GI bleed and subsequent treatment. She is no longer on Xarelto or aspirin. Still with some weakness, but states it is significantly improved. CBC as per above. Return for follow-up in 2 months.

## 2016-09-05 NOTE — Patient Instructions (Signed)
1. Have your lab tests drawn when you're able to. 2. We will call you with the results. 3. Return for follow-up in 2 months. 4. Call us if you have any worsening symptoms.

## 2016-09-05 NOTE — Progress Notes (Signed)
CC'ED TO PCP 

## 2016-09-05 NOTE — Assessment & Plan Note (Signed)
Symptomatic anemia on multiple admissions recently for GI bleed of unknown etiology. 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 Bleeding was controlled with placement of 4 clips, injection of 1-10,000 epinephrine solution, and tattooing of the area of concern. Her hemoglobin was stable by time of discharge in the mid 9 range. States she is not having any problems currently. Her symptoms have slowly improved. She is seeing physical therapy for strengthening. We will check a CBC today and have her return for follow-up in 2 months. She was requested to call us for any worsening symptoms. ER precautions given as well.

## 2016-09-06 DIAGNOSIS — K922 Gastrointestinal hemorrhage, unspecified: Secondary | ICD-10-CM | POA: Diagnosis not present

## 2016-09-06 DIAGNOSIS — J449 Chronic obstructive pulmonary disease, unspecified: Secondary | ICD-10-CM | POA: Diagnosis not present

## 2016-09-06 DIAGNOSIS — E1122 Type 2 diabetes mellitus with diabetic chronic kidney disease: Secondary | ICD-10-CM | POA: Diagnosis not present

## 2016-09-06 DIAGNOSIS — K219 Gastro-esophageal reflux disease without esophagitis: Secondary | ICD-10-CM | POA: Diagnosis not present

## 2016-09-06 DIAGNOSIS — N183 Chronic kidney disease, stage 3 (moderate): Secondary | ICD-10-CM | POA: Diagnosis not present

## 2016-09-06 DIAGNOSIS — E785 Hyperlipidemia, unspecified: Secondary | ICD-10-CM | POA: Diagnosis not present

## 2016-09-06 DIAGNOSIS — F17210 Nicotine dependence, cigarettes, uncomplicated: Secondary | ICD-10-CM | POA: Diagnosis not present

## 2016-09-06 DIAGNOSIS — D631 Anemia in chronic kidney disease: Secondary | ICD-10-CM | POA: Diagnosis not present

## 2016-09-06 DIAGNOSIS — I129 Hypertensive chronic kidney disease with stage 1 through stage 4 chronic kidney disease, or unspecified chronic kidney disease: Secondary | ICD-10-CM | POA: Diagnosis not present

## 2016-09-06 LAB — CBC WITH DIFFERENTIAL/PLATELET
BASOS PCT: 0 %
Basophils Absolute: 0 cells/uL (ref 0–200)
EOS ABS: 82 {cells}/uL (ref 15–500)
Eosinophils Relative: 1 %
HEMATOCRIT: 33.1 % — AB (ref 35.0–45.0)
HEMOGLOBIN: 10.6 g/dL — AB (ref 11.7–15.5)
LYMPHS ABS: 2132 {cells}/uL (ref 850–3900)
Lymphocytes Relative: 26 %
MCH: 29.5 pg (ref 27.0–33.0)
MCHC: 32 g/dL (ref 32.0–36.0)
MCV: 92.2 fL (ref 80.0–100.0)
MONO ABS: 820 {cells}/uL (ref 200–950)
MPV: 9.5 fL (ref 7.5–12.5)
Monocytes Relative: 10 %
Neutro Abs: 5166 cells/uL (ref 1500–7800)
Neutrophils Relative %: 63 %
Platelets: 268 10*3/uL (ref 140–400)
RBC: 3.59 MIL/uL — ABNORMAL LOW (ref 3.80–5.10)
RDW: 16.2 % — ABNORMAL HIGH (ref 11.0–15.0)
WBC: 8.2 10*3/uL (ref 3.8–10.8)

## 2016-09-07 NOTE — Progress Notes (Signed)
LMOM to call.

## 2016-09-07 NOTE — Progress Notes (Signed)
Pt is aware.  

## 2016-09-08 DIAGNOSIS — K922 Gastrointestinal hemorrhage, unspecified: Secondary | ICD-10-CM | POA: Diagnosis not present

## 2016-09-08 DIAGNOSIS — J449 Chronic obstructive pulmonary disease, unspecified: Secondary | ICD-10-CM | POA: Diagnosis not present

## 2016-09-08 DIAGNOSIS — E785 Hyperlipidemia, unspecified: Secondary | ICD-10-CM | POA: Diagnosis not present

## 2016-09-08 DIAGNOSIS — K219 Gastro-esophageal reflux disease without esophagitis: Secondary | ICD-10-CM | POA: Diagnosis not present

## 2016-09-08 DIAGNOSIS — N183 Chronic kidney disease, stage 3 (moderate): Secondary | ICD-10-CM | POA: Diagnosis not present

## 2016-09-08 DIAGNOSIS — I129 Hypertensive chronic kidney disease with stage 1 through stage 4 chronic kidney disease, or unspecified chronic kidney disease: Secondary | ICD-10-CM | POA: Diagnosis not present

## 2016-09-08 DIAGNOSIS — D631 Anemia in chronic kidney disease: Secondary | ICD-10-CM | POA: Diagnosis not present

## 2016-09-08 DIAGNOSIS — E1122 Type 2 diabetes mellitus with diabetic chronic kidney disease: Secondary | ICD-10-CM | POA: Diagnosis not present

## 2016-09-08 DIAGNOSIS — F17210 Nicotine dependence, cigarettes, uncomplicated: Secondary | ICD-10-CM | POA: Diagnosis not present

## 2016-09-13 DIAGNOSIS — K219 Gastro-esophageal reflux disease without esophagitis: Secondary | ICD-10-CM | POA: Diagnosis not present

## 2016-09-13 DIAGNOSIS — E1122 Type 2 diabetes mellitus with diabetic chronic kidney disease: Secondary | ICD-10-CM | POA: Diagnosis not present

## 2016-09-13 DIAGNOSIS — J449 Chronic obstructive pulmonary disease, unspecified: Secondary | ICD-10-CM | POA: Diagnosis not present

## 2016-09-13 DIAGNOSIS — E785 Hyperlipidemia, unspecified: Secondary | ICD-10-CM | POA: Diagnosis not present

## 2016-09-13 DIAGNOSIS — F17210 Nicotine dependence, cigarettes, uncomplicated: Secondary | ICD-10-CM | POA: Diagnosis not present

## 2016-09-13 DIAGNOSIS — D631 Anemia in chronic kidney disease: Secondary | ICD-10-CM | POA: Diagnosis not present

## 2016-09-13 DIAGNOSIS — K922 Gastrointestinal hemorrhage, unspecified: Secondary | ICD-10-CM | POA: Diagnosis not present

## 2016-09-13 DIAGNOSIS — N183 Chronic kidney disease, stage 3 (moderate): Secondary | ICD-10-CM | POA: Diagnosis not present

## 2016-09-13 DIAGNOSIS — I129 Hypertensive chronic kidney disease with stage 1 through stage 4 chronic kidney disease, or unspecified chronic kidney disease: Secondary | ICD-10-CM | POA: Diagnosis not present

## 2016-09-18 NOTE — Progress Notes (Signed)
REVIEWED-NO ADDITIONAL RECOMMENDATIONS. 

## 2016-09-20 DIAGNOSIS — F17210 Nicotine dependence, cigarettes, uncomplicated: Secondary | ICD-10-CM | POA: Diagnosis not present

## 2016-09-20 DIAGNOSIS — E1122 Type 2 diabetes mellitus with diabetic chronic kidney disease: Secondary | ICD-10-CM | POA: Diagnosis not present

## 2016-09-20 DIAGNOSIS — E785 Hyperlipidemia, unspecified: Secondary | ICD-10-CM | POA: Diagnosis not present

## 2016-09-20 DIAGNOSIS — N183 Chronic kidney disease, stage 3 (moderate): Secondary | ICD-10-CM | POA: Diagnosis not present

## 2016-09-20 DIAGNOSIS — K922 Gastrointestinal hemorrhage, unspecified: Secondary | ICD-10-CM | POA: Diagnosis not present

## 2016-09-20 DIAGNOSIS — J449 Chronic obstructive pulmonary disease, unspecified: Secondary | ICD-10-CM | POA: Diagnosis not present

## 2016-09-20 DIAGNOSIS — K219 Gastro-esophageal reflux disease without esophagitis: Secondary | ICD-10-CM | POA: Diagnosis not present

## 2016-09-20 DIAGNOSIS — I129 Hypertensive chronic kidney disease with stage 1 through stage 4 chronic kidney disease, or unspecified chronic kidney disease: Secondary | ICD-10-CM | POA: Diagnosis not present

## 2016-09-20 DIAGNOSIS — D631 Anemia in chronic kidney disease: Secondary | ICD-10-CM | POA: Diagnosis not present

## 2016-09-27 DIAGNOSIS — K219 Gastro-esophageal reflux disease without esophagitis: Secondary | ICD-10-CM | POA: Diagnosis not present

## 2016-09-27 DIAGNOSIS — J449 Chronic obstructive pulmonary disease, unspecified: Secondary | ICD-10-CM | POA: Diagnosis not present

## 2016-09-27 DIAGNOSIS — F17210 Nicotine dependence, cigarettes, uncomplicated: Secondary | ICD-10-CM | POA: Diagnosis not present

## 2016-09-27 DIAGNOSIS — N183 Chronic kidney disease, stage 3 (moderate): Secondary | ICD-10-CM | POA: Diagnosis not present

## 2016-09-27 DIAGNOSIS — D631 Anemia in chronic kidney disease: Secondary | ICD-10-CM | POA: Diagnosis not present

## 2016-09-27 DIAGNOSIS — I129 Hypertensive chronic kidney disease with stage 1 through stage 4 chronic kidney disease, or unspecified chronic kidney disease: Secondary | ICD-10-CM | POA: Diagnosis not present

## 2016-09-27 DIAGNOSIS — E785 Hyperlipidemia, unspecified: Secondary | ICD-10-CM | POA: Diagnosis not present

## 2016-09-27 DIAGNOSIS — K922 Gastrointestinal hemorrhage, unspecified: Secondary | ICD-10-CM | POA: Diagnosis not present

## 2016-09-27 DIAGNOSIS — E1122 Type 2 diabetes mellitus with diabetic chronic kidney disease: Secondary | ICD-10-CM | POA: Diagnosis not present

## 2016-10-04 DIAGNOSIS — K922 Gastrointestinal hemorrhage, unspecified: Secondary | ICD-10-CM | POA: Diagnosis not present

## 2016-10-04 DIAGNOSIS — I129 Hypertensive chronic kidney disease with stage 1 through stage 4 chronic kidney disease, or unspecified chronic kidney disease: Secondary | ICD-10-CM | POA: Diagnosis not present

## 2016-10-04 DIAGNOSIS — K219 Gastro-esophageal reflux disease without esophagitis: Secondary | ICD-10-CM | POA: Diagnosis not present

## 2016-10-04 DIAGNOSIS — N183 Chronic kidney disease, stage 3 (moderate): Secondary | ICD-10-CM | POA: Diagnosis not present

## 2016-10-04 DIAGNOSIS — J449 Chronic obstructive pulmonary disease, unspecified: Secondary | ICD-10-CM | POA: Diagnosis not present

## 2016-10-04 DIAGNOSIS — F17210 Nicotine dependence, cigarettes, uncomplicated: Secondary | ICD-10-CM | POA: Diagnosis not present

## 2016-10-04 DIAGNOSIS — E785 Hyperlipidemia, unspecified: Secondary | ICD-10-CM | POA: Diagnosis not present

## 2016-10-04 DIAGNOSIS — D631 Anemia in chronic kidney disease: Secondary | ICD-10-CM | POA: Diagnosis not present

## 2016-10-04 DIAGNOSIS — E1122 Type 2 diabetes mellitus with diabetic chronic kidney disease: Secondary | ICD-10-CM | POA: Diagnosis not present

## 2016-10-06 ENCOUNTER — Encounter: Payer: Self-pay | Admitting: Family Medicine

## 2016-10-09 ENCOUNTER — Other Ambulatory Visit: Payer: Self-pay | Admitting: Family Medicine

## 2016-10-09 ENCOUNTER — Other Ambulatory Visit: Payer: Self-pay | Admitting: Cardiovascular Disease

## 2016-11-06 ENCOUNTER — Ambulatory Visit (INDEPENDENT_AMBULATORY_CARE_PROVIDER_SITE_OTHER): Payer: Medicare HMO | Admitting: Nurse Practitioner

## 2016-11-06 ENCOUNTER — Other Ambulatory Visit: Payer: Self-pay | Admitting: Nurse Practitioner

## 2016-11-06 ENCOUNTER — Encounter: Payer: Self-pay | Admitting: Nurse Practitioner

## 2016-11-06 VITALS — BP 133/77 | HR 66 | Temp 97.9°F | Ht 67.0 in | Wt 140.8 lb

## 2016-11-06 DIAGNOSIS — R531 Weakness: Secondary | ICD-10-CM

## 2016-11-06 DIAGNOSIS — D649 Anemia, unspecified: Secondary | ICD-10-CM

## 2016-11-06 LAB — CBC WITH DIFFERENTIAL/PLATELET
BASOS ABS: 0 {cells}/uL (ref 0–200)
Basophils Relative: 0 %
EOS ABS: 64 {cells}/uL (ref 15–500)
Eosinophils Relative: 1 %
HCT: 34.9 % — ABNORMAL LOW (ref 35.0–45.0)
Hemoglobin: 11.1 g/dL — ABNORMAL LOW (ref 11.7–15.5)
LYMPHS PCT: 30 %
Lymphs Abs: 1920 cells/uL (ref 850–3900)
MCH: 29.5 pg (ref 27.0–33.0)
MCHC: 31.8 g/dL — AB (ref 32.0–36.0)
MCV: 92.8 fL (ref 80.0–100.0)
MONOS PCT: 9 %
MPV: 9.3 fL (ref 7.5–12.5)
Monocytes Absolute: 576 cells/uL (ref 200–950)
NEUTROS ABS: 3840 {cells}/uL (ref 1500–7800)
Neutrophils Relative %: 60 %
PLATELETS: 211 10*3/uL (ref 140–400)
RBC: 3.76 MIL/uL — ABNORMAL LOW (ref 3.80–5.10)
RDW: 14.1 % (ref 11.0–15.0)
WBC: 6.4 10*3/uL (ref 3.8–10.8)

## 2016-11-06 LAB — IRON AND TIBC
%SAT: 22 % (ref 11–50)
Iron: 51 ug/dL (ref 45–160)
TIBC: 233 ug/dL — AB (ref 250–450)
UIBC: 182 ug/dL

## 2016-11-06 LAB — FERRITIN: FERRITIN: 50 ng/mL (ref 20–288)

## 2016-11-06 NOTE — Assessment & Plan Note (Signed)
Plans of worsening generalized weakness. This could be due to a drop in her hemoglobin or because she is only eating 1 meal a day with 2 Glucerna as well. States she just does not have an appetite. No nausea or abdominal pain with eating. She is encouraged to "eat to live" in order to help gain her strength back. Further investigation for worsening anemia as per above. Return for follow-up in 2 months, or sooner based on symptom progression.

## 2016-11-06 NOTE — Assessment & Plan Note (Signed)
The patient is having worsening symptomatic anemia. Her last hemoglobin was much improved at her last visit. Since then she started having more weakness, denies any further bleeding that she is noted. I'll have her complete an iFOBT check for blood that she is not aware of. I'll also check a CBC stat as well as iron studies. She may require a referral to hematology/oncology if she has had another drop in her hemoglobin. Return for follow-up in 2 months, or sooner based on symptom progression.

## 2016-11-06 NOTE — Patient Instructions (Signed)
1. Try to eat more than 1 meal a day, even though your not hungry. This is called "eating to live" and may help your weakness. 2. Have your labs drawn when you leave the office today. 3. We will give you an iFOBT test to complete at home on her stools. Bring this back when you're able to, to our office. 4. We may refer you to a hematologist at [facility] to follow your anemia depending on her lab results. 5. Return for follow-up in 2 months. 6. Call us of any severe or worsening symptoms.

## 2016-11-06 NOTE — Progress Notes (Signed)
Referring Provider: Alycia Rossetti, MD Primary Care Physician:  Alycia Rossetti, MD Primary GI:  Dr. Oneida Alar  Chief Complaint  Patient presents with  . Anemia    f/u  . Weakness    HPI:   Charlotte Henry is a 76 y.o. female who presents For follow-up on anemia and weakness. The patient was last seen in our office 09/05/2016 for the same. 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 At her last visit she felt she was doing better overall, getting her energy back but not a strong but "I'm getting there." No other bleeding noted. No symptoms of overt of anemia.  Her CBC was completed at her last office visit which found her hemoglobin improving to 10.6 from 9.5 at discharge.  Today she states she's not feeling good. Feels weaker now than she did in April. Denies any further overt GI bleeding including hematochezia or melena. She's also having significant aches and pains in her joints. Denies abdominal pain, N/V. States her appetite is decreased and only eats one meal a day and has had about a 7 pound weight loss. Drinks Glucerna 1-2 a day. Has a bowel movement 1-2 times daily without significant changes in character, consistency, and frequency. Denies chest pain, dyspnea, dizziness, lightheadedness, syncope, near syncope. Denies any other upper or lower GI symptoms.  Today she remains on po iron 325 daily. She does still smoke 0.5 ppd.  Past Medical History:  Diagnosis Date  . 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 ASCVD (arteriosclerotic cardiovascular disease)    a. cath in 4/04,-80%LAD; 70% RCA, -> DESx2; residual 60% distal RCA; nl EF  b. Nuc 02/2016 aborted due to bradycardia. Will need to be rescheduled   . Carotid artery occlusion   . Cerebrovascular disease   . CHB (complete heart block) ([place])    a. s/p PPM on  03/24/16 with a St. Jude (serial number [id number]) pacemaker  . Chronic kidney disease (CKD), stage III (moderate)   . Clostridium difficile colitis 03/2013   a. 03/2013  . COPD (chronic obstructive pulmonary disease) ([place])   . Coronary disease   . Diabetes mellitus   . Diverticulosis   . GERD (gastroesophageal reflux disease)   . Gout   . H pylori ulcer   . Hyperlipidemia    chose to stop statin, takes Zetia  . Hypertension    not currently on medication  . IBS (irritable bowel syndrome)   . PVD (peripheral vascular disease) ([place])    a. moderate to severely decreased ABI's in 8/08, sig. aortic inflow disease b. repeat ABI's in 2011 improved- mild disease on the left and moderate to severe disease on the right  . S/P placement of cardiac pacemaker    a. 03/24/16: St. Jude (serial number [id number]) pacemaker  . TIA (transient ischemic attack) 2012  . Tobacco abuse    a. 50 pack years continuing at 1/2 pack per day    Past Surgical History:  Procedure Laterality Date  . ABDOMINAL HYSTERECTOMY    . carpel tunnel release     bilateral  . CHOLECYSTECTOMY    . COLONOSCOPY  2009  . COLONOSCOPY N/A 08/04/2016   incomplete due to prep.   . COLONOSCOPY N/A 08/05/2016   Dr. Oneida Alar: moderately redundant rectosigmoid and sigmoid, normal TI, single large-mouthed diverticulum in hepatic flexure, external  hemorrhoids, path negative for microscopic colitis   . ENTEROSCOPY N/A 08/18/2016   Procedure: ENTEROSCOPY;  Surgeon: Daneil Dolin, MD;  Location: [facility];  Service: Endoscopy;  Laterality: N/A;  Pediatric colonoscope  . EP IMPLANTABLE DEVICE N/A 03/24/2016   Procedure: Pacemaker Implant;  Surgeon: [name], MD;  Location: [place] [facility];  Service: Cardiovascular;  Laterality: N/A;  . ESOPHAGOGASTRODUODENOSCOPY N/A 08/04/2016   Dr. Oneida Alar: non-critical Schatzki's ring, small hiatal hernia, +H.pylori gastritis on path.   Marland Kitchen GIVENS CAPSULE STUDY N/A 08/16/2016   Procedure: GIVENS  CAPSULE STUDY;  Surgeon: Daneil Dolin, MD;  Location: [facility];  Service: Endoscopy;  Laterality: N/A;  . TOTAL ABDOMINAL HYSTERECTOMY W/ BILATERAL SALPINGOOPHORECTOMY      Current Outpatient Prescriptions  Medication Sig Dispense Refill  . acetaminophen (TYLENOL) 500 MG tablet Take 500 mg by mouth every 6 (six) hours as needed for mild pain or moderate pain.    Marland Kitchen albuterol (PROVENTIL HFA;VENTOLIN HFA) 108 (90 Base) MCG/ACT inhaler Inhale 2 puffs into the lungs every 4 (four) hours as needed for wheezing or shortness of breath. 1 Inhaler 2  . ALPRAZolam (XANAX) 0.25 MG tablet Take 1 tablet (0.25 mg total) by mouth at bedtime as needed. 30 tablet 1  . calcium citrate-vitamin D (CITRACAL+D) 315-200 MG-UNIT per tablet Take 1 tablet by mouth daily.      . Cranberry 250 MG CAPS Take 250 mg by mouth daily.    . Cyanocobalamin (VITAMIN B 12 PO) Take 1 tablet by mouth daily.     Marland Kitchen esomeprazole (NEXIUM) 40 MG capsule Take 1 capsule (40 mg total) by mouth 2 (two) times daily before a meal. (Patient taking differently: Take 40 mg by mouth daily. ) 60 capsule 0  . ezetimibe (ZETIA) 10 MG tablet TAKE 1 TABLET BY MOUTH ONCE A DAY. 30 tablet 3  . ferrous sulfate 325 (65 FE) MG tablet Take 325 mg by mouth daily with breakfast.    . Fluticasone-Salmeterol (ADVAIR) 100-50 MCG/DOSE AEPB Inhale 1 puff into the lungs 2 (two) times daily. (Patient taking differently: Inhale 1 puff into the lungs as needed. ) 1 each 3  . gabapentin (NEURONTIN) 100 MG capsule TAKE 1 TO 2 CAPSULES BY MOUTH AT BEDTIME FOR NERVE PAIN. 60 capsule 0  . lubiprostone (AMITIZA) 8 MCG capsule Take 8 mcg by mouth as needed.     . mirabegron ER (MYRBETRIQ) 25 MG TB24 tablet Take 1 tablet (25 mg total) by mouth at bedtime. For bladder (Patient taking differently: Take 25 mg by mouth as needed. For bladder) 30 tablet 6  . Multiple Vitamin (MULTIVITAMIN WITH MINERALS) TABS tablet Take 1 tablet by mouth daily.    . Probiotic Product (PROBIOTIC  DAILY PO) Take 1 capsule by mouth daily.     . promethazine (PHENERGAN) 12.5 MG tablet 1-2 po 30 MINS PRIOR TO MEDS OR MEALS. MAY REPEAT EVERY 4-6 h prn nausea or vomiting 40 tablet 1  . Tiotropium Bromide Monohydrate (SPIRIVA RESPIMAT) 1.25 MCG/ACT AERS Inhale 2 puffs into the lungs daily. (Patient taking differently: Inhale 2 puffs into the lungs as needed. ) 1 Inhaler 11   No current facility-administered medications for this visit.     Allergies as of 11/06/2016 - Review Complete 11/06/2016  Allergen Reaction Noted  . Ace inhibitors Hives, Shortness Of Breath, and Swelling 10/27/2008  . Aspirin Other (See Comments) 10/27/2008  . Codeine Swelling 02/02/2011  . Pineapple Shortness Of Breath and Swelling 09/03/2013  .  Plasticized base [plastibase] Hives and Swelling 02/07/2012  . Ultram [tramadol] Swelling 03/21/2012  . Adhesive [tape] Swelling and Rash 02/07/2012  . Naprosyn [naproxen] Swelling 02/02/2011  . Biaxin [clarithromycin] Nausea And Vomiting and Other (See Comments) 07/04/2016  . Clindamycin/lincomycin Other (See Comments) 05/04/2014  . Linzess [linaclotide]  02/26/2013  . Metronidazole  06/29/2016  . Nsaids  03/04/2012  . Penicillins  05/04/2014  . Varenicline tartrate Swelling 10/27/2008    Family History  Problem Relation Age of Onset  . Liver disease Mother 68  . Hypertension Mother   . Cerebral aneurysm Father   . Aneurysm Father 94  . Cancer Sister   . Liver disease Brother   . Liver disease Brother   . Liver disease Brother   . Diabetes type II Brother   . Alcohol abuse Brother   . Cancer Sister   . Colon cancer Neg Hx     Social History   Social History  . Marital status: Widowed    Spouse name: N/A  . Number of children: N/A  . Years of education: N/A   Social History Main Topics  . Smoking status: Current Every Day Smoker    Packs/day: 0.50    Years: 50.00    Types: Cigarettes    Start date: 02/02/1966  . Smokeless tobacco: Never Used      Comment: 1/2 pack daily  . Alcohol use No  . Drug use: No  . Sexual activity: Not Currently   Other Topics Concern  . None   Social History Narrative  . None    Review of Systems: Complete ROS negative except as per HPI.   Physical Exam: BP 133/77   Pulse 66   Temp 97.9 F (36.6 C) (Oral)   Ht 5\' 7"  (1.702 m)   Wt 140 lb 12.8 oz (63.9 kg)   BMI 22.05 kg/m  General:   Alert and oriented. Pleasant and cooperative. Well-nourished and well-developed. Appears fatigued. Eyes:  Without icterus, sclera clear and conjunctiva pink.  Ears:  Normal auditory acuity. Cardiovascular:  S1, S2 present without murmurs appreciated. Extremities without clubbing or edema. Respiratory:  Clear to auscultation bilaterally. No wheezes, rales, or rhonchi. No distress.  Gastrointestinal:  +BS, soft, non-tender and non-distended. No HSM noted. No guarding or rebound. No masses appreciated.  Rectal:  Deferred  Musculoskalatal:  Symmetrical without gross deformities. Neurologic:  Alert and oriented x4;  grossly normal neurologically. Psych:  Alert and cooperative. Normal mood and affect. Heme/Lymph/Immune: No excessive bruising noted.    11/06/2016 8:36 AM   Disclaimer: This note was dictated with voice recognition software. Similar sounding words can inadvertently be transcribed and may not be corrected upon review.

## 2016-11-06 NOTE — Progress Notes (Signed)
cc'ed to pcp °

## 2016-11-07 ENCOUNTER — Ambulatory Visit (INDEPENDENT_AMBULATORY_CARE_PROVIDER_SITE_OTHER): Payer: Medicare HMO

## 2016-11-07 DIAGNOSIS — D509 Iron deficiency anemia, unspecified: Secondary | ICD-10-CM | POA: Diagnosis not present

## 2016-11-07 LAB — IRON

## 2016-11-07 LAB — IBC PANEL

## 2016-11-07 LAB — FERRITIN

## 2016-11-07 LAB — IFOBT (OCCULT BLOOD): IMMUNOLOGICAL FECAL OCCULT BLOOD TEST: NEGATIVE

## 2016-11-08 ENCOUNTER — Other Ambulatory Visit: Payer: Self-pay

## 2016-11-08 DIAGNOSIS — E611 Iron deficiency: Secondary | ICD-10-CM

## 2016-11-08 NOTE — Progress Notes (Signed)
Noted  

## 2016-11-08 NOTE — Progress Notes (Signed)
PT is aware and OK to refer to Hematology.

## 2016-11-10 ENCOUNTER — Telehealth: Payer: Self-pay | Admitting: Gastroenterology

## 2016-11-10 NOTE — Telephone Encounter (Signed)
Pt called saying she was supposed to be referred to "somewhere about her iron" and said that had not received a referral from [place]. She said that she spoke to someone here this morning. Pt was seen here on Monday for her anemia. Please advise and call her at [phone]

## 2016-11-10 NOTE — Telephone Encounter (Signed)
Talked with her and gave her the number to see if she could get an appointment faster at the [facility].

## 2016-11-16 ENCOUNTER — Other Ambulatory Visit: Payer: Self-pay

## 2016-11-16 ENCOUNTER — Telehealth: Payer: Self-pay | Admitting: Gastroenterology

## 2016-11-16 NOTE — Telephone Encounter (Signed)
Pt called again today saying that she hasn't heard from the [facility]. She spoke with GF last week and was given the number to call them about getting an appointment sooner. She still wants to speak to GF. I told her GF was with another patient and I would let her know that she had called. [phone]

## 2016-11-16 NOTE — Telephone Encounter (Signed)
Talked with patient and she said that the cancer center has already call her

## 2016-11-22 ENCOUNTER — Ambulatory Visit (INDEPENDENT_AMBULATORY_CARE_PROVIDER_SITE_OTHER): Payer: Medicare HMO | Admitting: *Deleted

## 2016-11-22 DIAGNOSIS — I442 Atrioventricular block, complete: Secondary | ICD-10-CM | POA: Diagnosis not present

## 2016-11-23 NOTE — Progress Notes (Signed)
Remote pacemaker transmission.   

## 2016-11-24 ENCOUNTER — Encounter: Payer: Self-pay | Admitting: Cardiology

## 2016-11-28 ENCOUNTER — Encounter: Payer: Self-pay | Admitting: Gastroenterology

## 2016-12-06 LAB — CUP PACEART REMOTE DEVICE CHECK
Battery Remaining Percentage: 95.5 %
Brady Statistic AP VS Percent: 1 %
Brady Statistic AS VP Percent: 58 %
Brady Statistic AS VS Percent: 1 %
Brady Statistic RA Percent Paced: 41 %
Brady Statistic RV Percent Paced: 99 %
Date Time Interrogation Session: 20180620070757
Implantable Lead Implant Date: 20171020
Implantable Lead Location: 753859
Lead Channel Pacing Threshold Amplitude: 0.5 V
Lead Channel Sensing Intrinsic Amplitude: 12 mV
Lead Channel Setting Pacing Amplitude: 2 V
Lead Channel Setting Pacing Pulse Width: 0.4 ms
Lead Channel Setting Sensing Sensitivity: 3.5 mV
MDC IDC LEAD IMPLANT DT: 20171020
MDC IDC LEAD LOCATION: 753860
MDC IDC MSMT BATTERY REMAINING LONGEVITY: 108 mo
MDC IDC MSMT BATTERY VOLTAGE: 2.99 V
MDC IDC MSMT LEADCHNL RA IMPEDANCE VALUE: 440 Ohm
MDC IDC MSMT LEADCHNL RA PACING THRESHOLD AMPLITUDE: 0.5 V
MDC IDC MSMT LEADCHNL RA PACING THRESHOLD PULSEWIDTH: 0.4 ms
MDC IDC MSMT LEADCHNL RA SENSING INTR AMPL: 2.9 mV
MDC IDC MSMT LEADCHNL RV IMPEDANCE VALUE: 550 Ohm
MDC IDC MSMT LEADCHNL RV PACING THRESHOLD PULSEWIDTH: 0.4 ms
MDC IDC PG IMPLANT DT: 20171020
MDC IDC SET LEADCHNL RV PACING AMPLITUDE: 2.5 V
MDC IDC STAT BRADY AP VP PERCENT: 41 %
Pulse Gen Model: 2272
Pulse Gen Serial Number: 3180497

## 2016-12-07 ENCOUNTER — Encounter (HOSPITAL_COMMUNITY): Payer: Medicare HMO

## 2016-12-07 ENCOUNTER — Encounter (HOSPITAL_COMMUNITY): Payer: Medicare HMO | Attending: Oncology | Admitting: Oncology

## 2016-12-07 ENCOUNTER — Encounter (HOSPITAL_COMMUNITY): Payer: Self-pay | Admitting: Oncology

## 2016-12-07 VITALS — BP 130/71 | HR 60 | Resp 16 | Ht 67.0 in | Wt 142.0 lb

## 2016-12-07 DIAGNOSIS — N183 Chronic kidney disease, stage 3 unspecified: Secondary | ICD-10-CM

## 2016-12-07 DIAGNOSIS — D649 Anemia, unspecified: Secondary | ICD-10-CM

## 2016-12-07 DIAGNOSIS — F17200 Nicotine dependence, unspecified, uncomplicated: Secondary | ICD-10-CM

## 2016-12-07 LAB — IRON AND TIBC
Iron: 48 ug/dL (ref 28–170)
SATURATION RATIOS: 21 % (ref 10.4–31.8)
TIBC: 225 ug/dL — ABNORMAL LOW (ref 250–450)
UIBC: 177 ug/dL

## 2016-12-07 LAB — RETICULOCYTES
RBC.: 3.84 MIL/uL — ABNORMAL LOW (ref 3.87–5.11)
RETIC CT PCT: 1.3 % (ref 0.4–3.1)
Retic Count, Absolute: 49.9 10*3/uL (ref 19.0–186.0)

## 2016-12-07 LAB — CBC WITH DIFFERENTIAL/PLATELET
BASOS ABS: 0 10*3/uL (ref 0.0–0.1)
BASOS PCT: 0 %
EOS PCT: 1 %
Eosinophils Absolute: 0.1 10*3/uL (ref 0.0–0.7)
HCT: 33.6 % — ABNORMAL LOW (ref 36.0–46.0)
Hemoglobin: 11.5 g/dL — ABNORMAL LOW (ref 12.0–15.0)
Lymphocytes Relative: 31 %
Lymphs Abs: 2 10*3/uL (ref 0.7–4.0)
MCH: 29.9 pg (ref 26.0–34.0)
MCHC: 34.2 g/dL (ref 30.0–36.0)
MCV: 87.5 fL (ref 78.0–100.0)
MONO ABS: 0.5 10*3/uL (ref 0.1–1.0)
Monocytes Relative: 7 %
Neutro Abs: 3.9 10*3/uL (ref 1.7–7.7)
Neutrophils Relative %: 61 %
PLATELETS: 186 10*3/uL (ref 150–400)
RBC: 3.84 MIL/uL — AB (ref 3.87–5.11)
RDW: 14 % (ref 11.5–15.5)
WBC: 6.4 10*3/uL (ref 4.0–10.5)

## 2016-12-07 LAB — COMPREHENSIVE METABOLIC PANEL
ALBUMIN: 3.8 g/dL (ref 3.5–5.0)
ALT: 9 U/L — AB (ref 14–54)
AST: 20 U/L (ref 15–41)
Alkaline Phosphatase: 55 U/L (ref 38–126)
Anion gap: 6 (ref 5–15)
BUN: 18 mg/dL (ref 6–20)
CHLORIDE: 104 mmol/L (ref 101–111)
CO2: 28 mmol/L (ref 22–32)
Calcium: 9.7 mg/dL (ref 8.9–10.3)
Creatinine, Ser: 0.94 mg/dL (ref 0.44–1.00)
GFR calc Af Amer: >60 mL/min (ref >60)
GFR calc non Af Amer: 57 mL/min — ABNORMAL LOW (ref >60)
GLUCOSE: 101 mg/dL — AB (ref 65–99)
POTASSIUM: 3.9 mmol/L (ref 3.5–5.1)
SODIUM: 138 mmol/L (ref 135–145)
Total Bilirubin: 0.6 mg/dL (ref 0.3–1.2)
Total Protein: 6.9 g/dL (ref 6.5–8.1)

## 2016-12-07 LAB — VITAMIN B12: VITAMIN B 12: 2814 pg/mL — AB (ref 180–914)

## 2016-12-07 LAB — FERRITIN: FERRITIN: 55 ng/mL (ref 11–307)

## 2016-12-07 LAB — FOLATE: Folate: 16 ng/mL (ref >5.9)

## 2016-12-07 LAB — LACTATE DEHYDROGENASE: LDH: 146 U/L (ref 98–192)

## 2016-12-07 NOTE — Assessment & Plan Note (Signed)
Ongoing tobacco abuse.  75 year pack history.  Current smoker, down to 0.5 ppd.  History of 1.5 ppd.  She qualifies for lung cancer screening and this will need to be discussed in the future.  She is asymptomatic for lung cancer.  Smoking cessation education provided.

## 2016-12-07 NOTE — Progress Notes (Signed)
[facility] Hematology/Oncology Consultation   Name: SHARELLE BURDITT      MRN: [id number]    Location: Room/bed info not found  Date: 12/07/2016 Time:12:17 PM   REFERRING PHYSICIAN:  Walden Field, NP (GI)  REASON FOR CONSULT:  Iron deficiency anemia   DIAGNOSIS:  Mild/moderate normocytic, normochromic anemia with preservation of WBC and platelet count, on ferrous sulfate.  HISTORY OF PRESENT ILLNESS:   Charlotte Henry is a 76 y.o. female with a medical history significant for asthma, back pain, carotid stenosis, complete heart block, chronic renal disease stage II, COPD, DM, essential HTN, GERD, H/O H. Pylori gastritis, hyperlipidemia, IBS, insomnia, osteoarthritis, overactive bladder, peripheral vascular disease, tobacco abuse who is referred to the [facility] for low iron.  GI work-up includes small bowel enteroscopy on 08/18/2016 by Dr. Gala Romney secondary to GI bleeding documented on video capsule endoscopy.  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  Camera capsule study on 08/17/2016 demonstrated "copious blood in much of the small bowel.  Colonoscopy by Dr. Oneida Alar on 08/05/2016 demonstrating fair prep, redundant left colon, single diverticulosis at the hepatic flexure, and external hemorrhoids.  EGD by Dr. Oneida Alar on 08/04/2016 demonstrated a non-obstructing Schatzki ring, hiatal hernia, and gastritis.  She was admitted to the hospital 08/15/2016 for symptomatic anemia of 5.4 g/dL and positive FOBT.   She was discharged on 08/20/2016 and during hospitalization, she received 4 units PRBCS.  Previously, she was admitted to the hospital with symptomatic anemia (08/02/2016- 08/05/2016).  It was during this hospitalization that EGD/Colonoscopy was performed, but colonoscopy was aborted due to poor preparation.  During this hospitalization she needed 5 units of PRBCs.  She was diagnosed with H. Pylori infection, but patient was intolerant to  initial treatment.  She reports fatigue and weakness.  She notes chronic numbness/tingling of her feet bilaterally secondary to long-standing DM.  She admits to a history of Pica/Pagophagia with eating of dirt and starch in the past.  She denies this symptom at this time.  She denies ice cravings.  She notes "I'm slow."  She reports an appetite of 25% and energy level of 25%.  She denies any pain.  She admits to weight loss. She is down about 10 lbs x 1 year.  She is taking Glucerna to supplement her diet.  She is on ferrous sulfate 325 mg PO daily.  She denies any issues associated with PO iron.    Review of Systems  Constitutional: Positive for malaise/fatigue. Negative for chills, fever and weight loss.  HENT: Negative.   Eyes: Negative.   Respiratory: Negative.  Negative for cough.   Cardiovascular: Negative.  Negative for chest pain.  Gastrointestinal: Positive for constipation. Negative for blood in stool, diarrhea, melena, nausea and vomiting.  Genitourinary: Negative.   Musculoskeletal: Negative.   Skin: Negative.   Neurological: Positive for sensory change (numbness/burning in feet B/L). Negative for weakness.  Endo/Heme/Allergies: Negative.   Psychiatric/Behavioral: Negative.      PAST MEDICAL HISTORY:   Past Medical History:  Diagnosis Date  . Arterial fibromuscular dysplasia ([place])   . ASCVD (arteriosclerotic cardiovascular disease)    a. cath in 4/04,-80%LAD; 70% RCA, -> DESx2; residual 60% distal RCA; nl EF  b. Nuc 02/2016 aborted due to bradycardia. Will need to be rescheduled   . Carotid artery occlusion   . Cerebrovascular disease   . CHB (complete heart block) (HCC)    a. s/p PPM  on 03/24/16 with a St. Jude (serial number [id number]) pacemaker  . Chronic kidney disease (CKD), stage III (moderate)   . Clostridium difficile colitis 03/2013   a. 03/2013  . COPD (chronic obstructive pulmonary disease) ([place])   . Coronary disease   . Diabetes mellitus   . Diverticulosis     . GERD (gastroesophageal reflux disease)   . Gout   . H pylori ulcer   . Hyperlipidemia    chose to stop statin, takes Zetia  . Hypertension    not currently on medication  . IBS (irritable bowel syndrome)   . PVD (peripheral vascular disease) ([place])    a. moderate to severely decreased ABI's in 8/08, sig. aortic inflow disease b. repeat ABI's in 2011 improved- mild disease on the left and moderate to severe disease on the right  . S/P placement of cardiac pacemaker    a. 03/24/16: St. Jude (serial number [id number]) pacemaker  . Symptomatic anemia 08/15/2016  . TIA (transient ischemic attack) 2012  . Tobacco abuse    a. 50 pack years continuing at 1/2 pack per day  . Tobacco use disorder 10/15/2015    ALLERGIES: Allergies  Allergen Reactions  . Ace Inhibitors Hives, Shortness Of Breath and Swelling  . Aspirin Other (See Comments)    Blood in stool and nose bleed  . Codeine Swelling  . Pineapple Shortness Of Breath and Swelling  . Plasticized Base [Plastibase] Hives and Swelling    No plastics  . Ultram [Tramadol] Swelling  . Adhesive [Tape] Swelling and Rash  . Naprosyn [Naproxen] Swelling  . Biaxin [Clarithromycin] Nausea And Vomiting and Other (See Comments)    Dizziness.  . Clindamycin/Lincomycin Other (See Comments)    Burning sensation throat, abdomen  . Linzess [Linaclotide]     DIARRHEA, FECAL INCONTINENCE  . Metronidazole     NAUSEA AND WEAKNESS  . Nsaids   . Penicillins     Lip swelling, Hives Has patient had a PCN reaction causing immediate rash, facial/tongue/throat swelling, SOB or lightheadedness with hypotension:YES Has patient had a PCN reaction causing severe rash involving mucus membranes or skin necrosis: NO Has patient had a PCN reaction that required hospitalization NO Has patient had a PCN reaction occurring within the last 10 years: NO If all of the above answers are "NO", then may proceed with Cephalosporin use.  . Varenicline Tartrate Swelling       MEDICATIONS: I have reviewed the patient's current medications.    Current Outpatient Prescriptions on File Prior to Visit  Medication Sig Dispense Refill  . acetaminophen (TYLENOL) 500 MG tablet Take 500 mg by mouth every 6 (six) hours as needed for mild pain or moderate pain.    Marland Kitchen albuterol (PROVENTIL HFA;VENTOLIN HFA) 108 (90 Base) MCG/ACT inhaler Inhale 2 puffs into the lungs every 4 (four) hours as needed for wheezing or shortness of breath. 1 Inhaler 2  . ALPRAZolam (XANAX) 0.25 MG tablet Take 1 tablet (0.25 mg total) by mouth at bedtime as needed. 30 tablet 1  . calcium citrate-vitamin D (CITRACAL+D) 315-200 MG-UNIT per tablet Take 1 tablet by mouth daily.      . Cranberry 250 MG CAPS Take 250 mg by mouth daily.    . Cyanocobalamin (VITAMIN B 12 PO) Take 1 tablet by mouth daily.     Marland Kitchen esomeprazole (NEXIUM) 40 MG capsule Take 1 capsule (40 mg total) by mouth 2 (two) times daily before a meal. (Patient taking differently: Take 40 mg by mouth daily. ) 60  capsule 0  . ezetimibe (ZETIA) 10 MG tablet TAKE 1 TABLET BY MOUTH ONCE A DAY. 30 tablet 3  . ferrous sulfate 325 (65 FE) MG tablet Take 325 mg by mouth daily with breakfast.    . Fluticasone-Salmeterol (ADVAIR) 100-50 MCG/DOSE AEPB Inhale 1 puff into the lungs 2 (two) times daily. (Patient taking differently: Inhale 1 puff into the lungs as needed. ) 1 each 3  . gabapentin (NEURONTIN) 100 MG capsule TAKE 1 TO 2 CAPSULES BY MOUTH AT BEDTIME FOR NERVE PAIN. 60 capsule 0  . lubiprostone (AMITIZA) 8 MCG capsule Take 8 mcg by mouth as needed.     . mirabegron ER (MYRBETRIQ) 25 MG TB24 tablet Take 1 tablet (25 mg total) by mouth at bedtime. For bladder (Patient taking differently: Take 25 mg by mouth as needed. For bladder) 30 tablet 6  . Multiple Vitamin (MULTIVITAMIN WITH MINERALS) TABS tablet Take 1 tablet by mouth daily.    . Probiotic Product (PROBIOTIC DAILY PO) Take 1 capsule by mouth daily.     . promethazine (PHENERGAN) 12.5 MG tablet  1-2 po 30 MINS PRIOR TO MEDS OR MEALS. MAY REPEAT EVERY 4-6 h prn nausea or vomiting 40 tablet 1  . Tiotropium Bromide Monohydrate (SPIRIVA RESPIMAT) 1.25 MCG/ACT AERS Inhale 2 puffs into the lungs daily. (Patient taking differently: Inhale 2 puffs into the lungs as needed. ) 1 Inhaler 11   No current facility-administered medications on file prior to visit.      PAST SURGICAL HISTORY Past Surgical History:  Procedure Laterality Date  . ABDOMINAL HYSTERECTOMY    . carpel tunnel release     bilateral  . CHOLECYSTECTOMY    . COLONOSCOPY  2009  . COLONOSCOPY N/A 08/04/2016   incomplete due to prep.   . COLONOSCOPY N/A 08/05/2016   Dr. Oneida Alar: moderately redundant rectosigmoid and sigmoid, normal TI, single large-mouthed diverticulum in hepatic flexure, external hemorrhoids, path negative for microscopic colitis   . ENTEROSCOPY N/A 08/18/2016   Procedure: ENTEROSCOPY;  Surgeon: Daneil Dolin, MD;  [facility];  Service: Endoscopy;  Laterality: N/A;  Pediatric colonoscope  . EP IMPLANTABLE DEVICE N/A 03/24/2016   Procedure: Pacemaker Implant;  Surgeon: [name], MD;  Location: [place] [facility];  Service: Cardiovascular;  Laterality: N/A;  . ESOPHAGOGASTRODUODENOSCOPY N/A 08/04/2016   Dr. Oneida Alar: non-critical Schatzki's ring, small hiatal hernia, +H.pylori gastritis on path.   Marland Kitchen GIVENS CAPSULE STUDY N/A 08/16/2016   Procedure: GIVENS CAPSULE STUDY;  Surgeon: Daneil Dolin, MD;  Location: [facility];  Service: Endoscopy;  Laterality: N/A;  . TOTAL ABDOMINAL HYSTERECTOMY W/ BILATERAL SALPINGOOPHORECTOMY      FAMILY HISTORY: Family History  Problem Relation Age of Onset  . Liver disease Mother 36  . Hypertension Mother   . Cerebral aneurysm Father   . Aneurysm Father 22  . Cancer Sister   . Liver disease Brother   . Liver disease Brother   . Liver disease Brother   . Diabetes type II Brother   . Alcohol abuse Brother   . Cancer Sister   . Colon cancer Neg Hx    86  yo Son in good health 50-something yo daughter in good health   SOCIAL HISTORY:  reports that she has been smoking Cigarettes.  She started smoking about 50 years ago. She has a 75.00 pack-year smoking history. She has never used smokeless tobacco. She reports that she does not drink alcohol or use drugs.  She reports a history  of EtOH abuse drinking a 6 pack per day, quitting in 2003.  She has been decreasing her cigarette smoking.  She is down to 0.5 ppd.  She is widowed since 56 when her husband passed from a MI.  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  She is [employer] in religion  Social History   Social History  . Marital status: Widowed    Spouse name: N/A  . Number of children: N/A  . Years of education: N/A   Social History Main Topics  . Smoking status: Current Every Day Smoker    Packs/day: 1.50    Years: 50.00    Types: Cigarettes    Start date: 02/02/1966  . Smokeless tobacco: Never Used     Comment: 1/2 pack daily  . Alcohol use No     Comment: H/O of 6 pack per day quiting in 2003  . Drug use: No  . Sexual activity: Not Currently   Other Topics Concern  . None   Social History Narrative  . None    PERFORMANCE STATUS: The patient's performance status is 1 - Symptomatic but completely ambulatory  PHYSICAL EXAM: Most Recent Vital Signs: Blood pressure 130/71, pulse 60, resp. rate 16, height '5\' 7"'  (1.702 m), weight 142 lb (64.4 kg), SpO2 100 %. BP 130/71 (BP Location: Left Arm, Patient Position: Sitting)   Pulse 60   Resp 16   Ht '5\' 7"'  (1.702 m)   Wt 142 lb (64.4 kg)   SpO2 100%   BMI 22.24 kg/m   General Appearance:    Alert, cooperative, no distress, appears stated age, unaccompanied  Head:    Normocephalic, without obvious abnormality, atraumatic  Eyes:    Conjunctiva/corneas clear, EOM's intact, both eyes  Ears:    Normal TM's and external ear canals, both ears  Nose:   Nares normal, septum midline, mucosa  normal, no drainage    or sinus tenderness  Throat:   Lips, mucosa, and tongue normal; dentures in place (upper), partial dentures in lower jaw.  Neck:   Supple, symmetrical, trachea midline, no adenopathy;    Thyroid.  Back:     Symmetric, no curvature, ROM normal, no CVA tenderness  Lungs:     Clear to auscultation bilaterally, respirations unlabored.  Poor patient effort.  Chest Wall:    No tenderness or deformity.  Left sided pacemaker in place.   Heart:    Regular rate and rhythm, S1 and S2 normal, no murmur, rub   or gallop  Breast Exam:    Not examined  Abdomen:     Soft, non-tender, bowel sounds active all four quadrants.  Genitalia:    Not examined  Rectal:    Not examined  Extremities:   Extremities normal, atraumatic, no cyanosis or edema  Pulses:   Not examined  Skin:   Skin color, texture, turgor normal, no rashes or lesions  Lymph nodes:   Not examined  Neurologic:   CNII-XII intact, normal strength, sensation and reflexes    throughout    LABORATORY DATA:  CBC    Component Value Date/Time   WBC 6.4 11/06/2016 0934   RBC 3.76 (L) 11/06/2016 0934   HGB 11.1 (L) 11/06/2016 0934   HCT 34.9 (L) 11/06/2016 0934   PLT 211 11/06/2016 0934   MCV 92.8 11/06/2016 0934   MCH 29.5 11/06/2016 0934   MCHC 31.8 (L) 11/06/2016 0934   RDW 14.1 11/06/2016 0934   LYMPHSABS 1,920  11/06/2016 0934   MONOABS 576 11/06/2016 0934   EOSABS 64 11/06/2016 0934   BASOSABS 0 11/06/2016 0934     Chemistry      Component Value Date/Time   NA 136 08/20/2016 0620   K 3.9 08/20/2016 0620   CL 109 08/20/2016 0620   CO2 23 08/20/2016 0620   BUN 31 (H) 08/20/2016 0620   CREATININE 1.08 (H) 08/20/2016 0620   CREATININE 1.05 (H) 08/09/2016 1439      Component Value Date/Time   CALCIUM 8.4 (L) 08/20/2016 0620   ALKPHOS 31 (L) 08/15/2016 1154   AST 27 08/15/2016 1154   ALT 17 08/15/2016 1154   BILITOT 0.8 08/15/2016 1154     Lab Results  Component Value Date   IRON 51 11/06/2016    TIBC 233 (L) 11/06/2016   FERRITIN 50 11/06/2016   Lab Results  Component Value Date   FOLATE 13.1 08/03/2016   Lab Results  Component Value Date   VITAMINB12 1,167 (H) 08/03/2016   Lab Results  Component Value Date   ESRSEDRATE 36 (H) 06/14/2016   No results found for: CRP   RADIOGRAPHY: No results found.     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 - WARTHIN-STARRY STAIN POSITIVE FOR HELICOBACTER PYLORI. - NO DYSPLASIA OR MALIGNANCY. Claudette Laws MD Pathologist, Electronic Signature (Case signed 08/08/2016)   Diagnosis Colon, biopsy, random - NORMAL COLONIC MUCOSA. - NO MICROSCOPIC COLITIS, ACTIVE INFLAMMATION OR GRANULOMAS. 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  Two hospitalizations in March 2018 requiring 5 units of PRBCs in total for anemia.  Previous labs reviewed in detail.  I personally reviewed and went over laboratory results with the patient.  The results are noted within this dictation.  Normal serum iron with low TIBC.  Iron saturation is normal at 22% as well.  Ferritin normal, but less than 100.  I personally reviewed and went over radiographic studies with the patient.  The results are noted within this dictation.  I personally reviewed the images in PACS.  I personally reviewed and went over pathology results with the patient.  Labs today: CBC diff, CMET, anemia panel, retic count, haptoglobin, EPO, LDH, ESR, CRP, pathology smear review, sample to blood bank.  Due  to anemia, will screen for multiple myeloma: SPEP + IFE, light chain assay, IgG, IgM, IgA.  Based upon her history, suspect iron deficiency anemia.  Based upon labs today, will calculate iron deficit and consider IV iron replacement therapy.  Labs in 6 weeks: CBC diff, iron/TIBC, ferritin.  Return in 6 weeks for follow-up.  Chronic kidney disease (CKD), stage III (moderate) H/O renal disease.    Labs today: CMET, EPO.  Tobacco use disorder Ongoing tobacco abuse.  75 year pack history.  Current smoker, down to 0.5 ppd.  History of 1.5 ppd.  She qualifies for lung cancer screening and this will need to be discussed in the future.  She is asymptomatic for lung cancer.  Smoking cessation education provided.   ORDERS PLACED FOR THIS ENCOUNTER: Orders Placed This Encounter  Procedures  .  CBC with Differential  . Comprehensive metabolic panel  . Pathologist smear review  . Vitamin B12  . Folate  . Iron and TIBC  . Ferritin  . Erythropoietin  . Haptoglobin  . Lactate dehydrogenase  . Reticulocytes  . Protein electrophoresis, serum  . Kappa/lambda light chains  . IgG, IgA, IgM  . Immunofixation electrophoresis  . Sample to Blood Bank    MEDICATIONS PRESCRIBED THIS ENCOUNTER: No orders of the defined types were placed in this encounter.   All questions were answered. The patient knows to call the clinic with any problems, questions or concerns. We can certainly see the patient much sooner if necessary.  Patient discussed with Dr. Talbert Cage and together we ascertained an up-to-date interval history, and examined the patient.  Dr. Talbert Cage developed the patient's assessment and plan.  This was a shared visit-consultation.  Her attestation will follow below.  This note is electronically signed by: Doy Mince 12/07/2016 12:17 PM

## 2016-12-07 NOTE — Patient Instructions (Addendum)
[place] at [facility] Discharge Instructions  RECOMMENDATIONS MADE BY THE CONSULTANT AND ANY TEST RESULTS WILL BE SENT TO YOUR REFERRING PHYSICIAN.  You were seen today by Kirby Crigler PA-C. Labs today, we will call you with those results. Depending on those results, you may need IV iron next week, we will also let you know if you do or don't. Return in 6 weeks for labs and follow up.   Thank you for choosing [place] at [facility] to provide your oncology and hematology care.  To afford each patient quality time with our provider, please arrive at least 15 minutes before your scheduled appointment time.    If you have a lab appointment with the [facility] please come in thru the  Main Entrance and check in at the main information desk  You need to re-schedule your appointment should you arrive 10 or more minutes late.  We strive to give you quality time with our providers, and arriving late affects you and other patients whose appointments are after yours.  Also, if you no show three or more times for appointments you may be dismissed from the clinic at the providers discretion.     Again, thank you for choosing [facility].  Our hope is that these requests will decrease the amount of time that you wait before being seen by our physicians.       _____________________________________________________________  Should you have questions after your visit to [facility], please contact our office at [phone] between the hours of 8:30 a.m. and 4:30 p.m.  Voicemails left after 4:30 p.m. will not be returned until the following business day.  For prescription refill requests, have your pharmacy contact our office.       Resources For Cancer Patients and their Caregivers ? American Cancer Society: Can assist with transportation, wigs, general needs, runs Look Good Feel Better.        929-457-1467 ? Cancer  Care: Provides financial assistance, online support groups, medication/co-pay assistance.  1-800-813-HOPE (475)291-6627) ? [place] Assists [place] cancer patients and their families through emotional , educational and financial support.  254-553-8647 ? [place] DSS Where to apply for food stamps, Medicaid and utility assistance. 445-111-5167 ? [facility]: Transportation to medical appointments. 575-411-9345 ? Social Security Administration: May apply for disability if have a Stage IV cancer. 281-036-1372 754-605-1797 ? [employer], Disability and Transit Services: Assists with nutrition, care and transit needs. [place] Support Programs: @10RELATIVEDAYS @ > Cancer Support Group  2nd Tuesday of the month 1pm-2pm, Journey Room  > Creative Journey  3rd Tuesday of the month 1130am-1pm, Journey Room  > Look Good Feel Better  1st Wednesday of the month 10am-12 noon, Journey Room (Call [place] to register [phone])

## 2016-12-07 NOTE — Assessment & Plan Note (Signed)
H/O renal disease.    Labs today: CMET, EPO.

## 2016-12-07 NOTE — Assessment & Plan Note (Addendum)
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  Two hospitalizations in March 2018 requiring 5 units of PRBCs in total for anemia.  Previous labs reviewed in detail.  I personally reviewed and went over laboratory results with the patient.  The results are noted within this dictation.  Normal serum iron with low TIBC.  Iron saturation is normal at 22% as well.  Ferritin normal, but less than 100.  I personally reviewed and went over radiographic studies with the patient.  The results are noted within this dictation.  I personally reviewed the images in PACS.  I personally reviewed and went over pathology results with the patient.  Labs today: CBC diff, CMET, anemia panel, retic count, haptoglobin, EPO, LDH, ESR, CRP, pathology smear review, sample to blood bank.  Due to anemia, will screen for multiple myeloma: SPEP + IFE, light chain assay, IgG, IgM, IgA.  Based upon her history, suspect iron deficiency anemia.  Based upon labs today, will calculate iron deficit and consider IV iron replacement therapy.  Labs in 6 weeks: CBC diff, iron/TIBC, ferritin.  Return in 6 weeks for follow-up.

## 2016-12-08 LAB — IGG, IGA, IGM
IGA: 246 mg/dL (ref 64–422)
IGM, SERUM: 48 mg/dL (ref 26–217)
IgG (Immunoglobin G), Serum: 1062 mg/dL (ref 700–1600)

## 2016-12-08 LAB — PROTEIN ELECTROPHORESIS, SERUM
A/G RATIO SPE: 1.2 (ref 0.7–1.7)
ALPHA-1-GLOBULIN: 0.3 g/dL (ref 0.0–0.4)
ALPHA-2-GLOBULIN: 0.7 g/dL (ref 0.4–1.0)
Albumin ELP: 3.6 g/dL (ref 2.9–4.4)
Beta Globulin: 1.1 g/dL (ref 0.7–1.3)
GLOBULIN, TOTAL: 3 g/dL (ref 2.2–3.9)
Gamma Globulin: 1 g/dL (ref 0.4–1.8)
Total Protein ELP: 6.6 g/dL (ref 6.0–8.5)

## 2016-12-08 LAB — KAPPA/LAMBDA LIGHT CHAINS
KAPPA, LAMDA LIGHT CHAIN RATIO: 1.36 (ref 0.26–1.65)
Kappa free light chain: 38.2 mg/L — ABNORMAL HIGH (ref 3.3–19.4)
LAMDA FREE LIGHT CHAINS: 28 mg/L — AB (ref 5.7–26.3)

## 2016-12-08 LAB — HAPTOGLOBIN: Haptoglobin: 96 mg/dL (ref 34–200)

## 2016-12-08 LAB — IMMUNOFIXATION ELECTROPHORESIS
IGM, SERUM: 46 mg/dL (ref 26–217)
IgA: 250 mg/dL (ref 64–422)
IgG (Immunoglobin G), Serum: 1072 mg/dL (ref 700–1600)
Total Protein ELP: 6.5 g/dL (ref 6.0–8.5)

## 2016-12-08 LAB — ERYTHROPOIETIN: Erythropoietin: 13.9 m[IU]/mL (ref 2.6–18.5)

## 2016-12-10 ENCOUNTER — Other Ambulatory Visit (HOSPITAL_COMMUNITY): Payer: Self-pay | Admitting: Oncology

## 2016-12-12 ENCOUNTER — Encounter: Payer: Self-pay | Admitting: Family Medicine

## 2016-12-14 ENCOUNTER — Encounter (HOSPITAL_BASED_OUTPATIENT_CLINIC_OR_DEPARTMENT_OTHER): Payer: Medicare HMO

## 2016-12-14 VITALS — BP 107/58 | HR 63 | Temp 98.3°F | Resp 18

## 2016-12-14 DIAGNOSIS — D649 Anemia, unspecified: Secondary | ICD-10-CM | POA: Diagnosis not present

## 2016-12-14 MED ORDER — SODIUM CHLORIDE 0.9 % IV SOLN
Freq: Once | INTRAVENOUS | Status: AC
  Administered 2016-12-14: 14:00:00 via INTRAVENOUS

## 2016-12-14 MED ORDER — FERUMOXYTOL INJECTION 510 MG/17 ML
510.0000 mg | Freq: Once | INTRAVENOUS | Status: AC
  Administered 2016-12-14: 510 mg via INTRAVENOUS
  Filled 2016-12-14: qty 17

## 2016-12-14 NOTE — Patient Instructions (Signed)
[place] at [facility] Discharge Instructions  RECOMMENDATIONS MADE BY THE CONSULTANT AND ANY TEST RESULTS WILL BE SENT TO YOUR REFERRING PHYSICIAN.  Today you received feraheme. Return as scheduled.   Thank you for choosing [place] at [facility] to provide your oncology and hematology care.  To afford each patient quality time with our provider, please arrive at least 15 minutes before your scheduled appointment time.    If you have a lab appointment with the [facility] please come in thru the  Main Entrance and check in at the main information desk  You need to re-schedule your appointment should you arrive 10 or more minutes late.  We strive to give you quality time with our providers, and arriving late affects you and other patients whose appointments are after yours.  Also, if you no show three or more times for appointments you may be dismissed from the clinic at the providers discretion.     Again, thank you for choosing [facility].  Our hope is that these requests will decrease the amount of time that you wait before being seen by our physicians.       _____________________________________________________________  Should you have questions after your visit to [facility], please contact our office at [phone] between the hours of 8:30 a.m. and 4:30 p.m.  Voicemails left after 4:30 p.m. will not be returned until the following business day.  For prescription refill requests, have your pharmacy contact our office.       Resources For Cancer Patients and their Caregivers ? American Cancer Society: Can assist with transportation, wigs, general needs, runs Look Good Feel Better.        (640)504-7583 ? Cancer Care: Provides financial assistance, online support groups, medication/co-pay assistance.  1-800-813-HOPE 785-753-6128) ? [place] Assists [place] cancer patients  and their families through emotional , educational and financial support.  (575) 072-5031 ? [place] DSS Where to apply for food stamps, Medicaid and utility assistance. 414-680-9784 ? [employer]: Transportation to medical appointments. 5705925126 ? Social Security Administration: May apply for disability if have a Stage IV cancer. (202)142-8026 934-853-0947 ? [employer], Disability and Transit Services: Assists with nutrition, care and transit needs. [place] Support Programs: @10RELATIVEDAYS @ > Cancer Support Group  2nd Tuesday of the month 1pm-2pm, Journey Room  > Creative Journey  3rd Tuesday of the month 1130am-1pm, Journey Room  > Look Good Feel Better  1st Wednesday of the month 10am-12 noon, Journey Room (Call [place] to register [phone])

## 2016-12-14 NOTE — Progress Notes (Signed)
Pt tolerated iron infusion without any problems. Discharged in stable condition in wheelchair with daughter @ side.

## 2016-12-22 ENCOUNTER — Encounter: Payer: Self-pay | Admitting: Family Medicine

## 2016-12-22 ENCOUNTER — Ambulatory Visit (INDEPENDENT_AMBULATORY_CARE_PROVIDER_SITE_OTHER): Payer: Medicare HMO | Admitting: Family Medicine

## 2016-12-22 VITALS — BP 134/72 | HR 84 | Temp 98.0°F | Resp 16 | Ht 67.0 in | Wt 142.0 lb

## 2016-12-22 DIAGNOSIS — E1159 Type 2 diabetes mellitus with other circulatory complications: Secondary | ICD-10-CM | POA: Diagnosis not present

## 2016-12-22 DIAGNOSIS — E7800 Pure hypercholesterolemia, unspecified: Secondary | ICD-10-CM | POA: Diagnosis not present

## 2016-12-22 DIAGNOSIS — I10 Essential (primary) hypertension: Secondary | ICD-10-CM

## 2016-12-22 DIAGNOSIS — F5101 Primary insomnia: Secondary | ICD-10-CM

## 2016-12-22 DIAGNOSIS — E440 Moderate protein-calorie malnutrition: Secondary | ICD-10-CM | POA: Diagnosis not present

## 2016-12-22 DIAGNOSIS — J410 Simple chronic bronchitis: Secondary | ICD-10-CM

## 2016-12-22 LAB — LIPID PANEL
CHOL/HDL RATIO: 3.7 ratio (ref <5.0)
Cholesterol: 160 mg/dL (ref <200)
HDL: 43 mg/dL — AB (ref >50)
LDL CALC: 92 mg/dL (ref <100)
Triglycerides: 127 mg/dL (ref <150)
VLDL: 25 mg/dL (ref <30)

## 2016-12-22 NOTE — Progress Notes (Signed)
Subjective:    Patient ID: Charlotte Henry, female    DOB: 02/27/1941, 76 y.o.   MRN: [id number]  Patient presents for Follow-up (is fasting)  She had follow-up chronic medical problems and medications reviewed. She is following with hematology as well as GI for her anemia which has resulted from a slow GI bleed noted on interest copy back in March 2018.  Last week had iron infusion has some itching and burning at IV site, has f/u in August , goal is Hb of 14 her oncology  She still has some weakness but overall feels she is doing much better. She is back to going to the senior center and doing some of her activities.  Hypertension she has been off of her blood pressure medications she also follows with cardiology as she has history of complete heart block no recent changes have been made.  COPD she uses Spiriva as prescribed as well as Advair- has not used her rescue inhaler    Insomnia- doing well, does not need nerve pill tonight   DM- diet controlled last A1C 6.1%  Hyperlipidemia- Lipids due to be rechecked she is on Zetia       Review Of Systems:  GEN- denies fatigue, fever, weight loss,+weakness, recent illness HEENT- denies eye drainage, change in vision, nasal discharge, CVS- denies chest pain, palpitations RESP- denies SOB, cough, wheeze ABD- denies N/V, change in stools, abd pain GU- denies dysuria, hematuria, dribbling, incontinence MSK- denies joint pain, muscle aches, injury Neuro- denies headache, dizziness, syncope, seizure activity       Objective:    BP 134/72   Pulse 84   Temp 98 F (36.7 C) (Oral)   Resp 16   Ht 5\' 7"  (1.702 m)   Wt 142 lb (64.4 kg)   SpO2 96%   BMI 22.24 kg/m  GEN- NAD, alert and oriented x3 HEENT- PERRL, EOMI, non injected sclera, pink conjunctiva, MMM, oropharynx clear Neck- Supple, no thyromegaly CVS- RRR, systolic murmur RESP-CTAB Skin -in tact at IV site EXT- No edema Pulses- Radial 2+        Assessment &  Plan:      Problem List Items Addressed This Visit    Hyperlipidemia   Relevant Orders   Lipid panel   Protein-calorie malnutrition ([place])    Her appetite has improved. She still has some generalized weakness. We did provide her with a case of Ensure today       Insomnia - Primary    Very rare use of her alprazolam she's not had any falls or side effects from the medicine when she does use it.      Essential hypertension    Blood pressure still is good off her medications. I will recheck her lipid panel in her A1c to her diabetes is diet-controlled. She is on zetia for cholesterol does not tolerate statins.      Diabetes mellitus ([place])    Diet controlled.   She is following up with hematology for her anemia. Advised she can use a little cortisone she is itching a diabetes site. She can also ask for Benadryl prior to the infusion she has appointment on the 15th she can discuss it with him at that time she does not have any other infusion set up for now.      Relevant Orders   Hemoglobin A1c   COPD (chronic obstructive pulmonary disease) (HCC)    Currently stable no changed her inhaler regimen.  Note: This dictation was prepared with Dragon dictation along with smaller phrase technology. Any transcriptional errors that result from this process are unintentional.

## 2016-12-22 NOTE — Assessment & Plan Note (Signed)
Blood pressure still is good off her medications. I will recheck her lipid panel in her A1c to her diabetes is diet-controlled. She is on zetia for cholesterol does not tolerate statins.

## 2016-12-22 NOTE — Assessment & Plan Note (Signed)
Diet controlled.   She is following up with hematology for her anemia. Advised she can use a little cortisone she is itching a diabetes site. She can also ask for Benadryl prior to the infusion she has appointment on the 15th she can discuss it with him at that time she does not have any other infusion set up for now.

## 2016-12-22 NOTE — Patient Instructions (Addendum)
F/U 4 months for physical We will call with lab results

## 2016-12-22 NOTE — Assessment & Plan Note (Signed)
Currently stable no changed her inhaler regimen.

## 2016-12-22 NOTE — Assessment & Plan Note (Signed)
Her appetite has improved. She still has some generalized weakness. We did provide her with a case of Ensure today

## 2016-12-22 NOTE — Assessment & Plan Note (Addendum)
Very rare use of her alprazolam she's not had any falls or side effects from the medicine when she does use it.

## 2016-12-23 LAB — HEMOGLOBIN A1C
Hgb A1c MFr Bld: 6.1 % — ABNORMAL HIGH (ref <5.7)
MEAN PLASMA GLUCOSE: 128 mg/dL

## 2016-12-28 ENCOUNTER — Encounter: Payer: Self-pay | Admitting: *Deleted

## 2017-01-10 ENCOUNTER — Other Ambulatory Visit: Payer: Self-pay | Admitting: Cardiovascular Disease

## 2017-01-16 ENCOUNTER — Other Ambulatory Visit (HOSPITAL_COMMUNITY): Payer: Self-pay | Admitting: *Deleted

## 2017-01-16 DIAGNOSIS — D649 Anemia, unspecified: Secondary | ICD-10-CM

## 2017-01-17 ENCOUNTER — Other Ambulatory Visit (HOSPITAL_COMMUNITY): Payer: Medicare HMO

## 2017-01-17 ENCOUNTER — Ambulatory Visit (HOSPITAL_COMMUNITY): Payer: Medicare HMO

## 2017-01-18 ENCOUNTER — Other Ambulatory Visit: Payer: Self-pay | Admitting: Internal Medicine

## 2017-01-19 NOTE — Progress Notes (Signed)
REVIEWED. NORMOCYTIC ANEMIA WITH NL FERRITIN JUL 2018.

## 2017-01-31 ENCOUNTER — Encounter: Payer: Self-pay | Admitting: Nurse Practitioner

## 2017-01-31 ENCOUNTER — Ambulatory Visit (INDEPENDENT_AMBULATORY_CARE_PROVIDER_SITE_OTHER): Payer: Medicare HMO | Admitting: Nurse Practitioner

## 2017-01-31 VITALS — BP 127/72 | HR 60 | Temp 97.2°F | Ht 67.0 in | Wt 142.2 lb

## 2017-01-31 DIAGNOSIS — D649 Anemia, unspecified: Secondary | ICD-10-CM | POA: Diagnosis not present

## 2017-01-31 DIAGNOSIS — R531 Weakness: Secondary | ICD-10-CM

## 2017-01-31 NOTE — Patient Instructions (Signed)
1. Call the hematology/oncology (cancer center) to have your appointment rescheduled. 2. Call your primary care provider to discuss other possible causes of weakness and some possible treatments. 3. We will request your recent labs from Dr. Buelah Manis. 4. Return for follow-up in 6 months. 5. Call us if you have any questions or concerns.

## 2017-01-31 NOTE — Progress Notes (Signed)
cc'ed to pcp °

## 2017-01-31 NOTE — Assessment & Plan Note (Signed)
The patient continues to feel she is weak. She states she is eating only one meal a day which she thinks is enough for anybody. She does drink Glucerna and/or Ensure 1-2 times a day to help supplement. I feel her weakness is likely multifactorial including chronic comorbidities, inadequate dietary intake, and deconditioning. She may benefit from physical therapy to help with strengthening. I recommended she follow-up with primary care related to weakness for other possible etiologies and to consider options to help her regain her strength.

## 2017-01-31 NOTE — Assessment & Plan Note (Signed)
The patient's anemia is normocytic with normal iron studies. She is seeing oncology. Her hemoglobin continues to improve with each lab draw. She has received IV iron with oncology to help boost her stores. At this point it does not appear that she is losing any more blood from a GI source. Return for follow-up in 6 months. Continue to see oncology.

## 2017-01-31 NOTE — Progress Notes (Signed)
Referring Provider: Alycia Rossetti, MD Primary Care Physician:  Alycia Rossetti, MD Primary GI:  Dr. Oneida Alar  Chief Complaint  Patient presents with  . Anemia  . Weakness    HPI:   Charlotte Henry is a 76 y.o. female who presents for follow-up on iron deficiency anemia. The patient was last seen in our office 11/06/2016 for generalized weakness and symptomatic anemia. At her last visit she stated she was not feeling good, weaker than she was in April. Denies overt GI bleeding. Noted decrease in appetite only eating 1 meal a day and has had a 7 pound weight loss. Drinks Glucerna for supplementation 1-2 times a day. No changes in bowel habits. Remains on by mouth iron 325 mg daily. She has a history of multiple admissions for unidentified GI bleeding source with a capsule endoscopy eventually finding a dull avoid lesion the small bowel which was clipped and injected on push enteroscopy. It was felt a significant source of her weakness was poor intake. Labs are ordered including CBC, iron studies, iFOBT. Return for follow-up in 2 months.  Her labs are completed 11/06/2016 which showed an improvement in her hemoglobin from 10.6 two months prior to 11.1. Her iron was stable at 51, saturation percent normal at 22%. Ferritin was normal at 50. iFOBT was negative. She was referred to oncology.  The patient saw oncology on 12/07/2016. At that time the recommended repeat labs including CBC, cemented, anemia panel, and others. Recommended six-week follow-up. CBC by oncology showed stability/improvement in hemoglobin to 11.5. Iron was normal at 48, TIBC low at 225, saturation normal at 21%, ferritin normal at 55. She was arranged for iron infusion on 12/14/2016. Labs dated 01/16/2017 have yet to be completed.  Today she states she's still weak. Still only eating "one good meal a day" and still using 1-2 ensure or glucerna a day. Denies abdominal pain, N/V, hematochezia, melena, fever, chills, changes  in bowel habits, unintentional weight loss. Objectively her weight is up 2 lbs since her last visit here 2 months ago. Denies chest pain, dyspnea, dizziness, lightheadedness, syncope, near syncope. Denies any other upper or lower GI symptoms.  Past Medical History:  Diagnosis Date  . 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 ASCVD (arteriosclerotic cardiovascular disease)    a. cath in 4/04,-80%LAD; 70% RCA, -> DESx2; residual 60% distal RCA; nl EF  b. Nuc 02/2016 aborted due to bradycardia. Will need to be rescheduled   . Carotid artery occlusion   . Cerebrovascular disease   . CHB (complete heart block) ([place])    a. s/p PPM on 03/24/16 with a [employer]. Jude (serial number [id number]) pacemaker  . Chronic kidney disease (CKD), stage III (moderate)   . Clostridium difficile colitis 03/2013   a. 03/2013  . COPD (chronic obstructive pulmonary disease) ([place])   . Coronary disease   . Diabetes mellitus   . Diverticulosis   . GERD (gastroesophageal reflux disease)   . Gout   . H pylori ulcer   . Hyperlipidemia    chose to stop statin, takes Zetia  . Hypertension    not currently on medication  . IBS (irritable bowel syndrome)   . PVD (peripheral vascular disease) ([place])    a. moderate to severely decreased ABI's in 8/08, sig. aortic inflow disease b. repeat ABI's in 2011 improved- mild disease on the left and moderate to severe disease on the right  . S/P placement of cardiac pacemaker    a. 03/24/16: [employer].  Jude (serial number [id number]) pacemaker  . Symptomatic anemia 08/15/2016  . TIA (transient ischemic attack) 2012  . Tobacco abuse    a. 50 pack years continuing at 1/2 pack per day  . Tobacco use disorder 10/15/2015    Past Surgical History:  Procedure Laterality Date  . ABDOMINAL HYSTERECTOMY    . carpel tunnel release     bilateral  . CHOLECYSTECTOMY    . COLONOSCOPY  2009  . COLONOSCOPY N/A 08/04/2016   incomplete due to prep.   . COLONOSCOPY N/A 08/05/2016   Dr. Oneida Alar: moderately  redundant rectosigmoid and sigmoid, normal TI, single large-mouthed diverticulum in hepatic flexure, external hemorrhoids, path negative for microscopic colitis   . ENTEROSCOPY N/A 08/18/2016   Procedure: ENTEROSCOPY;  Surgeon: Daneil Dolin, MD;  [facility];  Service: Endoscopy;  Laterality: N/A;  Pediatric colonoscope  . EP IMPLANTABLE DEVICE N/A 03/24/2016   Procedure: Pacemaker Implant;  Surgeon: [name], MD;  Location: [facility] CV LAB;  Service: Cardiovascular;  Laterality: N/A;  . ESOPHAGOGASTRODUODENOSCOPY N/A 08/04/2016   Dr. Oneida Alar: non-critical Schatzki's ring, small hiatal hernia, +H.pylori gastritis on path.   Marland Kitchen GIVENS CAPSULE STUDY N/A 08/16/2016   Procedure: GIVENS CAPSULE STUDY;  Surgeon: Daneil Dolin, MD;  Location: [facility];  Service: Endoscopy;  Laterality: N/A;  . TOTAL ABDOMINAL HYSTERECTOMY W/ BILATERAL SALPINGOOPHORECTOMY      Current Outpatient Prescriptions  Medication Sig Dispense Refill  . acetaminophen (TYLENOL) 500 MG tablet Take 500 mg by mouth every 6 (six) hours as needed for mild pain or moderate pain.    Marland Kitchen albuterol (PROVENTIL HFA;VENTOLIN HFA) 108 (90 Base) MCG/ACT inhaler Inhale 2 puffs into the lungs every 4 (four) hours as needed for wheezing or shortness of breath. 1 Inhaler 2  . ALPRAZolam (XANAX) 0.25 MG tablet Take 1 tablet (0.25 mg total) by mouth at bedtime as needed. 30 tablet 1  . calcium citrate-vitamin D (CITRACAL+D) 315-200 MG-UNIT per tablet Take 1 tablet by mouth daily.      . Cranberry 250 MG CAPS Take 250 mg by mouth daily.    . Cyanocobalamin (VITAMIN B 12 PO) Take 1 tablet by mouth daily.     Marland Kitchen esomeprazole (NEXIUM) 40 MG capsule Take 1 capsule (40 mg total) by mouth 2 (two) times daily before a meal. (Patient taking differently: Take 40 mg by mouth daily. ) 60 capsule 0  . ezetimibe (ZETIA) 10 MG tablet TAKE 1 TABLET BY MOUTH ONCE A DAY. 30 tablet 0  . ferrous sulfate 325 (65 FE) MG tablet Take 325 mg by mouth  daily with breakfast.    . Fluticasone-Salmeterol (ADVAIR) 100-50 MCG/DOSE AEPB Inhale 1 puff into the lungs 2 (two) times daily. (Patient taking differently: Inhale 1 puff into the lungs as needed. ) 1 each 3  . gabapentin (NEURONTIN) 100 MG capsule TAKE 1 TO 2 CAPSULES BY MOUTH AT BEDTIME FOR NERVE PAIN. 60 capsule 0  . lubiprostone (AMITIZA) 8 MCG capsule Take 8 mcg by mouth as needed.     . mirabegron ER (MYRBETRIQ) 25 MG TB24 tablet Take 1 tablet (25 mg total) by mouth at bedtime. For bladder (Patient taking differently: Take 25 mg by mouth as needed. For bladder) 30 tablet 6  . Multiple Vitamin (MULTIVITAMIN WITH MINERALS) TABS tablet Take 1 tablet by mouth daily.    . Probiotic Product (PROBIOTIC DAILY PO) Take 1 capsule by mouth daily.     . promethazine (PHENERGAN) 12.5 MG tablet 1-2  po 30 MINS PRIOR TO MEDS OR MEALS. MAY REPEAT EVERY 4-6 h prn nausea or vomiting 40 tablet 1  . Tiotropium Bromide Monohydrate (SPIRIVA RESPIMAT) 1.25 MCG/ACT AERS Inhale 2 puffs into the lungs daily. (Patient taking differently: Inhale 2 puffs into the lungs as needed. ) 1 Inhaler 11   No current facility-administered medications for this visit.     Allergies as of 01/31/2017 - Review Complete 01/31/2017  Allergen Reaction Noted  . Ace inhibitors Hives, Shortness Of Breath, and Swelling 10/27/2008  . Aspirin Other (See Comments) 10/27/2008  . Codeine Swelling 02/02/2011  . Pineapple Shortness Of Breath and Swelling 09/03/2013  . Plasticized base [plastibase] Hives and Swelling 02/07/2012  . Ultram [tramadol] Swelling 03/21/2012  . Adhesive [tape] Swelling and Rash 02/07/2012  . Naprosyn [naproxen] Swelling 02/02/2011  . Biaxin [clarithromycin] Nausea And Vomiting and Other (See Comments) 07/04/2016  . Clindamycin/lincomycin Other (See Comments) 05/04/2014  . Linzess [linaclotide]  02/26/2013  . Metronidazole  06/29/2016  . Nsaids  03/04/2012  . Penicillins  05/04/2014  . Varenicline tartrate  Swelling 10/27/2008    Family History  Problem Relation Age of Onset  . Liver disease Mother 36  . Hypertension Mother   . Cerebral aneurysm Father   . Aneurysm Father 55  . Cancer Sister   . Liver disease Brother   . Liver disease Brother   . Liver disease Brother   . Diabetes type II Brother   . Alcohol abuse Brother   . Cancer Sister   . Colon cancer Neg Hx     Social History   Social History  . Marital status: Widowed    Spouse name: N/A  . Number of children: N/A  . Years of education: N/A   Social History Main Topics  . Smoking status: Current Every Day Smoker    Packs/day: 1.00    Years: 50.00    Types: Cigarettes    Start date: 02/02/1966  . Smokeless tobacco: Never Used     Comment: 1/2 pack daily  . Alcohol use No     Comment: H/O of 6 pack per day quiting in 2003  . Drug use: No  . Sexual activity: Not Currently   Other Topics Concern  . None   Social History Narrative  . None    Review of Systems: Complete ROS negative except as per HPI.   Physical Exam: BP 127/72   Pulse 60   Temp (!) 97.2 F (36.2 C) (Oral)   Ht 5\' 7"  (1.702 m)   Wt 142 lb 3.2 oz (64.5 kg)   BMI 22.27 kg/m  General:   Alert and oriented. Pleasant and cooperative. Well-nourished and well-developed. Appears weak. Eyes:  Without icterus, sclera clear and conjunctiva pink.  Ears:  Normal auditory acuity. Cardiovascular:  S1, S2 present without murmurs appreciated. Extremities without clubbing or edema. Respiratory:  Clear to auscultation bilaterally. No wheezes, rales, or rhonchi. No distress.  Gastrointestinal:  +BS, soft, non-tender and non-distended. No HSM noted. No guarding or rebound. No masses appreciated.  Rectal:  Deferred  Musculoskalatal:  Symmetrical without gross deformities. Weak/unsteady on her feet. Neurologic:  Alert and oriented x4;  grossly normal neurologically. Psych:  Alert and cooperative. Normal mood and affect. Heme/Lymph/Immune: No excessive  bruising noted.    01/31/2017 10:28 AM   Disclaimer: This note was dictated with voice recognition software. Similar sounding words can inadvertently be transcribed and may not be corrected upon review.

## 2017-02-21 ENCOUNTER — Other Ambulatory Visit: Payer: Self-pay | Admitting: Family Medicine

## 2017-02-21 ENCOUNTER — Ambulatory Visit (INDEPENDENT_AMBULATORY_CARE_PROVIDER_SITE_OTHER): Payer: Medicare HMO | Admitting: *Deleted

## 2017-02-21 DIAGNOSIS — I442 Atrioventricular block, complete: Secondary | ICD-10-CM

## 2017-02-21 NOTE — Progress Notes (Signed)
Remote pacemaker transmission.   

## 2017-02-22 ENCOUNTER — Encounter (HOSPITAL_COMMUNITY): Payer: Self-pay | Admitting: Oncology

## 2017-02-22 ENCOUNTER — Encounter (HOSPITAL_BASED_OUTPATIENT_CLINIC_OR_DEPARTMENT_OTHER): Payer: Medicare HMO | Admitting: Oncology

## 2017-02-22 ENCOUNTER — Encounter (HOSPITAL_COMMUNITY): Payer: Medicare HMO | Attending: Adult Health

## 2017-02-22 VITALS — BP 111/54 | HR 60 | Resp 16

## 2017-02-22 DIAGNOSIS — K219 Gastro-esophageal reflux disease without esophagitis: Secondary | ICD-10-CM | POA: Diagnosis not present

## 2017-02-22 DIAGNOSIS — E785 Hyperlipidemia, unspecified: Secondary | ICD-10-CM | POA: Diagnosis not present

## 2017-02-22 DIAGNOSIS — Z8673 Personal history of transient ischemic attack (TIA), and cerebral infarction without residual deficits: Secondary | ICD-10-CM | POA: Diagnosis not present

## 2017-02-22 DIAGNOSIS — J449 Chronic obstructive pulmonary disease, unspecified: Secondary | ICD-10-CM | POA: Insufficient documentation

## 2017-02-22 DIAGNOSIS — M1990 Unspecified osteoarthritis, unspecified site: Secondary | ICD-10-CM | POA: Insufficient documentation

## 2017-02-22 DIAGNOSIS — K5900 Constipation, unspecified: Secondary | ICD-10-CM | POA: Diagnosis not present

## 2017-02-22 DIAGNOSIS — I129 Hypertensive chronic kidney disease with stage 1 through stage 4 chronic kidney disease, or unspecified chronic kidney disease: Secondary | ICD-10-CM | POA: Diagnosis not present

## 2017-02-22 DIAGNOSIS — N183 Chronic kidney disease, stage 3 (moderate): Secondary | ICD-10-CM | POA: Diagnosis not present

## 2017-02-22 DIAGNOSIS — E1151 Type 2 diabetes mellitus with diabetic peripheral angiopathy without gangrene: Secondary | ICD-10-CM | POA: Insufficient documentation

## 2017-02-22 DIAGNOSIS — E1122 Type 2 diabetes mellitus with diabetic chronic kidney disease: Secondary | ICD-10-CM | POA: Diagnosis not present

## 2017-02-22 DIAGNOSIS — Z720 Tobacco use: Secondary | ICD-10-CM

## 2017-02-22 DIAGNOSIS — D649 Anemia, unspecified: Secondary | ICD-10-CM | POA: Insufficient documentation

## 2017-02-22 DIAGNOSIS — I6529 Occlusion and stenosis of unspecified carotid artery: Secondary | ICD-10-CM | POA: Insufficient documentation

## 2017-02-22 DIAGNOSIS — G629 Polyneuropathy, unspecified: Secondary | ICD-10-CM | POA: Diagnosis not present

## 2017-02-22 DIAGNOSIS — R5383 Other fatigue: Secondary | ICD-10-CM | POA: Diagnosis not present

## 2017-02-22 DIAGNOSIS — E611 Iron deficiency: Secondary | ICD-10-CM

## 2017-02-22 DIAGNOSIS — Z950 Presence of cardiac pacemaker: Secondary | ICD-10-CM | POA: Diagnosis not present

## 2017-02-22 DIAGNOSIS — I2510 Atherosclerotic heart disease of native coronary artery without angina pectoris: Secondary | ICD-10-CM | POA: Insufficient documentation

## 2017-02-22 DIAGNOSIS — F17210 Nicotine dependence, cigarettes, uncomplicated: Secondary | ICD-10-CM | POA: Diagnosis not present

## 2017-02-22 LAB — CUP PACEART REMOTE DEVICE CHECK
Battery Remaining Longevity: 104 mo
Brady Statistic AP VP Percent: 44 %
Brady Statistic AP VS Percent: 1 %
Brady Statistic AS VP Percent: 56 %
Brady Statistic AS VS Percent: 1 %
Brady Statistic RA Percent Paced: 43 %
Date Time Interrogation Session: 20180919094920
Implantable Lead Implant Date: 20171020
Implantable Lead Location: 753860
Lead Channel Impedance Value: 430 Ohm
Lead Channel Impedance Value: 490 Ohm
Lead Channel Pacing Threshold Amplitude: 0.5 V
Lead Channel Pacing Threshold Pulse Width: 0.4 ms
Lead Channel Pacing Threshold Pulse Width: 0.4 ms
Lead Channel Sensing Intrinsic Amplitude: 12 mV
Lead Channel Setting Pacing Amplitude: 2 V
Lead Channel Setting Sensing Sensitivity: 3.5 mV
MDC IDC LEAD IMPLANT DT: 20171020
MDC IDC LEAD LOCATION: 753859
MDC IDC MSMT BATTERY REMAINING PERCENTAGE: 95.5 %
MDC IDC MSMT BATTERY VOLTAGE: 2.99 V
MDC IDC MSMT LEADCHNL RA PACING THRESHOLD AMPLITUDE: 0.5 V
MDC IDC MSMT LEADCHNL RA SENSING INTR AMPL: 2 mV
MDC IDC PG IMPLANT DT: 20171020
MDC IDC PG SERIAL: 3180497
MDC IDC SET LEADCHNL RV PACING AMPLITUDE: 2.5 V
MDC IDC SET LEADCHNL RV PACING PULSEWIDTH: 0.4 ms
MDC IDC STAT BRADY RV PERCENT PACED: 99 %

## 2017-02-22 LAB — CBC WITH DIFFERENTIAL/PLATELET
BASOS ABS: 0 10*3/uL (ref 0.0–0.1)
BASOS PCT: 0 %
EOS ABS: 0.1 10*3/uL (ref 0.0–0.7)
EOS PCT: 1 %
HCT: 31.7 % — ABNORMAL LOW (ref 36.0–46.0)
Hemoglobin: 10.9 g/dL — ABNORMAL LOW (ref 12.0–15.0)
Lymphocytes Relative: 31 %
Lymphs Abs: 2 10*3/uL (ref 0.7–4.0)
MCH: 31.1 pg (ref 26.0–34.0)
MCHC: 34.4 g/dL (ref 30.0–36.0)
MCV: 90.3 fL (ref 78.0–100.0)
MONO ABS: 0.6 10*3/uL (ref 0.1–1.0)
Monocytes Relative: 9 %
Neutro Abs: 3.9 10*3/uL (ref 1.7–7.7)
Neutrophils Relative %: 59 %
PLATELETS: 210 10*3/uL (ref 150–400)
RBC: 3.51 MIL/uL — AB (ref 3.87–5.11)
RDW: 14.5 % (ref 11.5–15.5)
WBC: 6.6 10*3/uL (ref 4.0–10.5)

## 2017-02-22 LAB — IRON AND TIBC
IRON: 65 ug/dL (ref 28–170)
SATURATION RATIOS: 30 % (ref 10.4–31.8)
TIBC: 216 ug/dL — AB (ref 250–450)
UIBC: 151 ug/dL

## 2017-02-22 LAB — FERRITIN: Ferritin: 322 ng/mL — ABNORMAL HIGH (ref 11–307)

## 2017-02-22 NOTE — Progress Notes (Signed)
[facility] Follow up:    Alycia Rossetti, MD [address] [place] [address]   DIAGNOSIS: Normocytic anemia  CURRENT THERAPY: Observation  INTERVAL HISTORY: Charlotte Henry 76 y.o. female returns for continued follow up of her anemia. She states she feels very weak especially in her legs. She states her appetite is at 25%, she drinks Ensures daily. She complains of fatigue. Also has constipation. Chronic neuropathy in her feet. She last received a dose of feraheme on 12/14/16. She denies any bleeding including hematuria, hematochezia, melena.   Patient Active Problem List   Diagnosis Date Noted  . Symptomatic anemia 08/15/2016  . Protein-calorie malnutrition ([place]) 08/04/2016  . Helicobacter pylori gastritis 07/26/2016  . Generalized weakness 07/09/2016  . Normocytic anemia 07/03/2016  . CHB (complete heart block) ([place])   . COPD (chronic obstructive pulmonary disease) ([place])   . Tobacco use disorder 10/15/2015  . Back pain 12/30/2014  . OA (osteoarthritis) 12/30/2014  . Carotid stenosis 03/11/2014  . PVD (peripheral vascular disease) ([place]) 03/11/2014  . Colon cancer screening 02/04/2014  . Overactive bladder 12/31/2013  . Rash and nonspecific skin eruption 05/19/2013  . Numbness and tingling-bilat foot 03/05/2013  . IBS (irritable bowel syndrome) 12/30/2012  . Peripheral neuropathy 07/01/2012  . Insomnia 07/01/2012  . Chronic kidney disease (CKD), stage III (moderate) 08/25/2010  . Cardiovascular disease 08/16/2009  . Cerebrovascular disease 08/16/2009  . Peripheral vascular disease ([place]) 08/16/2009  . Diabetes mellitus ([place]) 10/26/2008  . Hyperlipidemia 10/26/2008  . Gout, unspecified 10/26/2008  . Essential hypertension 10/26/2008  . Asthmatic bronchitis 10/26/2008  . Gastroesophageal reflux disease 10/26/2008    is allergic to ace inhibitors; aspirin; codeine; pineapple; plasticized base [plastibase]; ultram [tramadol]; adhesive  [tape]; naprosyn [naproxen]; biaxin [clarithromycin]; clindamycin/lincomycin; linzess [linaclotide]; metronidazole; nsaids; penicillins; and varenicline tartrate.  MEDICAL HISTORY: Past Medical History:  Diagnosis Date  . 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 ASCVD (arteriosclerotic cardiovascular disease)    a. cath in 4/04,-80%LAD; 70% RCA, -> DESx2; residual 60% distal RCA; nl EF  b. Nuc 02/2016 aborted due to bradycardia. Will need to be rescheduled   . Carotid artery occlusion   . Cerebrovascular disease   . CHB (complete heart block) ([place])    a. s/p PPM on 03/24/16 with a St. Jude (serial number [id number]) pacemaker  . Chronic kidney disease (CKD), stage III (moderate)   . Clostridium difficile colitis 03/2013   a. 03/2013  . COPD (chronic obstructive pulmonary disease) ([place])   . Coronary disease   . Diabetes mellitus   . Diverticulosis   . GERD (gastroesophageal reflux disease)   . Gout   . H pylori ulcer   . Hyperlipidemia    chose to stop statin, takes Zetia  . Hypertension    not currently on medication  . IBS (irritable bowel syndrome)   . PVD (peripheral vascular disease) ([place])    a. moderate to severely decreased ABI's in 8/08, sig. aortic inflow disease b. repeat ABI's in 2011 improved- mild disease on the left and moderate to severe disease on the right  . S/P placement of cardiac pacemaker    a. 03/24/16: St. Jude (serial number [id number]) pacemaker  . Symptomatic anemia 08/15/2016  . TIA (transient ischemic attack) 2012  . Tobacco abuse    a. 50 pack years continuing at 1/2 pack per day  . Tobacco use disorder 10/15/2015    SURGICAL HISTORY: Past Surgical History:  Procedure Laterality Date  . ABDOMINAL  HYSTERECTOMY    . carpel tunnel release     bilateral  . CHOLECYSTECTOMY    . COLONOSCOPY  2009  . COLONOSCOPY N/A 08/04/2016   incomplete due to prep.   . COLONOSCOPY N/A 08/05/2016   Dr. Oneida Alar: moderately redundant rectosigmoid and sigmoid, normal TI,  single large-mouthed diverticulum in hepatic flexure, external hemorrhoids, path negative for microscopic colitis   . ENTEROSCOPY N/A 08/18/2016   Procedure: ENTEROSCOPY;  Surgeon: Daneil Dolin, MD;  [facility];  Service: Endoscopy;  Laterality: N/A;  Pediatric colonoscope  . EP IMPLANTABLE DEVICE N/A 03/24/2016   Procedure: Pacemaker Implant;  Surgeon: [name], MD;  Location: [facility];  Service: Cardiovascular;  Laterality: N/A;  . ESOPHAGOGASTRODUODENOSCOPY N/A 08/04/2016   Dr. Oneida Alar: non-critical Schatzki's ring, small hiatal hernia, +H.pylori gastritis on path.   Marland Kitchen GIVENS CAPSULE STUDY N/A 08/16/2016   Procedure: GIVENS CAPSULE STUDY;  Surgeon: Daneil Dolin, MD;  Location[facility];  Service: Endoscopy;  Laterality: N/A;  . TOTAL ABDOMINAL HYSTERECTOMY W/ BILATERAL SALPINGOOPHORECTOMY      SOCIAL HISTORY: Social History   Social History  . Marital status: Widowed    Spouse name: N/A  . Number of children: N/A  . Years of education: N/A   Occupational History  . Not on file.   Social History Main Topics  . Smoking status: Current Every Day Smoker    Packs/day: 1.00    Years: 50.00    Types: Cigarettes    Start date: 02/02/1966  . Smokeless tobacco: Never Used     Comment: 1/2 pack daily  . Alcohol use No     Comment: H/O of 6 pack per day quiting in 2003  . Drug use: No  . Sexual activity: Not Currently   Other Topics Concern  . Not on file   Social History Narrative  . No narrative on file    FAMILY HISTORY: Family History  Problem Relation Age of Onset  . Liver disease Mother 54  . Hypertension Mother   . Cerebral aneurysm Father   . Aneurysm Father 72  . Cancer Sister   . Liver disease Brother   . Liver disease Brother   . Liver disease Brother   . Diabetes type II Brother   . Alcohol abuse Brother   . Cancer Sister   . Colon cancer Neg Hx     Review of Systems - Oncology  ROS as per HPI otherwise 12 point ROS is  negative.   PHYSICAL EXAMINATION   Vitals:   02/22/17 1502  BP: (!) 111/54  Pulse: 60  Resp: 16  SpO2: 98%    Physical Exam Constitutional: Well-developed, well-nourished, and in no distress.   HENT:  Head: Normocephalic and atraumatic.  Mouth/Throat: No oropharyngeal exudate. Mucosa moist. Eyes: Pupils are equal, round, and reactive to light. Conjunctivae are normal. No scleral icterus.  Neck: Normal range of motion. Neck supple. No JVD present.  Cardiovascular: Normal rate, regular rhythm and normal heart sounds.  Exam reveals no gallop and no friction rub.  +pacemaker. No murmur heard. Pulmonary/Chest: Effort normal and breath sounds normal. No respiratory distress. No wheezes.No rales.  Abdominal: Soft. Bowel sounds are normal. No distension. There is no tenderness. There is no guarding.  Musculoskeletal: No edema or tenderness.  Lymphadenopathy:    No cervical or supraclavicular adenopathy.  Neurological: Alert and oriented to person, place, and time. No cranial nerve deficit.  Skin: Skin is warm and dry. No  rash noted. No erythema. No pallor.  Psychiatric: Affect and judgment normal.   LABORATORY DATA:  CBC    Component Value Date/Time   WBC 6.6 02/22/2017 1423   RBC 3.51 (L) 02/22/2017 1423   HGB 10.9 (L) 02/22/2017 1423   HCT 31.7 (L) 02/22/2017 1423   PLT 210 02/22/2017 1423   MCV 90.3 02/22/2017 1423   MCH 31.1 02/22/2017 1423   MCHC 34.4 02/22/2017 1423   RDW 14.5 02/22/2017 1423   LYMPHSABS 2.0 02/22/2017 1423   MONOABS 0.6 02/22/2017 1423   EOSABS 0.1 02/22/2017 1423   BASOSABS 0.0 02/22/2017 1423    CMP     Component Value Date/Time   NA 138 12/07/2016 1206   K 3.9 12/07/2016 1206   CL 104 12/07/2016 1206   CO2 28 12/07/2016 1206   GLUCOSE 101 (H) 12/07/2016 1206   BUN 18 12/07/2016 1206   CREATININE 0.94 12/07/2016 1206   CREATININE 1.05 (H) 08/09/2016 1439   CALCIUM 9.7 12/07/2016 1206   PROT 6.9 12/07/2016 1206   ALBUMIN 3.8 12/07/2016  1206   AST 20 12/07/2016 1206   ALT 9 (L) 12/07/2016 1206   ALKPHOS 55 12/07/2016 1206   BILITOT 0.6 12/07/2016 1206   GFRNONAA 57 (L) 12/07/2016 1206   GFRNONAA 57 (L) 07/01/2014 0846   GFRAA >60 12/07/2016 1206   GFRAA 66 07/01/2014 0846     Assessment: Normocytic anemia Workup has been negative except for mild iron deficiency with ferritin 55 for which she received one dose of feraheme on 12/14/16.  PLAN: Reviewed CBC with patient today. Rest of labs pending. Hemoglobin 10.9 g/dL today. Will follow up on her iron studies and see if she needs more iron. RTC in 4 months for follow up with labs.    Orders Placed This Encounter  Procedures  . CBC with Differential    Standing Status:   Future    Standing Expiration Date:   02/22/2018  . Comprehensive metabolic panel    Standing Status:   Future    Standing Expiration Date:   02/22/2018  . Erythropoietin    Standing Status:   Future    Standing Expiration Date:   02/22/2018  . Ferritin    Standing Status:   Future    Standing Expiration Date:   02/22/2018  . Iron and TIBC    Standing Status:   Future    Standing Expiration Date:   02/22/2018    All questions were answered. The patient knows to call the clinic with any problems, questions or concerns. We can certainly see the patient much sooner if necessary. This note was electronically signed. Twana First, MD 02/22/2017

## 2017-02-23 ENCOUNTER — Encounter: Payer: Self-pay | Admitting: Cardiology

## 2017-02-26 ENCOUNTER — Encounter: Payer: Self-pay | Admitting: Family Medicine

## 2017-02-26 ENCOUNTER — Ambulatory Visit (INDEPENDENT_AMBULATORY_CARE_PROVIDER_SITE_OTHER): Payer: Medicare HMO | Admitting: Family Medicine

## 2017-02-26 VITALS — BP 144/68 | HR 68 | Temp 97.9°F | Resp 18 | Wt 141.2 lb

## 2017-02-26 DIAGNOSIS — I10 Essential (primary) hypertension: Secondary | ICD-10-CM

## 2017-02-26 DIAGNOSIS — R2681 Unsteadiness on feet: Secondary | ICD-10-CM

## 2017-02-26 DIAGNOSIS — R531 Weakness: Secondary | ICD-10-CM | POA: Diagnosis not present

## 2017-02-26 DIAGNOSIS — E440 Moderate protein-calorie malnutrition: Secondary | ICD-10-CM | POA: Diagnosis not present

## 2017-02-26 DIAGNOSIS — J410 Simple chronic bronchitis: Secondary | ICD-10-CM | POA: Diagnosis not present

## 2017-02-26 MED ORDER — GABAPENTIN 100 MG PO CAPS: ORAL_CAPSULE | ORAL | 6 refills | Status: DC

## 2017-02-26 NOTE — Progress Notes (Signed)
   Subjective:    Patient ID: Charlotte Henry, female    DOB: 06-16-1940, 76 y.o.   MRN: [id number]  Patient presents for questions from cancer center (was there Friday with questions and they told her to come here)   Was seen by oncology for her iron infusion.   Feels weak in her legs , she only eats one meal a day, states if she more she feels nausea. Maitaining her weight around 140-142lbs. States that she just feels weak in her legs and arms she is always tired she can't do her activities like she did before. I reviewed her recent gastroenterology note as well as her hematology note. Reviewed her last set of labs  He does not have any specific pain or concern today. Has not had any cough or congestion she's not been using her inhalers states that her breathing has been good.   Reviewed labs- B12 was normal 2 months ago, labs from 9/20 CBC and iron studies were much improved   Due for flu shot   Review Of Systems:  GEN- + fatigue, fever, weight loss,+weakness, recent illness HEENT- denies eye drainage, change in vision, nasal discharge, CVS- denies chest pain, palpitations RESP- denies SOB, cough, wheeze ABD- denies N/V, change in stools, abd pain GU- denies dysuria, hematuria, dribbling, incontinence MSK- denies joint pain,+ muscle aches, injury Neuro- denies headache, dizziness, syncope, seizure activity       Objective:    BP (!) 144/68 (BP Location: Left Arm, Patient Position: Sitting, Cuff Size: Normal)   Pulse 68   Temp 97.9 F (36.6 C) (Oral)   Resp 18   Wt 141 lb 3.2 oz (64 kg)   BMI 22.12 kg/m  GEN- NAD, alert and oriented x3,unsteady gait  HEENT- PERRL, EOMI, non injected sclera, pink conjunctiva, MMM, oropharynx clear Neck- Supple, no thyromegaly CVS- RRR, 2/6 SEM RESP-CTAB ABD-NABS,soft,NT,ND EXT- No edema Pulses- Radial  2+        Assessment & Plan:      Problem List Items Addressed This Visit      Unprioritized   Essential hypertension -  Primary   COPD (chronic obstructive pulmonary disease) (HCC)   Generalized weakness   Protein-calorie malnutrition ([place])    I think her weakness and fatigue is multifactorial as. I reviewed her labs there is no sign of any acute organ failure at this time her hemoglobin is steady her blood pressure looks okay weight is actually steady despite her not wanting to Eat. She reviewed gastroenterology and hematology note seems that she is on the maximum therapy at this time. Just does not want have to take any other medication. Recommended that she do use her inhalers as specimen to the winter months to help protect her lungs and she does have COPD. I've given her a case of ensure from the office today. Try to bargain with her to eat an actual meal at least twice a day in then used to ensure supplements. She is getting out of the house and going to the senior center. To help with her gait instability which has worsened since her hospitalization with her blood loss I will get her rolling walker with seat to make her more ambulatory.        Gait instability      Note: This dictation was prepared with Dragon dictation along with smaller phrase technology. Any transcriptional errors that result from this process are unintentional.

## 2017-02-26 NOTE — Patient Instructions (Addendum)
Eat twice a day  Use your inhalers every day- advair and spiriva Schedule a flu shot  Rollator sent to [facility]  F/U 3 months

## 2017-02-26 NOTE — Assessment & Plan Note (Signed)
I think her weakness and fatigue is multifactorial as. I reviewed her labs there is no sign of any acute organ failure at this time her hemoglobin is steady her blood pressure looks okay weight is actually steady despite her not wanting to Eat. She reviewed gastroenterology and hematology note seems that she is on the maximum therapy at this time. Just does not want have to take any other medication. Recommended that she do use her inhalers as specimen to the winter months to help protect her lungs and she does have COPD. I've given her a case of ensure from the office today. Try to bargain with her to eat an actual meal at least twice a day in then used to ensure supplements. She is getting out of the house and going to the senior center. To help with her gait instability which has worsened since her hospitalization with her blood loss I will get her rolling walker with seat to make her more ambulatory.

## 2017-02-27 ENCOUNTER — Telehealth: Payer: Self-pay | Admitting: Family Medicine

## 2017-02-27 NOTE — Telephone Encounter (Signed)
Provider gave me Rx for Rolator Walker for patient.  [facility] is not in network for DME with her insurance.  Order faxed to [facility], they are in network and will deliver.  I left patient a message advising her of above and that [facility] will contact to arrange delivery.

## 2017-03-08 ENCOUNTER — Ambulatory Visit (INDEPENDENT_AMBULATORY_CARE_PROVIDER_SITE_OTHER): Payer: Medicare HMO | Admitting: Family Medicine

## 2017-03-08 DIAGNOSIS — Z23 Encounter for immunization: Secondary | ICD-10-CM

## 2017-03-12 DIAGNOSIS — R2681 Unsteadiness on feet: Secondary | ICD-10-CM | POA: Diagnosis not present

## 2017-03-12 DIAGNOSIS — R531 Weakness: Secondary | ICD-10-CM | POA: Diagnosis not present

## 2017-03-16 ENCOUNTER — Other Ambulatory Visit: Payer: Self-pay | Admitting: Cardiovascular Disease

## 2017-03-26 ENCOUNTER — Encounter: Payer: Self-pay | Admitting: Cardiovascular Disease

## 2017-03-26 ENCOUNTER — Ambulatory Visit (INDEPENDENT_AMBULATORY_CARE_PROVIDER_SITE_OTHER): Payer: Medicare HMO | Admitting: Cardiovascular Disease

## 2017-03-26 VITALS — BP 132/66 | HR 60 | Ht 67.0 in | Wt 146.0 lb

## 2017-03-26 DIAGNOSIS — I25118 Atherosclerotic heart disease of native coronary artery with other forms of angina pectoris: Secondary | ICD-10-CM

## 2017-03-26 DIAGNOSIS — I739 Peripheral vascular disease, unspecified: Secondary | ICD-10-CM | POA: Diagnosis not present

## 2017-03-26 DIAGNOSIS — E7800 Pure hypercholesterolemia, unspecified: Secondary | ICD-10-CM

## 2017-03-26 DIAGNOSIS — Z955 Presence of coronary angioplasty implant and graft: Secondary | ICD-10-CM | POA: Diagnosis not present

## 2017-03-26 DIAGNOSIS — Z950 Presence of cardiac pacemaker: Secondary | ICD-10-CM

## 2017-03-26 DIAGNOSIS — I10 Essential (primary) hypertension: Secondary | ICD-10-CM | POA: Diagnosis not present

## 2017-03-26 DIAGNOSIS — I6523 Occlusion and stenosis of bilateral carotid arteries: Secondary | ICD-10-CM | POA: Diagnosis not present

## 2017-03-26 MED ORDER — EZETIMIBE 10 MG PO TABS: 10.0000 mg | ORAL_TABLET | Freq: Every day | ORAL | 3 refills | Status: DC

## 2017-03-26 NOTE — Patient Instructions (Signed)
Your physician wants you to follow-up in: 1 year with Dr.Koneswaran You will receive a reminder letter in the mail two months in advance. If you don't receive a letter, please call our office to schedule the follow-up appointment.    Your physician recommends that you continue on your current medications as directed. Please refer to the Current Medication list given to you today.     If you need a refill on your cardiac medications before your next appointment, please call your pharmacy.     No tests or labs ordered today.      Thank you for choosing [facility] !

## 2017-03-26 NOTE — Progress Notes (Signed)
SUBJECTIVE: The patient is a 76 yr old woman with paroxysmal atrial fibrillation who has a pacemaker for high grade heart block. She last saw Dr. Lovena Le on 08/23/16. She is not a candidate for anticoagulation due to prior significant GI bleeding issues. She underwent drug-eluting stent placement to the LAD and RCA in 2004.  Lipids reviewed from 12/22/16: TC 160, TG 127, HDL 43, LDL 92.  Denies chest pain and shortness of breath.  Smokes 1 ppd x 50 years.  Has a normocytic anemia.  She uses a cane and a walker.  She said "I have good days and bad days".  When we talked about tobacco use she said, "It's the only thing I have to do".   Review of Systems: As per "subjective", otherwise negative.  Allergies  Allergen Reactions  . Ace Inhibitors Hives, Shortness Of Breath and Swelling  . Aspirin Other (See Comments)    Blood in stool and nose bleed  . Codeine Swelling  . Pineapple Shortness Of Breath and Swelling  . Plasticized Base [Plastibase] Hives and Swelling    No plastics  . Ultram [Tramadol] Swelling  . Adhesive [Tape] Swelling and Rash  . Naprosyn [Naproxen] Swelling  . Biaxin [Clarithromycin] Nausea And Vomiting and Other (See Comments)    Dizziness.  . Clindamycin/Lincomycin Other (See Comments)    Burning sensation throat, abdomen  . Linzess [Linaclotide]     DIARRHEA, FECAL INCONTINENCE  . Metronidazole     NAUSEA AND WEAKNESS  . Nsaids   . Penicillins     Lip swelling, Hives Has patient had a PCN reaction causing immediate rash, facial/tongue/throat swelling, SOB or lightheadedness with hypotension:YES Has patient had a PCN reaction causing severe rash involving mucus membranes or skin necrosis: NO Has patient had a PCN reaction that required hospitalization NO Has patient had a PCN reaction occurring within the last 10 years: NO If all of the above answers are "NO", then may proceed with Cephalosporin use.  . Varenicline Tartrate Swelling    Current  Outpatient Prescriptions  Medication Sig Dispense Refill  . acetaminophen (TYLENOL) 500 MG tablet Take 500 mg by mouth every 6 (six) hours as needed for mild pain or moderate pain.    Marland Kitchen albuterol (PROVENTIL HFA;VENTOLIN HFA) 108 (90 Base) MCG/ACT inhaler Inhale 2 puffs into the lungs every 4 (four) hours as needed for wheezing or shortness of breath. 1 Inhaler 2  . ALPRAZolam (XANAX) 0.25 MG tablet Take 1 tablet (0.25 mg total) by mouth at bedtime as needed. 30 tablet 1  . calcium citrate-vitamin D (CITRACAL+D) 315-200 MG-UNIT per tablet Take 1 tablet by mouth daily.      . Cranberry 250 MG CAPS Take 250 mg by mouth daily.    . Cyanocobalamin (VITAMIN B 12 PO) Take 1 tablet by mouth daily.     Marland Kitchen esomeprazole (NEXIUM) 40 MG capsule Take 1 capsule (40 mg total) by mouth 2 (two) times daily before a meal. (Patient taking differently: Take 40 mg by mouth daily. ) 60 capsule 0  . ezetimibe (ZETIA) 10 MG tablet TAKE 1 TABLET BY MOUTH ONCE A DAY. 30 tablet 0  . ferrous sulfate 325 (65 FE) MG tablet Take 325 mg by mouth daily with breakfast.    . Fluticasone-Salmeterol (ADVAIR) 100-50 MCG/DOSE AEPB Inhale 1 puff into the lungs 2 (two) times daily. (Patient taking differently: Inhale 1 puff into the lungs as needed. ) 1 each 3  . gabapentin (NEURONTIN) 100 MG capsule TAKE  1 TO 2 CAPSULES BY MOUTH AT BEDTIME FOR NERVE PAIN. 60 capsule 6  . lubiprostone (AMITIZA) 8 MCG capsule Take 8 mcg by mouth as needed.     . mirabegron ER (MYRBETRIQ) 25 MG TB24 tablet Take 1 tablet (25 mg total) by mouth at bedtime. For bladder (Patient taking differently: Take 25 mg by mouth as needed. For bladder) 30 tablet 6  . Multiple Vitamin (MULTIVITAMIN WITH MINERALS) TABS tablet Take 1 tablet by mouth daily.    . Probiotic Product (PROBIOTIC DAILY PO) Take 1 capsule by mouth daily.     . promethazine (PHENERGAN) 12.5 MG tablet 1-2 po 30 MINS PRIOR TO MEDS OR MEALS. MAY REPEAT EVERY 4-6 h prn nausea or vomiting 40 tablet 1  .  Tiotropium Bromide Monohydrate (SPIRIVA RESPIMAT) 1.25 MCG/ACT AERS Inhale 2 puffs into the lungs daily. (Patient taking differently: Inhale 2 puffs into the lungs as needed. ) 1 Inhaler 11   No current facility-administered medications for this visit.     Past Medical History:  Diagnosis Date  . 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 ASCVD (arteriosclerotic cardiovascular disease)    a. cath in 4/04,-80%LAD; 70% RCA, -> DESx2; residual 60% distal RCA; nl EF  b. Nuc 02/2016 aborted due to bradycardia. Will need to be rescheduled   . Carotid artery occlusion   . Cerebrovascular disease   . CHB (complete heart block) ([place])    a. s/p PPM on 03/24/16 with a St. Jude (serial number [id number]) pacemaker  . Chronic kidney disease (CKD), stage III (moderate) (H[facility])   . Clostridium difficile colitis 03/2013   a. 03/2013  . COPD (chronic obstructive pulmonary disease) ([place])   . Coronary disease   . Diabetes mellitus   . Diverticulosis   . GERD (gastroesophageal reflux disease)   . Gout   . H pylori ulcer   . Hyperlipidemia    chose to stop statin, takes Zetia  . Hypertension    not currently on medication  . IBS (irritable bowel syndrome)   . PVD (peripheral vascular disease) ([place])    a. moderate to severely decreased ABI's in 8/08, sig. aortic inflow disease b. repeat ABI's in 2011 improved- mild disease on the left and moderate to severe disease on the right  . S/P placement of cardiac pacemaker    a. 03/24/16: St. Jude (serial number [id number]) pacemaker  . Symptomatic anemia 08/15/2016  . TIA (transient ischemic attack) 2012  . Tobacco abuse    a. 50 pack years continuing at 1/2 pack per day  . Tobacco use disorder 10/15/2015    Past Surgical History:  Procedure Laterality Date  . ABDOMINAL HYSTERECTOMY    . carpel tunnel release     bilateral  . CHOLECYSTECTOMY    . COLONOSCOPY  2009  . COLONOSCOPY N/A 08/04/2016   incomplete due to prep.   . COLONOSCOPY N/A 08/05/2016   Dr.  Oneida Alar: moderately redundant rectosigmoid and sigmoid, normal TI, single large-mouthed diverticulum in hepatic flexure, external hemorrhoids, path negative for microscopic colitis   . ENTEROSCOPY N/A 08/18/2016   Procedure: ENTEROSCOPY;  Surgeon: Daneil Dolin, MD;  [facility];  Service: Endoscopy;  Laterality: N/A;  Pediatric colonoscope  . EP IMPLANTABLE DEVICE N/A 03/24/2016   Procedure: Pacemaker Implant;  Surgeon: [name], MD;  Location: [facility];  Service: Cardiovascular;  Laterality: N/A;  . ESOPHAGOGASTRODUODENOSCOPY N/A 08/04/2016   Dr. Oneida Alar: non-critical Schatzki's ring, small hiatal hernia, +H.pylori gastritis on path.   Marland Kitchen  GIVENS CAPSULE STUDY N/A 08/16/2016   Procedure: GIVENS CAPSULE STUDY;  Surgeon: Daneil Dolin, MD;  [facility];  Service: Endoscopy;  Laterality: N/A;  . TOTAL ABDOMINAL HYSTERECTOMY W/ BILATERAL SALPINGOOPHORECTOMY      Social History   Social History  . Marital status: Widowed    Spouse name: N/A  . Number of children: N/A  . Years of education: N/A   Occupational History  . Not on file.   Social History Main Topics  . Smoking status: Current Every Day Smoker    Packs/day: 1.00    Years: 50.00    Types: Cigarettes    Start date: 02/02/1966  . Smokeless tobacco: Never Used     Comment: 1/2 pack daily  . Alcohol use No     Comment: H/O of 6 pack per day quiting in 2003  . Drug use: No  . Sexual activity: Not Currently   Other Topics Concern  . Not on file   Social History Narrative  . No narrative on file     Vitals:   03/26/17 1255  BP: 132/66  Pulse: 60  SpO2: 99%  Weight: 146 lb (66.2 kg)  Height: 5\' 7"  (1.702 m)    Wt Readings from Last 3 Encounters:  03/26/17 146 lb (66.2 kg)  02/26/17 141 lb 3.2 oz (64 kg)  01/31/17 142 lb 3.2 oz (64.5 kg)     PHYSICAL EXAM General: NAD HEENT: Normal. Neck: No JVD, no thyromegaly. Lungs: Clear to auscultation bilaterally with normal  respiratory effort. CV: Regular rate and rhythm, normal S1/S2, no S3/S4, no murmur. No pretibial or periankle edema.  No carotid bruit.   Abdomen: Soft, nontender, no distention.  Neurologic: Alert and oriented.  Psych: Normal affect. Skin: Normal. Musculoskeletal: No gross deformities.    ECG: Most recent ECG reviewed.   Labs: Lab Results  Component Value Date/Time   K 3.9 12/07/2016 12:06 PM   BUN 18 12/07/2016 12:06 PM   CREATININE 0.94 12/07/2016 12:06 PM   CREATININE 1.05 (H) 08/09/2016 02:39 PM   ALT 9 (L) 12/07/2016 12:06 PM   TSH 1.718 08/02/2016 05:05 PM   TSH 1.19 07/03/2016 03:19 PM   HGB 10.9 (L) 02/22/2017 02:23 PM     Lipids: Lab Results  Component Value Date/Time   LDLCALC 92 12/22/2016 08:19 AM   CHOL 160 12/22/2016 08:19 AM   TRIG 127 12/22/2016 08:19 AM   TRIG 112 08/11/2008   HDL 43 (L) 12/22/2016 08:19 AM       ASSESSMENT AND PLAN: 1. CAD: Symptomatically stable. No longer on ASA or statin. Maintained on Zetia.   2. Essential HTN: Controlled. No changes.  3. Hyperlipidemia: Lipids reviewed above. Maintained on Zetia. Will provide a one year supply.   4. CHB s/p PPM: Stable with normal device function. Routine monitoring in device clinic.  5. Peripheral vascular disease: Followed by vascular surgery. To be seen in 07/2017. No longer on  ASA and statin. Tobacco cessation counseling previously given. Continues to smoke 1 ppd.  6. Tobacco abuse disorder: Tobacco cessation counseling previously given. Continues to smoke 1 ppd.  7. PAF: Not an anticoagulation candidate.      Disposition: Follow up 1 year.   Kate Sable, M.D., F.A.C.C.

## 2017-04-06 ENCOUNTER — Ambulatory Visit (INDEPENDENT_AMBULATORY_CARE_PROVIDER_SITE_OTHER): Payer: Medicare HMO | Admitting: Internal Medicine

## 2017-04-06 ENCOUNTER — Encounter: Payer: Self-pay | Admitting: Internal Medicine

## 2017-04-06 VITALS — BP 144/74 | Ht 65.0 in | Wt 143.0 lb

## 2017-04-06 DIAGNOSIS — I10 Essential (primary) hypertension: Secondary | ICD-10-CM | POA: Diagnosis not present

## 2017-04-06 DIAGNOSIS — I442 Atrioventricular block, complete: Secondary | ICD-10-CM | POA: Diagnosis not present

## 2017-04-06 LAB — CUP PACEART INCLINIC DEVICE CHECK
Battery Remaining Longevity: 110 mo
Battery Voltage: 2.99 V
Implantable Lead Location: 753859
Implantable Pulse Generator Implant Date: 20171020
Lead Channel Impedance Value: 425 Ohm
Lead Channel Pacing Threshold Amplitude: 0.5 V
Lead Channel Pacing Threshold Amplitude: 0.75 V
Lead Channel Pacing Threshold Pulse Width: 0.4 ms
Lead Channel Pacing Threshold Pulse Width: 0.4 ms
Lead Channel Sensing Intrinsic Amplitude: 1.8 mV
Lead Channel Setting Pacing Amplitude: 2.5 V
Lead Channel Setting Pacing Pulse Width: 0.4 ms
MDC IDC LEAD IMPLANT DT: 20171020
MDC IDC LEAD IMPLANT DT: 20171020
MDC IDC LEAD LOCATION: 753860
MDC IDC MSMT LEADCHNL RA PACING THRESHOLD AMPLITUDE: 0.5 V
MDC IDC MSMT LEADCHNL RA PACING THRESHOLD PULSEWIDTH: 0.4 ms
MDC IDC MSMT LEADCHNL RA PACING THRESHOLD PULSEWIDTH: 0.4 ms
MDC IDC MSMT LEADCHNL RV IMPEDANCE VALUE: 487.5 Ohm
MDC IDC MSMT LEADCHNL RV PACING THRESHOLD AMPLITUDE: 0.75 V
MDC IDC MSMT LEADCHNL RV SENSING INTR AMPL: 9.6 mV
MDC IDC PG SERIAL: 3180497
MDC IDC SESS DTM: 20181102120238
MDC IDC SET LEADCHNL RA PACING AMPLITUDE: 2 V
MDC IDC SET LEADCHNL RV SENSING SENSITIVITY: 3.5 mV
MDC IDC STAT BRADY RA PERCENT PACED: 44 %
MDC IDC STAT BRADY RV PERCENT PACED: 99.79 %

## 2017-04-06 NOTE — Patient Instructions (Signed)
Medication Instructions:  Your physician recommends that you continue on your current medications as directed. Please refer to the Current Medication list given to you today.   Labwork: NONE   Testing/Procedures: NONE   Follow-Up: Your physician wants you to follow-up in: 1 Year with Dr. Taylor. You will receive a reminder letter in the mail two months in advance. If you don't receive a letter, please call our office to schedule the follow-up appointment.   Any Other Special Instructions Will Be Listed Below (If Applicable).     If you need a refill on your cardiac medications before your next appointment, please call your pharmacy.  Thank you for choosing [facility]!   

## 2017-04-06 NOTE — Progress Notes (Signed)
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 She has been stable in the interim, with no chest pain or shortness of breath. She does have claudication and sees Dr. Scot Dock and will do so again in approximately 6 weeks. She denies anginal symptoms. She has not had syncope. She still smokes cigarettes. Allergies  Allergen Reactions  . Ace Inhibitors Hives, Shortness Of Breath and Swelling  . Aspirin Other (See Comments)    Blood in stool and nose bleed  . Codeine Swelling  . Pineapple Shortness Of Breath and Swelling  . Plasticized Base [Plastibase] Hives and Swelling    No plastics  . Ultram [Tramadol] Swelling  . Adhesive [Tape] Swelling and Rash  . Naprosyn [Naproxen] Swelling  . Biaxin [Clarithromycin] Nausea And Vomiting and Other (See Comments)    Dizziness.  . Clindamycin/Lincomycin Other (See Comments)    Burning sensation throat, abdomen  . Linzess [Linaclotide]     DIARRHEA, FECAL INCONTINENCE  . Metronidazole     NAUSEA AND WEAKNESS  . Nsaids   . Penicillins     Lip swelling, Hives Has patient had a PCN reaction causing immediate rash, facial/tongue/throat swelling, SOB or lightheadedness with hypotension:YES Has patient had a PCN reaction causing severe rash involving mucus membranes or skin necrosis: NO Has patient had a PCN reaction that required hospitalization NO Has patient had a PCN reaction occurring within the last 10 years: NO If all of the above answers are "NO", then may proceed with Cephalosporin use.  . Varenicline Tartrate Swelling     Current Outpatient Prescriptions  Medication Sig Dispense Refill  . acetaminophen (TYLENOL) 500 MG tablet Take 500 mg by mouth every 6 (six) hours as needed for mild pain or moderate pain.    Marland Kitchen albuterol (PROVENTIL HFA;VENTOLIN HFA) 108 (90 Base) MCG/ACT inhaler Inhale 2 puffs into the lungs every 4 (four) hours as needed for wheezing  or shortness of breath. 1 Inhaler 2  . ALPRAZolam (XANAX) 0.25 MG tablet Take 1 tablet (0.25 mg total) by mouth at bedtime as needed. 30 tablet 1  . calcium citrate-vitamin D (CITRACAL+D) 315-200 MG-UNIT per tablet Take 1 tablet by mouth daily.      . Cranberry 250 MG CAPS Take 250 mg by mouth daily.    . Cyanocobalamin (VITAMIN B 12 PO) Take 1 tablet by mouth daily.     Marland Kitchen esomeprazole (NEXIUM) 40 MG capsule Take 1 capsule (40 mg total) by mouth 2 (two) times daily before a meal. (Patient taking differently: Take 40 mg by mouth daily. ) 60 capsule 0  . ezetimibe (ZETIA) 10 MG tablet Take 1 tablet (10 mg total) by mouth daily. 90 tablet 3  . ferrous sulfate 325 (65 FE) MG tablet Take 325 mg by mouth daily with breakfast.    . Fluticasone-Salmeterol (ADVAIR) 100-50 MCG/DOSE AEPB Inhale 1 puff into the lungs 2 (two) times daily. (Patient taking differently: Inhale 1 puff into the lungs as needed. ) 1 each 3  . gabapentin (NEURONTIN) 100 MG capsule TAKE 1 TO 2 CAPSULES BY MOUTH AT BEDTIME FOR NERVE PAIN. 60 capsule 6  . lubiprostone (AMITIZA) 8 MCG capsule Take 8 mcg by mouth as needed.     . mirabegron ER (MYRBETRIQ) 25 MG TB24 tablet Take 1 tablet (25 mg total) by mouth at bedtime. For bladder (Patient taking differently: Take 25 mg by mouth as needed. For bladder) 30 tablet 6  .  Multiple Vitamin (MULTIVITAMIN WITH MINERALS) TABS tablet Take 1 tablet by mouth daily.    . Probiotic Product (PROBIOTIC DAILY PO) Take 1 capsule by mouth daily.     . promethazine (PHENERGAN) 12.5 MG tablet 1-2 po 30 MINS PRIOR TO MEDS OR MEALS. MAY REPEAT EVERY 4-6 h prn nausea or vomiting 40 tablet 1  . Tiotropium Bromide Monohydrate (SPIRIVA RESPIMAT) 1.25 MCG/ACT AERS Inhale 2 puffs into the lungs daily. (Patient taking differently: Inhale 2 puffs into the lungs as needed. ) 1 Inhaler 11   No current facility-administered medications for this visit.      Past Medical History:  Diagnosis Date  . 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 ASCVD (arteriosclerotic cardiovascular disease)    a. cath in 4/04,-80%LAD; 70% RCA, -> DESx2; residual 60% distal RCA; nl EF  b. Nuc 02/2016 aborted due to bradycardia. Will need to be rescheduled   . Carotid artery occlusion   . Cerebrovascular disease   . CHB (complete heart block) ([place])    a. s/p PPM on 03/24/16 with a St. Jude (serial number [id number]) pacemaker  . Chronic kidney disease (CKD), stage III (moderate) ([facility])   . Clostridium difficile colitis 03/2013   a. 03/2013  . COPD (chronic obstructive pulmonary disease) ([place])   . Coronary disease   . Diabetes mellitus   . Diverticulosis   . GERD (gastroesophageal reflux disease)   . Gout   . H pylori ulcer   . Hyperlipidemia    chose to stop statin, takes Zetia  . Hypertension    not currently on medication  . IBS (irritable bowel syndrome)   . PVD (peripheral vascular disease) ([place])    a. moderate to severely decreased ABI's in 8/08, sig. aortic inflow disease b. repeat ABI's in 2011 improved- mild disease on the left and moderate to severe disease on the right  . S/P placement of cardiac pacemaker    a. 03/24/16: St. Jude (serial number [id number]) pacemaker  . Symptomatic anemia 08/15/2016  . TIA (transient ischemic attack) 2012  . Tobacco abuse    a. 50 pack years continuing at 1/2 pack per day  . Tobacco use disorder 10/15/2015    ROS:   All systems reviewed and negative except as noted in the HPI.   Past Surgical History:  Procedure Laterality Date  . ABDOMINAL HYSTERECTOMY    . carpel tunnel release     bilateral  . CHOLECYSTECTOMY    . COLONOSCOPY  2009  . COLONOSCOPY N/A 08/04/2016   incomplete due to prep.   . COLONOSCOPY N/A 08/05/2016   Dr. Oneida Alar: moderately redundant rectosigmoid and sigmoid, normal TI, single large-mouthed diverticulum in hepatic flexure, external hemorrhoids, path negative for microscopic colitis   . ENTEROSCOPY N/A 08/18/2016   Procedure: ENTEROSCOPY;   Surgeon: Daneil Dolin, MD;  [facility];  Service: Endoscopy;  Laterality: N/A;  Pediatric colonoscope  . EP IMPLANTABLE DEVICE N/A 03/24/2016   Procedure: Pacemaker Implant;  Surgeon: [name], MD;  Location: [facility];  Service: Cardiovascular;  Laterality: N/A;  . ESOPHAGOGASTRODUODENOSCOPY N/A 08/04/2016   Dr. Oneida Alar: non-critical Schatzki's ring, small hiatal hernia, +H.pylori gastritis on path.   Marland Kitchen GIVENS CAPSULE STUDY N/A 08/16/2016   Procedure: GIVENS CAPSULE STUDY;  Surgeon: Daneil Dolin, MD;  Location: [facility];  Service: Endoscopy;  Laterality: N/A;  . TOTAL ABDOMINAL HYSTERECTOMY W/ BILATERAL SALPINGOOPHORECTOMY       Family History  Problem Relation Age of Onset  .  Liver disease Mother 79  . Hypertension Mother   . Cerebral aneurysm Father   . Aneurysm Father 43  . Cancer Sister   . Liver disease Brother   . Liver disease Brother   . Liver disease Brother   . Diabetes type II Brother   . Alcohol abuse Brother   . Cancer Sister   . Colon cancer Neg Hx      Social History   Social History  . Marital status: Widowed    Spouse name: N/A  . Number of children: N/A  . Years of education: N/A   Occupational History  . Not on file.   Social History Main Topics  . Smoking status: Current Every Day Smoker    Packs/day: 1.00    Years: 50.00    Types: Cigarettes    Start date: 02/02/1966  . Smokeless tobacco: Never Used     Comment: 1/2 pack daily  . Alcohol use No     Comment: H/O of 6 pack per day quiting in 2003  . Drug use: No  . Sexual activity: Not Currently   Other Topics Concern  . Not on file   Social History Narrative  . No narrative on file     BP (!) 144/74 (BP Location: Left Arm)   Ht 5\' 5"  (1.651 m)   Wt 143 lb (64.9 kg)   SpO2 92%   BMI 23.80 kg/m   Physical Exam:  Well appearing 76 year old woman, NAD HEENT: Unremarkable Neck:  6 cm JVD, no thyromegally Lymphatics:  No adenopathy Back:  No CVA  tenderness Lungs:  Clear, with no wheezes, rales, or rhonchi HEART:  Regular rate rhythm, no murmurs, no rubs, no clicks Abd:  soft, positive bowel sounds, no organomegally, no rebound, no guarding Ext: Trace peripheral pulses, no edema, no cyanosis, no clubbing Skin:  No rashes no nodules Neuro:  CN II through XII intact, motor grossly intact   DEVICE  Normal device function.  See PaceArt for details.   Assess/Plan: 1. Complete heart block - she is stable and asymptomatic status post pacemaker insertion 2. Pacemaker -her Medtronic dual-chamber pacemaker is working normally. We'll plan to recheck in several months. 3. Hypertension - her systolic blood pressure is well controlled. I've encouraged the patient to reduce her salt intake. 4. Tobacco abuse - I strongly encouraged the patient to stop smoking. She has claudication and will follow this up with Dr. Scot Dock.  Cristopher Peru, M.D.

## 2017-05-08 ENCOUNTER — Telehealth: Payer: Self-pay | Admitting: Family Medicine

## 2017-05-08 NOTE — Telephone Encounter (Signed)
CB# [phone]  Patient called to see if we had anymore ensure for her. She was going to send her son up here to pick up.

## 2017-05-08 NOTE — Telephone Encounter (Signed)
Call placed to patient.   Advised that we do not have Ensure at this time. Will call once supply is re-stocked.

## 2017-05-09 ENCOUNTER — Encounter (HOSPITAL_COMMUNITY): Payer: Commercial Managed Care - HMO

## 2017-05-09 ENCOUNTER — Ambulatory Visit: Payer: Commercial Managed Care - HMO | Admitting: Vascular Surgery

## 2017-06-06 ENCOUNTER — Encounter: Payer: Self-pay | Admitting: Gastroenterology

## 2017-06-06 ENCOUNTER — Other Ambulatory Visit: Payer: Self-pay | Admitting: Internal Medicine

## 2017-06-12 ENCOUNTER — Other Ambulatory Visit: Payer: Self-pay | Admitting: Gastroenterology

## 2017-06-18 ENCOUNTER — Ambulatory Visit: Payer: Medicare HMO | Admitting: Family Medicine

## 2017-06-18 ENCOUNTER — Other Ambulatory Visit (HOSPITAL_COMMUNITY): Payer: Self-pay | Admitting: *Deleted

## 2017-06-18 DIAGNOSIS — D649 Anemia, unspecified: Secondary | ICD-10-CM

## 2017-06-19 ENCOUNTER — Inpatient Hospital Stay (HOSPITAL_COMMUNITY): Payer: Medicare HMO | Attending: Oncology

## 2017-06-19 DIAGNOSIS — E611 Iron deficiency: Secondary | ICD-10-CM | POA: Diagnosis not present

## 2017-06-19 DIAGNOSIS — D649 Anemia, unspecified: Secondary | ICD-10-CM | POA: Diagnosis not present

## 2017-06-19 LAB — FERRITIN: FERRITIN: 285 ng/mL (ref 11–307)

## 2017-06-19 LAB — CBC WITH DIFFERENTIAL/PLATELET
Basophils Absolute: 0 10*3/uL (ref 0.0–0.1)
Basophils Relative: 1 %
EOS ABS: 0.1 10*3/uL (ref 0.0–0.7)
Eosinophils Relative: 1 %
HCT: 35.7 % — ABNORMAL LOW (ref 36.0–46.0)
HEMOGLOBIN: 11.6 g/dL — AB (ref 12.0–15.0)
LYMPHS ABS: 2.2 10*3/uL (ref 0.7–4.0)
Lymphocytes Relative: 32 %
MCH: 29.9 pg (ref 26.0–34.0)
MCHC: 32.5 g/dL (ref 30.0–36.0)
MCV: 92 fL (ref 78.0–100.0)
MONOS PCT: 8 %
Monocytes Absolute: 0.5 10*3/uL (ref 0.1–1.0)
NEUTROS PCT: 58 %
Neutro Abs: 4.1 10*3/uL (ref 1.7–7.7)
Platelets: 210 10*3/uL (ref 150–400)
RBC: 3.88 MIL/uL (ref 3.87–5.11)
RDW: 13.8 % (ref 11.5–15.5)
WBC: 6.9 10*3/uL (ref 4.0–10.5)

## 2017-06-19 LAB — IRON AND TIBC
IRON: 64 ug/dL (ref 28–170)
Saturation Ratios: 29 % (ref 10.4–31.8)
TIBC: 224 ug/dL — AB (ref 250–450)
UIBC: 160 ug/dL

## 2017-06-19 LAB — COMPREHENSIVE METABOLIC PANEL
ALK PHOS: 49 U/L (ref 38–126)
ALT: 12 U/L — AB (ref 14–54)
AST: 22 U/L (ref 15–41)
Albumin: 4 g/dL (ref 3.5–5.0)
Anion gap: 10 (ref 5–15)
BUN: 20 mg/dL (ref 6–20)
CALCIUM: 9.9 mg/dL (ref 8.9–10.3)
CO2: 27 mmol/L (ref 22–32)
CREATININE: 1.03 mg/dL — AB (ref 0.44–1.00)
Chloride: 102 mmol/L (ref 101–111)
GFR, EST AFRICAN AMERICAN: 59 mL/min — AB (ref >60)
GFR, EST NON AFRICAN AMERICAN: 51 mL/min — AB (ref >60)
Glucose, Bld: 114 mg/dL — ABNORMAL HIGH (ref 65–99)
Potassium: 4.3 mmol/L (ref 3.5–5.1)
SODIUM: 139 mmol/L (ref 135–145)
TOTAL PROTEIN: 7.6 g/dL (ref 6.5–8.1)
Total Bilirubin: 0.4 mg/dL (ref 0.3–1.2)

## 2017-06-20 ENCOUNTER — Encounter: Payer: Self-pay | Admitting: Family Medicine

## 2017-06-20 ENCOUNTER — Ambulatory Visit (INDEPENDENT_AMBULATORY_CARE_PROVIDER_SITE_OTHER): Payer: Medicare HMO | Admitting: Family Medicine

## 2017-06-20 ENCOUNTER — Other Ambulatory Visit: Payer: Self-pay

## 2017-06-20 VITALS — BP 142/78 | HR 62 | Temp 98.3°F | Resp 14 | Ht 65.0 in | Wt 142.0 lb

## 2017-06-20 DIAGNOSIS — M170 Bilateral primary osteoarthritis of knee: Secondary | ICD-10-CM

## 2017-06-20 DIAGNOSIS — R2681 Unsteadiness on feet: Secondary | ICD-10-CM

## 2017-06-20 DIAGNOSIS — M7062 Trochanteric bursitis, left hip: Secondary | ICD-10-CM

## 2017-06-20 LAB — ERYTHROPOIETIN: Erythropoietin: 9.4 m[IU]/mL (ref 2.6–18.5)

## 2017-06-20 MED ORDER — METHYLPREDNISOLONE 4 MG PO TBPK: ORAL_TABLET | ORAL | 0 refills | Status: DC

## 2017-06-20 NOTE — Progress Notes (Signed)
   Subjective:    Patient ID: Charlotte Henry, female    DOB: 11/17/1940, 77 y.o.   MRN: [id number]  Patient presents for L Hip Pain and R Knee Pain   Pt here with joint pain all over. Left hip is the worst, and right knee past few weeks. When she lays on left side has pain.Right does swell up. Has not given out on her. It takes her a while to get  Moving.     Xrays in  2013 of knees showed DJD   Taking tylenol with some improvement   Reviewed labs from cancer center  States some days she just feels bad and cant move well, this makes her depressed, her appetite is always up and down, does not have ensure right now,   Review Of Systems:  GEN- denies fatigue, fever, weight loss,weakness, recent illness HEENT- denies eye drainage, change in vision, nasal discharge, CVS- denies chest pain, palpitations RESP- denies SOB, cough, wheeze ABD- denies N/V, change in stools, abd pain GU- denies dysuria, hematuria, dribbling, incontinence MSK- +joint pain, muscle aches, injury Neuro- denies headache, dizziness, syncope, seizure activity       Objective:    BP (!) 142/78   Pulse 62   Temp 98.3 F (36.8 C) (Oral)   Resp 14   Ht 5\' 5"  (1.651 m)   Wt 142 lb (64.4 kg)   SpO2 96%   BMI 23.63 kg/m  GEN- NAD, alert and oriented x3,unsteady gait  HEENT- PERRL, EOMI, non injected sclera, pink conjunctiva, MMM, oropharynx clear CVS- RRR, 2/6 SEM RESP-CTAB ABD-NABS,soft,NT,ND Psych- Normal affect and mood MSK- fair ROM bilat knees, crepitus, no effusion, pain with IR/ER of left hip, TTP over trochanter, mild antalgic gait, using cane  EXT- No edema Pulses- Radial  2+        Assessment & Plan:      Problem List Items Addressed This Visit      Unprioritized   OA (osteoarthritis)   Relevant Medications   methylPREDNISolone (MEDROL DOSEPAK) 4 MG TBPK tablet   Gait instability    Other Visit Diagnoses    Trochanteric bursitis of left hip    -  Primary   Concern for bursitis,  likley has some OA as well, discussed ortho and getting hip injection she declines, will give medrol dosepak, tylenol for pain She can also use her topical NSAID on hip and knees Prefer she use her walker instead of cane for better stability   She is going to get more ensure or boost to supplement appetite  Reviewed labs from oncology- labs improved for anemia, renal function stable      Note: This dictation was prepared with Dragon dictation along with smaller phrase technology. Any transcriptional errors that result from this process are unintentional.

## 2017-06-20 NOTE — Patient Instructions (Addendum)
Take the steroids Take 1 tylenol twice a day  Use the topical rub Call me if not better F/U 3 months

## 2017-06-26 ENCOUNTER — Inpatient Hospital Stay (HOSPITAL_COMMUNITY): Payer: Medicare HMO | Admitting: Oncology

## 2017-07-09 ENCOUNTER — Ambulatory Visit (INDEPENDENT_AMBULATORY_CARE_PROVIDER_SITE_OTHER): Payer: Medicare HMO | Admitting: *Deleted

## 2017-07-09 DIAGNOSIS — I442 Atrioventricular block, complete: Secondary | ICD-10-CM | POA: Diagnosis not present

## 2017-07-09 NOTE — Progress Notes (Signed)
Remote pacemaker transmission.   

## 2017-07-10 ENCOUNTER — Encounter: Payer: Self-pay | Admitting: Cardiology

## 2017-07-11 ENCOUNTER — Encounter (HOSPITAL_COMMUNITY): Payer: Medicare HMO

## 2017-07-11 ENCOUNTER — Ambulatory Visit: Payer: Commercial Managed Care - HMO | Admitting: Vascular Surgery

## 2017-07-13 ENCOUNTER — Telehealth: Payer: Self-pay | Admitting: Family Medicine

## 2017-07-13 NOTE — Telephone Encounter (Signed)
Pt called states that she is having itching all over was wanting an app for today and did not want to schedule for Monday. Please call her back and let her know what she can do at home if anything (she did not want to speak with tiffany in triage)

## 2017-07-15 NOTE — Progress Notes (Signed)
Charlotte Henry, Charlotte Henry [id number]   CLINIC:  Medical Oncology/Hematology  PCP:  Alycia Rossetti, MD [address] [place] [address] [phone]   REASON FOR VISIT:  Follow-up for Anemia   CURRENT THERAPY: IV iron PRN; oral iron and vitamin B12 supplementation    HISTORY OF PRESENT ILLNESS:  (From Dr. Laverle Patter note on 02/22/17)      INTERVAL HISTORY:  [name] 77 y.o. female returns for routine follow-up for anemia.   Overall, she tells me she has been feeling "okay." Appetite 50%; energy levels 25%. Denies any pain.    Denies any frank bleeding episodes including blood in her stools, hematuria, vaginal bleeding, nosebleeds, or gingival bleeding.  She tells me she is taking oral iron and oral vitamin B12 supplements.   Her biggest concern today is decreased appetite and significant weight loss over the past couple of years. She tells me that she weighed ~216 lbs in 2017. Today, she weighed ~145 lbs.  She tries to drink Glucerna twice daily.  She is not sure what is making her have decreased appetite. She feels nauseated at times before meals   She does have heart failure and a pacemaker.  She has (L) chest wall itching; she tells me that she called her PCP but has not received a return call yet.  She is taking Benadryl for the itching.     REVIEW OF SYSTEMS:  Review of Systems  Constitutional: Positive for appetite change, fatigue and unexpected weight change. Negative for chills and fever.  HENT:  Negative.   Eyes: Negative.   Respiratory: Negative.   Cardiovascular: Negative.   Gastrointestinal: Positive for nausea. Negative for blood in stool and vomiting.  Endocrine: Negative.   Genitourinary: Negative.    Musculoskeletal: Negative.   Skin: Positive for itching.  Neurological: Positive for numbness.  Hematological: Negative.   Psychiatric/Behavioral: Negative.      PAST MEDICAL/SURGICAL HISTORY:  Past Medical  History:  Diagnosis Date  . Arterial fibromuscular dysplasia ([place])   . ASCVD (arteriosclerotic cardiovascular disease)    a. cath in 4/04,-80%LAD; 70% RCA, -> DESx2; residual 60% distal RCA; nl EF  b. Nuc 02/2016 aborted due to bradycardia. Will need to be rescheduled   . Carotid artery occlusion   . Cerebrovascular disease   . CHB (complete heart block) ([place])    a. s/p PPM on 03/24/16 with a St. Jude (serial number [id number]) pacemaker  . Chronic kidney disease (CKD), stage III (moderate) ([facility])   . Clostridium difficile colitis 03/2013   a. 03/2013  . COPD (chronic obstructive pulmonary disease) ([facility] [facility])   . Coronary disease   . Diabetes mellitus   . Diverticulosis   . GERD (gastroesophageal reflux disease)   . Gout   . H pylori ulcer   . Hyperlipidemia    chose to stop statin, takes Zetia  . Hypertension    not currently on medication  . IBS (irritable bowel syndrome)   . PVD (peripheral vascular disease) ([place])    a. moderate to severely decreased ABI's in 8/08, sig. aortic inflow disease b. repeat ABI's in 2011 improved- mild disease on the left and moderate to severe disease on the right  . S/P placement of cardiac pacemaker    a. 03/24/16: St. Jude (serial number [id number]) pacemaker  . Symptomatic anemia 08/15/2016  . TIA (transient ischemic attack) 2012  . Tobacco abuse    a. 50 pack years continuing at 1/2  pack per day  . Tobacco use disorder 10/15/2015   Past Surgical History:  Procedure Laterality Date  . ABDOMINAL HYSTERECTOMY    . carpel tunnel release     bilateral  . CHOLECYSTECTOMY    . COLONOSCOPY  2009  . COLONOSCOPY N/A 08/04/2016   incomplete due to prep.   . COLONOSCOPY N/A 08/05/2016   Dr. Oneida Alar: moderately redundant rectosigmoid and sigmoid, normal TI, single large-mouthed diverticulum in hepatic flexure, external hemorrhoids, path negative for microscopic colitis   . ENTEROSCOPY N/A 08/18/2016   Procedure: ENTEROSCOPY;  Surgeon: Daneil Dolin, MD;  Location: [facility]  END[facility];  Service: Endoscopy;  Laterality: N/A;  Pediatric colonoscope  . EP IMPLANTABLE DEVICE N/A 03/24/2016   Procedure: Pacemaker Implant;  Surgeon: [name], MD;  Location: [facility];  Service: Cardiovascular;  Laterality: N/A;  . ESOPHAGOGASTRODUODENOSCOPY N/A 08/04/2016   Dr. Oneida Alar: non-critical Schatzki's ring, small hiatal hernia, +H.pylori gastritis on path.   Marland Kitchen GIVENS CAPSULE STUDY N/A 08/16/2016   Procedure: GIVENS CAPSULE STUDY;  Surgeon: Daneil Dolin, MD;  Location: [facility];  Service: Endoscopy;  Laterality: N/A;  . TOTAL ABDOMINAL HYSTERECTOMY W/ BILATERAL SALPINGOOPHORECTOMY       SOCIAL HISTORY:  Social History   Socioeconomic History  . Marital status: Widowed    Spouse name: Not on file  . Number of children: Not on file  . Years of education: Not on file  . Highest education level: Not on file  Social Needs  . Financial resource strain: Not on file  . Food insecurity - worry: Not on file  . Food insecurity - inability: Not on file  . Transportation needs - medical: Not on file  . Transportation needs - non-medical: Not on file  Occupational History  . Not on file  Tobacco Use  . Smoking status: Current Every Day Smoker    Packs/day: 1.00    Years: 50.00    Pack years: 50.00    Types: Cigarettes    Start date: 02/02/1966  . Smokeless tobacco: Never Used  . Tobacco comment: 1/2 pack daily  Substance and Sexual Activity  . Alcohol use: No    Alcohol/week: 0.0 oz    Comment: H/O of 6 pack per day quiting in 2003  . Drug use: No  . Sexual activity: Not Currently  Other Topics Concern  . Not on file  Social History Narrative  . Not on file    FAMILY HISTORY:  Family History  Problem Relation Age of Onset  . Liver disease Mother 72  . Hypertension Mother   . Cerebral aneurysm Father   . Aneurysm Father 72  . Cancer Sister   . Liver disease Brother   . Liver disease Brother   . Liver disease Brother   . Diabetes type II  Brother   . Alcohol abuse Brother   . Cancer Sister   . Colon cancer Neg Hx     CURRENT MEDICATIONS:  Outpatient Encounter Medications as of 07/17/2017  Medication Sig Note  . acetaminophen (TYLENOL) 500 MG tablet Take 500 mg by mouth every 6 (six) hours as needed for mild pain or moderate pain.   Marland Kitchen albuterol (PROVENTIL HFA;VENTOLIN HFA) 108 (90 Base) MCG/ACT inhaler Inhale 2 puffs into the lungs every 4 (four) hours as needed for wheezing or shortness of breath.   . ALPRAZolam (XANAX) 0.25 MG tablet Take 1 tablet (0.25 mg total) by mouth at bedtime as needed.   . calcium citrate-vitamin  D (CITRACAL+D) 315-200 MG-UNIT per tablet Take 1 tablet by mouth daily.     . Cranberry 250 MG CAPS Take 250 mg by mouth daily.   . Cyanocobalamin (VITAMIN B 12 PO) Take 1 tablet by mouth daily.    Marland Kitchen esomeprazole (NEXIUM) 40 MG capsule TAKE 1 CAPSULE BY MOUTH DAILY BEFORE BREAKFAST.   Marland Kitchen ezetimibe (ZETIA) 10 MG tablet Take 1 tablet (10 mg total) by mouth daily.   . ferrous sulfate 325 (65 FE) MG tablet Take 325 mg by mouth daily with breakfast.   . Fluticasone-Salmeterol (ADVAIR) 100-50 MCG/DOSE AEPB Inhale 1 puff into the lungs 2 (two) times daily. (Patient taking differently: Inhale 1 puff into the lungs as needed. )   . gabapentin (NEURONTIN) 100 MG capsule TAKE 1 TO 2 CAPSULES BY MOUTH AT BEDTIME FOR NERVE PAIN.   Marland Kitchen lubiprostone (AMITIZA) 8 MCG capsule Take 8 mcg by mouth as needed.    . methylPREDNISolone (MEDROL DOSEPAK) 4 MG TBPK tablet Take as directed on package (Patient not taking: Reported on 07/19/2017)   . mirabegron ER (MYRBETRIQ) 25 MG TB24 tablet Take 1 tablet (25 mg total) by mouth at bedtime. For bladder (Patient taking differently: Take 25 mg by mouth as needed. For bladder) 07/04/2016: Patient states hasn't taken in a week or two.  . Multiple Vitamin (MULTIVITAMIN WITH MINERALS) TABS tablet Take 1 tablet by mouth daily.   . Probiotic Product (PROBIOTIC DAILY PO) Take 1 capsule by mouth daily.     . prochlorperazine (COMPAZINE) 10 MG tablet Take 1 tablet (10 mg total) by mouth every 6 (six) hours as needed for nausea or vomiting.   . promethazine (PHENERGAN) 12.5 MG tablet 1-2 po 30 MINS PRIOR TO MEDS OR MEALS. MAY REPEAT EVERY 4-6 h prn nausea or vomiting   . Tiotropium Bromide Monohydrate (SPIRIVA RESPIMAT) 1.25 MCG/ACT AERS Inhale 2 puffs into the lungs daily. (Patient taking differently: Inhale 2 puffs into the lungs as needed. )    No facility-administered encounter medications on file as of 07/17/2017.     ALLERGIES:  Allergies  Allergen Reactions  . Ace Inhibitors Hives, Shortness Of Breath and Swelling  . Aspirin Other (See Comments)    Blood in stool and nose bleed  . Codeine Swelling  . Pineapple Shortness Of Breath and Swelling  . Plasticized Base [Plastibase] Hives and Swelling    No plastics  . Ultram [Tramadol] Swelling  . Adhesive [Tape] Swelling and Rash  . Naprosyn [Naproxen] Swelling  . Biaxin [Clarithromycin] Nausea And Vomiting and Other (See Comments)    Dizziness.  . Clindamycin/Lincomycin Other (See Comments)    Burning sensation throat, abdomen  . Linzess [Linaclotide]     DIARRHEA, FECAL INCONTINENCE  . Metronidazole     NAUSEA AND WEAKNESS  . Nsaids   . Penicillins     Lip swelling, Hives Has patient had a PCN reaction causing immediate rash, facial/tongue/throat swelling, SOB or lightheadedness with hypotension:YES Has patient had a PCN reaction causing severe rash involving mucus membranes or skin necrosis: NO Has patient had a PCN reaction that required hospitalization NO Has patient had a PCN reaction occurring within the last 10 years: NO If all of the above answers are "NO", then may proceed with Cephalosporin use.  . Varenicline Tartrate Swelling  . Xarelto [Rivaroxaban]     Bleeding     PHYSICAL EXAM:  ECOG Performance status: 1 - Symptomatic; remains largely independent   Vitals:   07/17/17 1416  BP: 133/60  Pulse:  60  Resp:  18  Temp: 97.6 F (36.4 C)  SpO2: 95%   Filed Weights   07/17/17 1416  Weight: 145 lb 12.8 oz (66.1 kg)    Physical Exam  Constitutional: She is oriented to person, place, and time.  Chronically-ill appearing female in no acute distress  HENT:  Head: Normocephalic.  Mouth/Throat: Oropharynx is clear and moist. No oropharyngeal exudate.  Eyes: Conjunctivae are normal. Pupils are equal, round, and reactive to light. No scleral icterus.  Neck: Normal range of motion. Neck supple.  Cardiovascular: Normal rate and regular rhythm.  Pulmonary/Chest: Effort normal and breath sounds normal. No respiratory distress. She has no wheezes.  Abdominal: Soft. Bowel sounds are normal. There is no tenderness.  Musculoskeletal: Normal range of motion. She exhibits edema (Trace ankle edema).  Lymphadenopathy:    She has no cervical adenopathy.  Neurological: She is alert and oriented to person, place, and time. No cranial nerve deficit.  Skin: Skin is warm and dry. No rash noted.  In area of pt concern to (L) upper chest, there does not appear to be any rash. Dry skin is appreciated throughout chest wall and to her generalized skin.   Psychiatric: Mood, memory, affect and judgment normal.  Nursing note and vitals reviewed.    LABORATORY DATA:  I have reviewed the labs as listed.  CBC    Component Value Date/Time   WBC 6.9 06/19/2017 1259   RBC 3.88 06/19/2017 1259   HGB 11.6 (L) 06/19/2017 1259   HCT 35.7 (L) 06/19/2017 1259   PLT 210 06/19/2017 1259   MCV 92.0 06/19/2017 1259   MCH 29.9 06/19/2017 1259   MCHC 32.5 06/19/2017 1259   RDW 13.8 06/19/2017 1259   LYMPHSABS 2.2 06/19/2017 1259   MONOABS 0.5 06/19/2017 1259   EOSABS 0.1 06/19/2017 1259   BASOSABS 0.0 06/19/2017 1259   CMP Latest Ref Rng & Units 06/19/2017 12/07/2016 08/20/2016  Glucose 65 - 99 mg/dL 114(H) 101(H) 91  BUN 6 - 20 mg/dL 20 18 31(H)  Creatinine 0.44 - 1.00 mg/dL 1.03(H) 0.94 1.08(H)  Sodium 135 - 145 mmol/L 139  138 136  Potassium 3.5 - 5.1 mmol/L 4.3 3.9 3.9  Chloride 101 - 111 mmol/L 102 104 109  CO2 22 - 32 mmol/L 27 28 23   Calcium 8.9 - 10.3 mg/dL 9.9 9.7 8.4(L)  Total Protein 6.5 - 8.1 g/dL 7.6 6.9 -  Total Bilirubin 0.3 - 1.2 mg/dL 0.4 0.6 -  Alkaline Phos 38 - 126 U/L 49 55 -  AST 15 - 41 U/L 22 20 -  ALT 14 - 54 U/L 12(L) 9(L) -    PENDING LABS:    DIAGNOSTIC IMAGING:    PATHOLOGY:     ASSESSMENT & PLAN:   Anemia:  -Mild element of iron deficiency. Remainder of anemia work-up reportedly negative.  Received dose of IV Feraheme in 12/2016. Reportedly tolerated IV iron well without complaints.  Oncology Flowsheet 12/14/2016  ferumoxytol [facility]) IV 510 mg  -Hgb stable at 11.6 g/dL today.  Iron studies are pending. If she requires an additional dose of IV iron, we will contact her to make those arrangements.  -Continue oral iron and oral vitamin B12 supplementation.  -Return to cancer center in 4 months for follow-up with labs.   Decreased appetite/Nausea:  -Likely multifactorial given multiple comorbid conditions. Currently drinking Glucerna BID.  I will ask our dietitian to come see her today while she is here. Please see his documentation for additional information.  -  E-scribed Compazine PRN to her pharmacy today; encouraged her to try taking Compazine prior to her meals to see if this helps improve her nausea and appetite.  -Will refer her back to Dr. Oneida Alar for further evaluation (pt tells me she is due to see Dr. Oneida Alar soon for follow-up anyway; we will help get this arranged for her).   Pruritis:  -No frank rash appreciated on exam today. Skin is very dry however. Encouraged her to buy OTC Aquaphor and apply to dry skin to see if her symptoms improved. If they do not, then encouraged her to return to PCP.      Dispo:  -Return to cancer center in 4 months for follow-up with labs.  -Refer back to Dr. Oneida Alar for additional GI evaluation.    All questions were answered  to patient's stated satisfaction. Encouraged patient to call with any new concerns or questions before her next visit to the cancer center and we can certain see her sooner, if needed.      Orders placed this encounter:  Orders Placed This Encounter  Procedures  . Vitamin B12  . Folate  . Iron and TIBC  . Ferritin  . CBC with Differential/Platelet  . Comprehensive metabolic panel      Mike Craze, NP [place] [phone]

## 2017-07-16 NOTE — Telephone Encounter (Signed)
Late documentation for 07/13/2017: Call placed to patient.   Reports that she is having increased itching all over body. Denies any new exposure to any allergens.   No rash noted.   Advised to use OTC Benadryl. If S/Sx do not improve, OV will be required.

## 2017-07-17 ENCOUNTER — Encounter (HOSPITAL_COMMUNITY): Payer: Self-pay | Admitting: Adult Health

## 2017-07-17 ENCOUNTER — Inpatient Hospital Stay (HOSPITAL_COMMUNITY): Payer: Medicare HMO | Attending: Internal Medicine | Admitting: Adult Health

## 2017-07-17 ENCOUNTER — Encounter (HOSPITAL_COMMUNITY): Payer: Self-pay | Admitting: Dietician

## 2017-07-17 VITALS — BP 133/60 | HR 60 | Temp 97.6°F | Resp 18 | Wt 145.8 lb

## 2017-07-17 DIAGNOSIS — R630 Anorexia: Secondary | ICD-10-CM

## 2017-07-17 DIAGNOSIS — R110 Nausea: Secondary | ICD-10-CM

## 2017-07-17 DIAGNOSIS — D509 Iron deficiency anemia, unspecified: Secondary | ICD-10-CM

## 2017-07-17 DIAGNOSIS — I509 Heart failure, unspecified: Secondary | ICD-10-CM

## 2017-07-17 DIAGNOSIS — R634 Abnormal weight loss: Secondary | ICD-10-CM | POA: Diagnosis not present

## 2017-07-17 DIAGNOSIS — Z720 Tobacco use: Secondary | ICD-10-CM

## 2017-07-17 DIAGNOSIS — Z950 Presence of cardiac pacemaker: Secondary | ICD-10-CM | POA: Diagnosis not present

## 2017-07-17 DIAGNOSIS — D649 Anemia, unspecified: Secondary | ICD-10-CM | POA: Diagnosis not present

## 2017-07-17 MED ORDER — PROCHLORPERAZINE MALEATE 10 MG PO TABS: 10.0000 mg | ORAL_TABLET | Freq: Four times a day (QID) | ORAL | 0 refills | Status: DC | PRN

## 2017-07-17 NOTE — Patient Instructions (Signed)
[place] at [facility]  Discharge Instructions  A prescription for compazine was sent to your pharmacy for  Nausea.  This may cause  Some drowsiness.  Take it 30 minutes before you eat. Do NOT take the compazine with your phenergan.   You have an appointment with Dr. Oneida Alar this Thursday at 2:30.   You can use aquaphor for your dry skin.  This can be purchased at the drug store or [facility].   _______________________________________________________________  Thank you for choosing [facility] at [facility] to provide your oncology and hematology care.  To afford each patient quality time with our providers, please arrive at least 15 minutes before your scheduled appointment.  You need to re-schedule your appointment if you arrive 10 or more minutes late.  We strive to give you quality time with our providers, and arriving late affects you and other patients whose appointments are after yours.  Also, if you no show three or more times for appointments you may be dismissed from the clinic.  Again, thank you for choosing [place] at [place] hope is that these requests will allow you access to exceptional care and in a timely manner. _______________________________________________________________  If you have questions after your visit, please contact our office at [phone] between the hours of 8:30 a.m. and 5:00 p.m. Voicemails left after 4:30 p.m. will not be returned until the following business day. _______________________________________________________________  For prescription refill requests, have your pharmacy contact our office. _______________________________________________________________  Recommendations made by the consultant and any test results will be sent to your referring physician. _______________________________________________________________

## 2017-07-17 NOTE — Progress Notes (Signed)
Patient asked for case of Ensure. She had been given a case of Ensure today prior to full chart review given her report of very poor appetite/ wt loss. Going forward, do not feel the patient is really appropriate for further supplementation, especially given her reluctance to attempt any other diet changes.   Burtis Junes RD, LDN, CNSC Clinical Nutrition Pager: [phone] 07/17/2017 4:30 PM

## 2017-07-17 NOTE — Progress Notes (Signed)
Nutrition Assessment  Reason for Assessment: Consulted by NP due to progressive wt loss and reported poor appetite  ASSESSMENT: 77 y/o female PMHx Normocytic anemia, complete heart block, COPD, HTN, HLD, PVD, CVD, GERD, CKD, IBS. Presents to [facility] routinely for continued f/u observation of normocytic anemia.   Pt had reported to NP that she had used to 215 lbs a few years back. She has had progressive wt loss due to poor appetite/nausea. She drinks 2x Glucerna/Day to supplement diet.   Went through patients dietary recall.  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 1 Glucerna Bevs; Caffeine free Pepsi  She says she does NOT have nausea? Rather she has a poor appetite and no desire to eat. However, later, she says she avoids items if she thinks they will make her nauseated.   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 She used to fully take care of her until she couldn't do it anymore. SHe says she paid her sisters ex-husband to come back and take care of her, but because he is unable to drive, she is their "chaffeur" and has to take the patient to all MD appointments which takes up multiple, entire days each week.  She is overwhelmed and says her sister is fairly inconsiderate doesn't see the pt has her own health problems.   Of note, wt loss is not corroborated by objective chart history. Per review of chart, the patients weight is stable vs up in the past year. She was 145-155 in 2017, 155-165 in 2016, She had been largely stable at 165-175 prior to that point. No where in the past 9 years has the patient been over 190 lbs.   Wt Readings from Last 10 Encounters:  07/17/17 145 lb 12.8 oz (66.1 kg)  06/20/17 142 lb (64.4 kg)  04/06/17 143 lb (64.9 kg)  03/26/17 146 lb (66.2 kg)  02/26/17 141 lb 3.2 oz (64 kg)  01/31/17 142 lb 3.2 oz (64.5 kg)  12/22/16 142 lb (64.4 kg)   12/07/16 142 lb (64.4 kg)  11/06/16 140 lb 12.8 oz (63.9 kg)  09/05/16 139 lb 6.4 oz (63.2 kg)   Nutrition Focused Physical Exam: deferred  Medications: MVI w/ min, b12, methylprednisolone, amitza, iron, calcium+D, Xanax, Compazine/pheneragn, probiotic  Labs: Last a1c 6 months ago was 6.1   Anthropometrics:  Height: 5\' 5"  (165.1 cm) Weight: 145 lbs 13 oz (66.27 kg) BMI: 24.3-healthy wt for ht.   Estimated Energy Needs Kcals: 1500-1700 (23-26 kcal/kg bw) Protein: 65-80g Pro (1-1.2g/kg bw) Fluid: >1.6 L (25 ml/kg bw)  NUTRITION DIAGNOSIS: Inadequate oral intake r/t chronic anorexia AEB gradual wt loss over past decade.   MALNUTRITION DIAGNOSIS: N/A  INTERVENTION:  Patient is essentially going ~15 hrs fasting daily as she doesn't eat until 12-1 pm. RD told her that her body has likely adapted to this poor habit and she will not be hungry unless she tries to start eating something in the morning. She largely was not interested in this. With much encouragement, she said she make have PB cracker or a PBJ sandwich.   RD explained concept of never eating a food by itself. She should add toppings, condiments, dressings, creams, gravies to her meals. Examples given are adding gravy to mashed potatoes and extra butter to her vegetables.These items are very calorie dense and take up little space in the stomach.  Her diabetes is well controlled and honestly, would like be nonexistant if she her main beverage wasn't caffeine free reg soda. RD said she could increase her kcals by ~250 per day by switching to Ensure Plus. She does not need to drink glucerna. She says she likes the taste of glucerna.   RD gave patient coupons and handouts titled "making the most of every bite".   RD told the pt simply, she is eating extremely little and her body has adapted to this. The only way to break herself of this is to start eating more frequently during the day. Again, she does not want to do this.    Overall, patient sounded to have very little motivation in changing her eating habits. Shot down most of recommendations, by just saying "no, I dont want to" or "I Cant do that". She also sounds to have a large amount of added stress from her social situation w/ her sister likely contributing to poor intake/wt loss.   MONITORING, EVALUATION, GOAL: Weight, reported intake, appetite, diet tolerance  NEXT VISIT: As needed Burtis Junes RD, LDN, CNSC Clinical Nutrition Pager: [phone] 07/17/2017 3:58 PM

## 2017-07-19 ENCOUNTER — Ambulatory Visit (INDEPENDENT_AMBULATORY_CARE_PROVIDER_SITE_OTHER): Payer: Medicare HMO | Admitting: Gastroenterology

## 2017-07-19 ENCOUNTER — Encounter: Payer: Self-pay | Admitting: Gastroenterology

## 2017-07-19 DIAGNOSIS — R531 Weakness: Secondary | ICD-10-CM | POA: Diagnosis not present

## 2017-07-19 DIAGNOSIS — L299 Pruritus, unspecified: Secondary | ICD-10-CM

## 2017-07-19 DIAGNOSIS — K581 Irritable bowel syndrome with constipation: Secondary | ICD-10-CM | POA: Diagnosis not present

## 2017-07-19 DIAGNOSIS — K219 Gastro-esophageal reflux disease without esophagitis: Secondary | ICD-10-CM | POA: Diagnosis not present

## 2017-07-19 MED ORDER — HYDROXYZINE HCL 25 MG PO TABS: ORAL_TABLET | ORAL | 0 refills | Status: DC

## 2017-07-19 NOTE — Assessment & Plan Note (Signed)
RESPONDS TO CORTISONE BUT SYMPTOMS NOT IDEALLY CONTROLLED.  USE HYDROXYZINE 1/2-1 TABLET EVERY 6 HOURS IF NEEDED FOR ITCHING.  MED SIDE EFFECTS DISCUSSED. CALL WITH QUESTIONS OR CONCERNS. FOLLOW UP IN 6 MOS.

## 2017-07-19 NOTE — Progress Notes (Signed)
cc'd to pcp 

## 2017-07-19 NOTE — Patient Instructions (Addendum)
DRINK GREEN OR TUMERIC TEA DAILY.  ADD CENTRUM SILVER DAILY.   ADD WATER (8 OZ AT LEAST) WITH BRAGG'S VINEGAR 2 TSP DAILY FOR ENERGY.   USE HYDROXYZINE 1/2-1 TABLET EVERY 6 HOURS IF NEEDED FOR ITCHING. PLEASE CALL WITH QUESTIONS OR CONCERNS.  CONTINUE B12 AND IRON.   FOLLOW UP IN 6 MOS.

## 2017-07-19 NOTE — Progress Notes (Signed)
Subjective:    Patient ID: Charlotte Henry, female    DOB: 09-Mar-1941, 77 y.o.   MRN: [id number]  Alycia Rossetti, MD   HPI BOWELS DOING GOOD. BEEN ITCHING. HAS CORTISONE. DOESN'T HAPPEN EVRY DAY. ITCHES IN A DIFFERENT PLACE. MAY LAST UNTIL PUTS CORTISONE ON IT.USES DOVE SOAP, NOT NEW.  NEW MATTRESS PAD(FABRIC). LAST HFP: NL. ENSURE/GLUCERNA 2 A DAY. < 1PK/DAY. BMS: 2-3 X/WEEK(#2 OR #5). DOESN'T DRINK WATER. FEELS TIRED. STAYS COLD.    PT DENIES FEVER, CHILLS, HEMATOCHEZIA, HEMATEMESIS, nausea, vomiting, melena, diarrhea, CHEST PAIN, SHORTNESS OF BREATH, CHANGE IN BOWEL IN HABITS, constipation, abdominal pain, problems swallowing, OR heartburn or indigestion.  Past Medical History:  Diagnosis Date  . Arterial fibromuscular dysplasia ([place])   . ASCVD (arteriosclerotic cardiovascular disease)    a. cath in 4/04,-80%LAD; 70% RCA, -> DESx2; residual 60% distal RCA; nl EF  b. Nuc 02/2016 aborted due to bradycardia. Will need to be rescheduled   . Carotid artery occlusion   . Cerebrovascular disease   . CHB (complete heart block) ([facility])    a. s/p PPM on 03/24/16 with a St. Jude (serial number [id number]) pacemaker  . Chronic kidney disease (CKD), stage III (moderate) (H[facility])   . Clostridium difficile colitis 03/2013   a. 03/2013  . COPD (chronic obstructive pulmonary disease) ([place])   . Coronary disease   . Diabetes mellitus   . Diverticulosis   . GERD (gastroesophageal reflux disease)   . Gout   . H pylori ulcer   . Hyperlipidemia    chose to stop statin, takes Zetia  . Hypertension    not currently on medication  . IBS (irritable bowel syndrome)   . PVD (peripheral vascular disease) ([place])    a. moderate to severely decreased ABI's in 8/08, sig. aortic inflow disease b. repeat ABI's in 2011 improved- mild disease on the left and moderate to severe disease on the right  . S/P placement of cardiac pacemaker    a. 03/24/16: St. Jude (serial number [id number]) pacemaker  . Symptomatic anemia  08/15/2016  . TIA (transient ischemic attack) 2012  . Tobacco abuse    a. 50 pack years continuing at 1/2 pack per day  . Tobacco use disorder 10/15/2015    Past Surgical History:  Procedure Laterality Date  . ABDOMINAL HYSTERECTOMY    . carpel tunnel release     bilateral  . CHOLECYSTECTOMY    . COLONOSCOPY  2009  . COLONOSCOPY N/A 08/04/2016   incomplete due to prep.   . COLONOSCOPY N/A 08/05/2016   Dr. Oneida Alar: moderately redundant rectosigmoid and sigmoid, normal TI, single large-mouthed diverticulum in hepatic flexure, external hemorrhoids, path negative for microscopic colitis   . ENTEROSCOPY N/A 08/18/2016   Procedure: ENTEROSCOPY;  Surgeon: Daneil Dolin, MD;  [facility];  Service: Endoscopy;  Laterality: N/A;  Pediatric colonoscope  . EP IMPLANTABLE DEVICE N/A 03/24/2016   Procedure: Pacemaker Implant;  Surgeon: [name], MD;  Location: [facility];  Service: Cardiovascular;  Laterality: N/A;  . ESOPHAGOGASTRODUODENOSCOPY N/A 08/04/2016   Dr. Oneida Alar: non-critical Schatzki's ring, small hiatal hernia, +H.pylori gastritis on path.   Marland Kitchen GIVENS CAPSULE STUDY N/A 08/16/2016   Procedure: GIVENS CAPSULE STUDY;  Surgeon: Daneil Dolin, MD;  Location: [facility];  Service: Endoscopy;  Laterality: N/A;  . TOTAL ABDOMINAL HYSTERECTOMY W/ BILATERAL SALPINGOOPHORECTOMY      Allergies  Allergen Reactions  . Ace Inhibitors Hives, Shortness Of Breath and Swelling  .  Aspirin Other (See Comments)    Blood in stool and nose bleed  . Codeine Swelling  . Pineapple Shortness Of Breath and Swelling  . Plasticized Base [Plastibase] Hives and Swelling    No plastics  . Ultram [Tramadol] Swelling  . Adhesive [Tape] Swelling and Rash  . Naprosyn [Naproxen] Swelling  . Biaxin [Clarithromycin] Nausea And Vomiting and Other (See Comments)    Dizziness.  . Clindamycin/Lincomycin Other (See Comments)    Burning sensation throat, abdomen  . Linzess [Linaclotide]     DIARRHEA,  FECAL INCONTINENCE  . Metronidazole     NAUSEA AND WEAKNESS  . Nsaids   . Penicillins       . Varenicline Tartrate Swelling  . Xarelto [Rivaroxaban]     Bleeding    Current Outpatient Medications  Medication Sig Dispense Refill  . acetaminophen (TYLENOL) 500 MG tablet Take 500 mg by mouth every 6 (six) hours as needed for mild pain or moderate pain.    Marland Kitchen albuterol (PROVENTIL HFA;VENTOLIN HFA) 108 (90 Base) MCG/ACT inhaler Inhale 2 puffs into the lungs every 4 (four) hours as needed for wheezing or shortness of breath.    . ALPRAZolam (XANAX) 0.25 MG tablet Take 1 tablet (0.25 mg total) by mouth at bedtime as needed.    . calcium citrate-vitamin D (CITRACAL+D) 315-200 MG-UNIT per tablet Take 1 tablet by mouth daily.      . Cranberry 250 MG CAPS Take 250 mg by mouth daily.    . Cyanocobalamin (VITAMIN B 12 PO) Take 1 tablet by mouth daily.     Marland Kitchen esomeprazole (NEXIUM) 40 MG capsule TAKE 1 CAPSULE BY MOUTH DAILY BEFORE BREAKFAST.    Marland Kitchen ezetimibe (ZETIA) 10 MG tablet Take 1 tablet (10 mg total) by mouth daily.    . ferrous sulfate 325 (65 FE) MG tablet Take 325 mg by mouth daily with breakfast.    . Fluticasone-Salmeterol (ADVAIR) 100-50 MCG/DOSE AEPB Inhale 1 puff into the lungs 2 (two) times daily. (Patient taking differently: Inhale 1 puff into the lungs as needed. )    . gabapentin (NEURONTIN) 100 MG capsule TAKE 1 TO 2 CAPSULES BY MOUTH AT BEDTIME FOR NERVE PAIN.    Marland Kitchen lubiprostone (AMITIZA) 8 MCG capsule Take 8 mcg by mouth as needed.     . methylPREDNISolone (MEDROL DOSEPAK) 4 MG TBPK tablet Take as directed on package    . mirabegron ER (MYRBETRIQ) 25 MG TB24 tablet Take 1 tablet (25 mg total) by mouth at bedtime. For bladder (Patient taking differently: Take 25 mg by mouth as needed. For bladder)    .      Marland Kitchen Probiotic Product (PROBIOTIC DAILY PO) Take 1 capsule by mouth daily.     . prochlorperazine (COMPAZINE) 10 MG tablet Take 1 tablet (10 mg total) by mouth every 6 (six) hours as  needed for nausea or vomiting.    . promethazine (PHENERGAN) 12.5 MG tablet 1-2 po 30 MINS PRIOR TO MEDS OR MEALS. MAY REPEAT EVERY 4-6 h prn nausea or vomiting    . Tiotropium Bromide Monohydrate (SPIRIVA RESPIMAT) 1.25 MCG/ACT AERS Inhale 2 puffs into the lungs daily. (Patient taking differently: Inhale 2 puffs into the lungs as needed. )     Review of Systems PER HPI OTHERWISE ALL SYSTEMS ARE NEGATIVE.    Objective:   Physical Exam  Constitutional: She is oriented to person, place, and time. She appears well-developed and well-nourished. No distress.  HENT:  Head: Normocephalic and atraumatic.  Mouth/Throat: Oropharynx is  clear and moist. No oropharyngeal exudate.  Eyes: Pupils are equal, round, and reactive to light. No scleral icterus.  Neck: Normal range of motion. Neck supple.  Cardiovascular: Normal rate, regular rhythm and normal heart sounds.  Pulmonary/Chest: Effort normal and breath sounds normal. No respiratory distress.  Abdominal: Soft. Bowel sounds are normal. She exhibits no distension. There is no tenderness.  Musculoskeletal: She exhibits no edema.  Walks with wide based gait, ABLE TO GET ON EXAM TABLE.  Lymphadenopathy:    She has no cervical adenopathy.  Neurological: She is alert and oriented to person, place, and time.  NO  NEW FOCAL DEFICITS  Psychiatric: She has a normal mood and affect. Judgment and thought content normal.  Vitals reviewed.     Assessment & Plan:

## 2017-07-19 NOTE — Assessment & Plan Note (Signed)
SYMPTOMS CONTROLLED/RESOLVED.  CONTINUE TO MONITOR SYMPTOMS. PROBIOTIC DAILY FOLLOW UP IN 6 MOS.

## 2017-07-19 NOTE — Assessment & Plan Note (Signed)
SYMPTOMS CONTROLLED/RESOLVED.  CONTINUE TO MONITOR SYMPTOMS. AVOID TRIGGERS. FOLLOW UP IN 6 MOS.

## 2017-07-19 NOTE — Assessment & Plan Note (Signed)
SYMPTOMS NOT IDEALLY CONTROLLED. ETIOLOGY UNCLEAR.  DRINK GREEN OR TUMERIC TEA DAILY. ADD CENTRUM SILVER DAILY.  ADD WATER (8 OZ AT LEAST) WITH BRAGG'S VINEGAR 2 TSP DAILY FOR ENERGY. USE HYDROXYZINE 1/2-1 TABLET EVERY 6 HOURS IF NEEDED FOR ITCHING.  CONTINUE B12 AND IRON.  FOLLOW UP IN 6 MOS.

## 2017-07-22 ENCOUNTER — Encounter (HOSPITAL_COMMUNITY): Payer: Self-pay | Admitting: Adult Health

## 2017-07-24 LAB — CUP PACEART REMOTE DEVICE CHECK
Battery Remaining Percentage: 95.5 %
Brady Statistic AP VP Percent: 45 %
Brady Statistic AS VP Percent: 55 %
Brady Statistic RA Percent Paced: 45 %
Implantable Lead Implant Date: 20171020
Implantable Lead Location: 753859
Implantable Lead Location: 753860
Lead Channel Impedance Value: 450 Ohm
Lead Channel Pacing Threshold Pulse Width: 0.4 ms
Lead Channel Sensing Intrinsic Amplitude: 9.6 mV
Lead Channel Setting Pacing Amplitude: 2 V
Lead Channel Setting Pacing Pulse Width: 0.4 ms
MDC IDC LEAD IMPLANT DT: 20171020
MDC IDC MSMT BATTERY REMAINING LONGEVITY: 109 mo
MDC IDC MSMT BATTERY VOLTAGE: 2.99 V
MDC IDC MSMT LEADCHNL RA IMPEDANCE VALUE: 410 Ohm
MDC IDC MSMT LEADCHNL RA PACING THRESHOLD AMPLITUDE: 0.5 V
MDC IDC MSMT LEADCHNL RA SENSING INTR AMPL: 2.1 mV
MDC IDC MSMT LEADCHNL RV PACING THRESHOLD AMPLITUDE: 0.75 V
MDC IDC MSMT LEADCHNL RV PACING THRESHOLD PULSEWIDTH: 0.4 ms
MDC IDC PG IMPLANT DT: 20171020
MDC IDC SESS DTM: 20190204105400
MDC IDC SET LEADCHNL RV PACING AMPLITUDE: 2.5 V
MDC IDC SET LEADCHNL RV SENSING SENSITIVITY: 3.5 mV
MDC IDC STAT BRADY AP VS PERCENT: 1 %
MDC IDC STAT BRADY AS VS PERCENT: 1 %
MDC IDC STAT BRADY RV PERCENT PACED: 99 %
Pulse Gen Serial Number: 3180497

## 2017-08-20 ENCOUNTER — Telehealth: Payer: Self-pay | Admitting: Family Medicine

## 2017-09-05 ENCOUNTER — Encounter (HOSPITAL_COMMUNITY): Payer: Medicare HMO

## 2017-09-05 ENCOUNTER — Ambulatory Visit: Payer: Medicare HMO | Admitting: Vascular Surgery

## 2017-09-20 ENCOUNTER — Ambulatory Visit: Payer: Medicare HMO | Admitting: Nurse Practitioner

## 2017-10-08 ENCOUNTER — Ambulatory Visit (INDEPENDENT_AMBULATORY_CARE_PROVIDER_SITE_OTHER): Payer: Medicare HMO | Admitting: *Deleted

## 2017-10-08 DIAGNOSIS — I442 Atrioventricular block, complete: Secondary | ICD-10-CM

## 2017-10-08 NOTE — Progress Notes (Signed)
Remote pacemaker transmission.   

## 2017-10-10 ENCOUNTER — Encounter: Payer: Self-pay | Admitting: Cardiology

## 2017-10-22 ENCOUNTER — Other Ambulatory Visit: Payer: Self-pay

## 2017-10-22 ENCOUNTER — Encounter: Payer: Self-pay | Admitting: Physician Assistant

## 2017-10-22 ENCOUNTER — Ambulatory Visit (INDEPENDENT_AMBULATORY_CARE_PROVIDER_SITE_OTHER): Payer: Medicare HMO | Admitting: Physician Assistant

## 2017-10-22 VITALS — BP 122/74 | Temp 98.4°F | Ht 65.0 in | Wt 146.0 lb

## 2017-10-22 DIAGNOSIS — R531 Weakness: Secondary | ICD-10-CM

## 2017-10-22 DIAGNOSIS — E46 Unspecified protein-calorie malnutrition: Secondary | ICD-10-CM | POA: Diagnosis not present

## 2017-10-22 DIAGNOSIS — D649 Anemia, unspecified: Secondary | ICD-10-CM | POA: Diagnosis not present

## 2017-10-22 DIAGNOSIS — R739 Hyperglycemia, unspecified: Secondary | ICD-10-CM | POA: Diagnosis not present

## 2017-10-22 DIAGNOSIS — R5383 Other fatigue: Secondary | ICD-10-CM

## 2017-10-22 LAB — CUP PACEART REMOTE DEVICE CHECK
Battery Remaining Longevity: 108 mo
Battery Remaining Percentage: 95.5 %
Brady Statistic RA Percent Paced: 44 %
Brady Statistic RV Percent Paced: 99 %
Date Time Interrogation Session: 20190506060016
Implantable Lead Implant Date: 20171020
Implantable Lead Location: 753859
Implantable Lead Location: 753860
Implantable Pulse Generator Implant Date: 20171020
Lead Channel Impedance Value: 410 Ohm
Lead Channel Pacing Threshold Amplitude: 0.5 V
Lead Channel Pacing Threshold Amplitude: 0.75 V
Lead Channel Pacing Threshold Pulse Width: 0.4 ms
Lead Channel Sensing Intrinsic Amplitude: 2.1 mV
Lead Channel Setting Pacing Amplitude: 2 V
Lead Channel Setting Pacing Pulse Width: 0.4 ms
Lead Channel Setting Sensing Sensitivity: 3.5 mV
MDC IDC LEAD IMPLANT DT: 20171020
MDC IDC MSMT BATTERY VOLTAGE: 2.99 V
MDC IDC MSMT LEADCHNL RV IMPEDANCE VALUE: 430 Ohm
MDC IDC MSMT LEADCHNL RV PACING THRESHOLD PULSEWIDTH: 0.4 ms
MDC IDC MSMT LEADCHNL RV SENSING INTR AMPL: 10.6 mV
MDC IDC SET LEADCHNL RV PACING AMPLITUDE: 2.5 V
MDC IDC STAT BRADY AP VP PERCENT: 45 %
MDC IDC STAT BRADY AP VS PERCENT: 1 %
MDC IDC STAT BRADY AS VP PERCENT: 55 %
MDC IDC STAT BRADY AS VS PERCENT: 1 %
Pulse Gen Model: 2272
Pulse Gen Serial Number: 3180497

## 2017-10-22 NOTE — Progress Notes (Signed)
Patient ID: Charlotte Henry MRN: [id number], DOB: 28-Jul-1940, 77 y.o. Date of Encounter: @DATE @  Chief Complaint:  Chief Complaint  Patient presents with  . Fatigue    Onset of fatigue friday. states has had to get blood transfusion and iron infusions in the past.    HPI: 77 y.o. year old female  presents with above.   Daughter accompanies her for visit today. They report that patient has been feeling more weak than usual.  Reports that she has history of anemia and sees hematology routinely for that but wanted to come in and get those labs checked to make sure that those labs are stable and that anemia has not worsened and "is not causing increased weakness.  They report that she is weaker and moving slower than her usual baseline. They have no other specific concerns to address.  Our staff did give her some samples of Ensure to use today.  She denies any syncope or presyncope.  No palpitations.  No chest pain or shortness of breath.   Past Medical History:  Diagnosis Date  . Arterial fibromuscular dysplasia ([place])   . ASCVD (arteriosclerotic cardiovascular disease)    a. cath in 4/04,-80%LAD; 70% RCA, -> DESx2; residual 60% distal RCA; nl EF  b. Nuc 02/2016 aborted due to bradycardia. Will need to be rescheduled   . Carotid artery occlusion   . Cerebrovascular disease   . CHB (complete heart block) ([place])    a. s/p PPM on 03/24/16 with a St. Jude (serial number [id number]) pacemaker  . Chronic kidney disease (CKD), stage III (moderate) (H[facility])   . Clostridium difficile colitis 03/2013   a. 03/2013  . COPD (chronic obstructive pulmonary disease) ([place])   . Coronary disease   . Diabetes mellitus   . Diverticulosis   . GERD (gastroesophageal reflux disease)   . Gout   . H pylori ulcer   . Hyperlipidemia    chose to stop statin, takes Zetia  . Hypertension    not currently on medication  . IBS (irritable bowel syndrome)   . PVD (peripheral vascular disease) ([place])    a.  moderate to severely decreased ABI's in 8/08, sig. aortic inflow disease b. repeat ABI's in 2011 improved- mild disease on the left and moderate to severe disease on the right  . S/P placement of cardiac pacemaker    a. 03/24/16: St. Jude (serial number [id number]) pacemaker  . Symptomatic anemia 08/15/2016  . TIA (transient ischemic attack) 2012  . Tobacco abuse    a. 50 pack years continuing at 1/2 pack per day  . Tobacco use disorder 10/15/2015     Home Meds: Outpatient Medications Prior to Visit  Medication Sig Dispense Refill  . acetaminophen (TYLENOL) 500 MG tablet Take 500 mg by mouth every 6 (six) hours as needed for mild pain or moderate pain.    Marland Kitchen albuterol (PROVENTIL HFA;VENTOLIN HFA) 108 (90 Base) MCG/ACT inhaler Inhale 2 puffs into the lungs every 4 (four) hours as needed for wheezing or shortness of breath. 1 Inhaler 2  . ALPRAZolam (XANAX) 0.25 MG tablet Take 1 tablet (0.25 mg total) by mouth at bedtime as needed. 30 tablet 1  . calcium citrate-vitamin D (CITRACAL+D) 315-200 MG-UNIT per tablet Take 1 tablet by mouth daily.      . Cranberry 250 MG CAPS Take 250 mg by mouth daily.    . Cyanocobalamin (VITAMIN B 12 PO) Take 1 tablet by mouth daily.     Marland Kitchen esomeprazole ([place])  40 MG capsule TAKE 1 CAPSULE BY MOUTH DAILY BEFORE BREAKFAST. 30 capsule 11  . ezetimibe (ZETIA) 10 MG tablet Take 1 tablet (10 mg total) by mouth daily. 90 tablet 3  . ferrous sulfate 325 (65 FE) MG tablet Take 325 mg by mouth daily with breakfast.    . Fluticasone-Salmeterol (ADVAIR) 100-50 MCG/DOSE AEPB Inhale 1 puff into the lungs 2 (two) times daily. (Patient taking differently: Inhale 1 puff into the lungs as needed. ) 1 each 3  . gabapentin (NEURONTIN) 100 MG capsule TAKE 1 TO 2 CAPSULES BY MOUTH AT BEDTIME FOR NERVE PAIN. 60 capsule 6  . hydrOXYzine (ATARAX/VISTARIL) 25 MG tablet 1 PO Q6H PRN FOR ITCHING. MAY REPEAT A DOSE 1 HOUR AFTER EACH DOSE. NO MORE THAN 8 PILLS/DAY. 45 tablet 0  . lubiprostone  (AMITIZA) 8 MCG capsule Take 8 mcg by mouth as needed.     . methylPREDNISolone (MEDROL DOSEPAK) 4 MG TBPK tablet Take as directed on package 21 tablet 0  . mirabegron ER (MYRBETRIQ) 25 MG TB24 tablet Take 1 tablet (25 mg total) by mouth at bedtime. For bladder (Patient taking differently: Take 25 mg by mouth as needed. For bladder) 30 tablet 6  . Multiple Vitamin (MULTIVITAMIN WITH MINERALS) TABS tablet Take 1 tablet by mouth daily.    . Probiotic Product (PROBIOTIC DAILY PO) Take 1 capsule by mouth daily.     . prochlorperazine (COMPAZINE) 10 MG tablet Take 1 tablet (10 mg total) by mouth every 6 (six) hours as needed for nausea or vomiting. 30 tablet 0  . promethazine (PHENERGAN) 12.5 MG tablet 1-2 po 30 MINS PRIOR TO MEDS OR MEALS. MAY REPEAT EVERY 4-6 h prn nausea or vomiting 40 tablet 1  . Tiotropium Bromide Monohydrate (SPIRIVA RESPIMAT) 1.25 MCG/ACT AERS Inhale 2 puffs into the lungs daily. (Patient taking differently: Inhale 2 puffs into the lungs as needed. ) 1 Inhaler 11   No facility-administered medications prior to visit.     Allergies:  Allergies  Allergen Reactions  . Ace Inhibitors Hives, Shortness Of Breath and Swelling  . Aspirin Other (See Comments)    Blood in stool and nose bleed  . Codeine Swelling  . Pineapple Shortness Of Breath and Swelling  . Plasticized Base [Plastibase] Hives and Swelling    No plastics  . Ultram [Tramadol] Swelling  . Adhesive [Tape] Swelling and Rash  . Naprosyn [Naproxen] Swelling  . Biaxin [Clarithromycin] Nausea And Vomiting and Other (See Comments)    Dizziness.  . Clindamycin/Lincomycin Other (See Comments)    Burning sensation throat, abdomen  . Linzess [Linaclotide]     DIARRHEA, FECAL INCONTINENCE  . Metronidazole     NAUSEA AND WEAKNESS  . Nsaids   . Penicillins     Lip swelling, Hives Has patient had a PCN reaction causing immediate rash, facial/tongue/throat swelling, SOB or lightheadedness with hypotension:YES Has patient  had a PCN reaction causing severe rash involving mucus membranes or skin necrosis: NO Has patient had a PCN reaction that required hospitalization NO Has patient had a PCN reaction occurring within the last 10 years: NO If all of the above answers are "NO", then may proceed with Cephalosporin use.  . Varenicline Tartrate Swelling  . Xarelto [Rivaroxaban]     Bleeding    Social History   Socioeconomic History  . Marital status: Widowed    Spouse name: Not on file  . Number of children: Not on file  . Years of education: Not on file  .  Highest education level: Not on file  Occupational History  . Not on file  Social Needs  . Financial resource strain: Not on file  . Food insecurity:    Worry: Not on file    Inability: Not on file  . Transportation needs:    Medical: Not on file    Non-medical: Not on file  Tobacco Use  . Smoking status: Current Every Day Smoker    Packs/day: 1.00    Years: 50.00    Pack years: 50.00    Types: Cigarettes    Start date: 02/02/1966  . Smokeless tobacco: Never Used  . Tobacco comment: 1/2 pack daily  Substance and Sexual Activity  . Alcohol use: No    Alcohol/week: 0.0 oz    Comment: H/O of 6 pack per day quiting in 2003  . Drug use: No  . Sexual activity: Not Currently  Lifestyle  . Physical activity:    Days per week: Not on file    Minutes per session: Not on file  . Stress: Not on file  Relationships  . Social connections:    Talks on phone: Not on file    Gets together: Not on file    Attends religious service: Not on file    Active member of club or organization: Not on file    Attends meetings of clubs or organizations: Not on file    Relationship status: Not on file  . Intimate partner violence:    Fear of current or ex partner: Not on file    Emotionally abused: Not on file    Physically abused: Not on file    Forced sexual activity: Not on file  Other Topics Concern  . Not on file  Social History Narrative  . Not on  file    Family History  Problem Relation Age of Onset  . Liver disease Mother 38  . Hypertension Mother   . Cerebral aneurysm Father   . Aneurysm Father 17  . Cancer Sister   . Liver disease Brother   . Liver disease Brother   . Liver disease Brother   . Diabetes type II Brother   . Alcohol abuse Brother   . Cancer Sister   . Colon cancer Neg Hx      Review of Systems:  See HPI for pertinent ROS. All other ROS negative.    Physical Exam: Blood pressure 122/74, temperature 98.4 F (36.9 C), temperature source Oral, height 5\' 5"  (1.651 m), weight 66.2 kg (146 lb)., Body mass index is 24.3 kg/m. General:  WNWD AAF. Appears in no acute distress. Neck: Supple. No thyromegaly. No lymphadenopathy. Lungs: Clear bilaterally to auscultation without wheezes, rales, or rhonchi. Breathing is unlabored. Heart: RRR with S1 S2. No murmurs, rubs, or gallops. Abdomen: Soft, non-tender, non-distended with normoactive bowel sounds. No hepatomegaly. No rebound/guarding. No obvious abdominal masses. Musculoskeletal:  Strength and tone normal for age. Extremities/Skin: Warm and dry. No LE edema. Neuro: Alert and oriented X 3. Moves all extremities spontaneously. Gait is normal. CNII-XII grossly in tact. Psych:  Responds to questions appropriately with a normal affect.     ASSESSMENT AND PLAN:  77 y.o. year old female with  1. Weakness Check labs to evaluate for underlying cause of increased weakness.  Will evaluate her history of anemia.  Also evaluate electrolytes and protein given history of protein deficiency.  Also will check for hypothyroidism as cause of increased weakness. - COMPLETE METABOLIC PANEL WITH GFR - Hemoglobin A1c - TSH -  Iron, TIBC and Ferritin Panel - CBC with Differential/Platelet - Vitamin B12 - Folate  2. Fatigue, unspecified type Check labs to evaluate for underlying cause of increased fatigue.  Will evaluate her history of anemia.  Also evaluate electrolytes and  protein given history of protein deficiency.  Also will check for hypothyroidism as cause of increased weakness. - COMPLETE METABOLIC PANEL WITH GFR - Hemoglobin A1c - TSH - Iron, TIBC and Ferritin Panel - CBC with Differential/Platelet - Vitamin B12 - Folate  3. Protein-calorie malnutrition, unspecified severity ([place]) Office staff gave her box of samples of Ensure today. - COMPLETE METABOLIC PANEL WITH GFR  4. Symptomatic anemia She has history of symptomatic anemia and has been managed by hematology.  Will recheck those labs to monitor now when her symptoms of increased weakness and fatigue.. - TSH - Iron, TIBC and Ferritin Panel - CBC with Differential/Platelet - Vitamin B12 - Folate  5. Generalized weakness She has history of symptomatic anemia, generalized weakness and has been managed by hematology.  Will recheck those labs to monitor now when her symptoms of increased weakness and fatigue.. - COMPLETE METABOLIC PANEL WITH GFR - Hemoglobin A1c - TSH - Iron, TIBC and Ferritin Panel - CBC with Differential/Platelet - Vitamin B12 - Folate  6. Hyperglycemia Last A1c was 12/22/2016 and was 6.1 at that time.  Will recheck A1c to monitor. - COMPLETE METABOLIC PANEL WITH GFR - Hemoglobin A1c   Signed, [address] [place], [place], [facility] 10/22/2017 12:51 PM

## 2017-10-22 NOTE — Addendum Note (Signed)
Addended by: Dena Billet B on: 10/22/2017 01:00 PM   Modules accepted: Orders

## 2017-10-23 ENCOUNTER — Other Ambulatory Visit: Payer: Self-pay | Admitting: *Deleted

## 2017-10-23 ENCOUNTER — Telehealth: Payer: Self-pay | Admitting: Family Medicine

## 2017-10-23 DIAGNOSIS — M170 Bilateral primary osteoarthritis of knee: Secondary | ICD-10-CM

## 2017-10-23 DIAGNOSIS — R29898 Other symptoms and signs involving the musculoskeletal system: Secondary | ICD-10-CM

## 2017-10-23 DIAGNOSIS — R531 Weakness: Secondary | ICD-10-CM

## 2017-10-23 DIAGNOSIS — E46 Unspecified protein-calorie malnutrition: Secondary | ICD-10-CM

## 2017-10-23 DIAGNOSIS — R2681 Unsteadiness on feet: Secondary | ICD-10-CM

## 2017-10-23 DIAGNOSIS — R5383 Other fatigue: Secondary | ICD-10-CM

## 2017-10-23 LAB — CBC WITH DIFFERENTIAL/PLATELET
BASOS PCT: 0.7 %
Basophils Absolute: 43 cells/uL (ref 0–200)
Eosinophils Absolute: 61 cells/uL (ref 15–500)
Eosinophils Relative: 1 %
HCT: 34.1 % — ABNORMAL LOW (ref 35.0–45.0)
HEMOGLOBIN: 11.5 g/dL — AB (ref 11.7–15.5)
LYMPHS ABS: 1983 {cells}/uL (ref 850–3900)
MCH: 30.4 pg (ref 27.0–33.0)
MCHC: 33.7 g/dL (ref 32.0–36.0)
MCV: 90.2 fL (ref 80.0–100.0)
MPV: 10.2 fL (ref 7.5–12.5)
Monocytes Relative: 8.5 %
NEUTROS ABS: 3495 {cells}/uL (ref 1500–7800)
Neutrophils Relative %: 57.3 %
Platelets: 211 10*3/uL (ref 140–400)
RBC: 3.78 10*6/uL — ABNORMAL LOW (ref 3.80–5.10)
RDW: 12.9 % (ref 11.0–15.0)
Total Lymphocyte: 32.5 %
WBC: 6.1 10*3/uL (ref 3.8–10.8)
WBCMIX: 519 {cells}/uL (ref 200–950)

## 2017-10-23 LAB — COMPLETE METABOLIC PANEL WITH GFR
AG RATIO: 1.4 (calc) (ref 1.0–2.5)
ALBUMIN MSPROF: 4 g/dL (ref 3.6–5.1)
ALKALINE PHOSPHATASE (APISO): 51 U/L (ref 33–130)
ALT: 9 U/L (ref 6–29)
AST: 20 U/L (ref 10–35)
BILIRUBIN TOTAL: 0.4 mg/dL (ref 0.2–1.2)
BUN / CREAT RATIO: 15 (calc) (ref 6–22)
BUN: 16 mg/dL (ref 7–25)
CHLORIDE: 110 mmol/L (ref 98–110)
CO2: 26 mmol/L (ref 20–32)
Calcium: 9.9 mg/dL (ref 8.6–10.4)
Creat: 1.08 mg/dL — ABNORMAL HIGH (ref 0.60–0.93)
GFR, Est African American: 57 mL/min/{1.73_m2} — ABNORMAL LOW (ref >=60)
GFR, Est Non African American: 49 mL/min/{1.73_m2} — ABNORMAL LOW (ref >=60)
GLUCOSE: 86 mg/dL (ref 65–99)
Globulin: 2.8 g/dL (calc) (ref 1.9–3.7)
POTASSIUM: 5 mmol/L (ref 3.5–5.3)
Sodium: 144 mmol/L (ref 135–146)
Total Protein: 6.8 g/dL (ref 6.1–8.1)

## 2017-10-23 LAB — VITAMIN B12: Vitamin B-12: >2000 pg/mL — ABNORMAL HIGH (ref 200–1100)

## 2017-10-23 LAB — HEMOGLOBIN A1C
EAG (MMOL/L): 7 (calc)
Hgb A1c MFr Bld: 6 % of total Hgb — ABNORMAL HIGH (ref <5.7)
MEAN PLASMA GLUCOSE: 126 (calc)

## 2017-10-23 LAB — IRON,TIBC AND FERRITIN PANEL
%SAT: 36 % (calc) (ref 11–50)
Ferritin: 394 ng/mL — ABNORMAL HIGH (ref 20–288)
Iron: 77 ug/dL (ref 45–160)
TIBC: 214 mcg/dL (calc) — ABNORMAL LOW (ref 250–450)

## 2017-10-23 LAB — TSH: TSH: 1.82 mIU/L (ref 0.40–4.50)

## 2017-10-23 LAB — FOLATE: FOLATE: 20.8 ng/mL

## 2017-10-23 NOTE — Telephone Encounter (Signed)
Call placed to patient to inquire.   Requested information on lab results. Discussed results. HH PT orders placed.

## 2017-10-23 NOTE — Telephone Encounter (Signed)
Patient called at lunch to see if she could speak to you  But no reason why [phone] (H)

## 2017-10-24 ENCOUNTER — Ambulatory Visit: Payer: Medicare HMO | Admitting: Vascular Surgery

## 2017-10-24 ENCOUNTER — Encounter (HOSPITAL_COMMUNITY): Payer: Medicare HMO

## 2017-10-24 ENCOUNTER — Telehealth: Payer: Self-pay | Admitting: *Deleted

## 2017-10-24 NOTE — Telephone Encounter (Signed)
Received call from patient daughter, Anderson [place]. [phone] telephone.   Reports that she is concerned that patient is not appropriate to stay home alone any longer. Reports that though labs showed no cause for weakness, patient has been progressively worsening. States that patient is also exhibiting some depression Sx (anhedonia, fatigue, irritability). Requested appointment with PCP only for complete work up and recommendations.   Appointment scheduled for Friday, 10/26/2017. Advised that if Sx worsen or patient daughter is concerned, she can either bring patient in for F/U with another provider or go to ER for evaluation.   MD to be made aware.

## 2017-10-24 NOTE — Telephone Encounter (Signed)
noted 

## 2017-10-26 ENCOUNTER — Ambulatory Visit (INDEPENDENT_AMBULATORY_CARE_PROVIDER_SITE_OTHER): Payer: Medicare HMO | Admitting: Family Medicine

## 2017-10-26 ENCOUNTER — Other Ambulatory Visit: Payer: Self-pay

## 2017-10-26 ENCOUNTER — Encounter: Payer: Self-pay | Admitting: Family Medicine

## 2017-10-26 VITALS — BP 122/68 | HR 60 | Temp 97.9°F | Resp 14 | Ht 65.0 in | Wt 145.0 lb

## 2017-10-26 DIAGNOSIS — R531 Weakness: Secondary | ICD-10-CM | POA: Diagnosis not present

## 2017-10-26 DIAGNOSIS — I10 Essential (primary) hypertension: Secondary | ICD-10-CM

## 2017-10-26 DIAGNOSIS — I739 Peripheral vascular disease, unspecified: Secondary | ICD-10-CM | POA: Diagnosis not present

## 2017-10-26 DIAGNOSIS — E1159 Type 2 diabetes mellitus with other circulatory complications: Secondary | ICD-10-CM

## 2017-10-26 DIAGNOSIS — E46 Unspecified protein-calorie malnutrition: Secondary | ICD-10-CM | POA: Diagnosis not present

## 2017-10-26 DIAGNOSIS — R2681 Unsteadiness on feet: Secondary | ICD-10-CM

## 2017-10-26 MED ORDER — MIRTAZAPINE 15 MG PO TABS: 7.5000 mg | ORAL_TABLET | Freq: Every day | ORAL | 2 refills | Status: DC

## 2017-10-26 NOTE — Progress Notes (Signed)
   Subjective:    Patient ID: Charlotte Henry, female    DOB: 06/27/1940, 77 y.o.   MRN: [id number]  Patient presents for Follow-up (weakness)  Pt here with daughter seen earlier this week due to weakness. Poor appetite, often only drinks glucerna or ensure. Requires more help getting out of chairs, using cane. Seems depressed per her daughter, does not want to leave the house or go out with friends. She did make a decision to stop driving on her own. Denies any pain anywhere. Bowels moving fair. Normal urination No recent fever Her labs done were at baseline  No weight loss    Review Of Systems:  GEN- + fatigue, fever, weight loss,+weakness, recent illness HEENT- denies eye drainage, change in vision, nasal discharge, CVS- denies chest pain, palpitations RESP- denies SOB, cough, wheeze ABD- denies N/V, change in stools, abd pain GU- denies dysuria, hematuria, dribbling, incontinence MSK- denies joint pain, muscle aches, injury Neuro- denies headache, dizziness, syncope, seizure activity       Objective:    BP 122/68   Pulse 60   Temp 97.9 F (36.6 C) (Oral)   Resp 14   Ht 5\' 5"  (1.651 m)   Wt 145 lb (65.8 kg)   SpO2 96%   BMI 24.13 kg/m  GEN- NAD, alert and oriented x3 HEENT- PERRL, EOMI, non injected sclera, pink conjunctiva, MMM, oropharynx clear Neck- Supple, no thyromegaly CVS- RRR, 2/6 systolic murmur RESP-CTAB ABD-NABS,soft,NT,ND MSK- decreased ROM upper and lower ext, walks with cane  EXT-trace ankle edema Pulses- Radial 2+        Assessment & Plan:      Problem List Items Addressed This Visit      Unprioritized   Gait instability   Protein-calorie malnutrition ([place]) - Primary    Poor diet, protein malutrition as drinking mostly ensure, despite maintaining her weight She states her appetite is just not good Start remeron 7.5mg  at bedtime to help with appetite, and mood      Peripheral vascular disease ([place])    With mild edema Use compression  hose which she has at home Hold on diuretics in her current state      Generalized weakness    Would benefit from Home PT She is no longer driving She declines moving in with her daughter Will see if she can qualify for PCS to help keep her in her home We did discuss if she continues to decline will need to go to a facility of some sort       Essential hypertension    No current meds controlled      Diabetes mellitus ([place])    Controlled with diet         Note: This dictation was prepared with Dragon dictation along with smaller phrase technology. Any transcriptional errors that result from this process are unintentional.

## 2017-10-26 NOTE — Patient Instructions (Addendum)
Remeron take 1/2 tablet daily Wear the compression hose  Physical therapy to be done Nurse Aide to be ordered F/U 1 month

## 2017-10-29 ENCOUNTER — Encounter: Payer: Self-pay | Admitting: Family Medicine

## 2017-10-29 NOTE — Assessment & Plan Note (Addendum)
With mild edema Use compression hose which she has at home Hold on diuretics in her current state

## 2017-10-29 NOTE — Assessment & Plan Note (Signed)
Poor diet, protein malutrition as drinking mostly ensure, despite maintaining her weight She states her appetite is just not good Start remeron 7.5mg  at bedtime to help with appetite, and mood

## 2017-10-29 NOTE — Assessment & Plan Note (Signed)
Controlled with diet

## 2017-10-29 NOTE — Assessment & Plan Note (Signed)
No current meds controlled

## 2017-10-29 NOTE — Assessment & Plan Note (Signed)
Would benefit from Home PT She is no longer driving She declines moving in with her daughter Will see if she can qualify for PCS to help keep her in her home We did discuss if she continues to decline will need to go to a facility of some sort

## 2017-10-31 ENCOUNTER — Telehealth: Payer: Self-pay | Admitting: *Deleted

## 2017-10-31 DIAGNOSIS — E1122 Type 2 diabetes mellitus with diabetic chronic kidney disease: Secondary | ICD-10-CM | POA: Diagnosis not present

## 2017-10-31 DIAGNOSIS — I2510 Atherosclerotic heart disease of native coronary artery without angina pectoris: Secondary | ICD-10-CM | POA: Diagnosis not present

## 2017-10-31 DIAGNOSIS — I129 Hypertensive chronic kidney disease with stage 1 through stage 4 chronic kidney disease, or unspecified chronic kidney disease: Secondary | ICD-10-CM | POA: Diagnosis not present

## 2017-10-31 DIAGNOSIS — E1151 Type 2 diabetes mellitus with diabetic peripheral angiopathy without gangrene: Secondary | ICD-10-CM | POA: Diagnosis not present

## 2017-10-31 DIAGNOSIS — J449 Chronic obstructive pulmonary disease, unspecified: Secondary | ICD-10-CM | POA: Diagnosis not present

## 2017-10-31 DIAGNOSIS — M170 Bilateral primary osteoarthritis of knee: Secondary | ICD-10-CM | POA: Diagnosis not present

## 2017-10-31 DIAGNOSIS — D649 Anemia, unspecified: Secondary | ICD-10-CM | POA: Diagnosis not present

## 2017-10-31 DIAGNOSIS — N183 Chronic kidney disease, stage 3 (moderate): Secondary | ICD-10-CM | POA: Diagnosis not present

## 2017-10-31 DIAGNOSIS — E46 Unspecified protein-calorie malnutrition: Secondary | ICD-10-CM | POA: Diagnosis not present

## 2017-10-31 NOTE — Telephone Encounter (Signed)
noted 

## 2017-10-31 NOTE — Telephone Encounter (Signed)
Received call from Tressia Danas, [facility] with [facility] [phone]~ telephone.   Requested VO to see patient 2x weekly x1 week, then 3x weekly x1 week, then 2x weekly x2 weeks for strengthening, mobility, and motor planning.   VO given.

## 2017-11-01 ENCOUNTER — Telehealth: Payer: Self-pay | Admitting: *Deleted

## 2017-11-01 NOTE — Telephone Encounter (Signed)
Received call from patient daughter Anderson Malta.   Requested order for lightweight wheelchair to be sent to [facility] for increased weakness.   MD please advise.

## 2017-11-05 DIAGNOSIS — I129 Hypertensive chronic kidney disease with stage 1 through stage 4 chronic kidney disease, or unspecified chronic kidney disease: Secondary | ICD-10-CM | POA: Diagnosis not present

## 2017-11-05 DIAGNOSIS — E1122 Type 2 diabetes mellitus with diabetic chronic kidney disease: Secondary | ICD-10-CM | POA: Diagnosis not present

## 2017-11-05 DIAGNOSIS — J449 Chronic obstructive pulmonary disease, unspecified: Secondary | ICD-10-CM | POA: Diagnosis not present

## 2017-11-05 DIAGNOSIS — E1151 Type 2 diabetes mellitus with diabetic peripheral angiopathy without gangrene: Secondary | ICD-10-CM | POA: Diagnosis not present

## 2017-11-05 DIAGNOSIS — D649 Anemia, unspecified: Secondary | ICD-10-CM | POA: Diagnosis not present

## 2017-11-05 DIAGNOSIS — M170 Bilateral primary osteoarthritis of knee: Secondary | ICD-10-CM | POA: Diagnosis not present

## 2017-11-05 DIAGNOSIS — I2510 Atherosclerotic heart disease of native coronary artery without angina pectoris: Secondary | ICD-10-CM | POA: Diagnosis not present

## 2017-11-05 DIAGNOSIS — N183 Chronic kidney disease, stage 3 (moderate): Secondary | ICD-10-CM | POA: Diagnosis not present

## 2017-11-05 DIAGNOSIS — E46 Unspecified protein-calorie malnutrition: Secondary | ICD-10-CM | POA: Diagnosis not present

## 2017-11-07 NOTE — Telephone Encounter (Signed)
New orders signed.

## 2017-11-07 NOTE — Telephone Encounter (Signed)
Received call from [facility].   Was advised that patient has [facility].   Order printed to be resubmitted to [facility] pending MD signature.

## 2017-11-09 ENCOUNTER — Telehealth: Payer: Self-pay | Admitting: *Deleted

## 2017-11-09 DIAGNOSIS — E46 Unspecified protein-calorie malnutrition: Secondary | ICD-10-CM | POA: Diagnosis not present

## 2017-11-09 DIAGNOSIS — J449 Chronic obstructive pulmonary disease, unspecified: Secondary | ICD-10-CM | POA: Diagnosis not present

## 2017-11-09 DIAGNOSIS — I2510 Atherosclerotic heart disease of native coronary artery without angina pectoris: Secondary | ICD-10-CM | POA: Diagnosis not present

## 2017-11-09 DIAGNOSIS — M170 Bilateral primary osteoarthritis of knee: Secondary | ICD-10-CM | POA: Diagnosis not present

## 2017-11-09 DIAGNOSIS — I129 Hypertensive chronic kidney disease with stage 1 through stage 4 chronic kidney disease, or unspecified chronic kidney disease: Secondary | ICD-10-CM | POA: Diagnosis not present

## 2017-11-09 DIAGNOSIS — N183 Chronic kidney disease, stage 3 (moderate): Secondary | ICD-10-CM | POA: Diagnosis not present

## 2017-11-09 DIAGNOSIS — D649 Anemia, unspecified: Secondary | ICD-10-CM | POA: Diagnosis not present

## 2017-11-09 DIAGNOSIS — E1151 Type 2 diabetes mellitus with diabetic peripheral angiopathy without gangrene: Secondary | ICD-10-CM | POA: Diagnosis not present

## 2017-11-09 DIAGNOSIS — E1122 Type 2 diabetes mellitus with diabetic chronic kidney disease: Secondary | ICD-10-CM | POA: Diagnosis not present

## 2017-11-09 NOTE — Telephone Encounter (Signed)
Agree with above 

## 2017-11-09 NOTE — Telephone Encounter (Signed)
Received call from Tressia Danas, [facility] PT with [place] [phone]~ telephone.   Requested VO to add OT eval to services for RUE Weakness and ADL's. VO given.

## 2017-11-12 DIAGNOSIS — E46 Unspecified protein-calorie malnutrition: Secondary | ICD-10-CM | POA: Diagnosis not present

## 2017-11-12 DIAGNOSIS — E1151 Type 2 diabetes mellitus with diabetic peripheral angiopathy without gangrene: Secondary | ICD-10-CM | POA: Diagnosis not present

## 2017-11-12 DIAGNOSIS — N183 Chronic kidney disease, stage 3 (moderate): Secondary | ICD-10-CM | POA: Diagnosis not present

## 2017-11-12 DIAGNOSIS — I2510 Atherosclerotic heart disease of native coronary artery without angina pectoris: Secondary | ICD-10-CM | POA: Diagnosis not present

## 2017-11-12 DIAGNOSIS — J449 Chronic obstructive pulmonary disease, unspecified: Secondary | ICD-10-CM | POA: Diagnosis not present

## 2017-11-12 DIAGNOSIS — D649 Anemia, unspecified: Secondary | ICD-10-CM | POA: Diagnosis not present

## 2017-11-12 DIAGNOSIS — E1122 Type 2 diabetes mellitus with diabetic chronic kidney disease: Secondary | ICD-10-CM | POA: Diagnosis not present

## 2017-11-12 DIAGNOSIS — M170 Bilateral primary osteoarthritis of knee: Secondary | ICD-10-CM | POA: Diagnosis not present

## 2017-11-12 DIAGNOSIS — I129 Hypertensive chronic kidney disease with stage 1 through stage 4 chronic kidney disease, or unspecified chronic kidney disease: Secondary | ICD-10-CM | POA: Diagnosis not present

## 2017-11-13 ENCOUNTER — Inpatient Hospital Stay (HOSPITAL_COMMUNITY): Payer: Medicare HMO | Attending: Hematology

## 2017-11-13 DIAGNOSIS — Z720 Tobacco use: Secondary | ICD-10-CM | POA: Diagnosis not present

## 2017-11-13 DIAGNOSIS — D509 Iron deficiency anemia, unspecified: Secondary | ICD-10-CM | POA: Insufficient documentation

## 2017-11-13 DIAGNOSIS — I10 Essential (primary) hypertension: Secondary | ICD-10-CM | POA: Diagnosis not present

## 2017-11-13 DIAGNOSIS — D649 Anemia, unspecified: Secondary | ICD-10-CM

## 2017-11-13 LAB — COMPREHENSIVE METABOLIC PANEL
ALBUMIN: 3.9 g/dL (ref 3.5–5.0)
ALK PHOS: 50 U/L (ref 38–126)
ALT: 14 U/L (ref 14–54)
ANION GAP: 7 (ref 5–15)
AST: 26 U/L (ref 15–41)
BUN: 21 mg/dL — ABNORMAL HIGH (ref 6–20)
CALCIUM: 9.6 mg/dL (ref 8.9–10.3)
CHLORIDE: 107 mmol/L (ref 101–111)
CO2: 26 mmol/L (ref 22–32)
Creatinine, Ser: 0.94 mg/dL (ref 0.44–1.00)
GFR calc non Af Amer: 57 mL/min — ABNORMAL LOW (ref >60)
GLUCOSE: 100 mg/dL — AB (ref 65–99)
Potassium: 4.4 mmol/L (ref 3.5–5.1)
SODIUM: 140 mmol/L (ref 135–145)
Total Bilirubin: 0.7 mg/dL (ref 0.3–1.2)
Total Protein: 7.1 g/dL (ref 6.5–8.1)

## 2017-11-13 LAB — IRON AND TIBC
IRON: 69 ug/dL (ref 28–170)
Saturation Ratios: 31 % (ref 10.4–31.8)
TIBC: 223 ug/dL — ABNORMAL LOW (ref 250–450)
UIBC: 154 ug/dL

## 2017-11-13 LAB — CBC WITH DIFFERENTIAL/PLATELET
Basophils Absolute: 0 10*3/uL (ref 0.0–0.1)
Basophils Relative: 0 %
Eosinophils Absolute: 0.1 10*3/uL (ref 0.0–0.7)
Eosinophils Relative: 2 %
HEMATOCRIT: 34.2 % — AB (ref 36.0–46.0)
Hemoglobin: 11.5 g/dL — ABNORMAL LOW (ref 12.0–15.0)
Lymphocytes Relative: 42 %
Lymphs Abs: 2.8 10*3/uL (ref 0.7–4.0)
MCH: 30.2 pg (ref 26.0–34.0)
MCHC: 33.6 g/dL (ref 30.0–36.0)
MCV: 89.8 fL (ref 78.0–100.0)
MONO ABS: 0.6 10*3/uL (ref 0.1–1.0)
Monocytes Relative: 9 %
NEUTROS ABS: 3.2 10*3/uL (ref 1.7–7.7)
NEUTROS PCT: 47 %
Platelets: 217 10*3/uL (ref 150–400)
RBC: 3.81 MIL/uL — ABNORMAL LOW (ref 3.87–5.11)
RDW: 13.3 % (ref 11.5–15.5)
WBC: 6.7 10*3/uL (ref 4.0–10.5)

## 2017-11-13 LAB — FERRITIN: FERRITIN: 371 ng/mL — AB (ref 11–307)

## 2017-11-13 LAB — VITAMIN B12: Vitamin B-12: 5152 pg/mL — ABNORMAL HIGH (ref 180–914)

## 2017-11-13 LAB — FOLATE: Folate: 33 ng/mL (ref >5.9)

## 2017-11-14 ENCOUNTER — Inpatient Hospital Stay (HOSPITAL_BASED_OUTPATIENT_CLINIC_OR_DEPARTMENT_OTHER): Payer: Medicare HMO | Admitting: Internal Medicine

## 2017-11-14 ENCOUNTER — Encounter (HOSPITAL_COMMUNITY): Payer: Self-pay | Admitting: Internal Medicine

## 2017-11-14 ENCOUNTER — Other Ambulatory Visit (HOSPITAL_COMMUNITY): Payer: Medicare HMO

## 2017-11-14 VITALS — BP 143/61 | HR 60 | Temp 97.9°F | Resp 16 | Wt 143.4 lb

## 2017-11-14 DIAGNOSIS — I2510 Atherosclerotic heart disease of native coronary artery without angina pectoris: Secondary | ICD-10-CM | POA: Diagnosis not present

## 2017-11-14 DIAGNOSIS — E1122 Type 2 diabetes mellitus with diabetic chronic kidney disease: Secondary | ICD-10-CM | POA: Diagnosis not present

## 2017-11-14 DIAGNOSIS — D649 Anemia, unspecified: Secondary | ICD-10-CM | POA: Diagnosis not present

## 2017-11-14 DIAGNOSIS — E46 Unspecified protein-calorie malnutrition: Secondary | ICD-10-CM | POA: Diagnosis not present

## 2017-11-14 DIAGNOSIS — E1151 Type 2 diabetes mellitus with diabetic peripheral angiopathy without gangrene: Secondary | ICD-10-CM | POA: Diagnosis not present

## 2017-11-14 DIAGNOSIS — I10 Essential (primary) hypertension: Secondary | ICD-10-CM

## 2017-11-14 DIAGNOSIS — M170 Bilateral primary osteoarthritis of knee: Secondary | ICD-10-CM | POA: Diagnosis not present

## 2017-11-14 DIAGNOSIS — I129 Hypertensive chronic kidney disease with stage 1 through stage 4 chronic kidney disease, or unspecified chronic kidney disease: Secondary | ICD-10-CM | POA: Diagnosis not present

## 2017-11-14 DIAGNOSIS — J449 Chronic obstructive pulmonary disease, unspecified: Secondary | ICD-10-CM | POA: Diagnosis not present

## 2017-11-14 DIAGNOSIS — D509 Iron deficiency anemia, unspecified: Secondary | ICD-10-CM

## 2017-11-14 DIAGNOSIS — Z720 Tobacco use: Secondary | ICD-10-CM

## 2017-11-14 DIAGNOSIS — N183 Chronic kidney disease, stage 3 (moderate): Secondary | ICD-10-CM | POA: Diagnosis not present

## 2017-11-14 DIAGNOSIS — F17200 Nicotine dependence, unspecified, uncomplicated: Secondary | ICD-10-CM

## 2017-11-14 NOTE — Progress Notes (Signed)
Diagnosis Normocytic anemia - Plan: CT Chest Wo Contrast, CBC with Differential/Platelet, Comprehensive metabolic panel, Lactate dehydrogenase, Ferritin, Protein electrophoresis, serum  Smoking - Plan: CT Chest Wo Contrast, CBC with Differential/Platelet, Comprehensive metabolic panel, Lactate dehydrogenase, Ferritin, Protein electrophoresis, serum  Staging Cancer Staging No matching staging information was found for the patient.  Assessment and Plan:  1.  IDA.  Pt was previously followed by Dr. Talbert Cage.  She was last treated with IV iron 12/2016.  Labs done 11/13/2017 reviewed with pt and showed WBC 6.7 HB 11.5 and plts 217,000.  Ferritin 371.  Pt  Had prior SPEP done 12/2016 that was negative.  She will RTC in 03/2018 for follow-up and repeat labs.    2.  Smoking.  Cessation is recommended.  She will be set up for CT of chest for lung cancer screening evaluation based on > 30 yr history of smoking.    3.  HTN.  BP is 143/61.  Follow-up with PCP.    4.  Health maintenance.  Continue mammogram and GI evaluation as recommended through PCP.     Current Status:  Pt is seen today for follow-up to go over labs.    Problem List Patient Active Problem List   Diagnosis Date Noted  . Itching [L29.9] 07/19/2017  . Gait instability [R26.81] 02/26/2017  . Symptomatic anemia [D64.9] 08/15/2016  . Protein-calorie malnutrition ([place]) [E46] 08/04/2016  . Helicobacter pylori gastritis [K29.70, B96.81] 07/26/2016  . Generalized weakness [R53.1] 07/09/2016  . Normocytic anemia [D64.9] 07/03/2016  . CHB (complete heart block) ([place]) [I44.2]   . COPD (chronic obstructive pulmonary disease) ([place]) [J44.9]   . Tobacco use disorder [F17.200] 10/15/2015  . Back pain [M54.9] 12/30/2014  . OA (osteoarthritis) [M19.90] 12/30/2014  . Carotid stenosis [I65.29] 03/11/2014  . PVD (peripheral vascular disease) ([place]) [I73.9] 03/11/2014  . Colon cancer screening [Z12.11] 02/04/2014  . Overactive bladder [N32.81]  12/31/2013  . Rash and nonspecific skin eruption [R21] 05/19/2013  . Numbness and tingling-bilat foot [R20.0, R20.2] 03/05/2013  . IBS (irritable bowel syndrome) [K58.9] 12/30/2012  . Peripheral neuropathy [G62.9] 07/01/2012  . Insomnia [G47.00] 07/01/2012  . Chronic kidney disease (CKD), stage III (moderate) ([place]) [N18.3] 08/25/2010  . Cardiovascular disease [I25.10] 08/16/2009  . Cerebrovascular disease [I67.9] 08/16/2009  . Peripheral vascular disease ([place]) [I73.9] 08/16/2009  . Diabetes mellitus ([place]) [E11.9] 10/26/2008  . Hyperlipidemia [E78.5] 10/26/2008  . Gout, unspecified [M10.9] 10/26/2008  . Essential hypertension [I10] 10/26/2008  . Asthmatic bronchitis [J45.909] 10/26/2008  . Gastroesophageal reflux disease [K21.9] 10/26/2008    Past Medical History Past Medical History:  Diagnosis Date  . 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 ASCVD (arteriosclerotic cardiovascular disease)    a. cath in 4/04,-80%LAD; 70% RCA, -> DESx2; residual 60% distal RCA; nl EF  b. Nuc 02/2016 aborted due to bradycardia. Will need to be rescheduled   . Carotid artery occlusion   . Cerebrovascular disease   . CHB (complete heart block) ([place])    a. s/p PPM on 03/24/16 with a St. Jude (serial number [id number]) pacemaker  . Chronic kidney disease (CKD), stage III (moderate) ([facility])   . Clostridium difficile colitis 03/2013   a. 03/2013  . COPD (chronic obstructive pulmonary disease) ([place])   . Coronary disease   . Diabetes mellitus   . Diverticulosis   . GERD (gastroesophageal reflux disease)   . Gout   . H pylori ulcer   . Hyperlipidemia    chose to stop statin, takes Zetia  . Hypertension  not currently on medication  . IBS (irritable bowel syndrome)   . PVD (peripheral vascular disease) ([place])    a. moderate to severely decreased ABI's in 8/08, sig. aortic inflow disease b. repeat ABI's in 2011 improved- mild disease on the left and moderate to severe disease on the right  . S/P  placement of cardiac pacemaker    a. 03/24/16: St. Jude (serial number [id number]) pacemaker  . Symptomatic anemia 08/15/2016  . TIA (transient ischemic attack) 2012  . Tobacco abuse    a. 50 pack years continuing at 1/2 pack per day  . Tobacco use disorder 10/15/2015    Past Surgical History Past Surgical History:  Procedure Laterality Date  . ABDOMINAL HYSTERECTOMY    . carpel tunnel release     bilateral  . CHOLECYSTECTOMY    . COLONOSCOPY  2009  . COLONOSCOPY N/A 08/04/2016   incomplete due to prep.   . COLONOSCOPY N/A 08/05/2016   Dr. Oneida Alar: moderately redundant rectosigmoid and sigmoid, normal TI, single large-mouthed diverticulum in hepatic flexure, external hemorrhoids, path negative for microscopic colitis   . ENTEROSCOPY N/A 08/18/2016   Procedure: ENTEROSCOPY;  Surgeon: Daneil Dolin, MD;  Location: [facility];  Service: Endoscopy;  Laterality: N/A;  Pediatric colonoscope  . EP IMPLANTABLE DEVICE N/A 03/24/2016   Procedure: Pacemaker Implant;  Surgeon: [name], MD;  Location: [facility];  Service: Cardiovascular;  Laterality: N/A;  . ESOPHAGOGASTRODUODENOSCOPY N/A 08/04/2016   Dr. Oneida Alar: non-critical Schatzki's ring, small hiatal hernia, +H.pylori gastritis on path.   Marland Kitchen GIVENS CAPSULE STUDY N/A 08/16/2016   Procedure: GIVENS CAPSULE STUDY;  Surgeon: Daneil Dolin, MD;  Location: [facility];  Service: Endoscopy;  Laterality: N/A;  . TOTAL ABDOMINAL HYSTERECTOMY W/ BILATERAL SALPINGOOPHORECTOMY      Family History Family History  Problem Relation Age of Onset  . Liver disease Mother 39  . Hypertension Mother   . Cerebral aneurysm Father   . Aneurysm Father 41  . Cancer Sister   . Liver disease Brother   . Liver disease Brother   . Liver disease Brother   . Diabetes type II Brother   . Alcohol abuse Brother   . Cancer Sister   . Colon cancer Neg Hx      Social History  reports that she has been smoking cigarettes.  She started smoking about 51  years ago. She has a 50.00 pack-year smoking history. She has never used smokeless tobacco. She reports that she does not drink alcohol or use drugs.  Medications  Current Outpatient Medications:  .  acetaminophen (TYLENOL) 500 MG tablet, Take 500 mg by mouth every 6 (six) hours as needed for mild pain or moderate pain., Disp: , Rfl:  .  albuterol (PROVENTIL HFA;VENTOLIN HFA) 108 (90 Base) MCG/ACT inhaler, Inhale 2 puffs into the lungs every 4 (four) hours as needed for wheezing or shortness of breath., Disp: 1 Inhaler, Rfl: 2 .  ALPRAZolam (XANAX) 0.25 MG tablet, Take 1 tablet (0.25 mg total) by mouth at bedtime as needed., Disp: 30 tablet, Rfl: 1 .  calcium citrate-vitamin D (CITRACAL+D) 315-200 MG-UNIT per tablet, Take 1 tablet by mouth daily.  , Disp: , Rfl:  .  Cranberry 250 MG CAPS, Take 250 mg by mouth daily., Disp: , Rfl:  .  Cyanocobalamin (VITAMIN B 12 PO), Take 1 tablet by mouth daily. , Disp: , Rfl:  .  esomeprazole (NEXIUM) 40 MG capsule, TAKE 1 CAPSULE BY MOUTH DAILY BEFORE BREAKFAST.,  Disp: 30 capsule, Rfl: 11 .  ezetimibe (ZETIA) 10 MG tablet, Take 1 tablet (10 mg total) by mouth daily., Disp: 90 tablet, Rfl: 3 .  ferrous sulfate 325 (65 FE) MG tablet, Take 325 mg by mouth daily with breakfast., Disp: , Rfl:  .  Fluticasone-Salmeterol (ADVAIR) 100-50 MCG/DOSE AEPB, Inhale 1 puff into the lungs 2 (two) times daily. (Patient taking differently: Inhale 1 puff into the lungs as needed. ), Disp: 1 each, Rfl: 3 .  gabapentin (NEURONTIN) 100 MG capsule, TAKE 1 TO 2 CAPSULES BY MOUTH AT BEDTIME FOR NERVE PAIN., Disp: 60 capsule, Rfl: 6 .  hydrOXYzine (ATARAX/VISTARIL) 25 MG tablet, 1 PO Q6H PRN FOR ITCHING. MAY REPEAT A DOSE 1 HOUR AFTER EACH DOSE. NO MORE THAN 8 PILLS/DAY., Disp: 45 tablet, Rfl: 0 .  lubiprostone (AMITIZA) 8 MCG capsule, Take 8 mcg by mouth as needed. , Disp: , Rfl:  .  mirabegron ER (MYRBETRIQ) 25 MG TB24 tablet, Take 1 tablet (25 mg total) by mouth at bedtime. For  bladder (Patient taking differently: Take 25 mg by mouth as needed. For bladder), Disp: 30 tablet, Rfl: 6 .  mirtazapine (REMERON) 15 MG tablet, Take 0.5 tablets (7.5 mg total) by mouth at bedtime. For appetite, Disp: 30 tablet, Rfl: 2 .  Multiple Vitamin (MULTIVITAMIN WITH MINERALS) TABS tablet, Take 1 tablet by mouth daily., Disp: , Rfl:  .  Probiotic Product (PROBIOTIC DAILY PO), Take 1 capsule by mouth daily. , Disp: , Rfl:  .  prochlorperazine (COMPAZINE) 10 MG tablet, Take 1 tablet (10 mg total) by mouth every 6 (six) hours as needed for nausea or vomiting., Disp: 30 tablet, Rfl: 0 .  promethazine (PHENERGAN) 12.5 MG tablet, 1-2 po 30 MINS PRIOR TO MEDS OR MEALS. MAY REPEAT EVERY 4-6 h prn nausea or vomiting, Disp: 40 tablet, Rfl: 1 .  Tiotropium Bromide Monohydrate (SPIRIVA RESPIMAT) 1.25 MCG/ACT AERS, Inhale 2 puffs into the lungs daily. (Patient taking differently: Inhale 2 puffs into the lungs as needed. ), Disp: 1 Inhaler, Rfl: 11  Allergies Ace inhibitors; Aspirin; Codeine; Pineapple; Plasticized base [plastibase]; Ultram [tramadol]; Adhesive [tape]; Naprosyn [naproxen]; Biaxin [clarithromycin]; Clindamycin/lincomycin; Linzess [linaclotide]; Metronidazole; Nsaids; Penicillins; Varenicline tartrate; and Xarelto [rivaroxaban]  Review of Systems Review of Systems - Oncology ROS as per HPI otherwise 12 point ROS is negative.   Physical Exam  Vitals Wt Readings from Last 3 Encounters:  11/14/17 143 lb 7 oz (65.1 kg)  10/26/17 145 lb (65.8 kg)  10/22/17 146 lb (66.2 kg)   Temp Readings from Last 3 Encounters:  11/14/17 97.9 F (36.6 C) (Oral)  10/26/17 97.9 F (36.6 C) (Oral)  10/22/17 98.4 F (36.9 C) (Oral)   BP Readings from Last 3 Encounters:  11/14/17 (!) 143/61  10/26/17 122/68  10/22/17 122/74   Pulse Readings from Last 3 Encounters:  11/14/17 60  10/26/17 60  07/19/17 62   Constitutional: Well-developed, well-nourished, and in no distress.   HENT: Head:  Normocephalic and atraumatic.  Mouth/Throat: No oropharyngeal exudate. Mucosa moist. Eyes: Pupils are equal, round, and reactive to light. Conjunctivae are normal. No scleral icterus.  Neck: Normal range of motion. Neck supple. No JVD present.  Cardiovascular: Normal rate, regular rhythm and normal heart sounds.  Exam reveals no gallop and no friction rub.   No murmur heard. Pulmonary/Chest: Effort normal and breath sounds normal. No respiratory distress. No wheezes.No rales.  Abdominal: Soft. Bowel sounds are normal. No distension. There is no tenderness. There is no guarding.  Musculoskeletal:  No edema or tenderness.  Lymphadenopathy:No cervical, axillary or supraclavicular adenopathy.  Neurological: Alert and oriented to person, place, and time. No cranial nerve deficit.  Skin: Skin is warm and dry. No rash noted. No erythema. No pallor.  Psychiatric: Affect and judgment normal.   Labs Appointment on 11/13/2017  Component Date Value Ref Range Status  . Vitamin B-12 11/13/2017 5,152* 180 - 914 pg/mL Final   Comment: (NOTE) This assay is not validated for testing neonatal or myeloproliferative syndrome specimens for Vitamin B12 levels. Performed at [facility], [place] [address] [place], [place] [address]   . Folate 11/13/2017 33.0  >5.9 ng/mL Final   Performed at [facility], [place] [address], [place], [place] [address]  . Iron 11/13/2017 69  28 - 170 ug/dL Final  . TIBC 11/13/2017 223* 250 - 450 ug/dL Final  . Saturation Ratios 11/13/2017 31  10.4 - 31.8 % Final  . UIBC 11/13/2017 154  ug/dL Final   Performed at [facility], [place] [address] [place], [place] [address]  . Ferritin 11/13/2017 371* 11 - 307 ng/mL Final   Performed at [facility] [address] [place], [place] [address]  . WBC 11/13/2017 6.7  4.0 - 10.5 K/uL Final  . RBC 11/13/2017 3.81* 3.87 - 5.11 MIL/uL Final  . Hemoglobin 11/13/2017 11.5* 12.0 - 15.0 g/dL Final  . HCT 11/13/2017 34.2*  36.0 - 46.0 % Final  . MCV 11/13/2017 89.8  78.0 - 100.0 fL Final  . MCH 11/13/2017 30.2  26.0 - 34.0 pg Final  . MCHC 11/13/2017 33.6  30.0 - 36.0 g/dL Final  . RDW 11/13/2017 13.3  11.5 - 15.5 % Final  . Platelets 11/13/2017 217  150 - 400 K/uL Final  . Neutrophils Relative % 11/13/2017 47  % Final  . Neutro Abs 11/13/2017 3.2  1.7 - 7.7 K/uL Final  . Lymphocytes Relative 11/13/2017 42  % Final  . Lymphs Abs 11/13/2017 2.8  0.7 - 4.0 K/uL Final  . Monocytes Relative 11/13/2017 9  % Final  . Monocytes Absolute 11/13/2017 0.6  0.1 - 1.0 K/uL Final  . Eosinophils Relative 11/13/2017 2  % Final  . Eosinophils Absolute 11/13/2017 0.1  0.0 - 0.7 K/uL Final  . Basophils Relative 11/13/2017 0  % Final  . Basophils Absolute 11/13/2017 0.0  0.0 - 0.1 K/uL Final   Performed at [facility], [address] [place], [place] [address]  . Sodium 11/13/2017 140  135 - 145 mmol/L Final  . Potassium 11/13/2017 4.4  3.5 - 5.1 mmol/L Final  . Chloride 11/13/2017 107  101 - 111 mmol/L Final  . CO2 11/13/2017 26  22 - 32 mmol/L Final  . Glucose, Bld 11/13/2017 100* 65 - 99 mg/dL Final  . BUN 11/13/2017 21* 6 - 20 mg/dL Final  . Creatinine, Ser 11/13/2017 0.94  0.44 - 1.00 mg/dL Final  . Calcium 11/13/2017 9.6  8.9 - 10.3 mg/dL Final  . Total Protein 11/13/2017 7.1  6.5 - 8.1 g/dL Final  . Albumin 11/13/2017 3.9  3.5 - 5.0 g/dL Final  . AST 11/13/2017 26  15 - 41 U/L Final  . ALT 11/13/2017 14  14 - 54 U/L Final  . Alkaline Phosphatase 11/13/2017 50  38 - 126 U/L Final  . Total Bilirubin 11/13/2017 0.7  0.3 - 1.2 mg/dL Final  . GFR calc non Af Amer 11/13/2017 57* >60 mL/min Final  . GFR calc Af Amer 11/13/2017 >  60  >60 mL/min Final   Comment: (NOTE) The eGFR has been calculated using the CKD EPI equation. This calculation has not been validated in all clinical situations. eGFR's persistently <60 mL/min signify possible Chronic Kidney Disease.   Georgiann Hahn gap 11/13/2017 7  5 - 15 Final   Performed at  [facility], [address] [place], [place] [address]     Pathology Orders Placed This Encounter  Procedures  . CT Chest Wo Contrast    Standing Status:   Future    Standing Expiration Date:   11/14/2018    Order Specific Question:   Preferred imaging location?    Answer:   [facility]    Order Specific Question:   Radiology Contrast Protocol - do NOT remove file path    Answer:   [email or web address]  . CBC with Differential/Platelet    Standing Status:   Future    Standing Expiration Date:   11/15/2018  . Comprehensive metabolic panel    Standing Status:   Future    Standing Expiration Date:   11/15/2018  . Lactate dehydrogenase    Standing Status:   Future    Standing Expiration Date:   11/15/2018  . Ferritin    Standing Status:   Future    Standing Expiration Date:   11/15/2018  . Protein electrophoresis, serum    Standing Status:   Future    Standing Expiration Date:   11/15/2018       Charlotte Shutter MD

## 2017-11-15 ENCOUNTER — Telehealth: Payer: Self-pay | Admitting: *Deleted

## 2017-11-15 DIAGNOSIS — J449 Chronic obstructive pulmonary disease, unspecified: Secondary | ICD-10-CM | POA: Diagnosis not present

## 2017-11-15 DIAGNOSIS — E1122 Type 2 diabetes mellitus with diabetic chronic kidney disease: Secondary | ICD-10-CM | POA: Diagnosis not present

## 2017-11-15 DIAGNOSIS — I129 Hypertensive chronic kidney disease with stage 1 through stage 4 chronic kidney disease, or unspecified chronic kidney disease: Secondary | ICD-10-CM | POA: Diagnosis not present

## 2017-11-15 DIAGNOSIS — M170 Bilateral primary osteoarthritis of knee: Secondary | ICD-10-CM | POA: Diagnosis not present

## 2017-11-15 DIAGNOSIS — E46 Unspecified protein-calorie malnutrition: Secondary | ICD-10-CM | POA: Diagnosis not present

## 2017-11-15 DIAGNOSIS — E1151 Type 2 diabetes mellitus with diabetic peripheral angiopathy without gangrene: Secondary | ICD-10-CM | POA: Diagnosis not present

## 2017-11-15 DIAGNOSIS — D649 Anemia, unspecified: Secondary | ICD-10-CM | POA: Diagnosis not present

## 2017-11-15 DIAGNOSIS — I2510 Atherosclerotic heart disease of native coronary artery without angina pectoris: Secondary | ICD-10-CM | POA: Diagnosis not present

## 2017-11-15 DIAGNOSIS — N183 Chronic kidney disease, stage 3 (moderate): Secondary | ICD-10-CM | POA: Diagnosis not present

## 2017-11-15 NOTE — Telephone Encounter (Signed)
Received call from [place], [facility] OT with [place] [phone]~ telephone.   Requested order to extend [facility] OT services 2x weekly x3 weeks for safety, independent ADL's, transfers and upper extremity strengthening. VO given.

## 2017-11-16 NOTE — Telephone Encounter (Signed)
Agree with above 

## 2017-11-19 DIAGNOSIS — E1151 Type 2 diabetes mellitus with diabetic peripheral angiopathy without gangrene: Secondary | ICD-10-CM | POA: Diagnosis not present

## 2017-11-19 DIAGNOSIS — I2510 Atherosclerotic heart disease of native coronary artery without angina pectoris: Secondary | ICD-10-CM | POA: Diagnosis not present

## 2017-11-19 DIAGNOSIS — I129 Hypertensive chronic kidney disease with stage 1 through stage 4 chronic kidney disease, or unspecified chronic kidney disease: Secondary | ICD-10-CM | POA: Diagnosis not present

## 2017-11-19 DIAGNOSIS — N183 Chronic kidney disease, stage 3 (moderate): Secondary | ICD-10-CM | POA: Diagnosis not present

## 2017-11-19 DIAGNOSIS — E46 Unspecified protein-calorie malnutrition: Secondary | ICD-10-CM | POA: Diagnosis not present

## 2017-11-19 DIAGNOSIS — M170 Bilateral primary osteoarthritis of knee: Secondary | ICD-10-CM | POA: Diagnosis not present

## 2017-11-19 DIAGNOSIS — J449 Chronic obstructive pulmonary disease, unspecified: Secondary | ICD-10-CM | POA: Diagnosis not present

## 2017-11-19 DIAGNOSIS — E1122 Type 2 diabetes mellitus with diabetic chronic kidney disease: Secondary | ICD-10-CM | POA: Diagnosis not present

## 2017-11-19 DIAGNOSIS — D649 Anemia, unspecified: Secondary | ICD-10-CM | POA: Diagnosis not present

## 2017-11-21 DIAGNOSIS — N183 Chronic kidney disease, stage 3 (moderate): Secondary | ICD-10-CM | POA: Diagnosis not present

## 2017-11-21 DIAGNOSIS — I129 Hypertensive chronic kidney disease with stage 1 through stage 4 chronic kidney disease, or unspecified chronic kidney disease: Secondary | ICD-10-CM | POA: Diagnosis not present

## 2017-11-21 DIAGNOSIS — I2510 Atherosclerotic heart disease of native coronary artery without angina pectoris: Secondary | ICD-10-CM | POA: Diagnosis not present

## 2017-11-21 DIAGNOSIS — E46 Unspecified protein-calorie malnutrition: Secondary | ICD-10-CM | POA: Diagnosis not present

## 2017-11-21 DIAGNOSIS — E1122 Type 2 diabetes mellitus with diabetic chronic kidney disease: Secondary | ICD-10-CM | POA: Diagnosis not present

## 2017-11-21 DIAGNOSIS — D649 Anemia, unspecified: Secondary | ICD-10-CM | POA: Diagnosis not present

## 2017-11-21 DIAGNOSIS — M170 Bilateral primary osteoarthritis of knee: Secondary | ICD-10-CM | POA: Diagnosis not present

## 2017-11-21 DIAGNOSIS — J449 Chronic obstructive pulmonary disease, unspecified: Secondary | ICD-10-CM | POA: Diagnosis not present

## 2017-11-21 DIAGNOSIS — E1151 Type 2 diabetes mellitus with diabetic peripheral angiopathy without gangrene: Secondary | ICD-10-CM | POA: Diagnosis not present

## 2017-11-22 DIAGNOSIS — E1122 Type 2 diabetes mellitus with diabetic chronic kidney disease: Secondary | ICD-10-CM | POA: Diagnosis not present

## 2017-11-22 DIAGNOSIS — E1151 Type 2 diabetes mellitus with diabetic peripheral angiopathy without gangrene: Secondary | ICD-10-CM | POA: Diagnosis not present

## 2017-11-22 DIAGNOSIS — J449 Chronic obstructive pulmonary disease, unspecified: Secondary | ICD-10-CM | POA: Diagnosis not present

## 2017-11-22 DIAGNOSIS — I129 Hypertensive chronic kidney disease with stage 1 through stage 4 chronic kidney disease, or unspecified chronic kidney disease: Secondary | ICD-10-CM | POA: Diagnosis not present

## 2017-11-22 DIAGNOSIS — D649 Anemia, unspecified: Secondary | ICD-10-CM | POA: Diagnosis not present

## 2017-11-22 DIAGNOSIS — E46 Unspecified protein-calorie malnutrition: Secondary | ICD-10-CM | POA: Diagnosis not present

## 2017-11-22 DIAGNOSIS — I2510 Atherosclerotic heart disease of native coronary artery without angina pectoris: Secondary | ICD-10-CM | POA: Diagnosis not present

## 2017-11-22 DIAGNOSIS — N183 Chronic kidney disease, stage 3 (moderate): Secondary | ICD-10-CM | POA: Diagnosis not present

## 2017-11-22 DIAGNOSIS — M170 Bilateral primary osteoarthritis of knee: Secondary | ICD-10-CM | POA: Diagnosis not present

## 2017-11-23 DIAGNOSIS — N183 Chronic kidney disease, stage 3 (moderate): Secondary | ICD-10-CM | POA: Diagnosis not present

## 2017-11-23 DIAGNOSIS — M170 Bilateral primary osteoarthritis of knee: Secondary | ICD-10-CM | POA: Diagnosis not present

## 2017-11-23 DIAGNOSIS — I2510 Atherosclerotic heart disease of native coronary artery without angina pectoris: Secondary | ICD-10-CM | POA: Diagnosis not present

## 2017-11-23 DIAGNOSIS — D649 Anemia, unspecified: Secondary | ICD-10-CM | POA: Diagnosis not present

## 2017-11-23 DIAGNOSIS — E46 Unspecified protein-calorie malnutrition: Secondary | ICD-10-CM | POA: Diagnosis not present

## 2017-11-23 DIAGNOSIS — E1122 Type 2 diabetes mellitus with diabetic chronic kidney disease: Secondary | ICD-10-CM | POA: Diagnosis not present

## 2017-11-23 DIAGNOSIS — E1151 Type 2 diabetes mellitus with diabetic peripheral angiopathy without gangrene: Secondary | ICD-10-CM | POA: Diagnosis not present

## 2017-11-23 DIAGNOSIS — J449 Chronic obstructive pulmonary disease, unspecified: Secondary | ICD-10-CM | POA: Diagnosis not present

## 2017-11-23 DIAGNOSIS — I129 Hypertensive chronic kidney disease with stage 1 through stage 4 chronic kidney disease, or unspecified chronic kidney disease: Secondary | ICD-10-CM | POA: Diagnosis not present

## 2017-11-27 DIAGNOSIS — I129 Hypertensive chronic kidney disease with stage 1 through stage 4 chronic kidney disease, or unspecified chronic kidney disease: Secondary | ICD-10-CM | POA: Diagnosis not present

## 2017-11-27 DIAGNOSIS — I2510 Atherosclerotic heart disease of native coronary artery without angina pectoris: Secondary | ICD-10-CM | POA: Diagnosis not present

## 2017-11-27 DIAGNOSIS — N183 Chronic kidney disease, stage 3 (moderate): Secondary | ICD-10-CM | POA: Diagnosis not present

## 2017-11-27 DIAGNOSIS — M170 Bilateral primary osteoarthritis of knee: Secondary | ICD-10-CM | POA: Diagnosis not present

## 2017-11-27 DIAGNOSIS — E46 Unspecified protein-calorie malnutrition: Secondary | ICD-10-CM | POA: Diagnosis not present

## 2017-11-27 DIAGNOSIS — E1151 Type 2 diabetes mellitus with diabetic peripheral angiopathy without gangrene: Secondary | ICD-10-CM | POA: Diagnosis not present

## 2017-11-27 DIAGNOSIS — E1122 Type 2 diabetes mellitus with diabetic chronic kidney disease: Secondary | ICD-10-CM | POA: Diagnosis not present

## 2017-11-27 DIAGNOSIS — J449 Chronic obstructive pulmonary disease, unspecified: Secondary | ICD-10-CM | POA: Diagnosis not present

## 2017-11-27 DIAGNOSIS — D649 Anemia, unspecified: Secondary | ICD-10-CM | POA: Diagnosis not present

## 2017-11-28 ENCOUNTER — Ambulatory Visit: Payer: Medicare HMO | Admitting: Family Medicine

## 2017-11-30 ENCOUNTER — Ambulatory Visit (HOSPITAL_COMMUNITY)
Admission: RE | Admit: 2017-11-30 | Discharge: 2017-11-30 | Disposition: A | Discharge status: Home / Self Care | Payer: Medicare HMO | Source: Ambulatory Visit | Attending: Internal Medicine | Admitting: Internal Medicine

## 2017-11-30 DIAGNOSIS — E1151 Type 2 diabetes mellitus with diabetic peripheral angiopathy without gangrene: Secondary | ICD-10-CM | POA: Diagnosis not present

## 2017-11-30 DIAGNOSIS — I129 Hypertensive chronic kidney disease with stage 1 through stage 4 chronic kidney disease, or unspecified chronic kidney disease: Secondary | ICD-10-CM | POA: Diagnosis not present

## 2017-11-30 DIAGNOSIS — D649 Anemia, unspecified: Secondary | ICD-10-CM | POA: Diagnosis not present

## 2017-11-30 DIAGNOSIS — F17200 Nicotine dependence, unspecified, uncomplicated: Secondary | ICD-10-CM | POA: Diagnosis not present

## 2017-11-30 DIAGNOSIS — N183 Chronic kidney disease, stage 3 (moderate): Secondary | ICD-10-CM | POA: Diagnosis not present

## 2017-11-30 DIAGNOSIS — I700 Atherosclerosis of aorta: Secondary | ICD-10-CM | POA: Diagnosis not present

## 2017-11-30 DIAGNOSIS — M170 Bilateral primary osteoarthritis of knee: Secondary | ICD-10-CM | POA: Diagnosis not present

## 2017-11-30 DIAGNOSIS — J439 Emphysema, unspecified: Secondary | ICD-10-CM | POA: Diagnosis not present

## 2017-11-30 DIAGNOSIS — R911 Solitary pulmonary nodule: Secondary | ICD-10-CM | POA: Diagnosis not present

## 2017-11-30 DIAGNOSIS — Z9049 Acquired absence of other specified parts of digestive tract: Secondary | ICD-10-CM | POA: Insufficient documentation

## 2017-11-30 DIAGNOSIS — E46 Unspecified protein-calorie malnutrition: Secondary | ICD-10-CM | POA: Diagnosis not present

## 2017-11-30 DIAGNOSIS — J449 Chronic obstructive pulmonary disease, unspecified: Secondary | ICD-10-CM | POA: Diagnosis not present

## 2017-11-30 DIAGNOSIS — I2510 Atherosclerotic heart disease of native coronary artery without angina pectoris: Secondary | ICD-10-CM | POA: Diagnosis not present

## 2017-11-30 DIAGNOSIS — E1122 Type 2 diabetes mellitus with diabetic chronic kidney disease: Secondary | ICD-10-CM | POA: Diagnosis not present

## 2017-12-03 DIAGNOSIS — E46 Unspecified protein-calorie malnutrition: Secondary | ICD-10-CM | POA: Diagnosis not present

## 2017-12-03 DIAGNOSIS — I129 Hypertensive chronic kidney disease with stage 1 through stage 4 chronic kidney disease, or unspecified chronic kidney disease: Secondary | ICD-10-CM | POA: Diagnosis not present

## 2017-12-03 DIAGNOSIS — E1151 Type 2 diabetes mellitus with diabetic peripheral angiopathy without gangrene: Secondary | ICD-10-CM | POA: Diagnosis not present

## 2017-12-03 DIAGNOSIS — D649 Anemia, unspecified: Secondary | ICD-10-CM | POA: Diagnosis not present

## 2017-12-03 DIAGNOSIS — I2510 Atherosclerotic heart disease of native coronary artery without angina pectoris: Secondary | ICD-10-CM | POA: Diagnosis not present

## 2017-12-03 DIAGNOSIS — N183 Chronic kidney disease, stage 3 (moderate): Secondary | ICD-10-CM | POA: Diagnosis not present

## 2017-12-03 DIAGNOSIS — E1122 Type 2 diabetes mellitus with diabetic chronic kidney disease: Secondary | ICD-10-CM | POA: Diagnosis not present

## 2017-12-03 DIAGNOSIS — M170 Bilateral primary osteoarthritis of knee: Secondary | ICD-10-CM | POA: Diagnosis not present

## 2017-12-03 DIAGNOSIS — J449 Chronic obstructive pulmonary disease, unspecified: Secondary | ICD-10-CM | POA: Diagnosis not present

## 2017-12-04 ENCOUNTER — Other Ambulatory Visit (HOSPITAL_COMMUNITY): Payer: Self-pay | Admitting: Nurse Practitioner

## 2017-12-04 ENCOUNTER — Ambulatory Visit: Payer: Medicare HMO | Admitting: Nurse Practitioner

## 2017-12-04 DIAGNOSIS — R918 Other nonspecific abnormal finding of lung field: Secondary | ICD-10-CM

## 2017-12-05 ENCOUNTER — Ambulatory Visit: Payer: Medicare HMO | Admitting: Vascular Surgery

## 2017-12-05 ENCOUNTER — Encounter (HOSPITAL_COMMUNITY): Payer: Self-pay | Admitting: *Deleted

## 2017-12-05 ENCOUNTER — Inpatient Hospital Stay (HOSPITAL_COMMUNITY): Admission: RE | Admit: 2017-12-05 | Payer: Medicare HMO | Source: Ambulatory Visit

## 2017-12-05 DIAGNOSIS — D649 Anemia, unspecified: Secondary | ICD-10-CM | POA: Diagnosis not present

## 2017-12-05 DIAGNOSIS — N183 Chronic kidney disease, stage 3 (moderate): Secondary | ICD-10-CM | POA: Diagnosis not present

## 2017-12-05 DIAGNOSIS — M170 Bilateral primary osteoarthritis of knee: Secondary | ICD-10-CM | POA: Diagnosis not present

## 2017-12-05 DIAGNOSIS — E1122 Type 2 diabetes mellitus with diabetic chronic kidney disease: Secondary | ICD-10-CM | POA: Diagnosis not present

## 2017-12-05 DIAGNOSIS — E1151 Type 2 diabetes mellitus with diabetic peripheral angiopathy without gangrene: Secondary | ICD-10-CM | POA: Diagnosis not present

## 2017-12-05 DIAGNOSIS — J449 Chronic obstructive pulmonary disease, unspecified: Secondary | ICD-10-CM | POA: Diagnosis not present

## 2017-12-05 DIAGNOSIS — E46 Unspecified protein-calorie malnutrition: Secondary | ICD-10-CM | POA: Diagnosis not present

## 2017-12-05 DIAGNOSIS — I129 Hypertensive chronic kidney disease with stage 1 through stage 4 chronic kidney disease, or unspecified chronic kidney disease: Secondary | ICD-10-CM | POA: Diagnosis not present

## 2017-12-05 DIAGNOSIS — I2510 Atherosclerotic heart disease of native coronary artery without angina pectoris: Secondary | ICD-10-CM | POA: Diagnosis not present

## 2017-12-05 NOTE — Progress Notes (Signed)
Spoke with patient via telephone. Advised her per Dr. Delton Coombes of CT scan results, nodule in right upper lobe and PET-CT scan is needed for further evaluation.  Patient verbalizes understanding.  She is aware that we will call her on Friday with her appointment time for the PET-CT.

## 2017-12-12 ENCOUNTER — Ambulatory Visit (INDEPENDENT_AMBULATORY_CARE_PROVIDER_SITE_OTHER): Payer: Medicare HMO | Admitting: Family Medicine

## 2017-12-12 ENCOUNTER — Encounter: Payer: Self-pay | Admitting: Family Medicine

## 2017-12-12 ENCOUNTER — Ambulatory Visit: Payer: Medicare HMO | Admitting: Family Medicine

## 2017-12-12 ENCOUNTER — Other Ambulatory Visit: Payer: Self-pay

## 2017-12-12 VITALS — BP 140/82 | HR 82 | Temp 98.1°F | Resp 14 | Ht 65.0 in | Wt 146.0 lb

## 2017-12-12 DIAGNOSIS — E46 Unspecified protein-calorie malnutrition: Secondary | ICD-10-CM

## 2017-12-12 DIAGNOSIS — I10 Essential (primary) hypertension: Secondary | ICD-10-CM

## 2017-12-12 DIAGNOSIS — R531 Weakness: Secondary | ICD-10-CM

## 2017-12-12 DIAGNOSIS — R2681 Unsteadiness on feet: Secondary | ICD-10-CM

## 2017-12-12 DIAGNOSIS — E1159 Type 2 diabetes mellitus with other circulatory complications: Secondary | ICD-10-CM | POA: Diagnosis not present

## 2017-12-12 MED ORDER — GABAPENTIN 100 MG PO CAPS: ORAL_CAPSULE | ORAL | 6 refills | Status: DC

## 2017-12-12 MED ORDER — ALPRAZOLAM 0.25 MG PO TABS: 0.2500 mg | ORAL_TABLET | Freq: Every evening | ORAL | 1 refills | Status: DC | PRN

## 2017-12-12 NOTE — Assessment & Plan Note (Signed)
Diet controlled No changes

## 2017-12-12 NOTE — Assessment & Plan Note (Signed)
Discussed  with patient to try small meal at lunch I am not sure why she has this fear of vomiting she does not have any pain or nausea when eating in the evening.  Continue with Ensure.  She has at least gained 1 pound has not lost any weight over the past month.  She will also get her follow-up PET scan discussed with this is for.  Also discussed this with her daughter who will be present with her to get results of this with oncology.

## 2017-12-12 NOTE — Assessment & Plan Note (Signed)
Complete physical therapy.  She will benefit from rolling walker with seat will order this.  Of note also discussed her visit with her daughter this evening.

## 2017-12-12 NOTE — Progress Notes (Addendum)
   Subjective:    Patient ID: Charlotte Henry, female    DOB: 1941/04/15, 77 y.o.   MRN: [id number]  Patient presents for Follow-up (is not fasting)  Pt here to for interim follow up.  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, has PET on 7/22   Last visit placed on remeron 7.5mg  and taking ensure twice a day , weight up 1 pound in past 6 weeks  still has poor appetite, only eats one actual meal a day  She is afraid she will throw up if she eats In the morning , but has not tried to eat  Drinks ensure and coffee in the morning, ensure for lunch No nausea or pain with eating   Swelling has gone down in legs, she does not consistently wear her compresison hose Had Home health OT/PT- discharged last week , typically uses cane or walker      Reviewed medications, has xanax, uses very sparingly    No difficulty with bowels   Review Of Systems:  GEN- denies fatigue, fever, weight loss,+weakness, recent illness HEENT- denies eye drainage, change in vision, nasal discharge, CVS- denies chest pain, palpitations RESP- denies SOB, cough, wheeze ABD- denies N/V, change in stools, abd pain GU- denies dysuria, hematuria, dribbling, incontinence MSK- denies joint pain, muscle aches, injury Neuro- denies headache, dizziness, syncope, seizure activity       Objective:    BP 140/82   Pulse 82   Temp 98.1 F (36.7 C) (Oral)   Resp 14   Ht 5\' 5"  (1.651 m)   Wt 146 lb (66.2 kg)   SpO2 99%   BMI 24.30 kg/m  GEN- NAD, alert and oriented x3 HEENT- PERRL, EOMI, non injected sclera, pink conjunctiva, MMM, oropharynx clear Neck- Supple, no thyromegaly CVS- RRR, 2/6 systolic murmur RESP-CTAB ABD-NABS,soft,NT,ND MSK- decreased ROM upper and lower ext,  EXT-no  edema Pulses- Radial 2+, DP diminished       Assessment & Plan:      Problem List Items Addressed This Visit      Unprioritized   Diabetes  mellitus ([place])    Diet controlled No changes       Relevant Orders   HM DIABETES FOOT EXAM (Completed)   Essential hypertension - Primary    No change to meds BP looks okay      Gait instability    Complete physical therapy.  She will benefit from rolling walker with seat will order this.  Of note also discussed her visit with her daughter this evening.      Generalized weakness   Protein-calorie malnutrition ([place])    Discussed  with patient to try small meal at lunch I am not sure why she has this fear of vomiting she does not have any pain or nausea when eating in the evening.  Continue with Ensure.  She has at least gained 1 pound has not lost any weight over the past month.  She will also get her follow-up PET scan discussed with this is for.  Also discussed this with her daughter who will be present with her to get results of this with oncology.         Note: This dictation was prepared with Dragon dictation along with smaller phrase technology. Any transcriptional errors that result from this process are unintentional.

## 2017-12-12 NOTE — Addendum Note (Signed)
Addended by: [name] F on: 12/12/2017 05:47 PM   Modules accepted: Orders

## 2017-12-12 NOTE — Addendum Note (Signed)
Addended by: [name] F on: 12/12/2017 05:44 PM   Modules accepted: Orders

## 2017-12-12 NOTE — Assessment & Plan Note (Signed)
No change to meds BP looks okay

## 2017-12-12 NOTE — Patient Instructions (Signed)
F/U Mid August  Eat in the middle of the day  Rollator

## 2017-12-19 ENCOUNTER — Ambulatory Visit (INDEPENDENT_AMBULATORY_CARE_PROVIDER_SITE_OTHER): Payer: Medicare HMO | Admitting: Nurse Practitioner

## 2017-12-19 ENCOUNTER — Encounter: Payer: Self-pay | Admitting: Nurse Practitioner

## 2017-12-19 VITALS — BP 117/66 | HR 60 | Temp 98.4°F | Ht 65.0 in | Wt 148.2 lb

## 2017-12-19 DIAGNOSIS — D649 Anemia, unspecified: Secondary | ICD-10-CM

## 2017-12-19 DIAGNOSIS — R531 Weakness: Secondary | ICD-10-CM | POA: Diagnosis not present

## 2017-12-19 DIAGNOSIS — R630 Anorexia: Secondary | ICD-10-CM | POA: Diagnosis not present

## 2017-12-19 NOTE — Assessment & Plan Note (Signed)
Noted persistent generalized weakness.  TSH checked in May of this year and normal.  Iron, folate, B12 normal.  Anemia well managed by hematology.  They did note on a CT of the chest a suspicious lesion concerning for possible primary bronchogenic neoplasm.  She is scheduled for a PET scan.  If she does have primary lung cancer this could explain some element of her weakness and poor appetite.  Generally, hematology feels her weakness and fatigue is likely multifactorial in nature.  We will continue to monitor.  Follow-up in 6 months.

## 2017-12-19 NOTE — Patient Instructions (Signed)
1. Have your PET scan completed as hematology and oncology have recommended. 2. Follow-up with hematology/oncology based on their recommendations. 3. Continue taking her current medications. 4. Try to increase your Glucerna/Ensure to twice a day.  I will see if I can find coupons for you. 5. Return for follow-up in 6 months. 6. Call us if you have any questions or concerns.  At [facility] we value your feedback. You may receive a survey about your visit today. Please share your experience as we strive to create trusting relationships with our patients to provide genuine, compassionate, quality care.  It was great to see you today!  I hope you have a great summer!!

## 2017-12-19 NOTE — Progress Notes (Signed)
Referring Provider: Alycia Rossetti, MD Primary Care Physician:  Alycia Rossetti, MD Primary GI:  Dr. Oneida Alar  Chief Complaint  Patient presents with  . Weakness    f/u    HPI:   Charlotte Henry is a 77 y.o. female who presents for follow-up on weakness.  The patient was last seen in our office 07/19/2017 for GERD, IBS, generalized weakness, itching.  Intermittent itching noted at her last visit and has cortisone which she feels is effective.  Recent HFP prior to her last visit was normal.  Uses Ensure/Glucerna twice a day.  Has a bowel movement 2-3 times a week consistently [place] 5.  Feels tired.  Recommended drink green tea or tumor daily, add Centrum Silver daily for multivitamin supplementation.  Add minimum 8 ounces of water with 2 teaspoons of [place] vinegar, use hydroxyzine 1-1/2 to 1 tablet as needed for itching, continue B12 and iron.  Follow-up in 6 months.  She is followed by hematology for normocytic anemia.  She saw hematology 07/17/2017.  Initial work-up was negative except for mild iron deficiency for which she was treated.  At her last visit she was feeling "okay."  Her appetite was 50%, energy levels 25%.  No frank bleeding.  On oral iron and B12 supplements.  Her biggest concerns were decreased appetite and significant weight loss of about 70 pounds in 2 years.  Chronic heart failure with pacemaker implanted.  They noted hemoglobin stable.  Decreased appetite nausea likely multifactorial.  She was seen by the dietitian during her visit.  She can followed up with hematology/oncology 11/14/2017 and a CT chest with additional labs were ordered related to her normocytic anemia.  This was also because she currently smokes with a greater than 30-year history of smoking.  Recent CBC completed 11/13/2017 which found mild anemia with a hemoglobin 11.5 which is stable/improved from her baseline.  Ferritin normal/elevated, iron normal.  Folate normal.  Vitamin B12 was  elevated.  CT of the chest was completed 12/03/2017 which found 11 mm irregular nodule in the medial right upper lobe worrisome for possible primary bronchogenic neoplasm.  Recommended consideration of referral to multidisciplinary tumor board possible PET scan versus 69-month follow-up CT of the chest.  She is scheduled to have a PET scan on 12/24/2017.  Today she states she's doing ok. Her sister is not able to walk currently and is being seen by home health. States she has no energy. Poor appetite, only eating 1 meal a day; using Glucerna/Ensure about once a day ("when I can buy them."). Her weight s objectively stable compared to 5 months ago. Denies abdominal pain. Denies further nausea. Denies hematochezia, melena, fever, chills, further weight loss. States overall she "feels well, if I could just get some energy back.) Denies chest pain, dyspnea, dizziness, lightheadedness, syncope, near syncope. Denies any other upper or lower GI symptoms.  Past Medical History:  Diagnosis Date  . 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 ASCVD (arteriosclerotic cardiovascular disease)    a. cath in 4/04,-80%LAD; 70% RCA, -> DESx2; residual 60% distal RCA; nl EF  b. Nuc 02/2016 aborted due to bradycardia. Will need to be rescheduled   . Carotid artery occlusion   . Cerebrovascular disease   . CHB (complete heart block) ([place])    a. s/p PPM on 03/24/16 with a St. Jude (serial number [id number]) pacemaker  . Chronic kidney disease (CKD), stage III (moderate) ([facility])   . Clostridium difficile colitis 03/2013  a. 03/2013  . COPD (chronic obstructive pulmonary disease) ([place])   . Coronary disease   . Diabetes mellitus   . Diverticulosis   . GERD (gastroesophageal reflux disease)   . Gout   . H pylori ulcer   . Hyperlipidemia    chose to stop statin, takes Zetia  . Hypertension    not currently on medication  . IBS (irritable bowel syndrome)   . PVD (peripheral vascular disease) ([place])    a. moderate to  severely decreased ABI's in 8/08, sig. aortic inflow disease b. repeat ABI's in 2011 improved- mild disease on the left and moderate to severe disease on the right  . S/P placement of cardiac pacemaker    a. 03/24/16: St. Jude (serial number [id number]) pacemaker  . Symptomatic anemia 08/15/2016  . TIA (transient ischemic attack) 2012  . Tobacco abuse    a. 50 pack years continuing at 1/2 pack per day  . Tobacco use disorder 10/15/2015    Past Surgical History:  Procedure Laterality Date  . ABDOMINAL HYSTERECTOMY    . carpel tunnel release     bilateral  . CHOLECYSTECTOMY    . COLONOSCOPY  2009  . COLONOSCOPY N/A 08/04/2016   incomplete due to prep.   . COLONOSCOPY N/A 08/05/2016   Dr. Oneida Alar: moderately redundant rectosigmoid and sigmoid, normal TI, single large-mouthed diverticulum in hepatic flexure, external hemorrhoids, path negative for microscopic colitis   . ENTEROSCOPY N/A 08/18/2016   Procedure: ENTEROSCOPY;  Surgeon: Daneil Dolin, MD;  Location: [facility];  Service: Endoscopy;  Laterality: N/A;  Pediatric colonoscope  . EP IMPLANTABLE DEVICE N/A 03/24/2016   Procedure: Pacemaker Implant;  Surgeon: [name], MD;  Location: [facility];  Service: Cardiovascular;  Laterality: N/A;  . ESOPHAGOGASTRODUODENOSCOPY N/A 08/04/2016   Dr. Oneida Alar: non-critical Schatzki's ring, small hiatal hernia, +H.pylori gastritis on path.   Marland Kitchen GIVENS CAPSULE STUDY N/A 08/16/2016   Procedure: GIVENS CAPSULE STUDY;  Surgeon: Daneil Dolin, MD;  Location: [facility];  Service: Endoscopy;  Laterality: N/A;  . TOTAL ABDOMINAL HYSTERECTOMY W/ BILATERAL SALPINGOOPHORECTOMY      Current Outpatient Medications  Medication Sig Dispense Refill  . acetaminophen (TYLENOL) 500 MG tablet Take 500 mg by mouth every 6 (six) hours as needed for mild pain or moderate pain.    Marland Kitchen albuterol (PROVENTIL HFA;VENTOLIN HFA) 108 (90 Base) MCG/ACT inhaler Inhale 2 puffs into the lungs every 4 (four) hours as needed  for wheezing or shortness of breath. 1 Inhaler 2  . ALPRAZolam (XANAX) 0.25 MG tablet Take 1 tablet (0.25 mg total) by mouth at bedtime as needed. 30 tablet 1  . calcium citrate-vitamin D (CITRACAL+D) 315-200 MG-UNIT per tablet Take 1 tablet by mouth daily.      . Cranberry 250 MG CAPS Take 250 mg by mouth daily.    . Cyanocobalamin (VITAMIN B 12 PO) Take 1 tablet by mouth daily.     Marland Kitchen esomeprazole (NEXIUM) 40 MG capsule TAKE 1 CAPSULE BY MOUTH DAILY BEFORE BREAKFAST. 30 capsule 11  . ezetimibe (ZETIA) 10 MG tablet Take 1 tablet (10 mg total) by mouth daily. 90 tablet 3  . ferrous sulfate 325 (65 FE) MG tablet Take 325 mg by mouth daily with breakfast.    . Fluticasone-Salmeterol (ADVAIR) 100-50 MCG/DOSE AEPB Inhale 1 puff into the lungs 2 (two) times daily. (Patient taking differently: Inhale 1 puff into the lungs as needed. ) 1 each 3  . gabapentin (NEURONTIN) 100 MG capsule  TAKE 1 TO 2 CAPSULES BY MOUTH AT BEDTIME FOR NERVE PAIN. 60 capsule 6  . hydrOXYzine (ATARAX/VISTARIL) 25 MG tablet 1 PO Q6H PRN FOR ITCHING. MAY REPEAT A DOSE 1 HOUR AFTER EACH DOSE. NO MORE THAN 8 PILLS/DAY. 45 tablet 0  . lubiprostone (AMITIZA) 8 MCG capsule Take 8 mcg by mouth as needed.     . mirabegron ER (MYRBETRIQ) 25 MG TB24 tablet Take 1 tablet (25 mg total) by mouth at bedtime. For bladder (Patient taking differently: Take 25 mg by mouth as needed. For bladder) 30 tablet 6  . mirtazapine (REMERON) 15 MG tablet Take 0.5 tablets (7.5 mg total) by mouth at bedtime. For appetite 30 tablet 2  . Multiple Vitamin (MULTIVITAMIN WITH MINERALS) TABS tablet Take 1 tablet by mouth daily.    . Probiotic Product (PROBIOTIC DAILY PO) Take 1 capsule by mouth daily.     . Tiotropium Bromide Monohydrate (SPIRIVA RESPIMAT) 1.25 MCG/ACT AERS Inhale 2 puffs into the lungs daily. (Patient taking differently: Inhale 2 puffs into the lungs as needed. ) 1 Inhaler 11   No current facility-administered medications for this visit.      Allergies as of 12/19/2017 - Review Complete 12/19/2017  Allergen Reaction Noted  . Ace inhibitors Hives, Shortness Of Breath, and Swelling 10/27/2008  . Aspirin Other (See Comments) 10/27/2008  . Codeine Swelling 02/02/2011  . Pineapple Shortness Of Breath and Swelling 09/03/2013  . Plasticized base [plastibase] Hives and Swelling 02/07/2012  . Ultram [tramadol] Swelling 03/21/2012  . Adhesive [tape] Swelling and Rash 02/07/2012  . Naprosyn [naproxen] Swelling 02/02/2011  . Biaxin [clarithromycin] Nausea And Vomiting and Other (See Comments) 07/04/2016  . Clindamycin/lincomycin Other (See Comments) 05/04/2014  . Linzess [linaclotide]  02/26/2013  . Metronidazole  06/29/2016  . Nsaids  03/04/2012  . Penicillins  05/04/2014  . Varenicline tartrate Swelling 10/27/2008  . Xarelto [rivaroxaban]  06/20/2017    Family History  Problem Relation Age of Onset  . Liver disease Mother 55  . Hypertension Mother   . Cerebral aneurysm Father   . Aneurysm Father 58  . Cancer Sister   . Liver disease Brother   . Liver disease Brother   . Liver disease Brother   . Diabetes type II Brother   . Alcohol abuse Brother   . Cancer Sister   . Colon cancer Neg Hx     Social History   Socioeconomic History  . Marital status: Widowed    Spouse name: Not on file  . Number of children: Not on file  . Years of education: Not on file  . Highest education level: Not on file  Occupational History  . Not on file  Social Needs  . Financial resource strain: Not on file  . Food insecurity:    Worry: Not on file    Inability: Not on file  . Transportation needs:    Medical: Not on file    Non-medical: Not on file  Tobacco Use  . Smoking status: Current Every Day Smoker    Packs/day: 1.00    Years: 50.00    Pack years: 50.00    Types: Cigarettes    Start date: 02/02/1966  . Smokeless tobacco: Never Used  . Tobacco comment: 1/2 pack daily  Substance and Sexual Activity  . Alcohol use: No     Alcohol/week: 0.0 oz    Comment: H/O of 6 pack per day quiting in 2003  . Drug use: No  . Sexual activity: Not Currently  Lifestyle  .  Physical activity:    Days per week: Not on file    Minutes per session: Not on file  . Stress: Not on file  Relationships  . Social connections:    Talks on phone: Not on file    Gets together: Not on file    Attends religious service: Not on file    Active member of club or organization: Not on file    Attends meetings of clubs or organizations: Not on file    Relationship status: Not on file  Other Topics Concern  . Not on file  Social History Narrative  . Not on file    Review of Systems: Complete ROS negative except as per HPI.   Physical Exam: BP 117/66   Pulse 60   Temp 98.4 F (36.9 C) (Oral)   Ht 5\' 5"  (1.651 m)   Wt 148 lb 3.2 oz (67.2 kg)   BMI 24.66 kg/m  General:   Alert and oriented. Pleasant and cooperative. Well-nourished and well-developed.  Eyes:  Without icterus, sclera clear and conjunctiva pink.  Ears:  Normal auditory acuity. Cardiovascular:  S1, S2 present without murmurs appreciated. Extremities without clubbing or edema. Respiratory:  Clear to auscultation bilaterally. No wheezes, rales, or rhonchi. No distress.  Gastrointestinal:  +BS, soft, non-tender and non-distended. No HSM noted. No guarding or rebound. No masses appreciated.  Rectal:  Deferred  Musculoskalatal:  Symmetrical without gross deformities. Neurologic:  Alert and oriented x4;  grossly normal neurologically. Psych:  Alert and cooperative. Normal mood and affect. Heme/Lymph/Immune: No excessive bruising noted.    12/19/2017 11:38 AM   Disclaimer: This note was dictated with voice recognition software. Similar sounding words can inadvertently be transcribed and may not be corrected upon review.

## 2017-12-19 NOTE — Assessment & Plan Note (Signed)
Patient has a poor appetite and only eats one meal a day.  She takes Glucerna or Ensure once a day, recommended twice a day.  She states it is difficult to afford it to be able to take it as often as recommended.  I will attempt to obtain coupons for her if they are available.  Recommend increase Glucerna or Ensure as able to twice a day.  Follow-up in 6 months.

## 2017-12-19 NOTE — Progress Notes (Signed)
cc'ed to pcp °

## 2017-12-19 NOTE — Assessment & Plan Note (Signed)
Normocytic anemia followed by hematology.  Her labs have been good recently.  She continues to see hematology.  Her B12, iron, folate are all normal as well.  Continue to monitor based on hematology recommendations.  Follow-up in 6 months.

## 2017-12-24 ENCOUNTER — Encounter (HOSPITAL_COMMUNITY)
Admission: RE | Admit: 2017-12-24 | Discharge: 2017-12-24 | Disposition: A | Discharge status: Home / Self Care | Payer: Medicare HMO | Source: Ambulatory Visit | Attending: Nurse Practitioner | Admitting: Nurse Practitioner

## 2017-12-24 ENCOUNTER — Encounter: Payer: Self-pay | Admitting: Family Medicine

## 2017-12-24 DIAGNOSIS — R918 Other nonspecific abnormal finding of lung field: Secondary | ICD-10-CM | POA: Insufficient documentation

## 2017-12-24 DIAGNOSIS — R911 Solitary pulmonary nodule: Secondary | ICD-10-CM | POA: Diagnosis not present

## 2017-12-24 MED ORDER — FLUDEOXYGLUCOSE F - 18 (FDG) INJECTION
8.2000 | Freq: Once | INTRAVENOUS | Status: AC | PRN
  Administered 2017-12-24: 9.2 via INTRAVENOUS

## 2017-12-26 ENCOUNTER — Encounter (HOSPITAL_COMMUNITY): Payer: Self-pay | Admitting: Internal Medicine

## 2017-12-26 ENCOUNTER — Other Ambulatory Visit: Payer: Self-pay

## 2017-12-26 ENCOUNTER — Inpatient Hospital Stay (HOSPITAL_COMMUNITY): Payer: Medicare HMO | Attending: Hematology | Admitting: Internal Medicine

## 2017-12-26 ENCOUNTER — Encounter (HOSPITAL_COMMUNITY): Payer: Self-pay | Admitting: Dietician

## 2017-12-26 VITALS — BP 135/93 | HR 62 | Temp 98.2°F | Resp 18 | Wt 144.7 lb

## 2017-12-26 DIAGNOSIS — R634 Abnormal weight loss: Secondary | ICD-10-CM | POA: Insufficient documentation

## 2017-12-26 DIAGNOSIS — I10 Essential (primary) hypertension: Secondary | ICD-10-CM | POA: Insufficient documentation

## 2017-12-26 DIAGNOSIS — Z720 Tobacco use: Secondary | ICD-10-CM | POA: Insufficient documentation

## 2017-12-26 DIAGNOSIS — R911 Solitary pulmonary nodule: Secondary | ICD-10-CM

## 2017-12-26 DIAGNOSIS — D508 Other iron deficiency anemias: Secondary | ICD-10-CM

## 2017-12-26 DIAGNOSIS — D509 Iron deficiency anemia, unspecified: Secondary | ICD-10-CM | POA: Insufficient documentation

## 2017-12-26 NOTE — Progress Notes (Signed)
Diagnosis Other iron deficiency anemia - Plan: CBC with Differential/Platelet, Comprehensive metabolic panel, Lactate dehydrogenase, Ferritin  Staging Cancer Staging No matching staging information was found for the patient.  Assessment and Plan:  1.  IDA.  Pt was previously followed by Dr. Talbert Cage.  She was last treated with IV iron 12/2016.  Labs done 11/13/2017 showed WBC 6.7 HB 11.5 and plts 217,000.  Ferritin 371.  Pt  Had prior SPEP done 12/2016 that was negative.  She will RTC in 01/2018 for follow-up and repeat labs after CT surgery evaluation.    2.  Smoking. Pt was set up for CT of chest for lung cancer screening evaluation based on > 30 yr history of smoking.  CT of chest done 11/30/2017 showed  IMPRESSION: 11 mm irregular nodule in the medial right upper lobe, worrisome for possible primary bronchogenic neoplasm. Consider referral to multidisciplinary tumor board and possible PET-CT versus 3 month follow-up CT chest.  PET scan that was done 12/24/2017 was reviewed and showed  IMPRESSION: 10 mm hypermetabolic pulmonary nodule in the anterior and medial right upper lobe, highly suspicious for bronchogenic carcinoma.  No evidence of local or distant metastatic disease.  I have personally reviewed films with pt and family.  She will be referred to Dr. Roxan Hockey for CT surgery evaluation.  Pending CT surgery evaluation, she will RTC in 01/2018 for follow-up to go over recommendations.   All questions answered and pt and family expressed understanding of the information presented.    3.  Weight loss.  Pt has been seen by GI who has recommended nutritional supplements.  I have discussed with pt and family CT scan findings if consistent with malignancy may also play a role in weight loss.    4.  HTN.  BP is 135/93.  Follow-up with PCP.    5.  Health maintenance.  Pt should continue mammogram and GI follow-up as recommended.     Current Status:  Pt is seen today for follow-up to go  over labs.    Problem List Patient Active Problem List   Diagnosis Date Noted  . Poor appetite [R63.0] 12/19/2017  . Itching [L29.9] 07/19/2017  . Gait instability [R26.81] 02/26/2017  . Symptomatic anemia [D64.9] 08/15/2016  . Protein-calorie malnutrition ([place]) [E46] 08/04/2016  . Helicobacter pylori gastritis [K29.70, B96.81] 07/26/2016  . Generalized weakness [R53.1] 07/09/2016  . Normocytic anemia [D64.9] 07/03/2016  . CHB (complete heart block) ([place]) [I44.2]   . COPD (chronic obstructive pulmonary disease) ([place]) [J44.9]   . Tobacco use disorder [F17.200] 10/15/2015  . Back pain [M54.9] 12/30/2014  . OA (osteoarthritis) [M19.90] 12/30/2014  . Carotid stenosis [I65.29] 03/11/2014  . PVD (peripheral vascular disease) ([place]) [I73.9] 03/11/2014  . Colon cancer screening [Z12.11] 02/04/2014  . Overactive bladder [N32.81] 12/31/2013  . Rash and nonspecific skin eruption [R21] 05/19/2013  . Numbness and tingling-bilat foot [R20.0, R20.2] 03/05/2013  . IBS (irritable bowel syndrome) [K58.9] 12/30/2012  . Peripheral neuropathy [G62.9] 07/01/2012  . Insomnia [G47.00] 07/01/2012  . Chronic kidney disease (CKD), stage III (moderate) ([place]) [N18.3] 08/25/2010  . Cardiovascular disease [I25.10] 08/16/2009  . Cerebrovascular disease [I67.9] 08/16/2009  . Peripheral vascular disease ([place]) [I73.9] 08/16/2009  . Diabetes mellitus ([place]) [E11.9] 10/26/2008  . Hyperlipidemia [E78.5] 10/26/2008  . Gout, unspecified [M10.9] 10/26/2008  . Essential hypertension [I10] 10/26/2008  . Asthmatic bronchitis [J45.909] 10/26/2008  . Gastroesophageal reflux disease [K21.9] 10/26/2008    Past Medical History Past Medical History:  Diagnosis Date  . 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  ASCVD (arteriosclerotic cardiovascular disease)    a. cath in 4/04,-80%LAD; 70% RCA, -> DESx2; residual 60% distal RCA; nl EF  b. Nuc 02/2016 aborted due to bradycardia. Will need to be rescheduled   . Carotid artery  occlusion   . Cerebrovascular disease   . CHB (complete heart block) ([place])    a. s/p PPM on 03/24/16 with a St. Jude (serial number [id number]) pacemaker  . Chronic kidney disease (CKD), stage III (moderate) ([facility])   . Clostridium difficile colitis 03/2013   a. 03/2013  . COPD (chronic obstructive pulmonary disease) ([place])   . Coronary disease   . Diabetes mellitus   . Diverticulosis   . GERD (gastroesophageal reflux disease)   . Gout   . H pylori ulcer   . Hyperlipidemia    chose to stop statin, takes Zetia  . Hypertension    not currently on medication  . IBS (irritable bowel syndrome)   . PVD (peripheral vascular disease) ([place])    a. moderate to severely decreased ABI's in 8/08, sig. aortic inflow disease b. repeat ABI's in 2011 improved- mild disease on the left and moderate to severe disease on the right  . S/P placement of cardiac pacemaker    a. 03/24/16: St. Jude (serial number [id number]) pacemaker  . Symptomatic anemia 08/15/2016  . TIA (transient ischemic attack) 2012  . Tobacco abuse    a. 50 pack years continuing at 1/2 pack per day  . Tobacco use disorder 10/15/2015    Past Surgical History Past Surgical History:  Procedure Laterality Date  . ABDOMINAL HYSTERECTOMY    . carpel tunnel release     bilateral  . CHOLECYSTECTOMY    . COLONOSCOPY  2009  . COLONOSCOPY N/A 08/04/2016   incomplete due to prep.   . COLONOSCOPY N/A 08/05/2016   Dr. Oneida Alar: moderately redundant rectosigmoid and sigmoid, normal TI, single large-mouthed diverticulum in hepatic flexure, external hemorrhoids, path negative for microscopic colitis   . ENTEROSCOPY N/A 08/18/2016   Procedure: ENTEROSCOPY;  Surgeon: Daneil Dolin, MD;  [facility];  Service: Endoscopy;  Laterality: N/A;  Pediatric colonoscope  . EP IMPLANTABLE DEVICE N/A 03/24/2016   Procedure: Pacemaker Implant;  Surgeon: [name], MD;  Location: [facility];  Service: Cardiovascular;  Laterality: N/A;  .  ESOPHAGOGASTRODUODENOSCOPY N/A 08/04/2016   Dr. Oneida Alar: non-critical Schatzki's ring, small hiatal hernia, +H.pylori gastritis on path.   Marland Kitchen GIVENS CAPSULE STUDY N/A 08/16/2016   Procedure: GIVENS CAPSULE STUDY;  Surgeon: Daneil Dolin, MD;  Location: [facility];  Service: Endoscopy;  Laterality: N/A;  . TOTAL ABDOMINAL HYSTERECTOMY W/ BILATERAL SALPINGOOPHORECTOMY      Family History Family History  Problem Relation Age of Onset  . Liver disease Mother 20  . Hypertension Mother   . Cerebral aneurysm Father   . Aneurysm Father 31  . Cancer Sister   . Liver disease Brother   . Liver disease Brother   . Liver disease Brother   . Diabetes type II Brother   . Alcohol abuse Brother   . Cancer Sister   . Colon cancer Neg Hx      Social History  reports that she has been smoking cigarettes.  She started smoking about 51 years ago. She has a 50.00 pack-year smoking history. She has never used smokeless tobacco. She reports that she does not drink alcohol or use drugs.  Medications  Current Outpatient Medications:  .  acetaminophen (TYLENOL) 500 MG tablet, Take 500  mg by mouth every 6 (six) hours as needed for mild pain or moderate pain., Disp: , Rfl:  .  albuterol (PROVENTIL HFA;VENTOLIN HFA) 108 (90 Base) MCG/ACT inhaler, Inhale 2 puffs into the lungs every 4 (four) hours as needed for wheezing or shortness of breath., Disp: 1 Inhaler, Rfl: 2 .  ALPRAZolam (XANAX) 0.25 MG tablet, Take 1 tablet (0.25 mg total) by mouth at bedtime as needed., Disp: 30 tablet, Rfl: 1 .  calcium citrate-vitamin D (CITRACAL+D) 315-200 MG-UNIT per tablet, Take 1 tablet by mouth daily.  , Disp: , Rfl:  .  Cranberry 250 MG CAPS, Take 250 mg by mouth daily., Disp: , Rfl:  .  Cyanocobalamin (VITAMIN B 12 PO), Take 1 tablet by mouth daily. , Disp: , Rfl:  .  esomeprazole (NEXIUM) 40 MG capsule, TAKE 1 CAPSULE BY MOUTH DAILY BEFORE BREAKFAST., Disp: 30 capsule, Rfl: 11 .  ezetimibe (ZETIA) 10 MG tablet, Take 1  tablet (10 mg total) by mouth daily., Disp: 90 tablet, Rfl: 3 .  ferrous sulfate 325 (65 FE) MG tablet, Take 325 mg by mouth daily with breakfast., Disp: , Rfl:  .  Fluticasone-Salmeterol (ADVAIR) 100-50 MCG/DOSE AEPB, Inhale 1 puff into the lungs 2 (two) times daily. (Patient taking differently: Inhale 1 puff into the lungs as needed. ), Disp: 1 each, Rfl: 3 .  gabapentin (NEURONTIN) 100 MG capsule, TAKE 1 TO 2 CAPSULES BY MOUTH AT BEDTIME FOR NERVE PAIN., Disp: 60 capsule, Rfl: 6 .  hydrOXYzine (ATARAX/VISTARIL) 25 MG tablet, 1 PO Q6H PRN FOR ITCHING. MAY REPEAT A DOSE 1 HOUR AFTER EACH DOSE. NO MORE THAN 8 PILLS/DAY., Disp: 45 tablet, Rfl: 0 .  lubiprostone (AMITIZA) 8 MCG capsule, Take 8 mcg by mouth as needed. , Disp: , Rfl:  .  mirabegron ER (MYRBETRIQ) 25 MG TB24 tablet, Take 1 tablet (25 mg total) by mouth at bedtime. For bladder (Patient taking differently: Take 25 mg by mouth as needed. For bladder), Disp: 30 tablet, Rfl: 6 .  mirtazapine (REMERON) 15 MG tablet, Take 0.5 tablets (7.5 mg total) by mouth at bedtime. For appetite, Disp: 30 tablet, Rfl: 2 .  Multiple Vitamin (MULTIVITAMIN WITH MINERALS) TABS tablet, Take 1 tablet by mouth daily., Disp: , Rfl:  .  Probiotic Product (PROBIOTIC DAILY PO), Take 1 capsule by mouth daily. , Disp: , Rfl:  .  Tiotropium Bromide Monohydrate (SPIRIVA RESPIMAT) 1.25 MCG/ACT AERS, Inhale 2 puffs into the lungs daily. (Patient taking differently: Inhale 2 puffs into the lungs as needed. ), Disp: 1 Inhaler, Rfl: 11  Allergies Ace inhibitors; Aspirin; Codeine; Pineapple; Plasticized base [plastibase]; Ultram [tramadol]; Adhesive [tape]; Naprosyn [naproxen]; Biaxin [clarithromycin]; Clindamycin/lincomycin; Linzess [linaclotide]; Metronidazole; Nsaids; Penicillins; Varenicline tartrate; and Xarelto [rivaroxaban]  Review of Systems Review of Systems - Oncology ROS negative other than weight loss   Physical Exam  Vitals Wt Readings from Last 3 Encounters:   12/26/17 144 lb 11.2 oz (65.6 kg)  12/19/17 148 lb 3.2 oz (67.2 kg)  12/12/17 146 lb (66.2 kg)   Temp Readings from Last 3 Encounters:  12/26/17 98.2 F (36.8 C) (Oral)  12/19/17 98.4 F (36.9 C) (Oral)  12/12/17 98.1 F (36.7 C) (Oral)   BP Readings from Last 3 Encounters:  12/26/17 (!) 135/93  12/19/17 117/66  12/12/17 140/82   Pulse Readings from Last 3 Encounters:  12/26/17 62  12/19/17 60  12/12/17 82   Constitutional: Well-developed, well-nourished, and in no distress.   HENT: Head: Normocephalic and atraumatic.  Mouth/Throat: No  oropharyngeal exudate. Mucosa moist. Eyes: Pupils are equal, round, and reactive to light. Conjunctivae are normal. No scleral icterus.  Neck: Normal range of motion. Neck supple. No JVD present.  Cardiovascular: Normal rate, regular rhythm and normal heart sounds.  Exam reveals no gallop and no friction rub.   No murmur heard. Pulmonary/Chest: Effort normal and breath sounds normal. No respiratory distress. No wheezes.No rales.  Abdominal: Soft. Bowel sounds are normal. No distension. There is no tenderness. There is no guarding.  Musculoskeletal: No edema or tenderness.  Lymphadenopathy: No cervical, axillary or supraclavicular adenopathy.  Neurological: Alert and oriented to person, place, and time. No cranial nerve deficit.  Skin: Skin is warm and dry. No rash noted. No erythema. No pallor.  Psychiatric: Affect and judgment normal.   Labs No visits with results within 3 Day(s) from this visit.  Latest known visit with results is:  Appointment on 11/13/2017  Component Date Value Ref Range Status  . Vitamin B-12 11/13/2017 5,152* 180 - 914 pg/mL Final   Comment: (NOTE) This assay is not validated for testing neonatal or myeloproliferative syndrome specimens for Vitamin B12 levels. Performed at [facility], [place] [address] [place], [place] [address]   . Folate 11/13/2017 33.0  >5.9 ng/mL Final   Performed at [facility], [place] [address] [place], [place] [address]  . Iron 11/13/2017 69  28 - 170 ug/dL Final  . TIBC 11/13/2017 223* 250 - 450 ug/dL Final  . Saturation Ratios 11/13/2017 31  10.4 - 31.8 % Final  . UIBC 11/13/2017 154  ug/dL Final   Performed at [facility], [place] [address], [place], [place] [address]  . Ferritin 11/13/2017 371* 11 - 307 ng/mL Final   Performed at [facility] [address] [place], [place] [address]  . WBC 11/13/2017 6.7  4.0 - 10.5 K/uL Final  . RBC 11/13/2017 3.81* 3.87 - 5.11 MIL/uL Final  . Hemoglobin 11/13/2017 11.5* 12.0 - 15.0 g/dL Final  . HCT 11/13/2017 34.2* 36.0 - 46.0 % Final  . MCV 11/13/2017 89.8  78.0 - 100.0 fL Final  . MCH 11/13/2017 30.2  26.0 - 34.0 pg Final  . MCHC 11/13/2017 33.6  30.0 - 36.0 g/dL Final  . RDW 11/13/2017 13.3  11.5 - 15.5 % Final  . Platelets 11/13/2017 217  150 - 400 K/uL Final  . Neutrophils Relative % 11/13/2017 47  % Final  . Neutro Abs 11/13/2017 3.2  1.7 - 7.7 K/uL Final  . Lymphocytes Relative 11/13/2017 42  % Final  . Lymphs Abs 11/13/2017 2.8  0.7 - 4.0 K/uL Final  . Monocytes Relative 11/13/2017 9  % Final  . Monocytes Absolute 11/13/2017 0.6  0.1 - 1.0 K/uL Final  . Eosinophils Relative 11/13/2017 2  % Final  . Eosinophils Absolute 11/13/2017 0.1  0.0 - 0.7 K/uL Final  . Basophils Relative 11/13/2017 0  % Final  . Basophils Absolute 11/13/2017 0.0  0.0 - 0.1 K/uL Final   Performed at [facility], [address], [place], [place] [address]  . Sodium 11/13/2017 140  135 - 145 mmol/L Final  . Potassium 11/13/2017 4.4  3.5 - 5.1 mmol/L Final  . Chloride 11/13/2017 107  101 - 111 mmol/L Final  . CO2 11/13/2017 26  22 - 32 mmol/L Final  . Glucose, Bld 11/13/2017 100* 65 - 99 mg/dL Final  . BUN 11/13/2017 21* 6 - 20 mg/dL Final  . Creatinine, Ser 11/13/2017 0.94  0.44 - 1.00 mg/dL Final  . Calcium 11/13/2017 9.6  8.9 - 10.3 mg/dL Final  . Total Protein 11/13/2017 7.1  6.5 - 8.1 g/dL Final  . Albumin  11/13/2017 3.9  3.5 - 5.0 g/dL Final  . AST 11/13/2017 26  15 - 41 U/L Final  . ALT 11/13/2017 14  14 - 54 U/L Final  . Alkaline Phosphatase 11/13/2017 50  38 - 126 U/L Final  . Total Bilirubin 11/13/2017 0.7  0.3 - 1.2 mg/dL Final  . GFR calc non Af Amer 11/13/2017 57* >60 mL/min Final  . GFR calc Af Amer 11/13/2017 >60  >60 mL/min Final   Comment: (NOTE) The eGFR has been calculated using the CKD EPI equation. This calculation has not been validated in all clinical situations. eGFR's persistently <60 mL/min signify possible Chronic Kidney Disease.   Georgiann Hahn gap 11/13/2017 7  5 - 15 Final   Performed at [facility], [address] [place], [place] [address]     Pathology Orders Placed This Encounter  Procedures  . CBC with Differential/Platelet    Standing Status:   Future    Standing Expiration Date:   12/27/2018  . Comprehensive metabolic panel    Standing Status:   Future    Standing Expiration Date:   12/27/2018  . Lactate dehydrogenase    Standing Status:   Future    Standing Expiration Date:   12/27/2018  . Ferritin    Standing Status:   Future    Standing Expiration Date:   12/27/2018       Zoila Shutter MD

## 2017-12-26 NOTE — Progress Notes (Signed)
Outpatient GI NP had reached out to RD to see about providing patient with coupons.   She has been at [facility] in the past for weight loss, but, when seen, patient was not amenable to any of RDs diet recommendations and was felt to only be seeking free ensure.   Similarly, when RD told patient today he was going to get her glucerna coupons, on return she says disappointingly "I thought you were bringing me samples".   Family does note patient has poor intake/wt loss. Pt has referral to Cardiothoracic surgery for abnormal PET scan. Can follow up after plan of care determined  Burtis Junes RD, LDN, CNSC Clinical Nutrition Available Tues-Sat via Pager: [phone] 12/26/2017 11:26 AM

## 2017-12-26 NOTE — Patient Instructions (Signed)
[facility] Discharge Instructions  Today you saw Dr. Higgs.    Thank you for choosing [facility] at [facility] to provide your oncology and hematology care.  To afford each patient quality time with our provider, please arrive at least 15 minutes before your scheduled appointment time.   If you have a lab appointment with the Cancer Center please come in thru the  Main Entrance and check in at the main information desk  You need to re-schedule your appointment should you arrive 10 or more minutes late.  We strive to give you quality time with our providers, and arriving late affects you and other patients whose appointments are after yours.  Also, if you no show three or more times for appointments you may be dismissed from the clinic at the providers discretion.     Again, thank you for choosing [facility].  Our hope is that these requests will decrease the amount of time that you wait before being seen by our physicians.       _____________________________________________________________  Should you have questions after your visit to [facility], please contact our office at [phone] between the hours of 8:30 a.m. and 4:30 p.m.  Voicemails left after 4:30 p.m. will not be returned until the following business day.  For prescription refill requests, have your pharmacy contact our office.       Resources For Cancer Patients and their Caregivers ? American Cancer Society: Can assist with transportation, wigs, general needs, runs Look Good Feel Better.        1-888-227-6333 ? Cancer Care: Provides financial assistance, online support groups, medication/co-pay assistance.  1-800-813-HOPE (4673) ? [facility] Assists [place] cancer patients and their families through emotional , educational and financial support.  336-427-4357 ? [place] DSS Where to apply for  food stamps, Medicaid and utility assistance. 336-342-1394 ? [facility]: Transportation to medical appointments. 336-347-2287 ? Social Security Administration: May apply for disability if have a Stage IV cancer. 336-342-7796 1-800-772-1213 ? [place] Aging, Disability and Transit Services: Assists with nutrition, care and transit needs. 336-349-2343  Cancer Center Support Programs:   > Cancer Support Group  2nd Tuesday of the month 1pm-2pm, Journey Room   > Creative Journey  3rd Tuesday of the month 1130am-1pm, Journey Room    

## 2018-01-03 ENCOUNTER — Encounter: Payer: Medicare HMO | Admitting: Thoracic Surgery (Cardiothoracic Vascular Surgery)

## 2018-01-04 ENCOUNTER — Institutional Professional Consult (permissible substitution) (INDEPENDENT_AMBULATORY_CARE_PROVIDER_SITE_OTHER): Payer: Medicare HMO | Admitting: Thoracic Surgery (Cardiothoracic Vascular Surgery)

## 2018-01-04 ENCOUNTER — Encounter: Payer: Self-pay | Admitting: Thoracic Surgery (Cardiothoracic Vascular Surgery)

## 2018-01-04 VITALS — BP 125/71 | HR 67 | Resp 20 | Ht 65.0 in | Wt 144.0 lb

## 2018-01-04 DIAGNOSIS — R911 Solitary pulmonary nodule: Secondary | ICD-10-CM | POA: Diagnosis not present

## 2018-01-04 NOTE — Progress Notes (Signed)
PCP is Buelah Manis, Modena Nunnery, MD Referring Provider is Higgs, Mathis Dad, MD  Chief Complaint  Patient presents with  . Lung Lesion    Surgical eval, PET Scan 12/24/17, Chest CT 12/03/17     HPI: [name] is sent for consultation regarding a right upper lobe lung nodule.  Charlotte Henry is a 77 year old woman with a history of ongoing tobacco abuse (50 pack years), asthmatic bronchitis, coronary artery disease, complete heart block with permanent pacemaker, cerebrovascular disease, peripheral arterial disease with claudication, stage III chronic kidney disease, peripheral neuropathy, hypertension, hyperlipidemia, and type 2 diabetes.  She recently has been having problems with poor appetite, weight loss, generalized weakness and fatigue.  She has been undergoing work-up for that.  She had a CT of the chest in June which showed a new 9 x 10 mm spiculated nodule in the anterior aspect of the right upper lobe.  There was no mediastinal or hilar adenopathy.  PET CT showed a 10 mm nodule with an SUV max of 5.0.  There were no other suspicious findings.  She did have aortic and coronary atherosclerosis.  She continues to smoke about 1/2 pack/day.  She has a 50-pack-year history.  She says that she can walk without shortness of breath.  Her walking is very limited due to claudication.  She is followed by Dr. Scot Dock for that.  She has not been having any chest pain, pressure, or tightness.  Her appetite is been poor for prolonged period time she has lost about 8 pounds over the past 3 months.  She eats about 1 meal a day.  She is accompanied by her son and daughter.  Her daughter does not feel that she is a candidate for surgery. Past Medical History:  Diagnosis Date  . 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 ASCVD (arteriosclerotic cardiovascular disease)    a. cath in 4/04,-80%LAD; 70% RCA, -> DESx2; residual 60% distal RCA; nl EF  b. Nuc 02/2016 aborted due to bradycardia. Will need to be rescheduled   .  Carotid artery occlusion   . Cerebrovascular disease   . CHB (complete heart block) ([place])    a. s/p PPM on 03/24/16 with a St. Jude (serial number [id number]) pacemaker  . Chronic kidney disease (CKD), stage III (moderate) ([facility])   . Clostridium difficile colitis 03/2013   a. 03/2013  . COPD (chronic obstructive pulmonary disease) ([place])   . Coronary disease   . Diabetes mellitus   . Diverticulosis   . GERD (gastroesophageal reflux disease)   . Gout   . H pylori ulcer   . Hyperlipidemia    chose to stop statin, takes Zetia  . Hypertension    not currently on medication  . IBS (irritable bowel syndrome)   . PVD (peripheral vascular disease) ([place])    a. moderate to severely decreased ABI's in 8/08, sig. aortic inflow disease b. repeat ABI's in 2011 improved- mild disease on the left and moderate to severe disease on the right  . S/P placement of cardiac pacemaker    a. 03/24/16: St. Jude (serial number [id number]) pacemaker  . Symptomatic anemia 08/15/2016  . TIA (transient ischemic attack) 2012  . Tobacco abuse    a. 50 pack years continuing at 1/2 pack per day  . Tobacco use disorder 10/15/2015    Past Surgical History:  Procedure Laterality Date  . ABDOMINAL HYSTERECTOMY    . carpel tunnel release     bilateral  . CHOLECYSTECTOMY    . COLONOSCOPY  2009  . COLONOSCOPY N/A 08/04/2016   incomplete due to prep.   . COLONOSCOPY N/A 08/05/2016   Dr. Oneida Alar: moderately redundant rectosigmoid and sigmoid, normal TI, single large-mouthed diverticulum in hepatic flexure, external hemorrhoids, path negative for microscopic colitis   . ENTEROSCOPY N/A 08/18/2016   Procedure: ENTEROSCOPY;  Surgeon: Daneil Dolin, MD;  Location: [facility];  Service: Endoscopy;  Laterality: N/A;  Pediatric colonoscope  . EP IMPLANTABLE DEVICE N/A 03/24/2016   Procedure: Pacemaker Implant;  Surgeon: [name], MD;  Location: [place] [facility];  Service: Cardiovascular;  Laterality: N/A;  .  ESOPHAGOGASTRODUODENOSCOPY N/A 08/04/2016   Dr. Oneida Alar: non-critical Schatzki's ring, small hiatal hernia, +H.pylori gastritis on path.   Marland Kitchen GIVENS CAPSULE STUDY N/A 08/16/2016   Procedure: GIVENS CAPSULE STUDY;  Surgeon: Daneil Dolin, MD;  Location: [facility];  Service: Endoscopy;  Laterality: N/A;  . TOTAL ABDOMINAL HYSTERECTOMY W/ BILATERAL SALPINGOOPHORECTOMY      Family History  Problem Relation Age of Onset  . Liver disease Mother 29  . Hypertension Mother   . Cerebral aneurysm Father   . Aneurysm Father 89  . Cancer Sister   . Liver disease Brother   . Liver disease Brother   . Liver disease Brother   . Diabetes type II Brother   . Alcohol abuse Brother   . Cancer Sister   . Colon cancer Neg Hx     Social History Social History   Tobacco Use  . Smoking status: Current Every Day Smoker    Packs/day: 1.00    Years: 50.00    Pack years: 50.00    Types: Cigarettes    Start date: 02/02/1966  . Smokeless tobacco: Never Used  . Tobacco comment: 1/2 pack daily  Substance Use Topics  . Alcohol use: No    Alcohol/week: 0.0 oz    Comment: H/O of 6 pack per day quiting in 2003  . Drug use: No    Current Outpatient Medications  Medication Sig Dispense Refill  . acetaminophen (TYLENOL) 500 MG tablet Take 500 mg by mouth every 6 (six) hours as needed for mild pain or moderate pain.    Marland Kitchen albuterol (PROVENTIL HFA;VENTOLIN HFA) 108 (90 Base) MCG/ACT inhaler Inhale 2 puffs into the lungs every 4 (four) hours as needed for wheezing or shortness of breath. 1 Inhaler 2  . ALPRAZolam (XANAX) 0.25 MG tablet Take 1 tablet (0.25 mg total) by mouth at bedtime as needed. 30 tablet 1  . calcium citrate-vitamin D (CITRACAL+D) 315-200 MG-UNIT per tablet Take 1 tablet by mouth daily.      . Cranberry 250 MG CAPS Take 250 mg by mouth daily.    . Cyanocobalamin (VITAMIN B 12 PO) Take 1 tablet by mouth daily.     Marland Kitchen esomeprazole (NEXIUM) 40 MG capsule TAKE 1 CAPSULE BY MOUTH DAILY BEFORE  BREAKFAST. 30 capsule 11  . ezetimibe (ZETIA) 10 MG tablet Take 1 tablet (10 mg total) by mouth daily. 90 tablet 3  . ferrous sulfate 325 (65 FE) MG tablet Take 325 mg by mouth daily with breakfast.    . Fluticasone-Salmeterol (ADVAIR) 100-50 MCG/DOSE AEPB Inhale 1 puff into the lungs 2 (two) times daily. (Patient taking differently: Inhale 1 puff into the lungs as needed. ) 1 each 3  . gabapentin (NEURONTIN) 100 MG capsule TAKE 1 TO 2 CAPSULES BY MOUTH AT BEDTIME FOR NERVE PAIN. 60 capsule 6  . hydrOXYzine (ATARAX/VISTARIL) 25 MG tablet 1 PO Q6H PRN FOR  ITCHING. MAY REPEAT A DOSE 1 HOUR AFTER EACH DOSE. NO MORE THAN 8 PILLS/DAY. 45 tablet 0  . lubiprostone (AMITIZA) 8 MCG capsule Take 8 mcg by mouth as needed.     . mirabegron ER (MYRBETRIQ) 25 MG TB24 tablet Take 1 tablet (25 mg total) by mouth at bedtime. For bladder (Patient taking differently: Take 25 mg by mouth as needed. For bladder) 30 tablet 6  . mirtazapine (REMERON) 15 MG tablet Take 0.5 tablets (7.5 mg total) by mouth at bedtime. For appetite 30 tablet 2  . Multiple Vitamin (MULTIVITAMIN WITH MINERALS) TABS tablet Take 1 tablet by mouth daily.    . Probiotic Product (PROBIOTIC DAILY PO) Take 1 capsule by mouth daily.     . Tiotropium Bromide Monohydrate (SPIRIVA RESPIMAT) 1.25 MCG/ACT AERS Inhale 2 puffs into the lungs daily. (Patient taking differently: Inhale 2 puffs into the lungs as needed. ) 1 Inhaler 11   No current facility-administered medications for this visit.     Allergies  Allergen Reactions  . Ace Inhibitors Hives, Shortness Of Breath and Swelling  . Aspirin Other (See Comments)    Blood in stool and nose bleed  . Codeine Swelling  . Pineapple Shortness Of Breath and Swelling  . Plasticized Base [Plastibase] Hives and Swelling    No plastics  . Ultram [Tramadol] Swelling  . Adhesive [Tape] Swelling and Rash  . Naprosyn [Naproxen] Swelling  . Biaxin [Clarithromycin] Nausea And Vomiting and Other (See Comments)     Dizziness.  . Clindamycin/Lincomycin Other (See Comments)    Burning sensation throat, abdomen  . Linzess [Linaclotide]     DIARRHEA, FECAL INCONTINENCE  . Metronidazole     NAUSEA AND WEAKNESS  . Nsaids   . Penicillins     Lip swelling, Hives Has patient had a PCN reaction causing immediate rash, facial/tongue/throat swelling, SOB or lightheadedness with hypotension:YES Has patient had a PCN reaction causing severe rash involving mucus membranes or skin necrosis: NO Has patient had a PCN reaction that required hospitalization NO Has patient had a PCN reaction occurring within the last 10 years: NO If all of the above answers are "NO", then may proceed with Cephalosporin use.  . Varenicline Tartrate Swelling  . Xarelto [Rivaroxaban]     Bleeding    Review of Systems  Constitutional: Positive for activity change, appetite change and unexpected weight change (Lost 8 pounds in 3 months).  HENT: Negative for trouble swallowing and voice change.   Eyes: Negative for visual disturbance.  Respiratory: Positive for apnea, shortness of breath and wheezing (Asthma). Negative for cough.   Cardiovascular: Positive for leg swelling (Right greater than left). Negative for chest pain.       Claudication right greater than left  Gastrointestinal: Negative for abdominal distention and abdominal pain.  Genitourinary: Negative for difficulty urinating and dysuria.  Musculoskeletal: Positive for arthralgias and gait problem.  Neurological: Positive for numbness. Negative for syncope.       Mini stroke  Hematological: Negative for adenopathy. Does not bruise/bleed easily.       Anemia  Psychiatric/Behavioral: Positive for dysphoric mood.  All other systems reviewed and are negative.   BP 125/71   Pulse 67   Resp 20   Ht 5\' 5"  (1.651 m)   Wt 144 lb (65.3 kg)   BMI 23.96 kg/m  Physical Exam  Constitutional: She is oriented to person, place, and time. She appears well-developed. No distress.   HENT:  Head: Normocephalic and atraumatic.  Eyes: Conjunctivae  are normal. No scleral icterus.  Neck: No thyromegaly present.  Cardiovascular: Normal rate and regular rhythm.  Murmur (2/6 systolic) heard. Pulmonary/Chest: Effort normal. No respiratory distress. She has no wheezes. She has rales (Both bases).  Abdominal: Soft. She exhibits no distension. There is no tenderness.  Musculoskeletal: She exhibits no edema.  Lymphadenopathy:    She has no cervical adenopathy.  Neurological: She is alert and oriented to person, place, and time. No cranial nerve deficit. Coordination normal.  Skin: Skin is warm and dry.  Psychiatric:  Flat affect, slow response  Vitals reviewed.  Diagnostic Tests: CT CHEST WITHOUT CONTRAST  TECHNIQUE: Multidetector CT imaging of the chest was performed following the standard protocol without IV contrast.  COMPARISON:  Chest radiograph dated 08/15/2016  FINDINGS: Study was not performed as a low-dose lung cancer screening examination.  Cardiovascular: The heart is normal in size. No pericardial effusion. Left subclavian pacemaker.  No evidence of thoracic aortic aneurysm. Atherosclerotic calcifications of the aortic arch.  Three vessel cord atherosclerosis.  Mediastinum/Nodes: No suspicious mediastinal lymphadenopathy.  Visualized thyroid is unremarkable.  Lungs/Pleura: Mild centrilobular and paraseptal emphysematous changes, upper lobe predominant.  11 x 9 x 10 mm irregular nodule in the medial right upper lobe (series 4/image 66).  No focal consolidation.  No pleural effusion or pneumothorax.  Upper Abdomen: Visualized upper abdomen is notable for pancreatic atrophy, vascular calcifications, and prior cholecystectomy.  Musculoskeletal: Degenerative changes of the visualized thoracolumbar spine.  IMPRESSION: 11 mm irregular nodule in the medial right upper lobe, worrisome for possible primary bronchogenic neoplasm.  Consider referral to multidisciplinary tumor board and possible PET-CT versus 3 month follow-up CT chest.  These results will be called to the ordering clinician or representative by the Radiologist Assistant, and communication documented in the PACS or zVision Dashboard.  Aortic Atherosclerosis (ICD10-I70.0) and Emphysema (ICD10-J43.9).   Electronically Signed   By: Julian Hy M.D.   On: 12/03/2017 10:44 NUCLEAR MEDICINE PET SKULL BASE TO THIGH  TECHNIQUE: 9.2 mCi F-18 FDG was injected intravenously. Full-ring PET imaging was performed from the skull base to thigh after the radiotracer. CT data was obtained and used for attenuation correction and anatomic localization.  Fasting blood glucose: 83 mg/dl  COMPARISON:  CT on 11/30/2017  FINDINGS: (Reference/background mediastinal blood pool activity: SUV max = 2.5)  NECK:  No hypermetabolic lymph nodes or masses.  Incidental CT findings:  None.  CHEST: 10 mm pulmonary nodule in the anterior and medial right upper lobe on image 96/3 shows hypermetabolic activity, with SUV max of 5.0. No other suspicious pulmonary nodules or masses are seen on CT images. No evidence of pleural effusion.  No hypermetabolic lymph nodes within the thorax.  Incidental CT findings:  Aortic and coronary artery atherosclerosis.  ABDOMEN/PELVIS: No abnormal hypermetabolic activity within the liver, pancreas, adrenal glands, or spleen. No hypermetabolic lymph nodes in the abdomen or pelvis.  Incidental CT findings: Prior cholecystectomy. No evidence of biliary obstruction. Aortic atherosclerosis and extensive visceral arterial calcification. Previous hysterectomy.  SKELETON: No focal hypermetabolic bone lesions to suggest skeletal metastasis.  Incidental CT findings:  None.  IMPRESSION: 10 mm hypermetabolic pulmonary nodule in the anterior and medial right upper lobe, highly suspicious for bronchogenic  carcinoma.  No evidence of local or distant metastatic disease.   Electronically Signed   By: Earle Gell M.D.   On: 12/25/2017 09:29  Pulmonary function testing FVC 2.2 (88%) FEV1 1.7 (87%) DLCO 10.78 (38%)  Impression: [name] is a 77 year old woman with  a past medical history of tobacco abuse, COPD, stage III chronic kidney disease, type 2 diabetes complicated by neuropathy and peripheral arterial disease, claudication, coronary artery disease, complete heart block requiring pacemaker, and anemia.  She has been feeling poorly for some time with poor appetite, weight loss, generalized weakness and fatigue.  She has a 50-pack-year history of smoking.  A recent CT showed a new 9 x 90mm spiculated nodule in the anterior aspect of the right upper lobe.  This was markedly hypermetabolic on PET CT with an SUV of 5.0.  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  Granulomatous disease is also in the differential but given her age, smoking history, the appearance of the nodule, and the high level of uptake for a small nodule this has to be considered a non-small cell carcinoma until it can be proven otherwise.  This is a difficult nodular biopsy due to its close proximity to the aorta.  This would be extremely difficult to access with navigational bronchoscopy.  I do not think interventional radiology would be anxious to biopsy it either.   She does have adequate pulmonary reserve to tolerate a resection.  However I am not sure her overall condition makes her a candidate.  She has a very depressed affect, poor appetite, weight loss, limited mobility, generalized weakness, fatigue and extensive cardiovascular disease.  I am not sure she will be able to actively participate in her recovery from surgery.  Given the small peripheral nature of the lesion it likely could be treated with good results with stereotactic radiation.  [name]'s family does not think she is a good  surgical candidate.  We discussed potential referral to radiation oncology and she and her family are in favor of that.  Plan: Refer to radiation oncology for consideration for stereotactic radiotherapy.   If for some reason she is not a candidate for stereotactic radiation we could pursue a wedge resection.  Melrose Nakayama, MD [facility] [phone]

## 2018-01-07 ENCOUNTER — Ambulatory Visit (INDEPENDENT_AMBULATORY_CARE_PROVIDER_SITE_OTHER): Payer: Medicare HMO | Admitting: *Deleted

## 2018-01-07 DIAGNOSIS — I442 Atrioventricular block, complete: Secondary | ICD-10-CM

## 2018-01-08 NOTE — Progress Notes (Signed)
Remote pacemaker transmission.   

## 2018-01-09 NOTE — Progress Notes (Signed)
Thoracic Location of Tumor / Histology: Right Upper lobe lung nodule  Patient presented with poor appetite, weight loss, generalized weakness and fatigue.    CT chest 11/30/2017: 9 x 10 mm spiculated nodule in the anterior aspect of the right upper lobe.  There was no mediastinal or hilar adenopathy.  PET 12/24/2017: 10 mm hypermetabolic pulmonary nodule in the anterior and medial right upper lobe, highly suspicious for bronchogenic carcinoma.  No evidence of local or distant metastatic disease.  Biopsies of   Tobacco/Marijuana/Snuff/ETOH use: Current Smoker  Past/Anticipated interventions by cardiothoracic surgery, if any:  Dr. Roxan Hockey 01/04/2018 -This is a difficult nodular biopsy due to its close proximity to the aorta.  This would be extremely difficult to access with navigational bronchoscopy.  I do not think interventional radiology would be anxious to biopsy it either.  -She does have adequate pulmonary reserve to tolerate a resection.  However I am not sure her overall condition makes her a candidate. -I am not sure she will be able to actively participate in her recovery from surgery.  Given the small peripheral nature of the lesion it likely could be treated with good results with stereotactic radiation. -Refer to radiation oncology for consideration for stereotactic radiotherapy.    Past/Anticipated interventions by medical oncology, if any:   Signs/Symptoms  Weight changes, if any: No  Respiratory complaints, if any: Shortness of breath with activity.  Hemoptysis, if any: No  Pain issues, if any: No chest pain, she is having some pain in her arms and legs.  BP 108/60 (BP Location: Right Arm, Patient Position: Sitting)   Pulse 60   Temp 98.7 F (37.1 C) (Oral)   Resp 18   Ht 5\' 7"  (1.702 m)   Wt 147 lb 12.8 oz (67 kg)   SpO2 100%   BMI 23.15 kg/m    Wt Readings from Last 3 Encounters:  01/10/18 147 lb 12.8 oz (67 kg)  01/10/18 147 lb 12.8 oz (67 kg)   01/04/18 144 lb (65.3 kg)    SAFETY ISSUES:  Prior radiation? No  Pacemaker/ICD? Yes, 2017  Possible current pregnancy? Total Hysterectomy  Is the patient on methotrexate? No  Current Complaints / other details:

## 2018-01-10 ENCOUNTER — Ambulatory Visit
Admission: RE | Admit: 2018-01-10 | Discharge: 2018-01-10 | Disposition: A | Discharge status: Home / Self Care | Payer: Medicare HMO | Source: Ambulatory Visit | Attending: Radiation Oncology | Admitting: Radiation Oncology

## 2018-01-10 ENCOUNTER — Other Ambulatory Visit: Payer: Self-pay

## 2018-01-10 ENCOUNTER — Encounter: Payer: Self-pay | Admitting: Radiation Oncology

## 2018-01-10 VITALS — BP 108/60 | HR 60 | Temp 98.7°F | Ht 67.0 in | Wt 147.8 lb

## 2018-01-10 VITALS — BP 108/60 | HR 60 | Temp 98.7°F | Resp 18 | Ht 67.0 in | Wt 147.8 lb

## 2018-01-10 DIAGNOSIS — C3411 Malignant neoplasm of upper lobe, right bronchus or lung: Secondary | ICD-10-CM | POA: Insufficient documentation

## 2018-01-10 DIAGNOSIS — Z886 Allergy status to analgesic agent status: Secondary | ICD-10-CM | POA: Diagnosis not present

## 2018-01-10 DIAGNOSIS — Z8673 Personal history of transient ischemic attack (TIA), and cerebral infarction without residual deficits: Secondary | ICD-10-CM | POA: Insufficient documentation

## 2018-01-10 DIAGNOSIS — N183 Chronic kidney disease, stage 3 (moderate): Secondary | ICD-10-CM | POA: Diagnosis not present

## 2018-01-10 DIAGNOSIS — R911 Solitary pulmonary nodule: Secondary | ICD-10-CM | POA: Diagnosis not present

## 2018-01-10 DIAGNOSIS — Z888 Allergy status to other drugs, medicaments and biological substances status: Secondary | ICD-10-CM | POA: Insufficient documentation

## 2018-01-10 DIAGNOSIS — Z881 Allergy status to other antibiotic agents status: Secondary | ICD-10-CM | POA: Insufficient documentation

## 2018-01-10 DIAGNOSIS — C3412 Malignant neoplasm of upper lobe, left bronchus or lung: Secondary | ICD-10-CM | POA: Insufficient documentation

## 2018-01-10 DIAGNOSIS — Z510 Encounter for antineoplastic radiation therapy: Secondary | ICD-10-CM | POA: Insufficient documentation

## 2018-01-10 DIAGNOSIS — Z880 Allergy status to penicillin: Secondary | ICD-10-CM | POA: Insufficient documentation

## 2018-01-10 DIAGNOSIS — F17210 Nicotine dependence, cigarettes, uncomplicated: Secondary | ICD-10-CM | POA: Insufficient documentation

## 2018-01-10 DIAGNOSIS — Z79899 Other long term (current) drug therapy: Secondary | ICD-10-CM | POA: Insufficient documentation

## 2018-01-10 DIAGNOSIS — I129 Hypertensive chronic kidney disease with stage 1 through stage 4 chronic kidney disease, or unspecified chronic kidney disease: Secondary | ICD-10-CM | POA: Diagnosis not present

## 2018-01-10 DIAGNOSIS — I739 Peripheral vascular disease, unspecified: Secondary | ICD-10-CM | POA: Diagnosis not present

## 2018-01-10 DIAGNOSIS — E785 Hyperlipidemia, unspecified: Secondary | ICD-10-CM | POA: Diagnosis not present

## 2018-01-10 DIAGNOSIS — Z885 Allergy status to narcotic agent status: Secondary | ICD-10-CM | POA: Diagnosis not present

## 2018-01-10 DIAGNOSIS — J449 Chronic obstructive pulmonary disease, unspecified: Secondary | ICD-10-CM | POA: Diagnosis not present

## 2018-01-10 DIAGNOSIS — C3490 Malignant neoplasm of unspecified part of unspecified bronchus or lung: Secondary | ICD-10-CM | POA: Diagnosis present

## 2018-01-10 NOTE — Progress Notes (Signed)
Radiation Oncology         [phone] ________________________________  Name: Charlotte Henry        MRN: [id number]  Date of Service: 01/10/2018 DOB: 16-Feb-1941  WL:[id number], Modena Nunnery, MD  Melrose Nakayama, *     REFERRING PHYSICIAN: Melrose Nakayama, *   DIAGNOSIS: The primary encounter diagnosis was Nodule of upper lobe of right lung. A diagnosis of Malignant neoplasm of upper lobe of right lung [facility]) was also pertinent to this visit.   HISTORY OF PRESENT ILLNESS: Charlotte Henry is a 77 y.o. female seen at the request of Dr. Roxan Hockey for a putative early stage lung cancer.  The patient has a history of iron deficiency anemia and is followed by hematology at [facility].  She was encouraged to proceed with an evaluation due to her long-standing tobacco use with lung cancer screening.  On 11/30/2017 she underwent the scan revealing an 11 mm right upper lobe nodule no evidence of any adenopathy was noted in the chest.  She underwent PET/CT on 12/24/2017 revealing the right upper lobe nodule measuring 10 mm with an SUV of 5.  No adenopathy or other evidence of disease was present.  She is met with Dr. Roxan Hockey and discussed the options of wedge resection.  She has appropriate PFTs to consider this approach however the patient and her family are concerned about the recovery process and would prefer to avoid surgery at this time.  Unfortunately the location of the lesion is close to vessels, and would not be amenable to biopsy.  She comes today to discuss options of treating putatively with stereotactic body radiotherapy.    PREVIOUS RADIATION THERAPY: No   PAST MEDICAL HISTORY:  Past Medical History:  Diagnosis Date  . Arterial fibromuscular dysplasia ([place])   . ASCVD (arteriosclerotic cardiovascular disease)    a. cath in 4/04,-80%LAD; 70% RCA, -> DESx2; residual 60% distal RCA; nl EF  b. Nuc 02/2016 aborted due to bradycardia. Will need to be rescheduled   .  Carotid artery occlusion   . Cerebrovascular disease   . CHB (complete heart block) ([place])    a. s/p PPM on 03/24/16 with a St. Jude (serial number [id number]) pacemaker  . Chronic kidney disease (CKD), stage III (moderate) ([facility])   . Clostridium difficile colitis 03/2013   a. 03/2013  . COPD (chronic obstructive pulmonary disease) ([place])   . Coronary disease   . Diverticulosis   . GERD (gastroesophageal reflux disease)   . Gout   . H pylori ulcer   . Hyperlipidemia    chose to stop statin, takes Zetia  . Hypertension    not currently on medication  . IBS (irritable bowel syndrome)   . PVD (peripheral vascular disease) ([place])    a. moderate to severely decreased ABI's in 8/08, sig. aortic inflow disease b. repeat ABI's in 2011 improved- mild disease on the left and moderate to severe disease on the right  . S/P placement of cardiac pacemaker    a. 03/24/16: St. Jude (serial number [id number]) pacemaker  . Symptomatic anemia 08/15/2016  . TIA (transient ischemic attack) 2012  . Tobacco abuse    a. 50 pack years continuing at 1/2 pack per day  . Tobacco use disorder 10/15/2015       PAST SURGICAL HISTORY: Past Surgical History:  Procedure Laterality Date  . ABDOMINAL HYSTERECTOMY    . carpel tunnel release     bilateral  . CHOLECYSTECTOMY    .  COLONOSCOPY  2009  . COLONOSCOPY N/A 08/04/2016   incomplete due to prep.   . COLONOSCOPY N/A 08/05/2016   Dr. Oneida Alar: moderately redundant rectosigmoid and sigmoid, normal TI, single large-mouthed diverticulum in hepatic flexure, external hemorrhoids, path negative for microscopic colitis   . ENTEROSCOPY N/A 08/18/2016   Procedure: ENTEROSCOPY;  Surgeon: Daneil Dolin, MD;  [facility];  Service: Endoscopy;  Laterality: N/A;  Pediatric colonoscope  . EP IMPLANTABLE DEVICE N/A 03/24/2016   Procedure: Pacemaker Implant;  Surgeon: [name], MD;  Location: [facility];  Service: Cardiovascular;  Laterality: N/A;  .  ESOPHAGOGASTRODUODENOSCOPY N/A 08/04/2016   Dr. Oneida Alar: non-critical Schatzki's ring, small hiatal hernia, +H.pylori gastritis on path.   Marland Kitchen GIVENS CAPSULE STUDY N/A 08/16/2016   Procedure: GIVENS CAPSULE STUDY;  Surgeon: Daneil Dolin, MD;  Location[facility];  Service: Endoscopy;  Laterality: N/A;  . TOTAL ABDOMINAL HYSTERECTOMY W/ BILATERAL SALPINGOOPHORECTOMY  1979     FAMILY HISTORY:  Family History  Problem Relation Age of Onset  . Liver disease Mother 8  . Hypertension Mother   . Cerebral aneurysm Father   . Aneurysm Father 105  . Cancer Sister   . Liver disease Brother   . Liver disease Brother   . Liver disease Brother   . Diabetes type II Brother   . Alcohol abuse Brother   . Colon cancer Neg Hx      SOCIAL HISTORY:  reports that she has been smoking cigarettes. She started smoking about 51 years ago. She has a 50.00 pack-year smoking history. She has never used smokeless tobacco. She reports that she does not drink alcohol or use drugs.   ALLERGIES: Ace inhibitors; Aspirin; Codeine; Pineapple; Plasticized base [plastibase]; Ultram [tramadol]; Adhesive [tape]; Naprosyn [naproxen]; Biaxin [clarithromycin]; Clindamycin/lincomycin; Linzess [linaclotide]; Metronidazole; Nsaids; Penicillins; Varenicline tartrate; and Xarelto [rivaroxaban]   MEDICATIONS:  Current Outpatient Medications  Medication Sig Dispense Refill  . acetaminophen (TYLENOL) 500 MG tablet Take 500 mg by mouth every 6 (six) hours as needed for mild pain or moderate pain.    Marland Kitchen albuterol (PROVENTIL HFA;VENTOLIN HFA) 108 (90 Base) MCG/ACT inhaler Inhale 2 puffs into the lungs every 4 (four) hours as needed for wheezing or shortness of breath. 1 Inhaler 2  . ALPRAZolam (XANAX) 0.25 MG tablet Take 1 tablet (0.25 mg total) by mouth at bedtime as needed. 30 tablet 1  . calcium citrate-vitamin D (CITRACAL+D) 315-200 MG-UNIT per tablet Take 1 tablet by mouth daily.      . Cranberry 250 MG CAPS Take 250 mg by mouth  daily.    . Cyanocobalamin (VITAMIN B 12 PO) Take 1 tablet by mouth daily.     Marland Kitchen esomeprazole (NEXIUM) 40 MG capsule TAKE 1 CAPSULE BY MOUTH DAILY BEFORE BREAKFAST. 30 capsule 11  . ezetimibe (ZETIA) 10 MG tablet Take 1 tablet (10 mg total) by mouth daily. 90 tablet 3  . ferrous sulfate 325 (65 FE) MG tablet Take 325 mg by mouth daily with breakfast.    . Fluticasone-Salmeterol (ADVAIR) 100-50 MCG/DOSE AEPB Inhale 1 puff into the lungs 2 (two) times daily. (Patient taking differently: Inhale 1 puff into the lungs as needed. ) 1 each 3  . gabapentin (NEURONTIN) 100 MG capsule TAKE 1 TO 2 CAPSULES BY MOUTH AT BEDTIME FOR NERVE PAIN. 60 capsule 6  . hydrOXYzine (ATARAX/VISTARIL) 25 MG tablet 1 PO Q6H PRN FOR ITCHING. MAY REPEAT A DOSE 1 HOUR AFTER EACH DOSE. NO MORE THAN 8 PILLS/DAY.  45 tablet 0  . lubiprostone (AMITIZA) 8 MCG capsule Take 8 mcg by mouth as needed.     . mirabegron ER (MYRBETRIQ) 25 MG TB24 tablet Take 1 tablet (25 mg total) by mouth at bedtime. For bladder (Patient taking differently: Take 25 mg by mouth as needed. For bladder) 30 tablet 6  . mirtazapine (REMERON) 15 MG tablet Take 0.5 tablets (7.5 mg total) by mouth at bedtime. For appetite 30 tablet 2  . Multiple Vitamin (MULTIVITAMIN WITH MINERALS) TABS tablet Take 1 tablet by mouth daily.    . Probiotic Product (PROBIOTIC DAILY PO) Take 1 capsule by mouth daily.     . Tiotropium Bromide Monohydrate (SPIRIVA RESPIMAT) 1.25 MCG/ACT AERS Inhale 2 puffs into the lungs daily. (Patient taking differently: Inhale 2 puffs into the lungs as needed. ) 1 Inhaler 11   No current facility-administered medications for this encounter.      REVIEW OF SYSTEMS: On review of systems, the patient reports that she is doing well overall. She does complain of fatigue but denies any chest pain, shortness of breath, cough, fevers, chills, night sweats, unintended weight changes. She denies any bowel or bladder disturbances, and denies abdominal pain,  nausea or vomiting. She denies any new musculoskeletal or joint aches or pains. A complete review of systems is obtained and is otherwise negative.     PHYSICAL EXAM:  Wt Readings from Last 3 Encounters:  01/10/18 147 lb 12.8 oz (67 kg)  01/10/18 147 lb 12.8 oz (67 kg)  01/04/18 144 lb (65.3 kg)   Temp Readings from Last 3 Encounters:  01/10/18 98.7 F (37.1 C) (Oral)  01/10/18 98.7 F (37.1 C) (Oral)  12/26/17 98.2 F (36.8 C) (Oral)   BP Readings from Last 3 Encounters:  01/10/18 108/60  01/10/18 108/60  01/04/18 125/71   Pulse Readings from Last 3 Encounters:  01/10/18 60  01/10/18 60  01/04/18 67   Pain Assessment Pain Score: 0-No pain/10  In general this is a well appearing African American female in no acute distress. She is alert and oriented x4 and appropriate throughout the examination. HEENT reveals that the patient is normocephalic, atraumatic. EOMs are intact. PERRLA. Skin is intact without any evidence of gross lesions. Cardiovascular exam reveals a regular rate and rhythm, no clicks rubs or murmurs are auscultated. Chest is clear to auscultation bilaterally. Lymphatic assessment is performed and does not reveal any adenopathy in the cervical, supraclavicular, axillary, or inguinal chains. Abdomen has active bowel sounds in all quadrants and is intact. The abdomen is soft, non tender, non distended. Lower extremities are negative for pretibial pitting edema, deep calf tenderness, cyanosis or clubbing.   ECOG = 0  0 - Asymptomatic (Fully active, able to carry on all predisease activities without restriction)  1 - Symptomatic but completely ambulatory (Restricted in physically strenuous activity but ambulatory and able to carry out work of a light or sedentary nature. For example, light housework, office work)  2 - Symptomatic, <50% in bed during the day (Ambulatory and capable of all self care but unable to carry out any work activities. Up and about more than 50%  of waking hours)  3 - Symptomatic, >50% in bed, but not bedbound (Capable of only limited self-care, confined to bed or chair 50% or more of waking hours)  4 - Bedbound (Completely disabled. Cannot carry on any self-care. Totally confined to bed or chair)  5 - Death   Eustace Pen MM, Creech RH, Tormey DC, et al. [phone]). "  Toxicity and response criteria of the [facility]". [place] Oncol. 5 (6): 649-55    LABORATORY DATA:  Lab Results  Component Value Date   WBC 6.7 11/13/2017   HGB 11.5 (L) 11/13/2017   HCT 34.2 (L) 11/13/2017   MCV 89.8 11/13/2017   PLT 217 11/13/2017   Lab Results  Component Value Date   NA 140 11/13/2017   K 4.4 11/13/2017   CL 107 11/13/2017   CO2 26 11/13/2017   Lab Results  Component Value Date   ALT 14 11/13/2017   AST 26 11/13/2017   ALKPHOS 50 11/13/2017   BILITOT 0.7 11/13/2017      RADIOGRAPHY: Nm Pet Image Initial (pi) Skull Base To Thigh  Result Date: 12/25/2017 CLINICAL DATA:  Initial treatment strategy for right upper lobe lung nodule. EXAM: NUCLEAR MEDICINE PET SKULL BASE TO THIGH TECHNIQUE: 9.2 mCi F-18 FDG was injected intravenously. Full-ring PET imaging was performed from the skull base to thigh after the radiotracer. CT data was obtained and used for attenuation correction and anatomic localization. Fasting blood glucose: 83 mg/dl COMPARISON:  CT on 11/30/2017 FINDINGS: (Reference/background mediastinal blood pool activity: SUV max = 2.5) NECK:  No hypermetabolic lymph nodes or masses. Incidental CT findings:  None. CHEST: 10 mm pulmonary nodule in the anterior and medial right upper lobe on image 96/3 shows hypermetabolic activity, with SUV max of 5.0. No other suspicious pulmonary nodules or masses are seen on CT images. No evidence of pleural effusion. No hypermetabolic lymph nodes within the thorax. Incidental CT findings:  Aortic and coronary artery atherosclerosis. ABDOMEN/PELVIS: No abnormal hypermetabolic  activity within the liver, pancreas, adrenal glands, or spleen. No hypermetabolic lymph nodes in the abdomen or pelvis. Incidental CT findings: Prior cholecystectomy. No evidence of biliary obstruction. Aortic atherosclerosis and extensive visceral arterial calcification. Previous hysterectomy. SKELETON: No focal hypermetabolic bone lesions to suggest skeletal metastasis. Incidental CT findings:  None. IMPRESSION: 10 mm hypermetabolic pulmonary nodule in the anterior and medial right upper lobe, highly suspicious for bronchogenic carcinoma. No evidence of local or distant metastatic disease. Electronically Signed   By: Earle Gell M.D.   On: 12/25/2017 09:29       IMPRESSION/PLAN: 1. 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 Dr. Lisbeth Renshaw discusses the imaging findings and reviews the nature of early stage lung cancers, and that ideally been able to prove her disease by tissue confirmation would be her preference. He did review the gold standard for treatment is still surgery, which she is not interested in. With regards to lack of tissue, the risk benefit ratio should be considered and at this time it appears that the risk of radiotherapy would be less than the risks of undergoing tissue sampling.  Dr. Lisbeth Renshaw discusses the options of proceeding directly with stereotactic body radiation (SBRT). We discussed the risks, benefits, short, and long term effects of radiotherapy, and the patient is interested in proceeding. Dr. Lisbeth Renshaw discusses the delivery and logistics of radiotherapy and anticipates a course of 3-5 fractions to the lesion.  We did outline the guidelines per NCCN for continued surveillance as well.  At the end of the conversation she would like to move forward and our staff will contact her to schedule simulation. Written consent is obtained and placed in the chart, a copy was provided to the patient.   The above documentation reflects my direct findings during this shared patient visit. Please see  the separate note by Dr. Lisbeth Renshaw on this date for  the remainder of the patient's plan of care.    Carola Rhine, PAC

## 2018-01-17 ENCOUNTER — Ambulatory Visit
Admission: RE | Admit: 2018-01-17 | Discharge: 2018-01-17 | Disposition: A | Discharge status: Home / Self Care | Payer: Medicare HMO | Source: Ambulatory Visit | Attending: Radiation Oncology | Admitting: Radiation Oncology

## 2018-01-17 DIAGNOSIS — C3411 Malignant neoplasm of upper lobe, right bronchus or lung: Secondary | ICD-10-CM | POA: Diagnosis not present

## 2018-01-17 DIAGNOSIS — C3412 Malignant neoplasm of upper lobe, left bronchus or lung: Secondary | ICD-10-CM | POA: Diagnosis not present

## 2018-01-17 DIAGNOSIS — Z510 Encounter for antineoplastic radiation therapy: Secondary | ICD-10-CM | POA: Diagnosis not present

## 2018-01-22 ENCOUNTER — Other Ambulatory Visit (HOSPITAL_COMMUNITY): Payer: Medicare HMO

## 2018-01-22 DIAGNOSIS — C3411 Malignant neoplasm of upper lobe, right bronchus or lung: Secondary | ICD-10-CM | POA: Diagnosis not present

## 2018-01-22 DIAGNOSIS — C3412 Malignant neoplasm of upper lobe, left bronchus or lung: Secondary | ICD-10-CM | POA: Diagnosis not present

## 2018-01-22 DIAGNOSIS — Z510 Encounter for antineoplastic radiation therapy: Secondary | ICD-10-CM | POA: Diagnosis not present

## 2018-01-24 ENCOUNTER — Other Ambulatory Visit: Payer: Self-pay

## 2018-01-24 DIAGNOSIS — I739 Peripheral vascular disease, unspecified: Secondary | ICD-10-CM

## 2018-01-29 ENCOUNTER — Ambulatory Visit (HOSPITAL_COMMUNITY): Payer: Medicare HMO | Admitting: Internal Medicine

## 2018-01-30 ENCOUNTER — Ambulatory Visit
Admission: RE | Admit: 2018-01-30 | Discharge: 2018-01-30 | Disposition: A | Discharge status: Home / Self Care | Payer: Medicare HMO | Source: Ambulatory Visit | Attending: Radiation Oncology | Admitting: Radiation Oncology

## 2018-01-30 DIAGNOSIS — C3412 Malignant neoplasm of upper lobe, left bronchus or lung: Secondary | ICD-10-CM | POA: Diagnosis not present

## 2018-01-30 DIAGNOSIS — Z510 Encounter for antineoplastic radiation therapy: Secondary | ICD-10-CM | POA: Diagnosis not present

## 2018-01-31 ENCOUNTER — Telehealth (HOSPITAL_COMMUNITY): Payer: Self-pay

## 2018-01-31 LAB — CUP PACEART REMOTE DEVICE CHECK
Battery Remaining Percentage: 95.5 %
Battery Voltage: 2.99 V
Brady Statistic AP VP Percent: 46 %
Brady Statistic AP VS Percent: 1 %
Brady Statistic AS VS Percent: 1 %
Brady Statistic RA Percent Paced: 45 %
Brady Statistic RV Percent Paced: 99 %
Implantable Lead Implant Date: 20171020
Implantable Lead Location: 753859
Implantable Pulse Generator Implant Date: 20171020
Lead Channel Impedance Value: 490 Ohm
Lead Channel Pacing Threshold Amplitude: 0.5 V
Lead Channel Sensing Intrinsic Amplitude: 2.1 mV
Lead Channel Setting Pacing Amplitude: 2 V
Lead Channel Setting Pacing Amplitude: 2.5 V
Lead Channel Setting Pacing Pulse Width: 0.4 ms
Lead Channel Setting Sensing Sensitivity: 3.5 mV
MDC IDC LEAD IMPLANT DT: 20171020
MDC IDC LEAD LOCATION: 753860
MDC IDC MSMT BATTERY REMAINING LONGEVITY: 110 mo
MDC IDC MSMT LEADCHNL RA IMPEDANCE VALUE: 410 Ohm
MDC IDC MSMT LEADCHNL RA PACING THRESHOLD PULSEWIDTH: 0.4 ms
MDC IDC MSMT LEADCHNL RV PACING THRESHOLD AMPLITUDE: 0.75 V
MDC IDC MSMT LEADCHNL RV PACING THRESHOLD PULSEWIDTH: 0.4 ms
MDC IDC MSMT LEADCHNL RV SENSING INTR AMPL: 7.4 mV
MDC IDC SESS DTM: 20190805101801
MDC IDC STAT BRADY AS VP PERCENT: 54 %
Pulse Gen Model: 2272
Pulse Gen Serial Number: 3180497

## 2018-01-31 NOTE — Telephone Encounter (Signed)
This encounter was created in error - please disregard.

## 2018-02-01 ENCOUNTER — Ambulatory Visit
Admission: RE | Admit: 2018-02-01 | Discharge: 2018-02-01 | Disposition: A | Discharge status: Home / Self Care | Payer: Medicare HMO | Source: Ambulatory Visit | Attending: Radiation Oncology | Admitting: Radiation Oncology

## 2018-02-01 DIAGNOSIS — C3412 Malignant neoplasm of upper lobe, left bronchus or lung: Secondary | ICD-10-CM | POA: Diagnosis not present

## 2018-02-01 DIAGNOSIS — Z510 Encounter for antineoplastic radiation therapy: Secondary | ICD-10-CM | POA: Diagnosis not present

## 2018-02-05 ENCOUNTER — Other Ambulatory Visit (HOSPITAL_COMMUNITY): Payer: Medicare HMO

## 2018-02-05 ENCOUNTER — Ambulatory Visit: Payer: Medicare HMO | Admitting: Radiation Oncology

## 2018-02-06 ENCOUNTER — Ambulatory Visit: Payer: Medicare HMO | Admitting: Vascular Surgery

## 2018-02-06 ENCOUNTER — Ambulatory Visit (HOSPITAL_COMMUNITY): Admission: RE | Admit: 2018-02-06 | Payer: Medicare HMO | Source: Ambulatory Visit

## 2018-02-06 ENCOUNTER — Ambulatory Visit
Admission: RE | Admit: 2018-02-06 | Discharge: 2018-02-06 | Disposition: A | Discharge status: Home / Self Care | Payer: Medicare HMO | Source: Ambulatory Visit | Attending: Radiation Oncology | Admitting: Radiation Oncology

## 2018-02-06 ENCOUNTER — Encounter: Payer: Self-pay | Admitting: Radiation Oncology

## 2018-02-06 DIAGNOSIS — Z510 Encounter for antineoplastic radiation therapy: Secondary | ICD-10-CM | POA: Insufficient documentation

## 2018-02-06 DIAGNOSIS — C3411 Malignant neoplasm of upper lobe, right bronchus or lung: Secondary | ICD-10-CM | POA: Insufficient documentation

## 2018-02-07 ENCOUNTER — Ambulatory Visit (HOSPITAL_COMMUNITY): Payer: Medicare HMO | Admitting: Internal Medicine

## 2018-02-12 ENCOUNTER — Inpatient Hospital Stay (HOSPITAL_COMMUNITY): Payer: Medicare HMO | Attending: Hematology

## 2018-02-12 DIAGNOSIS — Z720 Tobacco use: Secondary | ICD-10-CM | POA: Diagnosis not present

## 2018-02-12 DIAGNOSIS — I10 Essential (primary) hypertension: Secondary | ICD-10-CM | POA: Diagnosis not present

## 2018-02-12 DIAGNOSIS — D649 Anemia, unspecified: Secondary | ICD-10-CM | POA: Diagnosis not present

## 2018-02-12 DIAGNOSIS — R918 Other nonspecific abnormal finding of lung field: Secondary | ICD-10-CM | POA: Diagnosis not present

## 2018-02-12 DIAGNOSIS — D508 Other iron deficiency anemias: Secondary | ICD-10-CM

## 2018-02-12 DIAGNOSIS — Z950 Presence of cardiac pacemaker: Secondary | ICD-10-CM | POA: Insufficient documentation

## 2018-02-12 LAB — COMPREHENSIVE METABOLIC PANEL
ALBUMIN: 4.1 g/dL (ref 3.5–5.0)
ALT: 10 U/L (ref 0–44)
AST: 22 U/L (ref 15–41)
Alkaline Phosphatase: 45 U/L (ref 38–126)
Anion gap: 7 (ref 5–15)
BILIRUBIN TOTAL: 0.8 mg/dL (ref 0.3–1.2)
BUN: 23 mg/dL (ref 8–23)
CHLORIDE: 108 mmol/L (ref 98–111)
CO2: 27 mmol/L (ref 22–32)
CREATININE: 1.04 mg/dL — AB (ref 0.44–1.00)
Calcium: 9.5 mg/dL (ref 8.9–10.3)
GFR calc Af Amer: 58 mL/min — ABNORMAL LOW (ref >60)
GFR calc non Af Amer: 50 mL/min — ABNORMAL LOW (ref >60)
GLUCOSE: 109 mg/dL — AB (ref 70–99)
POTASSIUM: 4.5 mmol/L (ref 3.5–5.1)
Sodium: 142 mmol/L (ref 135–145)
Total Protein: 7.4 g/dL (ref 6.5–8.1)

## 2018-02-12 LAB — CBC WITH DIFFERENTIAL/PLATELET
BASOS ABS: 0 10*3/uL (ref 0.0–0.1)
BASOS PCT: 0 %
Eosinophils Absolute: 0.1 10*3/uL (ref 0.0–0.7)
Eosinophils Relative: 1 %
HEMATOCRIT: 33.5 % — AB (ref 36.0–46.0)
Hemoglobin: 11 g/dL — ABNORMAL LOW (ref 12.0–15.0)
Lymphocytes Relative: 33 %
Lymphs Abs: 2 10*3/uL (ref 0.7–4.0)
MCH: 30.2 pg (ref 26.0–34.0)
MCHC: 32.8 g/dL (ref 30.0–36.0)
MCV: 92 fL (ref 78.0–100.0)
MONO ABS: 0.6 10*3/uL (ref 0.1–1.0)
Monocytes Relative: 10 %
NEUTROS ABS: 3.3 10*3/uL (ref 1.7–7.7)
NEUTROS PCT: 56 %
Platelets: 201 10*3/uL (ref 150–400)
RBC: 3.64 MIL/uL — AB (ref 3.87–5.11)
RDW: 13.7 % (ref 11.5–15.5)
WBC: 6 10*3/uL (ref 4.0–10.5)

## 2018-02-12 LAB — FERRITIN: Ferritin: 411 ng/mL — ABNORMAL HIGH (ref 11–307)

## 2018-02-12 LAB — LACTATE DEHYDROGENASE: LDH: 142 U/L (ref 98–192)

## 2018-02-14 ENCOUNTER — Encounter (HOSPITAL_COMMUNITY): Payer: Self-pay | Admitting: Internal Medicine

## 2018-02-14 ENCOUNTER — Other Ambulatory Visit: Payer: Self-pay

## 2018-02-14 ENCOUNTER — Inpatient Hospital Stay (HOSPITAL_BASED_OUTPATIENT_CLINIC_OR_DEPARTMENT_OTHER): Payer: Medicare HMO | Admitting: Internal Medicine

## 2018-02-14 VITALS — BP 128/53 | HR 65 | Resp 16 | Wt 144.4 lb

## 2018-02-14 DIAGNOSIS — Z950 Presence of cardiac pacemaker: Secondary | ICD-10-CM | POA: Diagnosis not present

## 2018-02-14 DIAGNOSIS — D649 Anemia, unspecified: Secondary | ICD-10-CM | POA: Diagnosis not present

## 2018-02-14 DIAGNOSIS — R918 Other nonspecific abnormal finding of lung field: Secondary | ICD-10-CM | POA: Diagnosis not present

## 2018-02-14 DIAGNOSIS — Z720 Tobacco use: Secondary | ICD-10-CM

## 2018-02-14 DIAGNOSIS — I10 Essential (primary) hypertension: Secondary | ICD-10-CM

## 2018-02-14 DIAGNOSIS — C3411 Malignant neoplasm of upper lobe, right bronchus or lung: Secondary | ICD-10-CM

## 2018-02-14 NOTE — Progress Notes (Signed)
Diagnosis No diagnosis found.  Staging Cancer Staging No matching staging information was found for the patient.  Assessment and Plan:   1.  IDA.  Pt was previously followed by Dr. Talbert Cage.  She was last treated with IV iron 12/2016.    Labs done 02/12/2018 reviewed and showed WBC 6 HB 11 plts 201,000.  Chemistries WNL with Cr 1, K+ 4.5 normal LFTS.  Ferritin 411.  Pt will RTC in 4 weeks for follow-up to go over scans.  She will have Labs done in 4 months for follow-up.  Pt had prior SPEP done 12/2016 that was negative.   2.  RUL Lung nodule suspicious for Lung cancer.  Pt was set up for CT of chest for lung cancer screening evaluation based on > 30 yr history of smoking.  CT of chest done 11/30/2017 showed  IMPRESSION: 11 mm irregular nodule in the medial right upper lobe, worrisome for possible primary bronchogenic neoplasm. Consider referral to multidisciplinary tumor board and possible PET-CT versus 3 month follow-up CT chest.  PET scan that was done 12/24/2017 was reviewed and showed  IMPRESSION: 10 mm hypermetabolic pulmonary nodule in the anterior and medial right upper lobe, highly suspicious for bronchogenic carcinoma.  No evidence of local or distant metastatic disease.  She has been seen by Dr. Roxan Hockey of CT surgery on 01/04/2018   His review is as follows:  "This is a difficult nodular biopsy due to its close proximity to the aorta.  This would be extremely difficult to access with navigational bronchoscopy.  I do not think interventional radiology would be anxious to biopsy it either.   She does have adequate pulmonary reserve to tolerate a resection.  However I am not sure her overall condition makes her a candidate.  She has a very depressed affect, poor appetite, weight loss, limited mobility, generalized weakness, fatigue and extensive cardiovascular disease.  I am not sure she will be able to actively participate in her recovery from surgery.  Given the small peripheral  nature of the lesion it likely could be treated with good results with stereotactic radiation.  Mrs. Musich's family does not think she is a good surgical candidate.  We discussed potential referral to radiation oncology and she and her family are in favor of that."  Pt was seen by RT Dr. Lisbeth Renshaw on 01/10/2018 and his review is as follows "The patient appears to be a good candidate for stereotactic body radiation treatment for the patient's diagnosis of lung cancer.  The location of the patient's tumor makes it very difficult to biopsy which would be a high risk procedure.  Nevertheless, this appears to represent an early stage non-small cell lung cancer.  I discussed the rationale of such a treatment with the patient including the possible benefits as well as side effects and risks. All of the patient's questions were answered and the patient wishes to proceed with this treatment. I would anticipate 3-5 fractions of stereotactic body radiation treatment to the lung. Staging -stage I non-small cell lung cancer "  Pt has completed RT.  She reports she is planned for follow-up imaging in October.  Pt will RTC in October for follow-up to go over scans.  She is advised to notify the office if she has any problems prior to her next visit.  Pt will continue to be followed closely for ongoing monitoring of lung lesion.    3.  HTN.  BP is 128/53.  Follow-up with PCP.    30 minutes  spent with more than 50% spent in counseling and coordination of care.    Current Status:  Pt is seen today for follow-up.  She has completed RT.    Problem List Patient Active Problem List   Diagnosis Date Noted  . 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[place]) [C34.11] 01/10/2018  . Poor appetite [R63.0] 12/19/2017  . Itching [L29.9] 07/19/2017  . Gait instability [R26.81] 02/26/2017  . Symptomatic anemia [D64.9] 08/15/2016  . Protein-calorie malnutrition ([place]) [E46] 08/04/2016  . Helicobacter pylori gastritis [K29.70,  B96.81] 07/26/2016  . Generalized weakness [R53.1] 07/09/2016  . Normocytic anemia [D64.9] 07/03/2016  . CHB (complete heart block) ([place]) [I44.2]   . COPD (chronic obstructive pulmonary disease) ([place]) [J44.9]   . Tobacco use disorder [F17.200] 10/15/2015  . Back pain [M54.9] 12/30/2014  . OA (osteoarthritis) [M19.90] 12/30/2014  . Carotid stenosis [I65.29] 03/11/2014  . PVD (peripheral vascular disease) ([place]) [I73.9] 03/11/2014  . Colon cancer screening [Z12.11] 02/04/2014  . Overactive bladder [N32.81] 12/31/2013  . Rash and nonspecific skin eruption [R21] 05/19/2013  . Numbness and tingling-bilat foot [R20.0, R20.2] 03/05/2013  . IBS (irritable bowel syndrome) [K58.9] 12/30/2012  . Peripheral neuropathy [G62.9] 07/01/2012  . Insomnia [G47.00] 07/01/2012  . Chronic kidney disease (CKD), stage III (moderate) ([place]) [N18.3] 08/25/2010  . Cardiovascular disease [I25.10] 08/16/2009  . Cerebrovascular disease [I67.9] 08/16/2009  . Peripheral vascular disease ([place]) [I73.9] 08/16/2009  . Diabetes mellitus ([place]) [E11.9] 10/26/2008  . Hyperlipidemia [E78.5] 10/26/2008  . Gout, unspecified [M10.9] 10/26/2008  . Essential hypertension [I10] 10/26/2008  . Asthmatic bronchitis [J45.909] 10/26/2008  . Gastroesophageal reflux disease [K21.9] 10/26/2008    Past Medical History Past Medical History:  Diagnosis Date  . 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 ASCVD (arteriosclerotic cardiovascular disease)    a. cath in 4/04,-80%LAD; 70% RCA, -> DESx2; residual 60% distal RCA; nl EF  b. Nuc 02/2016 aborted due to bradycardia. Will need to be rescheduled   . Carotid artery occlusion   . Cerebrovascular disease   . CHB (complete heart block) ([place])    a. s/p PPM on 03/24/16 with a St. Jude (serial number [id number]) pacemaker  . Chronic kidney disease (CKD), stage III (moderate) ([facility])   . Clostridium difficile colitis 03/2013   a. 03/2013  . COPD (chronic obstructive pulmonary disease) ([place])   .  Coronary disease   . Diverticulosis   . GERD (gastroesophageal reflux disease)   . Gout   . H pylori ulcer   . Hyperlipidemia    chose to stop statin, takes Zetia  . Hypertension    not currently on medication  . IBS (irritable bowel syndrome)   . PVD (peripheral vascular disease) ([place])    a. moderate to severely decreased ABI's in 8/08, sig. aortic inflow disease b. repeat ABI's in 2011 improved- mild disease on the left and moderate to severe disease on the right  . S/P placement of cardiac pacemaker    a. 03/24/16: St. Jude (serial number [id number]) pacemaker  . Symptomatic anemia 08/15/2016  . TIA (transient ischemic attack) 2012  . Tobacco abuse    a. 50 pack years continuing at 1/2 pack per day  . Tobacco use disorder 10/15/2015    Past Surgical History Past Surgical History:  Procedure Laterality Date  . ABDOMINAL HYSTERECTOMY    . carpel tunnel release     bilateral  . CHOLECYSTECTOMY    . COLONOSCOPY  2009  . COLONOSCOPY N/A 08/04/2016   incomplete due to prep.   Marland Kitchen  COLONOSCOPY N/A 08/05/2016   Dr. Oneida Alar: moderately redundant rectosigmoid and sigmoid, normal TI, single large-mouthed diverticulum in hepatic flexure, external hemorrhoids, path negative for microscopic colitis   . ENTEROSCOPY N/A 08/18/2016   Procedure: ENTEROSCOPY;  Surgeon: Daneil Dolin, MD;  Location[facility];  Service: Endoscopy;  Laterality: N/A;  Pediatric colonoscope  . EP IMPLANTABLE DEVICE N/A 03/24/2016   Procedure: Pacemaker Implant;  Surgeon: [name], MD;  Location: [facility];  Service: Cardiovascular;  Laterality: N/A;  . ESOPHAGOGASTRODUODENOSCOPY N/A 08/04/2016   Dr. Oneida Alar: non-critical Schatzki's ring, small hiatal hernia, +H.pylori gastritis on path.   Marland Kitchen GIVENS CAPSULE STUDY N/A 08/16/2016   Procedure: GIVENS CAPSULE STUDY;  Surgeon: Daneil Dolin, MD;  Location: [facility];  Service: Endoscopy;  Laterality: N/A;  . TOTAL ABDOMINAL HYSTERECTOMY W/ BILATERAL  SALPINGOOPHORECTOMY  1979    Family History Family History  Problem Relation Age of Onset  . Liver disease Mother 44  . Hypertension Mother   . Cerebral aneurysm Father   . Aneurysm Father 53  . Cancer Sister   . Liver disease Brother   . Liver disease Brother   . Liver disease Brother   . Diabetes type II Brother   . Alcohol abuse Brother   . Colon cancer Neg Hx      Social History  reports that she has been smoking cigarettes. She started smoking about 52 years ago. She has a 50.00 pack-year smoking history. She has never used smokeless tobacco. She reports that she does not drink alcohol or use drugs.  Medications  Current Outpatient Medications:  .  acetaminophen (TYLENOL) 500 MG tablet, Take 500 mg by mouth every 6 (six) hours as needed for mild pain or moderate pain., Disp: , Rfl:  .  calcium citrate-vitamin D (CITRACAL+D) 315-200 MG-UNIT per tablet, Take 1 tablet by mouth daily.  , Disp: , Rfl:  .  Cranberry 250 MG CAPS, Take 250 mg by mouth daily., Disp: , Rfl:  .  Cyanocobalamin (VITAMIN B 12 PO), Take 1 tablet by mouth daily. , Disp: , Rfl:  .  esomeprazole (NEXIUM) 40 MG capsule, TAKE 1 CAPSULE BY MOUTH DAILY BEFORE BREAKFAST., Disp: 30 capsule, Rfl: 11 .  ezetimibe (ZETIA) 10 MG tablet, Take 1 tablet (10 mg total) by mouth daily., Disp: 90 tablet, Rfl: 3 .  ferrous sulfate 325 (65 FE) MG tablet, Take 325 mg by mouth daily with breakfast., Disp: , Rfl:  .  Fluticasone-Salmeterol (ADVAIR) 100-50 MCG/DOSE AEPB, Inhale 1 puff into the lungs 2 (two) times daily., Disp: 1 each, Rfl: 3 .  gabapentin (NEURONTIN) 100 MG capsule, TAKE 1 TO 2 CAPSULES BY MOUTH AT BEDTIME FOR NERVE PAIN., Disp: 60 capsule, Rfl: 6 .  mirtazapine (REMERON) 15 MG tablet, Take 0.5 tablets (7.5 mg total) by mouth at bedtime. For appetite, Disp: 30 tablet, Rfl: 2 .  Multiple Vitamin (MULTIVITAMIN WITH MINERALS) TABS tablet, Take 1 tablet by mouth daily., Disp: , Rfl:  .  Probiotic Product (PROBIOTIC  DAILY PO), Take 1 capsule by mouth daily. , Disp: , Rfl:  .  albuterol (PROVENTIL HFA;VENTOLIN HFA) 108 (90 Base) MCG/ACT inhaler, Inhale 2 puffs into the lungs every 4 (four) hours as needed for wheezing or shortness of breath. (Patient not taking: Reported on 02/14/2018), Disp: 1 Inhaler, Rfl: 2 .  ALPRAZolam (XANAX) 0.25 MG tablet, Take 1 tablet (0.25 mg total) by mouth at bedtime as needed. (Patient not taking: Reported on 02/14/2018), Disp: 30 tablet,  Rfl: 1 .  hydrOXYzine (ATARAX/VISTARIL) 25 MG tablet, 1 PO Q6H PRN FOR ITCHING. MAY REPEAT A DOSE 1 HOUR AFTER EACH DOSE. NO MORE THAN 8 PILLS/DAY. (Patient not taking: Reported on 02/14/2018), Disp: 45 tablet, Rfl: 0 .  lubiprostone (AMITIZA) 8 MCG capsule, Take 8 mcg by mouth as needed. , Disp: , Rfl:  .  mirabegron ER (MYRBETRIQ) 25 MG TB24 tablet, Take 1 tablet (25 mg total) by mouth at bedtime. For bladder (Patient not taking: Reported on 02/14/2018), Disp: 30 tablet, Rfl: 6 .  Tiotropium Bromide Monohydrate (SPIRIVA RESPIMAT) 1.25 MCG/ACT AERS, Inhale 2 puffs into the lungs daily. (Patient not taking: Reported on 02/14/2018), Disp: 1 Inhaler, Rfl: 11  Allergies Ace inhibitors; Aspirin; Codeine; Pineapple; Plasticized base [plastibase]; Ultram [tramadol]; Adhesive [tape]; Naprosyn [naproxen]; Biaxin [clarithromycin]; Clindamycin/lincomycin; Linzess [linaclotide]; Metronidazole; Nsaids; Penicillins; Varenicline tartrate; and Xarelto [rivaroxaban]  Review of Systems Review of Systems - Oncology ROS negative   Physical Exam  Vitals Wt Readings from Last 3 Encounters:  02/14/18 144 lb 6.4 oz (65.5 kg)  01/10/18 147 lb 12.8 oz (67 kg)  01/10/18 147 lb 12.8 oz (67 kg)   Temp Readings from Last 3 Encounters:  01/10/18 98.7 F (37.1 C) (Oral)  01/10/18 98.7 F (37.1 C) (Oral)  12/26/17 98.2 F (36.8 C) (Oral)   BP Readings from Last 3 Encounters:  02/14/18 (!) 128/53  01/10/18 108/60  01/10/18 108/60   Pulse Readings from Last 3  Encounters:  02/14/18 65  01/10/18 60  01/10/18 60   Constitutional: Well-developed, well-nourished, and in no distress.   HENT: Head: Normocephalic and atraumatic.  Mouth/Throat: No oropharyngeal exudate. Mucosa moist. Eyes: Pupils are equal, round, and reactive to light. Conjunctivae are normal. No scleral icterus.  Neck: Normal range of motion. Neck supple. No JVD present.  Cardiovascular: Normal rate, regular rhythm and normal heart sounds.  Exam reveals no gallop and no friction rub.   No murmur heard. Pulmonary/Chest: Effort normal and breath sounds normal. No respiratory distress. No wheezes.No rales.  Abdominal: Soft. Bowel sounds are normal. No distension. There is no tenderness. There is no guarding.  Musculoskeletal: No edema or tenderness.  Lymphadenopathy: No cervical, axillary or supraclavicular adenopathy.  Neurological: Alert and oriented to person, place, and time. No cranial nerve deficit.  Skin: Skin is warm and dry. No rash noted. No erythema. No pallor.  Psychiatric: Affect and judgment normal.   Labs Appointment on 02/12/2018  Component Date Value Ref Range Status  . WBC 02/12/2018 6.0  4.0 - 10.5 K/uL Final  . RBC 02/12/2018 3.64* 3.87 - 5.11 MIL/uL Final  . Hemoglobin 02/12/2018 11.0* 12.0 - 15.0 g/dL Final  . HCT 02/12/2018 33.5* 36.0 - 46.0 % Final  . MCV 02/12/2018 92.0  78.0 - 100.0 fL Final  . MCH 02/12/2018 30.2  26.0 - 34.0 pg Final  . MCHC 02/12/2018 32.8  30.0 - 36.0 g/dL Final  . RDW 02/12/2018 13.7  11.5 - 15.5 % Final  . Platelets 02/12/2018 201  150 - 400 K/uL Final  . Neutrophils Relative % 02/12/2018 56  % Final  . Neutro Abs 02/12/2018 3.3  1.7 - 7.7 K/uL Final  . Lymphocytes Relative 02/12/2018 33  % Final  . Lymphs Abs 02/12/2018 2.0  0.7 - 4.0 K/uL Final  . Monocytes Relative 02/12/2018 10  % Final  . Monocytes Absolute 02/12/2018 0.6  0.1 - 1.0 K/uL Final  . Eosinophils Relative 02/12/2018 1  % Final  . Eosinophils Absolute 02/12/2018  0.1  0.0 - 0.7 K/uL Final  . Basophils Relative 02/12/2018 0  % Final  . Basophils Absolute 02/12/2018 0.0  0.0 - 0.1 K/uL Final   Performed at [facility], [address], [place], [place] [address]  . Sodium 02/12/2018 142  135 - 145 mmol/L Final  . Potassium 02/12/2018 4.5  3.5 - 5.1 mmol/L Final  . Chloride 02/12/2018 108  98 - 111 mmol/L Final  . CO2 02/12/2018 27  22 - 32 mmol/L Final  . Glucose, Bld 02/12/2018 109* 70 - 99 mg/dL Final  . BUN 02/12/2018 23  8 - 23 mg/dL Final  . Creatinine, Ser 02/12/2018 1.04* 0.44 - 1.00 mg/dL Final  . Calcium 02/12/2018 9.5  8.9 - 10.3 mg/dL Final  . Total Protein 02/12/2018 7.4  6.5 - 8.1 g/dL Final  . Albumin 02/12/2018 4.1  3.5 - 5.0 g/dL Final  . AST 02/12/2018 22  15 - 41 U/L Final  . ALT 02/12/2018 10  0 - 44 U/L Final  . Alkaline Phosphatase 02/12/2018 45  38 - 126 U/L Final  . Total Bilirubin 02/12/2018 0.8  0.3 - 1.2 mg/dL Final  . GFR calc non Af Amer 02/12/2018 50* >60 mL/min Final  . GFR calc Af Amer 02/12/2018 58* >60 mL/min Final   Comment: (NOTE) The eGFR has been calculated using the CKD EPI equation. This calculation has not been validated in all clinical situations. eGFR's persistently <60 mL/min signify possible Chronic Kidney Disease.   Georgiann Hahn gap 02/12/2018 7  5 - 15 Final   Performed at [facility], [address], [place], [place] [address]  . LDH 02/12/2018 142  98 - 192 U/L Final   Performed at [facility], [address], [place], [place] [address]  . Ferritin 02/12/2018 411* 11 - 307 ng/mL Final   Performed at [facility], [address] [place], [place] [address]     Pathology No orders of the defined types were placed in this encounter.      Zoila Shutter MD

## 2018-02-19 NOTE — Progress Notes (Signed)
  Radiation Oncology         [phone]) [phone] ________________________________  Name: Charlotte Henry MRN: [id number]  Date: 02/06/2018  DOB: 11/01/1940  End of Treatment Note  Diagnosis:   77 y.o. 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 The patient received 54 Gy in 3 fractions at 18 Gy per fraction.  Beams/energy:   SBRT/SRT-VMAT // 6X-FFF Photon  Narrative: The patient tolerated radiation treatment relatively well.   The patient did not have any signs of acute toxicity during treatment.  Plan: The patient has completed radiation treatment. The patient will return to radiation oncology clinic for routine followup in one month. I advised the patient to call or return sooner if they have any questions or concerns related to their recovery or treatment.   ------------------------------------------------  Jodelle Gross, MD, PhD  This document serves as a record of services personally performed by Kyung Rudd, MD. It was created on his behalf by Rae Lips, a trained medical scribe. The creation of this record is based on the scribe's personal observations and the provider's statements to them. This document has been checked and approved by the attending provider.

## 2018-03-01 ENCOUNTER — Telehealth (HOSPITAL_COMMUNITY): Payer: Self-pay | Admitting: Dietician

## 2018-03-01 NOTE — Telephone Encounter (Signed)
Patient had left message on RD phone  asking about the ensure or glucerna the dietitian was supposed to get her.   RD had communicated in past that she could receive Ensure assistance, but she would need to be seen for a full nutritional assessment, as Ensure/Glucerna is not provided without first seeing the patient.  RD called patient and got voicemail. Left voicemail communicating above information. She is urged to call RD or [place] to schedule an appointment with the dietitian if she is interested in receiving Ensure/Glucerna. Noted RD would not be back in office until Tues.  Burtis Junes RD, LDN, CNSC Clinical Nutrition Available Tues-Sat via Pager: [phone] 03/01/2018 4:19 PM

## 2018-03-12 ENCOUNTER — Ambulatory Visit
Admission: RE | Admit: 2018-03-12 | Discharge: 2018-03-12 | Disposition: A | Discharge status: Home / Self Care | Payer: Medicare HMO | Source: Ambulatory Visit | Attending: Radiation Oncology | Admitting: Radiation Oncology

## 2018-03-12 ENCOUNTER — Encounter: Payer: Self-pay | Admitting: Radiation Oncology

## 2018-03-12 ENCOUNTER — Telehealth: Payer: Self-pay | Admitting: *Deleted

## 2018-03-12 ENCOUNTER — Other Ambulatory Visit (HOSPITAL_COMMUNITY): Payer: Medicare HMO

## 2018-03-12 ENCOUNTER — Other Ambulatory Visit: Payer: Self-pay

## 2018-03-12 ENCOUNTER — Ambulatory Visit: Payer: Medicare HMO

## 2018-03-12 VITALS — BP 138/62 | HR 60 | Temp 98.2°F | Resp 18 | Ht 67.0 in | Wt 144.0 lb

## 2018-03-12 DIAGNOSIS — C3411 Malignant neoplasm of upper lobe, right bronchus or lung: Secondary | ICD-10-CM | POA: Diagnosis not present

## 2018-03-12 DIAGNOSIS — Z888 Allergy status to other drugs, medicaments and biological substances status: Secondary | ICD-10-CM | POA: Insufficient documentation

## 2018-03-12 DIAGNOSIS — Z79899 Other long term (current) drug therapy: Secondary | ICD-10-CM | POA: Insufficient documentation

## 2018-03-12 DIAGNOSIS — Z880 Allergy status to penicillin: Secondary | ICD-10-CM | POA: Insufficient documentation

## 2018-03-12 DIAGNOSIS — Z881 Allergy status to other antibiotic agents status: Secondary | ICD-10-CM | POA: Diagnosis not present

## 2018-03-12 DIAGNOSIS — Z885 Allergy status to narcotic agent status: Secondary | ICD-10-CM | POA: Insufficient documentation

## 2018-03-12 DIAGNOSIS — Z886 Allergy status to analgesic agent status: Secondary | ICD-10-CM | POA: Insufficient documentation

## 2018-03-12 DIAGNOSIS — Z923 Personal history of irradiation: Secondary | ICD-10-CM | POA: Insufficient documentation

## 2018-03-12 NOTE — Addendum Note (Signed)
Encounter addended by: Malena Edman, RN on: 03/12/2018 2:28 PM  Actions taken: Charge Capture section accepted

## 2018-03-12 NOTE — Telephone Encounter (Signed)
Called patient to inform of CT for 03-15-18 - arrival time- 9:45 am @ [facility] Radiology, pt. to have water only - 4 hrs. prior to test, lvm for a return call

## 2018-03-12 NOTE — Progress Notes (Signed)
Radiation Oncology         [phone] ________________________________  Name: Charlotte Henry MRN: [id number]  Date of Service: 03/12/2018 DOB: [birth year]  Post Treatment Note  CC: Alycia Rossetti, MD  Alycia Rossetti, MD  Diagnosis:   Putative stage I non-small cell lung cancer of the RUL lung  Interval Since Last Radiation:  5 weeks   08/28/201-02/06/2018: The tumor in the RUL was treated with a course of stereotactic body radiation treatment. The patient received 54 Gy in 3 fractions at 18 Gy per fraction.  Narrative:  The patient returns today for routine follow-up.  She tolerated therapy well without untoward side effects of treatment.                              On review of systems, the patient states she's feeling pretty well. She denies any shortness of breath, fevers, chills, cough, or chest pain. She does have some mild clear post nasal drip most mornings. No other complaints are noted.  ALLERGIES:  is allergic to ace inhibitors; aspirin; codeine; pineapple; plasticized base [plastibase]; ultram [tramadol]; adhesive [tape]; naprosyn [naproxen]; biaxin [clarithromycin]; clindamycin/lincomycin; linzess [linaclotide]; metronidazole; nsaids; penicillins; varenicline tartrate; and xarelto [rivaroxaban].  Meds: Current Outpatient Medications  Medication Sig Dispense Refill  . acetaminophen (TYLENOL) 500 MG tablet Take 500 mg by mouth every 6 (six) hours as needed for mild pain or moderate pain.    Marland Kitchen albuterol (PROVENTIL HFA;VENTOLIN HFA) 108 (90 Base) MCG/ACT inhaler Inhale 2 puffs into the lungs every 4 (four) hours as needed for wheezing or shortness of breath. 1 Inhaler 2  . calcium citrate-vitamin D (CITRACAL+D) 315-200 MG-UNIT per tablet Take 1 tablet by mouth daily.      . Cranberry 250 MG CAPS Take 250 mg by mouth daily.    . Cyanocobalamin (VITAMIN B 12 PO) Take 1 tablet by mouth daily.     Marland Kitchen esomeprazole (NEXIUM) 40 MG capsule TAKE 1 CAPSULE BY MOUTH DAILY BEFORE  BREAKFAST. 30 capsule 11  . ezetimibe (ZETIA) 10 MG tablet Take 1 tablet (10 mg total) by mouth daily. 90 tablet 3  . ferrous sulfate 325 (65 FE) MG tablet Take 325 mg by mouth daily with breakfast.    . gabapentin (NEURONTIN) 100 MG capsule TAKE 1 TO 2 CAPSULES BY MOUTH AT BEDTIME FOR NERVE PAIN. 60 capsule 6  . lubiprostone (AMITIZA) 8 MCG capsule Take 8 mcg by mouth as needed.     . mirtazapine (REMERON) 15 MG tablet Take 0.5 tablets (7.5 mg total) by mouth at bedtime. For appetite 30 tablet 2  . Multiple Vitamin (MULTIVITAMIN WITH MINERALS) TABS tablet Take 1 tablet by mouth daily.    . Probiotic Product (PROBIOTIC DAILY PO) Take 1 capsule by mouth daily.     Marland Kitchen ALPRAZolam (XANAX) 0.25 MG tablet Take 1 tablet (0.25 mg total) by mouth at bedtime as needed. (Patient not taking: Reported on 02/14/2018) 30 tablet 1  . Fluticasone-Salmeterol (ADVAIR) 100-50 MCG/DOSE AEPB Inhale 1 puff into the lungs 2 (two) times daily. (Patient not taking: Reported on 03/12/2018) 1 each 3  . hydrOXYzine (ATARAX/VISTARIL) 25 MG tablet 1 PO Q6H PRN FOR ITCHING. MAY REPEAT A DOSE 1 HOUR AFTER EACH DOSE. NO MORE THAN 8 PILLS/DAY. (Patient not taking: Reported on 02/14/2018) 45 tablet 0  . mirabegron ER (MYRBETRIQ) 25 MG TB24 tablet Take 1 tablet (25 mg total) by mouth at bedtime. For bladder (Patient not  taking: Reported on 02/14/2018) 30 tablet 6  . Tiotropium Bromide Monohydrate (SPIRIVA RESPIMAT) 1.25 MCG/ACT AERS Inhale 2 puffs into the lungs daily. (Patient not taking: Reported on 02/14/2018) 1 Inhaler 11   No current facility-administered medications for this encounter.     Physical Findings:  height is 5\' 7"  (1.702 m) and weight is 144 lb (65.3 kg). Her oral temperature is 98.2 F (36.8 C). Her blood pressure is 138/62 and her pulse is 60. Her respiration is 18 and oxygen saturation is 95%.  Pain Assessment Pain Score: 0-No pain/10 In general this is a well appearing African American femael in no acute distress.  She's alert and oriented x4 and appropriate throughout the examination. Cardiopulmonary assessment is negative for acute distress and she exhibits normal effort with RRR, no C/R/M. Chest is clear to auscultation bilaterally.  Lab Findings: Lab Results  Component Value Date   WBC 6.0 02/12/2018   HGB 11.0 (L) 02/12/2018   HCT 33.5 (L) 02/12/2018   MCV 92.0 02/12/2018   PLT 201 02/12/2018     Radiographic Findings: No results found.  Impression/Plan: 1. 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 The patient has recovered well from her recent radiotherapy. She will need a new baseline CT scan to determine her response to therapy. We discussed that we would anticipate scarring in the location of her treated tumor and that would be a long term finding on subsequent surveillance imaging. She is interested in keeping her surveillance long term with Dr. Walden Field in [place], and she will see her next Tuesday. I have placed orders for CT imaging to be performed prior to that visit. She is in agreement and was given precautions to call if she has questions or concerns. Otherwise we will see her back as needed.      Carola Rhine, PAC

## 2018-03-15 ENCOUNTER — Ambulatory Visit (HOSPITAL_COMMUNITY)
Admission: RE | Admit: 2018-03-15 | Discharge: 2018-03-15 | Disposition: A | Discharge status: Home / Self Care | Payer: Medicare HMO | Source: Ambulatory Visit | Attending: Radiation Oncology | Admitting: Radiation Oncology

## 2018-03-15 DIAGNOSIS — C3411 Malignant neoplasm of upper lobe, right bronchus or lung: Secondary | ICD-10-CM | POA: Diagnosis not present

## 2018-03-15 DIAGNOSIS — J439 Emphysema, unspecified: Secondary | ICD-10-CM | POA: Insufficient documentation

## 2018-03-15 DIAGNOSIS — Z923 Personal history of irradiation: Secondary | ICD-10-CM | POA: Insufficient documentation

## 2018-03-15 DIAGNOSIS — C3491 Malignant neoplasm of unspecified part of right bronchus or lung: Secondary | ICD-10-CM | POA: Diagnosis not present

## 2018-03-15 DIAGNOSIS — I700 Atherosclerosis of aorta: Secondary | ICD-10-CM | POA: Diagnosis not present

## 2018-03-15 MED ORDER — IOHEXOL 300 MG/ML  SOLN
100.0000 mL | Freq: Once | INTRAMUSCULAR | Status: AC | PRN
  Administered 2018-03-15: 75 mL via INTRAVENOUS

## 2018-03-18 ENCOUNTER — Ambulatory Visit: Payer: Self-pay | Admitting: Radiation Oncology

## 2018-03-19 ENCOUNTER — Telehealth: Payer: Self-pay | Admitting: Radiation Oncology

## 2018-03-19 ENCOUNTER — Encounter (HOSPITAL_COMMUNITY): Payer: Self-pay | Admitting: Internal Medicine

## 2018-03-19 ENCOUNTER — Other Ambulatory Visit: Payer: Self-pay

## 2018-03-19 ENCOUNTER — Inpatient Hospital Stay (HOSPITAL_COMMUNITY): Payer: Medicare HMO | Attending: Hematology | Admitting: Internal Medicine

## 2018-03-19 DIAGNOSIS — I10 Essential (primary) hypertension: Secondary | ICD-10-CM | POA: Diagnosis not present

## 2018-03-19 DIAGNOSIS — R918 Other nonspecific abnormal finding of lung field: Secondary | ICD-10-CM

## 2018-03-19 DIAGNOSIS — Z720 Tobacco use: Secondary | ICD-10-CM

## 2018-03-19 DIAGNOSIS — D509 Iron deficiency anemia, unspecified: Secondary | ICD-10-CM | POA: Diagnosis not present

## 2018-03-19 DIAGNOSIS — Z79899 Other long term (current) drug therapy: Secondary | ICD-10-CM | POA: Diagnosis not present

## 2018-03-19 NOTE — Progress Notes (Signed)
Diagnosis Abnormal findings on diagnostic imaging of lung - Plan: CBC with Differential/Platelet, Comprehensive metabolic panel, Lactate dehydrogenase, CT CHEST W CONTRAST  Staging Cancer Staging No matching staging information was found for the patient.  Assessment and Plan:  1.  IDA.  Pt was previously followed by Dr. Talbert Cage.  She was last treated with IV iron 12/2016.    Labs done 02/12/2018 reviewed and showed WBC 6 HB 11 plts 201,000.  Chemistries WNL with Cr 1, K+ 4.5 normal LFTS.  Ferritin 411. Pt had prior SPEP done 12/2016 that was negative. Pt will RTC 06/2018 with repeat labs.  She is advised to notify the office if any problems prior to her next visit.     2.  RUL Lung nodule suspicious for Lung cancer.  Pt was set up for CT of chest for lung cancer screening evaluation based on > 30 yr history of smoking.  CT of chest done 11/30/2017 showed  IMPRESSION: 11 mm irregular nodule in the medial right upper lobe, worrisome for possible primary bronchogenic neoplasm. Consider referral to multidisciplinary tumor board and possible PET-CT versus 3 month follow-up CT chest.  PET scan that was done 12/24/2017 was reviewed and showed  IMPRESSION: 10 mm hypermetabolic pulmonary nodule in the anterior and medial right upper lobe, highly suspicious for bronchogenic carcinoma.  No evidence of local or distant metastatic disease.  She has been seen by Dr. Roxan Hockey of CT surgery on 01/04/2018   His review is as follows:  "This is a difficult nodular biopsy due to its close proximity to the aorta.  This would be extremely difficult to access with navigational bronchoscopy.  I do not think interventional radiology would be anxious to biopsy it either.   She does have adequate pulmonary reserve to tolerate a resection.  However I am not sure her overall condition makes her a candidate.  She has a very depressed affect, poor appetite, weight loss, limited mobility, generalized weakness, fatigue and  extensive cardiovascular disease.  I am not sure she will be able to actively participate in her recovery from surgery.  Given the small peripheral nature of the lesion it likely could be treated with good results with stereotactic radiation.  Charlotte Henry's family does not think she is a good surgical candidate.  We discussed potential referral to radiation oncology and she and her family are in favor of that."  Pt was seen by RT Dr. Lisbeth Renshaw on 01/10/2018 and his review is as follows "The patient appears to be a good candidate for stereotactic body radiation treatment for the patient's diagnosis of lung cancer.  The location of the patient's tumor makes it very difficult to biopsy which would be a high risk procedure.  Nevertheless, this appears to represent an early stage non-small cell lung cancer.  I discussed the rationale of such a treatment with the patient including the possible benefits as well as side effects and risks. All of the patient's questions were answered and the patient wishes to proceed with this treatment. I would anticipate 3-5 fractions of stereotactic body radiation treatment to the lung. Staging -stage I non-small cell lung cancer "  Pt has completed RT.  CT of chest done 03/15/2018 reviewed and showed   IMPRESSION: 6 mm irregular nodule in the medial right upper lobe, corresponding to the patient's known primary bronchogenic neoplasm, decreased status post radiation therapy.  No evidence of metastatic disease in the chest.  Pt will be set up for CT chest in 06/2018 for ongoing  follow-up.  She will continue to be monitored closely due to lung lesion.    3.  Smoking.  Smoking cessation recommended.    4.  HTN.  BP is 140/77.  Follow-up with PCP.    Current Status:  Pt is seen today for follow-up.  She is here to go over CT scans.    Problem List Patient Active Problem List   Diagnosis Date Noted  . 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[place]) [C34.11] 01/10/2018   . Poor appetite [R63.0] 12/19/2017  . Itching [L29.9] 07/19/2017  . Gait instability [R26.81] 02/26/2017  . Symptomatic anemia [D64.9] 08/15/2016  . Protein-calorie malnutrition ([place]) [E46] 08/04/2016  . Helicobacter pylori gastritis [K29.70, B96.81] 07/26/2016  . Generalized weakness [R53.1] 07/09/2016  . Normocytic anemia [D64.9] 07/03/2016  . CHB (complete heart block) ([place]) [I44.2]   . COPD (chronic obstructive pulmonary disease) ([place]) [J44.9]   . Tobacco use disorder [F17.200] 10/15/2015  . Back pain [M54.9] 12/30/2014  . OA (osteoarthritis) [M19.90] 12/30/2014  . Carotid stenosis [I65.29] 03/11/2014  . PVD (peripheral vascular disease) ([place]) [I73.9] 03/11/2014  . Colon cancer screening [Z12.11] 02/04/2014  . Overactive bladder [N32.81] 12/31/2013  . Rash and nonspecific skin eruption [R21] 05/19/2013  . Numbness and tingling-bilat foot [R20.0, R20.2] 03/05/2013  . IBS (irritable bowel syndrome) [K58.9] 12/30/2012  . Peripheral neuropathy [G62.9] 07/01/2012  . Insomnia [G47.00] 07/01/2012  . Chronic kidney disease (CKD), stage III (moderate) ([place]) [N18.3] 08/25/2010  . Cardiovascular disease [I25.10] 08/16/2009  . Cerebrovascular disease [I67.9] 08/16/2009  . Peripheral vascular disease ([place]) [I73.9] 08/16/2009  . Diabetes mellitus ([place]) [E11.9] 10/26/2008  . Hyperlipidemia [E78.5] 10/26/2008  . Gout, unspecified [M10.9] 10/26/2008  . Essential hypertension [I10] 10/26/2008  . Asthmatic bronchitis [J45.909] 10/26/2008  . Gastroesophageal reflux disease [K21.9] 10/26/2008    Past Medical History Past Medical History:  Diagnosis Date  . 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 ASCVD (arteriosclerotic cardiovascular disease)    a. cath in 4/04,-80%LAD; 70% RCA, -> DESx2; residual 60% distal RCA; nl EF  b. Nuc 02/2016 aborted due to bradycardia. Will need to be rescheduled   . Carotid artery occlusion   . Cerebrovascular disease   . CHB (complete heart block) ([place])     a. s/p PPM on 03/24/16 with a St. Jude (serial number [id number]) pacemaker  . Chronic kidney disease (CKD), stage III (moderate) ([facility])   . Clostridium difficile colitis 03/2013   a. 03/2013  . COPD (chronic obstructive pulmonary disease) ([place])   . Coronary disease   . Diverticulosis   . GERD (gastroesophageal reflux disease)   . Gout   . H pylori ulcer   . Hyperlipidemia    chose to stop statin, takes Zetia  . Hypertension    not currently on medication  . IBS (irritable bowel syndrome)   . PVD (peripheral vascular disease) ([place])    a. moderate to severely decreased ABI's in 8/08, sig. aortic inflow disease b. repeat ABI's in 2011 improved- mild disease on the left and moderate to severe disease on the right  . S/P placement of cardiac pacemaker    a. 03/24/16: St. Jude (serial number [id number]) pacemaker  . Symptomatic anemia 08/15/2016  . TIA (transient ischemic attack) 2012  . Tobacco abuse    a. 50 pack years continuing at 1/2 pack per day  . Tobacco use disorder 10/15/2015    Past Surgical History Past Surgical History:  Procedure Laterality Date  . ABDOMINAL HYSTERECTOMY    . carpel  tunnel release     bilateral  . CHOLECYSTECTOMY    . COLONOSCOPY  2009  . COLONOSCOPY N/A 08/04/2016   incomplete due to prep.   . COLONOSCOPY N/A 08/05/2016   Dr. Oneida Alar: moderately redundant rectosigmoid and sigmoid, normal TI, single large-mouthed diverticulum in hepatic flexure, external hemorrhoids, path negative for microscopic colitis   . ENTEROSCOPY N/A 08/18/2016   Procedure: ENTEROSCOPY;  Surgeon: Daneil Dolin, MD;  [facility];  Service: Endoscopy;  Laterality: N/A;  Pediatric colonoscope  . EP IMPLANTABLE DEVICE N/A 03/24/2016   Procedure: Pacemaker Implant;  Surgeon: [name], MD;  Location: [facility];  Service: Cardiovascular;  Laterality: N/A;  . ESOPHAGOGASTRODUODENOSCOPY N/A 08/04/2016   Dr. Oneida Alar: non-critical Schatzki's ring, small hiatal hernia,  +H.pylori gastritis on path.   Marland Kitchen GIVENS CAPSULE STUDY N/A 08/16/2016   Procedure: GIVENS CAPSULE STUDY;  Surgeon: Daneil Dolin, MD;  Location: [facility];  Service: Endoscopy;  Laterality: N/A;  . TOTAL ABDOMINAL HYSTERECTOMY W/ BILATERAL SALPINGOOPHORECTOMY  1979    Family History Family History  Problem Relation Age of Onset  . Liver disease Mother 22  . Hypertension Mother   . Cerebral aneurysm Father   . Aneurysm Father 64  . Cancer Sister   . Liver disease Brother   . Liver disease Brother   . Liver disease Brother   . Diabetes type II Brother   . Alcohol abuse Brother   . Colon cancer Neg Hx      Social History  reports that she has been smoking cigarettes. She started smoking about 52 years ago. She has a 50.00 pack-year smoking history. She has never used smokeless tobacco. She reports that she does not drink alcohol or use drugs.  Medications  Current Outpatient Medications:  .  acetaminophen (TYLENOL) 500 MG tablet, Take 500 mg by mouth every 6 (six) hours as needed for mild pain or moderate pain., Disp: , Rfl:  .  albuterol (PROVENTIL HFA;VENTOLIN HFA) 108 (90 Base) MCG/ACT inhaler, Inhale 2 puffs into the lungs every 4 (four) hours as needed for wheezing or shortness of breath., Disp: 1 Inhaler, Rfl: 2 .  ALPRAZolam (XANAX) 0.25 MG tablet, Take 1 tablet (0.25 mg total) by mouth at bedtime as needed. (Patient not taking: Reported on 02/14/2018), Disp: 30 tablet, Rfl: 1 .  calcium citrate-vitamin D (CITRACAL+D) 315-200 MG-UNIT per tablet, Take 1 tablet by mouth daily.  , Disp: , Rfl:  .  Cranberry 250 MG CAPS, Take 250 mg by mouth daily., Disp: , Rfl:  .  Cyanocobalamin (VITAMIN B 12 PO), Take 1 tablet by mouth daily. , Disp: , Rfl:  .  esomeprazole (NEXIUM) 40 MG capsule, TAKE 1 CAPSULE BY MOUTH DAILY BEFORE BREAKFAST., Disp: 30 capsule, Rfl: 11 .  ezetimibe (ZETIA) 10 MG tablet, Take 1 tablet (10 mg total) by mouth daily., Disp: 90 tablet, Rfl: 3 .  ferrous sulfate  325 (65 FE) MG tablet, Take 325 mg by mouth daily with breakfast., Disp: , Rfl:  .  Fluticasone-Salmeterol (ADVAIR) 100-50 MCG/DOSE AEPB, Inhale 1 puff into the lungs 2 (two) times daily. (Patient not taking: Reported on 03/12/2018), Disp: 1 each, Rfl: 3 .  gabapentin (NEURONTIN) 100 MG capsule, TAKE 1 TO 2 CAPSULES BY MOUTH AT BEDTIME FOR NERVE PAIN., Disp: 60 capsule, Rfl: 6 .  hydrOXYzine (ATARAX/VISTARIL) 25 MG tablet, 1 PO Q6H PRN FOR ITCHING. MAY REPEAT A DOSE 1 HOUR AFTER EACH DOSE. NO MORE THAN 8 PILLS/DAY. (Patient  not taking: Reported on 02/14/2018), Disp: 45 tablet, Rfl: 0 .  lubiprostone (AMITIZA) 8 MCG capsule, Take 8 mcg by mouth as needed. , Disp: , Rfl:  .  mirabegron ER (MYRBETRIQ) 25 MG TB24 tablet, Take 1 tablet (25 mg total) by mouth at bedtime. For bladder (Patient not taking: Reported on 02/14/2018), Disp: 30 tablet, Rfl: 6 .  mirtazapine (REMERON) 15 MG tablet, Take 0.5 tablets (7.5 mg total) by mouth at bedtime. For appetite, Disp: 30 tablet, Rfl: 2 .  Multiple Vitamin (MULTIVITAMIN WITH MINERALS) TABS tablet, Take 1 tablet by mouth daily., Disp: , Rfl:  .  Probiotic Product (PROBIOTIC DAILY PO), Take 1 capsule by mouth daily. , Disp: , Rfl:  .  Tiotropium Bromide Monohydrate (SPIRIVA RESPIMAT) 1.25 MCG/ACT AERS, Inhale 2 puffs into the lungs daily. (Patient not taking: Reported on 02/14/2018), Disp: 1 Inhaler, Rfl: 11  Allergies Ace inhibitors; Aspirin; Codeine; Pineapple; Plasticized base [plastibase]; Ultram [tramadol]; Adhesive [tape]; Naprosyn [naproxen]; Biaxin [clarithromycin]; Clindamycin/lincomycin; Linzess [linaclotide]; Metronidazole; Nsaids; Penicillins; Varenicline tartrate; and Xarelto [rivaroxaban]  Review of Systems Review of Systems - Oncology ROS negative other than related to co-morbidities.     Physical Exam  Vitals Wt Readings from Last 3 Encounters:  03/19/18 145 lb 9.6 oz (66 kg)  03/12/18 144 lb (65.3 kg)  02/14/18 144 lb 6.4 oz (65.5 kg)   Temp  Readings from Last 3 Encounters:  03/19/18 98.4 F (36.9 C) (Oral)  03/12/18 98.2 F (36.8 C) (Oral)  01/10/18 98.7 F (37.1 C) (Oral)   BP Readings from Last 3 Encounters:  03/19/18 131/60  03/12/18 138/62  02/14/18 (!) 128/53   Pulse Readings from Last 3 Encounters:  03/19/18 60  03/12/18 60  02/14/18 65   Constitutional: Well-developed, well-nourished, and in no distress.   HENT: Head: Normocephalic and atraumatic.  Mouth/Throat: No oropharyngeal exudate. Mucosa moist. Eyes: Pupils are equal, round, and reactive to light. Conjunctivae are normal. No scleral icterus.  Neck: Normal range of motion. Neck supple. No JVD present.  Cardiovascular: Normal rate, regular rhythm and normal heart sounds.  Exam reveals no gallop and no friction rub.   No murmur heard. Pulmonary/Chest: Effort normal and breath sounds normal. No respiratory distress. No wheezes.No rales.  Abdominal: Soft. Bowel sounds are normal. No distension. There is no tenderness. There is no guarding.  Musculoskeletal: No edema or tenderness.  Lymphadenopathy: No cervical, axillary or supraclavicular adenopathy.  Neurological: Alert and oriented to person, place, and time. No cranial nerve deficit.  Skin: Skin is warm and dry. No rash noted. No erythema. No pallor.  Psychiatric: Affect and judgment normal.   Labs No visits with results within 3 Day(s) from this visit.  Latest known visit with results is:  Appointment on 02/12/2018  Component Date Value Ref Range Status  . WBC 02/12/2018 6.0  4.0 - 10.5 K/uL Final  . RBC 02/12/2018 3.64* 3.87 - 5.11 MIL/uL Final  . Hemoglobin 02/12/2018 11.0* 12.0 - 15.0 g/dL Final  . HCT 02/12/2018 33.5* 36.0 - 46.0 % Final  . MCV 02/12/2018 92.0  78.0 - 100.0 fL Final  . MCH 02/12/2018 30.2  26.0 - 34.0 pg Final  . MCHC 02/12/2018 32.8  30.0 - 36.0 g/dL Final  . RDW 02/12/2018 13.7  11.5 - 15.5 % Final  . Platelets 02/12/2018 201  150 - 400 K/uL Final  . Neutrophils Relative  % 02/12/2018 56  % Final  . Neutro Abs 02/12/2018 3.3  1.7 - 7.7 K/uL Final  . Lymphocytes  Relative 02/12/2018 33  % Final  . Lymphs Abs 02/12/2018 2.0  0.7 - 4.0 K/uL Final  . Monocytes Relative 02/12/2018 10  % Final  . Monocytes Absolute 02/12/2018 0.6  0.1 - 1.0 K/uL Final  . Eosinophils Relative 02/12/2018 1  % Final  . Eosinophils Absolute 02/12/2018 0.1  0.0 - 0.7 K/uL Final  . Basophils Relative 02/12/2018 0  % Final  . Basophils Absolute 02/12/2018 0.0  0.0 - 0.1 K/uL Final   Performed at [facility], [address], [place], [place] [address]  . Sodium 02/12/2018 142  135 - 145 mmol/L Final  . Potassium 02/12/2018 4.5  3.5 - 5.1 mmol/L Final  . Chloride 02/12/2018 108  98 - 111 mmol/L Final  . CO2 02/12/2018 27  22 - 32 mmol/L Final  . Glucose, Bld 02/12/2018 109* 70 - 99 mg/dL Final  . BUN 02/12/2018 23  8 - 23 mg/dL Final  . Creatinine, Ser 02/12/2018 1.04* 0.44 - 1.00 mg/dL Final  . Calcium 02/12/2018 9.5  8.9 - 10.3 mg/dL Final  . Total Protein 02/12/2018 7.4  6.5 - 8.1 g/dL Final  . Albumin 02/12/2018 4.1  3.5 - 5.0 g/dL Final  . AST 02/12/2018 22  15 - 41 U/L Final  . ALT 02/12/2018 10  0 - 44 U/L Final  . Alkaline Phosphatase 02/12/2018 45  38 - 126 U/L Final  . Total Bilirubin 02/12/2018 0.8  0.3 - 1.2 mg/dL Final  . GFR calc non Af Amer 02/12/2018 50* >60 mL/min Final  . GFR calc Af Amer 02/12/2018 58* >60 mL/min Final   Comment: (NOTE) The eGFR has been calculated using the CKD EPI equation. This calculation has not been validated in all clinical situations. eGFR's persistently <60 mL/min signify possible Chronic Kidney Disease.   Georgiann Hahn gap 02/12/2018 7  5 - 15 Final   Performed at [facility], [address], [place], [place] [address]  . LDH 02/12/2018 142  98 - 192 U/L Final   Performed at [facility], [address], [place], [place] [address]  . Ferritin 02/12/2018 411* 11 - 307 ng/mL Final   Performed at [facility], [address], [place],  [place] [address]     Pathology Orders Placed This Encounter  Procedures  . CT CHEST W CONTRAST    Standing Status:   Future    Standing Expiration Date:   03/19/2019    Order Specific Question:   If indicated for the ordered procedure, I authorize the administration of contrast media per Radiology protocol    Answer:   Yes    Order Specific Question:   Preferred imaging location?    Answer:   [facility]    Order Specific Question:   Radiology Contrast Protocol - do NOT remove file path    Answer:   [email or web address]  . CBC with Differential/Platelet    Standing Status:   Future    Standing Expiration Date:   03/19/2020  . Comprehensive metabolic panel    Standing Status:   Future    Standing Expiration Date:   03/19/2020  . Lactate dehydrogenase    Standing Status:   Future    Standing Expiration Date:   03/19/2020       Zoila Shutter MD

## 2018-03-19 NOTE — Telephone Encounter (Signed)
I called pt to let her know results of CT from last week. She will also see Dr. Walden Field today and make further follow up plans for surveillance scans and visits.

## 2018-04-03 ENCOUNTER — Ambulatory Visit: Payer: Medicare HMO | Admitting: Family Medicine

## 2018-04-08 ENCOUNTER — Ambulatory Visit (INDEPENDENT_AMBULATORY_CARE_PROVIDER_SITE_OTHER): Payer: Medicare HMO | Admitting: Family Medicine

## 2018-04-08 ENCOUNTER — Ambulatory Visit (INDEPENDENT_AMBULATORY_CARE_PROVIDER_SITE_OTHER): Payer: Medicare HMO | Admitting: *Deleted

## 2018-04-08 ENCOUNTER — Encounter: Payer: Self-pay | Admitting: Family Medicine

## 2018-04-08 VITALS — BP 110/64 | HR 68 | Temp 98.0°F | Resp 14 | Ht 67.0 in | Wt 145.0 lb

## 2018-04-08 DIAGNOSIS — I442 Atrioventricular block, complete: Secondary | ICD-10-CM

## 2018-04-08 DIAGNOSIS — R531 Weakness: Secondary | ICD-10-CM

## 2018-04-08 DIAGNOSIS — E46 Unspecified protein-calorie malnutrition: Secondary | ICD-10-CM

## 2018-04-08 DIAGNOSIS — E1159 Type 2 diabetes mellitus with other circulatory complications: Secondary | ICD-10-CM

## 2018-04-08 DIAGNOSIS — R918 Other nonspecific abnormal finding of lung field: Secondary | ICD-10-CM | POA: Diagnosis not present

## 2018-04-08 MED ORDER — ESCITALOPRAM OXALATE 10 MG PO TABS: 10.0000 mg | ORAL_TABLET | Freq: Every day | ORAL | 5 refills | Status: DC

## 2018-04-08 NOTE — Progress Notes (Signed)
Remote pacemaker transmission.   

## 2018-04-08 NOTE — Progress Notes (Signed)
Subjective:    Patient ID: Charlotte Henry, female    DOB: 1940/12/30, 77 y.o.   MRN: [id number]  HPI Wt Readings from Last 3 Encounters:  04/08/18 145 lb (65.8 kg)  03/19/18 145 lb 9.6 oz (66 kg)  03/12/18 144 lb (65.3 kg)   She has lost 1 pound since her last visit in July.  Was diagnosed with a right upper lung mass that was found to be hypermetabolic on PET scan and thought to represent possible lung cancer.  However she was considered a poor surgical candidate and therefore underwent stereotactic radiation treatment to the affected area.  Repeat CT scan showed improvement from 11 mm to 6 mm.  Per hematology oncology's note, she is scheduled for repeat CT scan in July 2020 to monitor this area.  Past medical history significant for protein calorie malnutrition, weight loss, poor appetite, iron deficiency anemia currently followed by hematology as well as diabetes mellitus type 2.  She is here today for follow-up.  I am seeing her on behalf of my partner who is out on maternity leave  Patient today has an extremely flat affect.  She is slow spine to questions.  She demonstrates psychomotor retardation.  She reports having no energy.  She has no desire to eat.  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  She reports early satiety.  She has never increased her dose of Ensure as recommended by GI at the last appointment.  When I asked her about if she desires to eat, she states that she could care less.  When I asked her if she is depressed, she tells me a story of her son who recently bought her a car but she states that she could "take it or leave it".  When I ask if she is sad, she states that she has 2 grandkids but she can "take it or leave it".  Her sister recently died.  She is also battling lung cancer.  Therefore she demonstrates poor energy, poor appetite, anhedonia, general malaise, psychomotor retardation.  Therefore I believe she is demonstrating symptoms of  depression.  Other possibilities on the differential diagnosis include gastroparesis as well as mesenteric ischemia secondary to her underlying history of smoking and also documented history of peripheral vascular disease Past Medical History:  Diagnosis Date  . 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 ASCVD (arteriosclerotic cardiovascular disease)    a. cath in 4/04,-80%LAD; 70% RCA, -> DESx2; residual 60% distal RCA; nl EF  b. Nuc 02/2016 aborted due to bradycardia. Will need to be rescheduled   . Carotid artery occlusion   . Cerebrovascular disease   . CHB (complete heart block) ([place])    a. s/p PPM on 03/24/16 with a St. Jude (serial number [id number]) pacemaker  . Chronic kidney disease (CKD), stage III (moderate) (HCC)   . Clostridium difficile colitis 03/2013   a. 03/2013  . COPD (chronic obstructive pulmonary disease) ([place])   . Coronary disease   . Diverticulosis   . GERD (gastroesophageal reflux disease)   . Gout   . H pylori ulcer   . Hyperlipidemia    chose to stop statin, takes Zetia  . Hypertension    not currently on medication  . IBS (irritable bowel syndrome)   . PVD (peripheral vascular disease) ([place])    a. moderate to severely decreased ABI's in 8/08, sig. aortic inflow disease b. repeat ABI's in 2011 improved- mild disease on the left  and moderate to severe disease on the right  . S/P placement of cardiac pacemaker    a. 03/24/16: St. Jude (serial number [id number]) pacemaker  . Symptomatic anemia 08/15/2016  . TIA (transient ischemic attack) 2012  . Tobacco abuse    a. 50 pack years continuing at 1/2 pack per day  . Tobacco use disorder 10/15/2015   Past Surgical History:  Procedure Laterality Date  . ABDOMINAL HYSTERECTOMY    . carpel tunnel release     bilateral  . CHOLECYSTECTOMY    . COLONOSCOPY  2009  . COLONOSCOPY N/A 08/04/2016   incomplete due to prep.   . COLONOSCOPY N/A 08/05/2016   Dr. Oneida Alar: moderately redundant rectosigmoid and sigmoid, normal TI,  single large-mouthed diverticulum in hepatic flexure, external hemorrhoids, path negative for microscopic colitis   . ENTEROSCOPY N/A 08/18/2016   Procedure: ENTEROSCOPY;  Surgeon: Daneil Dolin, MD;  Location: [facility];  Service: Endoscopy;  Laterality: N/A;  Pediatric colonoscope  . EP IMPLANTABLE DEVICE N/A 03/24/2016   Procedure: Pacemaker Implant;  Surgeon: [name], MD;  Location: [facility];  Service: Cardiovascular;  Laterality: N/A;  . ESOPHAGOGASTRODUODENOSCOPY N/A 08/04/2016   Dr. Oneida Alar: non-critical Schatzki's ring, small hiatal hernia, +H.pylori gastritis on path.   Marland Kitchen GIVENS CAPSULE STUDY N/A 08/16/2016   Procedure: GIVENS CAPSULE STUDY;  Surgeon: Daneil Dolin, MD;  Location: [facility];  Service: Endoscopy;  Laterality: N/A;  . TOTAL ABDOMINAL HYSTERECTOMY W/ BILATERAL SALPINGOOPHORECTOMY  1979   Current Outpatient Medications on File Prior to Visit  Medication Sig Dispense Refill  . acetaminophen (TYLENOL) 500 MG tablet Take 500 mg by mouth every 6 (six) hours as needed for mild pain or moderate pain.    Marland Kitchen albuterol (PROVENTIL HFA;VENTOLIN HFA) 108 (90 Base) MCG/ACT inhaler Inhale 2 puffs into the lungs every 4 (four) hours as needed for wheezing or shortness of breath. 1 Inhaler 2  . ALPRAZolam (XANAX) 0.25 MG tablet Take 1 tablet (0.25 mg total) by mouth at bedtime as needed. 30 tablet 1  . calcium citrate-vitamin D (CITRACAL+D) 315-200 MG-UNIT per tablet Take 1 tablet by mouth daily.      . Cranberry 250 MG CAPS Take 250 mg by mouth daily.    . Cyanocobalamin (VITAMIN B 12 PO) Take 1 tablet by mouth daily.     Marland Kitchen esomeprazole (NEXIUM) 40 MG capsule TAKE 1 CAPSULE BY MOUTH DAILY BEFORE BREAKFAST. 30 capsule 11  . ezetimibe (ZETIA) 10 MG tablet Take 1 tablet (10 mg total) by mouth daily. 90 tablet 3  . ferrous sulfate 325 (65 FE) MG tablet Take 325 mg by mouth daily with breakfast.    . Fluticasone-Salmeterol (ADVAIR) 100-50 MCG/DOSE AEPB Inhale 1 puff into  the lungs 2 (two) times daily. 1 each 3  . gabapentin (NEURONTIN) 100 MG capsule TAKE 1 TO 2 CAPSULES BY MOUTH AT BEDTIME FOR NERVE PAIN. 60 capsule 6  . hydrOXYzine (ATARAX/VISTARIL) 25 MG tablet 1 PO Q6H PRN FOR ITCHING. MAY REPEAT A DOSE 1 HOUR AFTER EACH DOSE. NO MORE THAN 8 PILLS/DAY. 45 tablet 0  . lubiprostone (AMITIZA) 8 MCG capsule Take 8 mcg by mouth as needed.     . mirabegron ER (MYRBETRIQ) 25 MG TB24 tablet Take 1 tablet (25 mg total) by mouth at bedtime. For bladder 30 tablet 6  . mirtazapine (REMERON) 15 MG tablet Take 0.5 tablets (7.5 mg total) by mouth at bedtime. For appetite 30 tablet 2  . Multiple Vitamin (MULTIVITAMIN  WITH MINERALS) TABS tablet Take 1 tablet by mouth daily.    . Probiotic Product (PROBIOTIC DAILY PO) Take 1 capsule by mouth daily.     . Tiotropium Bromide Monohydrate (SPIRIVA RESPIMAT) 1.25 MCG/ACT AERS Inhale 2 puffs into the lungs daily. 1 Inhaler 11   No current facility-administered medications on file prior to visit.    Allergies  Allergen Reactions  . Ace Inhibitors Hives, Shortness Of Breath and Swelling  . Aspirin Other (See Comments)    Blood in stool and nose bleed  . Codeine Swelling  . Pineapple Shortness Of Breath and Swelling  . Plasticized Base [Plastibase] Hives and Swelling    No plastics  . Ultram [Tramadol] Swelling  . Adhesive [Tape] Swelling and Rash  . Naprosyn [Naproxen] Swelling  . Biaxin [Clarithromycin] Nausea And Vomiting and Other (See Comments)    Dizziness.  . Clindamycin/Lincomycin Other (See Comments)    Burning sensation throat, abdomen  . Linzess [Linaclotide]     DIARRHEA, FECAL INCONTINENCE  . Metronidazole     NAUSEA AND WEAKNESS  . Nsaids   . Penicillins     Lip swelling, Hives Has patient had a PCN reaction causing immediate rash, facial/tongue/throat swelling, SOB or lightheadedness with hypotension:YES Has patient had a PCN reaction causing severe rash involving mucus membranes or skin necrosis: NO Has  patient had a PCN reaction that required hospitalization NO Has patient had a PCN reaction occurring within the last 10 years: NO If all of the above answers are "NO", then may proceed with Cephalosporin use.  . Varenicline Tartrate Swelling  . Xarelto [Rivaroxaban]     Bleeding   Social History   Socioeconomic History  . Marital status: Widowed    Spouse name: Not on file  . Number of children: Not on file  . Years of education: Not on file  . Highest education level: Not on file  Occupational History  . Not on file  Social Needs  . Financial resource strain: Not on file  . Food insecurity:    Worry: Not on file    Inability: Not on file  . Transportation needs:    Medical: Not on file    Non-medical: Not on file  Tobacco Use  . Smoking status: Current Every Day Smoker    Packs/day: 1.00    Years: 50.00    Pack years: 50.00    Types: Cigarettes    Start date: 02/02/1966  . Smokeless tobacco: Never Used  . Tobacco comment: 1/2 pack daily  Substance and Sexual Activity  . Alcohol use: No    Alcohol/week: 0.0 standard drinks    Comment: H/O of 6 pack per day quiting in 2003  . Drug use: No  . Sexual activity: Not Currently  Lifestyle  . Physical activity:    Days per week: Not on file    Minutes per session: Not on file  . Stress: Not on file  Relationships  . Social connections:    Talks on phone: Not on file    Gets together: Not on file    Attends religious service: Not on file    Active member of club or organization: Not on file    Attends meetings of clubs or organizations: Not on file    Relationship status: Not on file  . Intimate partner violence:    Fear of current or ex partner: No    Emotionally abused: No    Physically abused: Not on file    Forced sexual  activity: No  Other Topics Concern  . Not on file  Social History Narrative  . Not on file    Review of Systems  All other systems reviewed and are negative.      Objective:   Physical  Exam  Constitutional: She appears well-developed and well-nourished.  Eyes: Conjunctivae are normal.  Neck: Neck supple. No thyromegaly present.  Cardiovascular: Normal rate, regular rhythm and normal heart sounds.  Pulmonary/Chest: Effort normal. She has wheezes.  Abdominal: Soft. She exhibits no distension and no mass. There is no tenderness. There is no rebound and no guarding. No hernia.  Lymphadenopathy:    She has no cervical adenopathy.  Vitals reviewed.         Assessment & Plan:  Type 2 diabetes mellitus with other circulatory complication, without long-term current use of insulin (HCC)  Protein-calorie malnutrition, unspecified severity (HCC)  Generalized weakness  Mass of upper lobe of right lung  I believe the patient's fatigue is multifactorial.  However today I am struck by her flat affect.  When she mentions her son buying her a car and her grandkids in both instances mentioned she could take it or leave it, it really concerns me for anhedonia and depression.  She has no energy.  She demonstrates psychomotor retardation.  She has poor appetite and is not eating.  She has no desire to eat.  Given her recent diagnosis and treatment for lung cancer and the death of her sister, I am concerned that some of her fatigue and weight loss and protein calorie malnutrition may be secondary to depression.  Therefore we had a long discussion today and she is willing to try switching Remeron to Lexapro 10 mg a day and then rechecking with her PCP in 4 weeks.  She is not fasting today and therefore I have deferred lab work regarding her diabetes until she sees her primary care physician.  Potential causes of early satiety or gastroparesis, mesenteric ischemia.  Defer work-up for this until after we have tried treating depression

## 2018-04-11 ENCOUNTER — Encounter: Payer: Self-pay | Admitting: Cardiology

## 2018-04-17 NOTE — Progress Notes (Signed)
[facility] Simulation and Treatment Planning Note   Name:  KEIRA BOHLIN MRN: [id number]   Date: 04/17/2018  DOB: 1941-03-01  Status:outpatient    DIAGNOSIS:    ICD-10-CM   1. 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[place]) C34.11      CONSENT VERIFIED:yes   SET UP: Patient is setup supine   IMMOBILIZATION: The patient was immobilized using a customized Vac Loc bag/ blue bag and customized accuform device   NARRATIVE:The patient was brought to the [facility].  Identity was confirmed.  All relevant records and images related to the planned course of therapy were reviewed.  Then, the patient was positioned in a stable reproducible clinical set-up for radiation therapy. Abdominal compression was applied by me.  4D CT images were obtained and reproducible breathing pattern was confirmed. Free breathing CT images were obtained.  Skin markings were placed.  The CT images were loaded into the planning software where the target and avoidance structures were contoured.  The radiation prescription was entered and confirmed.    TREATMENT PLANNING NOTE:  Treatment planning then occurred. I have requested : IMRT planning.This treatment technique is medically necessary due to the high-dose of radiation delivered to the target region which is in close proximity to adjacent critical normal structures.  3 dimensional simulation is performed and dose volume histogram of the gross tumor volume, planning tumor volume and criticial normal structures including the spinal cord and lungs were analyzed and requested.  Special treatment procedure was performed due to high dose per fraction.  The patient will be monitored for increased risk of toxicity.  Daily imaging using cone beam CT will be used for target localization.  I anticipate that the patient will receive 54 Gy in 3 fractions to target volume. Further adjustments will be made based on  the planning process is necessary.  ------------------------------------------------  Jodelle Gross, MD, PhD

## 2018-05-09 ENCOUNTER — Encounter: Payer: Medicare HMO | Admitting: Internal Medicine

## 2018-05-14 ENCOUNTER — Other Ambulatory Visit: Payer: Self-pay | Admitting: *Deleted

## 2018-05-14 MED ORDER — HYDROXYZINE HCL 25 MG PO TABS: ORAL_TABLET | ORAL | 0 refills | Status: DC

## 2018-05-14 NOTE — Telephone Encounter (Signed)
Received call from patient.   Reports that she has generalized itching and would like refill on Atarax. Medication last filled on 07/13/2017 by Barney Drain.   Ok to refill?

## 2018-05-17 ENCOUNTER — Emergency Department (HOSPITAL_COMMUNITY)
Admission: EM | Admit: 2018-05-17 | Discharge: 2018-05-17 | Disposition: A | Discharge status: Home / Self Care | Payer: Medicare HMO | Attending: Emergency Medicine | Admitting: Emergency Medicine

## 2018-05-17 ENCOUNTER — Emergency Department (HOSPITAL_COMMUNITY): Payer: Medicare HMO

## 2018-05-17 ENCOUNTER — Encounter (HOSPITAL_COMMUNITY): Payer: Self-pay | Admitting: Emergency Medicine

## 2018-05-17 ENCOUNTER — Other Ambulatory Visit: Payer: Self-pay

## 2018-05-17 DIAGNOSIS — Z8673 Personal history of transient ischemic attack (TIA), and cerebral infarction without residual deficits: Secondary | ICD-10-CM | POA: Diagnosis not present

## 2018-05-17 DIAGNOSIS — M25561 Pain in right knee: Secondary | ICD-10-CM | POA: Diagnosis not present

## 2018-05-17 DIAGNOSIS — I129 Hypertensive chronic kidney disease with stage 1 through stage 4 chronic kidney disease, or unspecified chronic kidney disease: Secondary | ICD-10-CM | POA: Insufficient documentation

## 2018-05-17 DIAGNOSIS — M25559 Pain in unspecified hip: Secondary | ICD-10-CM | POA: Diagnosis not present

## 2018-05-17 DIAGNOSIS — R1030 Lower abdominal pain, unspecified: Secondary | ICD-10-CM | POA: Diagnosis not present

## 2018-05-17 DIAGNOSIS — F17210 Nicotine dependence, cigarettes, uncomplicated: Secondary | ICD-10-CM | POA: Insufficient documentation

## 2018-05-17 DIAGNOSIS — N183 Chronic kidney disease, stage 3 (moderate): Secondary | ICD-10-CM | POA: Insufficient documentation

## 2018-05-17 DIAGNOSIS — M25551 Pain in right hip: Secondary | ICD-10-CM | POA: Insufficient documentation

## 2018-05-17 DIAGNOSIS — Z79899 Other long term (current) drug therapy: Secondary | ICD-10-CM | POA: Insufficient documentation

## 2018-05-17 DIAGNOSIS — R52 Pain, unspecified: Secondary | ICD-10-CM

## 2018-05-17 DIAGNOSIS — J449 Chronic obstructive pulmonary disease, unspecified: Secondary | ICD-10-CM | POA: Diagnosis not present

## 2018-05-17 MED ORDER — HYDROCODONE-ACETAMINOPHEN 5-325 MG PO TABS: ORAL_TABLET | ORAL | 0 refills | Status: DC

## 2018-05-17 MED ORDER — HYDROCODONE-ACETAMINOPHEN 5-325 MG PO TABS
1.0000 | ORAL_TABLET | Freq: Once | ORAL | Status: AC
  Administered 2018-05-17: 1 via ORAL
  Filled 2018-05-17: qty 1

## 2018-05-17 NOTE — Discharge Instructions (Signed)
Take the prescription as directed. Walk with your walker until you are seen in follow up.  Apply moist heat or ice to the area(s) of discomfort, for 15 minutes at a time, several times per day for the next few days.  Do not fall asleep on a heating or ice pack.  Call your regular medical doctor on Monday to schedule a follow up appointment in the next 3 days. Call the Orthopedic doctor on Monday to schedule a follow up appointment within the next week.  Return to the Emergency Department immediately if worsening.

## 2018-05-17 NOTE — ED Provider Notes (Signed)
[facility] EMERGENCY DEPARTMENT Provider Note   CSN: [id number] Arrival date & time: 05/17/18  1704     History   Chief Complaint Chief Complaint  Patient presents with  . Leg Pain    HPI Charlotte Henry is a 77 y.o. female.  HPI  Pt was seen at 1720. Per pt, c/o gradual onset and persistence of constant right hip "pains" for the past 2 weeks. Pt has been taking tylenol without improvement. Pain worsens with palpation of the area and weight bearing. Pt denies back or flank pain, no falls, no injury, no rash, no focal motor weakness, no tingling/numbness in extremities, no abd pain, no dysuria/hematuria.   Past Medical History:  Diagnosis Date  . 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 ASCVD (arteriosclerotic cardiovascular disease)    a. cath in 4/04,-80%LAD; 70% RCA, -> DESx2; residual 60% distal RCA; nl EF  b. Nuc 02/2016 aborted due to bradycardia. Will need to be rescheduled   . Carotid artery occlusion   . Cerebrovascular disease   . CHB (complete heart block) ([place])    a. s/p PPM on 03/24/16 with a St. Jude (serial number [id number]) pacemaker  . Chronic kidney disease (CKD), stage III (moderate) ([facility])   . Clostridium difficile colitis 03/2013   a. 03/2013  . COPD (chronic obstructive pulmonary disease) ([place])   . Coronary disease   . Diverticulosis   . GERD (gastroesophageal reflux disease)   . Gout   . H pylori ulcer   . Hyperlipidemia    chose to stop statin, takes Zetia  . Hypertension    not currently on medication  . IBS (irritable bowel syndrome)   . PVD (peripheral vascular disease) ([place])    a. moderate to severely decreased ABI's in 8/08, sig. aortic inflow disease b. repeat ABI's in 2011 improved- mild disease on the left and moderate to severe disease on the right  . S/P placement of cardiac pacemaker    a. 03/24/16: St. Jude (serial number [id number]) pacemaker  . Symptomatic anemia 08/15/2016  . TIA (transient ischemic attack) 2012  . Tobacco abuse      a. 50 pack years continuing at 1/2 pack per day  . Tobacco use disorder 10/15/2015    Patient Active Problem List   Diagnosis Date Noted  . 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[place]) 01/10/2018  . Poor appetite 12/19/2017  . Itching 07/19/2017  . Gait instability 02/26/2017  . Symptomatic anemia 08/15/2016  . Protein-calorie malnutrition ([place]) 08/04/2016  . Helicobacter pylori gastritis 07/26/2016  . Generalized weakness 07/09/2016  . Normocytic anemia 07/03/2016  . CHB (complete heart block) ([place])   . COPD (chronic obstructive pulmonary disease) ([place])   . Tobacco use disorder 10/15/2015  . Back pain 12/30/2014  . OA (osteoarthritis) 12/30/2014  . Carotid stenosis 03/11/2014  . PVD (peripheral vascular disease) ([place]) 03/11/2014  . Colon cancer screening 02/04/2014  . Overactive bladder 12/31/2013  . Rash and nonspecific skin eruption 05/19/2013  . Numbness and tingling-bilat foot 03/05/2013  . IBS (irritable bowel syndrome) 12/30/2012  . Peripheral neuropathy 07/01/2012  . Insomnia 07/01/2012  . Chronic kidney disease (CKD), stage III (moderate) ([place]) 08/25/2010  . Cardiovascular disease 08/16/2009  . Cerebrovascular disease 08/16/2009  . Peripheral vascular disease ([place]) 08/16/2009  . Diabetes mellitus ([place]) 10/26/2008  . Hyperlipidemia 10/26/2008  . Gout, unspecified 10/26/2008  . Essential hypertension 10/26/2008  . Asthmatic bronchitis 10/26/2008  . Gastroesophageal reflux disease 10/26/2008    Past Surgical History:  Procedure Laterality Date  . ABDOMINAL HYSTERECTOMY    . carpel tunnel release     bilateral  . CHOLECYSTECTOMY    . COLONOSCOPY  2009  . COLONOSCOPY N/A 08/04/2016   incomplete due to prep.   . COLONOSCOPY N/A 08/05/2016   Dr. Oneida Alar: moderately redundant rectosigmoid and sigmoid, normal TI, single large-mouthed diverticulum in hepatic flexure, external hemorrhoids, path negative for microscopic colitis   . ENTEROSCOPY N/A 08/18/2016    Procedure: ENTEROSCOPY;  Surgeon: Daneil Dolin, MD;  Location: [facility] SUITE;  Service: Endoscopy;  Laterality: N/A;  Pediatric colonoscope  . EP IMPLANTABLE DEVICE N/A 03/24/2016   Procedure: Pacemaker Implant;  Surgeon: [name], MD;  Location: [facility];  Service: Cardiovascular;  Laterality: N/A;  . ESOPHAGOGASTRODUODENOSCOPY N/A 08/04/2016   Dr. Oneida Alar: non-critical Schatzki's ring, small hiatal hernia, +H.pylori gastritis on path.   Marland Kitchen GIVENS CAPSULE STUDY N/A 08/16/2016   Procedure: GIVENS CAPSULE STUDY;  Surgeon: Daneil Dolin, MD;  Location: [facility] SU[facility];  Service: Endoscopy;  Laterality: N/A;  . TOTAL ABDOMINAL HYSTERECTOMY W/ BILATERAL SALPINGOOPHORECTOMY  1979     OB History    Gravida  2   Para  2   Term  2   Preterm      AB      Living        SAB      TAB      Ectopic      Multiple      Live Births               Home Medications    Prior to Admission medications   Medication Sig Start Date End Date Taking? Authorizing Provider  acetaminophen (TYLENOL) 500 MG tablet Take 500 mg by mouth every 6 (six) hours as needed for mild pain or moderate pain.    [provider]  albuterol (PROVENTIL HFA;VENTOLIN HFA) 108 (90 Base) MCG/ACT inhaler Inhale 2 puffs into the lungs every 4 (four) hours as needed for wheezing or shortness of breath. 07/23/15   Holton, Modena Nunnery, MD  ALPRAZolam Duanne Moron) 0.25 MG tablet Take 1 tablet (0.25 mg total) by mouth at bedtime as needed. 12/12/17   Alycia Rossetti, MD  calcium citrate-vitamin D (CITRACAL+D) 315-200 MG-UNIT per tablet Take 1 tablet by mouth daily.      [provider]  Cranberry 250 MG CAPS Take 250 mg by mouth daily.    [provider]  Cyanocobalamin (VITAMIN B 12 PO) Take 1 tablet by mouth daily.     [provider]  escitalopram (LEXAPRO) 10 MG tablet Take 1 tablet (10 mg total) by mouth daily. 04/08/18   Susy Frizzle, MD  esomeprazole (NEXIUM) 40 MG capsule  TAKE 1 CAPSULE BY MOUTH DAILY BEFORE BREAKFAST. 06/12/17   Mahala Menghini, PA-C  ezetimibe (ZETIA) 10 MG tablet Take 1 tablet (10 mg total) by mouth daily. 03/26/17   Herminio Commons, MD  ferrous sulfate 325 (65 FE) MG tablet Take 325 mg by mouth daily with breakfast.    [provider]  Fluticasone-Salmeterol (ADVAIR) 100-50 MCG/DOSE AEPB Inhale 1 puff into the lungs 2 (two) times daily. 12/15/15   Tryon, Modena Nunnery, MD  gabapentin (NEURONTIN) 100 MG capsule TAKE 1 TO 2 CAPSULES BY MOUTH AT BEDTIME FOR NERVE PAIN. 12/12/17   Alycia Rossetti, MD  hydrOXYzine (ATARAX/VISTARIL) 25 MG tablet 1 PO Q8H PRN FOR ITCHING. 05/14/18   Alycia Rossetti, MD  lubiprostone (  AMITIZA) 8 MCG capsule Take 8 mcg by mouth as needed.     [provider]  mirabegron ER (MYRBETRIQ) 25 MG TB24 tablet Take 1 tablet (25 mg total) by mouth at bedtime. For bladder 07/23/15   Alycia Rossetti, MD  Multiple Vitamin (MULTIVITAMIN WITH MINERALS) TABS tablet Take 1 tablet by mouth daily. 08/05/16   Johnson, Clanford L, MD  Probiotic Product (PROBIOTIC DAILY PO) Take 1 capsule by mouth daily.     [provider]  Tiotropium Bromide Monohydrate (SPIRIVA RESPIMAT) 1.25 MCG/ACT AERS Inhale 2 puffs into the lungs daily. 06/14/16   Alycia Rossetti, MD    Family History Family History  Problem Relation Age of Onset  . Liver disease Mother 37  . Hypertension Mother   . Cerebral aneurysm Father   . Aneurysm Father 43  . Cancer Sister   . Liver disease Brother   . Liver disease Brother   . Liver disease Brother   . Diabetes type II Brother   . Alcohol abuse Brother   . Colon cancer Neg Hx     Social History Social History   Tobacco Use  . Smoking status: Current Every Day Smoker    Packs/day: 1.00    Years: 50.00    Pack years: 50.00    Types: Cigarettes    Start date: 02/02/1966  . Smokeless tobacco: Never Used  . Tobacco comment: 1/2 pack daily  Substance Use Topics  . Alcohol use: No      Alcohol/week: 0.0 standard drinks    Comment: H/O of 6 pack per day quiting in 2003  . Drug use: No     Allergies   Ace inhibitors; Aspirin; Codeine; Pineapple; Plasticized base [plastibase]; Ultram [tramadol]; Adhesive [tape]; Naprosyn [naproxen]; Biaxin [clarithromycin]; Clindamycin/lincomycin; Linzess [linaclotide]; Metronidazole; Nsaids; Penicillins; Varenicline tartrate; and Xarelto [rivaroxaban]   Review of Systems Review of Systems ROS: Statement: All systems negative except as marked or noted in the HPI; Constitutional: Negative for fever and chills. ; ; Eyes: Negative for eye pain, redness and discharge. ; ; ENMT: Negative for ear pain, hoarseness, nasal congestion, sinus pressure and sore throat. ; ; Cardiovascular: Negative for chest pain, palpitations, diaphoresis, dyspnea and peripheral edema. ; ; Respiratory: Negative for cough, wheezing and stridor. ; ; Gastrointestinal: Negative for nausea, vomiting, diarrhea, abdominal pain, blood in stool, hematemesis, jaundice and rectal bleeding. . ; ; Genitourinary: Negative for dysuria, flank pain and hematuria. ; ; Musculoskeletal: +right hip pain. Negative for back pain and neck pain. Negative for swelling and trauma.; ; Skin: Negative for pruritus, rash, abrasions, blisters, bruising and skin lesion.; ; Neuro: Negative for headache, lightheadedness and neck stiffness. Negative for weakness, altered level of consciousness, altered mental status, extremity weakness, paresthesias, involuntary movement, seizure and syncope.       Physical Exam Updated Vital Signs BP (!) 154/58 (BP Location: Right Arm)   Pulse (!) 59   Temp 97.9 F (36.6 C) (Oral)   Resp 18   Physical Exam 1725: Physical examination:  Nursing notes reviewed; Vital signs and O2 SAT reviewed;  Constitutional: Well developed, Well nourished, Well hydrated, In no acute distress; Head:  Normocephalic, atraumatic; Eyes: EOMI, PERRL, No scleral icterus; ENMT: Mouth and  pharynx normal, Mucous membranes moist; Neck: Supple, Full range of motion, No lymphadenopathy; Cardiovascular: Regular rate and rhythm, No gallop; Respiratory: Breath sounds clear & equal bilaterally, No wheezes.  Speaking full sentences with ease, Normal respiratory effort/excursion; Chest: Nontender, Movement normal; Abdomen: Soft, Nontender, Nondistended, Normal  bowel sounds; Genitourinary: No CVA tenderness; Spine:  No midline CS, TS, LS tenderness.;;  Extremities: Peripheral pulses normal, Pelvis stable. +TTP right hip, no deformity, no soft tissue crepitus. NT right knee/ankle/foot.  No edema, No calf tenderness, edema or asymmetry.; Neuro: AA&Ox3, Major CN grossly intact.  Speech clear. No gross focal motor or sensory deficits in extremities. Pt able to slowly move from wheelchair to stretcher with multiple RN's assist d/t right hip pain with weightbearing.;;; Skin: Color normal, Warm, Dry.   ED Treatments / Results  Labs (all labs ordered are listed, but only abnormal results are displayed)   EKG None  Radiology   Procedures Procedures (including critical care time)  Medications Ordered in ED Medications  HYDROcodone-acetaminophen (NORCO/VICODIN) 5-325 MG per tablet 1 tablet (1 tablet Oral Given 05/17/18 1729)     Initial Impression / Assessment and Plan / ED Course  I have reviewed the triage vital signs and the nursing notes.  Pertinent labs & imaging results that were available during my care of the patient were reviewed by me and considered in my medical decision making (see chart for details).  MDM Reviewed: previous chart, nursing note and vitals Interpretation: x-ray    Dg Lumbar Spine Complete Result Date: 05/17/2018 CLINICAL DATA:  Hip pain EXAM: LUMBAR SPINE - COMPLETE 4+ VIEW COMPARISON:  CT 01/03/2016 FINDINGS: Surgical clips in the right upper quadrant. Five non rib-bearing lumbar type vertebra. Trace anterolisthesis L4 on L5. Vertebral body heights are  maintained. Mild degenerative changes at L2-L3, L3-L4 and L4-L5. Aortic atherosclerosis. IMPRESSION: Mild degenerative changes.  No acute osseous abnormality. Electronically Signed   By: Donavan Foil M.D.   On: 05/17/2018 20:58   Ct Hip Right Wo Contrast Result Date: 05/17/2018 CLINICAL DATA:  Right hip and leg pain for 2 weeks. No known injury. EXAM: CT OF THE RIGHT HIP WITHOUT CONTRAST TECHNIQUE: Multidetector CT imaging of the right hip was performed according to the standard protocol. Multiplanar CT image reconstructions were also generated. COMPARISON:  Plain films right hip this same day. CT abdomen and pelvis 01/03/2016. FINDINGS: Bones/Joint/Cartilage No acute bony or joint abnormality is identified. No lytic or sclerotic lesion is seen. No avascular necrosis of the femoral head. No notable degenerative disease about the hip. There is partial visualization of loss of disc space height and a disc bulge at L4-5. Ligaments Suboptimally assessed by CT. Muscles and Tendons Intact and normal in appearance. Soft tissues Atherosclerosis is noted.  The patient is status post hysterectomy. IMPRESSION: Normal-appearing right hip.  No acute abnormality. Partial visualization of a broad-based disc bulge at L4-5. Atherosclerosis. Electronically Signed   By: Inge Rise M.D.   On: 05/17/2018 20:08   Dg Knee Complete 4 Views Right Result Date: 05/17/2018 CLINICAL DATA:  Right groin pain to the knee for 1 week. No known injury. EXAM: RIGHT KNEE - COMPLETE 4+ VIEW COMPARISON:  None. FINDINGS: No evidence of fracture, dislocation, or joint effusion. Osteophytosis is seen about the knee. Atherosclerosis noted. Soft tissues are unremarkable. IMPRESSION: No acute abnormality. Mild appearing osteoarthritis. Atherosclerosis. Electronically Signed   By: Inge Rise M.D.   On: 05/17/2018 19:02   Dg Hip Unilat With Pelvis 2-3 Views Right Result Date: 05/17/2018 CLINICAL DATA:  Right groin pain for 1 week.  No  known injury. EXAM: DG HIP (WITH OR WITHOUT PELVIS) 2-3V RIGHT COMPARISON:  None. FINDINGS: There is no evidence of hip fracture or dislocation. There is no evidence of arthropathy or other focal bone abnormality. Atherosclerosis  noted. IMPRESSION: Normal-appearing right hip. Atherosclerosis. Electronically Signed   By: Inge Rise M.D.   On: 05/17/2018 19:01    2120:  Pt has received pain meds. Pt has ambulated several times to the bathroom without distress, gait steady, easy resps; per her baseline gait (walks with walker). Has tol PO well without N/V. Pt states she feels better now and wants to go home. XR/CT reassuring. Tx symptomatically at this time. Dx and testing d/w pt and family.  Questions answered.  Verb understanding, agreeable to d/c home with outpt f/u.      Final Clinical Impressions(s) / ED Diagnoses   Final diagnoses:  Pain    ED Discharge Orders    None       Francine Graven, DO 05/22/18 1252

## 2018-05-17 NOTE — ED Triage Notes (Signed)
Pt c/o of pain in her right leg x 2 weeks. Denies any new injuries

## 2018-05-17 NOTE — ED Notes (Signed)
Patient ambulated to restroom. Unsteady on feet without support, uses walker at home. Gave patient crackers with peanut butter and a Ginger Ale. Patient states she feels much better now.

## 2018-05-20 ENCOUNTER — Other Ambulatory Visit: Payer: Self-pay

## 2018-05-20 ENCOUNTER — Encounter: Payer: Self-pay | Admitting: Family Medicine

## 2018-05-20 ENCOUNTER — Ambulatory Visit (INDEPENDENT_AMBULATORY_CARE_PROVIDER_SITE_OTHER): Payer: Medicare HMO | Admitting: Family Medicine

## 2018-05-20 VITALS — BP 112/66 | HR 64 | Temp 98.4°F | Resp 14 | Ht 67.0 in | Wt 148.0 lb

## 2018-05-20 DIAGNOSIS — R0989 Other specified symptoms and signs involving the circulatory and respiratory systems: Secondary | ICD-10-CM | POA: Diagnosis not present

## 2018-05-20 DIAGNOSIS — M25551 Pain in right hip: Secondary | ICD-10-CM

## 2018-05-20 DIAGNOSIS — M5136 Other intervertebral disc degeneration, lumbar region: Secondary | ICD-10-CM | POA: Diagnosis not present

## 2018-05-20 MED ORDER — METHYLPREDNISOLONE 4 MG PO TBPK: ORAL_TABLET | ORAL | 0 refills | Status: DC

## 2018-05-20 NOTE — Patient Instructions (Addendum)
Take the prednisone Use the tylenol for pain You can stop the hydrocodone Ultrasound of carotids to be done  F/U 2 months

## 2018-05-20 NOTE — Progress Notes (Signed)
Subjective:    Patient ID: Charlotte Henry, female    DOB: [birth year], 77 y.o.   MRN: [id number]  Patient presents for R Hip Pain (was seen in ER for pain)   Pt here for ER follow up. She was in ER on last Friday with Right hip pain, she had been dragging her foot, has pain in the groin down to her thigh and toward her buttocks. She denies any radiating pain down her leg. Pain has been present for the past 2 weeks. She denies any pain into her back at all.   No falls , no injury   Reviewed ER note, she was given norco, but this did not help   Hip xray - was normal , CT of hip was normal but showed broad based disc bulge at L4-L5  Xray of Lumbar spine- mild Degenerative changes   She states that she has to physically pick up her right leg because she has so much pain in her groin.  She is still taking her Tylenol regularly.  There is been no change in bowel or bladder.  Medications reviewed she was changed to Lexapro at her last visit in November she also did not know the difference between that and the mirtazapine.  She has not had any side effects that she is aware of.  She is also worried about the artery on the right side of her neck.  States that she hears a roaring noise at times and that it pulsates.   Review Of Systems:  GEN- denies fatigue, fever, weight loss,weakness, recent illness HEENT- denies eye drainage, change in vision, nasal discharge, CVS- denies chest pain, palpitations RESP- denies SOB, cough, wheeze ABD- denies N/V, change in stools, abd pain GU- denies dysuria, hematuria, dribbling, incontinence MSK- + joint pain, muscle aches, injury Neuro- denies headache, dizziness, syncope, seizure activity       Objective:    BP 112/66   Pulse 64   Temp 98.4 F (36.9 C) (Oral)   Resp 14   Ht 5\' 7"  (1.702 m)   Wt 148 lb (67.1 kg)   SpO2 98%   BMI 23.18 kg/m  GEN- NAD, alert and oriented x3 HEENT- PERRL, EOMI, non injected sclera, pink conjunctiva, MMM,  oropharynx clear Neck- Supple, no thyromegaly, soft right carotid bruit  CVS- RRR, 2/6 systolic murmur RESP-CTAB ABD-NABS,soft,NT,ND MSK- decreased ROM upper and lower ext, , TTP along right groin to latearl aspect of hip, pain with IR/ER, pain with extension of RLE, she physically picks up leg to move Able to weight bear, walks with cane  EXT-no  edema Pulses- Radial 2+, DP diminished       Assessment & Plan:      Problem List Items Addressed This Visit    None    Visit Diagnoses    Right hip pain    -  Primary   Normal imaging of hip, yet pain centered at groin and hip, ? groin strain, she has DDD lumbar spine but not classic symptoms of this. Given medrol dosepak, continue tyelnol Pain affecting gait and activity  appt to me made with orthopedics    Relevant Orders   Ambulatory referral to Orthopedic Surgery   DDD (degenerative disc disease), lumbar       Relevant Medications   methylPREDNISolone (MEDROL DOSEPAK) 4 MG TBPK tablet   Other Relevant Orders   Ambulatory referral to Orthopedic Surgery   Bruit of right carotid artery       Obtain  ultrasound of carotids, no Lymphadenopathy or mass felt   Relevant Orders   US Carotid Duplex Bilateral      Note: This dictation was prepared with Dragon dictation along with smaller phrase technology. Any transcriptional errors that result from this process are unintentional.

## 2018-05-24 ENCOUNTER — Ambulatory Visit (HOSPITAL_COMMUNITY): Payer: Medicare HMO

## 2018-05-27 ENCOUNTER — Encounter: Payer: Self-pay | Admitting: Gastroenterology

## 2018-05-27 ENCOUNTER — Ambulatory Visit (HOSPITAL_COMMUNITY)
Admission: RE | Admit: 2018-05-27 | Discharge: 2018-05-27 | Disposition: A | Discharge status: Home / Self Care | Payer: Medicare HMO | Source: Ambulatory Visit | Attending: Family Medicine | Admitting: Family Medicine

## 2018-05-27 DIAGNOSIS — R0989 Other specified symptoms and signs involving the circulatory and respiratory systems: Secondary | ICD-10-CM | POA: Diagnosis not present

## 2018-05-27 DIAGNOSIS — I6523 Occlusion and stenosis of bilateral carotid arteries: Secondary | ICD-10-CM | POA: Diagnosis not present

## 2018-05-30 ENCOUNTER — Other Ambulatory Visit: Payer: Self-pay | Admitting: *Deleted

## 2018-05-30 DIAGNOSIS — I6523 Occlusion and stenosis of bilateral carotid arteries: Secondary | ICD-10-CM

## 2018-05-30 MED ORDER — ATORVASTATIN CALCIUM 10 MG PO TABS: 10.0000 mg | ORAL_TABLET | ORAL | 3 refills | Status: DC

## 2018-06-09 LAB — CUP PACEART REMOTE DEVICE CHECK
Battery Remaining Longevity: 112 mo
Battery Remaining Percentage: 95.5 %
Battery Voltage: 2.99 V
Brady Statistic AP VP Percent: 47 %
Brady Statistic AP VS Percent: 1 %
Brady Statistic AS VP Percent: 53 %
Brady Statistic AS VS Percent: 1 %
Brady Statistic RA Percent Paced: 46 %
Brady Statistic RV Percent Paced: 99 %
Date Time Interrogation Session: 20191104145640
Implantable Lead Implant Date: 20171020
Implantable Lead Location: 753859
Implantable Lead Location: 753860
Implantable Pulse Generator Implant Date: 20171020
Lead Channel Impedance Value: 410 Ohm
Lead Channel Impedance Value: 530 Ohm
Lead Channel Pacing Threshold Amplitude: 0.75 V
Lead Channel Pacing Threshold Pulse Width: 0.4 ms
Lead Channel Pacing Threshold Pulse Width: 0.4 ms
Lead Channel Sensing Intrinsic Amplitude: 3.1 mV
Lead Channel Sensing Intrinsic Amplitude: 8.3 mV
Lead Channel Setting Pacing Amplitude: 2 V
Lead Channel Setting Pacing Amplitude: 2.5 V
Lead Channel Setting Pacing Pulse Width: 0.4 ms
Lead Channel Setting Sensing Sensitivity: 3.5 mV
MDC IDC LEAD IMPLANT DT: 20171020
MDC IDC MSMT LEADCHNL RA PACING THRESHOLD AMPLITUDE: 0.5 V
Pulse Gen Model: 2272
Pulse Gen Serial Number: 3180497

## 2018-06-19 ENCOUNTER — Ambulatory Visit (INDEPENDENT_AMBULATORY_CARE_PROVIDER_SITE_OTHER): Payer: Medicare HMO | Admitting: Internal Medicine

## 2018-06-19 ENCOUNTER — Encounter: Payer: Self-pay | Admitting: Internal Medicine

## 2018-06-19 DIAGNOSIS — I442 Atrioventricular block, complete: Secondary | ICD-10-CM

## 2018-06-19 LAB — CUP PACEART INCLINIC DEVICE CHECK
Battery Remaining Longevity: 106 mo
Battery Voltage: 2.99 V
Brady Statistic RA Percent Paced: 48 %
Brady Statistic RV Percent Paced: 99.86 %
Date Time Interrogation Session: 20200115161442
Implantable Lead Implant Date: 20171020
Implantable Lead Implant Date: 20171020
Implantable Lead Location: 753859
Implantable Lead Location: 753860
Lead Channel Impedance Value: 375 Ohm
Lead Channel Impedance Value: 412.5 Ohm
Lead Channel Pacing Threshold Amplitude: 0.5 V
Lead Channel Pacing Threshold Amplitude: 0.75 V
Lead Channel Pacing Threshold Pulse Width: 0.4 ms
Lead Channel Pacing Threshold Pulse Width: 0.4 ms
Lead Channel Pacing Threshold Pulse Width: 0.4 ms
Lead Channel Sensing Intrinsic Amplitude: 2.1 mV
Lead Channel Sensing Intrinsic Amplitude: 8.4 mV
Lead Channel Setting Pacing Amplitude: 2 V
Lead Channel Setting Pacing Pulse Width: 0.4 ms
Lead Channel Setting Sensing Sensitivity: 3.5 mV
MDC IDC MSMT LEADCHNL RA PACING THRESHOLD AMPLITUDE: 0.5 V
MDC IDC MSMT LEADCHNL RV PACING THRESHOLD AMPLITUDE: 0.75 V
MDC IDC MSMT LEADCHNL RV PACING THRESHOLD PULSEWIDTH: 0.4 ms
MDC IDC PG IMPLANT DT: 20171020
MDC IDC SET LEADCHNL RV PACING AMPLITUDE: 2.5 V
Pulse Gen Model: 2272
Pulse Gen Serial Number: 3180497

## 2018-06-19 NOTE — Progress Notes (Signed)
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 She has been stable in the interim, with no chest pain or shortness of breath.  She denies anginal symptoms. She has not had syncope. She still smokes cigarettes. Not interested in stopping. Allergies  Allergen Reactions  . Ace Inhibitors Hives, Shortness Of Breath and Swelling  . Aspirin Other (See Comments)    Blood in stool and nose bleed  . Codeine Swelling  . Pineapple Shortness Of Breath and Swelling  . Plasticized Base [Plastibase] Hives and Swelling    No plastics  . Ultram [Tramadol] Swelling  . Adhesive [Tape] Swelling and Rash  . Naprosyn [Naproxen] Swelling  . Biaxin [Clarithromycin] Nausea And Vomiting and Other (See Comments)    Dizziness.  . Clindamycin/Lincomycin Other (See Comments)    Burning sensation throat, abdomen  . Linzess [Linaclotide]     DIARRHEA, FECAL INCONTINENCE  . Metronidazole     NAUSEA AND WEAKNESS  . Nsaids   . Penicillins     Lip swelling, Hives Has patient had a PCN reaction causing immediate rash, facial/tongue/throat swelling, SOB or lightheadedness with hypotension:YES Has patient had a PCN reaction causing severe rash involving mucus membranes or skin necrosis: NO Has patient had a PCN reaction that required hospitalization NO Has patient had a PCN reaction occurring within the last 10 years: NO If all of the above answers are "NO", then may proceed with Cephalosporin use.  . Varenicline Tartrate Swelling  . Xarelto [Rivaroxaban]     Bleeding     Current Outpatient Medications  Medication Sig Dispense Refill  . acetaminophen (TYLENOL) 500 MG tablet Take 500 mg by mouth every 6 (six) hours as needed for mild pain or moderate pain.    Marland Kitchen albuterol (PROVENTIL HFA;VENTOLIN HFA) 108 (90 Base) MCG/ACT inhaler Inhale 2 puffs into the lungs every 4 (four) hours as needed for wheezing or shortness of breath. 1  Inhaler 2  . ALPRAZolam (XANAX) 0.25 MG tablet Take 1 tablet (0.25 mg total) by mouth at bedtime as needed. 30 tablet 1  . atorvastatin (LIPITOR) 10 MG tablet Take 1 tablet (10 mg total) by mouth 3 (three) times a week. 30 tablet 3  . calcium citrate-vitamin D (CITRACAL+D) 315-200 MG-UNIT per tablet Take 1 tablet by mouth daily.      . Cranberry 250 MG CAPS Take 250 mg by mouth daily.    . Cyanocobalamin (VITAMIN B 12 PO) Take 1 tablet by mouth daily.     Marland Kitchen escitalopram (LEXAPRO) 10 MG tablet Take 1 tablet (10 mg total) by mouth daily. 30 tablet 5  . esomeprazole (NEXIUM) 40 MG capsule TAKE 1 CAPSULE BY MOUTH DAILY BEFORE BREAKFAST. 30 capsule 11  . ezetimibe (ZETIA) 10 MG tablet Take 1 tablet (10 mg total) by mouth daily. 90 tablet 3  . ferrous sulfate 325 (65 FE) MG tablet Take 325 mg by mouth daily with breakfast.    . Fluticasone-Salmeterol (ADVAIR) 100-50 MCG/DOSE AEPB Inhale 1 puff into the lungs 2 (two) times daily. 1 each 3  . gabapentin (NEURONTIN) 100 MG capsule TAKE 1 TO 2 CAPSULES BY MOUTH AT BEDTIME FOR NERVE PAIN. 60 capsule 6  . HYDROcodone-acetaminophen (NORCO/VICODIN) 5-325 MG tablet 1 tab PO q12 hours prn pain 8 tablet 0  . hydrOXYzine (ATARAX/VISTARIL) 25 MG tablet 1 PO Q8H PRN FOR ITCHING. 45 tablet 0  . lubiprostone (AMITIZA) 8 MCG capsule Take 8 mcg by  mouth as needed.     . methylPREDNISolone (MEDROL DOSEPAK) 4 MG TBPK tablet Take as directed on package 21 tablet 0  . mirabegron ER (MYRBETRIQ) 25 MG TB24 tablet Take 1 tablet (25 mg total) by mouth at bedtime. For bladder 30 tablet 6  . Multiple Vitamin (MULTIVITAMIN WITH MINERALS) TABS tablet Take 1 tablet by mouth daily.    . Probiotic Product (PROBIOTIC DAILY PO) Take 1 capsule by mouth daily.     . Tiotropium Bromide Monohydrate (SPIRIVA RESPIMAT) 1.25 MCG/ACT AERS Inhale 2 puffs into the lungs daily. 1 Inhaler 11   No current facility-administered medications for this visit.      Past Medical History:  Diagnosis  Date  . 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 ASCVD (arteriosclerotic cardiovascular disease)    a. cath in 4/04,-80%LAD; 70% RCA, -> DESx2; residual 60% distal RCA; nl EF  b. Nuc 02/2016 aborted due to bradycardia. Will need to be rescheduled   . Carotid artery occlusion   . Cerebrovascular disease   . CHB (complete heart block) ([address])    a. s/p PPM on 03/24/16 with a St. Jude (serial number [id number]) pacemaker  . Chronic kidney disease (CKD), stage III (moderate) ([facility])   . Clostridium difficile colitis 03/2013   a. 03/2013  . COPD (chronic obstructive pulmonary disease) ([place])   . Coronary disease   . Diverticulosis   . GERD (gastroesophageal reflux disease)   . Gout   . H pylori ulcer   . Hyperlipidemia    chose to stop statin, takes Zetia  . Hypertension    not currently on medication  . IBS (irritable bowel syndrome)   . PVD (peripheral vascular disease) ([place])    a. moderate to severely decreased ABI's in 8/08, sig. aortic inflow disease b. repeat ABI's in 2011 improved- mild disease on the left and moderate to severe disease on the right  . S/P placement of cardiac pacemaker    a. 03/24/16: St. Jude (serial number [id number]) pacemaker  . Symptomatic anemia 08/15/2016  . TIA (transient ischemic attack) 2012  . Tobacco abuse    a. 50 pack years continuing at 1/2 pack per day  . Tobacco use disorder 10/15/2015    ROS:   All systems reviewed and negative except as noted in the HPI.   Past Surgical History:  Procedure Laterality Date  . ABDOMINAL HYSTERECTOMY    . carpel tunnel release     bilateral  . CHOLECYSTECTOMY    . COLONOSCOPY  2009  . COLONOSCOPY N/A 08/04/2016   incomplete due to prep.   . COLONOSCOPY N/A 08/05/2016   Dr. Oneida Alar: moderately redundant rectosigmoid and sigmoid, normal TI, single large-mouthed diverticulum in hepatic flexure, external hemorrhoids, path negative for microscopic colitis   . ENTEROSCOPY N/A 08/18/2016   Procedure: ENTEROSCOPY;  Surgeon:  Daneil Dolin, MD;  Location: [facility];  Service: Endoscopy;  Laterality: N/A;  Pediatric colonoscope  . EP IMPLANTABLE DEVICE N/A 03/24/2016   Procedure: Pacemaker Implant;  Surgeon: [name], MD;  Location: [place] [facility];  Service: Cardiovascular;  Laterality: N/A;  . ESOPHAGOGASTRODUODENOSCOPY N/A 08/04/2016   Dr. Oneida Alar: non-critical Schatzki's ring, small hiatal hernia, +H.pylori gastritis on path.   Marland Kitchen GIVENS CAPSULE STUDY N/A 08/16/2016   Procedure: GIVENS CAPSULE STUDY;  Surgeon: Daneil Dolin, MD;  Location: [facility];  Service: Endoscopy;  Laterality: N/A;  . TOTAL ABDOMINAL HYSTERECTOMY W/ BILATERAL SALPINGOOPHORECTOMY  1979     Family History  Problem  Relation Age of Onset  . Liver disease Mother 73  . Hypertension Mother   . Cerebral aneurysm Father   . Aneurysm Father 12  . Cancer Sister   . Liver disease Brother   . Liver disease Brother   . Liver disease Brother   . Diabetes type II Brother   . Alcohol abuse Brother   . Colon cancer Neg Hx      Social History   Socioeconomic History  . Marital status: Widowed    Spouse name: Not on file  . Number of children: Not on file  . Years of education: Not on file  . Highest education level: Not on file  Occupational History  . Not on file  Social Needs  . Financial resource strain: Not on file  . Food insecurity:    Worry: Not on file    Inability: Not on file  . Transportation needs:    Medical: Not on file    Non-medical: Not on file  Tobacco Use  . Smoking status: Current Every Day Smoker    Packs/day: 1.00    Years: 50.00    Pack years: 50.00    Types: Cigarettes    Start date: 02/02/1966  . Smokeless tobacco: Never Used  . Tobacco comment: 1/2 pack daily  Substance and Sexual Activity  . Alcohol use: No    Alcohol/week: 0.0 standard drinks    Comment: H/O of 6 pack per day quiting in 2003  . Drug use: No  . Sexual activity: Not Currently  Lifestyle  . Physical activity:     Days per week: Not on file    Minutes per session: Not on file  . Stress: Not on file  Relationships  . Social connections:    Talks on phone: Not on file    Gets together: Not on file    Attends religious service: Not on file    Active member of club or organization: Not on file    Attends meetings of clubs or organizations: Not on file    Relationship status: Not on file  . Intimate partner violence:    Fear of current or ex partner: No    Emotionally abused: No    Physically abused: Not on file    Forced sexual activity: No  Other Topics Concern  . Not on file  Social History Narrative  . Not on file     BP 132/78   Ht 5\' 7"  (1.702 m)   Wt 145 lb 12.8 oz (66.1 kg)   BMI 22.84 kg/m   Physical Exam:  Well appearing 78 yo woman, NAD HEENT: Unremarkable Neck:  No JVD, no thyromegally Lymphatics:  No adenopathy Back:  No CVA tenderness Lungs:  Clear with faint expiratory wheezes HEART:  Regular rate rhythm, no murmurs, no rubs, no clicks Abd:  soft, positive bowel sounds, no organomegally, no rebound, no guarding Ext:  2 plus pulses, no edema, no cyanosis, no clubbing Skin:  No rashes no nodules Neuro:  CN II through XII intact, motor grossly intact  EKG - none  DEVICE  Normal device function.  See PaceArt for details.   Assess/Plan: 1. CHB - she is maintaining NSR with a long AV delay today. She is asymptomatic. 2. PPM - her St. Jude DDD PM is working normally. 3. Tobacco abuse - she is encouraged to stop smoking.  4. HTN - her blood pressure is well controlled. She is encouraged to maintain a low sodium diet.  Charlotte Henry  Kandra Graven,M.D.

## 2018-06-19 NOTE — Patient Instructions (Signed)
Medication Instructions:  Your physician recommends that you continue on your current medications as directed. Please refer to the Current Medication list given to you today.  If you need a refill on your cardiac medications before your next appointment, please call your pharmacy.   Lab work: NONE   If you have labs (blood work) drawn today and your tests are completely normal, you will receive your results only by: . MyChart Message (if you have MyChart) OR . A paper copy in the mail If you have any lab test that is abnormal or we need to change your treatment, we will call you to review the results.  Testing/Procedures: NONE   Follow-Up: At [facility], you and your health needs are our priority.  As part of our continuing mission to provide you with exceptional heart care, we have created designated Provider Care Teams.  These Care Teams include your primary Cardiologist (physician) and Advanced Practice Providers (APPs -  Physician Assistants and Nurse Practitioners) who all work together to provide you with the care you need, when you need it. You will need a follow up appointment in 1 years.  Please call our office 2 months in advance to schedule this appointment.  You may see Gregg Taylor, MD or one of the following Advanced Practice Providers on your designated Care Team:   Amber Seiler, NP . Renee Ursuy, PA-C  Any Other Special Instructions Will Be Listed Below (If Applicable). Thank you for choosing [facility]!     

## 2018-06-21 ENCOUNTER — Ambulatory Visit (HOSPITAL_COMMUNITY): Payer: Medicare HMO

## 2018-06-21 ENCOUNTER — Ambulatory Visit: Payer: Medicare HMO | Admitting: Family Medicine

## 2018-06-21 ENCOUNTER — Other Ambulatory Visit (HOSPITAL_COMMUNITY): Payer: Medicare HMO

## 2018-06-26 ENCOUNTER — Ambulatory Visit (INDEPENDENT_AMBULATORY_CARE_PROVIDER_SITE_OTHER): Payer: Medicare HMO | Admitting: Orthopedic Surgery

## 2018-06-26 ENCOUNTER — Ambulatory Visit (HOSPITAL_COMMUNITY): Payer: Medicare HMO | Admitting: Hematology

## 2018-06-26 ENCOUNTER — Encounter: Payer: Self-pay | Admitting: Orthopedic Surgery

## 2018-06-26 VITALS — BP 133/78 | HR 60 | Ht 67.0 in | Wt 145.0 lb

## 2018-06-26 DIAGNOSIS — M48061 Spinal stenosis, lumbar region without neurogenic claudication: Secondary | ICD-10-CM

## 2018-06-26 DIAGNOSIS — I998 Other disorder of circulatory system: Secondary | ICD-10-CM

## 2018-06-26 MED ORDER — LIDOCAINE 5 % EX PTCH: 1.0000 | MEDICATED_PATCH | CUTANEOUS | 0 refills | Status: DC

## 2018-06-26 NOTE — Progress Notes (Signed)
NEW PATIENT OFFICE VISIT  Chief Complaint  Patient presents with  . Leg Pain    right lateral hip/ leg painful  . Hip Pain    right groin pain     78 year old female presents for evaluation of pain in her right hip and groin and right leg.  The location of the pain is in the right groin and right thigh and right hip Quality dull throbbing Severity pain scale 8 out of 10 Duration over 1 month Timing seems to be worse with ambulation Context no improvement  Patient reports ace traumatic onset of pain in the right hip and groin radiating to her right knee and thigh.  She was evaluated in the emergency room with a plain film and a CAT scan of her hip which showed no abnormality.  She also had lumbar spine x-rays which show multilevel disc disease facet arthritis and possibly spinal stenosis  Her prior treatment includes hydrocodone which has been stopped gabapentin which she is currently still on without relief she does use a cane in the right hand to help her walk  She has a 30-pack-year smoking history   Review of Systems  Constitutional: Negative for chills, diaphoresis, fever, malaise/fatigue and weight loss.  Musculoskeletal: Positive for back pain.  Neurological: Negative for tingling, tremors, sensory change and focal weakness.  All other systems reviewed and are negative.    Past Medical History:  Diagnosis Date  . 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 ASCVD (arteriosclerotic cardiovascular disease)    a. cath in 4/04,-80%LAD; 70% RCA, -> DESx2; residual 60% distal RCA; nl EF  b. Nuc 02/2016 aborted due to bradycardia. Will need to be rescheduled   . Carotid artery occlusion   . Cerebrovascular disease   . CHB (complete heart block) ([place])    a. s/p PPM on 03/24/16 with a St. Jude (serial number [id number]) pacemaker  . Chronic kidney disease (CKD), stage III (moderate) (HCC)   . Clostridium difficile colitis 03/2013   a. 03/2013  . COPD (chronic obstructive  pulmonary disease) ([place])   . Coronary disease   . Diverticulosis   . GERD (gastroesophageal reflux disease)   . Gout   . H pylori ulcer   . Hyperlipidemia    chose to stop statin, takes Zetia  . Hypertension    not currently on medication  . IBS (irritable bowel syndrome)   . PVD (peripheral vascular disease) ([place])    a. moderate to severely decreased ABI's in 8/08, sig. aortic inflow disease b. repeat ABI's in 2011 improved- mild disease on the left and moderate to severe disease on the right  . S/P placement of cardiac pacemaker    a. 03/24/16: St. Jude (serial number [id number]) pacemaker  . Symptomatic anemia 08/15/2016  . TIA (transient ischemic attack) 2012  . Tobacco abuse    a. 50 pack years continuing at 1/2 pack per day  . Tobacco use disorder 10/15/2015    Past Surgical History:  Procedure Laterality Date  . ABDOMINAL HYSTERECTOMY    . carpel tunnel release     bilateral  . CHOLECYSTECTOMY    . COLONOSCOPY  2009  . COLONOSCOPY N/A 08/04/2016   incomplete due to prep.   . COLONOSCOPY N/A 08/05/2016   Dr. Oneida Alar: moderately redundant rectosigmoid and sigmoid, normal TI, single large-mouthed diverticulum in hepatic flexure, external hemorrhoids, path negative for microscopic colitis   . ENTEROSCOPY N/A 08/18/2016   Procedure: ENTEROSCOPY;  Surgeon: Daneil Dolin, MD;  Location[facility];  Service: Endoscopy;  Laterality: N/A;  Pediatric colonoscope  . EP IMPLANTABLE DEVICE N/A 03/24/2016   Procedure: Pacemaker Implant;  Surgeon: [name], MD;  Location: [facility];  Service: Cardiovascular;  Laterality: N/A;  . ESOPHAGOGASTRODUODENOSCOPY N/A 08/04/2016   Dr. Oneida Alar: non-critical Schatzki's ring, small hiatal hernia, +H.pylori gastritis on path.   Marland Kitchen GIVENS CAPSULE STUDY N/A 08/16/2016   Procedure: GIVENS CAPSULE STUDY;  Surgeon: Daneil Dolin, MD;  Location: [facility];  Service: Endoscopy;  Laterality: N/A;  . TOTAL ABDOMINAL HYSTERECTOMY W/ BILATERAL  SALPINGOOPHORECTOMY  1979    Family History  Problem Relation Age of Onset  . Liver disease Mother 78  . Hypertension Mother   . Cerebral aneurysm Father   . Aneurysm Father 60  . Cancer Sister   . Liver disease Brother   . Liver disease Brother   . Liver disease Brother   . Diabetes type II Brother   . Alcohol abuse Brother   . Colon cancer Neg Hx    Social History   Tobacco Use  . Smoking status: Current Every Day Smoker    Packs/day: 1.00    Years: 50.00    Pack years: 50.00    Types: Cigarettes    Start date: 02/02/1966  . Smokeless tobacco: Never Used  . Tobacco comment: 1/2 pack daily  Substance Use Topics  . Alcohol use: No    Alcohol/week: 0.0 standard drinks    Comment: H/O of 6 pack per day quiting in 2003  . Drug use: No    Allergies  Allergen Reactions  . Ace Inhibitors Hives, Shortness Of Breath and Swelling  . Aspirin Other (See Comments)    Blood in stool and nose bleed  . Codeine Swelling  . Pineapple Shortness Of Breath and Swelling  . Plasticized Base [Plastibase] Hives and Swelling    No plastics  . Ultram [Tramadol] Swelling  . Adhesive [Tape] Swelling and Rash  . Naprosyn [Naproxen] Swelling  . Biaxin [Clarithromycin] Nausea And Vomiting and Other (See Comments)    Dizziness.  . Clindamycin/Lincomycin Other (See Comments)    Burning sensation throat, abdomen  . Linzess [Linaclotide]     DIARRHEA, FECAL INCONTINENCE  . Metronidazole     NAUSEA AND WEAKNESS  . Nsaids   . Penicillins     Lip swelling, Hives Has patient had a PCN reaction causing immediate rash, facial/tongue/throat swelling, SOB or lightheadedness with hypotension:YES Has patient had a PCN reaction causing severe rash involving mucus membranes or skin necrosis: NO Has patient had a PCN reaction that required hospitalization NO Has patient had a PCN reaction occurring within the last 10 years: NO If all of the above answers are "NO", then may proceed with Cephalosporin use.   . Varenicline Tartrate Swelling  . Xarelto [Rivaroxaban]     Bleeding    Current Meds  Medication Sig  . acetaminophen (TYLENOL) 500 MG tablet Take 500 mg by mouth every 6 (six) hours as needed for mild pain or moderate pain.  Marland Kitchen albuterol (PROVENTIL HFA;VENTOLIN HFA) 108 (90 Base) MCG/ACT inhaler Inhale 2 puffs into the lungs every 4 (four) hours as needed for wheezing or shortness of breath.  . ALPRAZolam (XANAX) 0.25 MG tablet Take 1 tablet (0.25 mg total) by mouth at bedtime as needed.  Marland Kitchen atorvastatin (LIPITOR) 10 MG tablet Take 1 tablet (10 mg total) by mouth 3 (three) times a week.  . calcium citrate-vitamin D (CITRACAL+D) 315-200 MG-UNIT per tablet Take 1  tablet by mouth daily.    . Cranberry 250 MG CAPS Take 250 mg by mouth daily.  . Cyanocobalamin (VITAMIN B 12 PO) Take 1 tablet by mouth daily.   Marland Kitchen escitalopram (LEXAPRO) 10 MG tablet Take 1 tablet (10 mg total) by mouth daily.  Marland Kitchen esomeprazole (NEXIUM) 40 MG capsule TAKE 1 CAPSULE BY MOUTH DAILY BEFORE BREAKFAST.  Marland Kitchen ezetimibe (ZETIA) 10 MG tablet Take 1 tablet (10 mg total) by mouth daily.  . ferrous sulfate 325 (65 FE) MG tablet Take 325 mg by mouth daily with breakfast.  . Fluticasone-Salmeterol (ADVAIR) 100-50 MCG/DOSE AEPB Inhale 1 puff into the lungs 2 (two) times daily.  Marland Kitchen gabapentin (NEURONTIN) 100 MG capsule TAKE 1 TO 2 CAPSULES BY MOUTH AT BEDTIME FOR NERVE PAIN.  . hydrOXYzine (ATARAX/VISTARIL) 25 MG tablet 1 PO Q8H PRN FOR ITCHING.  . lubiprostone (AMITIZA) 8 MCG capsule Take 8 mcg by mouth as needed.   . methylPREDNISolone (MEDROL DOSEPAK) 4 MG TBPK tablet Take as directed on package  . mirabegron ER (MYRBETRIQ) 25 MG TB24 tablet Take 1 tablet (25 mg total) by mouth at bedtime. For bladder  . Multiple Vitamin (MULTIVITAMIN WITH MINERALS) TABS tablet Take 1 tablet by mouth daily.  . Probiotic Product (PROBIOTIC DAILY PO) Take 1 capsule by mouth daily.   . Tiotropium Bromide Monohydrate (SPIRIVA RESPIMAT) 1.25 MCG/ACT  AERS Inhale 2 puffs into the lungs daily.    BP 133/78   Pulse 60   Ht 5\' 7"  (1.702 m)   Wt 145 lb (65.8 kg)   BMI 22.71 kg/m  (super 30)  Physical Exam-General appearance the patient has a small frame no acute distress Oriented x3 Mood pleasant affect normal  Ortho Exam  She is walking with a limp she is supported by a right-handed cane  The right and left upper extremity are line normal with normal range of motion there is no instability or strength deficits the skin is normal pulses are excellent lymph nodes are normal sensation is intact  In the lower extremities we find a normal left hip normal left leg alignment with full range of motion of the hip no instability strength normal quadriceps muscle tone normal skin is intact without rash pulses are weak the temperature of the foot is normal there is no edema lymph nodes are negative in the groin sensory exam is normal reflexes are normal no pathologic reflexes are noted Her left heel-to-shin test is normal  Right hip shows weakness of hip flexion tenderness in the greater trochanter right buttock right lower back right thigh lateral and medial down to the knee she has painful range of motion in the right hip the hip is stable she has no atrophy the skin is normal she has a weak dorsalis pedis posterior tibial pulses without temperature change or edema in the right foot right groin is no lymphadenopathy sensory exam is normal her right heel-to-shin test is abnormal neuro no pathologic reflexes and reflex exam is otherwise normal  Balance seems good MEDICAL DECISION SECTION  [facility] done [facility] CLINICAL DATA:  Hip pain   EXAM: LUMBAR SPINE - COMPLETE 4+ VIEW   COMPARISON:  CT 01/03/2016   FINDINGS: Surgical clips in the right upper quadrant. Five non rib-bearing lumbar type vertebra. Trace anterolisthesis L4 on L5. Vertebral body heights are maintained. Mild degenerative changes at L2-L3, L3-L4 and  L4-L5. Aortic atherosclerosis.   IMPRESSION: Mild degenerative changes.  No acute osseous abnormality.     Electronically  Signed   By: Donavan Foil M.D.   On: 05/17/2018 20:58   CLINICAL DATA:  Right groin pain for 1 week.  No known injury.   EXAM: DG HIP (WITH OR WITHOUT PELVIS) 2-3V RIGHT   COMPARISON:  None.   FINDINGS: There is no evidence of hip fracture or dislocation. There is no evidence of arthropathy or other focal bone abnormality. Atherosclerosis noted.   IMPRESSION: Normal-appearing right hip.   Atherosclerosis.     Electronically Signed   By: Inge Rise M.D.   On: 05/17/2018 19:01 CLINICAL DATA:  Right groin pain to the knee for 1 week. No known injury.   EXAM: RIGHT KNEE - COMPLETE 4+ VIEW   COMPARISON:  None.   FINDINGS: No evidence of fracture, dislocation, or joint effusion. Osteophytosis is seen about the knee. Atherosclerosis noted. Soft tissues are unremarkable.   IMPRESSION: No acute abnormality.   Mild appearing osteoarthritis.   Atherosclerosis.     Electronically Signed   By: Inge Rise M.D.   On: 05/17/2018 19:02   CLINICAL DATA:  78 year old female with a right common carotid bruit on physical exam.   EXAM: BILATERAL CAROTID DUPLEX ULTRASOUND   TECHNIQUE: Pearline Cables scale imaging, color Doppler and duplex ultrasound were performed of bilateral carotid and vertebral arteries in the neck.   COMPARISON:  Prior carotid ultrasound 12/16/2009   FINDINGS: Criteria: Quantification of carotid stenosis is based on velocity parameters that correlate the residual internal carotid diameter with NASCET-based stenosis levels, using the diameter of the distal internal carotid lumen as the denominator for stenosis measurement.   The following velocity measurements were obtained:   RIGHT ICA: 92/22 cm/sec CCA: 09/8 cm/sec   SYSTOLIC ICA/CCA RATIO:  1.1   ECA:  95 cm/sec   LEFT   ICA: 137/35 cm/sec   CCA: 11/91  cm/sec   SYSTOLIC ICA/CCA RATIO:  2.5   ECA:  71 cm/sec   RIGHT CAROTID ARTERY: Modest heterogeneous and irregular atherosclerotic plaque throughout the distal common carotid artery extending through the carotid bifurcation into the proximal internal and external carotid arteries. By peak systolic velocity criteria, the estimated stenosis remains less than 50%.   RIGHT VERTEBRAL ARTERY:  Patent with normal antegrade flow.   LEFT CAROTID ARTERY: Heterogeneous and irregular atherosclerotic plaque in the distal common carotid artery and proximal internal carotid artery. By peak systolic velocity criteria, the internal carotid artery stenosis is within the 50-69% diameter range.   LEFT VERTEBRAL ARTERY:  Patent with normal antegrade flow.   IMPRESSION: 1. Mild (1-49%) stenosis proximal right internal carotid artery secondary to heterogeneous and irregular/ulcerated atherosclerotic plaque. 2. Moderate (50-69%) stenosis proximal left internal carotid artery secondary to heterogeneous and irregular/ulcerated atherosclerotic plaque. 3. The vertebral arteries are patent with normal antegrade flow.   Signed,   Criselda Peaches, MD, [facility]   [facility]     Electronically Signed   By: Jacqulynn Cadet M.D.   On: 05/27/2018 13:40 CLINICAL DATA:  Right hip and leg pain for 2 weeks. No known injury.   EXAM: CT OF THE RIGHT HIP WITHOUT CONTRAST   TECHNIQUE: Multidetector CT imaging of the right hip was performed according to the standard protocol. Multiplanar CT image reconstructions were also generated.   COMPARISON:  Plain films right hip this same day. CT abdomen and pelvis 01/03/2016.   FINDINGS: Bones/Joint/Cartilage   No acute bony or joint abnormality is identified. No lytic or sclerotic lesion is seen. No  avascular necrosis of the femoral head. No notable degenerative disease about the hip. There is  partial visualization of loss of disc space height and a disc bulge at L4-5.   Ligaments   Suboptimally assessed by CT.   Muscles and Tendons   Intact and normal in appearance.   Soft tissues   Atherosclerosis is noted.  The patient is status post hysterectomy.   IMPRESSION: Normal-appearing right hip.  No acute abnormality.   Partial visualization of a broad-based disc bulge at L4-5.   Atherosclerosis.     Electronically Signed   By: Inge Rise M.D.   On: 05/17/2018 20:08 CLINICAL DATA:  Hip pain   EXAM: LUMBAR SPINE - COMPLETE 4+ VIEW   COMPARISON:  CT 01/03/2016   FINDINGS: Surgical clips in the right upper quadrant. Five non rib-bearing lumbar type vertebra. Trace anterolisthesis L4 on L5. Vertebral body heights are maintained. Mild degenerative changes at L2-L3, L3-L4 and L4-L5. Aortic atherosclerosis.   IMPRESSION: Mild degenerative changes.  No acute osseous abnormality.     Electronically Signed   By: Donavan Foil M.D.   On: 05/17/2018 20:58   My independent reading of xrays:  Knee films show mild arthritis  CT scan of the hip was completely normal as was the x-ray of the hip    Encounter Diagnoses  Name Primary?  . Vascular insufficiency of extremity   . Spinal stenosis of lumbar region without neurogenic claudication Yes    PLAN: (Rx., injectx, surgery, frx, mri/ct) CT lumbar  ABI LEs Fu after studies   Meds ordered this encounter  Medications  . lidocaine (LIDODERM) 5 %    Sig: Place 1 patch onto the skin daily. Remove & Discard patch within 12 hours or as directed by MD    Dispense:  30 patch    Refill:  0    Arther Abbott, MD  06/26/2018 2:28 PM

## 2018-06-28 ENCOUNTER — Telehealth: Payer: Self-pay | Admitting: Radiology

## 2018-06-28 ENCOUNTER — Encounter: Payer: Self-pay | Admitting: Family Medicine

## 2018-06-28 ENCOUNTER — Other Ambulatory Visit: Payer: Self-pay | Admitting: Cardiovascular Disease

## 2018-06-28 ENCOUNTER — Ambulatory Visit (INDEPENDENT_AMBULATORY_CARE_PROVIDER_SITE_OTHER): Payer: Medicare HMO | Admitting: Family Medicine

## 2018-06-28 ENCOUNTER — Other Ambulatory Visit: Payer: Self-pay

## 2018-06-28 ENCOUNTER — Telehealth: Payer: Self-pay | Admitting: Family Medicine

## 2018-06-28 VITALS — BP 130/68 | HR 68 | Temp 98.1°F | Resp 14 | Ht 67.0 in | Wt 145.0 lb

## 2018-06-28 DIAGNOSIS — F330 Major depressive disorder, recurrent, mild: Secondary | ICD-10-CM

## 2018-06-28 DIAGNOSIS — N183 Chronic kidney disease, stage 3 unspecified: Secondary | ICD-10-CM

## 2018-06-28 DIAGNOSIS — I2510 Atherosclerotic heart disease of native coronary artery without angina pectoris: Secondary | ICD-10-CM

## 2018-06-28 DIAGNOSIS — M150 Primary generalized (osteo)arthritis: Secondary | ICD-10-CM

## 2018-06-28 DIAGNOSIS — E1159 Type 2 diabetes mellitus with other circulatory complications: Secondary | ICD-10-CM | POA: Diagnosis not present

## 2018-06-28 DIAGNOSIS — C3411 Malignant neoplasm of upper lobe, right bronchus or lung: Secondary | ICD-10-CM

## 2018-06-28 DIAGNOSIS — E46 Unspecified protein-calorie malnutrition: Secondary | ICD-10-CM

## 2018-06-28 DIAGNOSIS — M159 Polyosteoarthritis, unspecified: Secondary | ICD-10-CM

## 2018-06-28 DIAGNOSIS — J410 Simple chronic bronchitis: Secondary | ICD-10-CM

## 2018-06-28 DIAGNOSIS — I10 Essential (primary) hypertension: Secondary | ICD-10-CM | POA: Diagnosis not present

## 2018-06-28 DIAGNOSIS — M8949 Other hypertrophic osteoarthropathy, multiple sites: Secondary | ICD-10-CM

## 2018-06-28 MED ORDER — TIOTROPIUM BROMIDE MONOHYDRATE 1.25 MCG/ACT IN AERS: 2.0000 | INHALATION_SPRAY | Freq: Every day | RESPIRATORY_TRACT | 11 refills | Status: DC

## 2018-06-28 MED ORDER — HYDROCODONE-ACETAMINOPHEN 5-325 MG PO TABS: 1.0000 | ORAL_TABLET | Freq: Two times a day (BID) | ORAL | 0 refills | Status: DC | PRN

## 2018-06-28 NOTE — Progress Notes (Signed)
Subjective:    Patient ID: Charlotte Henry, female    DOB: 1940/07/04, 78 y.o.   MRN: [id number]  Patient presents for Leg Pain (is seeing Dr Aline Brochure)  Pt here to f/u medications. Continues to have weakness and right leg pain. She was referred to orthopedics at the last visit. She had imaging of hips which was benign, has CT of spine pending from ortho. Out of her hydrocodone. Using cane to get around but feels weaker.  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 but based on where cancer is located unable to biopsy or removal surgically  Has repeat CT scan chest in Feb as well   Breathing has been okay, she is out of spiriva but has albuterol and nebs on hand She has been more irritable per daughter, she is not sure if she is taking her lexapro but does take the xanax    Review Of Systems:  GEN- denies fatigue, fever, weight loss,weakness, recent illness HEENT- denies eye drainage, change in vision, nasal discharge, CVS- denies chest pain, palpitations RESP- denies SOB, cough, wheeze ABD- denies N/V, change in stools, abd pain GU- denies dysuria, hematuria, dribbling, incontinence MSK- + joint pain,denies  muscle aches, injury Neuro- denies headache, dizziness, syncope, seizure activity       Objective:    BP 130/68   Pulse 68   Temp 98.1 F (36.7 C) (Oral)   Resp 14   Ht 5\' 7"  (1.702 m)   Wt 145 lb (65.8 kg)   SpO2 98%   BMI 22.71 kg/m  GEN- NAD, alert and oriented x3 HEENT- PERRL, EOMI, non injected sclera, pink conjunctiva, MMM, oropharynx clear Neck- Supple, CVS- RRR, 2/6 systolic murmur RESP-CTAB ABD-NABS,soft,NT,ND MSK- decreased ROM upper and lower ext, , TTP along right groin to latearl aspect of hip, pain with IR/ER, pain with extension of RLE, she physically picks up leg to move Able to weight bear, walks with cane  EXT-no  edema Pulses- Radial 2+, DP diminished        Assessment & Plan:      Problem List Items Addressed This Visit      Unprioritized   Cardiovascular disease   Relevant Orders   Lipid panel (Completed)   Chronic kidney disease (CKD), stage III (moderate) (HCC)   COPD (chronic obstructive pulmonary disease) (HCC)    Refilled spriva and continue prn albuterol      Relevant Medications   Tiotropium Bromide Monohydrate (SPIRIVA RESPIMAT) 1.25 MCG/ACT AERS   Diabetes mellitus (HCC)    Check A1C, goal < 7% with no hypoglycemia symptoms      Relevant Orders   Hemoglobin A1c (Completed)   Lipid panel (Completed)   Essential hypertension - Primary    Well controlled      Relevant Orders   CBC with Differential/Platelet (Completed)   Comprehensive metabolic panel (Completed)   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HCC)    Followed by oncology, non surgical Surveillance at this time CT in feb      OA (osteoarthritis)    Known OA, ddd Ct OF LUMBAR SPINE PENDING REFILLED [facility]      Relevant Medications   HYDROcodone-acetaminophen ([place]) 5-325 MG tablet   Protein-calorie malnutrition (HCC)    Daughter to check her medications, ensure she is taking the lexapro for mood Continue protein supplements        Other Visit Diagnoses  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 Any transcriptional errors that result from this process are unintentional.

## 2018-06-28 NOTE — Telephone Encounter (Signed)
Pt has all meds except for lexapro needs it called in to [facility]

## 2018-06-28 NOTE — Telephone Encounter (Signed)
Left message for patient to call back, I have gotten approval for her CT and scheduled  She has also been sch for [place] same day 07/09/18   Arrival time for the [place] will be 215 for a 230 ABI, then she will have the Ct scan lumbar, the CT scan chest

## 2018-06-28 NOTE — Patient Instructions (Signed)
Pain medication refilled- hydrocodone We will call with lab  F/U 3 months

## 2018-06-28 NOTE — Telephone Encounter (Signed)
Charlotte Henry called back and states they have found the lexapro they no longer need this called in.

## 2018-06-29 LAB — COMPREHENSIVE METABOLIC PANEL
AG Ratio: 1.5 (calc) (ref 1.0–2.5)
ALT: 8 U/L (ref 6–29)
AST: 18 U/L (ref 10–35)
Albumin: 4 g/dL (ref 3.6–5.1)
Alkaline phosphatase (APISO): 49 U/L (ref 33–130)
BUN/Creatinine Ratio: 24 (calc) — ABNORMAL HIGH (ref 6–22)
BUN: 24 mg/dL (ref 7–25)
CO2: 26 mmol/L (ref 20–32)
Calcium: 10 mg/dL (ref 8.6–10.4)
Chloride: 109 mmol/L (ref 98–110)
Creat: 1.01 mg/dL — ABNORMAL HIGH (ref 0.60–0.93)
Globulin: 2.7 g/dL (calc) (ref 1.9–3.7)
Glucose, Bld: 86 mg/dL (ref 65–99)
Potassium: 4.9 mmol/L (ref 3.5–5.3)
Sodium: 144 mmol/L (ref 135–146)
TOTAL PROTEIN: 6.7 g/dL (ref 6.1–8.1)
Total Bilirubin: 0.5 mg/dL (ref 0.2–1.2)

## 2018-06-29 LAB — HEMOGLOBIN A1C
Hgb A1c MFr Bld: 5.8 % of total Hgb — ABNORMAL HIGH (ref <5.7)
Mean Plasma Glucose: 120 (calc)
eAG (mmol/L): 6.6 (calc)

## 2018-06-29 LAB — CBC WITH DIFFERENTIAL/PLATELET
ABSOLUTE MONOCYTES: 725 {cells}/uL (ref 200–950)
Basophils Absolute: 32 cells/uL (ref 0–200)
Basophils Relative: 0.5 %
Eosinophils Absolute: 69 cells/uL (ref 15–500)
Eosinophils Relative: 1.1 %
HCT: 33.5 % — ABNORMAL LOW (ref 35.0–45.0)
Hemoglobin: 11.2 g/dL — ABNORMAL LOW (ref 11.7–15.5)
Lymphs Abs: 2237 cells/uL (ref 850–3900)
MCH: 30.7 pg (ref 27.0–33.0)
MCHC: 33.4 g/dL (ref 32.0–36.0)
MCV: 91.8 fL (ref 80.0–100.0)
MPV: 9.8 fL (ref 7.5–12.5)
Monocytes Relative: 11.5 %
Neutro Abs: 3238 cells/uL (ref 1500–7800)
Neutrophils Relative %: 51.4 %
Platelets: 264 10*3/uL (ref 140–400)
RBC: 3.65 10*6/uL — ABNORMAL LOW (ref 3.80–5.10)
RDW: 13.1 % (ref 11.0–15.0)
Total Lymphocyte: 35.5 %
WBC: 6.3 10*3/uL (ref 3.8–10.8)

## 2018-06-29 LAB — LIPID PANEL
CHOLESTEROL: 168 mg/dL (ref <200)
HDL: 47 mg/dL — ABNORMAL LOW (ref >50)
LDL Cholesterol (Calc): 100 mg/dL (calc) — ABNORMAL HIGH
Non-HDL Cholesterol (Calc): 121 mg/dL (calc) (ref <130)
Total CHOL/HDL Ratio: 3.6 (calc) (ref <5.0)
Triglycerides: 112 mg/dL (ref <150)

## 2018-06-30 ENCOUNTER — Encounter: Payer: Self-pay | Admitting: Family Medicine

## 2018-06-30 NOTE — Assessment & Plan Note (Signed)
Daughter to check her medications, ensure she is taking the lexapro for mood Continue protein supplements

## 2018-06-30 NOTE — Assessment & Plan Note (Signed)
Known OA, ddd Ct OF LUMBAR SPINE PENDING REFILLED [facility]

## 2018-06-30 NOTE — Assessment & Plan Note (Signed)
Refilled spriva and continue prn albuterol

## 2018-06-30 NOTE — Assessment & Plan Note (Signed)
Followed by oncology, non surgical Surveillance at this time CT in feb

## 2018-06-30 NOTE — Assessment & Plan Note (Signed)
Well controlled 

## 2018-06-30 NOTE — Assessment & Plan Note (Signed)
Check A1C, goal < 7% with no hypoglycemia symptoms

## 2018-07-02 ENCOUNTER — Telehealth: Payer: Self-pay | Admitting: Radiology

## 2018-07-02 NOTE — Telephone Encounter (Signed)
Submitted prior auth request to [facility] via cover my meds for the Lidoderm generic patch

## 2018-07-02 NOTE — Telephone Encounter (Signed)
Called patient to advise Lidoderm patch not covered by her [employer] for her diagnosis, Dr Aline Brochure has advised she can try Salonpas  OTC with lidocaine, and this may help as well.   She voiced understanding.

## 2018-07-02 NOTE — Telephone Encounter (Signed)
Dear Prior Chiropractor, [employer] updated the outcome for this PA: Unfavorable Request Key: [id number] PA created on: 2018-06-26 16:50:42 -0500

## 2018-07-08 ENCOUNTER — Ambulatory Visit (INDEPENDENT_AMBULATORY_CARE_PROVIDER_SITE_OTHER): Payer: Medicare HMO

## 2018-07-08 DIAGNOSIS — I442 Atrioventricular block, complete: Secondary | ICD-10-CM

## 2018-07-08 DIAGNOSIS — I495 Sick sinus syndrome: Secondary | ICD-10-CM

## 2018-07-09 ENCOUNTER — Ambulatory Visit (HOSPITAL_COMMUNITY)
Admission: RE | Admit: 2018-07-09 | Discharge: 2018-07-09 | Disposition: A | Discharge status: Home / Self Care | Payer: Medicare HMO | Source: Ambulatory Visit | Attending: Orthopedic Surgery | Admitting: Orthopedic Surgery

## 2018-07-09 ENCOUNTER — Ambulatory Visit (HOSPITAL_COMMUNITY)
Admission: RE | Admit: 2018-07-09 | Discharge: 2018-07-09 | Disposition: A | Discharge status: Home / Self Care | Payer: Medicare HMO | Source: Ambulatory Visit | Attending: Internal Medicine | Admitting: Internal Medicine

## 2018-07-09 DIAGNOSIS — R918 Other nonspecific abnormal finding of lung field: Secondary | ICD-10-CM | POA: Insufficient documentation

## 2018-07-09 DIAGNOSIS — J449 Chronic obstructive pulmonary disease, unspecified: Secondary | ICD-10-CM | POA: Diagnosis not present

## 2018-07-09 DIAGNOSIS — M4807 Spinal stenosis, lumbosacral region: Secondary | ICD-10-CM | POA: Diagnosis not present

## 2018-07-09 DIAGNOSIS — I739 Peripheral vascular disease, unspecified: Secondary | ICD-10-CM | POA: Diagnosis not present

## 2018-07-09 DIAGNOSIS — M5126 Other intervertebral disc displacement, lumbar region: Secondary | ICD-10-CM | POA: Diagnosis not present

## 2018-07-09 DIAGNOSIS — M48061 Spinal stenosis, lumbar region without neurogenic claudication: Secondary | ICD-10-CM | POA: Insufficient documentation

## 2018-07-09 DIAGNOSIS — I998 Other disorder of circulatory system: Secondary | ICD-10-CM | POA: Diagnosis not present

## 2018-07-09 DIAGNOSIS — M5127 Other intervertebral disc displacement, lumbosacral region: Secondary | ICD-10-CM | POA: Diagnosis not present

## 2018-07-09 MED ORDER — IOHEXOL 300 MG/ML  SOLN
75.0000 mL | Freq: Once | INTRAMUSCULAR | Status: AC | PRN
  Administered 2018-07-09: 75 mL via INTRAVENOUS

## 2018-07-11 LAB — CUP PACEART REMOTE DEVICE CHECK
Battery Remaining Longevity: 108 mo
Battery Remaining Percentage: 95.5 %
Battery Voltage: 2.99 V
Brady Statistic AP VP Percent: 67 %
Brady Statistic AP VS Percent: 1 %
Brady Statistic AS VP Percent: 33 %
Brady Statistic AS VS Percent: 1 %
Brady Statistic RA Percent Paced: 67 %
Brady Statistic RV Percent Paced: 99 %
Date Time Interrogation Session: 20200203073807
Implantable Lead Implant Date: 20171020
Implantable Lead Implant Date: 20171020
Implantable Lead Location: 753859
Implantable Lead Location: 753860
Implantable Pulse Generator Implant Date: 20171020
Lead Channel Impedance Value: 410 Ohm
Lead Channel Impedance Value: 440 Ohm
Lead Channel Pacing Threshold Amplitude: 0.5 V
Lead Channel Pacing Threshold Amplitude: 0.75 V
Lead Channel Pacing Threshold Pulse Width: 0.4 ms
Lead Channel Sensing Intrinsic Amplitude: 1.6 mV
Lead Channel Setting Pacing Amplitude: 2 V
Lead Channel Setting Pacing Amplitude: 2.5 V
Lead Channel Setting Pacing Pulse Width: 0.4 ms
Lead Channel Setting Sensing Sensitivity: 3.5 mV
MDC IDC MSMT LEADCHNL RA PACING THRESHOLD PULSEWIDTH: 0.4 ms
MDC IDC MSMT LEADCHNL RV SENSING INTR AMPL: 8.4 mV
Pulse Gen Model: 2272
Pulse Gen Serial Number: 3180497

## 2018-07-17 ENCOUNTER — Other Ambulatory Visit: Payer: Self-pay

## 2018-07-17 ENCOUNTER — Encounter (HOSPITAL_COMMUNITY): Payer: Self-pay | Admitting: Internal Medicine

## 2018-07-17 ENCOUNTER — Inpatient Hospital Stay (HOSPITAL_COMMUNITY): Payer: Medicare HMO | Attending: Internal Medicine | Admitting: Internal Medicine

## 2018-07-17 VITALS — BP 122/59 | HR 60 | Temp 98.0°F | Resp 18 | Wt 142.3 lb

## 2018-07-17 DIAGNOSIS — C3411 Malignant neoplasm of upper lobe, right bronchus or lung: Secondary | ICD-10-CM

## 2018-07-17 DIAGNOSIS — R918 Other nonspecific abnormal finding of lung field: Secondary | ICD-10-CM

## 2018-07-17 DIAGNOSIS — D649 Anemia, unspecified: Secondary | ICD-10-CM

## 2018-07-17 DIAGNOSIS — D508 Other iron deficiency anemias: Secondary | ICD-10-CM

## 2018-07-17 NOTE — Progress Notes (Signed)
Diagnosis 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HCC) - Plan: CBC with Differential/Platelet, Comprehensive metabolic panel, Lactate dehydrogenase, Ferritin  Abnormal findings on diagnostic imaging of lung - Plan: CBC with Differential/Platelet, Comprehensive metabolic panel, Lactate dehydrogenase, Ferritin, CT CHEST W CONTRAST  Normocytic anemia  Other iron deficiency anemia - Plan: CBC with Differential/Platelet, Comprehensive metabolic panel, Lactate dehydrogenase, Ferritin  Staging Cancer Staging No matching staging information was found for the patient.  Assessment and Plan:  1.  IDA.  Pt was previously followed by Dr. Janyth Contes.  She was last treated with IV iron 12/2016.    Labs done 02/12/2018 reviewed and showed WBC 6 HB 11 plts 201,000.  Chemistries WNL with Cr 1, K+ 4.5 normal LFTS.  Ferritin 411. Pt had prior SPEP done 12/2016 that was negative. Pt will RTC 06/2018 with repeat labs.  She is advised to notify the office if any problems prior to her next visit.     2.  RUL Lung nodule suspicious for Lung cancer.  Pt was set up for CT of chest for lung cancer screening evaluation based on > 30 yr history of smoking.  CT of chest done 11/30/2017 showed  IMPRESSION: 11 mm irregular nodule in the medial right upper lobe, worrisome for possible primary bronchogenic neoplasm. Consider referral to multidisciplinary tumor board and possible PET-CT versus 3 month follow-up CT chest.  PET scan that was done 12/24/2017 was reviewed and showed  IMPRESSION: 10 mm hypermetabolic pulmonary nodule in the anterior and medial right upper lobe, highly suspicious for bronchogenic carcinoma.  No evidence of local or distant metastatic disease.  She has been seen by Dr. Dorris Fetch of CT surgery on 01/04/2018   His review is as follows:  "This is a difficult nodular biopsy due to its close proximity to the aorta.  This would be extremely difficult to access with navigational bronchoscopy.  I do  not think interventional radiology would be anxious to biopsy it either.   She does have adequate pulmonary reserve to tolerate a resection.  However I am not sure her overall condition makes her a candidate.  She has a very depressed affect, poor appetite, weight loss, limited mobility, generalized weakness, fatigue and extensive cardiovascular disease.  I am not sure she will be able to actively participate in her recovery from surgery.  Given the small peripheral nature of the lesion it likely could be treated with good results with stereotactic radiation.  [name]'s family does not think she is a good surgical candidate.  We discussed potential referral to radiation oncology and she and her family are in favor of that."  Pt was seen by RT Dr. Mitzi Hansen on 01/10/2018 and his review is as follows "The patient appears to be a good candidate for stereotactic body radiation treatment for the patient's diagnosis of lung cancer.  The location of the patient's tumor makes it very difficult to biopsy which would be a high risk procedure.  Nevertheless, this appears to represent an early stage non-small cell lung cancer.  I discussed the rationale of such a treatment with the patient including the possible benefits as well as side effects and risks. All of the patient's questions were answered and the patient wishes to proceed with this treatment. I would anticipate 3-5 fractions of stereotactic body radiation treatment to the lung. Staging -stage I non-small cell lung cancer "  Pt has completed RT.  CT of chest done 03/15/2018 reviewed and showed   IMPRESSION: 6 mm irregular nodule  in the medial right upper lobe, corresponding to the patient's known primary bronchogenic neoplasm, decreased status post radiation therapy.  No evidence of metastatic disease in the chest.  CT chest done 07/09/2018 reviewed and showed  IMPRESSION: 1. Continued reduction in size of RIGHT upper lobe nodule following radiation  therapy. Nodule is now poorly defined. 2. No new nodularity in the lungs.  No lymphadenopathy. 3. Aortic Atherosclerosis (ICD10-I70.0) and Emphysema (ICD10-J43.9).  Lung lesion now measures 4 mm ( previously 6 mm on scan from 03/2019)  Pt will be set up for CT chest in 10/2018.  She will follow-up at that time to go over scan and lab results.  Smoking cessation again recommended.    3.  Smoking.  Smoking cessation recommended especially due to abnormal lung findings.    4.  HTN.  BP is 122/59.   Follow-up with PCP.    25 minutes spent with more than 50% spent in review of records, counseling and coordination of care.    Current Status:  Pt is seen today for follow-up.  She is here to go over CT scans.  She continues to smoke.    Problem List Patient Active Problem List   Diagnosis Date Noted  . 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HCC) [C34.11] 01/10/2018  . Poor appetite [R63.0] 12/19/2017  . Itching [L29.9] 07/19/2017  . Gait instability [R26.81] 02/26/2017  . Symptomatic anemia [D64.9] 08/15/2016  . Protein-calorie malnutrition (HCC) [E46] 08/04/2016  . Helicobacter pylori gastritis [K29.70, B96.81] 07/26/2016  . Generalized weakness [R53.1] 07/09/2016  . Normocytic anemia [D64.9] 07/03/2016  . CHB (complete heart block) (HCC) [I44.2]   . COPD (chronic obstructive pulmonary disease) (HCC) [J44.9]   . Tobacco use disorder [F17.200] 10/15/2015  . Back pain [M54.9] 12/30/2014  . OA (osteoarthritis) [M19.90] 12/30/2014  . Carotid stenosis [I65.29] 03/11/2014  . PVD (peripheral vascular disease) (HCC) [I73.9] 03/11/2014  . Colon cancer screening [Z12.11] 02/04/2014  . Overactive bladder [N32.81] 12/31/2013  . Rash and nonspecific skin eruption [R21] 05/19/2013  . Numbness and tingling-bilat foot [R20.0, R20.2] 03/05/2013  . IBS (irritable bowel syndrome) [K58.9] 12/30/2012  . Peripheral neuropathy [G62.9] 07/01/2012  . Insomnia [G47.00] 07/01/2012  . Chronic kidney  disease (CKD), stage III (moderate) (HCC) [N18.3] 08/25/2010  . Cardiovascular disease [I25.10] 08/16/2009  . Cerebrovascular disease [I67.9] 08/16/2009  . Peripheral vascular disease (HCC) [I73.9] 08/16/2009  . Diabetes mellitus (HCC) [E11.9] 10/26/2008  . Hyperlipidemia [E78.5] 10/26/2008  . Gout, unspecified [M10.9] 10/26/2008  . Essential hypertension [I10] 10/26/2008  . Asthmatic bronchitis [J45.909] 10/26/2008  . Gastroesophageal reflux disease [K21.9] 10/26/2008    Past Medical History Past Medical History:  Diagnosis Date  . Arterial fibromuscular dysplasia (HCC)   . ASCVD (arteriosclerotic cardiovascular disease)    a. cath in 4/04,-80%LAD; 70% RCA, -> DESx2; residual 60% distal RCA; nl EF  b. Nuc 02/2016 aborted due to bradycardia. Will need to be rescheduled   . Carotid artery occlusion   . Cerebrovascular disease   . CHB (complete heart block) (HCC)    a. s/p PPM on 03/24/16 with a St. Jude (serial number [id number]) pacemaker  . Chronic kidney disease (CKD), stage III (moderate) (HCC)   . Clostridium difficile colitis 03/2013   a. 03/2013  . COPD (chronic obstructive pulmonary disease) (HCC)   . Coronary disease   . Diverticulosis   . GERD (gastroesophageal reflux disease)   . Gout   . H pylori ulcer   . Hyperlipidemia    chose to  stop statin, takes Zetia  . Hypertension    not currently on medication  . IBS (irritable bowel syndrome)   . PVD (peripheral vascular disease) (HCC)    a. moderate to severely decreased ABI's in 8/08, sig. aortic inflow disease b. repeat ABI's in 2011 improved- mild disease on the left and moderate to severe disease on the right  . S/P placement of cardiac pacemaker    a. 03/24/16: St. Jude (serial number [id number]) pacemaker  . Symptomatic anemia 08/15/2016  . TIA (transient ischemic attack) 2012  . Tobacco abuse    a. 50 pack years continuing at 1/2 pack per day  . Tobacco use disorder 10/15/2015    Past Surgical History Past Surgical  History:  Procedure Laterality Date  . ABDOMINAL HYSTERECTOMY    . carpel tunnel release     bilateral  . CHOLECYSTECTOMY    . COLONOSCOPY  2009  . COLONOSCOPY N/A 08/04/2016   incomplete due to prep.   . COLONOSCOPY N/A 08/05/2016   Dr. Darrick Penna: moderately redundant rectosigmoid and sigmoid, normal TI, single large-mouthed diverticulum in hepatic flexure, external hemorrhoids, path negative for microscopic colitis   . ENTEROSCOPY N/A 08/18/2016   Procedure: ENTEROSCOPY;  Surgeon: Corbin Ade, MD;  Location: [facility];  Service: Endoscopy;  Laterality: N/A;  Pediatric colonoscope  . EP IMPLANTABLE DEVICE N/A 03/24/2016   Procedure: Pacemaker Implant;  Surgeon: Marinus Maw, MD;  Location: [facility] INVASIVE CV LAB;  Service: Cardiovascular;  Laterality: N/A;  . ESOPHAGOGASTRODUODENOSCOPY N/A 08/04/2016   Dr. Darrick Penna: non-critical Schatzki's ring, small hiatal hernia, +H.pylori gastritis on path.   Marland Kitchen GIVENS CAPSULE STUDY N/A 08/16/2016   Procedure: GIVENS CAPSULE STUDY;  Surgeon: Corbin Ade, MD;  Location: [facility];  Service: Endoscopy;  Laterality: N/A;  . TOTAL ABDOMINAL HYSTERECTOMY W/ BILATERAL SALPINGOOPHORECTOMY  1979    Family History Family History  Problem Relation Age of Onset  . Liver disease Mother 30  . Hypertension Mother   . Cerebral aneurysm Father   . Aneurysm Father 39  . Cancer Sister   . Liver disease Brother   . Liver disease Brother   . Liver disease Brother   . Diabetes type II Brother   . Alcohol abuse Brother   . Colon cancer Neg Hx      Social History  reports that she has been smoking cigarettes. She started smoking about 52 years ago. She has a 50.00 pack-year smoking history. She has never used smokeless tobacco. She reports that she does not drink alcohol or use drugs.  Medications  Current Outpatient Medications:  .  acetaminophen (TYLENOL) 500 MG tablet, Take 500 mg by mouth every 6 (six) hours as needed for mild pain or moderate pain.,  Disp: , Rfl:  .  albuterol (PROVENTIL HFA;VENTOLIN HFA) 108 (90 Base) MCG/ACT inhaler, Inhale 2 puffs into the lungs every 4 (four) hours as needed for wheezing or shortness of breath., Disp: 1 Inhaler, Rfl: 2 .  ALPRAZolam (XANAX) 0.25 MG tablet, Take 1 tablet (0.25 mg total) by mouth at bedtime as needed., Disp: 30 tablet, Rfl: 1 .  atorvastatin (LIPITOR) 10 MG tablet, Take 1 tablet (10 mg total) by mouth 3 (three) times a week., Disp: 30 tablet, Rfl: 3 .  calcium citrate-vitamin D (CITRACAL+D) 315-200 MG-UNIT per tablet, Take 1 tablet by mouth daily.  , Disp: , Rfl:  .  Cranberry 250 MG CAPS, Take 250 mg by mouth daily., Disp: , Rfl:  .  Cyanocobalamin (VITAMIN  B 12 PO), Take 1 tablet by mouth daily. , Disp: , Rfl:  .  escitalopram (LEXAPRO) 10 MG tablet, Take 1 tablet (10 mg total) by mouth daily., Disp: 30 tablet, Rfl: 5 .  esomeprazole (NEXIUM) 40 MG capsule, TAKE 1 CAPSULE BY MOUTH DAILY BEFORE BREAKFAST., Disp: 30 capsule, Rfl: 11 .  ezetimibe (ZETIA) 10 MG tablet, TAKE 1 TABLET BY MOUTH ONCE A DAY., Disp: 30 tablet, Rfl: 0 .  ferrous sulfate 325 (65 FE) MG tablet, Take 325 mg by mouth daily with breakfast., Disp: , Rfl:  .  gabapentin (NEURONTIN) 100 MG capsule, TAKE 1 TO 2 CAPSULES BY MOUTH AT BEDTIME FOR NERVE PAIN., Disp: 60 capsule, Rfl: 6 .  HYDROcodone-acetaminophen (NORCO) 5-325 MG tablet, Take 1 tablet by mouth 2 (two) times daily as needed for moderate pain., Disp: 30 tablet, Rfl: 0 .  hydrOXYzine (ATARAX/VISTARIL) 25 MG tablet, 1 PO Q8H PRN FOR ITCHING., Disp: 45 tablet, Rfl: 0 .  lidocaine (LIDODERM) 5 %, Place 1 patch onto the skin daily. Remove & Discard patch within 12 hours or as directed by MD, Disp: 30 patch, Rfl: 0 .  lubiprostone (AMITIZA) 8 MCG capsule, Take 8 mcg by mouth as needed. , Disp: , Rfl:  .  mirabegron ER (MYRBETRIQ) 25 MG TB24 tablet, Take 1 tablet (25 mg total) by mouth at bedtime. For bladder, Disp: 30 tablet, Rfl: 6 .  Multiple Vitamin (MULTIVITAMIN WITH  MINERALS) TABS tablet, Take 1 tablet by mouth daily., Disp: , Rfl:  .  Probiotic Product (PROBIOTIC DAILY PO), Take 1 capsule by mouth daily. , Disp: , Rfl:  .  Tiotropium Bromide Monohydrate (SPIRIVA RESPIMAT) 1.25 MCG/ACT AERS, Inhale 2 puffs into the lungs daily., Disp: 1 Inhaler, Rfl: 11  Allergies Ace inhibitors; Aspirin; Codeine; Pineapple; Plasticized base [plastibase]; Ultram [tramadol]; Adhesive [tape]; Naprosyn [naproxen]; Biaxin [clarithromycin]; Clindamycin/lincomycin; Linzess [linaclotide]; Metronidazole; Nsaids; Penicillins; Varenicline tartrate; and Xarelto [rivaroxaban]  Review of Systems Review of Systems - Oncology ROS negative   Physical Exam  Vitals Wt Readings from Last 3 Encounters:  07/17/18 142 lb 4.8 oz (64.5 kg)  06/28/18 145 lb (65.8 kg)  06/26/18 145 lb (65.8 kg)   Temp Readings from Last 3 Encounters:  07/17/18 98 F (36.7 C) (Oral)  06/28/18 98.1 F (36.7 C) (Oral)  05/20/18 98.4 F (36.9 C) (Oral)   BP Readings from Last 3 Encounters:  07/17/18 (!) 122/59  06/28/18 130/68  06/26/18 133/78   Pulse Readings from Last 3 Encounters:  07/17/18 60  06/28/18 68  06/26/18 60   Constitutional: Well-developed, well-nourished, and in no distress.   HENT: Head: Normocephalic and atraumatic.  Mouth/Throat: No oropharyngeal exudate. Mucosa moist. Eyes: Pupils are equal, round, and reactive to light. Conjunctivae are normal. No scleral icterus.  Neck: Normal range of motion. Neck supple. No JVD present.  Cardiovascular: Normal rate, regular rhythm and normal heart sounds.  Exam reveals no gallop and no friction rub.   No murmur heard. Pulmonary/Chest: Effort normal and breath sounds normal. No respiratory distress. No wheezes.No rales.  Abdominal: Soft. Bowel sounds are normal. No distension. There is no tenderness. There is no guarding.  Musculoskeletal: No edema or tenderness.  Lymphadenopathy: No cervical, axillary or supraclavicular adenopathy.   Neurological: Alert and oriented to person, place, and time. No cranial nerve deficit.  Skin: Skin is warm and dry. No rash noted. No erythema. No pallor.  Psychiatric: Affect and judgment normal.   Labs No visits with results within 3 Day(s) from this visit.  Latest known visit with results is:  Clinical Support on 07/08/2018  Component Date Value Ref Range Status  . Date Time Interrogation Session 07/08/2018 [id number]   Final  . Pulse Generator Manufacturer 07/08/2018 [facility]   Final  . Pulse Gen Model 07/08/2018 2272 [employer] MRI   Final  . Pulse Gen Serial Number 07/08/2018 [id number]   Final  . Clinic Name 07/08/2018 [facility]   Final  . Implantable Pulse Generator Type 07/08/2018 Implantable Pulse Generator   Final  . Implantable Pulse Generator Implan* 07/08/2018 [id number]   Final  . Implantable Lead Manufacturer 07/08/2018 [facility]   Final  . Implantable Lead Model 07/08/2018 Tendril MRI [id number]   Final  . Implantable Lead Serial Number 07/08/2018 [id number]   Final  . Implantable Lead Implant Date 07/08/2018 [id number]   Final  . Implantable Lead Location Detail 1 07/08/2018 UNKNOWN   Final  . Implantable Lead Location 07/08/2018 [id number]   Final  . Implantable Lead Manufacturer 07/08/2018 [facility]   Final  . Implantable Lead Model 07/08/2018 Tendril MRI [id number]   Final  . Implantable Lead Serial Number 07/08/2018 [id number]   Final  . Implantable Lead Implant Date 07/08/2018 [id number]   Final  . Implantable Lead Location Detail 1 07/08/2018 APEX   Final  . Implantable Lead Location 07/08/2018 [id number]   Final  . Lead Channel Setting Sensing Sensi* 07/08/2018 3.5  mV Final  . Lead Channel Setting Sensing Adapt* 07/08/2018 Fixed Pacing   Final  . Lead Channel Setting Pacing Amplit* 07/08/2018 2.0  V Final  . Lead Channel Setting Pacing Pulse * 07/08/2018 0.4  ms Final  . Lead Channel Setting Pacing Amplit* 07/08/2018 2.5  V Final  . Lead Channel Status 07/08/2018 NULL   Final  .  Lead Channel Impedance Value 07/08/2018 410  ohm Final  . Lead Channel Sensing Intrinsic Amp* 07/08/2018 1.6  mV Final  . Lead Channel Pacing Threshold Ampl* 07/08/2018 0.5  V Final  . Lead Channel Pacing Threshold Puls* 07/08/2018 0.4  ms Final  . Lead Channel Status 07/08/2018 NULL   Final  . Lead Channel Impedance Value 07/08/2018 440  ohm Final  . Lead Channel Sensing Intrinsic Amp* 07/08/2018 8.4  mV Final  . Lead Channel Pacing Threshold Ampl* 07/08/2018 0.75  V Final  . Lead Channel Pacing Threshold Puls* 07/08/2018 0.4  ms Final  . Battery Status 07/08/2018 MOS   Final  . Battery Remaining Longevity 07/08/2018 108  mo Final  . Battery Remaining Percentage 07/08/2018 95.5  % Final  . Battery Voltage 07/08/2018 2.99  V Final  . Brady Statistic RA Percent Paced 07/08/2018 67.0  % Final  . Brady Statistic RV Percent Paced 07/08/2018 99.0  % Final  . Brady Statistic AP VP Percent 07/08/2018 67.0  % Final  . Brady Statistic AS VP Percent 07/08/2018 33.0  % Final  . Brady Statistic AP VS Percent 07/08/2018 1.0  % Final  . [place]on Foley Statistic AS VS Percent 07/08/2018 1.0  % Final     Pathology Orders Placed This Encounter  Procedures  . CT CHEST W CONTRAST    Standing Status:   Future    Standing Expiration Date:   07/17/2019    Order Specific Question:   If indicated for the ordered procedure, I authorize the administration of contrast media per Radiology protocol    Answer:   Yes    Order Specific Question:   Preferred imaging location?    Answer:   [facility]  Order Specific Question:   Radiology Contrast Protocol - do NOT remove file path    Answer:   [email or web address]  . CBC with Differential/Platelet    Standing Status:   Future    Standing Expiration Date:   07/18/2019  . Comprehensive metabolic panel    Standing Status:   Future    Standing Expiration Date:   07/18/2019  . Lactate dehydrogenase    Standing Status:   Future    Standing  Expiration Date:   07/18/2019  . Ferritin    Standing Status:   Future    Standing Expiration Date:   07/18/2019       Ahmed Prima MD

## 2018-07-17 NOTE — Progress Notes (Signed)
Remote pacemaker transmission.   

## 2018-07-17 NOTE — Patient Instructions (Signed)
[place] at [facility] Discharge Instructions  You were seen by Dr. Walden Field today   Thank you for choosing [place] at [facility] to provide your oncology and hematology care.  To afford each patient quality time with our provider, please arrive at least 15 minutes before your scheduled appointment time.   If you have a lab appointment with the [facility] please come in thru the  Main Entrance and check in at the main information desk  You need to re-schedule your appointment should you arrive 10 or more minutes late.  We strive to give you quality time with our providers, and arriving late affects you and other patients whose appointments are after yours.  Also, if you no show three or more times for appointments you may be dismissed from the clinic at the providers discretion.     Again, thank you for choosing [facility].  Our hope is that these requests will decrease the amount of time that you wait before being seen by our physicians.       _____________________________________________________________  Should you have questions after your visit to [facility], please contact our office at [phone] between the hours of 8:00 a.m. and 4:30 p.m.  Voicemails left after 4:00 p.m. will not be returned until the following business day.  For prescription refill requests, have your pharmacy contact our office and allow 72 hours.    Cancer Center Support Programs:   > Cancer Support Group  2nd Tuesday of the month 1pm-2pm, Journey Room

## 2018-07-24 ENCOUNTER — Telehealth: Payer: Self-pay | Admitting: Family Medicine

## 2018-07-24 ENCOUNTER — Ambulatory Visit: Payer: Medicare HMO | Admitting: Vascular Surgery

## 2018-07-24 ENCOUNTER — Encounter: Payer: Self-pay | Admitting: Family

## 2018-07-24 NOTE — Telephone Encounter (Signed)
Patient would like a call from you, she would not go into detail why  [phone]

## 2018-07-24 NOTE — Telephone Encounter (Signed)
Returned call to patient.   Inquired as to if [facility] had samples of Ensure.   Case left for patient daughter to pick up at front desk.

## 2018-07-29 ENCOUNTER — Other Ambulatory Visit: Payer: Self-pay | Admitting: Family Medicine

## 2018-09-27 ENCOUNTER — Ambulatory Visit: Payer: Medicare HMO | Admitting: Family Medicine

## 2018-10-14 ENCOUNTER — Other Ambulatory Visit: Payer: Self-pay

## 2018-10-14 ENCOUNTER — Ambulatory Visit: Payer: Medicare HMO | Admitting: *Deleted

## 2018-10-14 DIAGNOSIS — I442 Atrioventricular block, complete: Secondary | ICD-10-CM

## 2018-10-14 DIAGNOSIS — I495 Sick sinus syndrome: Secondary | ICD-10-CM

## 2018-10-15 LAB — CUP PACEART REMOTE DEVICE CHECK
Date Time Interrogation Session: 20200512101230
Implantable Lead Implant Date: 20171020
Implantable Lead Implant Date: 20171020
Implantable Lead Location: 753859
Implantable Lead Location: 753860
Implantable Pulse Generator Implant Date: 20171020
Pulse Gen Model: 2272
Pulse Gen Serial Number: 3180497

## 2018-10-25 NOTE — Progress Notes (Signed)
Remote pacemaker transmission.   

## 2018-11-01 ENCOUNTER — Other Ambulatory Visit (HOSPITAL_COMMUNITY): Payer: Self-pay | Admitting: *Deleted

## 2018-11-01 DIAGNOSIS — D508 Other iron deficiency anemias: Secondary | ICD-10-CM

## 2018-11-01 DIAGNOSIS — C3411 Malignant neoplasm of upper lobe, right bronchus or lung: Secondary | ICD-10-CM

## 2018-11-01 DIAGNOSIS — R918 Other nonspecific abnormal finding of lung field: Secondary | ICD-10-CM

## 2018-11-01 DIAGNOSIS — D649 Anemia, unspecified: Secondary | ICD-10-CM

## 2018-11-02 ENCOUNTER — Other Ambulatory Visit: Payer: Self-pay | Admitting: Family Medicine

## 2018-11-04 ENCOUNTER — Ambulatory Visit (HOSPITAL_COMMUNITY): Payer: Medicare HMO

## 2018-11-04 ENCOUNTER — Other Ambulatory Visit (HOSPITAL_COMMUNITY): Payer: Medicare HMO

## 2018-11-04 NOTE — Telephone Encounter (Signed)
Last office visit: 06/28/2018 Last filled: 12/12/2017

## 2018-11-07 ENCOUNTER — Ambulatory Visit (HOSPITAL_COMMUNITY): Payer: Medicare HMO | Admitting: Hematology

## 2018-11-12 ENCOUNTER — Ambulatory Visit: Payer: Medicare HMO | Admitting: Cardiovascular Disease

## 2018-11-13 ENCOUNTER — Inpatient Hospital Stay (HOSPITAL_COMMUNITY): Payer: Medicare HMO | Attending: Hematology

## 2018-11-13 ENCOUNTER — Other Ambulatory Visit: Payer: Self-pay

## 2018-11-13 DIAGNOSIS — Z720 Tobacco use: Secondary | ICD-10-CM | POA: Insufficient documentation

## 2018-11-13 DIAGNOSIS — R911 Solitary pulmonary nodule: Secondary | ICD-10-CM | POA: Insufficient documentation

## 2018-11-13 DIAGNOSIS — N189 Chronic kidney disease, unspecified: Secondary | ICD-10-CM | POA: Diagnosis not present

## 2018-11-13 DIAGNOSIS — R200 Anesthesia of skin: Secondary | ICD-10-CM | POA: Insufficient documentation

## 2018-11-13 DIAGNOSIS — R05 Cough: Secondary | ICD-10-CM | POA: Diagnosis not present

## 2018-11-13 DIAGNOSIS — J449 Chronic obstructive pulmonary disease, unspecified: Secondary | ICD-10-CM | POA: Diagnosis not present

## 2018-11-13 DIAGNOSIS — R918 Other nonspecific abnormal finding of lung field: Secondary | ICD-10-CM

## 2018-11-13 DIAGNOSIS — D649 Anemia, unspecified: Secondary | ICD-10-CM | POA: Insufficient documentation

## 2018-11-13 DIAGNOSIS — D508 Other iron deficiency anemias: Secondary | ICD-10-CM

## 2018-11-13 DIAGNOSIS — C3411 Malignant neoplasm of upper lobe, right bronchus or lung: Secondary | ICD-10-CM

## 2018-11-13 LAB — COMPREHENSIVE METABOLIC PANEL
ALT: 11 U/L (ref 0–44)
AST: 21 U/L (ref 15–41)
Albumin: 3.9 g/dL (ref 3.5–5.0)
Alkaline Phosphatase: 50 U/L (ref 38–126)
Anion gap: 8 (ref 5–15)
BUN: 19 mg/dL (ref 8–23)
CO2: 26 mmol/L (ref 22–32)
Calcium: 9.4 mg/dL (ref 8.9–10.3)
Chloride: 107 mmol/L (ref 98–111)
Creatinine, Ser: 1.01 mg/dL — ABNORMAL HIGH (ref 0.44–1.00)
GFR calc Af Amer: >60 mL/min (ref >60)
GFR calc non Af Amer: 53 mL/min — ABNORMAL LOW (ref >60)
Glucose, Bld: 96 mg/dL (ref 70–99)
Potassium: 4.3 mmol/L (ref 3.5–5.1)
Sodium: 141 mmol/L (ref 135–145)
Total Bilirubin: 0.6 mg/dL (ref 0.3–1.2)
Total Protein: 6.9 g/dL (ref 6.5–8.1)

## 2018-11-13 LAB — CBC WITH DIFFERENTIAL/PLATELET
Abs Immature Granulocytes: 0.02 10*3/uL (ref 0.00–0.07)
Basophils Absolute: 0 10*3/uL (ref 0.0–0.1)
Basophils Relative: 1 %
Eosinophils Absolute: 0.1 10*3/uL (ref 0.0–0.5)
Eosinophils Relative: 2 %
HCT: 33.6 % — ABNORMAL LOW (ref 36.0–46.0)
Hemoglobin: 10.8 g/dL — ABNORMAL LOW (ref 12.0–15.0)
Immature Granulocytes: 0 %
Lymphocytes Relative: 30 %
Lymphs Abs: 1.9 10*3/uL (ref 0.7–4.0)
MCH: 29.6 pg (ref 26.0–34.0)
MCHC: 32.1 g/dL (ref 30.0–36.0)
MCV: 92.1 fL (ref 80.0–100.0)
Monocytes Absolute: 0.6 10*3/uL (ref 0.1–1.0)
Monocytes Relative: 9 %
Neutro Abs: 3.7 10*3/uL (ref 1.7–7.7)
Neutrophils Relative %: 58 %
Platelets: 193 10*3/uL (ref 150–400)
RBC: 3.65 MIL/uL — ABNORMAL LOW (ref 3.87–5.11)
RDW: 13.6 % (ref 11.5–15.5)
WBC: 6.3 10*3/uL (ref 4.0–10.5)
nRBC: 0 % (ref 0.0–0.2)

## 2018-11-13 LAB — FERRITIN: Ferritin: 304 ng/mL (ref 11–307)

## 2018-11-13 LAB — LACTATE DEHYDROGENASE: LDH: 125 U/L (ref 98–192)

## 2018-11-14 ENCOUNTER — Ambulatory Visit (HOSPITAL_COMMUNITY)
Admission: RE | Admit: 2018-11-14 | Discharge: 2018-11-14 | Disposition: A | Discharge status: Home / Self Care | Payer: Medicare HMO | Source: Ambulatory Visit | Attending: Internal Medicine | Admitting: Internal Medicine

## 2018-11-14 DIAGNOSIS — R918 Other nonspecific abnormal finding of lung field: Secondary | ICD-10-CM | POA: Insufficient documentation

## 2018-11-14 DIAGNOSIS — R911 Solitary pulmonary nodule: Secondary | ICD-10-CM | POA: Diagnosis not present

## 2018-11-14 DIAGNOSIS — J439 Emphysema, unspecified: Secondary | ICD-10-CM | POA: Diagnosis not present

## 2018-11-14 DIAGNOSIS — I700 Atherosclerosis of aorta: Secondary | ICD-10-CM | POA: Diagnosis not present

## 2018-11-14 MED ORDER — IOHEXOL 300 MG/ML  SOLN
75.0000 mL | Freq: Once | INTRAMUSCULAR | Status: AC | PRN
  Administered 2018-11-14: 19:00:00 75 mL via INTRAVENOUS

## 2018-11-15 NOTE — Progress Notes (Signed)
For review.  Please update ordering provider

## 2018-11-18 ENCOUNTER — Other Ambulatory Visit: Payer: Self-pay

## 2018-11-19 ENCOUNTER — Inpatient Hospital Stay (HOSPITAL_BASED_OUTPATIENT_CLINIC_OR_DEPARTMENT_OTHER): Payer: Medicare HMO | Admitting: Hematology

## 2018-11-19 ENCOUNTER — Encounter (HOSPITAL_COMMUNITY): Payer: Self-pay | Admitting: Hematology

## 2018-11-19 DIAGNOSIS — Z720 Tobacco use: Secondary | ICD-10-CM

## 2018-11-19 DIAGNOSIS — D649 Anemia, unspecified: Secondary | ICD-10-CM | POA: Diagnosis not present

## 2018-11-19 DIAGNOSIS — R200 Anesthesia of skin: Secondary | ICD-10-CM

## 2018-11-19 DIAGNOSIS — N189 Chronic kidney disease, unspecified: Secondary | ICD-10-CM | POA: Diagnosis not present

## 2018-11-19 DIAGNOSIS — J449 Chronic obstructive pulmonary disease, unspecified: Secondary | ICD-10-CM

## 2018-11-19 DIAGNOSIS — R05 Cough: Secondary | ICD-10-CM | POA: Diagnosis not present

## 2018-11-19 DIAGNOSIS — R911 Solitary pulmonary nodule: Secondary | ICD-10-CM | POA: Diagnosis not present

## 2018-11-19 NOTE — Progress Notes (Signed)
[place], [place] [address]   CLINIC:  Medical Oncology/Hematology  PCP:  Alycia Rossetti, MD [address] [place]  [id number] [phone]   REASON FOR VISIT:  Follow-up for iron deficiency anemia and right upper lobe lung cancer.     INTERVAL HISTORY:  [name] 78 y.o. female returns for follow-up of anemia and right upper lobe lung cancer.  She denies any bleeding per rectum or melena.  She is taking iron tablet daily without any major problems.  She has cough early in the morning from COPD.  Denies any hemoptysis.  Denies any chest pains.  Smoking about 5 cigarettes/day.  Numbness in the feet has been stable.  She is eating 2 meals per day and drinks about 1 can of Ensure for breakfast.  Denies any fevers, night sweats or weight loss.    REVIEW OF SYSTEMS:  Review of Systems  Respiratory: Positive for cough.   Neurological: Positive for numbness.     PAST MEDICAL/SURGICAL HISTORY:  Past Medical History:  Diagnosis Date  . Arterial fibromuscular dysplasia ([facility])   . ASCVD (arteriosclerotic cardiovascular disease)    a. cath in 4/04,-80%LAD; 70% RCA, -> DESx2; residual 60% distal RCA; nl EF  b. Nuc 02/2016 aborted due to bradycardia. Will need to be rescheduled   . Carotid artery occlusion   . Cerebrovascular disease   . CHB (complete heart block) ([facility])    a. s/p PPM on 03/24/16 with a St. Jude (serial number [id number]) pacemaker  . Chronic kidney disease (CKD), stage III (moderate) (H[facility])   . Clostridium difficile colitis 03/2013   a. 03/2013  . COPD (chronic obstructive pulmonary disease) ([facility])   . Coronary disease   . Diverticulosis   . GERD (gastroesophageal reflux disease)   . Gout   . H pylori ulcer   . Hyperlipidemia    chose to stop statin, takes Zetia  . Hypertension    not currently on medication  . IBS (irritable bowel syndrome)   . PVD (peripheral vascular disease) ([facility])    a. moderate to severely  decreased ABI's in 8/08, sig. aortic inflow disease b. repeat ABI's in 2011 improved- mild disease on the left and moderate to severe disease on the right  . S/P placement of cardiac pacemaker    a. 03/24/16: St. Jude (serial number [id number]) pacemaker  . Symptomatic anemia 08/15/2016  . TIA (transient ischemic attack) 2012  . Tobacco abuse    a. 50 pack years continuing at 1/2 pack per day  . Tobacco use disorder 10/15/2015   Past Surgical History:  Procedure Laterality Date  . ABDOMINAL HYSTERECTOMY    . carpel tunnel release     bilateral  . CHOLECYSTECTOMY    . COLONOSCOPY  2009  . COLONOSCOPY N/A 08/04/2016   incomplete due to prep.   . COLONOSCOPY N/A 08/05/2016   Dr. Oneida Alar: moderately redundant rectosigmoid and sigmoid, normal TI, single large-mouthed diverticulum in hepatic flexure, external hemorrhoids, path negative for microscopic colitis   . ENTEROSCOPY N/A 08/18/2016   Procedure: ENTEROSCOPY;  Surgeon: Daneil Dolin, MD;  Location: [facility];  Service: Endoscopy;  Laterality: N/A;  Pediatric colonoscope  . EP IMPLANTABLE DEVICE N/A 03/24/2016   Procedure: Pacemaker Implant;  Surgeon: [name], MD;  Location: [facility];  Service: Cardiovascular;  Laterality: N/A;  . ESOPHAGOGASTRODUODENOSCOPY N/A 08/04/2016   Dr. Oneida Alar: non-critical Schatzki's ring, small hiatal hernia, +H.pylori gastritis on  path.   Marland Kitchen GIVENS CAPSULE STUDY N/A 08/16/2016   Procedure: GIVENS CAPSULE STUDY;  Surgeon: Daneil Dolin, MD;  Location: [facility];  Service: Endoscopy;  Laterality: N/A;  . TOTAL ABDOMINAL HYSTERECTOMY W/ BILATERAL SALPINGOOPHORECTOMY  1979     SOCIAL HISTORY:  Social History   Socioeconomic History  . Marital status: Widowed    Spouse name: Not on file  . Number of children: Not on file  . Years of education: Not on file  . Highest education level: Not on file  Occupational History  . Not on file  Social Needs  . Financial resource strain: Not on file  .  Food insecurity    Worry: Not on file    Inability: Not on file  . Transportation needs    Medical: Not on file    Non-medical: Not on file  Tobacco Use  . Smoking status: Current Every Day Smoker    Packs/day: 1.00    Years: 50.00    Pack years: 50.00    Types: Cigarettes    Start date: 02/02/1966  . Smokeless tobacco: Never Used  . Tobacco comment: 1/2 pack daily  Substance and Sexual Activity  . Alcohol use: No    Alcohol/week: 0.0 standard drinks    Comment: H/O of 6 pack per day quiting in 2003  . Drug use: No  . Sexual activity: Not Currently  Lifestyle  . Physical activity    Days per week: Not on file    Minutes per session: Not on file  . Stress: Not on file  Relationships  . Social Herbalist on phone: Not on file    Gets together: Not on file    Attends religious service: Not on file    Active member of club or organization: Not on file    Attends meetings of clubs or organizations: Not on file    Relationship status: Not on file  . Intimate partner violence    Fear of current or ex partner: No    Emotionally abused: No    Physically abused: Not on file    Forced sexual activity: No  Other Topics Concern  . Not on file  Social History Narrative  . Not on file    FAMILY HISTORY:  Family History  Problem Relation Age of Onset  . Liver disease Mother 28  . Hypertension Mother   . Cerebral aneurysm Father   . Aneurysm Father 67  . Cancer Sister   . Liver disease Brother   . Liver disease Brother   . Liver disease Brother   . Diabetes type II Brother   . Alcohol abuse Brother   . Colon cancer Neg Hx     CURRENT MEDICATIONS:  Outpatient Encounter Medications as of 11/19/2018  Medication Sig  . acetaminophen (TYLENOL) 500 MG tablet Take 500 mg by mouth every 6 (six) hours as needed for mild pain or moderate pain.  Marland Kitchen albuterol (PROVENTIL HFA;VENTOLIN HFA) 108 (90 Base) MCG/ACT inhaler Inhale 2 puffs into the lungs every 4 (four) hours as  needed for wheezing or shortness of breath.  . ALPRAZolam (XANAX) 0.25 MG tablet TAKE 1 TABLET BY MOUTH AT BEDTIME AS NEEDED.  Marland Kitchen atorvastatin (LIPITOR) 10 MG tablet Take 1 tablet (10 mg total) by mouth 3 (three) times a week.  . calcium citrate-vitamin D (CITRACAL+D) 315-200 MG-UNIT per tablet Take 1 tablet by mouth daily.    . Cranberry 250 MG CAPS Take 250 mg by  mouth daily.  . Cyanocobalamin (VITAMIN B 12 PO) Take 1 tablet by mouth daily.   Marland Kitchen escitalopram (LEXAPRO) 10 MG tablet Take 1 tablet (10 mg total) by mouth daily.  Marland Kitchen esomeprazole (NEXIUM) 40 MG capsule TAKE 1 CAPSULE BY MOUTH DAILY BEFORE BREAKFAST.  Marland Kitchen ezetimibe (ZETIA) 10 MG tablet TAKE 1 TABLET BY MOUTH ONCE A DAY.  . ferrous sulfate 325 (65 FE) MG tablet Take 325 mg by mouth daily with breakfast.  . gabapentin (NEURONTIN) 100 MG capsule TAKE 1 TO 2 CAPSULES BY MOUTH AT BEDTIME FOR NERVE PAIN.  . HYDROcodone-acetaminophen (NORCO) 5-325 MG tablet Take 1 tablet by mouth 2 (two) times daily as needed for moderate pain.  . hydrOXYzine (ATARAX/VISTARIL) 25 MG tablet TAKE (1) TABLET BY MOUTH EVERY (8) HOURS AS NEEDED FOR ITCHING.  . lidocaine (LIDODERM) 5 % Place 1 patch onto the skin daily. Remove & Discard patch within 12 hours or as directed by MD  . lubiprostone (AMITIZA) 8 MCG capsule Take 8 mcg by mouth as needed.   . mirabegron ER (MYRBETRIQ) 25 MG TB24 tablet Take 1 tablet (25 mg total) by mouth at bedtime. For bladder  . Multiple Vitamin (MULTIVITAMIN WITH MINERALS) TABS tablet Take 1 tablet by mouth daily.  . Probiotic Product (PROBIOTIC DAILY PO) Take 1 capsule by mouth daily.   . Tiotropium Bromide Monohydrate (SPIRIVA RESPIMAT) 1.25 MCG/ACT AERS Inhale 2 puffs into the lungs daily.   No facility-administered encounter medications on file as of 11/19/2018.     ALLERGIES:  Allergies  Allergen Reactions  . Ace Inhibitors Hives, Shortness Of Breath and Swelling  . Aspirin Other (See Comments)    Blood in stool and nose bleed   . Codeine Swelling  . Pineapple Shortness Of Breath and Swelling  . Plasticized Base [Plastibase] Hives and Swelling    No plastics  . Ultram [Tramadol] Swelling  . Adhesive [Tape] Swelling and Rash  . Naprosyn [Naproxen] Swelling  . Biaxin [Clarithromycin] Nausea And Vomiting and Other (See Comments)    Dizziness.  . Clindamycin/Lincomycin Other (See Comments)    Burning sensation throat, abdomen  . Linzess [Linaclotide]     DIARRHEA, FECAL INCONTINENCE  . Metronidazole     NAUSEA AND WEAKNESS  . Nsaids   . Penicillins     Lip swelling, Hives Has patient had a PCN reaction causing immediate rash, facial/tongue/throat swelling, SOB or lightheadedness with hypotension:YES Has patient had a PCN reaction causing severe rash involving mucus membranes or skin necrosis: NO Has patient had a PCN reaction that required hospitalization NO Has patient had a PCN reaction occurring within the last 10 years: NO If all of the above answers are "NO", then may proceed with Cephalosporin use.  . Varenicline Tartrate Swelling  . Xarelto [Rivaroxaban]     Bleeding     PHYSICAL EXAM:  ECOG Performance status: 1  Vitals:   11/19/18 1442  BP: (!) 143/66  Pulse: 60  Resp: 18  Temp: 97.9 F (36.6 C)  SpO2: 95%   Filed Weights   11/19/18 1442  Weight: 144 lb 4.8 oz (65.5 kg)    Physical Exam Vitals signs reviewed.  Constitutional:      Appearance: Normal appearance.  Cardiovascular:     Rate and Rhythm: Normal rate and regular rhythm.     Heart sounds: Normal heart sounds.  Pulmonary:     Effort: Pulmonary effort is normal.     Breath sounds: Normal breath sounds.  Abdominal:     General:  There is no distension.     Palpations: Abdomen is soft. There is no mass.  Musculoskeletal:        General: No swelling.  Skin:    General: Skin is warm.  Neurological:     General: No focal deficit present.     Mental Status: She is alert and oriented to person, place, and time.   Psychiatric:        Mood and Affect: Mood normal.        Behavior: Behavior normal.      LABORATORY DATA:  I have reviewed the labs as listed.  CBC    Component Value Date/Time   WBC 6.3 11/13/2018 1400   RBC 3.65 (L) 11/13/2018 1400   HGB 10.8 (L) 11/13/2018 1400   HCT 33.6 (L) 11/13/2018 1400   PLT 193 11/13/2018 1400   MCV 92.1 11/13/2018 1400   MCH 29.6 11/13/2018 1400   MCHC 32.1 11/13/2018 1400   RDW 13.6 11/13/2018 1400   LYMPHSABS 1.9 11/13/2018 1400   MONOABS 0.6 11/13/2018 1400   EOSABS 0.1 11/13/2018 1400   BASOSABS 0.0 11/13/2018 1400   CMP Latest Ref Rng & Units 11/13/2018 06/28/2018 02/12/2018  Glucose 70 - 99 mg/dL 96 86 109(H)  BUN 8 - 23 mg/dL 19 24 23   Creatinine 0.44 - 1.00 mg/dL 1.01(H) 1.01(H) 1.04(H)  Sodium 135 - 145 mmol/L 141 144 142  Potassium 3.5 - 5.1 mmol/L 4.3 4.9 4.5  Chloride 98 - 111 mmol/L 107 109 108  CO2 22 - 32 mmol/L 26 26 27   Calcium 8.9 - 10.3 mg/dL 9.4 10.0 9.5  Total Protein 6.5 - 8.1 g/dL 6.9 6.7 7.4  Total Bilirubin 0.3 - 1.2 mg/dL 0.6 0.5 0.8  Alkaline Phos 38 - 126 U/L 50 - 45  AST 15 - 41 U/L 21 18 22   ALT 0 - 44 U/L 11 8 10        DIAGNOSTIC IMAGING:  I have independently reviewed the scans and discussed with the patient.   I have reviewed Venita Lick LPN's note and agree with the documentation.  I personally performed a face-to-face visit, made revisions and my assessment and plan is as follows.    ASSESSMENT & PLAN:   Normocytic anemia 1.  Normocytic anemia: - Last Feraheme infusion on 12/14/2016. -Colonoscopy on 08/05/2016 showing good preparation of the colon inadequate 25 polyps less than 10 mm, redundant left colon, diverticulosis at the hepatic flexure, external hemorrhoids. 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 -Patient currently on iron tablet daily.  -She also has mild degree of CKD with a GFR ranging from 50-57 mL/min. -We reviewed her blood work.  Hemoglobin has decreased to 10.8 with a normal MCV of 92.  Previous hemoglobin was between 11 and 11.5.  Ferritin is down to 304. -We will see her back in 4 months with repeat ferritin, iron panel, X79, folic acid and a CBC.  2.  Presumed stage I right lung cancer: -Patient had more than 30-pack-year smoking history.  Screening CT scan showed right upper lobe lung nodule. - Treated with SBRT 54 Gy in 3 fractions from 01/30/2018 through 2019. -CT of the chest on 11/14/2018 shows radiation changes in the medial right upper lobe.  Underlying medial right upper lobe nodule is no longer discretely visualized.  No evidence of recurrence or metastatic disease. -She is smoking about 5 cigarettes/day.  We have  counseled her to quit smoking. -I plan to repeat CT scan in 6 months.   Total time spent is 25 minutes with more than 50% of time spent face-to-face discussing treatment, follow-up plan and coordination of care.  Orders placed this encounter:  Orders Placed This Encounter  Procedures  . Iron and TIBC  . Ferritin  . Vitamin B12  . Folate  . CBC with Differential      Derek Jack, MD [facility] [phone]

## 2018-11-19 NOTE — Assessment & Plan Note (Addendum)
1.  Normocytic anemia: - Last Feraheme infusion on 12/14/2016. -Colonoscopy on 08/05/2016 showing good preparation of the colon inadequate 25 polyps less than 10 mm, redundant left colon, diverticulosis at the hepatic flexure, external hemorrhoids. 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 -Patient currently on iron tablet daily. -She also has mild degree of CKD with a GFR ranging from 50-57 mL/min. -We reviewed her blood work.  Hemoglobin has decreased to 10.8 with a normal MCV of 92.  Previous hemoglobin was between 11 and 11.5.  Ferritin is down to 304. -We will see her back in 4 months with repeat ferritin, iron panel, O06, folic acid and a CBC.  2.  Presumed stage I right lung cancer: -Patient had more than 30-pack-year smoking history.  Screening CT scan showed right upper lobe lung nodule. - Treated with SBRT 54 Gy in 3 fractions from 01/30/2018 through 2019. -CT of the chest on 11/14/2018 shows radiation changes in the medial right upper lobe.  Underlying medial right upper lobe nodule is no longer discretely visualized.  No evidence of recurrence or metastatic disease. -She is smoking about 5 cigarettes/day.  We have counseled her to quit smoking. -I plan to repeat CT scan in 6 months.

## 2018-11-19 NOTE — Patient Instructions (Addendum)
[facility] at [facility] Discharge Instructions  You were seen today by Dr. Katragadda. He went over your recent lab results. He will see you back in 4 months for labs and follow up.   Thank you for choosing [facility] at [facility] to provide your oncology and hematology care.  To afford each patient quality time with our provider, please arrive at least 15 minutes before your scheduled appointment time.   If you have a lab appointment with the Cancer Center please come in thru the  Main Entrance and check in at the main information desk  You need to re-schedule your appointment should you arrive 10 or more minutes late.  We strive to give you quality time with our providers, and arriving late affects you and other patients whose appointments are after yours.  Also, if you no show three or more times for appointments you may be dismissed from the clinic at the providers discretion.     Again, thank you for choosing [facility].  Our hope is that these requests will decrease the amount of time that you wait before being seen by our physicians.       _____________________________________________________________  Should you have questions after your visit to [facility], please contact our office at [phone] between the hours of 8:00 a.m. and 4:30 p.m.  Voicemails left after 4:00 p.m. will not be returned until the following business day.  For prescription refill requests, have your pharmacy contact our office and allow 72 hours.    Cancer Center Support Programs:   > Cancer Support Group  2nd Tuesday of the month 1pm-2pm, Journey Room    

## 2018-12-02 ENCOUNTER — Telehealth: Payer: Self-pay | Admitting: Family Medicine

## 2018-12-02 NOTE — Telephone Encounter (Signed)
Patient left two voicemail's asking for Margreta Journey to call her.  CB# [phone]

## 2018-12-03 ENCOUNTER — Ambulatory Visit: Payer: Medicare HMO | Admitting: Cardiovascular Disease

## 2018-12-03 NOTE — Telephone Encounter (Signed)
Call placed to patient.   Inquired as to if appointment is required at this time.   Advised that we will wait in lieu of COVID precautions at this time.

## 2018-12-27 ENCOUNTER — Encounter: Payer: Self-pay | Admitting: Family Medicine

## 2018-12-27 ENCOUNTER — Other Ambulatory Visit: Payer: Self-pay

## 2018-12-27 ENCOUNTER — Ambulatory Visit (INDEPENDENT_AMBULATORY_CARE_PROVIDER_SITE_OTHER): Payer: Medicare HMO | Admitting: Family Medicine

## 2018-12-27 DIAGNOSIS — E46 Unspecified protein-calorie malnutrition: Secondary | ICD-10-CM | POA: Diagnosis not present

## 2018-12-27 DIAGNOSIS — R2681 Unsteadiness on feet: Secondary | ICD-10-CM | POA: Diagnosis not present

## 2018-12-27 DIAGNOSIS — J410 Simple chronic bronchitis: Secondary | ICD-10-CM

## 2018-12-27 DIAGNOSIS — R531 Weakness: Secondary | ICD-10-CM

## 2018-12-27 MED ORDER — GABAPENTIN 100 MG PO CAPS: ORAL_CAPSULE | ORAL | 2 refills | Status: DC

## 2018-12-27 MED ORDER — EZETIMIBE 10 MG PO TABS: 10.0000 mg | ORAL_TABLET | Freq: Every day | ORAL | 2 refills | Status: DC

## 2018-12-27 MED ORDER — MIRABEGRON ER 25 MG PO TB24: 25.0000 mg | ORAL_TABLET | Freq: Every day | ORAL | 2 refills | Status: DC

## 2018-12-27 NOTE — Progress Notes (Signed)
Virtual Visit via Telephone Note  I connected with Charlotte Henry on 12/27/18 at 12:58 PM EDT by telephone and verified that I am speaking with the correct person using two identifiers.    Pt location: at home   Physician location:  In office, [facility], [name] MD     On call: patient and physician     I discussed the limitations, risks, security and privacy concerns of performing an evaluation and management service by telephone and the availability of in person appointments. I also discussed with the patient that there may be a patient responsible charge related to this service. The patient expressed understanding and agreed to proceed.   History of Present Illness:  Telephone visit in setting of COVID-19 and pt very high risk as she has small cell lung CA, but uable  Stays in the bed most of the day she requires help with bathing and her meals.  Currently her family members are trying to come in to help her but she needs someone during the day to assist with bathing and her ADLs.  She often Eats mostly TV dinners , otherwise family may bring dinner evenings when they get off work  States that she feels weak all the time.  She is currently under surveillance for her lung cancer no treatment can be done at this time.  She has not had any falls.  Stays in either bed or chair. She has walker at home. She has shower chair   She is taking all of her medications as prescribed which we reviewed over the phone.  Her appetite is up and down she is still drinking Ensure daily she has known protein calorie malnutrition.  COPD she has not had any problems with her breathing recently she is using her inhalers as prescribed    Observations/Objective: 110/62 , does not have a scale    Assessment and Plan:  PCS Form completed  Protein malnutrition/generalized weakness - continue ensure Lung cancer- followed by oncology no active intervention, this with her other  comorbidites leads to chronic weakness, in elderly state would benefit from aide during day to assist with meal prep, baths, med reminders  COPD- no recent exacerbations, no changes to meds  Gait instability- using walker   CKD- recent labs reviewed renal function okay   DM- is diet controlled    Follow Up Instructions:    I discussed the assessment and treatment plan with the patient. The patient was provided an opportunity to ask questions and all were answered. The patient agreed with the plan and demonstrated an understanding of the instructions.   The patient was advised to call back or seek an in-person evaluation if the symptoms worsen or if the condition fails to improve as anticipated.  I provided 12 minutes of non-face-to-face time during this encounter. End Time 1:10pm  [name], MD

## 2019-01-07 ENCOUNTER — Telehealth: Payer: Self-pay | Admitting: *Deleted

## 2019-01-07 NOTE — Telephone Encounter (Signed)
Received call from patient daughter, Anderson Malta [phone] telephone.   Reports that she hs concerns about patient that she would like to share with provider.   States that she feels the patient's dementia is progressing rapidly, but patient does not want to give up her independence at this time and move in with her daughter. Attempted to have [facility] Aide sent to home, but patient does not qualify for medicaid. States that medicaid is paying for medicare supplemental insurance ($140/ month). Since this is no longer being pulled from pre-tax deductions, it is putting her gross income over the limit.   Requested MD to call daughter to discuss.

## 2019-01-08 IMAGING — DX DG ABDOMEN ACUTE W/ 1V CHEST
2 series · 2 of 2 positions shown · non-contrast
Comparison: 03/25/2016.

CLINICAL DATA: Nausea.  Fall.  COPD.

EXAM:
DG ABDOMEN ACUTE W/ 1V CHEST

[chest pa]
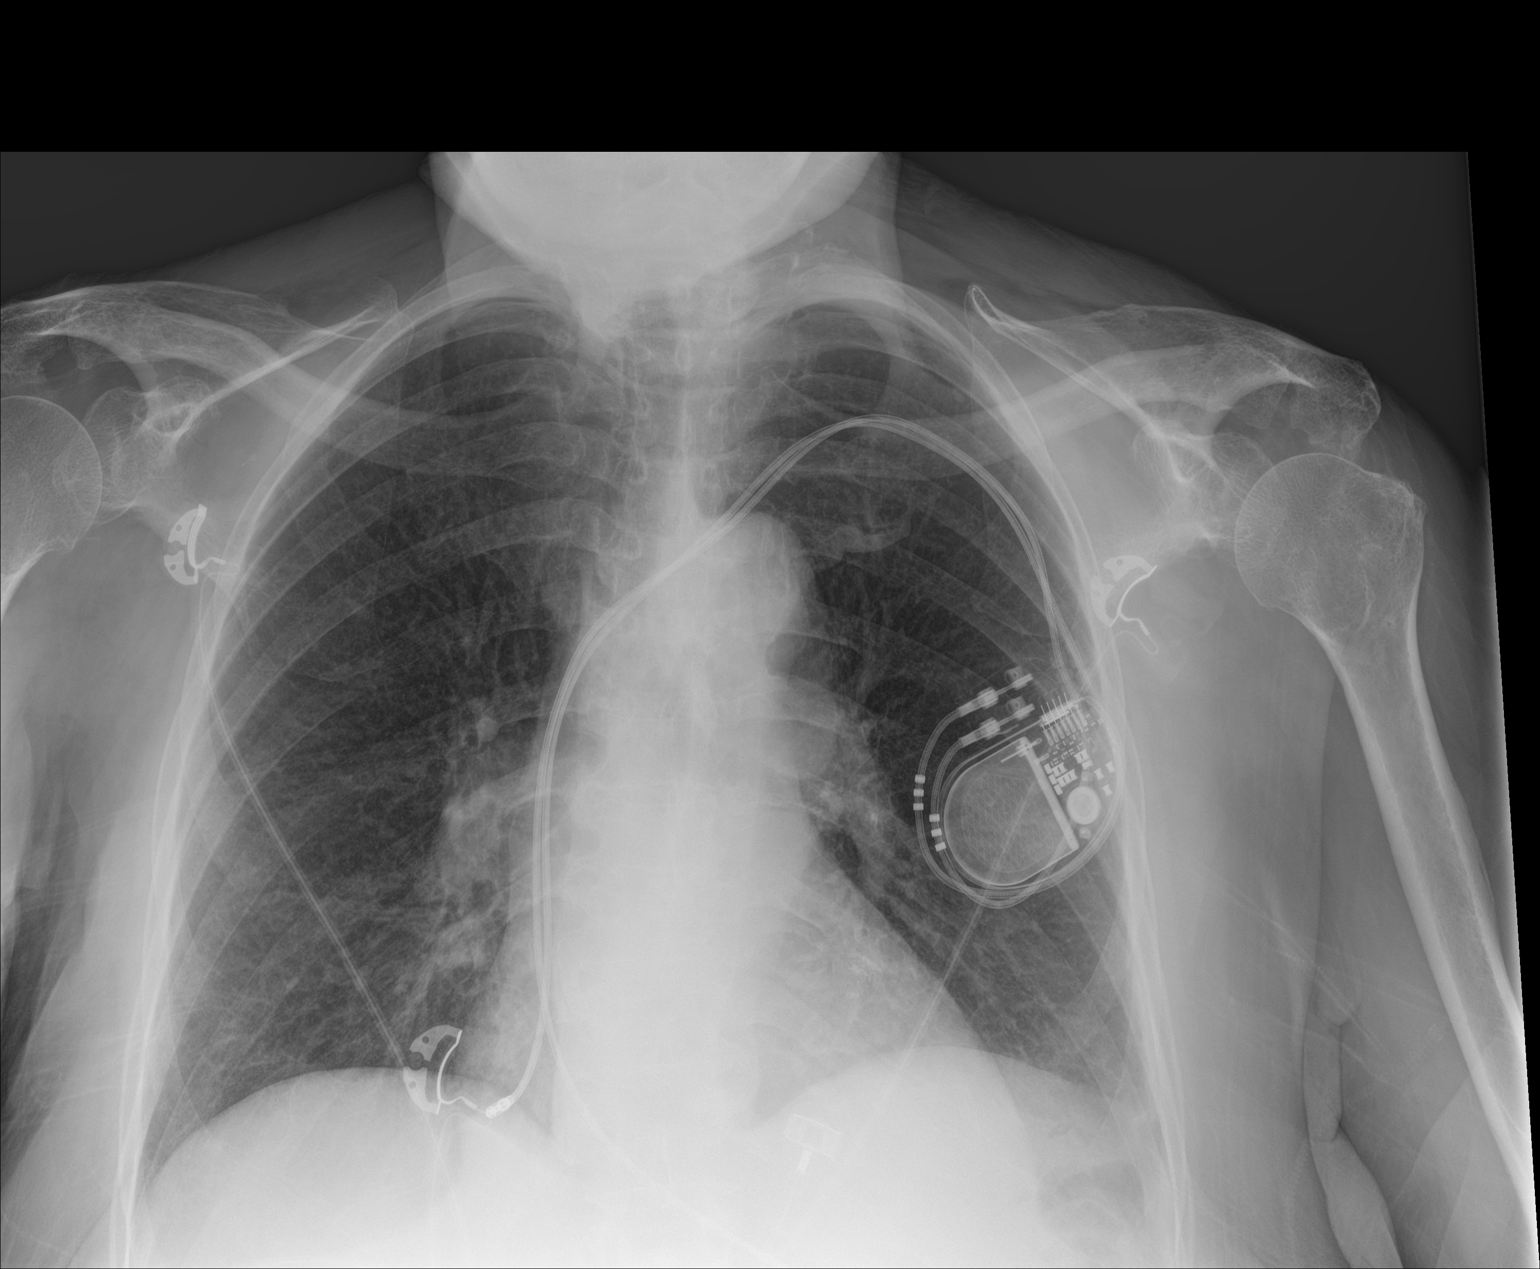

[abdomen supine]
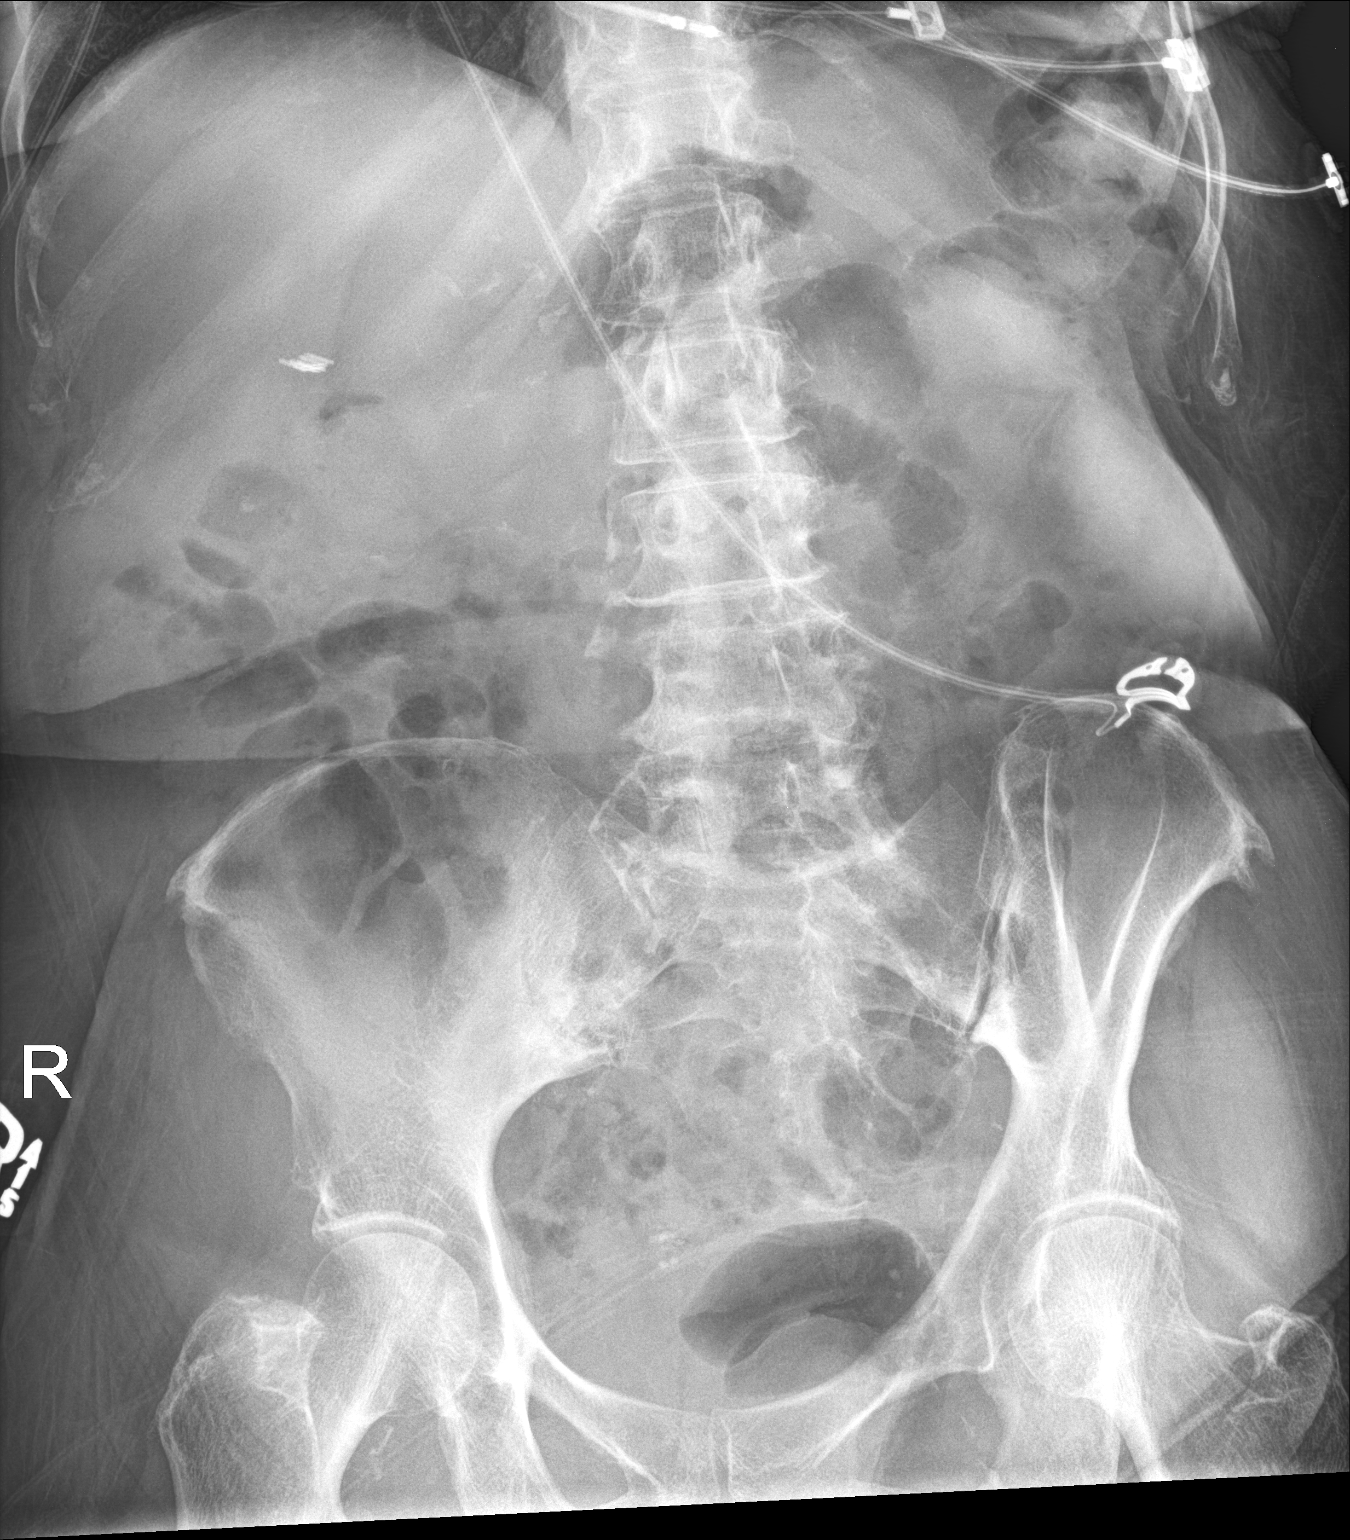

[2 of 2 positions shown; findings below may reference images not displayed]

FINDINGS: Cardiac pacer with lead tips in right atrium and right ventricle.
Heart size normal. No focal infiltrate. No pleural effusion or
pneumothorax.

Surgical clips right upper quadrant. Several air-filled loops of
small bowel are noted. Colonic gas pattern is normal. No free air.
Aortoiliac and peripheral vascular calcification. Visceral including
renal vascular calcification Pelvic calcifications consistent
phleboliths . Degenerative changes noted throughout the spine and
both hips.
IMPRESSION: 1. Cardiac pacer with lead tips in right atrium right ventricle. No
acute cardiopulmonary disease identified.

2. Several air-filled loops of nondilated small bowel . is
nonspecific finding. Colonic gas pattern is normal. Follow-up
abdominal series can be obtained to exclude developing small bowel
distention.

3. Aortoiliac and peripheral vascular disease. Visceral include
renal atherosclerotic vascular disease.

## 2019-01-08 NOTE — Telephone Encounter (Signed)
I spoke to patient's daughter.  Unfortunately her Medicaid does not cover personal care services they are unable to afford out-of-pocket.  Mother does not want to move in with her.  Advised that she can look at assisted living facilities and see the financial aspects of that.  She states that mother is often forgetful but she does still actually do her own bills and finances without any difficulty.  She also lives alone.  I do recommend that she has some aide at home to assist with meal preparation she does have multiple medical problems including underlying lung cancer states that she is going to work with trying to get her to move in with her permanently.  They are also being put on the list for CAP  Services.  She will call if she needs anything else

## 2019-01-13 ENCOUNTER — Ambulatory Visit (INDEPENDENT_AMBULATORY_CARE_PROVIDER_SITE_OTHER): Payer: Medicare HMO | Admitting: *Deleted

## 2019-01-13 DIAGNOSIS — I495 Sick sinus syndrome: Secondary | ICD-10-CM

## 2019-01-14 LAB — CUP PACEART REMOTE DEVICE CHECK
Battery Remaining Longevity: 107 mo
Battery Remaining Percentage: 95.5 %
Battery Voltage: 2.99 V
Brady Statistic AP VP Percent: 67 %
Brady Statistic AP VS Percent: 1 %
Brady Statistic AS VP Percent: 33 %
Brady Statistic AS VS Percent: 1 %
Brady Statistic RA Percent Paced: 66 %
Brady Statistic RV Percent Paced: 99 %
Date Time Interrogation Session: 20200810190919
Implantable Lead Implant Date: 20171020
Implantable Lead Implant Date: 20171020
Implantable Lead Location: 753859
Implantable Lead Location: 753860
Implantable Pulse Generator Implant Date: 20171020
Lead Channel Impedance Value: 380 Ohm
Lead Channel Impedance Value: 440 Ohm
Lead Channel Pacing Threshold Amplitude: 0.5 V
Lead Channel Pacing Threshold Amplitude: 0.75 V
Lead Channel Pacing Threshold Pulse Width: 0.4 ms
Lead Channel Pacing Threshold Pulse Width: 0.4 ms
Lead Channel Sensing Intrinsic Amplitude: 1.8 mV
Lead Channel Sensing Intrinsic Amplitude: 11.7 mV
Lead Channel Setting Pacing Amplitude: 2 V
Lead Channel Setting Pacing Amplitude: 2.5 V
Lead Channel Setting Pacing Pulse Width: 0.4 ms
Lead Channel Setting Sensing Sensitivity: 3.5 mV
Pulse Gen Model: 2272
Pulse Gen Serial Number: 3180497

## 2019-01-21 ENCOUNTER — Encounter: Payer: Self-pay | Admitting: Cardiology

## 2019-01-21 ENCOUNTER — Other Ambulatory Visit: Payer: Self-pay

## 2019-01-21 NOTE — Progress Notes (Signed)
Remote pacemaker transmission.   

## 2019-01-27 ENCOUNTER — Other Ambulatory Visit: Payer: Self-pay

## 2019-01-27 ENCOUNTER — Emergency Department (HOSPITAL_COMMUNITY)
Admission: EM | Admit: 2019-01-27 | Discharge: 2019-01-27 | Disposition: A | Discharge status: Home / Self Care | Payer: Medicare HMO | Attending: Emergency Medicine | Admitting: Emergency Medicine

## 2019-01-27 ENCOUNTER — Telehealth: Payer: Self-pay | Admitting: *Deleted

## 2019-01-27 ENCOUNTER — Encounter (HOSPITAL_COMMUNITY): Payer: Self-pay | Admitting: Emergency Medicine

## 2019-01-27 DIAGNOSIS — Z5321 Procedure and treatment not carried out due to patient leaving prior to being seen by health care provider: Secondary | ICD-10-CM | POA: Insufficient documentation

## 2019-01-27 DIAGNOSIS — R1110 Vomiting, unspecified: Secondary | ICD-10-CM | POA: Diagnosis not present

## 2019-01-27 LAB — CBC
HCT: 33.6 % — ABNORMAL LOW (ref 36.0–46.0)
Hemoglobin: 10.6 g/dL — ABNORMAL LOW (ref 12.0–15.0)
MCH: 29.5 pg (ref 26.0–34.0)
MCHC: 31.5 g/dL (ref 30.0–36.0)
MCV: 93.6 fL (ref 80.0–100.0)
Platelets: 229 10*3/uL (ref 150–400)
RBC: 3.59 MIL/uL — ABNORMAL LOW (ref 3.87–5.11)
RDW: 13.6 % (ref 11.5–15.5)
WBC: 6.7 10*3/uL (ref 4.0–10.5)
nRBC: 0 % (ref 0.0–0.2)

## 2019-01-27 LAB — COMPREHENSIVE METABOLIC PANEL
ALT: 13 U/L (ref 0–44)
AST: 22 U/L (ref 15–41)
Albumin: 3.9 g/dL (ref 3.5–5.0)
Alkaline Phosphatase: 44 U/L (ref 38–126)
Anion gap: 7 (ref 5–15)
BUN: 24 mg/dL — ABNORMAL HIGH (ref 8–23)
CO2: 24 mmol/L (ref 22–32)
Calcium: 9.4 mg/dL (ref 8.9–10.3)
Chloride: 106 mmol/L (ref 98–111)
Creatinine, Ser: 1 mg/dL (ref 0.44–1.00)
GFR calc Af Amer: >60 mL/min (ref >60)
GFR calc non Af Amer: 54 mL/min — ABNORMAL LOW (ref >60)
Glucose, Bld: 124 mg/dL — ABNORMAL HIGH (ref 70–99)
Potassium: 4.3 mmol/L (ref 3.5–5.1)
Sodium: 137 mmol/L (ref 135–145)
Total Bilirubin: 0.5 mg/dL (ref 0.3–1.2)
Total Protein: 7.2 g/dL (ref 6.5–8.1)

## 2019-01-27 LAB — LIPASE, BLOOD: Lipase: 18 U/L (ref 11–51)

## 2019-01-27 MED ORDER — ONDANSETRON 4 MG PO TBDP: 4.0000 mg | ORAL_TABLET | Freq: Three times a day (TID) | ORAL | 0 refills | Status: DC | PRN

## 2019-01-27 NOTE — ED Triage Notes (Signed)
Pt states she has been feeling nauseated and weak since waking up this morning.

## 2019-01-27 NOTE — Telephone Encounter (Signed)
Received call from patient. [phone]~ telephone.   States that she is "so sick". States that she has been having N/V x2 days with dizziness and weakness today. States that she tried to drink some water, but was not able to keep it down. Advised that she may need to go to ER for eval of dehydration. Reports that she is not going to ER today.   Advised prescription for Zofran has been sent to pharmacy. Pharmacy will deliver. Advised to try to push fluids after taking nausea medication. Reports that she is trying to sip some Gatorade.   Denies HA, fever, cough, SOB. States that BP is 148/80. States that she doesn't have any way to check her CBG.   Appointment scheduled with PCP for telephone visit on 01/28/2019.

## 2019-01-27 NOTE — Telephone Encounter (Signed)
Call pt daughter advise her we recommend she go to ER for the N/V not tolerating fluids

## 2019-01-27 NOTE — Telephone Encounter (Signed)
Call placed to [facility], patient daughter [phone]~ telephone.  Reports that patient has also been having increased swelling to her foot. Reports that she was stung by a wasp or bee on 01/14/2019 the top of her foot. Reports that she has been taking Benadryl since then, but foot continues to swell. States that patient is allergic to bee stings.   Advised to take her to ER for possible cellulitis. Agrees with plan. Will take patient to ER.

## 2019-01-28 ENCOUNTER — Ambulatory Visit (INDEPENDENT_AMBULATORY_CARE_PROVIDER_SITE_OTHER): Payer: Medicare HMO | Admitting: Family Medicine

## 2019-01-28 ENCOUNTER — Encounter: Payer: Self-pay | Admitting: Family Medicine

## 2019-01-28 ENCOUNTER — Ambulatory Visit: Payer: Self-pay | Admitting: Family Medicine

## 2019-01-28 VITALS — BP 136/72 | HR 92 | Temp 98.9°F | Resp 16 | Wt 140.8 lb

## 2019-01-28 DIAGNOSIS — C3411 Malignant neoplasm of upper lobe, right bronchus or lung: Secondary | ICD-10-CM

## 2019-01-28 DIAGNOSIS — R634 Abnormal weight loss: Secondary | ICD-10-CM

## 2019-01-28 DIAGNOSIS — E46 Unspecified protein-calorie malnutrition: Secondary | ICD-10-CM

## 2019-01-28 DIAGNOSIS — E860 Dehydration: Secondary | ICD-10-CM | POA: Diagnosis not present

## 2019-01-28 DIAGNOSIS — R110 Nausea: Secondary | ICD-10-CM | POA: Diagnosis not present

## 2019-01-28 NOTE — Patient Instructions (Signed)
CT scan to be done of lungs/and abdomen Use the zofran  Eat at least twice a day and ensure  F/U pending results

## 2019-01-28 NOTE — Progress Notes (Signed)
   Subjective:    Patient ID: Charlotte Henry, female    DOB: May 26, 1941, 78 y.o.   MRN: [id number]  Patient presents for Follow-up (weakness, N/V) Patient here to follow-up nausea vomiting dehydration as well as swelling of her foot after a bee sting.  See previous phone notes.  We did try getting her to go to the emergency room she did check in with triage had her blood drawn and then she left without being seen because she had been waiting for 4 hours.  Her labs were fairly unremarkable white blood cell count was normal creatinine was normal and lipase normal. She has been taking benadryl but stopped Sunday once the swelling went down. She has not been eating very much only one meal a day and drinking ensure, weight down 5lbs since Jan. Today states she has some nausea but zofran helped, no current abd pain, just feels weak, no diarrhea, urinating normally unable to leave a sample No SOB.  Left foot swelling has resolved just has a white spot where the stinger was that she pulled out  Daughter with her today as well     Review Of Systems:  GEN- denies fatigue, fever, +weight loss,+weakness, recent illness HEENT- denies eye drainage, change in vision, nasal discharge, CVS- denies chest pain, palpitations RESP- denies SOB, cough, wheeze ABD- denies N/V, change in stools, abd pain GU- denies dysuria, hematuria, dribbling, incontinence MSK- denies joint pain, muscle aches, injury Neuro- denies headache, dizziness, syncope, seizure activity       Objective:    BP 136/72   Pulse 92   Temp 98.9 F (37.2 C) (Oral)   Resp 16   SpO2 96%  GEN- NAD, alert and oriented x3, sitting in wheelchair, elderly female weak appearing HEENT- PERRL, EOMI, non injected sclera, pink conjunctiva, MM a little dry , oropharynx clear Neck- Supple, no thyromegaly CVS- 2/6 SEM no murmur RESP-CTAB ABD-NABS,soft,NT,ND EXT- No edema, no erythema to skin, NT Pulses- Radial, DP-diminished         Assessment & Plan:      Problem List Items Addressed This Visit      Unprioritized   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[place])    She is followed by heme/onc, last scan earlier this year but with progressive weight loss, nausea, weakness I recommend we repeat CT chest and abd now instead of the fall Pt agreeable as long as not this week      Protein-calorie malnutrition ([place])    Discussed eating twice a day at mininum she does get up late in the morning Family still working on getting a private CNA to help sit with her and help with ADL's       Other Visit Diagnoses    Dehydration    -  Primary   Given about 600cc of fluid, did not want to fluid overload her, renal function and BUN looked pretty good yesterday, she also drank ensure in the office without difficulty She did feel better after fluids    Nausea       WBC normal, no sign of infection of the foot, zofran prn      Note: This dictation was prepared with Dragon dictation along with smaller phrase technology. Any transcriptional errors that result from this process are unintentional.

## 2019-01-28 NOTE — Assessment & Plan Note (Signed)
She is followed by heme/onc, last scan earlier this year but with progressive weight loss, nausea, weakness I recommend we repeat CT chest and abd now instead of the fall Pt agreeable as long as not this week

## 2019-01-28 NOTE — Assessment & Plan Note (Signed)
Discussed eating twice a day at mininum she does get up late in the morning Family still working on getting a private CNA to help sit with her and help with ADL's

## 2019-01-29 ENCOUNTER — Ambulatory Visit: Payer: Medicare HMO | Admitting: Family Medicine

## 2019-01-30 ENCOUNTER — Encounter (HOSPITAL_COMMUNITY): Payer: Self-pay | Admitting: Emergency Medicine

## 2019-01-30 ENCOUNTER — Observation Stay (HOSPITAL_COMMUNITY)
Admission: EM | Admit: 2019-01-30 | Discharge: 2019-01-31 | Disposition: A | Discharge status: Home / Self Care | Payer: Medicare HMO | Attending: Internal Medicine | Admitting: Internal Medicine

## 2019-01-30 ENCOUNTER — Telehealth: Payer: Self-pay | Admitting: *Deleted

## 2019-01-30 ENCOUNTER — Emergency Department (HOSPITAL_COMMUNITY): Payer: Medicare HMO

## 2019-01-30 ENCOUNTER — Other Ambulatory Visit: Payer: Self-pay

## 2019-01-30 DIAGNOSIS — J410 Simple chronic bronchitis: Secondary | ICD-10-CM

## 2019-01-30 DIAGNOSIS — I10 Essential (primary) hypertension: Secondary | ICD-10-CM | POA: Diagnosis not present

## 2019-01-30 DIAGNOSIS — Z20828 Contact with and (suspected) exposure to other viral communicable diseases: Secondary | ICD-10-CM | POA: Insufficient documentation

## 2019-01-30 DIAGNOSIS — E7800 Pure hypercholesterolemia, unspecified: Secondary | ICD-10-CM

## 2019-01-30 DIAGNOSIS — I442 Atrioventricular block, complete: Secondary | ICD-10-CM | POA: Diagnosis not present

## 2019-01-30 DIAGNOSIS — F17210 Nicotine dependence, cigarettes, uncomplicated: Secondary | ICD-10-CM | POA: Insufficient documentation

## 2019-01-30 DIAGNOSIS — I259 Chronic ischemic heart disease, unspecified: Secondary | ICD-10-CM | POA: Diagnosis not present

## 2019-01-30 DIAGNOSIS — R112 Nausea with vomiting, unspecified: Secondary | ICD-10-CM | POA: Insufficient documentation

## 2019-01-30 DIAGNOSIS — R42 Dizziness and giddiness: Principal | ICD-10-CM

## 2019-01-30 DIAGNOSIS — R627 Adult failure to thrive: Secondary | ICD-10-CM | POA: Diagnosis not present

## 2019-01-30 DIAGNOSIS — R079 Chest pain, unspecified: Secondary | ICD-10-CM | POA: Diagnosis not present

## 2019-01-30 DIAGNOSIS — Z79899 Other long term (current) drug therapy: Secondary | ICD-10-CM | POA: Insufficient documentation

## 2019-01-30 DIAGNOSIS — I129 Hypertensive chronic kidney disease with stage 1 through stage 4 chronic kidney disease, or unspecified chronic kidney disease: Secondary | ICD-10-CM | POA: Diagnosis not present

## 2019-01-30 DIAGNOSIS — N183 Chronic kidney disease, stage 3 (moderate): Secondary | ICD-10-CM | POA: Insufficient documentation

## 2019-01-30 DIAGNOSIS — R0789 Other chest pain: Secondary | ICD-10-CM | POA: Diagnosis present

## 2019-01-30 DIAGNOSIS — E785 Hyperlipidemia, unspecified: Secondary | ICD-10-CM | POA: Diagnosis present

## 2019-01-30 DIAGNOSIS — E1122 Type 2 diabetes mellitus with diabetic chronic kidney disease: Secondary | ICD-10-CM | POA: Insufficient documentation

## 2019-01-30 DIAGNOSIS — C3411 Malignant neoplasm of upper lobe, right bronchus or lung: Secondary | ICD-10-CM | POA: Insufficient documentation

## 2019-01-30 DIAGNOSIS — I739 Peripheral vascular disease, unspecified: Secondary | ICD-10-CM | POA: Diagnosis present

## 2019-01-30 DIAGNOSIS — J449 Chronic obstructive pulmonary disease, unspecified: Secondary | ICD-10-CM | POA: Diagnosis not present

## 2019-01-30 LAB — BASIC METABOLIC PANEL
Anion gap: 7 (ref 5–15)
BUN: 17 mg/dL (ref 8–23)
CO2: 24 mmol/L (ref 22–32)
Calcium: 9.3 mg/dL (ref 8.9–10.3)
Chloride: 108 mmol/L (ref 98–111)
Creatinine, Ser: 1.08 mg/dL — ABNORMAL HIGH (ref 0.44–1.00)
GFR calc Af Amer: 57 mL/min — ABNORMAL LOW (ref >60)
GFR calc non Af Amer: 49 mL/min — ABNORMAL LOW (ref >60)
Glucose, Bld: 115 mg/dL — ABNORMAL HIGH (ref 70–99)
Potassium: 3.8 mmol/L (ref 3.5–5.1)
Sodium: 139 mmol/L (ref 135–145)

## 2019-01-30 LAB — RAPID URINE DRUG SCREEN, HOSP PERFORMED
Amphetamines: NOT DETECTED
Barbiturates: NOT DETECTED
Benzodiazepines: NOT DETECTED
Cocaine: NOT DETECTED
Opiates: NOT DETECTED
Tetrahydrocannabinol: NOT DETECTED

## 2019-01-30 LAB — CBC
HCT: 31.2 % — ABNORMAL LOW (ref 36.0–46.0)
Hemoglobin: 9.9 g/dL — ABNORMAL LOW (ref 12.0–15.0)
MCH: 29.8 pg (ref 26.0–34.0)
MCHC: 31.7 g/dL (ref 30.0–36.0)
MCV: 94 fL (ref 80.0–100.0)
Platelets: 236 10*3/uL (ref 150–400)
RBC: 3.32 MIL/uL — ABNORMAL LOW (ref 3.87–5.11)
RDW: 13.7 % (ref 11.5–15.5)
WBC: 5.9 10*3/uL (ref 4.0–10.5)
nRBC: 0 % (ref 0.0–0.2)

## 2019-01-30 LAB — TROPONIN I (HIGH SENSITIVITY)
Troponin I (High Sensitivity): 3 ng/L (ref <18)
Troponin I (High Sensitivity): 3 ng/L (ref <18)

## 2019-01-30 LAB — HEPATIC FUNCTION PANEL
ALT: 12 U/L (ref 0–44)
AST: 19 U/L (ref 15–41)
Albumin: 3.8 g/dL (ref 3.5–5.0)
Alkaline Phosphatase: 43 U/L (ref 38–126)
Bilirubin, Direct: 0.1 mg/dL (ref 0.0–0.2)
Indirect Bilirubin: 0.5 mg/dL (ref 0.3–0.9)
Total Bilirubin: 0.6 mg/dL (ref 0.3–1.2)
Total Protein: 7 g/dL (ref 6.5–8.1)

## 2019-01-30 LAB — TSH: TSH: 1.518 u[IU]/mL (ref 0.350–4.500)

## 2019-01-30 LAB — URINALYSIS, ROUTINE W REFLEX MICROSCOPIC
Bilirubin Urine: NEGATIVE
Glucose, UA: NEGATIVE mg/dL
Hgb urine dipstick: NEGATIVE
Ketones, ur: NEGATIVE mg/dL
Leukocytes,Ua: NEGATIVE
Nitrite: NEGATIVE
Protein, ur: NEGATIVE mg/dL
Specific Gravity, Urine: 1.012 (ref 1.005–1.030)
pH: 5 (ref 5.0–8.0)

## 2019-01-30 MED ORDER — SODIUM CHLORIDE 0.9% FLUSH: 3.0000 mL | Freq: Once | INTRAVENOUS | Status: DC

## 2019-01-30 MED ORDER — ADULT MULTIVITAMIN W/MINERALS CH
1.0000 | ORAL_TABLET | Freq: Every day | ORAL | Status: DC
  Administered 2019-01-31: 1 via ORAL
  Filled 2019-01-30: qty 1

## 2019-01-30 MED ORDER — ACETAMINOPHEN 650 MG RE SUPP: 650.0000 mg | Freq: Four times a day (QID) | RECTAL | Status: DC | PRN

## 2019-01-30 MED ORDER — MECLIZINE HCL 12.5 MG PO TABS
25.0000 mg | ORAL_TABLET | Freq: Four times a day (QID) | ORAL | Status: DC | PRN
  Administered 2019-01-31 (×2): 25 mg via ORAL
  Filled 2019-01-30 (×2): qty 2

## 2019-01-30 MED ORDER — ALPRAZOLAM 0.25 MG PO TABS: 0.2500 mg | ORAL_TABLET | Freq: Every evening | ORAL | Status: DC | PRN

## 2019-01-30 MED ORDER — CALCIUM CARBONATE-VITAMIN D 500-200 MG-UNIT PO TABS
1.0000 | ORAL_TABLET | Freq: Every day | ORAL | Status: DC
  Administered 2019-01-31: 1 via ORAL
  Filled 2019-01-30 (×5): qty 1

## 2019-01-30 MED ORDER — GABAPENTIN 100 MG PO CAPS
100.0000 mg | ORAL_CAPSULE | Freq: Every day | ORAL | Status: DC
  Administered 2019-01-31: 100 mg via ORAL
  Filled 2019-01-30: qty 1

## 2019-01-30 MED ORDER — SODIUM CHLORIDE 0.9 % IV BOLUS: 500.0000 mL | Freq: Once | INTRAVENOUS | Status: DC

## 2019-01-30 MED ORDER — HYDROXYZINE HCL 25 MG PO TABS: 25.0000 mg | ORAL_TABLET | Freq: Three times a day (TID) | ORAL | Status: DC | PRN

## 2019-01-30 MED ORDER — ACETAMINOPHEN 325 MG PO TABS: 650.0000 mg | ORAL_TABLET | Freq: Four times a day (QID) | ORAL | Status: DC | PRN

## 2019-01-30 MED ORDER — EZETIMIBE 10 MG PO TABS
10.0000 mg | ORAL_TABLET | Freq: Every day | ORAL | Status: DC
  Administered 2019-01-31: 10 mg via ORAL
  Filled 2019-01-30: qty 1

## 2019-01-30 MED ORDER — VITAMIN B-12 1000 MCG PO TABS
ORAL_TABLET | Freq: Every day | ORAL | Status: DC
  Administered 2019-01-31: 1000 ug via ORAL
  Filled 2019-01-30 (×5): qty 1

## 2019-01-30 MED ORDER — SODIUM CHLORIDE 0.9 % IV SOLN: INTRAVENOUS | Status: DC

## 2019-01-30 MED ORDER — ATORVASTATIN CALCIUM 10 MG PO TABS
10.0000 mg | ORAL_TABLET | ORAL | Status: DC
  Administered 2019-01-31: 10 mg via ORAL
  Filled 2019-01-30: qty 1

## 2019-01-30 MED ORDER — ALBUTEROL SULFATE (2.5 MG/3ML) 0.083% IN NEBU: 3.0000 mL | INHALATION_SOLUTION | RESPIRATORY_TRACT | Status: DC | PRN

## 2019-01-30 MED ORDER — ENOXAPARIN SODIUM 40 MG/0.4ML ~~LOC~~ SOLN
40.0000 mg | SUBCUTANEOUS | Status: DC
  Administered 2019-01-31: 40 mg via SUBCUTANEOUS
  Filled 2019-01-30: qty 0.4

## 2019-01-30 MED ORDER — FERROUS SULFATE 325 (65 FE) MG PO TABS
325.0000 mg | ORAL_TABLET | Freq: Every day | ORAL | Status: DC
  Administered 2019-01-31: 325 mg via ORAL
  Filled 2019-01-30: qty 1

## 2019-01-30 MED ORDER — PANTOPRAZOLE SODIUM 40 MG PO TBEC
40.0000 mg | DELAYED_RELEASE_TABLET | Freq: Every day | ORAL | Status: DC
  Administered 2019-01-31: 40 mg via ORAL
  Filled 2019-01-30: qty 1

## 2019-01-30 NOTE — ED Provider Notes (Addendum)
[facility] EMERGENCY DEPARTMENT Provider Note   CSN: [id number] Arrival date & time: 01/30/19  1522     History   Chief Complaint Chief Complaint  Patient presents with   Chest Pain    HPI Charlotte Henry is a 78 y.o. female.  HPI: A 78 year old patient with a history of CVA, treated diabetes and hypertension presents for evaluation of chest pain. Initial onset of pain was more than 6 hours ago. The patient's chest pain is described as heaviness/pressure/tightness and is worse with exertion. The patient complains of nausea. The patient's chest pain is not middle- or left-sided, is not well-localized, is not sharp and does not radiate to the arms/jaw/neck. The patient denies diaphoresis. The patient has smoked in the past 90 days and has a family history of coronary artery disease in a first-degree relative with onset less than age 14. The patient has no history of peripheral artery disease, has no history of hypercholesterolemia and does not have an elevated BMI (>=30).   78 y.o female with an extensive PMH including COPD, HTN,TIA, DM presents to the ED with a chief complaint of dizziness. Patient's mental status appears somewhat confused she reports a decrease in appetite for the past few weeks, states she has not been eating for a month.  Also endorses multiple syncopal episodes while at home, these were not witnessed by any family members.  She also endorses dizziness stating the room feels like it spinning sensation worse with changing in positions.  She also reports nausea along with some episodes of vomiting.  She does have a prior history of malignant neoplasm of the upper lobe on right lung.  According to patient's chart which have extensively review, she has called her PCP with the symptoms for the past week, was advised to continue to hydrate along with keep drinking her insurers and Zofran to help with her symptoms.  It appears patient has continue to worsen as days go by, she was  seen in office last week and received a 600 cc bolus of saline to help with her kidney function although was noted to be normal then.  She denies any sick contacts, fevers, chills chest pain, shortness of breath. Of note, her last bowel movement was last night without any blood.  Denies any urinary symptoms or abdominal pain.  The history is provided by the patient and medical records.  Chest Pain Associated symptoms: dizziness, nausea and vomiting   Associated symptoms: no abdominal pain, no back pain, no cough, no fever, no headache, no palpitations and no shortness of breath     Past Medical History:  Diagnosis Date   Arterial fibromuscular dysplasia (HCC)    ASCVD (arteriosclerotic cardiovascular disease)    a. cath in 4/04,-80%LAD; 70% RCA, -> DESx2; residual 60% distal RCA; nl EF  b. Nuc 02/2016 aborted due to bradycardia. Will need to be rescheduled    Carotid artery occlusion    Cerebrovascular disease    CHB (complete heart block) ([place])    a. s/p PPM on 03/24/16 with a [employer] Jude (serial number [id number]) pacemaker   Chronic kidney disease (CKD), stage III (moderate) (HCC)    Clostridium difficile colitis 03/2013   a. 03/2013   COPD (chronic obstructive pulmonary disease) ([place])    Coronary disease    Diverticulosis    GERD (gastroesophageal reflux disease)    Gout    H pylori ulcer    Hyperlipidemia    chose to stop statin, takes Zetia   Hypertension  not currently on medication   IBS (irritable bowel syndrome)    PVD (peripheral vascular disease) (HCC)    a. moderate to severely decreased ABI's in 8/08, sig. aortic inflow disease b. repeat ABI's in 2011 improved- mild disease on the left and moderate to severe disease on the right   S/P placement of cardiac pacemaker    a. 03/24/16: St. Jude (serial number [id number]) pacemaker   Symptomatic anemia 08/15/2016   TIA (transient ischemic attack) 2012   Tobacco abuse    a. 50 pack years continuing at 1/2  pack per day   Tobacco use disorder 10/15/2015    Patient Active Problem List   Diagnosis Date Noted   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[place]) 01/10/2018   Poor appetite 12/19/2017   Itching 07/19/2017   Gait instability 02/26/2017   Symptomatic anemia 08/15/2016   Protein-calorie malnutrition ([place]) 44/96/7591   Helicobacter pylori gastritis 07/26/2016   Generalized weakness 07/09/2016   Normocytic anemia 07/03/2016   CHB (complete heart block) (HCC)    COPD (chronic obstructive pulmonary disease) ([place])    Tobacco use disorder 10/15/2015   Back pain 12/30/2014   OA (osteoarthritis) 12/30/2014   Carotid stenosis 03/11/2014   PVD (peripheral vascular disease) ([place]) 03/11/2014   Colon cancer screening 02/04/2014   Overactive bladder 12/31/2013   Rash and nonspecific skin eruption 05/19/2013   Numbness and tingling-bilat foot 03/05/2013   IBS (irritable bowel syndrome) 12/30/2012   Peripheral neuropathy 07/01/2012   Insomnia 07/01/2012   Chronic kidney disease (CKD), stage III (moderate) ([place]) 08/25/2010   Cardiovascular disease 08/16/2009   Cerebrovascular disease 08/16/2009   Peripheral vascular disease ([place]) 08/16/2009   Diabetes mellitus ([place]) 10/26/2008   Hyperlipidemia 10/26/2008   Gout, unspecified 10/26/2008   Essential hypertension 10/26/2008   Asthmatic bronchitis 10/26/2008   Gastroesophageal reflux disease 10/26/2008    Past Surgical History:  Procedure Laterality Date   ABDOMINAL HYSTERECTOMY     carpel tunnel release     bilateral   CHOLECYSTECTOMY     COLONOSCOPY  2009   COLONOSCOPY N/A 08/04/2016   incomplete due to prep.    COLONOSCOPY N/A 08/05/2016   Dr. Oneida Alar: moderately redundant rectosigmoid and sigmoid, normal TI, single large-mouthed diverticulum in hepatic flexure, external hemorrhoids, path negative for microscopic colitis    ENTEROSCOPY N/A 08/18/2016   Procedure: ENTEROSCOPY;  Surgeon: Daneil Dolin, MD;  Location: [facility];  Service: Endoscopy;  Laterality: N/A;  Pediatric colonoscope   EP IMPLANTABLE DEVICE N/A 03/24/2016   Procedure: Pacemaker Implant;  Surgeon: [name], MD;  Location: [facility];  Service: Cardiovascular;  Laterality: N/A;   ESOPHAGOGASTRODUODENOSCOPY N/A 08/04/2016   Dr. Oneida Alar: non-critical Schatzki's ring, small hiatal hernia, +H.pylori gastritis on path.    GIVENS CAPSULE STUDY N/A 08/16/2016   Procedure: GIVENS CAPSULE STUDY;  Surgeon: Daneil Dolin, MD;  Location: [facility];  Service: Endoscopy;  Laterality: N/A;   TOTAL ABDOMINAL HYSTERECTOMY W/ BILATERAL SALPINGOOPHORECTOMY  1979     OB History    Gravida  2   Para  2   Term  2   Preterm      AB      Living        SAB      TAB      Ectopic      Multiple      Live Births  Home Medications    Prior to Admission medications   Medication Sig Start Date End Date Taking? Authorizing Provider  atorvastatin (LIPITOR) 10 MG tablet Take 1 tablet (10 mg total) by mouth 3 (three) times a week. 05/31/18  Yes [place], Modena Nunnery, MD  calcium citrate-vitamin D (CITRACAL+D) 315-200 MG-UNIT per tablet Take 1 tablet by mouth daily.     Yes [provider]  Cyanocobalamin (VITAMIN B 12 PO) Take 1 tablet by mouth daily.    Yes [provider]  esomeprazole (NEXIUM) 40 MG capsule TAKE 1 CAPSULE BY MOUTH DAILY BEFORE BREAKFAST. Patient taking differently: Take 40 mg by mouth daily at 12 noon.  06/12/17  Yes Mahala Menghini, PA-C  ezetimibe (ZETIA) 10 MG tablet Take 1 tablet (10 mg total) by mouth daily. 12/27/18  Yes [place], Modena Nunnery, MD  ferrous sulfate 325 (65 FE) MG tablet Take 325 mg by mouth daily with breakfast.   Yes [provider]  gabapentin (NEURONTIN) 100 MG capsule TAKE 1 TO 2 CAPSULES BY MOUTH AT BEDTIME FOR NERVE PAIN. Patient taking differently: Take 100 mg by mouth at bedtime.  12/27/18  Yes [place], Modena Nunnery, MD    hydrOXYzine (ATARAX/VISTARIL) 25 MG tablet TAKE (1) TABLET BY MOUTH EVERY (8) HOURS AS NEEDED FOR ITCHING. 07/29/18  Yes Susy Frizzle, MD  lidocaine (LIDODERM) 5 % Place 1 patch onto the skin daily. Remove & Discard patch within 12 hours or as directed by MD 06/26/18  Yes Carole Civil, MD  Multiple Vitamin (MULTIVITAMIN WITH MINERALS) TABS tablet Take 1 tablet by mouth daily. 08/05/16  Yes [place], Clanford L, MD  ondansetron (ZOFRAN ODT) 4 MG disintegrating tablet Take 1 tablet (4 mg total) by mouth every 8 (eight) hours as needed for nausea or vomiting. 01/27/19  Yes [place], Modena Nunnery, MD  albuterol (PROVENTIL HFA;VENTOLIN HFA) 108 (90 Base) MCG/ACT inhaler Inhale 2 puffs into the lungs every 4 (four) hours as needed for wheezing or shortness of breath. 07/23/15   [place], Modena Nunnery, MD  ALPRAZolam Duanne Moron) 0.25 MG tablet TAKE 1 TABLET BY MOUTH AT BEDTIME AS NEEDED. 11/04/18   Alycia Rossetti, MD  mirabegron ER (MYRBETRIQ) 25 MG TB24 tablet Take 1 tablet (25 mg total) by mouth at bedtime. For bladder Patient not taking: Reported on 01/30/2019 12/27/18   Alycia Rossetti, MD  Probiotic Product (PROBIOTIC DAILY PO) Take 1 capsule by mouth daily.     [provider]    Family History Family History  Problem Relation Age of Onset   Liver disease Mother 65   Hypertension Mother    Cerebral aneurysm Father    Aneurysm Father 81   Cancer Sister    Liver disease Brother    Liver disease Brother    Liver disease Brother    Diabetes type II Brother    Alcohol abuse Brother    Colon cancer Neg Hx     Social History Social History   Tobacco Use   Smoking status: Current Every Day Smoker    Packs/day: 1.00    Years: 50.00    Pack years: 50.00    Types: Cigarettes    Start date: 02/02/1966   Smokeless tobacco: Never Used   Tobacco comment: 1/2 pack daily  Substance Use Topics   Alcohol use: No    Alcohol/week: 0.0 standard drinks    Comment: H/O of 6 pack per  day quiting in 2003   Drug use: No     Allergies  Ace inhibitors, Aspirin, Codeine, Pineapple, Plasticized base [plastibase], Ultram [tramadol], Adhesive [tape], Naprosyn [naproxen], Biaxin [clarithromycin], Clindamycin/lincomycin, Linzess [linaclotide], Metronidazole, Nsaids, Penicillins, Varenicline tartrate, and Xarelto [rivaroxaban]   Review of Systems Review of Systems  Constitutional: Negative for chills and fever.  HENT: Negative for ear pain and sore throat.   Eyes: Negative for pain and visual disturbance.  Respiratory: Negative for cough and shortness of breath.   Cardiovascular: Positive for chest pain. Negative for palpitations.  Gastrointestinal: Positive for nausea and vomiting. Negative for abdominal pain.  Genitourinary: Negative for dysuria and hematuria.  Musculoskeletal: Negative for arthralgias and back pain.  Skin: Negative for color change and rash.  Neurological: Positive for dizziness and syncope. Negative for seizures, light-headedness and headaches.  All other systems reviewed and are negative.    Physical Exam Updated Vital Signs BP (!) 146/76    Pulse 68    Temp 98.4 F (36.9 C) (Oral)    Resp 14    SpO2 98%   Physical Exam Vitals signs and nursing note reviewed.  Constitutional:      General: She is not in acute distress.    Appearance: She is well-developed.     Comments: Chronically ill-appearing.  HENT:     Head: Normocephalic and atraumatic.     Mouth/Throat:     Mouth: Mucous membranes are dry.     Pharynx: No oropharyngeal exudate.     Comments: Oropharynx appears dry, tongue appears pale. Eyes:     Pupils: Pupils are equal, round, and reactive to light.  Neck:     Musculoskeletal: Normal range of motion.  Cardiovascular:     Rate and Rhythm: Regular rhythm. Bradycardia present.     Heart sounds: Normal heart sounds.  Pulmonary:     Effort: Pulmonary effort is normal. No respiratory distress.     Breath sounds: Normal breath  sounds. No wheezing, rhonchi or rales.     Comments: Lungs are slightly diminished to auscultation.  No wheezing, rhonchi, rales. Abdominal:     General: Bowel sounds are normal. There is no distension.     Palpations: Abdomen is soft.     Tenderness: There is no abdominal tenderness.     Comments: Abdomen is soft without tenderness.  No guarding or rebound to my exam.  Musculoskeletal:        General: No tenderness or deformity.     Right lower leg: No edema.     Left lower leg: No edema.  Skin:    General: Skin is warm and dry.  Neurological:     Mental Status: She is alert and oriented to person, place, and time.     Comments: Alert, oriented, thought content appropriate intermittently speech fluent without evidence of aphasia. Able to follow 2 step commands without difficulty.  Cranial Nerves:   II:  Peripheral visual fields grossly normal, pupils, round, reactive to light III,IV, VI: ptosis not present, extra-ocular motions intact bilaterally  V,VII: smile symmetric, facial light touch sensation equal VIII: hearing grossly normal bilaterally  IX,X: midline uvula rise  XI: bilateral shoulder shrug equal and strong XII: midline tongue extension  Motor:  5/5 in upper and lower extremities bilaterally including strong and equal grip strength and dorsiflexion/plantar flexion Sensory: light touch normal in all extremities.  Cerebellar: normal finger-to-nose with bilateral upper extremities, pronator drift negative      ED Treatments / Results  Labs (all labs ordered are listed, but only abnormal results are displayed) Labs Reviewed  BASIC METABOLIC PANEL -  Abnormal; Notable for the following components:      Result Value   Glucose, Bld 115 (*)    Creatinine, Ser 1.08 (*)    GFR calc non Af Amer 49 (*)    GFR calc Af Amer 57 (*)    All other components within normal limits  CBC - Abnormal; Notable for the following components:   RBC 3.32 (*)    Hemoglobin 9.9 (*)    HCT  31.2 (*)    All other components within normal limits  SARS CORONAVIRUS 2 (TAT 6-12 HRS)  SARS CORONAVIRUS 2 (HOSPITAL ORDER, PERFORMED IN [facility])  HEPATIC FUNCTION PANEL  TSH  URINALYSIS, ROUTINE W REFLEX MICROSCOPIC  RAPID URINE DRUG SCREEN, HOSP PERFORMED  TROPONIN I (HIGH SENSITIVITY)  TROPONIN I (HIGH SENSITIVITY)    EKG EKG Interpretation  Date/Time:  Thursday January 30 2019 15:42:59 EDT Ventricular Rate:  63 PR Interval:  196 QRS Duration: 168 QT Interval:  484 QTC Calculation: 495 R Axis:   -72 Text Interpretation:  AV dual-paced rhythm Abnormal ECG since last tracing no significant change Confirmed by Noemi Chapel [phone]) on 01/30/2019 3:48:25 PM   Radiology Dg Chest 2 View  Result Date: 01/30/2019 CLINICAL DATA:  Chest pain, syncope EXAM: CHEST - 2 VIEW COMPARISON:  08/15/2016 FINDINGS: The heart size and mediastinal contours are within normal limits. Left chest multi lead pacer. Both lungs are clear. Disc degenerative disease of the thoracic spine. IMPRESSION: No acute abnormality of the lungs. Electronically Signed   By: Eddie Candle M.D.   On: 01/30/2019 16:21    Procedures Procedures (including critical care time)  Medications Ordered in ED Medications  sodium chloride flush (NS) 0.9 % injection 3 mL (has no administration in time range)  sodium chloride 0.9 % bolus 500 mL (has no administration in time range)     Initial Impression / Assessment and Plan / ED Course  I have reviewed the triage vital signs and the nursing notes.  Pertinent labs & imaging results that were available during my care of the patient were reviewed by me and considered in my medical decision making (see chart for details).     Patient with an extensive past medical history presents to the ED with complaints of dizziness, chest pain, decrease in p.o. intake.  According to patient's chart which I have extensively review, patient did call her PCP about these  complaints this past week, was encouraged to take Zofran along with continue to hydrate and drink her ensures.  According to which I have review patient does currently live alone, she does not have any help while at home.  She did endorse multiple episodes of syncope, will interrogate her pacemaker to obtain further information. She also endorses dizziness, states this feels worse with movement along with changes in position, she does not have a history of vertigo. Patient was seen by PCP on January 28, 2019, she was given 600 cc of IV fluid in the office.  Labs were within normal limits then, she was given Zofran as needed.  According to a note written by her PCP Dr. Buelah Manis, patient continues to be followed by heme-onc, did have a repeat CT chest, she does have progressive weight loss nausea and weakness, this could all be chronic from her previous history of CA. Patient did ask for food during arrival in the ED, she did have food without any episodes of emesis during her visit. She denies any sick contacts, reports her kids  are the ones that visit her in the home, they have not been social distancing, none of them have tested positive for COVID-19.  CBC showed no leukocytosis, hemoglobin slightly decreased from her previous visit 3 days ago.  BMP showed no electrolyte derangement, creatinine level is slightly elevated compared to her previous visit.  Troponin continues to worsen.  A TSH was checked which was normal in nature.  Patient continues to endorse dizziness, head sensation of spinning, will obtain head CT as patient history does not really correlate as well with her visit.  According to note on her chart written by her PCP patient's daughter did try to obtain some resources in order to have patient placed in an assisted living facility but due to financial strain they were unable to do so.  Vitals have been within normal limits, she is afebrile, no hypoxia, no tachycardia and has a normotensive  pressure.  Abdominal appears soft, nontender without any focal point of tenderness.  UA without any nitrites, leukocytes, white blood cell count.  UDS showed no benzos, cocaine, THC or barbiturates.  9:43 PM spoke to Dr. Maudie Mercury who advised COVID-19 rapid test must be obtained prior to appropriate bed placement.  Patient will be admitted at this time for failure to thrive.  CT head is currently pending.  CT head without any acute infarct.  Portions of this note were generated with [employer]. Dictation errors may occur despite best attempts at proofreading.  Final Clinical Impressions(s) / ED Diagnoses   Final diagnoses:  Adult failure to thrive    ED Discharge Orders    None       Janeece Fitting, PA-C 01/30/19 2143    Janeece Fitting, PA-C 01/30/19 2148    Noemi Chapel, MD 01/31/19 [phone]

## 2019-01-30 NOTE — Telephone Encounter (Signed)
Call placed to patient and patient daughter made aware.   Patient states that she is going to try to eat some chicken & dumplings and if she doesn't feel better after that, she will go to ER.

## 2019-01-30 NOTE — Telephone Encounter (Signed)
If she still isnt eating or drinking then she really need to go to ER Have her take the zofran see if that helps  Also thank you for the flowers

## 2019-01-30 NOTE — Telephone Encounter (Signed)
Received call from patient daughter, Anderson Malta.   Reports that patient is voicing C/O increased dizziness. States that pt reports room is spinning.   States that patient has not had much to drink today. Requested MD to advise.   Advised to continue to try to push fluids and ensure.

## 2019-01-30 NOTE — ED Notes (Signed)
Dr. Maudie Mercury is made aware that pt is refusing any more attempts for IV access

## 2019-01-30 NOTE — H&P (Signed)
TRH H&P    Patient Demographics:    Charlotte Henry, is a 78 y.o. female  MRN: [id number]  DOB - August 05, 1940  Admit Date - 01/30/2019  Referring MD/NP/PA:  Janeece Fitting  Outpatient Primary MD for the patient is [place], Modena Nunnery, MD Cristopher Peru -cardiology  Patient coming from:  home  Chief complaint-   vertigo   HPI:    Charlotte Henry  is a 78 y.o. 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 .  Pt denies headache, syncope, focal numbness, tingling, weakness, seizure activity.  Pt denies hearing loss, vertigo,   In Ed,  T 98.4, P 60 R 16, Bp 112/66 pox 100% on RA  Na 139, K 3.8, Bun 17, Creatinine 1.08 Wbc 5.9, hgb 9.9, Plt 236  Trop 3 Tsh 1.518  CT brain  IMPRESSION: Atrophy, chronic microvascular disease.  No acute intracranial abnormality.  CXR IMPRESSION: No acute abnormality of the lungs.  Ekg paced at 13  Pt will be admitted for vertigo.         Review of systems:    In addition to the HPI above,  No Fever-chills,   No Headache, No changes with Vision or hearing, No problems swallowing food or Liquids, No Chest pain, Cough or Shortness of Breath, No Abdominal pain, No Nausea or Vomiting, bowel movements are regular, No Blood in stool or Urine, No dysuria, No new skin rashes or bruises, No new joints pains-aches,  No new weakness, tingling, numbness in any extremity, No recent weight gain or loss, No polyuria, polydypsia or polyphagia, No significant Mental Stressors.  All other systems reviewed and are negative.    Past History of the following :    Past Medical History:  Diagnosis Date   Arterial  fibromuscular dysplasia (HCC)    ASCVD (arteriosclerotic cardiovascular disease)    a. cath in 4/04,-80%LAD; 70% RCA, -> DESx2; residual 60% distal RCA; nl EF  b. Nuc 02/2016 aborted due to bradycardia. Will need to be rescheduled    Carotid artery occlusion    Cerebrovascular disease    CHB (complete heart block) ([place])    a. s/p PPM on 03/24/16 with a St. Jude (serial number [id number]) pacemaker   Chronic kidney disease (CKD), stage III (moderate) (HCC)    Clostridium difficile colitis 03/2013   a. 03/2013   COPD (chronic obstructive pulmonary disease) ([place])    Coronary disease    Diverticulosis    GERD (gastroesophageal reflux disease)    Gout    H pylori ulcer    Hyperlipidemia    chose to stop statin, takes Zetia   Hypertension    not currently on medication   IBS (irritable bowel syndrome)    PVD (peripheral vascular disease) (HCC)    a. moderate to severely decreased ABI's in  8/08, sig. aortic inflow disease b. repeat ABI's in 2011 improved- mild disease on the left and moderate to severe disease on the right   S/P placement of cardiac pacemaker    a. 03/24/16: St. Jude (serial number [id number]) pacemaker   Symptomatic anemia 08/15/2016   TIA (transient ischemic attack) 2012   Tobacco abuse    a. 50 pack years continuing at 1/2 pack per day   Tobacco use disorder 10/15/2015      Past Surgical History:  Procedure Laterality Date   ABDOMINAL HYSTERECTOMY     carpel tunnel release     bilateral   CHOLECYSTECTOMY     COLONOSCOPY  2009   COLONOSCOPY N/A 08/04/2016   incomplete due to prep.    COLONOSCOPY N/A 08/05/2016   Dr. Oneida Alar: moderately redundant rectosigmoid and sigmoid, normal TI, single large-mouthed diverticulum in hepatic flexure, external hemorrhoids, path negative for microscopic colitis    ENTEROSCOPY N/A 08/18/2016   Procedure: ENTEROSCOPY;  Surgeon: Daneil Dolin, MD;  [facility];  Service: Endoscopy;  Laterality: N/A;   Pediatric colonoscope   EP IMPLANTABLE DEVICE N/A 03/24/2016   Procedure: Pacemaker Implant;  Surgeon: [name], MD;  Location: [facility];  Service: Cardiovascular;  Laterality: N/A;   ESOPHAGOGASTRODUODENOSCOPY N/A 08/04/2016   Dr. Oneida Alar: non-critical Schatzki's ring, small hiatal hernia, +H.pylori gastritis on path.    GIVENS CAPSULE STUDY N/A 08/16/2016   Procedure: GIVENS CAPSULE STUDY;  Surgeon: Daneil Dolin, MD;  [facility];  Service: Endoscopy;  Laterality: N/A;   TOTAL ABDOMINAL HYSTERECTOMY W/ BILATERAL SALPINGOOPHORECTOMY  1979      Social History:      Social History   Tobacco Use   Smoking status: Current Every Day Smoker    Packs/day: 1.00    Years: 50.00    Pack years: 50.00    Types: Cigarettes    Start date: 02/02/1966   Smokeless tobacco: Never Used   Tobacco comment: 1/2 pack daily  Substance Use Topics   Alcohol use: No    Alcohol/week: 0.0 standard drinks    Comment: H/O of 6 pack per day quiting in 2003       Family History :     Family History  Problem Relation Age of Onset   Liver disease Mother 77   Hypertension Mother    Cerebral aneurysm Father    Aneurysm Father 83   Cancer Sister    Liver disease Brother    Liver disease Brother    Liver disease Brother    Diabetes type II Brother    Alcohol abuse Brother    Colon cancer Neg Hx        Home Medications:   Prior to Admission medications   Medication Sig Start Date End Date Taking? Authorizing Provider  atorvastatin (LIPITOR) 10 MG tablet Take 1 tablet (10 mg total) by mouth 3 (three) times a week. 05/31/18  Yes [place], Modena Nunnery, MD  calcium citrate-vitamin D (CITRACAL+D) 315-200 MG-UNIT per tablet Take 1 tablet by mouth daily.     Yes [provider]  Cyanocobalamin (VITAMIN B 12 PO) Take 1 tablet by mouth daily.    Yes [provider]  esomeprazole (NEXIUM) 40 MG capsule TAKE 1 CAPSULE BY MOUTH DAILY BEFORE  BREAKFAST. Patient taking differently: Take 40 mg by mouth daily at 12 noon.  06/12/17  Yes Mahala Menghini, PA-C  ezetimibe (ZETIA) 10 MG tablet Take 1 tablet (10 mg total) by mouth  daily. 12/27/18  Yes [place], Modena Nunnery, MD  ferrous sulfate 325 (65 FE) MG tablet Take 325 mg by mouth daily with breakfast.   Yes [provider]  gabapentin (NEURONTIN) 100 MG capsule TAKE 1 TO 2 CAPSULES BY MOUTH AT BEDTIME FOR NERVE PAIN. Patient taking differently: Take 100 mg by mouth at bedtime.  12/27/18  Yes [place], Modena Nunnery, MD  hydrOXYzine (ATARAX/VISTARIL) 25 MG tablet TAKE (1) TABLET BY MOUTH EVERY (8) HOURS AS NEEDED FOR ITCHING. 07/29/18  Yes Susy Frizzle, MD  lidocaine (LIDODERM) 5 % Place 1 patch onto the skin daily. Remove & Discard patch within 12 hours or as directed by MD 06/26/18  Yes Carole Civil, MD  Multiple Vitamin (MULTIVITAMIN WITH MINERALS) TABS tablet Take 1 tablet by mouth daily. 08/05/16  Yes Johnson, Clanford L, MD  ondansetron (ZOFRAN ODT) 4 MG disintegrating tablet Take 1 tablet (4 mg total) by mouth every 8 (eight) hours as needed for nausea or vomiting. 01/27/19  Yes [place], Modena Nunnery, MD  albuterol (PROVENTIL HFA;VENTOLIN HFA) 108 (90 Base) MCG/ACT inhaler Inhale 2 puffs into the lungs every 4 (four) hours as needed for wheezing or shortness of breath. 07/23/15   [place], Modena Nunnery, MD  ALPRAZolam Duanne Moron) 0.25 MG tablet TAKE 1 TABLET BY MOUTH AT BEDTIME AS NEEDED. 11/04/18   Alycia Rossetti, MD  mirabegron ER (MYRBETRIQ) 25 MG TB24 tablet Take 1 tablet (25 mg total) by mouth at bedtime. For bladder Patient not taking: Reported on 01/30/2019 12/27/18   Alycia Rossetti, MD  Probiotic Product (PROBIOTIC DAILY PO) Take 1 capsule by mouth daily.     [provider]     Allergies:     Allergies  Allergen Reactions   Ace Inhibitors Hives, Shortness Of Breath and Swelling   Aspirin Other (See Comments)    Blood in stool and nose bleed   Codeine Swelling    Pineapple Shortness Of Breath and Swelling   Plasticized Base [Plastibase] Hives and Swelling    No plastics   Ultram [Tramadol] Swelling   Adhesive [Tape] Swelling and Rash   Naprosyn [Naproxen] Swelling   Biaxin [Clarithromycin] Nausea And Vomiting and Other (See Comments)    Dizziness.   Clindamycin/Lincomycin Other (See Comments)    Burning sensation throat, abdomen   Linzess [Linaclotide]     DIARRHEA, FECAL INCONTINENCE   Metronidazole     NAUSEA AND WEAKNESS   Nsaids    Penicillins     Lip swelling, Hives Has patient had a PCN reaction causing immediate rash, facial/tongue/throat swelling, SOB or lightheadedness with hypotension:YES Has patient had a PCN reaction causing severe rash involving mucus membranes or skin necrosis: NO Has patient had a PCN reaction that required hospitalization NO Has patient had a PCN reaction occurring within the last 10 years: NO If all of the above answers are "NO", then may proceed with Cephalosporin use.   Varenicline Tartrate Swelling   Xarelto [Rivaroxaban]     Bleeding     Physical Exam:   Vitals  Blood pressure 134/67, pulse (!) 59, temperature 98.4 F (36.9 C), temperature source Oral, resp. rate 16, SpO2 98 %.  1.  General: axoxo3  2. Psychiatric: euthymic  3. Neurologic: cn2-12 intact, reflexes 2+ symmetric, diffuse with no clonus, motor 5/5 in all 4 ext  4. HEENMT:  Anicteric, pupils 1.74mm symmetric, direct, consensual, near intact Neck: no jvd, bruit  5. Respiratory : CTAB  6. Cardiovascular : rrr s1, s2,  1/6 sem  rusb  7. Gastrointestinal:  Abd: soft, nt, nd, +bs  8. Skin:  Ext: no c/c/e,  No rash  9.Musculoskeletal:  Good ROM,  No adenopathy    Data Review:    CBC Recent Labs  Lab 01/27/19 1823 01/30/19 1632  WBC 6.7 5.9  HGB 10.6* 9.9*  HCT 33.6* 31.2*  PLT 229 236  MCV 93.6 94.0  MCH 29.5 29.8  MCHC 31.5 31.7  RDW 13.6 13.7    ------------------------------------------------------------------------------------------------------------------  Results for orders placed or performed during the hospital encounter of 01/30/19 (from the past 48 hour(s))  Basic metabolic panel     Status: Abnormal   Collection Time: 01/30/19  4:32 PM  Result Value Ref Range   Sodium 139 135 - 145 mmol/L   Potassium 3.8 3.5 - 5.1 mmol/L   Chloride 108 98 - 111 mmol/L   CO2 24 22 - 32 mmol/L   Glucose, Bld 115 (H) 70 - 99 mg/dL   BUN 17 8 - 23 mg/dL   Creatinine, Ser 1.08 (H) 0.44 - 1.00 mg/dL   Calcium 9.3 8.9 - 10.3 mg/dL   GFR calc non Af Amer 49 (L) >60 mL/min   GFR calc Af Amer 57 (L) >60 mL/min   Anion gap 7 5 - 15    Comment: Performed at [facility], [address] [place], [place] [address]  CBC     Status: Abnormal   Collection Time: 01/30/19  4:32 PM  Result Value Ref Range   WBC 5.9 4.0 - 10.5 K/uL   RBC 3.32 (L) 3.87 - 5.11 MIL/uL   Hemoglobin 9.9 (L) 12.0 - 15.0 g/dL   HCT 31.2 (L) 36.0 - 46.0 %   MCV 94.0 80.0 - 100.0 fL   MCH 29.8 26.0 - 34.0 pg   MCHC 31.7 30.0 - 36.0 g/dL   RDW 13.7 11.5 - 15.5 %   Platelets 236 150 - 400 K/uL   nRBC 0.0 0.0 - 0.2 %    Comment: Performed at [facility], [address], [place], [place] [address]  Troponin I (High Sensitivity)     Status: None   Collection Time: 01/30/19  4:32 PM  Result Value Ref Range   Troponin I (High Sensitivity) 3 <18 ng/L    Comment: (NOTE) Elevated high sensitivity troponin I (hsTnI) values and significant  changes across serial measurements may suggest ACS but many other  chronic and acute conditions are known to elevate hsTnI results.  Refer to the "Links" section for chest pain algorithms and additional  guidance. Performed at [facility], [address], [place], [place] [address]   TSH     Status: None   Collection Time: 01/30/19  6:26 PM  Result Value Ref Range   TSH 1.518 0.350 - 4.500 uIU/mL    Comment: Performed by a 3rd  Generation assay with a functional sensitivity of <=0.01 uIU/mL. Performed at [facility], [address], [place], [place] [address]   Troponin I (High Sensitivity)     Status: None   Collection Time: 01/30/19  6:29 PM  Result Value Ref Range   Troponin I (High Sensitivity) 3 <18 ng/L    Comment: (NOTE) Elevated high sensitivity troponin I (hsTnI) values and significant  changes across serial measurements may suggest ACS but many other  chronic and acute conditions are known to elevate hsTnI results.  Refer to the "Links" section for chest pain algorithms and additional  guidance. Performed at [facility], [address] [place], [place]  [address]   Hepatic function panel     Status: None   Collection Time: 01/30/19  6:29 PM  Result Value Ref Range   Total Protein 7.0 6.5 - 8.1 g/dL   Albumin 3.8 3.5 - 5.0 g/dL   AST 19 15 - 41 U/L   ALT 12 0 - 44 U/L   Alkaline Phosphatase 43 38 - 126 U/L   Total Bilirubin 0.6 0.3 - 1.2 mg/dL   Bilirubin, Direct 0.1 0.0 - 0.2 mg/dL   Indirect Bilirubin 0.5 0.3 - 0.9 mg/dL    Comment: Performed at [facility], [address] [place], [place] [address]  Urinalysis, Routine w reflex microscopic     Status: None   Collection Time: 01/30/19  8:09 PM  Result Value Ref Range   Color, Urine YELLOW YELLOW   APPearance CLEAR CLEAR   Specific Gravity, Urine 1.012 1.005 - 1.030   pH 5.0 5.0 - 8.0   Glucose, UA NEGATIVE NEGATIVE mg/dL   Hgb urine dipstick NEGATIVE NEGATIVE   Bilirubin Urine NEGATIVE NEGATIVE   Ketones, ur NEGATIVE NEGATIVE mg/dL   Protein, ur NEGATIVE NEGATIVE mg/dL   Nitrite NEGATIVE NEGATIVE   Leukocytes,Ua NEGATIVE NEGATIVE    Comment: Performed at [facility], [address], [place], [place] [address]  Rapid urine drug screen (hospital performed)     Status: None   Collection Time: 01/30/19  8:09 PM  Result Value Ref Range   Opiates NONE DETECTED NONE DETECTED   Cocaine NONE DETECTED NONE DETECTED   Benzodiazepines NONE  DETECTED NONE DETECTED   Amphetamines NONE DETECTED NONE DETECTED   Tetrahydrocannabinol NONE DETECTED NONE DETECTED   Barbiturates NONE DETECTED NONE DETECTED    Comment: (NOTE) DRUG SCREEN FOR MEDICAL PURPOSES ONLY.  IF CONFIRMATION IS NEEDED FOR ANY PURPOSE, NOTIFY LAB WITHIN 5 DAYS. LOWEST DETECTABLE LIMITS FOR URINE DRUG SCREEN Drug Class                     Cutoff (ng/mL) Amphetamine and metabolites    1000 Barbiturate and metabolites    200 Benzodiazepine                 858 Tricyclics and metabolites     300 Opiates and metabolites        300 Cocaine and metabolites        300 THC                            50 Performed at [facility], [address], [place], [place] [address]     Chemistries  Recent Labs  Lab 01/27/19 1823 01/30/19 1632 01/30/19 1829  NA 137 139  --   K 4.3 3.8  --   CL 106 108  --   CO2 24 24  --   GLUCOSE 124* 115*  --   BUN 24* 17  --   CREATININE 1.00 1.08*  --   CALCIUM 9.4 9.3  --   AST 22  --  19  ALT 13  --  12  ALKPHOS 44  --  43  BILITOT 0.5  --  0.6   ------------------------------------------------------------------------------------------------------------------  ------------------------------------------------------------------------------------------------------------------ GFR: Estimated Creatinine Clearance: 41.1 mL/min (A) (by C-G formula based on SCr of 1.08 mg/dL (H)). Liver Function Tests: Recent Labs  Lab 01/27/19 1823 01/30/19 1829  AST 22 19  ALT 13 12  ALKPHOS 44 43  BILITOT 0.5 0.6  PROT 7.2 7.0  ALBUMIN 3.9 3.8   Recent Labs  Lab 01/27/19 1823  LIPASE 18   No results for input(s): AMMONIA in the last 168 hours. Coagulation Profile: No results for input(s): INR, PROTIME in the last 168 hours. Cardiac Enzymes: No results for input(s): CKTOTAL, CKMB, CKMBINDEX, TROPONINI in the last 168 hours. BNP (last 3 results) No results for input(s): PROBNP in the last 8760 hours. HbA1C: No results for  input(s): HGBA1C in the last 72 hours. CBG: No results for input(s): GLUCAP in the last 168 hours. Lipid Profile: No results for input(s): CHOL, HDL, LDLCALC, TRIG, CHOLHDL, LDLDIRECT in the last 72 hours. Thyroid Function Tests: Recent Labs    01/30/19 1826  TSH 1.518   Anemia Panel: No results for input(s): VITAMINB12, FOLATE, FERRITIN, TIBC, IRON, RETICCTPCT in the last 72 hours.  --------------------------------------------------------------------------------------------------------------- Urine analysis:    Component Value Date/Time   COLORURINE YELLOW 01/30/2019 2009   APPEARANCEUR CLEAR 01/30/2019 2009   LABSPEC 1.012 01/30/2019 2009   PHURINE 5.0 01/30/2019 2009   GLUCOSEU NEGATIVE 01/30/2019 2009   HGBUR NEGATIVE 01/30/2019 2009   BILIRUBINUR NEGATIVE 01/30/2019 2009   KETONESUR NEGATIVE 01/30/2019 2009   PROTEINUR NEGATIVE 01/30/2019 2009   UROBILINOGEN 2 (H) 12/21/2014 1455   NITRITE NEGATIVE 01/30/2019 2009   LEUKOCYTESUR NEGATIVE 01/30/2019 2009      Imaging Results:    Dg Chest 2 View  Result Date: 01/30/2019 CLINICAL DATA:  Chest pain, syncope EXAM: CHEST - 2 VIEW COMPARISON:  08/15/2016 FINDINGS: The heart size and mediastinal contours are within normal limits. Left chest multi lead pacer. Both lungs are clear. Disc degenerative disease of the thoracic spine. IMPRESSION: No acute abnormality of the lungs. Electronically Signed   By: Eddie Candle M.D.   On: 01/30/2019 16:21   Ct Head Wo Contrast  Result Date: 01/30/2019 CLINICAL DATA:  Chest pain, dizziness EXAM: CT HEAD WITHOUT CONTRAST TECHNIQUE: Contiguous axial images were obtained from the base of the skull through the vertex without intravenous contrast. COMPARISON:  12/09/2009 FINDINGS: Brain: There is atrophy and chronic small vessel disease changes. No acute intracranial abnormality. Specifically, no hemorrhage, hydrocephalus, mass lesion, acute infarction, or significant intracranial injury. Vascular:  No hyperdense vessel or unexpected calcification. Skull: No acute calvarial abnormality. Sinuses/Orbits: Visualized paranasal sinuses and mastoids clear. Orbital soft tissues unremarkable. Other: None IMPRESSION: Atrophy, chronic microvascular disease. No acute intracranial abnormality. Electronically Signed   By: Rolm Baptise M.D.   On: 01/30/2019 21:41       Assessment & Plan:    Principal Problem:   Vertigo Active Problems:   Hyperlipidemia   Essential hypertension   Peripheral vascular disease (HCC)   CHB (complete heart block) (HCC)   COPD (chronic obstructive pulmonary disease) (HCC)   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HCC)  Vertigo Tele Orthostatic hypotension pending Unable to do MRI brain due to pacer Meclizine 25mg  po q6h prn  PT to evaluate and tx,   Hyperlipidemia Cont Zetia, Lipitor 10mg  po qhs  Hypertension in past medical history but not on medication ? Observer bp  ? Cardiac murmer Check cardiac echo  Anxiety Cont Xanax 0.25mg  po qday prn   Copd Cont Albuterol HFA  Gerd Cont PPI  Anemia Cont ferrous sulfate 325mg  po qday  Urinary incontinence Cont Myrbetriq 25mg  po qday   DVT Prophylaxis-   Lovenox - SCDs   AM Labs Ordered, also please review Full Orders  Family Communication: Admission, patients condition and plan of care including tests being ordered  have been discussed with the patient  who indicate understanding and agree with the plan and Code Status.  Code Status:  FULL CODE,  Pt is accompanied by family member  ? Daughter,  Also notified son that   Admission status: Observation: Based on patients clinical presentation and evaluation of above clinical data, I have made determination that patient meets observation criteria at this time    Time spent in minutes : 55 minutes   Jani Gravel M.D on 01/30/2019 at 10:20 PM

## 2019-01-30 NOTE — ED Notes (Signed)
EKG completed in Triage

## 2019-01-30 NOTE — ED Notes (Signed)
Attempted IV access x 1, site blew.  Pt refused for this nurse to attempt again.  Another nurse in to try for IV access.

## 2019-01-30 NOTE — ED Triage Notes (Signed)
Patient c/o chest pain, dizziness, arm pain, nausea and vomiting, and episodes of syncope. Symptoms since Tuesday.

## 2019-01-31 ENCOUNTER — Observation Stay (HOSPITAL_BASED_OUTPATIENT_CLINIC_OR_DEPARTMENT_OTHER): Payer: Medicare HMO

## 2019-01-31 DIAGNOSIS — I361 Nonrheumatic tricuspid (valve) insufficiency: Secondary | ICD-10-CM

## 2019-01-31 DIAGNOSIS — R42 Dizziness and giddiness: Secondary | ICD-10-CM | POA: Diagnosis not present

## 2019-01-31 LAB — COMPREHENSIVE METABOLIC PANEL
ALT: 12 U/L (ref 0–44)
AST: 21 U/L (ref 15–41)
Albumin: 3.9 g/dL (ref 3.5–5.0)
Alkaline Phosphatase: 53 U/L (ref 38–126)
Anion gap: 8 (ref 5–15)
BUN: 16 mg/dL (ref 8–23)
CO2: 25 mmol/L (ref 22–32)
Calcium: 9.7 mg/dL (ref 8.9–10.3)
Chloride: 107 mmol/L (ref 98–111)
Creatinine, Ser: 0.89 mg/dL (ref 0.44–1.00)
GFR calc Af Amer: >60 mL/min (ref >60)
GFR calc non Af Amer: >60 mL/min (ref >60)
Glucose, Bld: 116 mg/dL — ABNORMAL HIGH (ref 70–99)
Potassium: 4 mmol/L (ref 3.5–5.1)
Sodium: 140 mmol/L (ref 135–145)
Total Bilirubin: 0.5 mg/dL (ref 0.3–1.2)
Total Protein: 7.1 g/dL (ref 6.5–8.1)

## 2019-01-31 LAB — CBC
HCT: 33.9 % — ABNORMAL LOW (ref 36.0–46.0)
Hemoglobin: 10.9 g/dL — ABNORMAL LOW (ref 12.0–15.0)
MCH: 29.7 pg (ref 26.0–34.0)
MCHC: 32.2 g/dL (ref 30.0–36.0)
MCV: 92.4 fL (ref 80.0–100.0)
Platelets: 236 10*3/uL (ref 150–400)
RBC: 3.67 MIL/uL — ABNORMAL LOW (ref 3.87–5.11)
RDW: 13.5 % (ref 11.5–15.5)
WBC: 7.5 10*3/uL (ref 4.0–10.5)
nRBC: 0 % (ref 0.0–0.2)

## 2019-01-31 LAB — ECHOCARDIOGRAM COMPLETE
Height: 67 in
Weight: 2352.75 oz

## 2019-01-31 LAB — SARS CORONAVIRUS 2 (TAT 6-24 HRS): SARS Coronavirus 2: NEGATIVE

## 2019-01-31 MED ORDER — MECLIZINE HCL 25 MG PO TABS: 25.0000 mg | ORAL_TABLET | Freq: Four times a day (QID) | ORAL | 0 refills | Status: DC | PRN

## 2019-01-31 NOTE — TOC Progression Note (Signed)
01/31/2019 Medicare Home health results [email or web address]&lat=[phone]&lng=-[phone] 1/4 Home health agencies that serve [place], [place]. Choose up to 3 home health agencies to compare. So far you have none selected. Print all results Home health results [place] Results Viewing 1 - 14 of 14 results [place] of Patient Care Rating Patient Survey Summary Rating [facility] [phone] Add to my [place] [phone] Add to my [place] [phone] Add to my [place] [phone] Add to my Favorites 4 out of 5 stars 4 out of 5 stars 3 out of 5 stars 5 out of 5 stars 3 out of 5 stars 4 out of 5 stars 3  out of 5 stars 4 out of 5 stars 01/31/2019 Medicare Home health results [email or web address]&lat=[phone]&lng=-[phone] 2/4 Home Health Agency Information Quality of Patient Care Rating Patient Survey Summary Rating [facility] [phone] Add to my [place] [phone] Add to my [place] [phone] Add to my [place] [phone] Add to my [place] AGE [phone] Add to my [place] [phone] Add to my [place] [phone] Add to my Favorites 4  out of 5 stars 3 out of 5 stars 4 out of 5 stars 4 out of 5 stars 4 out of 5 stars 4 out of 5 stars 4 out of 5 stars 4 out of 5 stars 3 out of 5 stars 3 out of 5 stars 3  out of 5 stars 4 out of 5 stars 3 out of 5 stars 4 out of 5 stars 01/31/2019 Medicare Home health results [email or web address]&lat=[phone]&lng=-[phone] 3/4 Modify your search Location Home health agencies that serve: ZIP code or [place], [place], [place] a [place] Agency name Full or partial name Filter by: Clear all filters Quality of patient care rating Viewing 1 - 14 of 14 results [place] of Patient Care Rating Patient Survey Summary Rating [facility] [phone] Add to my [place] [phone] Add to my [place] [phone] Add to my Favorites 3  out of 5 stars 3 out of 5 stars 3  out of 5 stars 11 4  out of 5 stars 3 out of 5 stars 01/31/2019 Medicare Home health results [email or web address]&lat=[phone]&lng=-[phone] 4/4 A federal government website managed and paid for by the [employer] & Medicaid Services. y p g Learn more about these ratings 5 stars (0) 4 ? 4.5 stars (6) 3 ? 3.5 stars (8) 2 ? 2.5 stars (0) 1 ? 1.5 stars (0) Patient survey summary ratings Learn more about these ratings (1) (8) (4) (0) (0) If footnotes appear in the table, hover over the number to get more details. View more footnote details. Rating: 5 out of 5 Rating: 4 out of 5 Rating: 3 out of 5 Rating: 2 out of 5 Rating: 1 out of 5

## 2019-01-31 NOTE — Evaluation (Signed)
Physical Therapy Evaluation Patient Details Name: Charlotte Henry MRN: [id number] DOB: 1940/12/29 Today's Date: 01/31/2019   History of Present Illness  Charlotte Henry  is a 78 y.o. 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 .  Pt denies headache, syncope, focal numbness, tingling, weakness, seizure activity.  Pt denies hearing loss, vertigo,    Clinical Impression  Patient functioning near baseline for functional mobility and gait other than slower than normal cadence during ambulation without loss of balance, limited mostly due to c/o fatigue, demonstrates good return for transferring to commode in bathroom and walking in room and hallway.  Plan:  Patient discharged from physical therapy to care of nursing for ambulation daily as tolerated for length of stay.     Follow Up Recommendations Home health PT    Equipment Recommendations  None recommended by PT    Recommendations for Other Services       Precautions / Restrictions Precautions Precautions: None Restrictions Weight Bearing Restrictions: No      Mobility  Bed Mobility Overal bed mobility: Modified Independent             General bed mobility comments: increased time  Transfers Overall transfer level: Modified independent Equipment used: Straight cane;None             General transfer comment: increased time  Ambulation/Gait Ambulation/Gait assistance: Modified independent (Device/Increase time) Gait Distance (Feet): 55 Feet Assistive device: Straight cane Gait Pattern/deviations: WFL(Within Functional Limits);Decreased step length - right;Decreased step length - left;Decreased stride length;Step-through pattern Gait velocity: decreased    General Gait Details: grossly WFL with good return for ambulation in room and hallway without loss of balance using SPC with mostly step-through pattern  Stairs            Wheelchair Mobility    Modified Rankin (Stroke Patients Only)       Balance Overall balance assessment: Needs assistance Sitting-balance support: Feet supported;No upper extremity supported Sitting balance-Leahy Scale: Good Sitting balance - Comments: seated at bedside and on commode in bathroom   Standing balance support: During functional activity;No upper extremity supported Standing balance-Leahy Scale: Fair Standing balance comment: fair/good using SPC                             Pertinent Vitals/Pain Pain Assessment: No/denies pain    Home Living Family/patient expects to be discharged to:: Private residence Living Arrangements: Alone Available Help at Discharge: Family;Available PRN/intermittently Type of Home: House Home Access: Stairs to enter Entrance Stairs-Rails: Right Entrance Stairs-Number of Steps: 2 Home Layout: One level Home Equipment: Toilet riser;Cane - single point;Walker - 4 wheels;Other (comment)(cane has a tripod tip)      Prior Function Level of Independence: Independent with assistive device(s)         Comments: Household and short distanced community with tri-pod cane most of time, occasionally uses Rollator     Hand Dominance   Dominant Hand: Right    Extremity/Trunk Assessment   Upper Extremity Assessment Upper Extremity Assessment: Overall WFL for tasks assessed    Lower Extremity Assessment Lower Extremity Assessment: Generalized weakness    Cervical / Trunk Assessment Cervical / Trunk Assessment: Normal  Communication  Communication: No difficulties  Cognition Arousal/Alertness: Awake/alert Behavior During Therapy: WFL for tasks assessed/performed Overall Cognitive Status: Within Functional Limits for tasks assessed                                         General Comments      Exercises     Assessment/Plan    PT Assessment All further PT needs can be met in the next venue of care  PT Problem List Decreased activity tolerance;Decreased mobility;Decreased balance       PT Treatment Interventions      PT Goals (Current goals can be found in the Care Plan section)  Acute Rehab PT Goals Patient Stated Goal: return home PT Goal Formulation: With patient Time For Goal Achievement: 01/31/19 Potential to Achieve Goals: Good    Frequency     Barriers to discharge        Co-evaluation               AM-PAC PT "6 Clicks" Mobility  Outcome Measure Help needed turning from your back to your side while in a flat bed without using bedrails?: None Help needed moving from lying on your back to sitting on the side of a flat bed without using bedrails?: None Help needed moving to and from a bed to a chair (including a wheelchair)?: None Help needed standing up from a chair using your arms (e.g., wheelchair or bedside chair)?: None Help needed to walk in hospital room?: None Help needed climbing 3-5 steps with a railing? : A Little 6 Click Score: 23    End of Session   Activity Tolerance: Patient tolerated treatment well;Patient limited by fatigue Patient left: in bed;with call bell/phone within reach(seated at bedside) Nurse Communication: Mobility status PT Visit Diagnosis: Unsteadiness on feet (R26.81);Other abnormalities of gait and mobility (R26.89);Muscle weakness (generalized) (M62.81)    Time: 0012-3935 PT Time Calculation (min) (ACUTE ONLY): 23 min   Charges:   PT Evaluation $PT Eval Moderate Complexity: 1 Mod PT Treatments $Therapeutic Activity: 23-37 mins        12:22 PM, 01/31/19 Lonell Grandchild, MPT Physical Therapist with [facility] [phone] office [phone] mobile phone

## 2019-01-31 NOTE — Discharge Summary (Signed)
Physician Discharge Summary  Charlotte Henry BOF:[id number] DOB: April 24, 1941 DOA: 01/30/2019  PCP: Alycia Rossetti, MD  Admit date: 01/30/2019  Discharge date: 01/31/2019  Admitted From:Home  Disposition:  Home  Recommendations for Outpatient Follow-up:  1. Follow up with PCP in 1-2 weeks 2. Continue on meclizine as needed for symptoms of dizziness.  She had significant improvement with use of meclizine while inpatient and has no further dizziness. 3. Please consider repeat head CT if symptoms continue to persist without any further explanation.  Patient cannot have MRI on account of pacemaker. 4. Continue on other home medications as prior 5. Please follow-up 2D echocardiogram in outpatient setting.  This was performed due to cardiac murmur.  Home Health: Yes with physical therapy  Equipment/Devices: None  Discharge Condition: Stable  CODE STATUS: Full  Diet recommendation: Heart Healthy  Brief/Interim Summary: Per HPI: Charlotte Henry  is a 78 y.o. 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 .  Pt denies headache, syncope, focal numbness, tingling, weakness, seizure activity.  Pt denies hearing loss.  Patient was admitted for symptoms of vertigo and underwent head CT with no acute findings noted.  She was not noted to be orthostatic and was able to ambulate with physical therapy with no issues or concerns or further dizziness noted.  It appears that the meclizine had really helped her and she may have some benign paroxysmal positional vertigo.  She is to continue the rest of her medications as prescribed.  Please consider repeat head CT in the near future if symptoms continue to persist or worsen.  Patient cannot have MRI on  account of her pacemaker.  She is otherwise stable for discharge with home health physical therapy.  Discharge Diagnoses:  Principal Problem:   Vertigo Active Problems:   Hyperlipidemia   Essential hypertension   Peripheral vascular disease (HCC)   CHB (complete heart block) (HCC)   COPD (chronic obstructive pulmonary disease) (HCC)   Malignant neoplasm of upper lobe of right lung [facility])  Principal discharge diagnosis: Vertigo likely secondary to BPPV.  Discharge Instructions  Discharge Instructions    Diet - low sodium heart healthy   Complete by: As directed    Increase activity slowly   Complete by: As directed      Allergies as of 01/31/2019      Reactions   Ace Inhibitors Hives, Shortness Of Breath, Swelling   Aspirin Other (See Comments)   Blood in stool and nose bleed   Codeine Swelling   Pineapple Shortness Of Breath, Swelling   Plasticized Base [plastibase] Hives, Swelling   No plastics   Ultram [tramadol] Swelling   Adhesive [tape] Swelling, Rash   Naprosyn [naproxen] Swelling   Biaxin [clarithromycin] Nausea And Vomiting, Other (See Comments)   Dizziness.   Clindamycin/lincomycin Other (See Comments)   Burning sensation throat, abdomen   Linzess [linaclotide]    DIARRHEA, FECAL INCONTINENCE   Metronidazole    NAUSEA AND WEAKNESS   Nsaids    Penicillins    Lip swelling, Hives Has patient had a PCN reaction causing immediate rash, facial/tongue/throat swelling, SOB or lightheadedness with hypotension:YES Has patient had a PCN reaction causing severe rash involving mucus membranes or skin necrosis: NO Has patient had a PCN  reaction that required hospitalization NO Has patient had a PCN reaction occurring within the last 10 years: NO If all of the above answers are "NO", then may proceed with Cephalosporin use.   Varenicline Tartrate Swelling   Xarelto [rivaroxaban]    Bleeding      Medication List    TAKE these medications   albuterol 108 (90 Base)  MCG/ACT inhaler Commonly known as: VENTOLIN HFA Inhale 2 puffs into the lungs every 4 (four) hours as needed for wheezing or shortness of breath.   ALPRAZolam 0.25 MG tablet Commonly known as: XANAX TAKE 1 TABLET BY MOUTH AT BEDTIME AS NEEDED. What changed: reasons to take this   atorvastatin 10 MG tablet Commonly known as: LIPITOR Take 1 tablet (10 mg total) by mouth 3 (three) times a week.   calcium citrate-vitamin D 315-200 MG-UNIT tablet Commonly known as: CITRACAL+D Take 1 tablet by mouth daily.   esomeprazole 40 MG capsule Commonly known as: NEXIUM TAKE 1 CAPSULE BY MOUTH DAILY BEFORE BREAKFAST. What changed: See the new instructions.   ezetimibe 10 MG tablet Commonly known as: ZETIA Take 1 tablet (10 mg total) by mouth daily.   ferrous sulfate 325 (65 FE) MG tablet Take 325 mg by mouth daily with breakfast.   gabapentin 100 MG capsule Commonly known as: NEURONTIN TAKE 1 TO 2 CAPSULES BY MOUTH AT BEDTIME FOR NERVE PAIN. What changed:   how much to take  how to take this  when to take this  additional instructions   hydrOXYzine 25 MG tablet Commonly known as: ATARAX/VISTARIL TAKE (1) TABLET BY MOUTH EVERY (8) HOURS AS NEEDED FOR ITCHING.   lidocaine 5 % Commonly known as: Lidoderm Place 1 patch onto the skin daily. Remove & Discard patch within 12 hours or as directed by MD   meclizine 25 MG tablet Commonly known as: ANTIVERT Take 1 tablet (25 mg total) by mouth every 6 (six) hours as needed for dizziness.   multivitamin with minerals Tabs tablet Take 1 tablet by mouth daily.   ondansetron 4 MG disintegrating tablet Commonly known as: Zofran ODT Take 1 tablet (4 mg total) by mouth every 8 (eight) hours as needed for nausea or vomiting.   VITAMIN B 12 PO Take 1 tablet by mouth daily.      Follow-up Information    [facility], Modena Nunnery, MD Follow up in 1 week(s).   Specialty: Family Medicine Contact information: [address] [place]  [address] [phone]        [name], MD .   Specialty: Cardiology Contact information: [place] [address] [phone]          Allergies  Allergen Reactions  . Ace Inhibitors Hives, Shortness Of Breath and Swelling  . Aspirin Other (See Comments)    Blood in stool and nose bleed  . Codeine Swelling  . Pineapple Shortness Of Breath and Swelling  . Plasticized Base [Plastibase] Hives and Swelling    No plastics  . Ultram [Tramadol] Swelling  . Adhesive [Tape] Swelling and Rash  . Naprosyn [Naproxen] Swelling  . Biaxin [Clarithromycin] Nausea And Vomiting and Other (See Comments)    Dizziness.  . Clindamycin/Lincomycin Other (See Comments)    Burning sensation throat, abdomen  . Linzess [Linaclotide]     DIARRHEA, FECAL INCONTINENCE  . Metronidazole     NAUSEA AND WEAKNESS  . Nsaids   . Penicillins     Lip swelling, Hives Has patient had a  PCN reaction causing immediate rash, facial/tongue/throat swelling, SOB or lightheadedness with hypotension:YES Has patient had a PCN reaction causing severe rash involving mucus membranes or skin necrosis: NO Has patient had a PCN reaction that required hospitalization NO Has patient had a PCN reaction occurring within the last 10 years: NO If all of the above answers are "NO", then may proceed with Cephalosporin use.  . Varenicline Tartrate Swelling  . Xarelto [Rivaroxaban]     Bleeding    Consultations:  None   Procedures/Studies: Dg Chest 2 View  Result Date: 01/30/2019 CLINICAL DATA:  Chest pain, syncope EXAM: CHEST - 2 VIEW COMPARISON:  08/15/2016 FINDINGS: The heart size and mediastinal contours are within normal limits. Left chest multi lead pacer. Both lungs are clear. Disc degenerative disease of the thoracic spine. IMPRESSION: No acute abnormality of the lungs. Electronically Signed   By: Eddie Candle M.D.   On: 01/30/2019 16:21   Ct Head Wo Contrast  Result Date: 01/30/2019 CLINICAL DATA:   Chest pain, dizziness EXAM: CT HEAD WITHOUT CONTRAST TECHNIQUE: Contiguous axial images were obtained from the base of the skull through the vertex without intravenous contrast. COMPARISON:  12/09/2009 FINDINGS: Brain: There is atrophy and chronic small vessel disease changes. No acute intracranial abnormality. Specifically, no hemorrhage, hydrocephalus, mass lesion, acute infarction, or significant intracranial injury. Vascular: No hyperdense vessel or unexpected calcification. Skull: No acute calvarial abnormality. Sinuses/Orbits: Visualized paranasal sinuses and mastoids clear. Orbital soft tissues unremarkable. Other: None IMPRESSION: Atrophy, chronic microvascular disease. No acute intracranial abnormality. Electronically Signed   By: Rolm Baptise M.D.   On: 01/30/2019 21:41      Discharge Exam: Vitals:   01/30/19 2350 01/31/19 0438  BP: (!) 152/73 137/75  Pulse: (!) 58 (!) 59  Resp: 17 17  Temp:  98.2 F (36.8 C)  SpO2: 100% 100%   Vitals:   01/30/19 2350 01/31/19 0035 01/31/19 0438 01/31/19 0500  BP: (!) 152/73  137/75   Pulse: (!) 58  (!) 59   Resp: 17  17   Temp:   98.2 F (36.8 C)   TempSrc:      SpO2: 100%  100%   Weight:  66.7 kg  66.7 kg  Height:  5\' 7"  (1.702 m)      General: Pt is alert, awake, not in acute distress Cardiovascular: RRR, S1/S2 +, no rubs, no gallops Respiratory: CTA bilaterally, no wheezing, no rhonchi Abdominal: Soft, NT, ND, bowel sounds + Extremities: no edema, no cyanosis    The results of significant diagnostics from this hospitalization (including imaging, microbiology, ancillary and laboratory) are listed below for reference.     Microbiology: No results found for this or any previous visit (from the past 240 hour(s)).   Labs: BNP (last 3 results) No results for input(s): BNP in the last 8760 hours. Basic Metabolic Panel: Recent Labs  Lab 01/27/19 1823 01/30/19 1632 01/31/19 0859  NA 137 139 140  K 4.3 3.8 4.0  CL 106 108 107   CO2 24 24 25   GLUCOSE 124* 115* 116*  BUN 24* 17 16  CREATININE 1.00 1.08* 0.89  CALCIUM 9.4 9.3 9.7   Liver Function Tests: Recent Labs  Lab 01/27/19 1823 01/30/19 1829 01/31/19 0859  AST 22 19 21   ALT 13 12 12   ALKPHOS 44 43 53  BILITOT 0.5 0.6 0.5  PROT 7.2 7.0 7.1  ALBUMIN 3.9 3.8 3.9   Recent Labs  Lab 01/27/19 1823  LIPASE 18   No results for  input(s): AMMONIA in the last 168 hours. CBC: Recent Labs  Lab 01/27/19 1823 01/30/19 1632 01/31/19 0859  WBC 6.7 5.9 7.5  HGB 10.6* 9.9* 10.9*  HCT 33.6* 31.2* 33.9*  MCV 93.6 94.0 92.4  PLT 229 236 236   Cardiac Enzymes: No results for input(s): CKTOTAL, CKMB, CKMBINDEX, TROPONINI in the last 168 hours. BNP: Invalid input(s): POCBNP CBG: No results for input(s): GLUCAP in the last 168 hours. D-Dimer No results for input(s): DDIMER in the last 72 hours. Hgb A1c No results for input(s): HGBA1C in the last 72 hours. Lipid Profile No results for input(s): CHOL, HDL, LDLCALC, TRIG, CHOLHDL, LDLDIRECT in the last 72 hours. Thyroid function studies Recent Labs    01/30/19 1826  TSH 1.518   Anemia work up No results for input(s): VITAMINB12, FOLATE, FERRITIN, TIBC, IRON, RETICCTPCT in the last 72 hours. Urinalysis    Component Value Date/Time   COLORURINE YELLOW 01/30/2019 2009   APPEARANCEUR CLEAR 01/30/2019 2009   LABSPEC 1.012 01/30/2019 2009   PHURINE 5.0 01/30/2019 2009   GLUCOSEU NEGATIVE 01/30/2019 2009   HGBUR NEGATIVE 01/30/2019 2009   BILIRUBINUR NEGATIVE 01/30/2019 2009   [place] NEGATIVE 01/30/2019 2009   PROTEINUR NEGATIVE 01/30/2019 2009   UROBILINOGEN 2 (H) 12/21/2014 1455   NITRITE NEGATIVE 01/30/2019 2009   LEUKOCYTESUR NEGATIVE 01/30/2019 2009   Sepsis Labs Invalid input(s): PROCALCITONIN,  WBC,  LACTICIDVEN Microbiology No results found for this or any previous visit (from the past 240 hour(s)).   Time coordinating discharge: 35 minutes  SIGNED:   Rodena Goldmann, DO [facility] 01/31/2019, 11:47 AM  If 7PM-7AM, please contact night-coverage [email or web address] Password [email or web address]

## 2019-01-31 NOTE — Progress Notes (Signed)
*  PRELIMINARY RESULTS* Echocardiogram 2D Echocardiogram has been performed.  Leavy Cella 01/31/2019, 11:47 AM

## 2019-01-31 NOTE — TOC Transition Note (Signed)
Transition of Care [facility]) - CM/SW Discharge Note   Patient Details  Name: Charlotte Henry MRN: [id number] Date of Birth: [birth year]  [facility] CM/SW Contact:  Shade Flood, LCSW Phone Number: 01/31/2019, 2:45 PM   Clinical Narrative:     Pt was observation and discharged within 24 hours. Pt left the hospital before [facility] could speak with her about PT [facility] recommendation. Contacted pt and dtr by phone to discuss. Pt interested in the [facility] PT. Reviewed CMS ratings of [place] options and referred to [facility] at pt request.   Pt aware [facility] will contact her at home to schedule the first visit. Pt did not identify any other TOC needs at this time.  Final next level of care: [place] Barriers to Discharge: Barriers Resolved   Patient Goals and CMS Choice Patient states their goals for this hospitalization and ongoing recovery are:: recover to baseline CMS Medicare.gov Compare Post Acute Care list provided to:: Patient Choice offered to / list presented to : Patient  Discharge Placement                       Discharge Plan and Services                          HH Arranged: PT [facility] Agency: [place] ([facility]) Date Delta: 01/31/19 Time Gas: [place] Representative spoke with at [place]: Leda Quail  Social Determinants of Health ([place]) Interventions     Readmission Risk Interventions No flowsheet data found.

## 2019-02-01 DIAGNOSIS — I129 Hypertensive chronic kidney disease with stage 1 through stage 4 chronic kidney disease, or unspecified chronic kidney disease: Secondary | ICD-10-CM | POA: Diagnosis not present

## 2019-02-01 DIAGNOSIS — I2510 Atherosclerotic heart disease of native coronary artery without angina pectoris: Secondary | ICD-10-CM | POA: Diagnosis not present

## 2019-02-01 DIAGNOSIS — C3411 Malignant neoplasm of upper lobe, right bronchus or lung: Secondary | ICD-10-CM | POA: Diagnosis not present

## 2019-02-01 DIAGNOSIS — I739 Peripheral vascular disease, unspecified: Secondary | ICD-10-CM | POA: Diagnosis not present

## 2019-02-01 DIAGNOSIS — I773 Arterial fibromuscular dysplasia: Secondary | ICD-10-CM | POA: Diagnosis not present

## 2019-02-01 DIAGNOSIS — I6529 Occlusion and stenosis of unspecified carotid artery: Secondary | ICD-10-CM | POA: Diagnosis not present

## 2019-02-01 DIAGNOSIS — N183 Chronic kidney disease, stage 3 (moderate): Secondary | ICD-10-CM | POA: Diagnosis not present

## 2019-02-01 DIAGNOSIS — I442 Atrioventricular block, complete: Secondary | ICD-10-CM | POA: Diagnosis not present

## 2019-02-01 DIAGNOSIS — R42 Dizziness and giddiness: Secondary | ICD-10-CM | POA: Diagnosis not present

## 2019-02-03 DIAGNOSIS — I739 Peripheral vascular disease, unspecified: Secondary | ICD-10-CM | POA: Diagnosis not present

## 2019-02-03 DIAGNOSIS — C3411 Malignant neoplasm of upper lobe, right bronchus or lung: Secondary | ICD-10-CM | POA: Diagnosis not present

## 2019-02-03 DIAGNOSIS — R42 Dizziness and giddiness: Secondary | ICD-10-CM | POA: Diagnosis not present

## 2019-02-03 DIAGNOSIS — I2510 Atherosclerotic heart disease of native coronary artery without angina pectoris: Secondary | ICD-10-CM | POA: Diagnosis not present

## 2019-02-03 DIAGNOSIS — I442 Atrioventricular block, complete: Secondary | ICD-10-CM | POA: Diagnosis not present

## 2019-02-03 DIAGNOSIS — I6529 Occlusion and stenosis of unspecified carotid artery: Secondary | ICD-10-CM | POA: Diagnosis not present

## 2019-02-03 DIAGNOSIS — N183 Chronic kidney disease, stage 3 (moderate): Secondary | ICD-10-CM | POA: Diagnosis not present

## 2019-02-03 DIAGNOSIS — I773 Arterial fibromuscular dysplasia: Secondary | ICD-10-CM | POA: Diagnosis not present

## 2019-02-03 DIAGNOSIS — I129 Hypertensive chronic kidney disease with stage 1 through stage 4 chronic kidney disease, or unspecified chronic kidney disease: Secondary | ICD-10-CM | POA: Diagnosis not present

## 2019-02-06 DIAGNOSIS — I739 Peripheral vascular disease, unspecified: Secondary | ICD-10-CM | POA: Diagnosis not present

## 2019-02-06 DIAGNOSIS — I442 Atrioventricular block, complete: Secondary | ICD-10-CM | POA: Diagnosis not present

## 2019-02-06 DIAGNOSIS — I6529 Occlusion and stenosis of unspecified carotid artery: Secondary | ICD-10-CM | POA: Diagnosis not present

## 2019-02-06 DIAGNOSIS — I129 Hypertensive chronic kidney disease with stage 1 through stage 4 chronic kidney disease, or unspecified chronic kidney disease: Secondary | ICD-10-CM | POA: Diagnosis not present

## 2019-02-06 DIAGNOSIS — I773 Arterial fibromuscular dysplasia: Secondary | ICD-10-CM | POA: Diagnosis not present

## 2019-02-06 DIAGNOSIS — N183 Chronic kidney disease, stage 3 (moderate): Secondary | ICD-10-CM | POA: Diagnosis not present

## 2019-02-06 DIAGNOSIS — R42 Dizziness and giddiness: Secondary | ICD-10-CM | POA: Diagnosis not present

## 2019-02-06 DIAGNOSIS — C3411 Malignant neoplasm of upper lobe, right bronchus or lung: Secondary | ICD-10-CM | POA: Diagnosis not present

## 2019-02-06 DIAGNOSIS — I2510 Atherosclerotic heart disease of native coronary artery without angina pectoris: Secondary | ICD-10-CM | POA: Diagnosis not present

## 2019-02-11 ENCOUNTER — Encounter: Payer: Self-pay | Admitting: Family Medicine

## 2019-02-11 ENCOUNTER — Ambulatory Visit (INDEPENDENT_AMBULATORY_CARE_PROVIDER_SITE_OTHER): Payer: Medicare HMO | Admitting: Family Medicine

## 2019-02-11 ENCOUNTER — Other Ambulatory Visit: Payer: Self-pay

## 2019-02-11 VITALS — BP 120/66 | HR 70 | Temp 98.2°F | Resp 14 | Ht 67.0 in | Wt 142.8 lb

## 2019-02-11 DIAGNOSIS — E46 Unspecified protein-calorie malnutrition: Secondary | ICD-10-CM

## 2019-02-11 DIAGNOSIS — R531 Weakness: Secondary | ICD-10-CM | POA: Diagnosis not present

## 2019-02-11 DIAGNOSIS — N183 Chronic kidney disease, stage 3 unspecified: Secondary | ICD-10-CM

## 2019-02-11 DIAGNOSIS — I10 Essential (primary) hypertension: Secondary | ICD-10-CM | POA: Diagnosis not present

## 2019-02-11 DIAGNOSIS — Z23 Encounter for immunization: Secondary | ICD-10-CM

## 2019-02-11 NOTE — Progress Notes (Signed)
Subjective:    Patient ID: Charlotte Henry, female    DOB: 09-04-1940, 78 y.o.   MRN: [id number]  Patient presents for Hospital F/U  Patient here for hospital follow-up.  She was seen about 2 weeks ago with dehydration loss of appetite initially was concerned she may have some cellulitis from a staying.  She has underlying lung cancer which I had set up a CT scan of chest and abdomen to be done with all of her symptoms.  She called back a few days later stating that she was severely dizzy with still not eating or drinking recommended that she go to the hospital where she was admitted.  She had CT scan which was unremarkable she cannot have an MRI secondary to her pacemaker.  She was continued on meclizine which did help improve her dizzy spells.  She had a 2D echo done which did show mild diastolic dysfunction but preserved ejection fracture and no significant abnormality with regards to heart monitor that was heard.  Scheduled to have her CT scan of her chest and abdomen this week these were not done when she was admitted to the hospital.  Her labs are fairly unremarkable her chronic anemia was stable.  COVID test was negative  Physical therapy helped her starting tomorrow.  From hospital discharge summary : recommendations for Outpatient Follow-up:  1. Follow up with PCP in 1-2 weeks 2. Continue on meclizine as needed for symptoms of dizziness.  She had significant improvement with use of meclizine while inpatient and has no further dizziness. 3. Please consider repeat head CT if symptoms continue to persist without any further explanation.  Patient cannot have MRI on account of pacemaker. 4. Continue on other home medications as prior 5. Please follow-up 2D echocardiogram in outpatient setting.  This was performed due to cardiac murmur.  Today she is actually quite upset with her daughter.  They has been fighting for the past few days.  She feels like her daughter is trying to control her  and everything that she does and that she is "stupid".  She actually started cursing at her daughter therefore I interviewed him into different rooms.  Her daughter states that they have been trying to get an aide within the home they found someone that can stay for a few hours and then she refused to let the person come.  This morning when she got over to the house she cannot get to the bathroom fast enough and the patient had a bowel movement throughout the house which she would have had assistance with if she would have let the aide come.  She only wants her granddaughter to come who is unavailable as she is working.  She has not been using her cane or walker they are very concerned about her gait stability.  She only eats if they continue to nag her.  Per my previous visit she is scheduled for her scan this Thursday which she is willing to go to.    Review Of Systems:  GEN- denies fatigue, fever, weight loss,+weakness, recent illness HEENT- denies eye drainage, change in vision, nasal discharge, CVS- denies chest pain, palpitations RESP- denies SOB, cough, wheeze ABD- denies N/V, change in stools, abd pain GU- denies dysuria, hematuria, dribbling, incontinence MSK- denies joint pain, muscle aches, injury Neuro- denies headache, dizziness, syncope, seizure activity       Objective:    BP 120/66   Pulse 70   Temp 98.2 F (36.8 C) (Oral)  Resp 14   Ht 5\' 7"  (1.702 m)   Wt 142 lb 12.8 oz (64.8 kg)   SpO2 95%   BMI 22.37 kg/m  GEN- NAD, alert and oriented x3, , elderly female weak appearing unsteady gait- no cane or walker today  HEENT- PERRL, EOMI, non injected sclera, pink conjunctiva, MMM oropharynx clear Neck- Supple, no thyromegaly CVS- 2/6 SEM no murmur RESP-CTAB ABD-NABS,soft,NT,ND EXT- No edema,  Pulses- Radial, DP-diminished        Assessment & Plan:    Approximately 30 minutes spent with patient and her daughter greater than 50% on counseling of her chronic  medical problems Problem List Items Addressed This Visit      Unprioritized   Chronic kidney disease (CKD), stage III (moderate) (HCC)   Essential hypertension - Primary    Pressure looks okay.  I am rechecking her renal function as she is due to have scans in a couple of days.  She is eating and drinking better per her report.  Her weight is up 2 pounds since her last visit      Relevant Orders   Basic metabolic panel   Generalized weakness   Protein-calorie malnutrition ([place])    Continue concern for protein malnutrition generalized weakness.  She is at with with her children because she feels like they are taking over her independence which this is very common at her age.  Discussed with her that I do not think that she can lives alone that she needs some type of help at home.  She is high risk for falls and which can result in a fracture.  Advised that she would need to go to skilled nursing facility if she continued to decline.  CT scans to be done tomorrow Thursday evening.  Physical therapy and home health nurse will be coming out to the home. Today she agrees to let the aide they have already arranged come to her house for a few hours a day.  She refuses moving in with either 1 of her children.      Relevant Orders   Basic metabolic panel    Other Visit Diagnoses    Need for immunization against influenza       Relevant Orders   Flu Vaccine QUAD High Dose(Fluad) (Completed)      Note: This dictation was prepared with Dragon dictation along with smaller phrase technology. Any transcriptional errors that result from this process are unintentional.

## 2019-02-11 NOTE — Assessment & Plan Note (Signed)
Pressure looks okay.  I am rechecking her renal function as she is due to have scans in a couple of days.  She is eating and drinking better per her report.  Her weight is up 2 pounds since her last visit

## 2019-02-11 NOTE — Assessment & Plan Note (Signed)
Continue concern for protein malnutrition generalized weakness.  She is at with with her children because she feels like they are taking over her independence which this is very common at her age.  Discussed with her that I do not think that she can lives alone that she needs some type of help at home.  She is high risk for falls and which can result in a fracture.  Advised that she would need to go to skilled nursing facility if she continued to decline.  CT scans to be done tomorrow Thursday evening.  Physical therapy and home health nurse will be coming out to the home. Today she agrees to let the aide they have already arranged come to her house for a few hours a day.  She refuses moving in with either 1 of her children.

## 2019-02-11 NOTE — Patient Instructions (Addendum)
Keep appointment for scans on Thursday  FLu shot given  We will call with lab results  Recommend you keep aide  Keep taking boose  F/U pending results

## 2019-02-12 DIAGNOSIS — I6529 Occlusion and stenosis of unspecified carotid artery: Secondary | ICD-10-CM | POA: Diagnosis not present

## 2019-02-12 DIAGNOSIS — R42 Dizziness and giddiness: Secondary | ICD-10-CM | POA: Diagnosis not present

## 2019-02-12 DIAGNOSIS — I2510 Atherosclerotic heart disease of native coronary artery without angina pectoris: Secondary | ICD-10-CM | POA: Diagnosis not present

## 2019-02-12 DIAGNOSIS — I739 Peripheral vascular disease, unspecified: Secondary | ICD-10-CM | POA: Diagnosis not present

## 2019-02-12 DIAGNOSIS — I773 Arterial fibromuscular dysplasia: Secondary | ICD-10-CM | POA: Diagnosis not present

## 2019-02-12 DIAGNOSIS — N183 Chronic kidney disease, stage 3 (moderate): Secondary | ICD-10-CM | POA: Diagnosis not present

## 2019-02-12 DIAGNOSIS — C3411 Malignant neoplasm of upper lobe, right bronchus or lung: Secondary | ICD-10-CM | POA: Diagnosis not present

## 2019-02-12 DIAGNOSIS — I129 Hypertensive chronic kidney disease with stage 1 through stage 4 chronic kidney disease, or unspecified chronic kidney disease: Secondary | ICD-10-CM | POA: Diagnosis not present

## 2019-02-12 DIAGNOSIS — I442 Atrioventricular block, complete: Secondary | ICD-10-CM | POA: Diagnosis not present

## 2019-02-12 LAB — BASIC METABOLIC PANEL
BUN/Creatinine Ratio: 23 (calc) — ABNORMAL HIGH (ref 6–22)
BUN: 28 mg/dL — ABNORMAL HIGH (ref 7–25)
CO2: 26 mmol/L (ref 20–32)
Calcium: 9.8 mg/dL (ref 8.6–10.4)
Chloride: 105 mmol/L (ref 98–110)
Creat: 1.2 mg/dL — ABNORMAL HIGH (ref 0.60–0.93)
Glucose, Bld: 170 mg/dL — ABNORMAL HIGH (ref 65–99)
Potassium: 4.8 mmol/L (ref 3.5–5.3)
Sodium: 141 mmol/L (ref 135–146)

## 2019-02-13 ENCOUNTER — Other Ambulatory Visit: Payer: Self-pay

## 2019-02-13 ENCOUNTER — Telehealth: Payer: Self-pay | Admitting: Family Medicine

## 2019-02-13 ENCOUNTER — Ambulatory Visit (HOSPITAL_COMMUNITY)
Admission: RE | Admit: 2019-02-13 | Discharge: 2019-02-13 | Disposition: A | Discharge status: Home / Self Care | Payer: Medicare HMO | Source: Ambulatory Visit | Attending: Family Medicine | Admitting: Family Medicine

## 2019-02-13 DIAGNOSIS — J9809 Other diseases of bronchus, not elsewhere classified: Secondary | ICD-10-CM | POA: Diagnosis not present

## 2019-02-13 DIAGNOSIS — E46 Unspecified protein-calorie malnutrition: Secondary | ICD-10-CM | POA: Insufficient documentation

## 2019-02-13 DIAGNOSIS — I739 Peripheral vascular disease, unspecified: Secondary | ICD-10-CM | POA: Diagnosis not present

## 2019-02-13 DIAGNOSIS — R42 Dizziness and giddiness: Secondary | ICD-10-CM | POA: Diagnosis not present

## 2019-02-13 DIAGNOSIS — R634 Abnormal weight loss: Secondary | ICD-10-CM | POA: Diagnosis not present

## 2019-02-13 DIAGNOSIS — I773 Arterial fibromuscular dysplasia: Secondary | ICD-10-CM | POA: Diagnosis not present

## 2019-02-13 DIAGNOSIS — N183 Chronic kidney disease, stage 3 (moderate): Secondary | ICD-10-CM | POA: Diagnosis not present

## 2019-02-13 DIAGNOSIS — I129 Hypertensive chronic kidney disease with stage 1 through stage 4 chronic kidney disease, or unspecified chronic kidney disease: Secondary | ICD-10-CM | POA: Diagnosis not present

## 2019-02-13 DIAGNOSIS — R110 Nausea: Secondary | ICD-10-CM | POA: Diagnosis not present

## 2019-02-13 DIAGNOSIS — I2510 Atherosclerotic heart disease of native coronary artery without angina pectoris: Secondary | ICD-10-CM | POA: Diagnosis not present

## 2019-02-13 DIAGNOSIS — C3411 Malignant neoplasm of upper lobe, right bronchus or lung: Secondary | ICD-10-CM | POA: Insufficient documentation

## 2019-02-13 DIAGNOSIS — I6529 Occlusion and stenosis of unspecified carotid artery: Secondary | ICD-10-CM | POA: Diagnosis not present

## 2019-02-13 DIAGNOSIS — J432 Centrilobular emphysema: Secondary | ICD-10-CM | POA: Diagnosis not present

## 2019-02-13 DIAGNOSIS — I442 Atrioventricular block, complete: Secondary | ICD-10-CM | POA: Diagnosis not present

## 2019-02-13 DIAGNOSIS — K7689 Other specified diseases of liver: Secondary | ICD-10-CM | POA: Diagnosis not present

## 2019-02-13 MED ORDER — IOHEXOL 300 MG/ML  SOLN
100.0000 mL | Freq: Once | INTRAMUSCULAR | Status: AC | PRN
  Administered 2019-02-13: 17:00:00 100 mL via INTRAVENOUS

## 2019-02-13 NOTE — Telephone Encounter (Signed)
Please see labs for further information.  

## 2019-02-13 NOTE — Telephone Encounter (Signed)
Mrs Schloemer calling about her lab work results  [phone]

## 2019-02-17 ENCOUNTER — Encounter: Payer: Self-pay | Admitting: Family Medicine

## 2019-02-17 ENCOUNTER — Encounter: Payer: Self-pay | Admitting: *Deleted

## 2019-02-17 ENCOUNTER — Other Ambulatory Visit: Payer: Self-pay | Admitting: *Deleted

## 2019-02-17 DIAGNOSIS — I714 Abdominal aortic aneurysm, without rupture, unspecified: Secondary | ICD-10-CM | POA: Insufficient documentation

## 2019-02-17 MED ORDER — ATORVASTATIN CALCIUM 10 MG PO TABS: 10.0000 mg | ORAL_TABLET | Freq: Every day | ORAL | 3 refills | Status: DC

## 2019-02-19 DIAGNOSIS — C3411 Malignant neoplasm of upper lobe, right bronchus or lung: Secondary | ICD-10-CM | POA: Diagnosis not present

## 2019-02-19 DIAGNOSIS — I773 Arterial fibromuscular dysplasia: Secondary | ICD-10-CM | POA: Diagnosis not present

## 2019-02-19 DIAGNOSIS — I129 Hypertensive chronic kidney disease with stage 1 through stage 4 chronic kidney disease, or unspecified chronic kidney disease: Secondary | ICD-10-CM | POA: Diagnosis not present

## 2019-02-19 DIAGNOSIS — N183 Chronic kidney disease, stage 3 (moderate): Secondary | ICD-10-CM | POA: Diagnosis not present

## 2019-02-19 DIAGNOSIS — R42 Dizziness and giddiness: Secondary | ICD-10-CM | POA: Diagnosis not present

## 2019-02-19 DIAGNOSIS — I2510 Atherosclerotic heart disease of native coronary artery without angina pectoris: Secondary | ICD-10-CM | POA: Diagnosis not present

## 2019-02-19 DIAGNOSIS — I6529 Occlusion and stenosis of unspecified carotid artery: Secondary | ICD-10-CM | POA: Diagnosis not present

## 2019-02-19 DIAGNOSIS — I442 Atrioventricular block, complete: Secondary | ICD-10-CM | POA: Diagnosis not present

## 2019-02-19 DIAGNOSIS — I739 Peripheral vascular disease, unspecified: Secondary | ICD-10-CM | POA: Diagnosis not present

## 2019-02-20 DIAGNOSIS — N183 Chronic kidney disease, stage 3 (moderate): Secondary | ICD-10-CM | POA: Diagnosis not present

## 2019-02-20 DIAGNOSIS — I739 Peripheral vascular disease, unspecified: Secondary | ICD-10-CM | POA: Diagnosis not present

## 2019-02-20 DIAGNOSIS — I2510 Atherosclerotic heart disease of native coronary artery without angina pectoris: Secondary | ICD-10-CM | POA: Diagnosis not present

## 2019-02-20 DIAGNOSIS — R42 Dizziness and giddiness: Secondary | ICD-10-CM | POA: Diagnosis not present

## 2019-02-20 DIAGNOSIS — I6529 Occlusion and stenosis of unspecified carotid artery: Secondary | ICD-10-CM | POA: Diagnosis not present

## 2019-02-20 DIAGNOSIS — I773 Arterial fibromuscular dysplasia: Secondary | ICD-10-CM | POA: Diagnosis not present

## 2019-02-20 DIAGNOSIS — I129 Hypertensive chronic kidney disease with stage 1 through stage 4 chronic kidney disease, or unspecified chronic kidney disease: Secondary | ICD-10-CM | POA: Diagnosis not present

## 2019-02-20 DIAGNOSIS — C3411 Malignant neoplasm of upper lobe, right bronchus or lung: Secondary | ICD-10-CM | POA: Diagnosis not present

## 2019-02-20 DIAGNOSIS — I442 Atrioventricular block, complete: Secondary | ICD-10-CM | POA: Diagnosis not present

## 2019-02-24 ENCOUNTER — Ambulatory Visit: Payer: Medicare HMO | Admitting: Cardiovascular Disease

## 2019-02-24 ENCOUNTER — Telehealth: Payer: Self-pay | Admitting: Cardiovascular Disease

## 2019-02-24 NOTE — Telephone Encounter (Signed)
Virtual Visit Pre-Appointment Phone Call  "(Name), I am calling you today to discuss your upcoming appointment. We are currently trying to limit exposure to the virus that causes COVID-19 by seeing patients at home rather than in the office."  1. "What is the BEST phone number to call the day of the visit?" - include this in appointment notes  2. Do you have or have access to (through a family member/friend) a smartphone with video capability that we can use for your visit?" a. If yes - list this number in appt notes as cell (if different from BEST phone #) and list the appointment type as a VIDEO visit in appointment notes b. If no - list the appointment type as a PHONE visit in appointment notes  3. Confirm consent - "In the setting of the current Covid19 crisis, you are scheduled for a (phone or video) visit with your provider on (date) at (time).  Just as we do with many in-office visits, in order for you to participate in this visit, we must obtain consent.  If you'd like, I can send this to your mychart (if signed up) or email for you to review.  Otherwise, I can obtain your verbal consent now.  All virtual visits are billed to your insurance company just like a normal visit would be.  By agreeing to a virtual visit, we'd like you to understand that the technology does not allow for your provider to perform an examination, and thus may limit your provider's ability to fully assess your condition. If your provider identifies any concerns that need to be evaluated in person, we will make arrangements to do so.  Finally, though the technology is pretty good, we cannot assure that it will always work on either your or our end, and in the setting of a video visit, we may have to convert it to a phone-only visit.  In either situation, we cannot ensure that we have a secure connection.  Are you willing to proceed?" STAFF: Did the patient verbally acknowledge consent to telehealth visit? Document  YES/NO here: yes  4. Advise patient to be prepared - "Two hours prior to your appointment, go ahead and check your blood pressure, pulse, oxygen saturation, and your weight (if you have the equipment to check those) and write them all down. When your visit starts, your provider will ask you for this information. If you have an Apple Watch or Kardia device, please plan to have heart rate information ready on the day of your appointment. Please have a pen and paper handy nearby the day of the visit as well."  5. Give patient instructions for MyChart download to smartphone OR [email or web address] as below if video visit (depending on what platform provider is using)  6. Inform patient they will receive a phone call 15 minutes prior to their appointment time (may be from unknown caller ID) so they should be prepared to answer    Charlotte Henry has been deemed a candidate for a follow-up tele-health visit to limit community exposure during the Covid-19 pandemic. I spoke with the patient via phone to ensure availability of phone/video source, confirm preferred email & phone number, and discuss instructions and expectations.  I reminded Charlotte Henry to be prepared with any vital sign and/or heart rhythm information that could potentially be obtained via home monitoring, at the time of her visit. I reminded Charlotte Henry to expect a phone call prior to  her visit.  Charlotte Henry 02/24/2019 8:59 AM

## 2019-02-25 DIAGNOSIS — I739 Peripheral vascular disease, unspecified: Secondary | ICD-10-CM | POA: Diagnosis not present

## 2019-02-25 DIAGNOSIS — I442 Atrioventricular block, complete: Secondary | ICD-10-CM | POA: Diagnosis not present

## 2019-02-25 DIAGNOSIS — N183 Chronic kidney disease, stage 3 (moderate): Secondary | ICD-10-CM | POA: Diagnosis not present

## 2019-02-25 DIAGNOSIS — I6529 Occlusion and stenosis of unspecified carotid artery: Secondary | ICD-10-CM | POA: Diagnosis not present

## 2019-02-25 DIAGNOSIS — I773 Arterial fibromuscular dysplasia: Secondary | ICD-10-CM | POA: Diagnosis not present

## 2019-02-25 DIAGNOSIS — I2510 Atherosclerotic heart disease of native coronary artery without angina pectoris: Secondary | ICD-10-CM | POA: Diagnosis not present

## 2019-02-25 DIAGNOSIS — I129 Hypertensive chronic kidney disease with stage 1 through stage 4 chronic kidney disease, or unspecified chronic kidney disease: Secondary | ICD-10-CM | POA: Diagnosis not present

## 2019-02-25 DIAGNOSIS — R42 Dizziness and giddiness: Secondary | ICD-10-CM | POA: Diagnosis not present

## 2019-02-25 DIAGNOSIS — C3411 Malignant neoplasm of upper lobe, right bronchus or lung: Secondary | ICD-10-CM | POA: Diagnosis not present

## 2019-03-06 DIAGNOSIS — C3411 Malignant neoplasm of upper lobe, right bronchus or lung: Secondary | ICD-10-CM | POA: Diagnosis not present

## 2019-03-06 DIAGNOSIS — I2510 Atherosclerotic heart disease of native coronary artery without angina pectoris: Secondary | ICD-10-CM | POA: Diagnosis not present

## 2019-03-06 DIAGNOSIS — I6529 Occlusion and stenosis of unspecified carotid artery: Secondary | ICD-10-CM | POA: Diagnosis not present

## 2019-03-06 DIAGNOSIS — I129 Hypertensive chronic kidney disease with stage 1 through stage 4 chronic kidney disease, or unspecified chronic kidney disease: Secondary | ICD-10-CM | POA: Diagnosis not present

## 2019-03-06 DIAGNOSIS — I739 Peripheral vascular disease, unspecified: Secondary | ICD-10-CM | POA: Diagnosis not present

## 2019-03-06 DIAGNOSIS — N183 Chronic kidney disease, stage 3 unspecified: Secondary | ICD-10-CM | POA: Diagnosis not present

## 2019-03-06 DIAGNOSIS — R42 Dizziness and giddiness: Secondary | ICD-10-CM | POA: Diagnosis not present

## 2019-03-06 DIAGNOSIS — I442 Atrioventricular block, complete: Secondary | ICD-10-CM | POA: Diagnosis not present

## 2019-03-06 DIAGNOSIS — I773 Arterial fibromuscular dysplasia: Secondary | ICD-10-CM | POA: Diagnosis not present

## 2019-03-13 DIAGNOSIS — I129 Hypertensive chronic kidney disease with stage 1 through stage 4 chronic kidney disease, or unspecified chronic kidney disease: Secondary | ICD-10-CM | POA: Diagnosis not present

## 2019-03-13 DIAGNOSIS — R42 Dizziness and giddiness: Secondary | ICD-10-CM | POA: Diagnosis not present

## 2019-03-13 DIAGNOSIS — I6529 Occlusion and stenosis of unspecified carotid artery: Secondary | ICD-10-CM | POA: Diagnosis not present

## 2019-03-13 DIAGNOSIS — I739 Peripheral vascular disease, unspecified: Secondary | ICD-10-CM | POA: Diagnosis not present

## 2019-03-13 DIAGNOSIS — N183 Chronic kidney disease, stage 3 unspecified: Secondary | ICD-10-CM | POA: Diagnosis not present

## 2019-03-13 DIAGNOSIS — C3411 Malignant neoplasm of upper lobe, right bronchus or lung: Secondary | ICD-10-CM | POA: Diagnosis not present

## 2019-03-13 DIAGNOSIS — I442 Atrioventricular block, complete: Secondary | ICD-10-CM | POA: Diagnosis not present

## 2019-03-13 DIAGNOSIS — I2510 Atherosclerotic heart disease of native coronary artery without angina pectoris: Secondary | ICD-10-CM | POA: Diagnosis not present

## 2019-03-13 DIAGNOSIS — I773 Arterial fibromuscular dysplasia: Secondary | ICD-10-CM | POA: Diagnosis not present

## 2019-03-17 ENCOUNTER — Ambulatory Visit: Payer: Self-pay | Admitting: Family Medicine

## 2019-03-18 ENCOUNTER — Inpatient Hospital Stay (HOSPITAL_COMMUNITY): Payer: Medicare HMO | Attending: Hematology

## 2019-03-20 ENCOUNTER — Other Ambulatory Visit: Payer: Self-pay

## 2019-03-20 ENCOUNTER — Telehealth (INDEPENDENT_AMBULATORY_CARE_PROVIDER_SITE_OTHER): Payer: Medicare HMO | Admitting: Cardiovascular Disease

## 2019-03-20 ENCOUNTER — Encounter: Payer: Self-pay | Admitting: Cardiovascular Disease

## 2019-03-20 VITALS — BP 126/69 | HR 69 | Ht 67.0 in | Wt 147.0 lb

## 2019-03-20 DIAGNOSIS — I25118 Atherosclerotic heart disease of native coronary artery with other forms of angina pectoris: Secondary | ICD-10-CM

## 2019-03-20 NOTE — Progress Notes (Signed)
Virtual Visit via Telephone Note   This visit type was conducted due to national recommendations for restrictions regarding the COVID-19 Pandemic (e.g. social distancing) in an effort to limit this patient's exposure and mitigate transmission in our community.  Due to her co-morbid illnesses, this patient is at least at moderate risk for complications without adequate follow up.  This format is felt to be most appropriate for this patient at this time.  The patient did not have access to video technology/had technical difficulties with video requiring transitioning to audio format only (telephone).  All issues noted in this document were discussed and addressed.  No physical exam could be performed with this format.  Please refer to the patient's chart for her  consent to telehealth for [facility].   Date:  03/20/2019   ID:  Charlotte Henry, DOB 06/03/1941, MRN [id number]  Patient Location: Home Provider Location: Home  PCP:  Alycia Rossetti, MD  Cardiologist:  Kate Sable, MD  Electrophysiologist:  Cristopher Peru, MD   Evaluation Performed:  Follow-Up Visit  Chief Complaint:  CAD  History of Present Illness:    Charlotte Henry is a 78 y.o. female with CAD, CHB s/p PPM, PAF, tobacco abuse, and PAD.  I haven't seen her since October 2018.  She was hospitalized for vertigo in August 2020.  She denies chest pain, palpitations and shortness of breath.  She has pain in her legs.  She last saw vascular surgery in 2017 and has not followed up with them.   Past Medical History:  Diagnosis Date  . 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 ASCVD (arteriosclerotic cardiovascular disease)    a. cath in 4/04,-80%LAD; 70% RCA, -> DESx2; residual 60% distal RCA; nl EF  b. Nuc 02/2016 aborted due to bradycardia. Will need to be rescheduled   . Carotid artery occlusion   . Cerebrovascular disease   . CHB (complete heart block) ([place])    a. s/p PPM on 03/24/16 with a St. Jude (serial  number [id number]) pacemaker  . Chronic kidney disease (CKD), stage III (moderate)   . Clostridium difficile colitis 03/2013   a. 03/2013  . COPD (chronic obstructive pulmonary disease) ([place])   . Coronary disease   . Diverticulosis   . GERD (gastroesophageal reflux disease)   . Gout   . H pylori ulcer   . Hyperlipidemia    chose to stop statin, takes Zetia  . Hypertension    not currently on medication  . IBS (irritable bowel syndrome)   . PVD (peripheral vascular disease) ([place])    a. moderate to severely decreased ABI's in 8/08, sig. aortic inflow disease b. repeat ABI's in 2011 improved- mild disease on the left and moderate to severe disease on the right  . S/P placement of cardiac pacemaker    a. 03/24/16: St. Jude (serial number [id number]) pacemaker  . Symptomatic anemia 08/15/2016  . TIA (transient ischemic attack) 2012  . Tobacco abuse    a. 50 pack years continuing at 1/2 pack per day  . Tobacco use disorder 10/15/2015   Past Surgical History:  Procedure Laterality Date  . ABDOMINAL HYSTERECTOMY    . carpel tunnel release     bilateral  . CHOLECYSTECTOMY    . COLONOSCOPY  2009  . COLONOSCOPY N/A 08/04/2016   incomplete due to prep.   . COLONOSCOPY N/A 08/05/2016   Dr. Oneida Alar: moderately redundant rectosigmoid and sigmoid, normal TI, single large-mouthed diverticulum in hepatic flexure, external hemorrhoids, path negative  for microscopic colitis   . ENTEROSCOPY N/A 08/18/2016   Procedure: ENTEROSCOPY;  Surgeon: Daneil Dolin, MD;  Location[facility];  Service: Endoscopy;  Laterality: N/A;  Pediatric colonoscope  . EP IMPLANTABLE DEVICE N/A 03/24/2016   Procedure: Pacemaker Implant;  Surgeon: [name], MD;  Location: [facility];  Service: Cardiovascular;  Laterality: N/A;  . ESOPHAGOGASTRODUODENOSCOPY N/A 08/04/2016   Dr. Oneida Alar: non-critical Schatzki's ring, small hiatal hernia, +H.pylori gastritis on path.   Marland Kitchen GIVENS CAPSULE STUDY N/A 08/16/2016   Procedure:  GIVENS CAPSULE STUDY;  Surgeon: Daneil Dolin, MD;  Location: [facility];  Service: Endoscopy;  Laterality: N/A;  . TOTAL ABDOMINAL HYSTERECTOMY W/ BILATERAL SALPINGOOPHORECTOMY  1979     Current Meds  Medication Sig  . albuterol (PROVENTIL HFA;VENTOLIN HFA) 108 (90 Base) MCG/ACT inhaler Inhale 2 puffs into the lungs every 4 (four) hours as needed for wheezing or shortness of breath.  . ALPRAZolam (XANAX) 0.25 MG tablet TAKE 1 TABLET BY MOUTH AT BEDTIME AS NEEDED. (Patient taking differently: Take 0.25 mg by mouth at bedtime as needed for anxiety or sleep. )  . atorvastatin (LIPITOR) 10 MG tablet Take 1 tablet (10 mg total) by mouth daily at 6 PM.  . calcium citrate-vitamin D (CITRACAL+D) 315-200 MG-UNIT per tablet Take 1 tablet by mouth daily.    . Cyanocobalamin (VITAMIN B 12 PO) Take 1 tablet by mouth daily.   Marland Kitchen esomeprazole (NEXIUM) 40 MG capsule TAKE 1 CAPSULE BY MOUTH DAILY BEFORE BREAKFAST. (Patient taking differently: Take 40 mg by mouth daily at 12 noon. )  . ezetimibe (ZETIA) 10 MG tablet Take 1 tablet (10 mg total) by mouth daily.  . ferrous sulfate 325 (65 FE) MG tablet Take 325 mg by mouth daily with breakfast.  . gabapentin (NEURONTIN) 100 MG capsule TAKE 1 TO 2 CAPSULES BY MOUTH AT BEDTIME FOR NERVE PAIN. (Patient taking differently: Take 100 mg by mouth at bedtime. )  . hydrOXYzine (ATARAX/VISTARIL) 25 MG tablet TAKE (1) TABLET BY MOUTH EVERY (8) HOURS AS NEEDED FOR ITCHING.  . lidocaine (LIDODERM) 5 % Place 1 patch onto the skin daily. Remove & Discard patch within 12 hours or as directed by MD  . meclizine (ANTIVERT) 25 MG tablet Take 1 tablet (25 mg total) by mouth every 6 (six) hours as needed for dizziness.  . Multiple Vitamin (MULTIVITAMIN WITH MINERALS) TABS tablet Take 1 tablet by mouth daily.  . ondansetron (ZOFRAN ODT) 4 MG disintegrating tablet Take 1 tablet (4 mg total) by mouth every 8 (eight) hours as needed for nausea or vomiting.     Allergies:   Ace  inhibitors, Aspirin, Codeine, Pineapple, Plasticized base [plastibase], Ultram [tramadol], Adhesive [tape], Naprosyn [naproxen], Biaxin [clarithromycin], Clindamycin/lincomycin, Linzess [linaclotide], Metronidazole, Nsaids, Penicillins, Varenicline tartrate, and Xarelto [rivaroxaban]   Social History   Tobacco Use  . Smoking status: Current Every Day Smoker    Packs/day: 1.00    Years: 50.00    Pack years: 50.00    Types: Cigarettes    Start date: 02/02/1966  . Smokeless tobacco: Never Used  . Tobacco comment: 1/2 pack daily  Substance Use Topics  . Alcohol use: No    Alcohol/week: 0.0 standard drinks    Comment: H/O of 6 pack per day quiting in 2003  . Drug use: No     Family Hx: The patient's family history includes Alcohol abuse in her brother; Aneurysm (age of onset: 22) in her father; Cancer in her sister; Cerebral  aneurysm in her father; Diabetes type II in her brother; Hypertension in her mother; Liver disease in her brother, brother, and brother; Liver disease (age of onset: 36) in her mother. There is no history of Colon cancer.  ROS:   Please see the history of present illness.     All other systems reviewed and are negative.   Prior CV studies:   The following studies were reviewed today:  Echo 01/31/19:   1. Stage 1: stage1: Apex is abnormal.  2. The left ventricle has normal systolic function, with an ejection fraction of 55-60%. The cavity size was normal. Moderate basal septal hypertrophy. Otherwise, mild concentric LVH of remaining myocardium. Left ventricular diastolic Doppler parameters  are consistent with impaired relaxation.  3. The right ventricle has normal systolic function. The cavity was normal. There is no increase in right ventricular wall thickness.  4. Left atrial size was mildly dilated.  5. The mitral valve is grossly normal.  6. The tricuspid valve is grossly normal.  7. The aortic valve is tricuspid. Mild thickening of the aortic valve. Mild  calcification of the aortic valve.  8. The aorta is normal unless otherwise noted.  Labs/Other Tests and Data Reviewed:    EKG:  No ECG reviewed.  Recent Labs: 01/30/2019: TSH 1.518 01/31/2019: ALT 12; Hemoglobin 10.9; Platelets 236 02/11/2019: BUN 28; Creat 1.20; Potassium 4.8; Sodium 141   Recent Lipid Panel Lab Results  Component Value Date/Time   CHOL 168 06/28/2018 01:02 PM   TRIG 112 06/28/2018 01:02 PM   TRIG 112 08/11/2008   HDL 47 (L) 06/28/2018 01:02 PM   CHOLHDL 3.6 06/28/2018 01:02 PM   LDLCALC 100 (H) 06/28/2018 01:02 PM    Wt Readings from Last 3 Encounters:  03/20/19 147 lb (66.7 kg)  02/11/19 142 lb 12.8 oz (64.8 kg)  01/31/19 147 lb 0.8 oz (66.7 kg)     Objective:    Vital Signs:  BP 126/69   Pulse 69   Ht 5\' 7"  (1.702 m)   Wt 147 lb (66.7 kg)   BMI 23.02 kg/m    VITAL SIGNS:  reviewed  ASSESSMENT & PLAN:    1. CAD: She underwent drug-eluting stent placement to the LAD and RCA in 2004. Symptomatically stable. Not on ASA (allergic). Maintained on atorvastatin and Zetia.   2. Essential HTN: BP normal. No changes.  3. Hyperlipidemia: Lipids reviewed. LDL 100. Maintained on atorvastatin and Zetia. I will increase atorvastatin to 20 mg.  4. CHB s/p PPM: Medtronic dual-chamber pacemaker. Stable with normal device function. Routine monitoring in device clinic.  5. Peripheral vascular disease: Previously followed by vascular surgery. Takes a statin. Tobacco cessation counseling previously given. Continues to smoke. Has claudication pain. I will make a referral back to vascular surgery. She will likely need follow up imaging.  6. Tobacco abuse disorder: Tobacco cessation counseling previously given. Continues to smoke.  7. PAF: Not an anticoagulation candidate.  8. Carotid artery stenosis: 50-69% left ICA stenosis on 05/27/18 by Dopplers. I will repeat in December 2020.   COVID-19 Education: The signs and symptoms of COVID-19 were discussed with  the patient and how to seek care for testing (follow up with PCP or arrange E-visit).  The importance of social distancing was discussed today.  Time:   Today, I have spent 25 minutes with the patient with telehealth technology discussing the above problems.     Medication Adjustments/Labs and Tests Ordered: Current medicines are reviewed at length with the patient today.  Concerns regarding medicines are outlined above.   Tests Ordered: No orders of the defined types were placed in this encounter.   Medication Changes: No orders of the defined types were placed in this encounter.   Follow Up:  Either In Person or Virtual in 1 year(s)  Signed, Kate Sable, MD  03/20/2019 10:54 AM    [place]

## 2019-03-21 DIAGNOSIS — I739 Peripheral vascular disease, unspecified: Secondary | ICD-10-CM | POA: Diagnosis not present

## 2019-03-21 DIAGNOSIS — I129 Hypertensive chronic kidney disease with stage 1 through stage 4 chronic kidney disease, or unspecified chronic kidney disease: Secondary | ICD-10-CM | POA: Diagnosis not present

## 2019-03-21 DIAGNOSIS — C3411 Malignant neoplasm of upper lobe, right bronchus or lung: Secondary | ICD-10-CM | POA: Diagnosis not present

## 2019-03-21 DIAGNOSIS — N183 Chronic kidney disease, stage 3 unspecified: Secondary | ICD-10-CM | POA: Diagnosis not present

## 2019-03-21 DIAGNOSIS — I6529 Occlusion and stenosis of unspecified carotid artery: Secondary | ICD-10-CM | POA: Diagnosis not present

## 2019-03-21 DIAGNOSIS — R42 Dizziness and giddiness: Secondary | ICD-10-CM | POA: Diagnosis not present

## 2019-03-21 DIAGNOSIS — I773 Arterial fibromuscular dysplasia: Secondary | ICD-10-CM | POA: Diagnosis not present

## 2019-03-21 DIAGNOSIS — I442 Atrioventricular block, complete: Secondary | ICD-10-CM | POA: Diagnosis not present

## 2019-03-21 DIAGNOSIS — I2510 Atherosclerotic heart disease of native coronary artery without angina pectoris: Secondary | ICD-10-CM | POA: Diagnosis not present

## 2019-03-24 ENCOUNTER — Other Ambulatory Visit: Payer: Self-pay

## 2019-03-24 ENCOUNTER — Ambulatory Visit (INDEPENDENT_AMBULATORY_CARE_PROVIDER_SITE_OTHER): Payer: Medicare HMO | Admitting: Family Medicine

## 2019-03-24 ENCOUNTER — Encounter: Payer: Self-pay | Admitting: Family Medicine

## 2019-03-24 VITALS — BP 120/62 | HR 64 | Temp 98.8°F | Resp 14 | Ht 67.0 in | Wt 143.0 lb

## 2019-03-24 DIAGNOSIS — E46 Unspecified protein-calorie malnutrition: Secondary | ICD-10-CM

## 2019-03-24 DIAGNOSIS — E1159 Type 2 diabetes mellitus with other circulatory complications: Secondary | ICD-10-CM

## 2019-03-24 DIAGNOSIS — E538 Deficiency of other specified B group vitamins: Secondary | ICD-10-CM

## 2019-03-24 DIAGNOSIS — N1831 Chronic kidney disease, stage 3a: Secondary | ICD-10-CM | POA: Diagnosis not present

## 2019-03-24 DIAGNOSIS — I739 Peripheral vascular disease, unspecified: Secondary | ICD-10-CM

## 2019-03-24 DIAGNOSIS — J449 Chronic obstructive pulmonary disease, unspecified: Secondary | ICD-10-CM | POA: Diagnosis not present

## 2019-03-24 DIAGNOSIS — I714 Abdominal aortic aneurysm, without rupture, unspecified: Secondary | ICD-10-CM

## 2019-03-24 DIAGNOSIS — R4189 Other symptoms and signs involving cognitive functions and awareness: Secondary | ICD-10-CM

## 2019-03-24 DIAGNOSIS — R2681 Unsteadiness on feet: Secondary | ICD-10-CM | POA: Diagnosis not present

## 2019-03-24 DIAGNOSIS — N183 Chronic kidney disease, stage 3 unspecified: Secondary | ICD-10-CM | POA: Diagnosis not present

## 2019-03-24 DIAGNOSIS — E1122 Type 2 diabetes mellitus with diabetic chronic kidney disease: Secondary | ICD-10-CM | POA: Diagnosis not present

## 2019-03-24 DIAGNOSIS — G6289 Other specified polyneuropathies: Secondary | ICD-10-CM

## 2019-03-24 DIAGNOSIS — E1140 Type 2 diabetes mellitus with diabetic neuropathy, unspecified: Secondary | ICD-10-CM

## 2019-03-24 DIAGNOSIS — D649 Anemia, unspecified: Secondary | ICD-10-CM

## 2019-03-24 LAB — URINALYSIS, ROUTINE W REFLEX MICROSCOPIC
Bilirubin Urine: NEGATIVE
Glucose, UA: NEGATIVE
Hgb urine dipstick: NEGATIVE
Ketones, ur: NEGATIVE
Leukocytes,Ua: NEGATIVE
Nitrite: NEGATIVE
Protein, ur: NEGATIVE
Specific Gravity, Urine: 1.02 (ref 1.001–1.03)
pH: 5.5 (ref 5.0–8.0)

## 2019-03-24 NOTE — Patient Instructions (Addendum)
Referral to vascular and vein- Dr. Scot Dock We will call with lab results  F/U 3 months

## 2019-03-24 NOTE — Progress Notes (Signed)
Subjective:    Patient ID: Charlotte Henry, female    DOB: 1940/10/28, 78 y.o.   MRN: [id number]  Patient presents for Follow-up (is not fasting- states that she only had some ensure)   Pt for intermin follow up .  She states that she is doing well.  She is not sure why she is here for the visit.  This was to follow-up her weight as well as her meds and the weak spells that she had been having.  She has CT scan done to recheck on her cancer there was no sign of recurrence but she does have abdominal aortic aneurysm that needs to be followed by vascular.  She has significant atherosclerosis which I increase her Lipitor to 20 mg she typically takes this 3 times a week.  She also has COPD emphysema noted on the scans but that was not new.  Her appetite is better she states that she is eating most days at least twice a day she is also drinking her Ensure.  She not want her daughter back in the visit with her but I did speak to her daughter alone in the waiting room.  Her daughter states they are concerned about her memory she was driving the other day and got lost in [place].  Daughter had to go pick her up.  Also when in the store sometimes she forgets her pin number.  But she refused to let the intake of her finances at this time.  She also refused to move in with anyone.  She has not had any recent falls and overall is doing okay within her home. Family is concerned about some dementia developing.     Review Of Systems:  GEN- denies fatigue, fever, weight loss,weakness, recent illness HEENT- denies eye drainage, change in vision, nasal discharge, CVS- denies chest pain, palpitations RESP- denies SOB, cough, wheeze ABD- denies N/V, change in stools, abd pain GU- denies dysuria, hematuria, dribbling, incontinence MSK- denies joint pain, muscle aches, injury Neuro- denies headache, dizziness, syncope, seizure activity       Objective:    BP 120/62   Pulse 64   Temp 98.8 F (37.1 C)  (Oral)   Resp 14   Ht 5\' 7"  (1.702 m)   Wt 143 lb (64.9 kg)   SpO2 96%   BMI 22.40 kg/m  GEN- NAD, alert and oriented x3 HEENT- PERRL, EOMI, non injected sclera, pink conjunctiva, MMM, oropharynx clear Neck- Supple, no thyromegaly Neck- Supple, no thyromegaly CVS- 2/6 SEM no murmur RESP-CTAB ABD-NABS,soft,NT,ND EXT- No edema,  Pulses- Radial, DP-diminished Neuro-CNII-XII in tact walks with cane, no new deficits  Mini-Mental status exam 26 out of 30      Assessment & Plan:      Problem List Items Addressed This Visit      Unprioritized   AAA (abdominal aortic aneurysm) ([place])    Referral to vascular to reestablish care she has known peripheral vascular disease but also AAA      Relevant Orders   Ambulatory referral to Vascular Surgery   B12 deficiency   Relevant Orders   Iron, TIBC and Ferritin Panel (Completed)   Folate (Completed)   Vitamin B12 (Completed)   Chronic kidney disease (CKD), stage III (moderate)   Relevant Orders   Comprehensive metabolic panel (Completed)   Urinalysis, Routine w reflex microscopic (Completed)   Cognitive change    He has cognitive changes.  Based on her history I would be concerned for more vascular dementia.  She does have known widespread atherosclerosis.  Her CT scan recently done in August showed atrophy with chronic microvascular changes.  She has other comorbidities that could also contribute to her memory changes.  She has anemia B12 deficiency I will recheck the level today she does have follow-up with oncology.  She has a known hypertension she is a smoker.  She may benefit for something like Aricept states that she is open to taking this.  But I will look at her labs first.        Gait instability    He is using her cane daily.  She has not had any recent falls.      Normocytic anemia   Relevant Orders   Iron, TIBC and Ferritin Panel (Completed)   Folate (Completed)   Peripheral neuropathy   Protein-calorie  malnutrition ([place])    She has maintained her weight since the last visit.  She is trying to eat more calories.  She continue with the Ensure.      PVD (peripheral vascular disease) ([place]) - Primary   Relevant Orders   Ambulatory referral to Vascular Surgery   Type 2 diabetes mellitus with diabetic neuropathy, unspecified ([place])    Diet controlled at this time recheck her A1c.         Note: This dictation was prepared with Dragon dictation along with smaller phrase technology. Any transcriptional errors that result from this process are unintentional.

## 2019-03-25 ENCOUNTER — Encounter: Payer: Self-pay | Admitting: Family Medicine

## 2019-03-25 ENCOUNTER — Ambulatory Visit (HOSPITAL_COMMUNITY): Payer: Medicare HMO | Admitting: Hematology

## 2019-03-25 DIAGNOSIS — I773 Arterial fibromuscular dysplasia: Secondary | ICD-10-CM | POA: Diagnosis not present

## 2019-03-25 DIAGNOSIS — I739 Peripheral vascular disease, unspecified: Secondary | ICD-10-CM | POA: Diagnosis not present

## 2019-03-25 DIAGNOSIS — I2510 Atherosclerotic heart disease of native coronary artery without angina pectoris: Secondary | ICD-10-CM | POA: Diagnosis not present

## 2019-03-25 DIAGNOSIS — I129 Hypertensive chronic kidney disease with stage 1 through stage 4 chronic kidney disease, or unspecified chronic kidney disease: Secondary | ICD-10-CM | POA: Diagnosis not present

## 2019-03-25 DIAGNOSIS — I6529 Occlusion and stenosis of unspecified carotid artery: Secondary | ICD-10-CM | POA: Diagnosis not present

## 2019-03-25 DIAGNOSIS — R4189 Other symptoms and signs involving cognitive functions and awareness: Secondary | ICD-10-CM | POA: Insufficient documentation

## 2019-03-25 DIAGNOSIS — R42 Dizziness and giddiness: Secondary | ICD-10-CM | POA: Diagnosis not present

## 2019-03-25 DIAGNOSIS — C3411 Malignant neoplasm of upper lobe, right bronchus or lung: Secondary | ICD-10-CM | POA: Diagnosis not present

## 2019-03-25 DIAGNOSIS — N183 Chronic kidney disease, stage 3 unspecified: Secondary | ICD-10-CM | POA: Diagnosis not present

## 2019-03-25 DIAGNOSIS — I442 Atrioventricular block, complete: Secondary | ICD-10-CM | POA: Diagnosis not present

## 2019-03-25 LAB — COMPREHENSIVE METABOLIC PANEL
AG Ratio: 1.4 (calc) (ref 1.0–2.5)
ALT: 11 U/L (ref 6–29)
AST: 22 U/L (ref 10–35)
Albumin: 3.8 g/dL (ref 3.6–5.1)
Alkaline phosphatase (APISO): 46 U/L (ref 37–153)
BUN/Creatinine Ratio: 19 (calc) (ref 6–22)
BUN: 23 mg/dL (ref 7–25)
CO2: 24 mmol/L (ref 20–32)
Calcium: 9.3 mg/dL (ref 8.6–10.4)
Chloride: 108 mmol/L (ref 98–110)
Creat: 1.23 mg/dL — ABNORMAL HIGH (ref 0.60–0.93)
Globulin: 2.7 g/dL (calc) (ref 1.9–3.7)
Glucose, Bld: 90 mg/dL (ref 65–99)
Potassium: 5.2 mmol/L (ref 3.5–5.3)
Sodium: 141 mmol/L (ref 135–146)
Total Bilirubin: 0.3 mg/dL (ref 0.2–1.2)
Total Protein: 6.5 g/dL (ref 6.1–8.1)

## 2019-03-25 LAB — IRON,TIBC AND FERRITIN PANEL
%SAT: 31 % (calc) (ref 16–45)
Ferritin: 476 ng/mL — ABNORMAL HIGH (ref 16–288)
Iron: 62 ug/dL (ref 45–160)
TIBC: 202 mcg/dL (calc) — ABNORMAL LOW (ref 250–450)

## 2019-03-25 LAB — CBC WITH DIFFERENTIAL/PLATELET
Absolute Monocytes: 680 cells/uL (ref 200–950)
Basophils Absolute: 40 cells/uL (ref 0–200)
Basophils Relative: 0.6 %
Eosinophils Absolute: 92 cells/uL (ref 15–500)
Eosinophils Relative: 1.4 %
HCT: 30.2 % — ABNORMAL LOW (ref 35.0–45.0)
Hemoglobin: 10.1 g/dL — ABNORMAL LOW (ref 11.7–15.5)
Lymphs Abs: 2422 cells/uL (ref 850–3900)
MCH: 30 pg (ref 27.0–33.0)
MCHC: 33.4 g/dL (ref 32.0–36.0)
MCV: 89.6 fL (ref 80.0–100.0)
MPV: 10.5 fL (ref 7.5–12.5)
Monocytes Relative: 10.3 %
Neutro Abs: 3366 cells/uL (ref 1500–7800)
Neutrophils Relative %: 51 %
Platelets: 223 10*3/uL (ref 140–400)
RBC: 3.37 10*6/uL — ABNORMAL LOW (ref 3.80–5.10)
RDW: 13.4 % (ref 11.0–15.0)
Total Lymphocyte: 36.7 %
WBC: 6.6 10*3/uL (ref 3.8–10.8)

## 2019-03-25 LAB — VITAMIN B12: Vitamin B-12: >2000 pg/mL — ABNORMAL HIGH (ref 200–1100)

## 2019-03-25 LAB — HEMOGLOBIN A1C
Hgb A1c MFr Bld: 5.9 % of total Hgb — ABNORMAL HIGH (ref <5.7)
Mean Plasma Glucose: 123 (calc)
eAG (mmol/L): 6.8 (calc)

## 2019-03-25 LAB — MICROALBUMIN / CREATININE URINE RATIO
Creatinine, Urine: 158 mg/dL (ref 20–275)
Microalb Creat Ratio: 3 mcg/mg creat (ref <30)
Microalb, Ur: 0.5 mg/dL

## 2019-03-25 LAB — FOLATE: Folate: 16.3 ng/mL

## 2019-03-25 NOTE — Assessment & Plan Note (Signed)
She has maintained her weight since the last visit.  She is trying to eat more calories.  She continue with the Ensure.

## 2019-03-25 NOTE — Assessment & Plan Note (Signed)
Diet controlled at this time recheck her A1c.

## 2019-03-25 NOTE — Assessment & Plan Note (Signed)
He is using her cane daily.  She has not had any recent falls.

## 2019-03-25 NOTE — Assessment & Plan Note (Signed)
He has cognitive changes.  Based on her history I would be concerned for more vascular dementia.  She does have known widespread atherosclerosis.  Her CT scan recently done in August showed atrophy with chronic microvascular changes.  She has other comorbidities that could also contribute to her memory changes.  She has anemia B12 deficiency I will recheck the level today she does have follow-up with oncology.  She has a known hypertension she is a smoker.  She may benefit for something like Aricept states that she is open to taking this.  But I will look at her labs first.

## 2019-03-25 NOTE — Assessment & Plan Note (Signed)
Referral to vascular to reestablish care she has known peripheral vascular disease but also AAA

## 2019-03-27 ENCOUNTER — Encounter: Payer: Self-pay | Admitting: *Deleted

## 2019-03-27 ENCOUNTER — Other Ambulatory Visit: Payer: Self-pay | Admitting: *Deleted

## 2019-03-27 MED ORDER — DONEPEZIL HCL 5 MG PO TABS: 5.0000 mg | ORAL_TABLET | Freq: Every day | ORAL | 3 refills | Status: DC

## 2019-04-02 ENCOUNTER — Inpatient Hospital Stay (HOSPITAL_COMMUNITY): Payer: Medicare HMO

## 2019-04-09 ENCOUNTER — Other Ambulatory Visit: Payer: Self-pay

## 2019-04-09 ENCOUNTER — Inpatient Hospital Stay (HOSPITAL_COMMUNITY): Payer: Medicare HMO | Attending: Hematology | Admitting: Hematology

## 2019-04-09 DIAGNOSIS — C3411 Malignant neoplasm of upper lobe, right bronchus or lung: Secondary | ICD-10-CM

## 2019-04-10 NOTE — Progress Notes (Signed)
I connected with Charlotte Henry on 04/10/19 at  1:20 PM EST by telephone visit and verified that I am speaking with the correct person using two identifiers.   I discussed the limitations, risks, security and privacy concerns of performing an evaluation and management service by telemedicine and the availability of in-person appointments. I also discussed with the patient that there may be a patient responsible charge related to this service. The patient expressed understanding and agreed to proceed.     Patient's location: Home  Provider's location: [facility]    Chief Complaint: Follow-up of iron deficiency anemia and stage IV lung cancer.  She reports overall doing well.  She denies any significant fatigue.  She denies any obvious signs of bleeding.  She denies any changes in bowel habits.  No weight loss.  Appetite is stable.  She denies any new cough no hemoptysis.  No shortness of breath, lightheadedness or dizziness, or chest pain.  She does continue to smoke 5 cigarettes a day.  She states she is actively trying to stop smoking.  Currently Dr. Scarlett Presto is following her with Chest  CT scans.  No physical exam performed due to telephone visit.   Assessment and plan: 1.  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 -Diagnosed in August 2019.  She underwent stereotactic radiation 3-5 treatments.  Patient was not a surgical candidate. -Most recent CT chest performed September 2020 showed focal area of articulatural distortion in the medial aspect of the right upper lobe which was unchanged and is most likely secondary to radiation changes.  There were no suspicions for residual, recurrent, or metastatic disease..  Bronchial wall thickening was noted consistent with emphysema. -Patient should continue follow-ups with CT chest in 6 months.  2.  Normocytic anemia most likely secondary to chronic kidney disease. -Hemoglobin is stable at 10.1, MCV 89.6, iron within normal at 62 saturation 31,  ferritin elevated at 476 -No interventions indicated at this time.  We will continue to monitor.  Roger Shelter

## 2019-04-14 ENCOUNTER — Ambulatory Visit (INDEPENDENT_AMBULATORY_CARE_PROVIDER_SITE_OTHER): Payer: Medicare HMO | Admitting: *Deleted

## 2019-04-14 DIAGNOSIS — I442 Atrioventricular block, complete: Secondary | ICD-10-CM

## 2019-04-15 LAB — CUP PACEART REMOTE DEVICE CHECK
Battery Remaining Longevity: 106 mo
Battery Remaining Percentage: 95.5 %
Battery Voltage: 2.98 V
Brady Statistic AP VP Percent: 68 %
Brady Statistic AP VS Percent: 1 %
Brady Statistic AS VP Percent: 32 %
Brady Statistic AS VS Percent: 1 %
Brady Statistic RA Percent Paced: 67 %
Brady Statistic RV Percent Paced: 99 %
Date Time Interrogation Session: 20201110135101
Implantable Lead Implant Date: 20171020
Implantable Lead Implant Date: 20171020
Implantable Lead Location: 753859
Implantable Lead Location: 753860
Implantable Pulse Generator Implant Date: 20171020
Lead Channel Impedance Value: 380 Ohm
Lead Channel Impedance Value: 410 Ohm
Lead Channel Pacing Threshold Amplitude: 0.5 V
Lead Channel Pacing Threshold Amplitude: 0.75 V
Lead Channel Pacing Threshold Pulse Width: 0.4 ms
Lead Channel Pacing Threshold Pulse Width: 0.4 ms
Lead Channel Sensing Intrinsic Amplitude: 1.8 mV
Lead Channel Sensing Intrinsic Amplitude: 11.2 mV
Lead Channel Setting Pacing Amplitude: 2 V
Lead Channel Setting Pacing Amplitude: 2.5 V
Lead Channel Setting Pacing Pulse Width: 0.4 ms
Lead Channel Setting Sensing Sensitivity: 3.5 mV
Pulse Gen Model: 2272
Pulse Gen Serial Number: 3180497

## 2019-04-22 ENCOUNTER — Other Ambulatory Visit: Payer: Self-pay | Admitting: Family Medicine

## 2019-04-22 NOTE — Telephone Encounter (Signed)
Ok to refill??  Last office visit 03/24/2019.  Last refill 06/25/2018.

## 2019-05-05 ENCOUNTER — Other Ambulatory Visit: Payer: Self-pay | Admitting: *Deleted

## 2019-05-05 ENCOUNTER — Telehealth: Payer: Self-pay | Admitting: *Deleted

## 2019-05-05 MED ORDER — MECLIZINE HCL 25 MG PO TABS: 25.0000 mg | ORAL_TABLET | Freq: Four times a day (QID) | ORAL | 0 refills | Status: DC | PRN

## 2019-05-05 NOTE — Telephone Encounter (Signed)
noted 

## 2019-05-05 NOTE — Telephone Encounter (Signed)
Received call from patient.   States that she continues to have dizziness and requested refill on Meclizine. Prescription sent to pharmacy.   Also reports dependent edema to BLE, specifically the ankles and feet. States that the right foot > left foot. Advised to elevate BLE while resting.   Reports that she also has increased joint pain in hands. Advised to schedule OV for MD to evaluate. States that she has no transportation at this time. Requested telehealth visit.   Appointment scheduled for 05/07/2019.

## 2019-05-07 ENCOUNTER — Ambulatory Visit (INDEPENDENT_AMBULATORY_CARE_PROVIDER_SITE_OTHER): Payer: Medicare HMO | Admitting: Family Medicine

## 2019-05-07 ENCOUNTER — Other Ambulatory Visit: Payer: Self-pay

## 2019-05-07 ENCOUNTER — Encounter: Payer: Self-pay | Admitting: Family Medicine

## 2019-05-07 DIAGNOSIS — R609 Edema, unspecified: Secondary | ICD-10-CM | POA: Diagnosis not present

## 2019-05-07 DIAGNOSIS — N1831 Chronic kidney disease, stage 3a: Secondary | ICD-10-CM | POA: Diagnosis not present

## 2019-05-07 MED ORDER — FUROSEMIDE 20 MG PO TABS: 20.0000 mg | ORAL_TABLET | Freq: Every day | ORAL | 0 refills | Status: DC | PRN

## 2019-05-07 NOTE — Progress Notes (Signed)
Virtual Visit via Telephone Note  I connected with Charlotte Henry on 05/07/19 at 1:10pm  by telephone and verified that I am speaking with the correct person using two identifiers.       Pt location: at home   Physician location:  In office, [facility], [name] MD     On call: patient and physician   I discussed the limitations, risks, security and privacy concerns of performing an evaluation and management service by telephone and the availability of in person appointments. I also discussed with the patient that there may be a patient responsible charge related to this service. The patient expressed understanding and agreed to proceed.   History of Present Illness: Legs swelling to mid calf for the past 4 days, cutting back on salt, she has been elevating them but it has not improved.  She denies any pain but they just feel tight.  She denies any change in her breathing no chest pain.  She does not have a scale at home so she is unsure how much she weighs but states her blood pressure has been good.  She had f/u with oncology ,. No evidence of recurrence of her lung cancer at this time.  She has a follow-up scan in 6 months  No  Difficulty urinating, no no bowel problems Appetite improved, states she is eating twice a day and ensure   She has history of vertigo she had some dizziness the past 2 nights she took her meclizine at bedtime and that has resolved.     Observations/Objective: BP 123/70 no acute distress noted over the phone unable to visualize her.  Assessment and Plan: Peripheral edema but without any other cardiovascular symptoms such as shortness of breath or chest pain.  Unfortunately unable to visualize her she could not do video call and she was unable to come into the office.  I will go ahead and preemptively treat her with Lasix 20 mg once a day as she does have CKD last creatinine 1.23.  We will have her take the Lasix for 1 week elevate the feet  cut out the sodium and see if her swelling resolves.  If her swelling has not improved she is going to find a way to get her daughter to bring her in for a visit. Not appear in any distress while on the telephone. He did have an echo performed in August which did show mild LVH and mild diastolic dysfunction her ejection fraction was preserved  Vertigo has resolved  Follow Up Instructions:    I discussed the assessment and treatment plan with the patient. The patient was provided an opportunity to ask questions and all were answered. The patient agreed with the plan and demonstrated an understanding of the instructions.   The patient was advised to call back or seek an in-person evaluation if the symptoms worsen or if the condition fails to improve as anticipated.  I provided 11 minutes of non-face-to-face time during this encounter. End Time 1:21pm  [name], MD

## 2019-05-14 NOTE — Progress Notes (Signed)
Remote pacemaker transmission.   

## 2019-05-19 ENCOUNTER — Telehealth: Payer: Self-pay | Admitting: Family Medicine

## 2019-05-19 NOTE — Telephone Encounter (Signed)
Call placed to patient.   Reports that edema has not improved. Appointment scheduled for 05/23/2019.

## 2019-05-19 NOTE — Telephone Encounter (Signed)
Noted, needs OV

## 2019-05-19 NOTE — Telephone Encounter (Signed)
Patient requesting to talk with you. She wouldn't give any details.  CB# [phone]

## 2019-05-22 ENCOUNTER — Other Ambulatory Visit: Payer: Medicare HMO

## 2019-05-23 ENCOUNTER — Other Ambulatory Visit: Payer: Self-pay

## 2019-05-23 ENCOUNTER — Ambulatory Visit: Payer: Self-pay | Admitting: Family Medicine

## 2019-05-23 ENCOUNTER — Ambulatory Visit: Payer: Medicare HMO | Attending: Internal Medicine

## 2019-05-23 DIAGNOSIS — Z20822 Contact with and (suspected) exposure to covid-19: Secondary | ICD-10-CM

## 2019-05-23 DIAGNOSIS — Z20828 Contact with and (suspected) exposure to other viral communicable diseases: Secondary | ICD-10-CM | POA: Diagnosis not present

## 2019-05-24 LAB — NOVEL CORONAVIRUS, NAA: SARS-CoV-2, NAA: NOT DETECTED

## 2019-05-26 ENCOUNTER — Telehealth: Payer: Self-pay | Admitting: *Deleted

## 2019-05-26 NOTE — Telephone Encounter (Signed)
Patient called given negative covid results . 

## 2019-06-03 ENCOUNTER — Other Ambulatory Visit: Payer: Self-pay

## 2019-06-03 ENCOUNTER — Ambulatory Visit (INDEPENDENT_AMBULATORY_CARE_PROVIDER_SITE_OTHER): Payer: Medicare HMO | Admitting: Family Medicine

## 2019-06-03 ENCOUNTER — Encounter: Payer: Self-pay | Admitting: Family Medicine

## 2019-06-03 ENCOUNTER — Emergency Department (HOSPITAL_COMMUNITY)
Admission: EM | Admit: 2019-06-03 | Discharge: 2019-06-04 | Disposition: A | Discharge status: Home / Self Care | Payer: Medicare HMO | Attending: Emergency Medicine | Admitting: Emergency Medicine

## 2019-06-03 ENCOUNTER — Emergency Department (HOSPITAL_COMMUNITY): Payer: Medicare HMO

## 2019-06-03 VITALS — BP 102/70 | HR 40 | Temp 97.6°F | Wt 144.5 lb

## 2019-06-03 DIAGNOSIS — S40022A Contusion of left upper arm, initial encounter: Secondary | ICD-10-CM | POA: Insufficient documentation

## 2019-06-03 DIAGNOSIS — M79621 Pain in right upper arm: Secondary | ICD-10-CM | POA: Diagnosis not present

## 2019-06-03 DIAGNOSIS — W01198A Fall on same level from slipping, tripping and stumbling with subsequent striking against other object, initial encounter: Secondary | ICD-10-CM | POA: Insufficient documentation

## 2019-06-03 DIAGNOSIS — R600 Localized edema: Secondary | ICD-10-CM | POA: Diagnosis not present

## 2019-06-03 DIAGNOSIS — I442 Atrioventricular block, complete: Secondary | ICD-10-CM

## 2019-06-03 DIAGNOSIS — W19XXXA Unspecified fall, initial encounter: Secondary | ICD-10-CM | POA: Diagnosis not present

## 2019-06-03 DIAGNOSIS — N183 Chronic kidney disease, stage 3 unspecified: Secondary | ICD-10-CM | POA: Diagnosis not present

## 2019-06-03 DIAGNOSIS — E440 Moderate protein-calorie malnutrition: Secondary | ICD-10-CM

## 2019-06-03 DIAGNOSIS — I739 Peripheral vascular disease, unspecified: Secondary | ICD-10-CM | POA: Diagnosis not present

## 2019-06-03 DIAGNOSIS — Z85118 Personal history of other malignant neoplasm of bronchus and lung: Secondary | ICD-10-CM | POA: Insufficient documentation

## 2019-06-03 DIAGNOSIS — I2510 Atherosclerotic heart disease of native coronary artery without angina pectoris: Secondary | ICD-10-CM | POA: Insufficient documentation

## 2019-06-03 DIAGNOSIS — Y929 Unspecified place or not applicable: Secondary | ICD-10-CM | POA: Insufficient documentation

## 2019-06-03 DIAGNOSIS — R0600 Dyspnea, unspecified: Secondary | ICD-10-CM

## 2019-06-03 DIAGNOSIS — M25473 Effusion, unspecified ankle: Secondary | ICD-10-CM

## 2019-06-03 DIAGNOSIS — Z79899 Other long term (current) drug therapy: Secondary | ICD-10-CM | POA: Insufficient documentation

## 2019-06-03 DIAGNOSIS — I129 Hypertensive chronic kidney disease with stage 1 through stage 4 chronic kidney disease, or unspecified chronic kidney disease: Secondary | ICD-10-CM | POA: Insufficient documentation

## 2019-06-03 DIAGNOSIS — Y939 Activity, unspecified: Secondary | ICD-10-CM | POA: Diagnosis not present

## 2019-06-03 DIAGNOSIS — J449 Chronic obstructive pulmonary disease, unspecified: Secondary | ICD-10-CM | POA: Insufficient documentation

## 2019-06-03 DIAGNOSIS — S4991XA Unspecified injury of right shoulder and upper arm, initial encounter: Secondary | ICD-10-CM

## 2019-06-03 DIAGNOSIS — R609 Edema, unspecified: Secondary | ICD-10-CM | POA: Diagnosis not present

## 2019-06-03 DIAGNOSIS — Z950 Presence of cardiac pacemaker: Secondary | ICD-10-CM | POA: Diagnosis not present

## 2019-06-03 DIAGNOSIS — E1122 Type 2 diabetes mellitus with diabetic chronic kidney disease: Secondary | ICD-10-CM | POA: Diagnosis not present

## 2019-06-03 DIAGNOSIS — I509 Heart failure, unspecified: Secondary | ICD-10-CM

## 2019-06-03 DIAGNOSIS — M25511 Pain in right shoulder: Secondary | ICD-10-CM

## 2019-06-03 DIAGNOSIS — Y999 Unspecified external cause status: Secondary | ICD-10-CM | POA: Diagnosis not present

## 2019-06-03 DIAGNOSIS — Z03818 Encounter for observation for suspected exposure to other biological agents ruled out: Secondary | ICD-10-CM | POA: Diagnosis not present

## 2019-06-03 DIAGNOSIS — I10 Essential (primary) hypertension: Secondary | ICD-10-CM | POA: Diagnosis not present

## 2019-06-03 DIAGNOSIS — R0602 Shortness of breath: Secondary | ICD-10-CM | POA: Diagnosis not present

## 2019-06-03 LAB — COMPREHENSIVE METABOLIC PANEL
ALT: 12 U/L (ref 0–44)
AST: 24 U/L (ref 15–41)
Albumin: 3.9 g/dL (ref 3.5–5.0)
Alkaline Phosphatase: 52 U/L (ref 38–126)
Anion gap: 12 (ref 5–15)
BUN: 23 mg/dL (ref 8–23)
CO2: 20 mmol/L — ABNORMAL LOW (ref 22–32)
Calcium: 9.4 mg/dL (ref 8.9–10.3)
Chloride: 110 mmol/L (ref 98–111)
Creatinine, Ser: 1.11 mg/dL — ABNORMAL HIGH (ref 0.44–1.00)
GFR calc Af Amer: 55 mL/min — ABNORMAL LOW (ref >60)
GFR calc non Af Amer: 47 mL/min — ABNORMAL LOW (ref >60)
Glucose, Bld: 93 mg/dL (ref 70–99)
Potassium: 4.3 mmol/L (ref 3.5–5.1)
Sodium: 142 mmol/L (ref 135–145)
Total Bilirubin: 0.7 mg/dL (ref 0.3–1.2)
Total Protein: 7.4 g/dL (ref 6.5–8.1)

## 2019-06-03 LAB — CBC
HCT: 33.7 % — ABNORMAL LOW (ref 36.0–46.0)
Hemoglobin: 10.7 g/dL — ABNORMAL LOW (ref 12.0–15.0)
MCH: 29.8 pg (ref 26.0–34.0)
MCHC: 31.8 g/dL (ref 30.0–36.0)
MCV: 93.9 fL (ref 80.0–100.0)
Platelets: 215 10*3/uL (ref 150–400)
RBC: 3.59 MIL/uL — ABNORMAL LOW (ref 3.87–5.11)
RDW: 14 % (ref 11.5–15.5)
WBC: 6.1 10*3/uL (ref 4.0–10.5)
nRBC: 0 % (ref 0.0–0.2)

## 2019-06-03 LAB — BRAIN NATRIURETIC PEPTIDE: B Natriuretic Peptide: 71.6 pg/mL (ref 0.0–100.0)

## 2019-06-03 NOTE — Assessment & Plan Note (Addendum)
Differential diagnosis for her presenting symptoms of fluid retention generalized fatigue weakness includes CHF exacerbation however I am also concerned about possible Covid infection or pneumonia the setting of her having fever Christmas and through the weekend she also had multiple family members over though they did get Covid test before gathering.  Unclear with regards to her fluctuating oxygen saturation.  He does have underlying COPD as well.  Her heart rate looks okay as she is paced.  She is not having any chest pain and surprisingly looks fairly comfortable at rest  I still recommend evaluation in the emergency room.  Her daughter also agrees with this stating that she did look worse off at home.  She is also not been eating very well because of her fatigue.  To be sent to the closest hospital for chest x-ray EKG labs.  Status post fall before her visit she needs an x-ray of the right upper extremity to rule out fracture

## 2019-06-03 NOTE — Progress Notes (Signed)
Subjective:    Patient ID: Charlotte Henry, female    DOB: January 21, 1941, 78 y.o.   MRN: [id number]  Patient presents for Leg Swelling and Fall   Very complex  78  Year old AAF, history of non small cell lung cancer, heart block s/p pace maker, COPD, PVD, CKD, CAD, PAF,  protein malnutrition   Patient presents to the office secondary to leg swelling for the past 2 weeks.  But she is also had fever that started on Christmas Day 101 F. Fever last night was 99 something per patient and she took tylenol. No fever this AM. She had some cough and congestion symptoms.  She does have underlying COPD as well as peripheral vascular disease heart block she has a pacer.  She denies feeling short of breath but has been very weak and fatigued she has not been eating well per her daughter.  She denies any vomiting or diarrhea.  She is still urinating.  She has not been very confused.  When trying to get out of the house to come to her appointment she actually fell off the porch and landed on her right side she has pain in her right upper arm at this time.  Upon nurse evaluating her vital signs when she came into the room her oxygen saturation was difficult to read it was staying around 40% her heart rate was also 30s to 40s.  She was started on oxygen via nasal cannula O2 sat did not get above 60% up to 5 L so she was switched to nonrebreather.  Is able to get oxygen up to 90% but then it dropped back into the mid 80s.  She however is still sitting comfortably in the chair does not look short of breath at all.  Her heart rate by auscultation is in the 60s.  We did attempt to place EKG but was unable to get an accurate reading multiple times.  EMS was therefore called.   EMS arrived- oxygen sat 100 on non re-breather , Non re breather was slowly reduced, oxygen sat stayed in low 90's but didn't have consistent wave form       ECHO 01/31/19  The left ventricle has normal systolic function, with an ejection fraction of  55-60%. The cavity size was normal. Moderate basal septal hypertrophy. Otherwise, mild concentric LVH of remaining myocardium. Left ventricular diastolic Doppler parameters are consistent with impaired relaxation.   Review Of Systems:  GEN- denies fatigue, fever, weight loss,weakness, recent illness HEENT- denies eye drainage, change in vision, nasal discharge, CVS- denies chest pain, palpitations RESP- denies SOB, cough, wheeze ABD- denies N/V, change in stools, abd pain GU- denies dysuria, hematuria, dribbling, incontinence MSK- + joint pain, muscle aches, injury Neuro- denies headache, dizziness, syncope, seizure activity       Objective:    BP 102/70   Pulse (!) 40   Temp 97.6 F (36.4 C) (Oral)   Wt 144 lb 8 oz (65.5 kg)   SpO2 (!) 48%   BMI 22.63 kg/m  GEN- NAD, alert and oriented , sitting in wheelchair HEENT- PERRL, EOMI, non injected sclera, pink conjunctiva, MMM, oropharynx clear Neck- Supple,  CVS-bradycardia no murmur RESP-Mild decrease at bases, no wheeze, no bronchospasm  ABD-NABS,soft,NT,ND MSK- Decreased ROM Right uPPER ext, TTP mid humerous, pain with ROM, extension  EXT-1+ pitting edema to shin Pulses- Radial, DP- 2+        Assessment & Plan:      Problem List Items Addressed This  Visit      Unprioritized   Cardiovascular disease    Differential diagnosis for her presenting symptoms of fluid retention generalized fatigue weakness includes CHF exacerbation however I am also concerned about possible Covid infection or pneumonia the setting of her having fever Christmas and through the weekend she also had multiple family members over though they did get Covid test before gathering.  Unclear with regards to her fluctuating oxygen saturation.  He does have underlying COPD as well.  Her heart rate looks okay as she is paced.  She is not having any chest pain and surprisingly looks fairly comfortable at rest  I still recommend evaluation in the  emergency room.  Her daughter also agrees with this stating that she did look worse off at home.  She is also not been eating very well because of her fatigue.  To be sent to the closest hospital for chest x-ray EKG labs.      CHB (complete heart block) ([facility])   Protein-calorie malnutrition ([place])    Other Visit Diagnoses    Dyspnea, unspecified type    -  Primary   Relevant Orders   EKG 12-Lead   Congestive heart failure, unspecified HF chronicity, unspecified heart failure type ([place])       Acute pain of right shoulder       Fall, initial encounter          Note: This dictation was prepared with Dragon dictation along with smaller phrase technology. Any transcriptional errors that result from this process are unintentional.

## 2019-06-03 NOTE — ED Triage Notes (Signed)
Pt here from MD office with increased swelling in her ankles, pt had a fall at the office with she has some slight arm pain from fall after missing the hand rail

## 2019-06-04 ENCOUNTER — Emergency Department (HOSPITAL_COMMUNITY): Payer: Medicare HMO

## 2019-06-04 DIAGNOSIS — Z03818 Encounter for observation for suspected exposure to other biological agents ruled out: Secondary | ICD-10-CM | POA: Diagnosis not present

## 2019-06-04 DIAGNOSIS — R600 Localized edema: Secondary | ICD-10-CM | POA: Diagnosis not present

## 2019-06-04 DIAGNOSIS — S4991XA Unspecified injury of right shoulder and upper arm, initial encounter: Secondary | ICD-10-CM | POA: Diagnosis not present

## 2019-06-04 DIAGNOSIS — M79621 Pain in right upper arm: Secondary | ICD-10-CM | POA: Diagnosis not present

## 2019-06-04 DIAGNOSIS — I10 Essential (primary) hypertension: Secondary | ICD-10-CM | POA: Diagnosis not present

## 2019-06-04 LAB — URINALYSIS, ROUTINE W REFLEX MICROSCOPIC
Bacteria, UA: NONE SEEN
Bilirubin Urine: NEGATIVE
Glucose, UA: NEGATIVE mg/dL
Hgb urine dipstick: NEGATIVE
Ketones, ur: NEGATIVE mg/dL
Nitrite: NEGATIVE
Protein, ur: NEGATIVE mg/dL
Specific Gravity, Urine: 1.019 (ref 1.005–1.030)
pH: 5 (ref 5.0–8.0)

## 2019-06-04 MED ORDER — FUROSEMIDE 40 MG PO TABS: 40.0000 mg | ORAL_TABLET | Freq: Every day | ORAL | 0 refills | Status: DC

## 2019-06-04 MED ORDER — FUROSEMIDE 10 MG/ML IJ SOLN
40.0000 mg | Freq: Once | INTRAMUSCULAR | Status: DC
  Filled 2019-06-04: qty 4

## 2019-06-04 MED ORDER — FUROSEMIDE 20 MG PO TABS
40.0000 mg | ORAL_TABLET | Freq: Once | ORAL | Status: AC
  Administered 2019-06-04: 40 mg via ORAL
  Filled 2019-06-04: qty 2

## 2019-06-04 NOTE — Discharge Instructions (Addendum)
Starting tomorrow morning, please take 40 mg Lasix in the morning, instead of your typically prescribed 20 mg daily.  Do this for 3 days.  After this time, please resume your prescribed 20 mg daily.  Arm injury: You have been seen today for an arm injury. There were no acute abnormalities on the x-rays, including no sign of fracture or dislocation, however, there could be injuries to the soft tissues, such as the ligaments or tendons that are not seen on xrays. There could also be what are called occult fractures that are small fractures not seen on xray. Acetaminophen: May take acetaminophen (generic for Tylenol), as needed, for pain. Your daily total maximum amount of acetaminophen from all sources should be limited to 4000mg /day for persons without liver problems, or 2000mg /day for those with liver problems. Ice: May apply ice to the area over the next 24 hours for 15 minutes at a time to reduce swelling. Elevation: Keep the extremity elevated as often as possible to reduce pain and inflammation. Follow up: If symptoms are improving, you may follow up with your primary care provider for any continued management. If symptoms are not starting to improve within a week, you should follow up with the orthopedic specialist within two weeks. Return: Return to the ED for numbness, weakness, increasing pain, overall worsening symptoms, loss of function, or if symptoms are not improving, you have tried to follow up with the orthopedic specialist, and have been unable to do so.  For prescription assistance, may try using prescription discount sites or apps, such as goodrx.com

## 2019-06-04 NOTE — ED Notes (Signed)
Pt ambulates independently with steady gait to restroom.

## 2019-06-04 NOTE — ED Notes (Signed)
Patient verbalizes understanding of discharge instructions. Opportunity for questioning and answers were provided. Armband removed by staff, pt discharged from ED.  

## 2019-06-04 NOTE — ED Notes (Signed)
Pt refusing covid test

## 2019-06-04 NOTE — ED Provider Notes (Signed)
[facility] EMERGENCY DEPARTMENT Provider Note   CSN: [id number] Arrival date & time: 06/03/19  1742     History No chief complaint on file.   Charlotte Henry is a 78 y.o. female.  HPI      Charlotte Henry is a 78 y.o. female, with a history of COPD, CKD, hyperlipidemia, HTN, PVD, GERD, presenting to the ED with concern for bilateral ankle edema since around December 25. She does admit to dietary changes over the holidays, including high sodium foods such as country ham.  She initially tells me that she has been compliant with all her medications, however, during attending physician interview she admits to skipping her Lasix over the last several days. She was seen by her PCP yesterday for this issue.  PCP note indicates there was some difficulty with obtaining SPO2 reading on the patient with readings varying wildly 50- 90%, though patient showed no signs of dyspnea.  There was mention of concern for COVID-19 infection, however, patient did not voice any symptoms of this. Patient denies contact with known or suspected COVID-19 patients.  While leaving her house on the way to the doctor's office, patient reached out for the handrail, missed the handrail, and fell with her right upper arm against the handrail.  She notes bruising and tenderness, describes the pain as a soreness, mild to moderate, nonradiating.  Denies head injury, LOC, numbness, weakness, neck/back pain.  Patient denies fever/chills, cough, chest pain, shortness of breath, abdominal pain, N/V/D, lower extremity pain, orthopnea, dizziness, urinary symptoms, or any other complaints.   Past Medical History:  Diagnosis Date  . 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 ASCVD (arteriosclerotic cardiovascular disease)    a. cath in 4/04,-80%LAD; 70% RCA, -> DESx2; residual 60% distal RCA; nl EF  b. Nuc 02/2016 aborted due to bradycardia. Will need to be rescheduled   . Carotid artery occlusion   .  Cerebrovascular disease   . CHB (complete heart block) ([place])    a. s/p PPM on 03/24/16 with a St. Jude (serial number [id number]) pacemaker  . Chronic kidney disease (CKD), stage III (moderate)   . Clostridium difficile colitis 03/2013   a. 03/2013  . COPD (chronic obstructive pulmonary disease) ([place])   . Coronary disease   . Diverticulosis   . GERD (gastroesophageal reflux disease)   . Gout   . H pylori ulcer   . Hyperlipidemia    chose to stop statin, takes Zetia  . Hypertension    not currently on medication  . IBS (irritable bowel syndrome)   . PVD (peripheral vascular disease) ([place])    a. moderate to severely decreased ABI's in 8/08, sig. aortic inflow disease b. repeat ABI's in 2011 improved- mild disease on the left and moderate to severe disease on the right  . S/P placement of cardiac pacemaker    a. 03/24/16: St. Jude (serial number [id number]) pacemaker  . Symptomatic anemia 08/15/2016  . TIA (transient ischemic attack) 2012  . Tobacco abuse    a. 50 pack years continuing at 1/2 pack per day  . Tobacco use disorder 10/15/2015    Patient Active Problem List   Diagnosis Date Noted  . Cognitive change 03/25/2019  . B12 deficiency 03/24/2019  . AAA (abdominal aortic aneurysm) ([place]) 02/17/2019  . Vertigo 01/30/2019  . 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[place]) 01/10/2018  . Poor appetite 12/19/2017  . Itching 07/19/2017  . Gait instability 02/26/2017  . Symptomatic anemia 08/15/2016  .  Protein-calorie malnutrition ([place]) 08/04/2016  . Helicobacter pylori gastritis 07/26/2016  . Generalized weakness 07/09/2016  . Normocytic anemia 07/03/2016  . CHB (complete heart block) ([place])   . COPD (chronic obstructive pulmonary disease) ([place])   . Tobacco use disorder 10/15/2015  . Back pain 12/30/2014  . OA (osteoarthritis) 12/30/2014  . Carotid stenosis 03/11/2014  . PVD (peripheral vascular disease) ([place]) 03/11/2014  . Colon cancer screening 02/04/2014  . Overactive  bladder 12/31/2013  . Rash and nonspecific skin eruption 05/19/2013  . Numbness and tingling-bilat foot 03/05/2013  . IBS (irritable bowel syndrome) 12/30/2012  . Peripheral neuropathy 07/01/2012  . Insomnia 07/01/2012  . Chronic kidney disease (CKD), stage III (moderate) 08/25/2010  . Cardiovascular disease 08/16/2009  . Cerebrovascular disease 08/16/2009  . Peripheral vascular disease ([place]) 08/16/2009  . Type 2 diabetes mellitus with diabetic neuropathy, unspecified ([place]) 10/26/2008  . Hyperlipidemia 10/26/2008  . Gout, unspecified 10/26/2008  . Essential hypertension 10/26/2008  . Asthmatic bronchitis 10/26/2008  . Gastroesophageal reflux disease 10/26/2008    Past Surgical History:  Procedure Laterality Date  . ABDOMINAL HYSTERECTOMY    . carpel tunnel release     bilateral  . CHOLECYSTECTOMY    . COLONOSCOPY  2009  . COLONOSCOPY N/A 08/04/2016   incomplete due to prep.   . COLONOSCOPY N/A 08/05/2016   Dr. Oneida Alar: moderately redundant rectosigmoid and sigmoid, normal TI, single large-mouthed diverticulum in hepatic flexure, external hemorrhoids, path negative for microscopic colitis   . ENTEROSCOPY N/A 08/18/2016   Procedure: ENTEROSCOPY;  Surgeon: Daneil Dolin, MD;  Location: [facility];  Service: Endoscopy;  Laterality: N/A;  Pediatric colonoscope  . EP IMPLANTABLE DEVICE N/A 03/24/2016   Procedure: Pacemaker Implant;  Surgeon: [name], MD;  Location: [facility];  Service: Cardiovascular;  Laterality: N/A;  . ESOPHAGOGASTRODUODENOSCOPY N/A 08/04/2016   Dr. Oneida Alar: non-critical Schatzki's ring, small hiatal hernia, +H.pylori gastritis on path.   Marland Kitchen GIVENS CAPSULE STUDY N/A 08/16/2016   Procedure: GIVENS CAPSULE STUDY;  Surgeon: Daneil Dolin, MD;  Location: [facility];  Service: Endoscopy;  Laterality: N/A;  . TOTAL ABDOMINAL HYSTERECTOMY W/ BILATERAL SALPINGOOPHORECTOMY  1979     OB History    Gravida  2   Para  2   Term  2   Preterm      AB        Living        SAB      TAB      Ectopic      Multiple      Live Births              Family History  Problem Relation Age of Onset  . Liver disease Mother 28  . Hypertension Mother   . Cerebral aneurysm Father   . Aneurysm Father 60  . Cancer Sister   . Liver disease Brother   . Liver disease Brother   . Liver disease Brother   . Diabetes type II Brother   . Alcohol abuse Brother   . Colon cancer Neg Hx     Social History   Tobacco Use  . Smoking status: Current Every Day Smoker    Packs/day: 1.00    Years: 50.00    Pack years: 50.00    Types: Cigarettes    Start date: 02/02/1966  . Smokeless tobacco: Never Used  . Tobacco comment: 1/2 pack daily  Substance Use Topics  . Alcohol use: No    Alcohol/week: 0.0 standard  drinks    Comment: H/O of 6 pack per day quiting in 2003  . Drug use: No    Home Medications Prior to Admission medications   Medication Sig Start Date End Date Taking? Authorizing Provider  albuterol (PROVENTIL HFA;VENTOLIN HFA) 108 (90 Base) MCG/ACT inhaler Inhale 2 puffs into the lungs every 4 (four) hours as needed for wheezing or shortness of breath. Patient not taking: Reported on 04/09/2019 07/23/15   Alycia Rossetti, MD  ALPRAZolam Duanne Moron) 0.25 MG tablet TAKE 1 TABLET BY MOUTH AT BEDTIME AS NEEDED. Patient not taking: No sig reported 11/04/18   Alycia Rossetti, MD  atorvastatin (LIPITOR) 10 MG tablet Take 1 tablet (10 mg total) by mouth daily at 6 PM. Patient taking differently: Take 10 mg by mouth 3 (three) times a week.  02/17/19   Alycia Rossetti, MD  calcium citrate-vitamin D (CITRACAL+D) 315-200 MG-UNIT per tablet Take 1 tablet by mouth daily.      [provider]  Cyanocobalamin (VITAMIN B 12 PO) Take 1 tablet by mouth daily.     [provider]  donepezil (ARICEPT) 5 MG tablet Take 1 tablet (5 mg total) by mouth at bedtime. 03/27/19   Alycia Rossetti, MD  escitalopram (LEXAPRO) 10 MG tablet TAKE 1 TABLET  BY MOUTH ONCE A DAY. 04/22/19   [place], Modena Nunnery, MD  esomeprazole (NEXIUM) 40 MG capsule TAKE 1 CAPSULE BY MOUTH DAILY BEFORE BREAKFAST. Patient taking differently: Take 40 mg by mouth daily at 12 noon.  06/12/17   Mahala Menghini, PA-C  ezetimibe (ZETIA) 10 MG tablet Take 1 tablet (10 mg total) by mouth daily. 12/27/18   Alycia Rossetti, MD  ferrous sulfate 325 (65 FE) MG tablet Take 325 mg by mouth daily with breakfast.    [provider]  furosemide (LASIX) 20 MG tablet Take 1 tablet (20 mg total) by mouth daily as needed. Leg swelling 05/07/19   [place], Modena Nunnery, MD  furosemide (LASIX) 40 MG tablet Take 1 tablet (40 mg total) by mouth daily for 3 days. 06/05/19 06/08/19  Symphanie Cederberg C, PA-C  gabapentin (NEURONTIN) 100 MG capsule TAKE 1 TO 2 CAPSULES BY MOUTH AT BEDTIME FOR NERVE PAIN. Patient taking differently: Take 100 mg by mouth at bedtime.  12/27/18   Alycia Rossetti, MD  HYDROcodone-acetaminophen (NORCO/VICODIN) 5-325 MG tablet TAKE 1 TABLET BY MOUTH TWICE DAILY AS NEEDED FOR MODERATE PAIN. 04/22/19   Alycia Rossetti, MD  hydrOXYzine (ATARAX/VISTARIL) 25 MG tablet TAKE (1) TABLET BY MOUTH EVERY (8) HOURS AS NEEDED FOR ITCHING. Patient not taking: Reported on 06/03/2019 07/29/18   Susy Frizzle, MD  lidocaine (LIDODERM) 5 % Place 1 patch onto the skin daily. Remove & Discard patch within 12 hours or as directed by MD Patient not taking: Reported on 04/09/2019 06/26/18   Carole Civil, MD  meclizine (ANTIVERT) 25 MG tablet Take 1 tablet (25 mg total) by mouth every 6 (six) hours as needed for dizziness. 05/05/19   Alycia Rossetti, MD  Multiple Vitamin (MULTIVITAMIN WITH MINERALS) TABS tablet Take 1 tablet by mouth daily. 08/05/16   Johnson, Clanford L, MD  ondansetron (ZOFRAN ODT) 4 MG disintegrating tablet Take 1 tablet (4 mg total) by mouth every 8 (eight) hours as needed for nausea or vomiting. 01/27/19   Alycia Rossetti, MD    Allergies    Ace inhibitors, Aspirin,  Codeine, Pineapple, Plasticized base [plastibase], Ultram [tramadol], Adhesive [tape], Naprosyn [naproxen], Biaxin [clarithromycin], Clindamycin/lincomycin,  Linzess [linaclotide], Metronidazole, Nsaids, Penicillins, Varenicline tartrate, and Xarelto [rivaroxaban]  Review of Systems   Review of Systems  Constitutional: Negative for chills, diaphoresis and fever.  Respiratory: Negative for cough and shortness of breath.   Cardiovascular: Positive for leg swelling. Negative for chest pain.  Gastrointestinal: Negative for abdominal pain, diarrhea, nausea and vomiting.  Genitourinary: Negative for dysuria, frequency and hematuria.  Musculoskeletal: Negative for back pain and neck pain.       Right upper arm pain  Neurological: Negative for dizziness, syncope, weakness and numbness.  All other systems reviewed and are negative.   Physical Exam Updated Vital Signs BP (!) 154/63 (BP Location: Left Arm)   Pulse 60   Temp 98.2 F (36.8 C) (Oral)   Resp 15   SpO2 99%   Physical Exam Vitals and nursing note reviewed.  Constitutional:      General: She is not in acute distress.    Appearance: She is well-developed. She is not diaphoretic.  HENT:     Head: Normocephalic and atraumatic.     Mouth/Throat:     Mouth: Mucous membranes are moist.     Pharynx: Oropharynx is clear.  Eyes:     Conjunctiva/sclera: Conjunctivae normal.  Cardiovascular:     Rate and Rhythm: Normal rate and regular rhythm.     Pulses: Normal pulses.          Radial pulses are 2+ on the right side and 2+ on the left side.       Posterior tibial pulses are 2+ on the right side and 2+ on the left side.     Heart sounds: Normal heart sounds.     Comments: Tactile temperature in the extremities appropriate and equal bilaterally. Pulmonary:     Effort: Pulmonary effort is normal. No respiratory distress.     Breath sounds: Normal breath sounds.  Abdominal:     Palpations: Abdomen is soft.     Tenderness: There is no  abdominal tenderness. There is no guarding.  Musculoskeletal:     Cervical back: Neck supple.     Right lower leg: Edema present.     Left lower leg: Edema present.     Comments: Very mild, nonpitting ankle edema, nontender, equal bilaterally. No pain with range of motion of the bilateral ankles or knees.  Patient does have tenderness and some mild bruising to the right humeral region of the right upper arm.  No noted deformity or instability. No tenderness, swelling, deformity, or instability to the right hand, forearm, shoulder, elbow, or wrist.  No pain with range of motion in these joints.  Normal motor function intact in all extremities. No midline spinal tenderness.   Lymphadenopathy:     Cervical: No cervical adenopathy.  Skin:    General: Skin is warm and dry.  Neurological:     Mental Status: She is alert.     Comments: Sensation grossly intact to light touch in the extremities.  Grip strengths equal bilaterally.  Strength 5/5 in all extremities. No gait disturbance. Coordination intact. Cranial nerves III-XII grossly intact. No facial droop.   Psychiatric:        Mood and Affect: Mood and affect normal.        Speech: Speech normal.        Behavior: Behavior normal.     ED Results / Procedures / Treatments   Labs (all labs ordered are listed, but only abnormal results are displayed) Labs Reviewed  CBC - Abnormal; Notable for  the following components:      Result Value   RBC 3.59 (*)    Hemoglobin 10.7 (*)    HCT 33.7 (*)    All other components within normal limits  COMPREHENSIVE METABOLIC PANEL - Abnormal; Notable for the following components:   CO2 20 (*)    Creatinine, Ser 1.11 (*)    GFR calc non Af Amer 47 (*)    GFR calc Af Amer 55 (*)    All other components within normal limits  URINALYSIS, ROUTINE W REFLEX MICROSCOPIC - Abnormal; Notable for the following components:   APPearance HAZY (*)    Leukocytes,Ua TRACE (*)    All other components within normal  limits  URINE CULTURE  NOVEL CORONAVIRUS, NAA (HOSP ORDER, SEND-OUT TO REF LAB; TAT 18-24 HRS)  BRAIN NATRIURETIC PEPTIDE   Hemoglobin  Date Value Ref Range Status  06/03/2019 10.7 (L) 12.0 - 15.0 g/dL Final  03/24/2019 10.1 (L) 11.7 - 15.5 g/dL Final  01/31/2019 10.9 (L) 12.0 - 15.0 g/dL Final  01/30/2019 9.9 (L) 12.0 - 15.0 g/dL Final    EKG EKG Interpretation  Date/Time:  Tuesday June 03 2019 17:50:42 EST Ventricular Rate:  60 PR Interval:    QRS Duration: 162 QT Interval:  520 QTC Calculation: 520 R Axis:   -91 Text Interpretation: AV dual-paced rhythm Abnormal ECG No significant change since last tracing Confirmed by Wandra Arthurs [phone]) on 06/04/2019 7:50:52 AM   Radiology DG Chest 2 View  Result Date: 06/03/2019 CLINICAL DATA:  Shortness of breath for 1 day. Lower extremity swelling. COPD. Chronic kidney disease. EXAM: CHEST - 2 VIEW COMPARISON:  01/30/2019 FINDINGS: Heart size is within normal limits. Dual lead pacemaker remains in appropriate position. Insert AO both lungs are well aerated and clear. No evidence of pneumothorax or pleural effusion. IMPRESSION: No active cardiopulmonary disease. Electronically Signed   By: Marlaine Hind M.D.   On: 06/03/2019 18:58   DG Humerus Right  Result Date: 06/04/2019 CLINICAL DATA:  83 86-year-old female with fall and right upper extremity pain. EXAM: RIGHT HUMERUS - 2+ VIEW COMPARISON:  None. FINDINGS: There is no acute fracture or dislocation. The bones are osteopenic. Mild degenerative changes of the right shoulder. The soft tissues are unremarkable. IMPRESSION: No acute fracture or dislocation. Electronically Signed   By: Anner Crete M.D.   On: 06/04/2019 09:21    Procedures Procedures (including critical care time)  Medications Ordered in ED Medications  furosemide (LASIX) tablet 40 mg (40 mg Oral Given 06/04/19 9924)    ED Course  I have reviewed the triage vital signs and the nursing notes.  Pertinent  labs & imaging results that were available during my care of the patient were reviewed by me and considered in my medical decision making (see chart for details).    MDM Rules/Calculators/A&P                      Patient presents with concern for bilateral ankle edema. Patient is nontoxic appearing, afebrile, not tachycardic, not tachypneic, not hypotensive, maintains excellent SPO2 on room air, and is in no apparent distress.  She admits to noncompliance with her Lasix as well as increase in sodium in her diet.  BNP is normal.  Lung sounds clear.  Chest x-ray without acute abnormality.  No orthopnea. There was some mention of concern by the PCPs office for possible COVID-19 infection.  For this reason, coronavirus test was ordered, but patient refused. We will  have the patient take a double dose of her Lasix for the next 3 days.  She will then follow-up with her PCP. No acute abnormalities on x-ray of the right humerus.  The patient was given instructions for home care as well as return precautions. Patient voices understanding of these instructions, accepts the plan, and is comfortable with discharge.  Findings and plan of care discussed with Shirlyn Goltz, MD. Dr. Darl Householder personally evaluated and examined this patient.  Vitals:   06/04/19 0029 06/04/19 0347 06/04/19 0841 06/04/19 0842  BP: (!) 157/75 (!) 154/63 135/61 135/61  Pulse: 60 60 (!) 59 61  Resp: 19 15  17   Temp: 98.2 F (36.8 C) 98.2 F (36.8 C)  98.5 F (36.9 C)  TempSrc:  Oral  Oral  SpO2: 99% 99% 98% 100%    Final Clinical Impression(s) / ED Diagnoses Final diagnoses:  Ankle edema  Injury of right upper arm, initial encounter    Rx / DC Orders ED Discharge Orders         Ordered    furosemide (LASIX) 40 MG tablet  Daily     06/04/19 0931    furosemide (LASIX) 40 MG tablet  Daily,   Status:  Discontinued     06/04/19 [place], Thierno Hun C, PA-C 06/04/19 0942    Drenda Freeze, MD 06/05/19 [phone]

## 2019-06-04 NOTE — ED Notes (Signed)
Patient transported to X-ray 

## 2019-06-05 LAB — URINE CULTURE: Culture: 10000 — AB

## 2019-06-09 ENCOUNTER — Other Ambulatory Visit: Payer: Self-pay

## 2019-06-09 ENCOUNTER — Ambulatory Visit (INDEPENDENT_AMBULATORY_CARE_PROVIDER_SITE_OTHER): Payer: Medicare HMO | Admitting: Family Medicine

## 2019-06-09 DIAGNOSIS — R609 Edema, unspecified: Secondary | ICD-10-CM

## 2019-06-09 DIAGNOSIS — E440 Moderate protein-calorie malnutrition: Secondary | ICD-10-CM

## 2019-06-09 DIAGNOSIS — S20211D Contusion of right front wall of thorax, subsequent encounter: Secondary | ICD-10-CM | POA: Diagnosis not present

## 2019-06-09 DIAGNOSIS — I10 Essential (primary) hypertension: Secondary | ICD-10-CM | POA: Diagnosis not present

## 2019-06-09 NOTE — Progress Notes (Signed)
Virtual Visit via Telephone Note  I connected with Charlotte Henry on 06/10/19 at 12:46pm by telephone and verified that I am speaking with the correct person using two identifiers.      Pt location: at home   Physician location:  In office, [place], [name] MD     On call: patient and physician , pt daughter Charlotte Henry  I discussed the limitations, risks, security and privacy concerns of performing an evaluation and management service by telephone and the availability of in person appointments. I also discussed with the patient that there may be a patient responsible charge related to this service. The patient expressed understanding and agreed to proceed.   History of Present Illness:  ER follow up and from last visit    Right arm has improved  Since the fall- still has abrasions no sign of infection , but still has soreness in right ribs Reviewed xary with her   No fever, no cough since Monday, declined covid test in ER  Peripheral edema- she does not have a scale at home, advised to take lasix 40mg  for 3 days from ER,   Right leg is still has some swelling, no pitting edema per daughter- I described how to check for this She is now taking 20mg  of lasix  Reviewed ER note and labs/imaging with patient and daughter  HTN- BP has been good  Protein malnutrition- she is still not eating well, drinking ensure and 1 small meal a day    Observations/Objective:  She does not have a scale   120/62 HR 61   unable to evaluate   Assessment and Plan: Bruised ribs s/p fall- no fracture and humerus xray also negative , can use lidoderm patches at home to ribs   Peripheral edema- increase lasix  Back to 40mg  x 2 days, then resume 20mg , script given for Compression hose  15-77mmg once a day   HTN- BP controlled Cough/fever resolved  Protein malnutrition unchanged continue to encourage at least 2 meals a day plus ensure    Follow Up Instructions:    I  discussed the assessment and treatment plan with the patient. The patient was provided an opportunity to ask questions and all were answered. The patient agreed with the plan and demonstrated an understanding of the instructions.   The patient was advised to call back or seek an in-person evaluation if the symptoms worsen or if the condition fails to improve as anticipated.  I provided 20 minutes of non-face-to-face time during this encounter. End Time: 1:06pm  [name], MD

## 2019-06-10 ENCOUNTER — Encounter: Payer: Self-pay | Admitting: Family Medicine

## 2019-06-20 ENCOUNTER — Telehealth: Payer: Self-pay

## 2019-06-20 DIAGNOSIS — R609 Edema, unspecified: Secondary | ICD-10-CM

## 2019-06-20 DIAGNOSIS — M79671 Pain in right foot: Secondary | ICD-10-CM

## 2019-06-20 NOTE — Telephone Encounter (Signed)
Pt called stating that she would like a referral to podiatry ([facility]) for her feet pain. Pt states she has had issues with her feet for a while and she cannot seem to get the swelling to go down. Please advise.

## 2019-06-20 NOTE — Telephone Encounter (Signed)
Order placed

## 2019-07-01 ENCOUNTER — Other Ambulatory Visit: Payer: Self-pay

## 2019-07-01 ENCOUNTER — Ambulatory Visit (INDEPENDENT_AMBULATORY_CARE_PROVIDER_SITE_OTHER): Payer: Medicare HMO | Admitting: Sports Medicine

## 2019-07-01 ENCOUNTER — Encounter: Payer: Self-pay | Admitting: Sports Medicine

## 2019-07-01 ENCOUNTER — Ambulatory Visit: Payer: Medicare HMO

## 2019-07-01 ENCOUNTER — Telehealth: Payer: Self-pay | Admitting: Cardiovascular Disease

## 2019-07-01 VITALS — BP 112/61 | HR 60 | Temp 94.3°F | Resp 16

## 2019-07-01 DIAGNOSIS — M79674 Pain in right toe(s): Secondary | ICD-10-CM | POA: Diagnosis not present

## 2019-07-01 DIAGNOSIS — M79671 Pain in right foot: Secondary | ICD-10-CM

## 2019-07-01 DIAGNOSIS — B351 Tinea unguium: Secondary | ICD-10-CM

## 2019-07-01 DIAGNOSIS — I739 Peripheral vascular disease, unspecified: Secondary | ICD-10-CM

## 2019-07-01 DIAGNOSIS — M79675 Pain in left toe(s): Secondary | ICD-10-CM

## 2019-07-01 NOTE — Telephone Encounter (Signed)

## 2019-07-01 NOTE — Progress Notes (Signed)
Subjective: Charlotte Henry is a 79 y.o. female patient seen today in office with complaint of mildly painful thickened and elongated toenails; unable to trim. Patient admits to history of swelling and circulation problems. Patient has no other pedal complaints at this time.   Review of Systems  All other systems reviewed and are negative.   Patient Active Problem List   Diagnosis Date Noted  . Cognitive change 03/25/2019  . B12 deficiency 03/24/2019  . AAA (abdominal aortic aneurysm) ([place]) 02/17/2019  . Vertigo 01/30/2019  . 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[place]) 01/10/2018  . Poor appetite 12/19/2017  . Itching 07/19/2017  . Gait instability 02/26/2017  . Symptomatic anemia 08/15/2016  . Protein-calorie malnutrition ([place]) 08/04/2016  . Helicobacter pylori gastritis 07/26/2016  . Generalized weakness 07/09/2016  . Normocytic anemia 07/03/2016  . CHB (complete heart block) ([place])   . COPD (chronic obstructive pulmonary disease) ([place])   . Tobacco use disorder 10/15/2015  . Back pain 12/30/2014  . OA (osteoarthritis) 12/30/2014  . Carotid stenosis 03/11/2014  . PVD (peripheral vascular disease) ([place]) 03/11/2014  . Colon cancer screening 02/04/2014  . Overactive bladder 12/31/2013  . Rash and nonspecific skin eruption 05/19/2013  . Numbness and tingling-bilat foot 03/05/2013  . IBS (irritable bowel syndrome) 12/30/2012  . Peripheral neuropathy 07/01/2012  . Insomnia 07/01/2012  . Chronic kidney disease (CKD), stage III (moderate) 08/25/2010  . Cardiovascular disease 08/16/2009  . Cerebrovascular disease 08/16/2009  . Peripheral vascular disease ([place]) 08/16/2009  . Type 2 diabetes mellitus with diabetic neuropathy, unspecified ([place]) 10/26/2008  . Hyperlipidemia 10/26/2008  . Gout, unspecified 10/26/2008  . Essential hypertension 10/26/2008  . Asthmatic bronchitis 10/26/2008  . Gastroesophageal reflux disease 10/26/2008    Current Outpatient Medications on File  Prior to Visit  Medication Sig Dispense Refill  . albuterol (PROVENTIL HFA;VENTOLIN HFA) 108 (90 Base) MCG/ACT inhaler Inhale 2 puffs into the lungs every 4 (four) hours as needed for wheezing or shortness of breath. 1 Inhaler 2  . ALPRAZolam (XANAX) 0.25 MG tablet TAKE 1 TABLET BY MOUTH AT BEDTIME AS NEEDED. 30 tablet 0  . atorvastatin (LIPITOR) 10 MG tablet Take 1 tablet (10 mg total) by mouth daily at 6 PM. (Patient taking differently: Take 10 mg by mouth 3 (three) times a week. ) 30 tablet 3  . calcium citrate-vitamin D (CITRACAL+D) 315-200 MG-UNIT per tablet Take 1 tablet by mouth daily.      . Cyanocobalamin (VITAMIN B 12 PO) Take 1 tablet by mouth daily.     Marland Kitchen donepezil (ARICEPT) 5 MG tablet Take 1 tablet (5 mg total) by mouth at bedtime. 30 tablet 3  . escitalopram (LEXAPRO) 10 MG tablet TAKE 1 TABLET BY MOUTH ONCE A DAY. 30 tablet 0  . esomeprazole (NEXIUM) 40 MG capsule TAKE 1 CAPSULE BY MOUTH DAILY BEFORE BREAKFAST. (Patient taking differently: Take 40 mg by mouth daily at 12 noon. ) 30 capsule 11  . ezetimibe (ZETIA) 10 MG tablet Take 1 tablet (10 mg total) by mouth daily. 90 tablet 2  . ferrous sulfate 325 (65 FE) MG tablet Take 325 mg by mouth daily with breakfast.    . furosemide (LASIX) 20 MG tablet Take 1 tablet (20 mg total) by mouth daily as needed. Leg swelling 30 tablet 0  . gabapentin (NEURONTIN) 100 MG capsule TAKE 1 TO 2 CAPSULES BY MOUTH AT BEDTIME FOR NERVE PAIN. (Patient taking differently: Take 100 mg by mouth at bedtime. ) 180 capsule 2  .  HYDROcodone-acetaminophen (NORCO/VICODIN) 5-325 MG tablet TAKE 1 TABLET BY MOUTH TWICE DAILY AS NEEDED FOR MODERATE PAIN. 30 tablet 0  . hydrOXYzine (ATARAX/VISTARIL) 25 MG tablet TAKE (1) TABLET BY MOUTH EVERY (8) HOURS AS NEEDED FOR ITCHING. 45 tablet 0  . lidocaine (LIDODERM) 5 % Place 1 patch onto the skin daily. Remove & Discard patch within 12 hours or as directed by MD 30 patch 0  . meclizine (ANTIVERT) 25 MG tablet Take 1  tablet (25 mg total) by mouth every 6 (six) hours as needed for dizziness. 30 tablet 0  . Multiple Vitamin (MULTIVITAMIN WITH MINERALS) TABS tablet Take 1 tablet by mouth daily.    . ondansetron (ZOFRAN ODT) 4 MG disintegrating tablet Take 1 tablet (4 mg total) by mouth every 8 (eight) hours as needed for nausea or vomiting. 20 tablet 0  . furosemide (LASIX) 40 MG tablet Take 1 tablet (40 mg total) by mouth daily for 3 days. 3 tablet 0   No current facility-administered medications on file prior to visit.    Allergies  Allergen Reactions  . Ace Inhibitors Hives, Shortness Of Breath and Swelling  . Aspirin Other (See Comments)    Blood in stool and nose bleed  . Codeine Swelling  . Pineapple Shortness Of Breath and Swelling  . Plasticized Base [Plastibase] Hives and Swelling    No plastics  . Ultram [Tramadol] Swelling  . Adhesive [Tape] Swelling and Rash  . Naprosyn [Naproxen] Swelling  . Biaxin [Clarithromycin] Nausea And Vomiting and Other (See Comments)    Dizziness.  . Clindamycin/Lincomycin Other (See Comments)    Burning sensation throat, abdomen  . Linzess [Linaclotide]     DIARRHEA, FECAL INCONTINENCE  . Metronidazole     NAUSEA AND WEAKNESS  . Nsaids   . Penicillins     Lip swelling, Hives Has patient had a PCN reaction causing immediate rash, facial/tongue/throat swelling, SOB or lightheadedness with hypotension:YES Has patient had a PCN reaction causing severe rash involving mucus membranes or skin necrosis: NO Has patient had a PCN reaction that required hospitalization NO Has patient had a PCN reaction occurring within the last 10 years: NO If all of the above answers are "NO", then may proceed with Cephalosporin use.  . Varenicline Tartrate Swelling  . Xarelto [Rivaroxaban]     Bleeding    Objective: Physical Exam  General: Well developed, nourished, no acute distress, awake, alert and oriented x 3  Vascular: Dorsalis pedis artery 0/4 bilateral, Posterior  tibial artery 0/4 bilateral, skin temperature warm to cool proximal to distal bilateral lower extremities, + varicosities, no pedal hair present bilateral.  Neurological: Gross sensation present via light touch bilateral.   Dermatological: Skin is warm, dry, and supple bilateral, Nails 1-10 are tender, long, thick, and discolored with mild subungal debris, no webspace macerations present bilateral, no open lesions present bilateral, no callus/corns/hyperkeratotic tissue present bilateral. No signs of infection bilateral.  Musculoskeletal: Asymptomatic pes planus and hammertoe boney deformities noted bilateral. Muscular strength within normal limits without painon range of motion. No pain with calf compression bilateral.  Assessment and Plan:  Problem List Items Addressed This Visit      Cardiovascular and Mediastinum   PVD (peripheral vascular disease) ([place])    Other Visit Diagnoses    Pain due to onychomycosis of toenails of both feet    -  Primary     -Examined patient.  -Discussed treatment options for painful mycotic nails. -Mechanically debrided and reduced mycotic nails with sterile nail nipper  and dremel nail file without incident. -Patient to return in 3 months for follow up evaluation or sooner if symptoms worsen.  Landis Martins, DPM

## 2019-07-02 ENCOUNTER — Encounter: Payer: Medicare HMO | Admitting: Vascular Surgery

## 2019-07-04 ENCOUNTER — Telehealth (INDEPENDENT_AMBULATORY_CARE_PROVIDER_SITE_OTHER): Payer: Medicare HMO | Admitting: Cardiovascular Disease

## 2019-07-04 ENCOUNTER — Encounter: Payer: Self-pay | Admitting: Cardiovascular Disease

## 2019-07-04 VITALS — BP 110/68 | HR 62 | Ht 67.0 in | Wt 144.0 lb

## 2019-07-04 DIAGNOSIS — I442 Atrioventricular block, complete: Secondary | ICD-10-CM

## 2019-07-04 DIAGNOSIS — Z950 Presence of cardiac pacemaker: Secondary | ICD-10-CM

## 2019-07-04 DIAGNOSIS — I480 Paroxysmal atrial fibrillation: Secondary | ICD-10-CM

## 2019-07-04 DIAGNOSIS — Z720 Tobacco use: Secondary | ICD-10-CM

## 2019-07-04 DIAGNOSIS — I6523 Occlusion and stenosis of bilateral carotid arteries: Secondary | ICD-10-CM

## 2019-07-04 DIAGNOSIS — Z955 Presence of coronary angioplasty implant and graft: Secondary | ICD-10-CM

## 2019-07-04 DIAGNOSIS — I25118 Atherosclerotic heart disease of native coronary artery with other forms of angina pectoris: Secondary | ICD-10-CM | POA: Diagnosis not present

## 2019-07-04 DIAGNOSIS — E785 Hyperlipidemia, unspecified: Secondary | ICD-10-CM

## 2019-07-04 DIAGNOSIS — I739 Peripheral vascular disease, unspecified: Secondary | ICD-10-CM

## 2019-07-04 DIAGNOSIS — I10 Essential (primary) hypertension: Secondary | ICD-10-CM

## 2019-07-04 NOTE — Patient Instructions (Signed)
Medication Instructions:  Your physician recommends that you continue on your current medications as directed. Please refer to the Current Medication list given to you today.   Labwork: none  Testing/Procedures: Your physician has requested that you have a carotid duplex. This test is an ultrasound of the carotid arteries in your neck. It looks at blood flow through these arteries that supply the brain with blood. Allow one hour for this exam. There are no restrictions or special instructions.    Follow-Up: Your physician wants you to follow-up in: 6 months. You will receive a reminder letter in the mail two months in advance. If you don't receive a letter, please call our office to schedule the follow-up appointment.    Any Other Special Instructions Will Be Listed Below (If Applicable).  You have been referred to [facility]     If you need a refill on your cardiac medications before your next appointment, please call your pharmacy.

## 2019-07-04 NOTE — Progress Notes (Signed)
Virtual Visit via Telephone Note   This visit type was conducted due to national recommendations for restrictions regarding the COVID-19 Pandemic (e.g. social distancing) in an effort to limit this patient's exposure and mitigate transmission in our community.  Due to her co-morbid illnesses, this patient is at least at moderate risk for complications without adequate follow up.  This format is felt to be most appropriate for this patient at this time.  The patient did not have access to video technology/had technical difficulties with video requiring transitioning to audio format only (telephone).  All issues noted in this document were discussed and addressed.  No physical exam could be performed with this format.  Please refer to the patient's chart for her  consent to telehealth for [facility]   Date:  07/04/2019   ID:  Charlotte Henry, DOB August 30, 1940, MRN [id number]  Patient Location: Home Provider Location: Office  PCP:  Alycia Rossetti, MD  Cardiologist:  Kate Sable, MD  Electrophysiologist:  Cristopher Peru, MD   Evaluation Performed:  Follow-Up Visit  Chief Complaint:  CAD  History of Present Illness:    Charlotte Henry is a 79 y.o. female with CAD, CHB s/p PPM, PAF, tobacco abuse, and PAD.  She was evaluated in the ED at the end of December 2020 for bilateral ankle edema.  She was given a prescription for Lasix.  BNP was normal.  She denies chest pain, palpitations, and shortness of breath.  She has pain in both of her ankles.  She wears support hose for ankle swelling bilaterally.  Podiatry note from 07/01/2019 indicates 0 out of 4 bilateral posterior tibial and dorsalis pedis pulses.  Skin temperature was documented as cool in both extremities.    Past Medical History:  Diagnosis Date  . 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 ASCVD (arteriosclerotic cardiovascular disease)    a. cath in 4/04,-80%LAD; 70% RCA, -> DESx2; residual 60% distal RCA; nl EF   b. Nuc 02/2016 aborted due to bradycardia. Will need to be rescheduled   . Carotid artery occlusion   . Cerebrovascular disease   . CHB (complete heart block) ([place])    a. s/p PPM on 03/24/16 with a St. Jude (serial number [id number]) pacemaker  . Chronic kidney disease (CKD), stage III (moderate)   . Clostridium difficile colitis 03/2013   a. 03/2013  . COPD (chronic obstructive pulmonary disease) ([place])   . Coronary disease   . Diverticulosis   . GERD (gastroesophageal reflux disease)   . Gout   . H pylori ulcer   . Hyperlipidemia    chose to stop statin, takes Zetia  . Hypertension    not currently on medication  . IBS (irritable bowel syndrome)   . PVD (peripheral vascular disease) ([place])    a. moderate to severely decreased ABI's in 8/08, sig. aortic inflow disease b. repeat ABI's in 2011 improved- mild disease on the left and moderate to severe disease on the right  . S/P placement of cardiac pacemaker    a. 03/24/16: St. Jude (serial number [id number]) pacemaker  . Symptomatic anemia 08/15/2016  . TIA (transient ischemic attack) 2012  . Tobacco abuse    a. 50 pack years continuing at 1/2 pack per day  . Tobacco use disorder 10/15/2015   Past Surgical History:  Procedure Laterality Date  . ABDOMINAL HYSTERECTOMY    . carpel tunnel release     bilateral  . CHOLECYSTECTOMY    . COLONOSCOPY  2009  .  COLONOSCOPY N/A 08/04/2016   incomplete due to prep.   . COLONOSCOPY N/A 08/05/2016   Dr. Oneida Alar: moderately redundant rectosigmoid and sigmoid, normal TI, single large-mouthed diverticulum in hepatic flexure, external hemorrhoids, path negative for microscopic colitis   . ENTEROSCOPY N/A 08/18/2016   Procedure: ENTEROSCOPY;  Surgeon: Daneil Dolin, MD;  [facility];  Service: Endoscopy;  Laterality: N/A;  Pediatric colonoscope  . EP IMPLANTABLE DEVICE N/A 03/24/2016   Procedure: Pacemaker Implant;  Surgeon: [name], MD;  Location: [facility];  Service:  Cardiovascular;  Laterality: N/A;  . ESOPHAGOGASTRODUODENOSCOPY N/A 08/04/2016   Dr. Oneida Alar: non-critical Schatzki's ring, small hiatal hernia, +H.pylori gastritis on path.   Marland Kitchen GIVENS CAPSULE STUDY N/A 08/16/2016   Procedure: GIVENS CAPSULE STUDY;  Surgeon: Daneil Dolin, MD;  Location: [facility];  Service: Endoscopy;  Laterality: N/A;  . TOTAL ABDOMINAL HYSTERECTOMY W/ BILATERAL SALPINGOOPHORECTOMY  1979     No outpatient medications have been marked as taking for the 07/04/19 encounter (Telemedicine) with Herminio Commons, MD.     Allergies:   Ace inhibitors, Aspirin, Codeine, Pineapple, Plasticized base [plastibase], Ultram [tramadol], Adhesive [tape], Naprosyn [naproxen], Biaxin [clarithromycin], Clindamycin/lincomycin, Linzess [linaclotide], Metronidazole, Nsaids, Penicillins, Varenicline tartrate, and Xarelto [rivaroxaban]   Social History   Tobacco Use  . Smoking status: Current Every Day Smoker    Packs/day: 1.00    Years: 50.00    Pack years: 50.00    Types: Cigarettes    Start date: 02/02/1966  . Smokeless tobacco: Never Used  . Tobacco comment: 1/2 pack daily  Substance Use Topics  . Alcohol use: No    Alcohol/week: 0.0 standard drinks    Comment: H/O of 6 pack per day quiting in 2003  . Drug use: No     Family Hx: The patient's family history includes Alcohol abuse in her brother; Aneurysm (age of onset: 56) in her father; Cancer in her sister; Cerebral aneurysm in her father; Diabetes type II in her brother; Hypertension in her mother; Liver disease in her brother, brother, and brother; Liver disease (age of onset: 63) in her mother. There is no history of Colon cancer.  ROS:   Please see the history of present illness.     All other systems reviewed and are negative.   Prior CV studies:   The following studies were reviewed today:  Echo 01/31/19:  1. Stage 1: stage1: Apex is abnormal. 2. The left ventricle has normal systolic function, with an  ejection fraction of 55-60%. The cavity size was normal. Moderate basal septal hypertrophy. Otherwise, mild concentric LVH of remaining myocardium. Left ventricular diastolic Doppler parameters are consistent with impaired relaxation. 3. The right ventricle has normal systolic function. The cavity was normal. There is no increase in right ventricular wall thickness. 4. Left atrial size was mildly dilated. 5. The mitral valve is grossly normal. 6. The tricuspid valve is grossly normal. 7. The aortic valve is tricuspid. Mild thickening of the aortic valve. Mild calcification of the aortic valve. 8. The aorta is normal unless otherwise noted.  Labs/Other Tests and Data Reviewed:    EKG:  No ECG reviewed.  Recent Labs: 01/30/2019: TSH 1.518 06/03/2019: ALT 12; B Natriuretic Peptide 71.6; BUN 23; Creatinine, Ser 1.11; Hemoglobin 10.7; Platelets 215; Potassium 4.3; Sodium 142   Recent Lipid Panel Lab Results  Component Value Date/Time   CHOL 168 06/28/2018 01:02 PM   TRIG 112 06/28/2018 01:02 PM   TRIG 112 08/11/2008 12:00 AM  HDL 47 (L) 06/28/2018 01:02 PM   CHOLHDL 3.6 06/28/2018 01:02 PM   LDLCALC 100 (H) 06/28/2018 01:02 PM    Wt Readings from Last 3 Encounters:  07/04/19 144 lb (65.3 kg)  06/03/19 144 lb 8 oz (65.5 kg)  03/24/19 143 lb (64.9 kg)     Objective:    Vital Signs:  BP 110/68   Pulse 62   Ht 5\' 7"  (1.702 m)   Wt 144 lb (65.3 kg)   BMI 22.55 kg/m    VITAL SIGNS:  reviewed  ASSESSMENT & PLAN:    1. CAD: She underwent drug-eluting stent placement to the LAD and RCA in 2004. Symptomatically stable.Not on ASA (allergic). Maintained on atorvastatin and Zetia.   2. Essential HTN: BP normal. No changes.  3. Hyperlipidemia:Maintained on atorvastatin and Zetia.   4. CHB s/p PPM: Medtronic dual-chamber pacemaker. Stablewith normal device function. Routine monitoring in device clinic.  5. Peripheral vascular disease: Previously followed by vascular  surgery. Takes a statin. Tobacco cessation counseling previouslygiven.Continues to smoke. Due to claudication pain, I previously made a referral back to vascular surgery but she has not seen them yet. She will likely need follow up imaging.  6. Tobacco abuse disorder: Tobacco cessation counselingpreviouslygiven.Continues to smoke.  7. PAF: Not an anticoagulation candidate.  8. Carotid artery stenosis: 50-69% left ICA stenosis on 05/27/18 by Dopplers. I will repeat now.     COVID-19 Education: The signs and symptoms of COVID-19 were discussed with the patient and how to seek care for testing (follow up with PCP or arrange E-visit).  The importance of social distancing was discussed today.  Time:   Today, I have spent 15 minutes with the patient with telehealth technology discussing the above problems.     Medication Adjustments/Labs and Tests Ordered: Current medicines are reviewed at length with the patient today.  Concerns regarding medicines are outlined above.   Tests Ordered: No orders of the defined types were placed in this encounter.   Medication Changes: No orders of the defined types were placed in this encounter.   Follow Up:  Virtual Visit  in 6 month(s)  Signed, Kate Sable, MD  07/04/2019 1:30 PM    [place]

## 2019-07-04 NOTE — Addendum Note (Signed)
Addended by: Debbora Lacrosse R on: 07/04/2019 02:35 PM   Modules accepted: Orders

## 2019-07-14 ENCOUNTER — Ambulatory Visit (INDEPENDENT_AMBULATORY_CARE_PROVIDER_SITE_OTHER): Payer: Medicare HMO | Admitting: *Deleted

## 2019-07-14 DIAGNOSIS — I442 Atrioventricular block, complete: Secondary | ICD-10-CM | POA: Diagnosis not present

## 2019-07-14 LAB — CUP PACEART REMOTE DEVICE CHECK
Battery Remaining Longevity: 101 mo
Battery Remaining Percentage: 95.5 %
Battery Voltage: 2.96 V
Brady Statistic AP VP Percent: 68 %
Brady Statistic AP VS Percent: 1 %
Brady Statistic AS VP Percent: 32 %
Brady Statistic AS VS Percent: 1 %
Brady Statistic RA Percent Paced: 67 %
Brady Statistic RV Percent Paced: 99 %
Date Time Interrogation Session: 20210208020029
Implantable Lead Implant Date: 20171020
Implantable Lead Implant Date: 20171020
Implantable Lead Location: 753859
Implantable Lead Location: 753860
Implantable Pulse Generator Implant Date: 20171020
Lead Channel Impedance Value: 390 Ohm
Lead Channel Impedance Value: 430 Ohm
Lead Channel Pacing Threshold Amplitude: 0.5 V
Lead Channel Pacing Threshold Amplitude: 0.75 V
Lead Channel Pacing Threshold Pulse Width: 0.4 ms
Lead Channel Pacing Threshold Pulse Width: 0.4 ms
Lead Channel Sensing Intrinsic Amplitude: 2.2 mV
Lead Channel Sensing Intrinsic Amplitude: 9.3 mV
Lead Channel Setting Pacing Amplitude: 2 V
Lead Channel Setting Pacing Amplitude: 2.5 V
Lead Channel Setting Pacing Pulse Width: 0.4 ms
Lead Channel Setting Sensing Sensitivity: 3.5 mV
Pulse Gen Model: 2272
Pulse Gen Serial Number: 3180497

## 2019-07-15 NOTE — Progress Notes (Signed)
PPM Remote  

## 2019-07-25 ENCOUNTER — Ambulatory Visit (HOSPITAL_COMMUNITY): Payer: Medicare HMO

## 2019-07-25 ENCOUNTER — Encounter: Payer: Self-pay | Admitting: Internal Medicine

## 2019-07-25 ENCOUNTER — Telehealth (INDEPENDENT_AMBULATORY_CARE_PROVIDER_SITE_OTHER): Payer: Medicare HMO | Admitting: Internal Medicine

## 2019-07-25 ENCOUNTER — Telehealth: Payer: Self-pay | Admitting: Internal Medicine

## 2019-07-25 VITALS — BP 120/62 | HR 61 | Ht 67.0 in | Wt 144.0 lb

## 2019-07-25 DIAGNOSIS — I442 Atrioventricular block, complete: Secondary | ICD-10-CM | POA: Diagnosis not present

## 2019-07-25 DIAGNOSIS — Z950 Presence of cardiac pacemaker: Secondary | ICD-10-CM | POA: Diagnosis not present

## 2019-07-25 MED ORDER — FUROSEMIDE 40 MG PO TABS: 40.0000 mg | ORAL_TABLET | Freq: Every day | ORAL | 11 refills | Status: DC

## 2019-07-25 NOTE — Patient Instructions (Signed)
Medication Instructions:  Your physician has recommended you make the following change in your medication:   Increase Lasix to 40 mg Daily    Labwork: NONE  Testing/Procedures: NONE   Follow-Up: Your physician recommends that you schedule a follow-up appointment in: August with Dr. Lovena Le    Any Other Special Instructions Will Be Listed Below (If Applicable).     If you need a refill on your cardiac medications before your next appointment, please call your pharmacy. Thank you for choosing [facility]!

## 2019-07-25 NOTE — Progress Notes (Signed)
Electrophysiology TeleHealth Note   Due to national recommendations of social distancing due to COVID 19, an audio/video telehealth visit is felt to be most appropriate for this patient at this time.  See MyChart message from today for the patient's consent to telehealth for [facility].   Date:  07/25/2019   ID:  Charlotte Henry, Charlotte Henry 06-14-1940, MRN [id number]  Location: patient's home  Provider location: [address] [place]  Evaluation Performed: Follow-up visit  PCP:  Alycia Rossetti, MD  Cardiologist:  Kate Sable, MD  Electrophysiologist:  Dr Lovena Le  Chief Complaint:  "My ankles have been swelling."  History of Present Illness:    Charlotte Henry is a 79 y.o. female who presents via audio/video conferencing for a telehealth visit today. 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 She admits to dietary indiscretion and has run out of her lasix. She has been vaccinated once from [place].  The patient denies symptoms of fevers, chills, cough, or new SOB worrisome for COVID 19.  Past Medical History:  Diagnosis Date  . Arterial fibromuscular dysplasia ([place])   . ASCVD (arteriosclerotic cardiovascular disease)    a. cath in 4/04,-80%LAD; 70% RCA, -> DESx2; residual 60% distal RCA; nl EF  b. Nuc 02/2016 aborted due to bradycardia. Will need to be rescheduled   . Carotid artery occlusion   . Cerebrovascular disease   . CHB (complete heart block) ([place])    a. s/p PPM on 03/24/16 with a St. Jude (serial number [id number]) pacemaker  . Chronic kidney disease (CKD), stage III (moderate)   . Clostridium difficile colitis 03/2013   a. 03/2013  . COPD (chronic obstructive pulmonary disease) ([place])   . Coronary disease   . Diverticulosis   . GERD (gastroesophageal reflux disease)   . Gout   . H pylori ulcer   . Hyperlipidemia    chose to stop statin, takes Zetia  .  Hypertension    not currently on medication  . IBS (irritable bowel syndrome)   . PVD (peripheral vascular disease) ([place])    a. moderate to severely decreased ABI's in 8/08, sig. aortic inflow disease b. repeat ABI's in 2011 improved- mild disease on the left and moderate to severe disease on the right  . S/P placement of cardiac pacemaker    a. 03/24/16: St. Jude (serial number [id number]) pacemaker  . Symptomatic anemia 08/15/2016  . TIA (transient ischemic attack) 2012  . Tobacco abuse    a. 50 pack years continuing at 1/2 pack per day  . Tobacco use disorder 10/15/2015    Past Surgical History:  Procedure Laterality Date  . ABDOMINAL HYSTERECTOMY    . carpel tunnel release     bilateral  . CHOLECYSTECTOMY    . COLONOSCOPY  2009  . COLONOSCOPY N/A 08/04/2016   incomplete due to prep.   . COLONOSCOPY N/A 08/05/2016   Dr. Oneida Alar: moderately redundant rectosigmoid and sigmoid, normal TI, single large-mouthed diverticulum in hepatic flexure, external hemorrhoids, path negative for microscopic colitis   . ENTEROSCOPY N/A 08/18/2016   Procedure: ENTEROSCOPY;  Surgeon: Daneil Dolin, MD;  [facility];  Service: Endoscopy;  Laterality: N/A;  Pediatric colonoscope  . EP IMPLANTABLE DEVICE N/A 03/24/2016   Procedure: Pacemaker Implant;  Surgeon: [name], MD;  Location: [place] CV LAB;  Service: Cardiovascular;  Laterality: N/A;  .  ESOPHAGOGASTRODUODENOSCOPY N/A 08/04/2016   Dr. Oneida Alar: non-critical Schatzki's ring, small hiatal hernia, +H.pylori gastritis on path.   Marland Kitchen GIVENS CAPSULE STUDY N/A 08/16/2016   Procedure: GIVENS CAPSULE STUDY;  Surgeon: Daneil Dolin, MD;  Location[facility];  Service: Endoscopy;  Laterality: N/A;  . TOTAL ABDOMINAL HYSTERECTOMY W/ BILATERAL SALPINGOOPHORECTOMY  1979    Current Outpatient Medications  Medication Sig Dispense Refill  . albuterol (PROVENTIL HFA;VENTOLIN HFA) 108 (90 Base) MCG/ACT inhaler Inhale 2 puffs into the lungs every 4  (four) hours as needed for wheezing or shortness of breath. 1 Inhaler 2  . ALPRAZolam (XANAX) 0.25 MG tablet TAKE 1 TABLET BY MOUTH AT BEDTIME AS NEEDED. 30 tablet 0  . atorvastatin (LIPITOR) 10 MG tablet Take 1 tablet (10 mg total) by mouth daily at 6 PM. (Patient taking differently: Take 10 mg by mouth 3 (three) times a week. ) 30 tablet 3  . calcium citrate-vitamin D (CITRACAL+D) 315-200 MG-UNIT per tablet Take 1 tablet by mouth daily.      . Cyanocobalamin (VITAMIN B 12 PO) Take 1 tablet by mouth daily.     Marland Kitchen donepezil (ARICEPT) 5 MG tablet Take 1 tablet (5 mg total) by mouth at bedtime. 30 tablet 3  . escitalopram (LEXAPRO) 10 MG tablet TAKE 1 TABLET BY MOUTH ONCE A DAY. 30 tablet 0  . esomeprazole (NEXIUM) 40 MG capsule TAKE 1 CAPSULE BY MOUTH DAILY BEFORE BREAKFAST. (Patient taking differently: Take 40 mg by mouth daily at 12 noon. ) 30 capsule 11  . ezetimibe (ZETIA) 10 MG tablet Take 1 tablet (10 mg total) by mouth daily. 90 tablet 2  . ferrous sulfate 325 (65 FE) MG tablet Take 325 mg by mouth daily with breakfast.    . furosemide (LASIX) 20 MG tablet Take 1 tablet (20 mg total) by mouth daily as needed. Leg swelling 30 tablet 0  . gabapentin (NEURONTIN) 100 MG capsule TAKE 1 TO 2 CAPSULES BY MOUTH AT BEDTIME FOR NERVE PAIN. (Patient taking differently: Take 100 mg by mouth at bedtime. ) 180 capsule 2  . HYDROcodone-acetaminophen (NORCO/VICODIN) 5-325 MG tablet TAKE 1 TABLET BY MOUTH TWICE DAILY AS NEEDED FOR MODERATE PAIN. 30 tablet 0  . hydrOXYzine (ATARAX/VISTARIL) 25 MG tablet TAKE (1) TABLET BY MOUTH EVERY (8) HOURS AS NEEDED FOR ITCHING. 45 tablet 0  . lidocaine (LIDODERM) 5 % Place 1 patch onto the skin daily. Remove & Discard patch within 12 hours or as directed by MD 30 patch 0  . meclizine (ANTIVERT) 25 MG tablet Take 1 tablet (25 mg total) by mouth every 6 (six) hours as needed for dizziness. 30 tablet 0  . Multiple Vitamin (MULTIVITAMIN WITH MINERALS) TABS tablet Take 1 tablet  by mouth daily.    . ondansetron (ZOFRAN ODT) 4 MG disintegrating tablet Take 1 tablet (4 mg total) by mouth every 8 (eight) hours as needed for nausea or vomiting. 20 tablet 0   No current facility-administered medications for this visit.    Allergies:   Ace inhibitors, Aspirin, Codeine, Pineapple, Plasticized base [plastibase], Ultram [tramadol], Adhesive [tape], Naprosyn [naproxen], Biaxin [clarithromycin], Clindamycin/lincomycin, Linzess [linaclotide], Metronidazole, Nsaids, Penicillins, Varenicline tartrate, and Xarelto [rivaroxaban]   Social History:  The patient  reports that she has been smoking cigarettes. She started smoking about 53 years ago. She has a 50.00 pack-year smoking history. She has never used smokeless tobacco. She reports that she does not drink alcohol or use drugs.   Family History:  The patient's family history includes  Alcohol abuse in her brother; Aneurysm (age of onset: 32) in her father; Cancer in her sister; Cerebral aneurysm in her father; Diabetes type II in her brother; Hypertension in her mother; Liver disease in her brother, brother, and brother; Liver disease (age of onset: 19) in her mother.   ROS:  Please see the history of present illness.   All other systems are personally reviewed and negative.    Exam:    Vital Signs:  BP 120/62   Pulse 61   Ht 5\' 7"  (1.702 m)   Wt 144 lb (65.3 kg)   BMI 22.55 kg/m   Well appearing, alert and conversant, regular work of breathing,  good skin color Eyes- anicteric, neuro- grossly intact, skin- no apparent rash or lesions or cyanosis, mouth- oral mucosa is pink   Labs/Other Tests and Data Reviewed:    Recent Labs: 01/30/2019: TSH 1.518 06/03/2019: ALT 12; B Natriuretic Peptide 71.6; BUN 23; Creatinine, Ser 1.11; Hemoglobin 10.7; Platelets 215; Potassium 4.3; Sodium 142   Wt Readings from Last 3 Encounters:  07/25/19 144 lb (65.3 kg)  07/04/19 144 lb (65.3 kg)  06/03/19 144 lb 8 oz (65.5 kg)     Other  studies personally reviewed: Last device remote is reviewed from [facility] PDF dated 07/14/19 which reveals normal device function, no arrhythmias    ASSESSMENT & PLAN:    1.  CHB - she is asymptomatic, s/p PPM insertion. 2. Diastolic heart failure - she has peripheral edema but does not have sob. She will have her lasix re-prescribed at 40 mg daily 3. HTN - her bp is well controlled. 4. Tobacco abuse - she is down to smoking 4 cigarettes a day.   5. COVID 19 screen The patient denies symptoms of COVID 19 at this time.  The importance of social distancing was discussed today.She has had the first vaccine dose  Follow-up:  8/21 Next remote: 5/21  Current medicines are reviewed at length with the patient today.   The patient does not have concerns regarding her medicines.  The following changes were made today:  none  Labs/ tests ordered today include: none No orders of the defined types were placed in this encounter.    Patient Risk:  after full review of this patients clinical status, I feel that they are at moderate risk at this time.  Today, I have spent 15 minutes with the patient with telehealth technology discussing all of the above .    Signed, Cristopher Peru, MD  07/25/2019 10:04 AM     [facility] [place] [address] [phone] (office) [phone] (fax)

## 2019-07-25 NOTE — Telephone Encounter (Signed)
Calling Charlotte Henry back with BP 120/60 and HR 61

## 2019-07-25 NOTE — Telephone Encounter (Signed)
BP noted in chart

## 2019-07-26 ENCOUNTER — Other Ambulatory Visit: Payer: Self-pay | Admitting: Gastroenterology

## 2019-07-26 ENCOUNTER — Other Ambulatory Visit: Payer: Self-pay | Admitting: Family Medicine

## 2019-08-01 ENCOUNTER — Other Ambulatory Visit: Payer: Self-pay

## 2019-08-01 ENCOUNTER — Ambulatory Visit (HOSPITAL_COMMUNITY)
Admission: RE | Admit: 2019-08-01 | Discharge: 2019-08-01 | Disposition: A | Discharge status: Home / Self Care | Payer: Medicare HMO | Source: Ambulatory Visit | Attending: Cardiovascular Disease | Admitting: Cardiovascular Disease

## 2019-08-01 DIAGNOSIS — I6523 Occlusion and stenosis of bilateral carotid arteries: Secondary | ICD-10-CM | POA: Diagnosis not present

## 2019-08-06 ENCOUNTER — Inpatient Hospital Stay (HOSPITAL_COMMUNITY): Payer: Medicare HMO | Attending: Hematology

## 2019-08-06 ENCOUNTER — Other Ambulatory Visit: Payer: Self-pay

## 2019-08-06 DIAGNOSIS — D649 Anemia, unspecified: Secondary | ICD-10-CM | POA: Diagnosis not present

## 2019-08-06 DIAGNOSIS — N189 Chronic kidney disease, unspecified: Secondary | ICD-10-CM | POA: Diagnosis not present

## 2019-08-06 DIAGNOSIS — C3411 Malignant neoplasm of upper lobe, right bronchus or lung: Secondary | ICD-10-CM | POA: Diagnosis not present

## 2019-08-06 DIAGNOSIS — F17210 Nicotine dependence, cigarettes, uncomplicated: Secondary | ICD-10-CM | POA: Diagnosis not present

## 2019-08-06 LAB — CBC WITH DIFFERENTIAL/PLATELET
Abs Immature Granulocytes: 0.01 10*3/uL (ref 0.00–0.07)
Basophils Absolute: 0 10*3/uL (ref 0.0–0.1)
Basophils Relative: 1 %
Eosinophils Absolute: 0.2 10*3/uL (ref 0.0–0.5)
Eosinophils Relative: 3 %
HCT: 32.8 % — ABNORMAL LOW (ref 36.0–46.0)
Hemoglobin: 10.6 g/dL — ABNORMAL LOW (ref 12.0–15.0)
Immature Granulocytes: 0 %
Lymphocytes Relative: 39 %
Lymphs Abs: 2.3 10*3/uL (ref 0.7–4.0)
MCH: 29.8 pg (ref 26.0–34.0)
MCHC: 32.3 g/dL (ref 30.0–36.0)
MCV: 92.1 fL (ref 80.0–100.0)
Monocytes Absolute: 0.5 10*3/uL (ref 0.1–1.0)
Monocytes Relative: 9 %
Neutro Abs: 2.9 10*3/uL (ref 1.7–7.7)
Neutrophils Relative %: 48 %
Platelets: 225 10*3/uL (ref 150–400)
RBC: 3.56 MIL/uL — ABNORMAL LOW (ref 3.87–5.11)
RDW: 13.7 % (ref 11.5–15.5)
WBC: 5.9 10*3/uL (ref 4.0–10.5)
nRBC: 0 % (ref 0.0–0.2)

## 2019-08-06 LAB — IRON AND TIBC
Iron: 44 ug/dL (ref 28–170)
Saturation Ratios: 20 % (ref 10.4–31.8)
TIBC: 220 ug/dL — ABNORMAL LOW (ref 250–450)
UIBC: 176 ug/dL

## 2019-08-06 LAB — VITAMIN B12: Vitamin B-12: 4908 pg/mL — ABNORMAL HIGH (ref 180–914)

## 2019-08-06 LAB — FOLATE: Folate: 14.5 ng/mL (ref >5.9)

## 2019-08-06 LAB — FERRITIN: Ferritin: 311 ng/mL — ABNORMAL HIGH (ref 11–307)

## 2019-08-13 ENCOUNTER — Encounter: Payer: Medicare HMO | Admitting: Vascular Surgery

## 2019-08-13 ENCOUNTER — Ambulatory Visit (HOSPITAL_COMMUNITY): Payer: Medicare HMO | Admitting: Nurse Practitioner

## 2019-08-13 ENCOUNTER — Other Ambulatory Visit: Payer: Self-pay | Admitting: *Deleted

## 2019-08-13 DIAGNOSIS — I714 Abdominal aortic aneurysm, without rupture, unspecified: Secondary | ICD-10-CM

## 2019-08-14 ENCOUNTER — Other Ambulatory Visit: Payer: Self-pay

## 2019-08-14 ENCOUNTER — Inpatient Hospital Stay (HOSPITAL_BASED_OUTPATIENT_CLINIC_OR_DEPARTMENT_OTHER): Payer: Medicare HMO | Admitting: Hematology

## 2019-08-14 VITALS — BP 128/56 | HR 60 | Temp 97.3°F | Resp 18 | Wt 144.0 lb

## 2019-08-14 DIAGNOSIS — C3411 Malignant neoplasm of upper lobe, right bronchus or lung: Secondary | ICD-10-CM | POA: Diagnosis not present

## 2019-08-14 DIAGNOSIS — F17210 Nicotine dependence, cigarettes, uncomplicated: Secondary | ICD-10-CM | POA: Diagnosis not present

## 2019-08-14 DIAGNOSIS — D649 Anemia, unspecified: Secondary | ICD-10-CM

## 2019-08-14 DIAGNOSIS — N189 Chronic kidney disease, unspecified: Secondary | ICD-10-CM | POA: Diagnosis not present

## 2019-08-14 NOTE — Assessment & Plan Note (Addendum)
1.  Normocytic anemia: -Colonoscopy on 08/05/2016 showing 25 polyps less than 10 mm, redundant left colon, diverticulosis of the hepatic flexure, external hemorrhoids. 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 -Last Feraheme infusion on 12/14/2016.  She has mild degree of CKD. -We reviewed labs from 08/06/2019.  Hemoglobin is 10.6.  Z61 and folic acid was normal.  Ferritin is 311 and percent saturation is 20.  She does not require any Feraheme infusion at this time.  2.  Stage I right lung cancer: -She had more than 30-pack-year smoking history.  Screening CT showed right upper lobe lung nodule. -Treated with SBRT, 58 Gray in 3 fractions in August 2019. -Last CT scan reviewed by me on 02/13/2019 showed focal area of architectural distortion in the medial aspect of the right upper lobe, unchanged, potentially postradiation changes. -I will schedule her for a CT scan without contrast for follow-up of lung cancer. -We will schedule her for a phone visit to discuss results.  She will be again scheduled for a 60-month follow-up with CT scan of the lung.

## 2019-08-14 NOTE — Patient Instructions (Signed)
[place] at [facility] Discharge Instructions  You were seen today by Dr. Delton Coombes. He went over your recent lab results. He will repeat a Chest CT and follow up with you by phone. He will see you back in 6 months for labs and follow up.   Thank you for choosing [place] at [facility] to provide your oncology and hematology care.  To afford each patient quality time with our provider, please arrive at least 15 minutes before your scheduled appointment time.   If you have a lab appointment with the [place] please come in thru the  Main Entrance and check in at the main information desk  You need to re-schedule your appointment should you arrive 10 or more minutes late.  We strive to give you quality time with our providers, and arriving late affects you and other patients whose appointments are after yours.  Also, if you no show three or more times for appointments you may be dismissed from the clinic at the providers discretion.     Again, thank you for choosing [facility].  Our hope is that these requests will decrease the amount of time that you wait before being seen by our physicians.       _____________________________________________________________  Should you have questions after your visit to [facility], please contact our office at [phone] between the hours of 8:00 a.m. and 4:30 p.m.  Voicemails left after 4:00 p.m. will not be returned until the following business day.  For prescription refill requests, have your pharmacy contact our office and allow 72 hours.    Cancer Center Support Programs:   > Cancer Support Group  2nd Tuesday of the month 1pm-2pm, Journey Room

## 2019-08-14 NOTE — Progress Notes (Signed)
[place], [place] [address]   CLINIC:  Medical Oncology/Hematology  PCP:  Alycia Rossetti, MD [address] [place] [address] [phone]   REASON FOR VISIT:  Follow-up for iron deficiency anemia and right upper lobe lung cancer.     INTERVAL HISTORY:  Ms. Effertz 79 y.o. female seen for follow-up of iron deficiency anemia and right lung cancer.  Denies any cough or hemoptysis.  She had some diarrhea today in our clinic.  Appetite is 50%.  Energy levels are 25%.  Numbness in the feet has been stable.  Denies any fevers, night sweats or weight loss.  Denies any bleeding per rectum or melena.    REVIEW OF SYSTEMS:  Review of Systems  Cardiovascular: Positive for leg swelling.  Gastrointestinal: Positive for diarrhea.  Neurological: Positive for numbness.     PAST MEDICAL/SURGICAL HISTORY:  Past Medical History:  Diagnosis Date  . 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 ASCVD (arteriosclerotic cardiovascular disease)    a. cath in 4/04,-80%LAD; 70% RCA, -> DESx2; residual 60% distal RCA; nl EF  b. Nuc 02/2016 aborted due to bradycardia. Will need to be rescheduled   . Carotid artery occlusion   . Cerebrovascular disease   . CHB (complete heart block) ([place])    a. s/p PPM on 03/24/16 with a St. Jude (serial number [id number]) pacemaker  . Chronic kidney disease (CKD), stage III (moderate)   . Clostridium difficile colitis 03/2013   a. 03/2013  . COPD (chronic obstructive pulmonary disease) ([place])   . Coronary disease   . Diverticulosis   . GERD (gastroesophageal reflux disease)   . Gout   . H pylori ulcer   . Hyperlipidemia    chose to stop statin, takes Zetia  . Hypertension    not currently on medication  . IBS (irritable bowel syndrome)   . PVD (peripheral vascular disease) ([place])    a. moderate to severely decreased ABI's in 8/08, sig. aortic inflow disease b. repeat ABI's in 2011 improved- mild disease on the left  and moderate to severe disease on the right  . S/P placement of cardiac pacemaker    a. 03/24/16: St. Jude (serial number [id number]) pacemaker  . Symptomatic anemia 08/15/2016  . TIA (transient ischemic attack) 2012  . Tobacco abuse    a. 50 pack years continuing at 1/2 pack per day  . Tobacco use disorder 10/15/2015   Past Surgical History:  Procedure Laterality Date  . ABDOMINAL HYSTERECTOMY    . carpel tunnel release     bilateral  . CHOLECYSTECTOMY    . COLONOSCOPY  2009  . COLONOSCOPY N/A 08/04/2016   incomplete due to prep.   . COLONOSCOPY N/A 08/05/2016   Dr. Oneida Alar: moderately redundant rectosigmoid and sigmoid, normal TI, single large-mouthed diverticulum in hepatic flexure, external hemorrhoids, path negative for microscopic colitis   . ENTEROSCOPY N/A 08/18/2016   Procedure: ENTEROSCOPY;  Surgeon: Daneil Dolin, MD;  Location: [facility];  Service: Endoscopy;  Laterality: N/A;  Pediatric colonoscope  . EP IMPLANTABLE DEVICE N/A 03/24/2016   Procedure: Pacemaker Implant;  Surgeon: [name], MD;  Location: [facility];  Service: Cardiovascular;  Laterality: N/A;  . ESOPHAGOGASTRODUODENOSCOPY N/A 08/04/2016   Dr. Oneida Alar: non-critical Schatzki's ring, small hiatal hernia, +H.pylori gastritis on path.   Marland Kitchen GIVENS CAPSULE STUDY N/A 08/16/2016   Procedure: GIVENS CAPSULE STUDY;  Surgeon: Daneil Dolin, MD;  Location: [facility];  Service: Endoscopy;  Laterality: N/A;  . TOTAL ABDOMINAL HYSTERECTOMY W/ BILATERAL SALPINGOOPHORECTOMY  1979     SOCIAL HISTORY:  Social History   Socioeconomic History  . Marital status: Widowed    Spouse name: Not on file  . Number of children: Not on file  . Years of education: Not on file  . Highest education level: Not on file  Occupational History  . Not on file  Tobacco Use  . Smoking status: Current Every Day Smoker    Packs/day: 1.00    Years: 50.00    Pack years: 50.00    Types: Cigarettes    Start date: 02/02/1966  .  Smokeless tobacco: Never Used  . Tobacco comment: 1/2 pack daily  Substance and Sexual Activity  . Alcohol use: No    Alcohol/week: 0.0 standard drinks    Comment: H/O of 6 pack per day quiting in 2003  . Drug use: No  . Sexual activity: Not Currently  Other Topics Concern  . Not on file  Social History Narrative  . Not on file   Social Determinants of Health   Financial Resource Strain:   . Difficulty of Paying Living Expenses:   Food Insecurity:   . Worried About Charity fundraiser in the Last Year:   . Arboriculturist in the Last Year:   Transportation Needs:   . Film/video editor (Medical):   Marland Kitchen Lack of Transportation (Non-Medical):   Physical Activity:   . Days of Exercise per Week:   . Minutes of Exercise per Session:   Stress:   . Feeling of Stress :   Social Connections:   . Frequency of Communication with Friends and Family:   . Frequency of Social Gatherings with Friends and Family:   . Attends Religious Services:   . Active Member of Clubs or Organizations:   . Attends Archivist Meetings:   Marland Kitchen Marital Status:   Intimate Partner Violence:   . Fear of Current or Ex-Partner:   . Emotionally Abused:   Marland Kitchen Physically Abused:   . Sexually Abused:     FAMILY HISTORY:  Family History  Problem Relation Age of Onset  . Liver disease Mother 27  . Hypertension Mother   . Cerebral aneurysm Father   . Aneurysm Father 29  . Cancer Sister   . Liver disease Brother   . Liver disease Brother   . Liver disease Brother   . Diabetes type II Brother   . Alcohol abuse Brother   . Colon cancer Neg Hx     CURRENT MEDICATIONS:  Outpatient Encounter Medications as of 08/14/2019  Medication Sig  . atorvastatin (LIPITOR) 10 MG tablet Take 1 tablet (10 mg total) by mouth daily at 6 PM. (Patient taking differently: Take 10 mg by mouth 3 (three) times a week. )  . calcium citrate-vitamin D (CITRACAL+D) 315-200 MG-UNIT per tablet Take 1 tablet by mouth daily.    .  Cyanocobalamin (VITAMIN B 12 PO) Take 1 tablet by mouth daily.   Marland Kitchen donepezil (ARICEPT) 5 MG tablet Take 1 tablet (5 mg total) by mouth at bedtime.  Marland Kitchen escitalopram (LEXAPRO) 10 MG tablet TAKE 1 TABLET BY MOUTH ONCE A DAY.  Marland Kitchen esomeprazole (NEXIUM) 40 MG capsule TAKE 1 CAPSULE BY MOUTH DAILY BEFORE BREAKFAST.  Marland Kitchen ezetimibe (ZETIA) 10 MG tablet Take 1 tablet (10 mg total) by mouth daily.  . ferrous sulfate 325 (65 FE) MG tablet Take 325 mg by mouth  daily with breakfast.  . furosemide (LASIX) 40 MG tablet Take 1 tablet (40 mg total) by mouth daily.  Marland Kitchen gabapentin (NEURONTIN) 100 MG capsule TAKE 1 TO 2 CAPSULES BY MOUTH AT BEDTIME FOR NERVE PAIN. (Patient taking differently: Take 100 mg by mouth at bedtime. )  . lidocaine (LIDODERM) 5 % Place 1 patch onto the skin daily. Remove & Discard patch within 12 hours or as directed by MD  . Multiple Vitamin (MULTIVITAMIN WITH MINERALS) TABS tablet Take 1 tablet by mouth daily.  Marland Kitchen albuterol (PROVENTIL HFA;VENTOLIN HFA) 108 (90 Base) MCG/ACT inhaler Inhale 2 puffs into the lungs every 4 (four) hours as needed for wheezing or shortness of breath. (Patient not taking: Reported on 08/14/2019)  . ALPRAZolam (XANAX) 0.25 MG tablet TAKE 1 TABLET BY MOUTH AT BEDTIME AS NEEDED. (Patient not taking: Reported on 08/14/2019)  . HYDROcodone-acetaminophen (NORCO/VICODIN) 5-325 MG tablet TAKE 1 TABLET BY MOUTH TWICE DAILY AS NEEDED FOR MODERATE PAIN. (Patient not taking: Reported on 08/14/2019)  . hydrOXYzine (ATARAX/VISTARIL) 25 MG tablet TAKE (1) TABLET BY MOUTH EVERY (8) HOURS AS NEEDED FOR ITCHING. (Patient not taking: Reported on 08/14/2019)  . meclizine (ANTIVERT) 25 MG tablet Take 1 tablet (25 mg total) by mouth every 6 (six) hours as needed for dizziness. (Patient not taking: Reported on 08/14/2019)  . ondansetron (ZOFRAN ODT) 4 MG disintegrating tablet Take 1 tablet (4 mg total) by mouth every 8 (eight) hours as needed for nausea or vomiting. (Patient not taking: Reported on  08/14/2019)   No facility-administered encounter medications on file as of 08/14/2019.    ALLERGIES:  Allergies  Allergen Reactions  . Ace Inhibitors Hives, Shortness Of Breath and Swelling  . Aspirin Other (See Comments)    Blood in stool and nose bleed  . Codeine Swelling  . Pineapple Shortness Of Breath and Swelling  . Plasticized Base [Plastibase] Hives and Swelling    No plastics  . Ultram [Tramadol] Swelling  . Adhesive [Tape] Swelling and Rash  . Naprosyn [Naproxen] Swelling  . Biaxin [Clarithromycin] Nausea And Vomiting and Other (See Comments)    Dizziness.  . Clindamycin/Lincomycin Other (See Comments)    Burning sensation throat, abdomen  . Linzess [Linaclotide]     DIARRHEA, FECAL INCONTINENCE  . Metronidazole     NAUSEA AND WEAKNESS  . Nsaids   . Penicillins     Lip swelling, Hives Has patient had a PCN reaction causing immediate rash, facial/tongue/throat swelling, SOB or lightheadedness with hypotension:YES Has patient had a PCN reaction causing severe rash involving mucus membranes or skin necrosis: NO Has patient had a PCN reaction that required hospitalization NO Has patient had a PCN reaction occurring within the last 10 years: NO If all of the above answers are "NO", then may proceed with Cephalosporin use.  . Varenicline Tartrate Swelling  . Xarelto [Rivaroxaban]     Bleeding     PHYSICAL EXAM:  ECOG Performance status: 1  Vitals:   08/14/19 1515  BP: (!) 128/56  Pulse: 60  Resp: 18  Temp: (!) 97.3 F (36.3 C)  SpO2: 99%   Filed Weights   08/14/19 1515  Weight: 144 lb (65.3 kg)    Physical Exam Vitals reviewed.  Constitutional:      Appearance: Normal appearance.  Cardiovascular:     Rate and Rhythm: Normal rate and regular rhythm.     Heart sounds: Normal heart sounds.  Pulmonary:     Effort: Pulmonary effort is normal.     Breath sounds:  Normal breath sounds.  Abdominal:     General: There is no distension.     Palpations:  Abdomen is soft. There is no mass.  Musculoskeletal:        General: No swelling.  Skin:    General: Skin is warm.  Neurological:     General: No focal deficit present.     Mental Status: She is alert and oriented to person, place, and time.  Psychiatric:        Mood and Affect: Mood normal.        Behavior: Behavior normal.      LABORATORY DATA:  I have reviewed the labs as listed.  CBC    Component Value Date/Time   WBC 5.9 08/06/2019 1239   RBC 3.56 (L) 08/06/2019 1239   HGB 10.6 (L) 08/06/2019 1239   HCT 32.8 (L) 08/06/2019 1239   PLT 225 08/06/2019 1239   MCV 92.1 08/06/2019 1239   MCH 29.8 08/06/2019 1239   MCHC 32.3 08/06/2019 1239   RDW 13.7 08/06/2019 1239   LYMPHSABS 2.3 08/06/2019 1239   MONOABS 0.5 08/06/2019 1239   EOSABS 0.2 08/06/2019 1239   BASOSABS 0.0 08/06/2019 1239   CMP Latest Ref Rng & Units 06/03/2019 03/24/2019 02/11/2019  Glucose 70 - 99 mg/dL 93 90 170(H)  BUN 8 - 23 mg/dL 23 23 28(H)  Creatinine 0.44 - 1.00 mg/dL 1.11(H) 1.23(H) 1.20(H)  Sodium 135 - 145 mmol/L 142 141 141  Potassium 3.5 - 5.1 mmol/L 4.3 5.2 4.8  Chloride 98 - 111 mmol/L 110 108 105  CO2 22 - 32 mmol/L 20(L) 24 26  Calcium 8.9 - 10.3 mg/dL 9.4 9.3 9.8  Total Protein 6.5 - 8.1 g/dL 7.4 6.5 -  Total Bilirubin 0.3 - 1.2 mg/dL 0.7 0.3 -  Alkaline Phos 38 - 126 U/L 52 - -  AST 15 - 41 U/L 24 22 -  ALT 0 - 44 U/L 12 11 -       DIAGNOSTIC IMAGING:  I have independently reviewed the scans and discussed with the patient.   I have reviewed Venita Lick LPN's note and agree with the documentation.  I personally performed a face-to-face visit, made revisions and my assessment and plan is as follows.    ASSESSMENT & PLAN:   Normocytic anemia 1.  Normocytic anemia: -Colonoscopy on 08/05/2016 showing 25 polyps less than 10 mm, redundant left colon, diverticulosis of the hepatic flexure, external hemorrhoids. 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 -Last Feraheme infusion on 12/14/2016.  She has mild degree of CKD. -We reviewed labs from 08/06/2019.  Hemoglobin is 10.6.  C16 and folic acid was normal.  Ferritin is 311 and percent saturation is 20.  She does not require any Feraheme infusion at this time.  2.  Stage I right lung cancer: -She had more than 30-pack-year smoking history.  Screening CT showed right upper lobe lung nodule. -Treated with SBRT, 9 Gray in 3 fractions in August 2019. -Last CT scan reviewed by me on 02/13/2019 showed focal area of architectural distortion in the medial aspect of the right upper lobe, unchanged, potentially postradiation changes. -I will schedule her for a CT scan without contrast for follow-up of lung cancer. -We will schedule her for a phone visit to discuss results.  She will be again scheduled for a 75-month follow-up with CT scan of the lung.  Orders placed this encounter:  Orders Placed This Encounter  Procedures  . CT Chest W Contrast  . CT Chest W Contrast  . CBC with Differential/Platelet  . Comprehensive metabolic panel  . Iron and TIBC  . Ferritin  . Vitamin B12  . Folate      Derek Jack, MD [place] [phone]

## 2019-08-16 ENCOUNTER — Ambulatory Visit: Payer: Medicare HMO

## 2019-08-27 ENCOUNTER — Other Ambulatory Visit: Payer: Self-pay

## 2019-08-27 ENCOUNTER — Ambulatory Visit (HOSPITAL_COMMUNITY)
Admission: RE | Admit: 2019-08-27 | Discharge: 2019-08-27 | Disposition: A | Discharge status: Home / Self Care | Payer: Medicare HMO | Source: Ambulatory Visit | Attending: Hematology | Admitting: Hematology

## 2019-08-27 DIAGNOSIS — C3411 Malignant neoplasm of upper lobe, right bronchus or lung: Secondary | ICD-10-CM | POA: Insufficient documentation

## 2019-08-27 DIAGNOSIS — C3491 Malignant neoplasm of unspecified part of right bronchus or lung: Secondary | ICD-10-CM | POA: Diagnosis not present

## 2019-08-27 LAB — POCT I-STAT CREATININE: Creatinine, Ser: 1.1 mg/dL — ABNORMAL HIGH (ref 0.44–1.00)

## 2019-08-27 MED ORDER — IOHEXOL 300 MG/ML  SOLN
75.0000 mL | Freq: Once | INTRAMUSCULAR | Status: AC | PRN
  Administered 2019-08-27: 75 mL via INTRAVENOUS

## 2019-09-01 ENCOUNTER — Other Ambulatory Visit: Payer: Self-pay

## 2019-09-01 ENCOUNTER — Inpatient Hospital Stay (HOSPITAL_BASED_OUTPATIENT_CLINIC_OR_DEPARTMENT_OTHER): Payer: Medicare HMO | Admitting: Hematology

## 2019-09-01 DIAGNOSIS — D649 Anemia, unspecified: Secondary | ICD-10-CM

## 2019-09-01 NOTE — Progress Notes (Signed)
Virtual Visit via Telephone Note  I connected with Charlotte Henry on 09/01/19 at  3:50 PM EDT by telephone and verified that I am speaking with the correct person using two identifiers.   I discussed the limitations, risks, security and privacy concerns of performing an evaluation and management service by telephone and the availability of in person appointments. I also discussed with the patient that there may be a patient responsible charge related to this service. The patient expressed understanding and agreed to proceed.   History of Present Illness: She was evaluated in our clinic for stage I right lung cancer and normocytic anemia.  Stage renal right lung cancer was treated with SBRT in August 2019.  Normocytic anemia was treated with Feraheme infusions.   Observations/Objective: She denies any cough or hemoptysis.  Denies any bleeding per rectum or melena.  No chest pains reported.  Numbness in the feet at nighttime has been stable.  Appetite is 50%.  Energy levels are low in general.  Assessment and Plan:  1.  Stage I right lung cancer: -SBRT with 54 gray 3 fractions in August 2019. -CT chest with contrast on 08/27/2019 reviewed by me showed radiation changes in the medial right upper lobe with no evidence of recurrence or metastatic disease. -We will plan to repeat another CT scan in 6 months.  2.  Normocytic anemia: -Colonoscopy on 08/05/2016 shows 25 polyps less than 10 mm, redundant left colon, diverticulosis of the hepatic flexure Hemorrhoids. -Last Feraheme infusion on 12/14/2016.  She also has mild degree of CKD contributing to her anemia. -Labs on 08/06/2019 shows hemoglobin 92.9 and V74 and folic acid normal.  Ferritin is 311 with percent saturation of 20.  Folic acid and B34 are normal. -We will see her back in 3 months with repeat labs.   Follow Up Instructions: RTC 3 months with labs.     I discussed the assessment and treatment plan with the patient. The patient was  provided an opportunity to ask questions and all were answered. The patient agreed with the plan and demonstrated an understanding of the instructions.   The patient was advised to call back or seek an in-person evaluation if the symptoms worsen or if the condition fails to improve as anticipated.  I provided 11 minutes of non-face-to-face time during this encounter.   Derek Jack, MD

## 2019-09-12 ENCOUNTER — Telehealth: Payer: Self-pay | Admitting: *Deleted

## 2019-09-12 NOTE — Telephone Encounter (Signed)
Okay to complete if she has medicaid now

## 2019-09-12 NOTE — Telephone Encounter (Signed)
Received call from patient.   Reports that she would like assistance with bathing and would like an aide.   Ok to complete PCS?  Medicaid Recipient ID: [id number]

## 2019-09-15 NOTE — Telephone Encounter (Signed)
PCS completed.

## 2019-10-13 ENCOUNTER — Ambulatory Visit (INDEPENDENT_AMBULATORY_CARE_PROVIDER_SITE_OTHER): Payer: Medicare Other | Admitting: *Deleted

## 2019-10-13 DIAGNOSIS — I442 Atrioventricular block, complete: Secondary | ICD-10-CM | POA: Diagnosis not present

## 2019-10-19 LAB — CUP PACEART REMOTE DEVICE CHECK
Battery Remaining Longevity: 104 mo
Battery Remaining Percentage: 95.5 %
Battery Voltage: 2.98 V
Brady Statistic AP VP Percent: 68 %
Brady Statistic AP VS Percent: 1 %
Brady Statistic AS VP Percent: 32 %
Brady Statistic AS VS Percent: 1 %
Brady Statistic RA Percent Paced: 67 %
Brady Statistic RV Percent Paced: 99 %
Date Time Interrogation Session: 20210515011304
Implantable Lead Implant Date: 20171020
Implantable Lead Implant Date: 20171020
Implantable Lead Location: 753859
Implantable Lead Location: 753860
Implantable Pulse Generator Implant Date: 20171020
Lead Channel Impedance Value: 350 Ohm
Lead Channel Impedance Value: 400 Ohm
Lead Channel Pacing Threshold Amplitude: 0.5 V
Lead Channel Pacing Threshold Amplitude: 0.75 V
Lead Channel Pacing Threshold Pulse Width: 0.4 ms
Lead Channel Pacing Threshold Pulse Width: 0.4 ms
Lead Channel Sensing Intrinsic Amplitude: 2.1 mV
Lead Channel Sensing Intrinsic Amplitude: 9.3 mV
Lead Channel Setting Pacing Amplitude: 2 V
Lead Channel Setting Pacing Amplitude: 2.5 V
Lead Channel Setting Pacing Pulse Width: 0.4 ms
Lead Channel Setting Sensing Sensitivity: 3.5 mV
Pulse Gen Model: 2272
Pulse Gen Serial Number: 3180497

## 2019-10-20 NOTE — Progress Notes (Signed)
Remote pacemaker transmission.   

## 2019-10-28 ENCOUNTER — Other Ambulatory Visit: Payer: Self-pay | Admitting: Family Medicine

## 2019-10-28 NOTE — Telephone Encounter (Signed)
Ok to refill??  Last office visit 06/09/2019.  Last refill 11/04/2018.

## 2019-11-26 ENCOUNTER — Telehealth: Payer: Self-pay

## 2019-11-26 NOTE — Telephone Encounter (Signed)
Per daughter Pt's legs and feet are black and has some swelling.  Please call the daughter [phone]   Thanks renee

## 2019-11-26 NOTE — Telephone Encounter (Signed)
Returned call to daughter. No answer. Left msg to call back.

## 2019-11-27 NOTE — Telephone Encounter (Signed)
Daughter reports mothers feet and now half way up her calves are black in color. She also has bi-lat leg swelling.She also reports that she can barely walk now. She denied heat or coldness noted from legs,denies injury.with her history of PVD I suggested she take her to the ED for evaluation. Daughter agreed.    I will FYI Dr.Koneswaran

## 2019-12-02 ENCOUNTER — Inpatient Hospital Stay (HOSPITAL_COMMUNITY): Payer: Medicare Other

## 2019-12-03 ENCOUNTER — Inpatient Hospital Stay (HOSPITAL_COMMUNITY): Payer: Medicare Other

## 2019-12-04 ENCOUNTER — Inpatient Hospital Stay (HOSPITAL_COMMUNITY): Payer: Medicare Other | Attending: Hematology

## 2019-12-04 ENCOUNTER — Other Ambulatory Visit: Payer: Self-pay

## 2019-12-04 DIAGNOSIS — C3411 Malignant neoplasm of upper lobe, right bronchus or lung: Secondary | ICD-10-CM | POA: Diagnosis not present

## 2019-12-04 DIAGNOSIS — D649 Anemia, unspecified: Secondary | ICD-10-CM | POA: Diagnosis not present

## 2019-12-04 LAB — IRON AND TIBC
Iron: 51 ug/dL (ref 28–170)
Saturation Ratios: 23 % (ref 10.4–31.8)
TIBC: 221 ug/dL — ABNORMAL LOW (ref 250–450)
UIBC: 170 ug/dL

## 2019-12-04 LAB — COMPREHENSIVE METABOLIC PANEL
ALT: 9 U/L (ref 0–44)
AST: 20 U/L (ref 15–41)
Albumin: 4.3 g/dL (ref 3.5–5.0)
Alkaline Phosphatase: 50 U/L (ref 38–126)
Anion gap: 7 (ref 5–15)
BUN: 18 mg/dL (ref 8–23)
CO2: 24 mmol/L (ref 22–32)
Calcium: 9.5 mg/dL (ref 8.9–10.3)
Chloride: 106 mmol/L (ref 98–111)
Creatinine, Ser: 0.99 mg/dL (ref 0.44–1.00)
GFR calc Af Amer: >60 mL/min (ref >60)
GFR calc non Af Amer: 54 mL/min — ABNORMAL LOW (ref >60)
Glucose, Bld: 91 mg/dL (ref 70–99)
Potassium: 4.3 mmol/L (ref 3.5–5.1)
Sodium: 137 mmol/L (ref 135–145)
Total Bilirubin: 0.6 mg/dL (ref 0.3–1.2)
Total Protein: 7.4 g/dL (ref 6.5–8.1)

## 2019-12-04 LAB — CBC WITH DIFFERENTIAL/PLATELET
Abs Immature Granulocytes: 0.01 10*3/uL (ref 0.00–0.07)
Basophils Absolute: 0 10*3/uL (ref 0.0–0.1)
Basophils Relative: 1 %
Eosinophils Absolute: 0.1 10*3/uL (ref 0.0–0.5)
Eosinophils Relative: 2 %
HCT: 32.4 % — ABNORMAL LOW (ref 36.0–46.0)
Hemoglobin: 10.5 g/dL — ABNORMAL LOW (ref 12.0–15.0)
Immature Granulocytes: 0 %
Lymphocytes Relative: 39 %
Lymphs Abs: 2.5 10*3/uL (ref 0.7–4.0)
MCH: 29.7 pg (ref 26.0–34.0)
MCHC: 32.4 g/dL (ref 30.0–36.0)
MCV: 91.8 fL (ref 80.0–100.0)
Monocytes Absolute: 0.6 10*3/uL (ref 0.1–1.0)
Monocytes Relative: 9 %
Neutro Abs: 3.1 10*3/uL (ref 1.7–7.7)
Neutrophils Relative %: 49 %
Platelets: 226 10*3/uL (ref 150–400)
RBC: 3.53 MIL/uL — ABNORMAL LOW (ref 3.87–5.11)
RDW: 14.1 % (ref 11.5–15.5)
WBC: 6.4 10*3/uL (ref 4.0–10.5)
nRBC: 0 % (ref 0.0–0.2)

## 2019-12-04 LAB — FERRITIN: Ferritin: 293 ng/mL (ref 11–307)

## 2019-12-09 ENCOUNTER — Inpatient Hospital Stay (HOSPITAL_BASED_OUTPATIENT_CLINIC_OR_DEPARTMENT_OTHER): Payer: Medicare Other | Admitting: Nurse Practitioner

## 2019-12-09 DIAGNOSIS — D649 Anemia, unspecified: Secondary | ICD-10-CM | POA: Diagnosis not present

## 2019-12-09 NOTE — Progress Notes (Signed)
[facility] Follow up:    Charlotte Rossetti, MD [address] [place] [address]   DIAGNOSIS: Normocytic anemia and history of lung cancer  CURRENT THERAPY: Observation  INTERVAL HISTORY: Charlotte Henry 79 y.o. female was called for telephone visit for normocytic anemia and history of lung cancer.  Patient reports she is doing well since her last visit.  She does report she gets occasionally short of breath with exertion.  She denies any bright red bleeding per rectum or melena.  She denies any easy bruising or bleeding. Denies any nausea, vomiting, or diarrhea. Denies any new pains. Had not noticed any recent bleeding such as epistaxis, hematuria or hematochezia. Denies recent chest pain on exertion, shortness of breath on minimal exertion, pre-syncopal episodes, or palpitations. Denies any numbness or tingling in hands or feet. Denies any recent fevers, infections, or recent hospitalizations. Patient reports appetite at 75% and energy level at 50%.    Patient Active Problem List   Diagnosis Date Noted  . Cognitive change 03/25/2019  . B12 deficiency 03/24/2019  . AAA (abdominal aortic aneurysm) ([facility]) 02/17/2019  . Vertigo 01/30/2019  . 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[place]) 01/10/2018  . Poor appetite 12/19/2017  . Itching 07/19/2017  . Gait instability 02/26/2017  . Symptomatic anemia 08/15/2016  . Protein-calorie malnutrition ([facility]) 08/04/2016  . Helicobacter pylori gastritis 07/26/2016  . Generalized weakness 07/09/2016  . Normocytic anemia 07/03/2016  . CHB (complete heart block) ([facility] [place])   . COPD (chronic obstructive pulmonary disease) ([facility])   . Tobacco use disorder 10/15/2015  . Back pain 12/30/2014  . OA (osteoarthritis) 12/30/2014  . Carotid stenosis 03/11/2014  . PVD (peripheral vascular disease) ([facility]) 03/11/2014  . Colon cancer screening 02/04/2014  . Overactive bladder 12/31/2013  . Rash and nonspecific skin eruption  05/19/2013  . Numbness and tingling-bilat foot 03/05/2013  . IBS (irritable bowel syndrome) 12/30/2012  . Peripheral neuropathy 07/01/2012  . Insomnia 07/01/2012  . Chronic kidney disease (CKD), stage III (moderate) 08/25/2010  . Cardiovascular disease 08/16/2009  . Cerebrovascular disease 08/16/2009  . Peripheral vascular disease ([facility]) 08/16/2009  . Type 2 diabetes mellitus with diabetic neuropathy, unspecified ([facility]) 10/26/2008  . Hyperlipidemia 10/26/2008  . Gout, unspecified 10/26/2008  . Essential hypertension 10/26/2008  . Asthmatic bronchitis 10/26/2008  . Gastroesophageal reflux disease 10/26/2008    is allergic to ace inhibitors, aspirin, codeine, pineapple, plasticized base [plastibase], ultram [tramadol], adhesive [tape], naprosyn [naproxen], biaxin [clarithromycin], clindamycin/lincomycin, linzess [linaclotide], metronidazole, nsaids, penicillins, varenicline tartrate, and xarelto [rivaroxaban].  MEDICAL HISTORY: Past Medical History:  Diagnosis Date  . Arterial fibromuscular dysplasia ([place] [facility])   . ASCVD (arteriosclerotic cardiovascular disease)    a. cath in 4/04,-80%LAD; 70% RCA, -> DESx2; residual 60% distal RCA; nl EF  b. Nuc 02/2016 aborted due to bradycardia. Will need to be rescheduled   . Carotid artery occlusion   . Cerebrovascular disease   . CHB (complete heart block) ([facility])    a. s/p PPM on 03/24/16 with a St. Jude (serial number [id number]) pacemaker  . Chronic kidney disease (CKD), stage III (moderate)   . Clostridium difficile colitis 03/2013   a. 03/2013  . COPD (chronic obstructive pulmonary disease) ([facility])   . Coronary disease   . Diverticulosis   . GERD (gastroesophageal reflux disease)   . Gout   . H pylori ulcer   . Hyperlipidemia    chose to stop statin, takes Zetia  . Hypertension    not  currently on medication  . IBS (irritable bowel syndrome)   . PVD (peripheral vascular disease) ([facility])    a. moderate to severely decreased ABI's in 8/08, sig.  aortic inflow disease b. repeat ABI's in 2011 improved- mild disease on the left and moderate to severe disease on the right  . S/P placement of cardiac pacemaker    a. 03/24/16: St. Jude (serial number [id number]) pacemaker  . Symptomatic anemia 08/15/2016  . TIA (transient ischemic attack) 2012  . Tobacco abuse    a. 50 pack years continuing at 1/2 pack per day  . Tobacco use disorder 10/15/2015    SURGICAL HISTORY: Past Surgical History:  Procedure Laterality Date  . ABDOMINAL HYSTERECTOMY    . carpel tunnel release     bilateral  . CHOLECYSTECTOMY    . COLONOSCOPY  2009  . COLONOSCOPY N/A 08/04/2016   incomplete due to prep.   . COLONOSCOPY N/A 08/05/2016   Dr. Oneida Alar: moderately redundant rectosigmoid and sigmoid, normal TI, single large-mouthed diverticulum in hepatic flexure, external hemorrhoids, path negative for microscopic colitis   . ENTEROSCOPY N/A 08/18/2016   Procedure: ENTEROSCOPY;  Surgeon: Daneil Dolin, MD;  [facility];  Service: Endoscopy;  Laterality: N/A;  Pediatric colonoscope  . EP IMPLANTABLE DEVICE N/A 03/24/2016   Procedure: Pacemaker Implant;  Surgeon: [name], MD;  Location: [facility];  Service: Cardiovascular;  Laterality: N/A;  . ESOPHAGOGASTRODUODENOSCOPY N/A 08/04/2016   Dr. Oneida Alar: non-critical Schatzki's ring, small hiatal hernia, +H.pylori gastritis on path.   Marland Kitchen GIVENS CAPSULE STUDY N/A 08/16/2016   Procedure: GIVENS CAPSULE STUDY;  Surgeon: Daneil Dolin, MD;  Location: [facility];  Service: Endoscopy;  Laterality: N/A;  . TOTAL ABDOMINAL HYSTERECTOMY W/ BILATERAL SALPINGOOPHORECTOMY  1979    SOCIAL HISTORY: Social History   Socioeconomic History  . Marital status: Widowed    Spouse name: Not on file  . Number of children: Not on file  . Years of education: Not on file  . Highest education level: Not on file  Occupational History  . Not on file  Tobacco Use  . Smoking status: Current Every Day Smoker    Packs/day:  1.00    Years: 50.00    Pack years: 50.00    Types: Cigarettes    Start date: 02/02/1966  . Smokeless tobacco: Never Used  . Tobacco comment: 1/2 pack daily  Vaping Use  . Vaping Use: Never used  Substance and Sexual Activity  . Alcohol use: No    Alcohol/week: 0.0 standard drinks    Comment: H/O of 6 pack per day quiting in 2003  . Drug use: No  . Sexual activity: Not Currently  Other Topics Concern  . Not on file  Social History Narrative  . Not on file   Social Determinants of Health   Financial Resource Strain:   . Difficulty of Paying Living Expenses:   Food Insecurity:   . Worried About Charity fundraiser in the Last Year:   . Arboriculturist in the Last Year:   Transportation Needs:   . Film/video editor (Medical):   Marland Kitchen Lack of Transportation (Non-Medical):   Physical Activity:   . Days of Exercise per Week:   . Minutes of Exercise per Session:   Stress:   . Feeling of Stress :   Social Connections:   . Frequency of Communication with Friends and Family:   . Frequency of Social Gatherings with Friends and Family:   .  Attends Religious Services:   . Active Member of Clubs or Organizations:   . Attends Archivist Meetings:   Marland Kitchen Marital Status:   Intimate Partner Violence:   . Fear of Current or Ex-Partner:   . Emotionally Abused:   Marland Kitchen Physically Abused:   . Sexually Abused:     FAMILY HISTORY: Family History  Problem Relation Age of Onset  . Liver disease Mother 50  . Hypertension Mother   . Cerebral aneurysm Father   . Aneurysm Father 10  . Cancer Sister   . Liver disease Brother   . Liver disease Brother   . Liver disease Brother   . Diabetes type II Brother   . Alcohol abuse Brother   . Colon cancer Neg Hx     Review of Systems  All other systems reviewed and are negative.   Vital signs: -Deferred due to telephone visit  Physical Exam -Deferred due to telephone visit -Patient was alert and oriented over the phone in no acute  distress   LABORATORY DATA:  CBC    Component Value Date/Time   WBC 6.4 12/04/2019 1458   RBC 3.53 (L) 12/04/2019 1458   HGB 10.5 (L) 12/04/2019 1458   HCT 32.4 (L) 12/04/2019 1458   PLT 226 12/04/2019 1458   MCV 91.8 12/04/2019 1458   MCH 29.7 12/04/2019 1458   MCHC 32.4 12/04/2019 1458   RDW 14.1 12/04/2019 1458   LYMPHSABS 2.5 12/04/2019 1458   MONOABS 0.6 12/04/2019 1458   EOSABS 0.1 12/04/2019 1458   BASOSABS 0.0 12/04/2019 1458    CMP     Component Value Date/Time   NA 137 12/04/2019 1458   K 4.3 12/04/2019 1458   CL 106 12/04/2019 1458   CO2 24 12/04/2019 1458   GLUCOSE 91 12/04/2019 1458   BUN 18 12/04/2019 1458   CREATININE 0.99 12/04/2019 1458   CREATININE 1.23 (H) 03/24/2019 1633   CALCIUM 9.5 12/04/2019 1458   PROT 7.4 12/04/2019 1458   ALBUMIN 4.3 12/04/2019 1458   AST 20 12/04/2019 1458   ALT 9 12/04/2019 1458   ALKPHOS 50 12/04/2019 1458   BILITOT 0.6 12/04/2019 1458   GFRNONAA 54 (L) 12/04/2019 1458   GFRNONAA 49 (L) 10/22/2017 1404   GFRAA >60 12/04/2019 1458   GFRAA 57 (L) 10/22/2017 1404   All questions were answered to patient's stated satisfaction. Encouraged patient to call with any new concerns or questions before his next visit to the cancer center and we can certain see him sooner, if needed.     ASSESSMENT and THERAPY PLAN:   Normocytic anemia 1.  Normocytic anemia: -Colonoscopy on 08/05/2016 showing 25 polyps less than 10 mm, redundant left colon, diverticulosis of the hepatic flexure, external hemorrhoids. 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 -Last Feraheme infusion on 12/14/2016.  She has mild degree of CKD. -Labs done on 12/04/2019 showed hemoglobin 10.5, ferritin 293, percent saturation 23 -She does not require any Feraheme infusions at this time. -Which she will follow-up in  September 2021 with repeat labs.  2.  Stage I right lung cancer: -She had more than 30-pack-year smoking history. -Screening CT showed right upper lobe lung nodule. -Treated with SBRT, 30 Gray in 3 fractions in August 2019. -CT scan on 02/13/2019 showed focal area of architectural distortion in the medial aspect of the right upper lobe, unchanged, potentially postradiation changes. -CT scan  on 08/27/2019 showed suspected radiation changes in the medial right upper lobe, no evidence of recurrent or metastatic disease. -She will follow-up in September 2021 for repeat scan and labs.   Orders Placed This Encounter  Procedures  . Lactate dehydrogenase    Standing Status:   Future    Standing Expiration Date:   12/08/2020  . CBC with Differential/Platelet    Standing Status:   Future    Standing Expiration Date:   12/08/2020  . Comprehensive metabolic panel    Standing Status:   Future    Standing Expiration Date:   12/08/2020  . Ferritin    Standing Status:   Future    Standing Expiration Date:   12/08/2020  . Iron and TIBC    Standing Status:   Future    Standing Expiration Date:   12/08/2020  . VITAMIN D 25 Hydroxy (Vit-D Deficiency, Fractures)    Standing Status:   Future    Standing Expiration Date:   12/08/2020  . Vitamin B12    Standing Status:   Future    Standing Expiration Date:   12/08/2020  . Folate    Standing Status:   Future    Standing Expiration Date:   12/08/2020    All questions were answered. The patient knows to call the clinic with any problems, questions or concerns. We can certainly see the patient much sooner if necessary. This note was electronically signed.  I provided 30 minutes of non face-to-face telephone visit time during this encounter, and > 50% was spent counseling as documented under my assessment & plan.   Glennie Isle, NP-C 12/09/2019

## 2019-12-09 NOTE — Assessment & Plan Note (Signed)
1.  Normocytic anemia: -Colonoscopy on 08/05/2016 showing 25 polyps less than 10 mm, redundant left colon, diverticulosis of the hepatic flexure, external hemorrhoids. 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 -Last Feraheme infusion on 12/14/2016.  She has mild degree of CKD. -Labs done on 12/04/2019 showed hemoglobin 10.5, ferritin 293, percent saturation 23 -She does not require any Feraheme infusions at this time. -Which she will follow-up in September 2021 with repeat labs.  2.  Stage I right lung cancer: -She had more than 30-pack-year smoking history. -Screening CT showed right upper lobe lung nodule. -Treated with SBRT, 57 Gray in 3 fractions in August 2019. -CT scan on 02/13/2019 showed focal area of architectural distortion in the medial aspect of the right upper lobe, unchanged, potentially postradiation changes. -CT scan on 08/27/2019 showed suspected radiation changes in the medial right upper lobe, no evidence of recurrent or metastatic disease. -She will follow-up in September 2021 for repeat scan and labs.

## 2019-12-11 ENCOUNTER — Other Ambulatory Visit: Payer: Self-pay

## 2019-12-11 ENCOUNTER — Telehealth (INDEPENDENT_AMBULATORY_CARE_PROVIDER_SITE_OTHER): Payer: Medicare Other | Admitting: Student

## 2019-12-11 ENCOUNTER — Telehealth: Payer: Self-pay | Admitting: *Deleted

## 2019-12-11 ENCOUNTER — Encounter: Payer: Self-pay | Admitting: Student

## 2019-12-11 VITALS — BP 130/70 | Ht 67.0 in | Wt 147.0 lb

## 2019-12-11 DIAGNOSIS — I739 Peripheral vascular disease, unspecified: Secondary | ICD-10-CM

## 2019-12-11 DIAGNOSIS — N183 Chronic kidney disease, stage 3 unspecified: Secondary | ICD-10-CM

## 2019-12-11 DIAGNOSIS — I480 Paroxysmal atrial fibrillation: Secondary | ICD-10-CM

## 2019-12-11 DIAGNOSIS — I442 Atrioventricular block, complete: Secondary | ICD-10-CM

## 2019-12-11 DIAGNOSIS — I6523 Occlusion and stenosis of bilateral carotid arteries: Secondary | ICD-10-CM

## 2019-12-11 DIAGNOSIS — I25118 Atherosclerotic heart disease of native coronary artery with other forms of angina pectoris: Secondary | ICD-10-CM

## 2019-12-11 NOTE — Progress Notes (Signed)
Virtual Visit via Telephone Note   This visit type was conducted due to national recommendations for restrictions regarding the COVID-19 Pandemic (e.g. social distancing) in an effort to limit this patient's exposure and mitigate transmission in our community.  Due to her co-morbid illnesses, this patient is at least at moderate risk for complications without adequate follow up.  This format is felt to be most appropriate for this patient at this time.  The patient did not have access to video technology/had technical difficulties with video requiring transitioning to audio format only (telephone).  All issues noted in this document were discussed and addressed.  No physical exam could be performed with this format.  Please refer to the patient's chart for her  consent to telehealth for [facility]   The patient was identified using 2 identifiers.  Date:  12/11/2019   ID:  Charlotte Henry, DOB 04/21/1941, MRN [id number]  Patient Location: Home Provider Location: Office/Clinic  PCP:  Alycia Rossetti, MD  Cardiologist:  Kate Sable, MD  Electrophysiologist:  Cristopher Peru, MD   Evaluation Performed:  Follow-Up Visit  Chief Complaint: 6 month visit  History of Present Illness:    Charlotte Henry is a 79 y.o. female with past medical history of CAD (s/p DES to mid-LAD and DES to RCA in 2004), CHB (s/p PPM placement in 2017), paroxysmal atrial fibrillation (not an anticoagulation candidate by review of notes), PAD (bilateral infrainguinal arterial occlusive disease), carotid artery disease, chronic anemia, history of lung cancer, HTN, HLD and Stage 3 CKD who presents for a 73-month follow-up telehealth visit.  She had a phone visit with Dr. Bronson Ing in 06/2019 and denied any recent chest pain, palpitations or dyspnea at that time. She was having issues with lower extremity edema and was utilizing compression stockings with improvement in her symptoms. She was referred back to  Vascular Surgery given her known PAD and claudication symptoms. She also had a phone visit with Dr. Lovena Le in 07/2019 and did report having peripheral edema at that time and Lasix was represcribed 40 mg daily.  She was initially scheduled as an office visit but switched to a phone visit due to transportation issues.   In talking with the patient today, she reports her lower extremity edema has improved since her last visit. She was prescribed as taking Lasix 40 mg daily but says she only takes this as needed and has not utilized this within several weeks. Reports that weight has overall been stable on her home scales. She has experienced worsening discoloration along her ankles bilaterally and was referred back to Vascular Surgery by her PCP with an appointment scheduled in 02/2020. She denies any specific claudication symptoms.  No recent chest pain, palpitations, orthopnea or PND. She does report having baseline dyspnea on exertion but accredits this to being less active. Says that she typically sits in her recliner a majority of the day. She does go to the [employer] a few days each week and plays shuffleboard and card games.  The patient does not have symptoms concerning for COVID-19 infection (fever, chills, cough, or new shortness of breath).    Past Medical History:  Diagnosis Date   Arterial fibromuscular dysplasia (HCC)    ASCVD (arteriosclerotic cardiovascular disease)    a. cath in 4/04,-80%LAD; 70% RCA, -> DESx2; residual 60% distal RCA; nl EF  b. Nuc 02/2016 aborted due to bradycardia. Will need to be rescheduled    Carotid artery occlusion  Cerebrovascular disease    CHB (complete heart block) (HCC)    a. s/p PPM on 03/24/16 with a [employer] Jude (serial number [id number]) pacemaker   Chronic kidney disease (CKD), stage III (moderate)    Clostridium difficile colitis 03/2013   a. 03/2013   COPD (chronic obstructive pulmonary disease) ([place])    Coronary disease     Diverticulosis    GERD (gastroesophageal reflux disease)    Gout    H pylori ulcer    Hyperlipidemia    chose to stop statin, takes Zetia   Hypertension    not currently on medication   IBS (irritable bowel syndrome)    PVD (peripheral vascular disease) (HCC)    a. moderate to severely decreased ABI's in 8/08, sig. aortic inflow disease b. repeat ABI's in 2011 improved- mild disease on the left and moderate to severe disease on the right   S/P placement of cardiac pacemaker    a. 03/24/16: St[employer] Jude (serial number [id number]) pacemaker   Symptomatic anemia 08/15/2016   TIA (transient ischemic attack) 2012   Tobacco abuse    a. 50 pack years continuing at 1/2 pack per day   Tobacco use disorder 10/15/2015   Past Surgical History:  Procedure Laterality Date   ABDOMINAL HYSTERECTOMY     carpel tunnel release     bilateral   CHOLECYSTECTOMY     COLONOSCOPY  2009   COLONOSCOPY N/A 08/04/2016   incomplete due to prep.    COLONOSCOPY N/A 08/05/2016   Dr. Oneida Alar: moderately redundant rectosigmoid and sigmoid, normal TI, single large-mouthed diverticulum in hepatic flexure, external hemorrhoids, path negative for microscopic colitis    ENTEROSCOPY N/A 08/18/2016   Procedure: ENTEROSCOPY;  Surgeon: Daneil Dolin, MD;  Location: [facility];  Service: Endoscopy;  Laterality: N/A;  Pediatric colonoscope   EP IMPLANTABLE DEVICE N/A 03/24/2016   Procedure: Pacemaker Implant;  Surgeon: [name], MD;  Location: [facility];  Service: Cardiovascular;  Laterality: N/A;   ESOPHAGOGASTRODUODENOSCOPY N/A 08/04/2016   Dr. Oneida Alar: non-critical Schatzki's ring, small hiatal hernia, +H.pylori gastritis on path.    GIVENS CAPSULE STUDY N/A 08/16/2016   Procedure: GIVENS CAPSULE STUDY;  Surgeon: Daneil Dolin, MD;  Location: [facility];  Service: Endoscopy;  Laterality: N/A;   TOTAL ABDOMINAL HYSTERECTOMY W/ BILATERAL SALPINGOOPHORECTOMY  1979     Current Meds  Medication  Sig   albuterol (PROVENTIL HFA;VENTOLIN HFA) 108 (90 Base) MCG/ACT inhaler Inhale 2 puffs into the lungs every 4 (four) hours as needed for wheezing or shortness of breath.   ALPRAZolam (XANAX) 0.25 MG tablet TAKE 1 TABLET BY MOUTH AT BEDTIME AS NEEDED.   calcium citrate-vitamin D (CITRACAL+D) 315-200 MG-UNIT per tablet Take 1 tablet by mouth daily.     Cyanocobalamin (VITAMIN B 12 PO) Take 1 tablet by mouth daily.    donepezil (ARICEPT) 5 MG tablet Take 1 tablet (5 mg total) by mouth at bedtime.   escitalopram (LEXAPRO) 10 MG tablet TAKE 1 TABLET BY MOUTH ONCE A DAY.   esomeprazole (NEXIUM) 40 MG capsule TAKE 1 CAPSULE BY MOUTH DAILY BEFORE BREAKFAST.   ezetimibe (ZETIA) 10 MG tablet Take 1 tablet (10 mg total) by mouth daily.   ferrous sulfate 325 (65 FE) MG tablet Take 325 mg by mouth daily with breakfast.   gabapentin (NEURONTIN) 100 MG capsule TAKE 1 TO 2 CAPSULES BY MOUTH AT BEDTIME FOR NERVE PAIN. (Patient taking differently: Take 100 mg by mouth at bedtime. )  HYDROcodone-acetaminophen (NORCO/VICODIN) 5-325 MG tablet TAKE 1 TABLET BY MOUTH TWICE DAILY AS NEEDED FOR MODERATE PAIN.   hydrOXYzine (ATARAX/VISTARIL) 25 MG tablet TAKE (1) TABLET BY MOUTH EVERY (8) HOURS AS NEEDED FOR ITCHING.   Multiple Vitamin (MULTIVITAMIN WITH MINERALS) TABS tablet Take 1 tablet by mouth daily.     Allergies:   Ace inhibitors, Aspirin, Codeine, Pineapple, Plasticized base [plastibase], Ultram [tramadol], Adhesive [tape], Naprosyn [naproxen], Biaxin [clarithromycin], Clindamycin/lincomycin, Linzess [linaclotide], Metronidazole, Nsaids, Penicillins, Varenicline tartrate, and Xarelto [rivaroxaban]   Social History   Tobacco Use   Smoking status: Current Every Day Smoker    Packs/day: 1.00    Years: 50.00    Pack years: 50.00    Types: Cigarettes    Start date: 02/02/1966   Smokeless tobacco: Never Used   Tobacco comment: 1/2 pack daily  Vaping Use   Vaping Use: Never used  Substance  Use Topics   Alcohol use: No    Alcohol/week: 0.0 standard drinks    Comment: H/O of 6 pack per day quiting in 2003   Drug use: No     Family Hx: The patient's family history includes Alcohol abuse in her brother; Aneurysm (age of onset: 54) in her father; Cancer in her sister; Cerebral aneurysm in her father; Diabetes type II in her brother; Hypertension in her mother; Liver disease in her brother, brother, and brother; Liver disease (age of onset: 13) in her mother. There is no history of Colon cancer.  ROS:   Please see the history of present illness.     All other systems reviewed and are negative.   Prior CV studies:   The following studies were reviewed today:  Echocardiogram: 01/2019 IMPRESSIONS    1. Stage 1: stage1: Apex is abnormal.  2. The left ventricle has normal systolic function, with an ejection  fraction of 55-60%. The cavity size was normal. Moderate basal septal  hypertrophy. Otherwise, mild concentric LVH of remaining myocardium. Left  ventricular diastolic Doppler parameters  are consistent with impaired relaxation.  3. The right ventricle has normal systolic function. The cavity was  normal. There is no increase in right ventricular wall thickness.  4. Left atrial size was mildly dilated.  5. The mitral valve is grossly normal.  6. The tricuspid valve is grossly normal.  7. The aortic valve is tricuspid. Mild thickening of the aortic valve.  Mild calcification of the aortic valve.  8. The aorta is normal unless otherwise noted.   Labs/Other Tests and Data Reviewed:    EKG:  No ECG reviewed.  Recent Labs: 01/30/2019: TSH 1.518 06/03/2019: B Natriuretic Peptide 71.6 12/04/2019: ALT 9; BUN 18; Creatinine, Ser 0.99; Hemoglobin 10.5; Platelets 226; Potassium 4.3; Sodium 137   Recent Lipid Panel Lab Results  Component Value Date/Time   CHOL 168 06/28/2018 01:02 PM   TRIG 112 06/28/2018 01:02 PM   TRIG 112 08/11/2008 12:00 AM   HDL 47 (L)  06/28/2018 01:02 PM   CHOLHDL 3.6 06/28/2018 01:02 PM   LDLCALC 100 (H) 06/28/2018 01:02 PM    Wt Readings from Last 3 Encounters:  12/11/19 147 lb (66.7 kg)  08/14/19 144 lb (65.3 kg)  07/25/19 144 lb (65.3 kg)     Objective:    Vital Signs:  BP 130/70    Ht 5\' 7"  (1.702 m)    Wt 147 lb (66.7 kg)    BMI 23.02 kg/m    General: Pleasant female sounding in NAD Psych: Normal affect. Neuro: Alert and oriented X 3.  Lungs:  Resp regular and unlabored while talking on the phone.    ASSESSMENT & PLAN:    1. CAD - She is s/p DES to mid-LAD and DES to RCA in 2004. Echocardiogram in 2020 showed a preserved EF as outlined above and apex was reported as being hypokinetic. - She reports her breathing has been at baseline and she denies any recent chest pain. She is not active at baseline and we reviewed ways to try to increase her physical activity. Continue current medication regimen with Atorvastatin 10 mg 3 times weekly and Zetia 10 mg daily. She has been intolerant to ASA in the past.  2. CHB - She is s/p PPM placement in 2017 which is followed by Dr. Lovena Le. Most recent device interrogation 10/2019 showed normal device function.  3. Paroxysmal atrial fibrillation  - By review of notes, she has a history of atrial fibrillation with no recent occurrences. This patients CHA2DS2-VASc Score and unadjusted Ischemic Stroke Rate (% per year) is equal to 11.2 % stroke rate/year from a score of 7 (HTN, Vascular, Female, Age (2), and CVA (2)). By review of Dr. Court Joy notes, she has not been an anticoagulation candidate (possibly due to chronic anemia and history of GIB as she is listed as being allergic to Xarelto due to bleeding).   4. PVD - She has a history of bilateral infrainguinal arterial occlusive disease and was previously followed by Vascular and has been lost to follow-up. She is scheduled to re-establish care with Dr. Scot Dock in 02/2020. She reports discoloration along her ankles but  was unable to evaluate today as she switched to a Virtual Visit. She denies any claudication. She reports her prior edema has resolved and she uses Lasix 40mg  on a PRN basis.    5. Carotid Artery Stenosis - She has known carotid artery stenosis with dopplers in 16/6060 showing 04-59% LICA stenosis with plans for repeat dopplers in 1 year. Continue statin therapy.   6. Stage 3 CKD - Baseline creatinine 1.1 - 1.2. Improved to 0.99 by repeat labs earlier this month.    COVID-19 Education: The signs and symptoms of COVID-19 were discussed with the patient and how to seek care for testing (follow up with PCP or arrange E-visit).  The importance of social distancing was discussed today.  Time:   Today, I have spent 12 minutes with the patient with telehealth technology discussing the above problems.     Medication Adjustments/Labs and Tests Ordered: Current medicines are reviewed at length with the patient today.  Concerns regarding medicines are outlined above.   Tests Ordered: No orders of the defined types were placed in this encounter.   Medication Changes: No orders of the defined types were placed in this encounter.   Follow Up: Keep scheduled follow-up with Dr. Lovena Le in 01/2020.  Signed, Erma Heritage, PA-C  12/11/2019 5:44 PM    [facility]

## 2019-12-11 NOTE — Patient Instructions (Signed)
Medication Instructions:  Your physician recommends that you continue on your current medications as directed. Please refer to the Current Medication list given to you today.  *If you need a refill on your cardiac medications before your next appointment, please call your pharmacy*   Lab Work: NONE   If you have labs (blood work) drawn today and your tests are completely normal, you will receive your results only by: Marland Kitchen MyChart Message (if you have MyChart) OR . A paper copy in the mail If you have any lab test that is abnormal or we need to change your treatment, we will call you to review the results.   Testing/Procedures: NONE    Follow-Up: At [facility], you and your health needs are our priority.  As part of our continuing mission to provide you with exceptional heart care, we have created designated Provider Care Teams.  These Care Teams include your primary Cardiologist (physician) and Advanced Practice Providers (APPs -  Physician Assistants and Nurse Practitioners) who all work together to provide you with the care you need, when you need it.  We recommend signing up for the patient portal called "MyChart".  Sign up information is provided on this After Visit Summary.  MyChart is used to connect with patients for Virtual Visits (Telemedicine).  Patients are able to view lab/test results, encounter notes, upcoming appointments, etc.  Non-urgent messages can be sent to your provider as well.   To learn more about what you can do with MyChart, go to [email or web address].    Your next appointment:   1 month(s)  The format for your next appointment:   In Person  Provider:   Cristopher Peru, MD   Other Instructions Thank you for choosing [facility]!

## 2019-12-11 NOTE — Telephone Encounter (Signed)
Received call from patient daughter Anderson Malta.   Reports that patient has dark discolorations to BLE and she thinks there is an issue with her circulation.   Advised to have patient be seen by cards or go to ER if she feels that it may be gangrenous.   States that she tried to get patient appointment with cards, but patient changed it to telehealth.   States that patient will not go to ER.   Call placed to patient. Appointment scheduled with PCP for 12/12/2019. Of note, I did not elaborate on what the appointment was scheduled for.   PCP to be made aware.

## 2019-12-12 ENCOUNTER — Telehealth: Payer: Self-pay | Admitting: Family Medicine

## 2019-12-12 ENCOUNTER — Ambulatory Visit: Payer: Self-pay | Admitting: Family Medicine

## 2019-12-12 NOTE — Telephone Encounter (Signed)
CB# [phone] Scotlyn call would like for Dr.Heyworth also her nurse do not discuss  any information with  her daughter Cheron Every  also advance pt to stop by the office to sign HIPP form fill out also

## 2019-12-12 NOTE — Telephone Encounter (Signed)
noted 

## 2019-12-16 ENCOUNTER — Ambulatory Visit (INDEPENDENT_AMBULATORY_CARE_PROVIDER_SITE_OTHER): Payer: Medicare Other | Admitting: Family Medicine

## 2019-12-16 ENCOUNTER — Encounter: Payer: Self-pay | Admitting: Family Medicine

## 2019-12-16 ENCOUNTER — Other Ambulatory Visit: Payer: Self-pay

## 2019-12-16 VITALS — BP 130/58 | HR 60 | Temp 97.7°F | Resp 18 | Wt 144.2 lb

## 2019-12-16 DIAGNOSIS — M19012 Primary osteoarthritis, left shoulder: Secondary | ICD-10-CM

## 2019-12-16 DIAGNOSIS — M48061 Spinal stenosis, lumbar region without neurogenic claudication: Secondary | ICD-10-CM

## 2019-12-16 DIAGNOSIS — E1140 Type 2 diabetes mellitus with diabetic neuropathy, unspecified: Secondary | ICD-10-CM | POA: Diagnosis not present

## 2019-12-16 DIAGNOSIS — I739 Peripheral vascular disease, unspecified: Secondary | ICD-10-CM

## 2019-12-16 DIAGNOSIS — E7800 Pure hypercholesterolemia, unspecified: Secondary | ICD-10-CM | POA: Diagnosis not present

## 2019-12-16 MED ORDER — ESCITALOPRAM OXALATE 10 MG PO TABS: 10.0000 mg | ORAL_TABLET | Freq: Every day | ORAL | 6 refills | Status: DC

## 2019-12-16 MED ORDER — PREDNISONE 20 MG PO TABS: ORAL_TABLET | ORAL | 0 refills | Status: DC

## 2019-12-16 MED ORDER — DONEPEZIL HCL 5 MG PO TABS: 5.0000 mg | ORAL_TABLET | Freq: Every day | ORAL | 6 refills | Status: DC

## 2019-12-16 MED ORDER — LIDOCAINE 5 % EX PTCH: 1.0000 | MEDICATED_PATCH | CUTANEOUS | 0 refills | Status: DC

## 2019-12-16 NOTE — Assessment & Plan Note (Signed)
Known PVD with venous stasis changes, hyperpigmentatin, no ulceration to feet Take lasix daily, elevate feet, compression hose Keep appt with vascular

## 2019-12-16 NOTE — Assessment & Plan Note (Signed)
Shoulder pain likely due to her OA Side pain more MSK relieved with tylenol, has known spinal stenosis Nothing on skin to resemble shingles  For now try prednisone x 7 days for inflammation Continue tylenol, topical lidoderm patche also help She declined orthopedics for injection/evaluation

## 2019-12-16 NOTE — Patient Instructions (Addendum)
Take the prednisone as prescribed for inflammation  Take the lasix daily  Use lidoderm patch We will call with lab results  F/U 4 months

## 2019-12-16 NOTE — Assessment & Plan Note (Signed)
She is maintaining her weight  No current meds

## 2019-12-16 NOTE — Progress Notes (Signed)
Subjective:    Patient ID: Charlotte Henry, female    DOB: [birth year], 79 y.o.   MRN: [id number]  Patient presents for Follow-up  Pt here to f/u chronic medical oroblems    Pt here with left sided pain, that started 2 days, no know injury, no rash redness Moving around cause more pain , has pain in lower back and left arm If she tries to elevate her arm to shoulder height has pain so she stops. It does feel simi;ar to her bursitis in the past Deep breaths causes some pain as well in the shoulder, but denies chest pain or palpitations She has taken tylenol and gabapentin and that helped  PVD-she had telehealth visit with cardiology.  She told him that her feet were swelling more and they were more dark.  She has been referred to vascular but her appointment is not until September.  She has not been taking her Lasix or wearing the compression hose.  Review of medication she is taking her Lexapro every night states that it helps.  She rarely takes alprazolam.  She has not been taking the Aricept regularly takes it randomly.  Hyperlipidemia she is still taking her statin drug.  She is due for lipid panel last done in January 2020.  Diabetes  mellitus diet-controlled due for repeat A1c.  Pt here today with daughter  Review Of Systems:  GEN- denies fatigue, fever, weight loss,weakness, recent illness HEENT- denies eye drainage, change in vision, nasal discharge, CVS- denies chest pain, palpitations RESP- denies SOB, cough, wheeze ABD- denies N/V, change in stools, abd pain GU- denies dysuria, hematuria, dribbling, incontinence MSK- +joint pain, muscle aches, injury Neuro- denies headache, dizziness, syncope, seizure activity       Objective:    BP (!) 130/58   Pulse 60   Temp 97.7 F (36.5 C)   Resp 18   Wt 144 lb 3.2 oz (65.4 kg)   SpO2 97%   BMI 22.58 kg/m  GEN- NAD, alert and oriented x3,able to walk a few feet, brought in wheelchair   examined sitting in chair -  difficult  HEENT- PERRL, EOMI, non injected sclera, pink conjunctiva, MMM, oropharynx clear Neck- Supple,fair ROM neck   CVS- RRR, no murmur RESP-CTAB ABD-NABS,soft,NT,ND MSK- decreased ROM LUE compared to right, TTP near [facility], biceps grossly intact, TTP along side wall left side and left paraspinals EXT-  1+ pitting edema, venous stasis changes, darkened skin over feet, shins, no erythema  Skin in tact  Pulses- Radial 2+ , DP-diminished         Assessment & Plan:      Problem List Items Addressed This Visit      Unprioritized   Hyperlipidemia   Relevant Orders   Lipid panel   OA (osteoarthritis)    Shoulder pain likely due to her OA Side pain more MSK relieved with tylenol, has known spinal stenosis Nothing on skin to resemble shingles  For now try prednisone x 7 days for inflammation Continue tylenol, topical lidoderm patche also help She declined orthopedics for injection/evaluation       Relevant Medications   predniSONE (DELTASONE) 20 MG tablet   Peripheral vascular disease (HCC) - Primary    Known PVD with venous stasis changes, hyperpigmentatin, no ulceration to feet Take lasix daily, elevate feet, compression hose Keep appt with vascular       Type 2 diabetes mellitus with diabetic neuropathy, unspecified ([place])    She is maintaining her weight  No  current meds       Relevant Orders   Hemoglobin A1c    Other Visit Diagnoses    Spinal stenosis of lumbar region without neurogenic claudication       Relevant Medications   lidocaine (LIDODERM) 5 %      Note: This dictation was prepared with Dragon dictation along with smaller phrase technology. Any transcriptional errors that result from this process are unintentional.

## 2019-12-17 ENCOUNTER — Other Ambulatory Visit: Payer: Self-pay | Admitting: *Deleted

## 2019-12-17 LAB — HEMOGLOBIN A1C
Hgb A1c MFr Bld: 5.7 % of total Hgb — ABNORMAL HIGH (ref <5.7)
Mean Plasma Glucose: 117 (calc)
eAG (mmol/L): 6.5 (calc)

## 2019-12-17 LAB — LIPID PANEL
Cholesterol: 182 mg/dL (ref <200)
HDL: 40 mg/dL — ABNORMAL LOW (ref >=50)
LDL Cholesterol (Calc): 117 mg/dL (calc) — ABNORMAL HIGH
Non-HDL Cholesterol (Calc): 142 mg/dL (calc) — ABNORMAL HIGH (ref <130)
Total CHOL/HDL Ratio: 4.6 (calc) (ref <5.0)
Triglycerides: 140 mg/dL (ref <150)

## 2019-12-17 MED ORDER — PREDNISONE 20 MG PO TABS: ORAL_TABLET | ORAL | 0 refills | Status: DC

## 2019-12-17 MED ORDER — FUROSEMIDE 40 MG PO TABS: 40.0000 mg | ORAL_TABLET | Freq: Every day | ORAL | 11 refills | Status: DC

## 2019-12-18 ENCOUNTER — Telehealth: Payer: Self-pay | Admitting: *Deleted

## 2019-12-18 DIAGNOSIS — M48061 Spinal stenosis, lumbar region without neurogenic claudication: Secondary | ICD-10-CM

## 2019-12-18 MED ORDER — LIDOCAINE 5 % EX PTCH: 1.0000 | MEDICATED_PATCH | CUTANEOUS | 0 refills | Status: DC

## 2019-12-18 NOTE — Telephone Encounter (Signed)
[employer] is reviewing your PA request. Typically an electronic response will be received within 72 hours. To check for an update later, open this request from your dashboard.

## 2019-12-18 NOTE — Telephone Encounter (Signed)
Received request from pharmacy for PA on Lidoderm patches.  PA submitted.   Dx: S11.155- spinal stenosis

## 2019-12-18 NOTE — Telephone Encounter (Signed)
Received PA determination.   PA- [id number] approved 12/18/2019- 06/04/2020.

## 2019-12-19 ENCOUNTER — Telehealth: Payer: Self-pay | Admitting: Family Medicine

## 2019-12-19 NOTE — Progress Notes (Signed)
  Chronic Care Management   Outreach Note  12/19/2019 Name: Charlotte Henry MRN: [id number] DOB: [birth year]  Referred by: Alycia Rossetti, MD Reason for referral : No chief complaint on file.   An unsuccessful telephone outreach was attempted today. The patient was referred to the pharmacist for assistance with care management and care coordination.   Follow Up Plan:   [place]

## 2020-01-02 ENCOUNTER — Telehealth: Payer: Medicare HMO | Admitting: Cardiovascular Disease

## 2020-01-06 ENCOUNTER — Encounter: Payer: Self-pay | Admitting: Internal Medicine

## 2020-01-06 ENCOUNTER — Other Ambulatory Visit: Payer: Self-pay

## 2020-01-06 ENCOUNTER — Ambulatory Visit (INDEPENDENT_AMBULATORY_CARE_PROVIDER_SITE_OTHER): Payer: Medicare Other | Admitting: Internal Medicine

## 2020-01-06 VITALS — BP 118/58 | HR 60 | Ht 67.0 in | Wt 141.2 lb

## 2020-01-06 DIAGNOSIS — I739 Peripheral vascular disease, unspecified: Secondary | ICD-10-CM

## 2020-01-06 DIAGNOSIS — Z720 Tobacco use: Secondary | ICD-10-CM

## 2020-01-06 DIAGNOSIS — Z950 Presence of cardiac pacemaker: Secondary | ICD-10-CM

## 2020-01-06 NOTE — Patient Instructions (Signed)
Medication Instructions:  Your physician recommends that you continue on your current medications as directed. Please refer to the Current Medication list given to you today.  *If you need a refill on your cardiac medications before your next appointment, please call your pharmacy*   Lab Work: NONE   If you have labs (blood work) drawn today and your tests are completely normal, you will receive your results only by: Marland Kitchen MyChart Message (if you have MyChart) OR . A paper copy in the mail If you have any lab test that is abnormal or we need to change your treatment, we will call you to review the results.   Testing/Procedures: NONE    Follow-Up: At [facility], you and your health needs are our priority.  As part of our continuing mission to provide you with exceptional heart care, we have created designated Provider Care Teams.  These Care Teams include your primary Cardiologist (physician) and Advanced Practice Providers (APPs -  Physician Assistants and Nurse Practitioners) who all work together to provide you with the care you need, when you need it.  We recommend signing up for the patient portal called "MyChart".  Sign up information is provided on this After Visit Summary.  MyChart is used to connect with patients for Virtual Visits (Telemedicine).  Patients are able to view lab/test results, encounter notes, upcoming appointments, etc.  Non-urgent messages can be sent to your provider as well.   To learn more about what you can do with MyChart, go to [email or web address].    Your next appointment:   1 year(s)  The format for your next appointment:   In Person  Provider:   Cristopher Peru, MD   Other Instructions Thank you for choosing [facility]!  Wear compression stockings  Elevate legs when swollen   Two Gram Sodium Diet 2000 mg  What is Sodium? Sodium is a mineral found naturally in many foods. The most significant source of sodium in the diet is table  salt, which is about 40% sodium.  Processed, convenience, and preserved foods also contain a large amount of sodium.  The body needs only 500 mg of sodium daily to function,  A normal diet provides more than enough sodium even if you do not use salt.  Why Limit Sodium? A build up of sodium in the body can cause thirst, increased blood pressure, shortness of breath, and water retention.  Decreasing sodium in the diet can reduce edema and risk of heart attack or stroke associated with high blood pressure.  Keep in mind that there are many other factors involved in these health problems.  Heredity, obesity, lack of exercise, cigarette smoking, stress and what you eat all play a role.  General Guidelines:  Do not add salt at the table or in cooking.  One teaspoon of salt contains over 2 grams of sodium.  Read food labels  Avoid processed and convenience foods  Ask your dietitian before eating any foods not dicussed in the menu planning guidelines  Consult your physician if you wish to use a salt substitute or a sodium containing medication such as antacids.  Limit milk and milk products to 16 oz (2 cups) per day.  Shopping Hints:  READ LABELS!! "Dietetic" does not necessarily mean low sodium.  Salt and other sodium ingredients are often added to foods during processing.   Menu Planning Guidelines Food Group Choose More Often Avoid  Beverages (see also the milk group All fruit juices, low-sodium, salt-free vegetables juices,  low-sodium carbonated beverages Regular vegetable or tomato juices, commercially softened water used for drinking or cooking  Breads and Cereals Enriched white, wheat, rye and pumpernickel bread, hard rolls and dinner rolls; muffins, cornbread and waffles; most dry cereals, cooked cereal without added salt; unsalted crackers and breadsticks; low sodium or homemade bread crumbs Bread, rolls and crackers with salted tops; quick breads; instant hot cereals; pancakes; commercial  bread stuffing; self-rising flower and biscuit mixes; regular bread crumbs or cracker crumbs  Desserts and Sweets Desserts and sweets mad with mild should be within allowance Instant pudding mixes and cake mixes  Fats Butter or margarine; vegetable oils; unsalted salad dressings, regular salad dressings limited to 1 Tbs; light, sour and heavy cream Regular salad dressings containing bacon fat, bacon bits, and salt pork; snack dips made with instant soup mixes or processed cheese; salted nuts  Fruits Most fresh, frozen and canned fruits Fruits processed with salt or sodium-containing ingredient (some dried fruits are processed with sodium sulfites        Vegetables Fresh, frozen vegetables and low- sodium canned vegetables Regular canned vegetables, sauerkraut, pickled vegetables, and others prepared in brine; frozen vegetables in sauces; vegetables seasoned with ham, bacon or salt pork  Condiments, Sauces, Miscellaneous  Salt substitute with physician's approval; pepper, herbs, spices; vinegar, lemon or lime juice; hot pepper sauce; garlic powder, onion powder, low sodium soy sauce (1 Tbs.); low sodium condiments (ketchup, chili sauce, mustard) in limited amounts (1 tsp.) fresh ground horseradish; unsalted tortilla chips, pretzels, potato chips, popcorn, salsa (1/4 cup) Any seasoning made with salt including garlic salt, celery salt, onion salt, and seasoned salt; sea salt, rock salt, kosher salt; meat tenderizers; monosodium glutamate; mustard, regular soy sauce, barbecue, sauce, chili sauce, teriyaki sauce, steak sauce, Worcestershire sauce, and most flavored vinegars; canned gravy and mixes; regular condiments; salted snack foods, olives, picles, relish, horseradish sauce, catsup   Food preparation: Try these seasonings Meats:    Pork Sage, onion Serve with applesauce  Chicken Poultry seasoning, thyme, parsley Serve with cranberry sauce  Lamb Curry powder, rosemary, garlic, thyme Serve with mint  sauce or jelly  Veal Marjoram, basil Serve with current jelly, cranberry sauce  Beef Pepper, bay leaf Serve with dry mustard, unsalted chive butter  Fish [place] leaf, dill Serve with unsalted lemon butter, unsalted parsley butter  Vegetables:    Asparagus Lemon juice   Broccoli Lemon juice   Carrots Mustard dressing parsley, mint, nutmeg, glazed with unsalted butter and sugar   Green beans Marjoram, lemon juice, nutmeg,dill seed   Tomatoes Basil, marjoram, onion   Spice /blend for [employer]" 4 tsp ground thyme 1 tsp ground sage 3 tsp ground rosemary 4 tsp ground marjoram   Test your knowledge 1. A product that says "Salt Free" may still contain sodium. True or False 2. Garlic Powder and Hot Pepper Sauce an be used as alternative seasonings.True or False 3. Processed foods have more sodium than fresh foods.  True or False 4. Canned Vegetables have less sodium than froze True or False  WAYS TO DECREASE YOUR SODIUM INTAKE 1. Avoid the use of added salt in cooking and at the table.  Table salt (and other prepared seasonings which contain salt) is probably one of the greatest sources of sodium in the diet.  Unsalted foods can gain flavor from the sweet, sour, and butter taste sensations of herbs and spices.  Instead of using salt for seasoning, try the following seasonings with the foods listed.  Remember: how  you use them to enhance natural food flavors is limited only by your creativity... Allspice-Meat, fish, eggs, fruit, peas, red and yellow vegetables Almond Extract-Fruit baked goods Anise Seed-Sweet breads, fruit, carrots, beets, cottage cheese, cookies (tastes like licorice) Basil-Meat, fish, eggs, vegetables, rice, vegetables salads, soups, sauces Bay Leaf-Meat, fish, stews, poultry Burnet-Salad, vegetables (cucumber-like flavor) Caraway Seed-Bread, cookies, cottage cheese, meat, vegetables, cheese, rice Cardamon-Baked goods, fruit, soups Celery Powder or seed-Salads, salad  dressings, sauces, meatloaf, soup, bread.Do not use  celery salt Chervil-Meats, salads, fish, eggs, vegetables, cottage cheese (parsley-like flavor) Chili Power-Meatloaf, chicken cheese, corn, eggplant, egg dishes Chives-Salads cottage cheese, egg dishes, soups, vegetables, sauces Cilantro-Salsa, casseroles Cinnamon-Baked goods, fruit, pork, lamb, chicken, carrots Cloves-Fruit, baked goods, fish, pot roast, green beans, beets, carrots Coriander-Pastry, cookies, meat, salads, cheese (lemon-orange flavor) Cumin-Meatloaf, fish,cheese, eggs, cabbage,fruit pie (caraway flavor) [employer], fruit, eggs, fish, poultry, cottage cheese, vegetables Dill Seed-Meat, cottage cheese, poultry, vegetables, fish, salads, bread Fennel Seed-Bread, cookies, apples, pork, eggs, fish, beets, cabbage, cheese, Licorice-like flavor Garlic-(buds or powder) Salads, meat, poultry, fish, bread, butter, vegetables, potatoes.Do not  use garlic salt Ginger-Fruit, vegetables, baked goods, meat, fish, poultry Horseradish Root-Meet, vegetables, butter Lemon Juice or Extract-Vegetables, fruit, tea, baked goods, fish salads Mace-Baked goods fruit, vegetables, fish, poultry (taste like nutmeg) Maple Extract-Syrups Marjoram-Meat, chicken, fish, vegetables, breads, green salads (taste like Sage) Mint-Tea, lamb, sherbet, vegetables, desserts, carrots, cabbage Mustard, Dry or Seed-Cheese, eggs, meats, vegetables, poultry Nutmeg-Baked goods, fruit, chicken, eggs, vegetables, desserts Onion Powder-Meat, fish, poultry, vegetables, cheese, eggs, bread, rice salads (Do not use   Onion salt) Orange Extract-Desserts, baked goods Oregano-Pasta, eggs, cheese, onions, pork, lamb, fish, chicken, vegetables, green salads Paprika-Meat, fish, poultry, eggs, cheese, vegetables Parsley Flakes-Butter, vegetables, meat fish, poultry, eggs, bread, salads (certain forms may   Contain sodium Pepper-Meat fish, poultry, vegetables,  eggs Peppermint Extract-Desserts, baked goods Poppy Seed-Eggs, bread, cheese, fruit dressings, baked goods, noodles, vegetables, cottage  [employer], poultry, meat, fish, cauliflower, turnips,eggs bread Saffron-Rice, bread, veal, chicken, fish, eggs Sage-Meat, fish, poultry, onions, eggplant, tomateos, pork, stews Savory-Eggs, salads, poultry, meat, rice, vegetables, soups, pork Tarragon-Meat, poultry, fish, eggs, butter, vegetables (licorice-like flavor)  Thyme-Meat, poultry, fish, eggs, vegetables, (clover-like flavor), sauces, soups Tumeric-Salads, butter, eggs, fish, rice, vegetables (saffron-like flavor) Vanilla Extract-Baked goods, candy Vinegar-Salads, vegetables, meat marinades Walnut Extract-baked goods, candy  2. Choose your Foods Wisely   The following is a list of foods to avoid which are high in sodium:  Meats-Avoid all smoked, canned, salt cured, dried and kosher meat and fish as well as Anchovies   [employer] meats:Bologna, Liverwurst, Pastrami Canned meat or fish  Marinated herring Caviar    Pepperoni Corned Beef   Pizza Dried chipped beef  Salami Frozen breaded fish or meat Salt pork Frankfurters or hot dogs  Sardines Gefilte fish   Sausage Ham (boiled ham, Proscuitto Smoked butt    spiced ham)   Spam      TV Dinners Vegetables Canned vegetables (Regular) Relish Canned mushrooms  Sauerkraut Olives    Tomato juice Pickles  Bakery and Dessert Products Canned puddings  Cream pies Cheesecake   Decorated cakes Cookies  Beverages/Juices Tomato juice, regular  Gatorade   V-8 vegetable juice, regular  Breads and Cereals Biscuit mixes   Salted potato chips, corn chips, pretzels Bread stuffing mixes  Salted crackers and rolls Pancake and waffle mixes Self-rising flour  Seasonings Accent    Meat sauces Barbecue sauce  Meat tenderizer Catsup  Monosodium glutamate (MSG) Celery salt   Onion salt Chili  sauce   Prepared mustard Garlic salt   Salt, seasoned salt, sea salt Gravy mixes   Soy sauce Horseradish   Steak sauce Ketchup   Tartar sauce Lite salt    Teriyaki sauce Marinade mixes   Worcestershire sauce  Others Baking powder   Cocoa and cocoa mixes Baking soda   Commercial casserole mixes Candy-caramels, chocolate  Dehydrated soups    Bars, fudge,nougats  Instant rice and pasta mixes Canned broth or soup  Maraschino cherries Cheese, aged and processed cheese and cheese spreads  Learning Assessment Quiz  Indicated T (for True) or F (for False) for each of the following statements:  1. _____ Fresh fruits and vegetables and unprocessed grains are generally low in sodium 2. _____ Water may contain a considerable amount of sodium, depending on the source 3. _____ You can always tell if a food is high in sodium by tasting it 4. _____ Certain laxatives my be high in sodium and should be avoided unless prescribed   by a physician or pharmacist 5. _____ Salt substitutes may be used freely by anyone on a sodium restricted diet 6. _____ Sodium is present in table salt, food additives and as a natural component of   most foods 7. _____ Table salt is approximately 90% sodium 8. _____ Limiting sodium intake may help prevent excess fluid accumulation in the body 9. _____ On a sodium-restricted diet, seasonings such as bouillon soy sauce, and    cooking wine should be used in place of table salt 10. _____ On an ingredient list, a product which lists monosodium glutamate as the first   ingredient is an appropriate food to include on a low sodium diet  Circle the best answer(s) to the following statements (Hint: there may be more than one correct answer)  11. On a low-sodium diet, some acceptable snack items are:    A. Olives  F. Bean dip   K. Grapefruit juice    B. Salted Pretzels G. Commercial Popcorn   L. Canned peaches    C. Carrot Sticks  H. Bouillon   M. Unsalted nuts   D. [place]  fries  I. Peanut butter crackers N. Salami   E. Sweet pickles J. Tomato Juice   O. Pizza  12.  Seasonings that may be used freely on a reduced - sodium diet include   A. Lemon wedges F.Monosodium glutamate K. Celery seed    B.Soysauce   G. Pepper   L. Mustard powder   C. Sea salt  H. Cooking wine  M. Onion flakes   D. Vinegar  E. Prepared horseradish N. Salsa   E. Sage   J. Worcestershire sauce  O. Chutney

## 2020-01-06 NOTE — Progress Notes (Signed)
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 She has been stable in the interim, with no chest pain or shortness of breath.  She denies anginal symptoms. She has not had syncope. She notes peripheral edema. Her skin on her lower legs will turn dark. She is still smoking.  Allergies  Allergen Reactions  . Ace Inhibitors Hives, Shortness Of Breath and Swelling  . Aspirin Other (See Comments)    Blood in stool and nose bleed  . Codeine Swelling  . Pineapple Shortness Of Breath and Swelling  . Plasticized Base [Plastibase] Hives and Swelling    No plastics  . Ultram [Tramadol] Swelling  . Adhesive [Tape] Swelling and Rash  . Naprosyn [Naproxen] Swelling  . Biaxin [Clarithromycin] Nausea And Vomiting and Other (See Comments)    Dizziness.  . Clindamycin/Lincomycin Other (See Comments)    Burning sensation throat, abdomen  . Linzess [Linaclotide]     DIARRHEA, FECAL INCONTINENCE  . Metronidazole     NAUSEA AND WEAKNESS  . Nsaids   . Penicillins     Lip swelling, Hives Has patient had a PCN reaction causing immediate rash, facial/tongue/throat swelling, SOB or lightheadedness with hypotension:YES Has patient had a PCN reaction causing severe rash involving mucus membranes or skin necrosis: NO Has patient had a PCN reaction that required hospitalization NO Has patient had a PCN reaction occurring within the last 10 years: NO If all of the above answers are "NO", then may proceed with Cephalosporin use.  . Varenicline Tartrate Swelling  . Xarelto [Rivaroxaban]     Bleeding     Current Outpatient Medications  Medication Sig Dispense Refill  . albuterol (PROVENTIL HFA;VENTOLIN HFA) 108 (90 Base) MCG/ACT inhaler Inhale 2 puffs into the lungs every 4 (four) hours as needed for wheezing or shortness of breath. 1 Inhaler 2  . ALPRAZolam (XANAX) 0.25 MG tablet TAKE 1 TABLET BY MOUTH AT BEDTIME AS  NEEDED. 30 tablet 0  . calcium citrate-vitamin D (CITRACAL+D) 315-200 MG-UNIT per tablet Take 1 tablet by mouth daily.      . Cyanocobalamin (VITAMIN B 12 PO) Take 1 tablet by mouth daily.     Marland Kitchen donepezil (ARICEPT) 5 MG tablet Take 1 tablet (5 mg total) by mouth at bedtime. 30 tablet 6  . escitalopram (LEXAPRO) 10 MG tablet Take 1 tablet (10 mg total) by mouth daily. 30 tablet 6  . esomeprazole (NEXIUM) 40 MG capsule TAKE 1 CAPSULE BY MOUTH DAILY BEFORE BREAKFAST. 30 capsule 3  . ezetimibe (ZETIA) 10 MG tablet Take 1 tablet (10 mg total) by mouth daily. 90 tablet 2  . ferrous sulfate 325 (65 FE) MG tablet Take 325 mg by mouth daily with breakfast.    . furosemide (LASIX) 40 MG tablet Take 1 tablet (40 mg total) by mouth daily. 30 tablet 11  . gabapentin (NEURONTIN) 100 MG capsule TAKE 1 TO 2 CAPSULES BY MOUTH AT BEDTIME FOR NERVE PAIN. (Patient taking differently: Take 100 mg by mouth at bedtime. ) 180 capsule 2  . lidocaine (LIDODERM) 5 % Place 1 patch onto the skin daily. Remove & Discard patch within 12 hours or as directed by MD 30 patch 0  . Multiple Vitamin (MULTIVITAMIN WITH MINERALS) TABS tablet Take 1 tablet by mouth daily.    . meclizine (ANTIVERT) 25 MG tablet Take 1 tablet (25 mg total) by mouth every 6 (six) hours as needed for dizziness. (Patient not  taking: Reported on 08/14/2019) 30 tablet 0   No current facility-administered medications for this visit.     Past Medical History:  Diagnosis Date  . 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 ASCVD (arteriosclerotic cardiovascular disease)    a. cath in 4/04,-80%LAD; 70% RCA, -> DESx2; residual 60% distal RCA; nl EF  b. Nuc 02/2016 aborted due to bradycardia. Will need to be rescheduled   . Carotid artery occlusion   . Cerebrovascular disease   . CHB (complete heart block) ([place])    a. s/p PPM on 03/24/16 with a St. Jude (serial number [id number]) pacemaker  . Chronic kidney disease (CKD), stage III (moderate)   . Clostridium  difficile colitis 03/2013   a. 03/2013  . COPD (chronic obstructive pulmonary disease) ([place])   . Coronary disease   . Diverticulosis   . GERD (gastroesophageal reflux disease)   . Gout   . H pylori ulcer   . Hyperlipidemia    chose to stop statin, takes Zetia  . Hypertension    not currently on medication  . IBS (irritable bowel syndrome)   . PVD (peripheral vascular disease) ([place])    a. moderate to severely decreased ABI's in 8/08, sig. aortic inflow disease b. repeat ABI's in 2011 improved- mild disease on the left and moderate to severe disease on the right  . S/P placement of cardiac pacemaker    a. 03/24/16: St. Jude (serial number [id number]) pacemaker  . Symptomatic anemia 08/15/2016  . TIA (transient ischemic attack) 2012  . Tobacco abuse    a. 50 pack years continuing at 1/2 pack per day  . Tobacco use disorder 10/15/2015    ROS:   All systems reviewed and negative except as noted in the HPI.   Past Surgical History:  Procedure Laterality Date  . ABDOMINAL HYSTERECTOMY    . carpel tunnel release     bilateral  . CHOLECYSTECTOMY    . COLONOSCOPY  2009  . COLONOSCOPY N/A 08/04/2016   incomplete due to prep.   . COLONOSCOPY N/A 08/05/2016   Dr. Oneida Alar: moderately redundant rectosigmoid and sigmoid, normal TI, single large-mouthed diverticulum in hepatic flexure, external hemorrhoids, path negative for microscopic colitis   . ENTEROSCOPY N/A 08/18/2016   Procedure: ENTEROSCOPY;  Surgeon: Daneil Dolin, MD;  Location: [facility];  Service: Endoscopy;  Laterality: N/A;  Pediatric colonoscope  . EP IMPLANTABLE DEVICE N/A 03/24/2016   Procedure: Pacemaker Implant;  Surgeon: [name], MD;  Location: [facility];  Service: Cardiovascular;  Laterality: N/A;  . ESOPHAGOGASTRODUODENOSCOPY N/A 08/04/2016   Dr. Oneida Alar: non-critical Schatzki's ring, small hiatal hernia, +H.pylori gastritis on path.   Marland Kitchen GIVENS CAPSULE STUDY N/A 08/16/2016   Procedure: GIVENS CAPSULE STUDY;   Surgeon: Daneil Dolin, MD;  Location: [facility];  Service: Endoscopy;  Laterality: N/A;  . TOTAL ABDOMINAL HYSTERECTOMY W/ BILATERAL SALPINGOOPHORECTOMY  1979     Family History  Problem Relation Age of Onset  . Liver disease Mother 48  . Hypertension Mother   . Cerebral aneurysm Father   . Aneurysm Father 59  . Cancer Sister   . Liver disease Brother   . Liver disease Brother   . Liver disease Brother   . Diabetes type II Brother   . Alcohol abuse Brother   . Colon cancer Neg Hx      Social History   Socioeconomic History  . Marital status: Widowed    Spouse name: Not on file  . Number  of children: Not on file  . Years of education: Not on file  . Highest education level: Not on file  Occupational History  . Not on file  Tobacco Use  . Smoking status: Current Every Day Smoker    Packs/day: 1.00    Years: 50.00    Pack years: 50.00    Types: Cigarettes    Start date: 02/02/1966  . Smokeless tobacco: Never Used  . Tobacco comment: 1/2 pack daily  Vaping Use  . Vaping Use: Never used  Substance and Sexual Activity  . Alcohol use: No    Alcohol/week: 0.0 standard drinks    Comment: H/O of 6 pack per day quiting in 2003  . Drug use: No  . Sexual activity: Not Currently  Other Topics Concern  . Not on file  Social History Narrative  . Not on file   Social Determinants of Health   Financial Resource Strain:   . Difficulty of Paying Living Expenses:   Food Insecurity:   . Worried About Charity fundraiser in the Last Year:   . Arboriculturist in the Last Year:   Transportation Needs:   . Film/video editor (Medical):   Marland Kitchen Lack of Transportation (Non-Medical):   Physical Activity:   . Days of Exercise per Week:   . Minutes of Exercise per Session:   Stress:   . Feeling of Stress :   Social Connections:   . Frequency of Communication with Friends and Family:   . Frequency of Social Gatherings with Friends and Family:   . Attends Religious  Services:   . Active Member of Clubs or Organizations:   . Attends Archivist Meetings:   Marland Kitchen Marital Status:   Intimate Partner Violence:   . Fear of Current or Ex-Partner:   . Emotionally Abused:   Marland Kitchen Physically Abused:   . Sexually Abused:      BP (!) 118/58   Pulse 60   Ht 5\' 7"  (1.702 m)   Wt 141 lb 3.2 oz (64 kg)   SpO2 100%   BMI 22.12 kg/m   Physical Exam:  Well appearing NAD HEENT: Unremarkable Neck:  No JVD, no thyromegally Lymphatics:  No adenopathy Back:  No CVA tenderness Lungs:  Clear with no wheezes HEART:  Regular rate rhythm, no murmurs, no rubs, no clicks Abd:  soft, positive bowel sounds, no organomegally, no rebound, no guarding Ext:  2 plus pulses, no edema, no cyanosis, no clubbing Skin:  No rashes no nodules Neuro:  CN II through XII intact, motor grossly intact   DEVICE  Normal device function.  See PaceArt for details.   Assess/Plan: 1. Sinus node dysfunction - she is asymptomatic, s/p PPM insertion. 2. Tobacco abuse - I have continued to encourage her to stop smoking. 3. HTN - her bp is well controlled today. 4. Peripheral edema - she will cut back her salt intake. I have asked her to elevate her legs. She has support stockings and she will try to use them as needed.  Mikle Bosworth.D.

## 2020-01-07 ENCOUNTER — Telehealth: Payer: Self-pay | Admitting: Family Medicine

## 2020-01-07 NOTE — Telephone Encounter (Signed)
Pt called and wanted to speak with Christina. She would not give the reason why she called she just said to call her before you left. (Sorry)

## 2020-01-12 ENCOUNTER — Ambulatory Visit (INDEPENDENT_AMBULATORY_CARE_PROVIDER_SITE_OTHER): Payer: Medicare Other | Admitting: *Deleted

## 2020-01-12 DIAGNOSIS — I442 Atrioventricular block, complete: Secondary | ICD-10-CM

## 2020-01-12 NOTE — Telephone Encounter (Signed)
Call placed to patient. LMTRC.  

## 2020-01-12 NOTE — Telephone Encounter (Signed)
Call placed to patient.   Reports that she was seen by Cards and wanted PCP to be made aware.

## 2020-01-14 LAB — CUP PACEART REMOTE DEVICE CHECK
Battery Remaining Longevity: 108 mo
Battery Remaining Percentage: 95.5 %
Battery Voltage: 2.98 V
Brady Statistic AP VP Percent: 69 %
Brady Statistic AP VS Percent: 1 %
Brady Statistic AS VP Percent: 31 %
Brady Statistic AS VS Percent: 1 %
Brady Statistic RA Percent Paced: 68 %
Brady Statistic RV Percent Paced: 99 %
Date Time Interrogation Session: 20210809020029
Implantable Lead Implant Date: 20171020
Implantable Lead Implant Date: 20171020
Implantable Lead Location: 753859
Implantable Lead Location: 753860
Implantable Pulse Generator Implant Date: 20171020
Lead Channel Impedance Value: 410 Ohm
Lead Channel Impedance Value: 450 Ohm
Lead Channel Pacing Threshold Amplitude: 0.75 V
Lead Channel Pacing Threshold Amplitude: 1 V
Lead Channel Pacing Threshold Pulse Width: 0.4 ms
Lead Channel Pacing Threshold Pulse Width: 0.4 ms
Lead Channel Sensing Intrinsic Amplitude: 1.9 mV
Lead Channel Sensing Intrinsic Amplitude: 9 mV
Lead Channel Setting Pacing Amplitude: 2 V
Lead Channel Setting Pacing Amplitude: 2.5 V
Lead Channel Setting Pacing Pulse Width: 0.4 ms
Lead Channel Setting Sensing Sensitivity: 3.5 mV
Pulse Gen Model: 2272
Pulse Gen Serial Number: 3180497

## 2020-01-15 NOTE — Progress Notes (Signed)
Remote pacemaker transmission.   

## 2020-02-05 ENCOUNTER — Telehealth: Payer: Self-pay | Admitting: Family Medicine

## 2020-02-05 NOTE — Progress Notes (Signed)
°  Chronic Care Management   Note  02/05/2020 Name: Charlotte Henry MRN: [id number] DOB: 1941-05-29  Charlotte Henry is a 79 y.o. year old female who is a primary care patient of [place], Modena Nunnery, MD. I reached out to [name] by phone today in response to a referral sent by [name] PCP, Buelah Manis, Modena Nunnery, MD.   [name] about Chronic Care Management services today including:  1. CCM service includes personalized support from designated clinical staff supervised by her physician, including individualized plan of care and coordination with other care providers 2. 24/7 contact phone numbers for assistance for urgent and routine care needs. 3. Service will only be billed when office clinical staff spend 20 minutes or more in a month to coordinate care. 4. Only one practitioner may furnish and bill the service in a calendar month. 5. The patient may stop CCM services at any time (effective at the end of the month) by phone call to the office staff.   Patient agreed to services and verbal consent obtained.   Follow up plan:   Carley Perdue [employer] Scheduler

## 2020-02-11 ENCOUNTER — Inpatient Hospital Stay (HOSPITAL_COMMUNITY): Admission: RE | Admit: 2020-02-11 | Payer: Medicare HMO | Source: Ambulatory Visit

## 2020-02-11 ENCOUNTER — Encounter: Payer: Medicare HMO | Admitting: Vascular Surgery

## 2020-02-19 ENCOUNTER — Ambulatory Visit (HOSPITAL_COMMUNITY): Payer: Medicare HMO | Admitting: Hematology

## 2020-02-23 ENCOUNTER — Other Ambulatory Visit: Payer: Self-pay | Admitting: Family Medicine

## 2020-02-24 NOTE — Telephone Encounter (Signed)
Requested Prescriptions   Pending Prescriptions Disp Refills  . ALPRAZolam (XANAX) 0.25 MG tablet [Pharmacy Med Name: ALPRAZOLAM 0.25MG  TABLET] 30 tablet 0    Sig: TAKE 1 TABLET BY MOUTH AT BEDTIME AS NEEDED.     Last OV 12/16/2019   Last written 10/28/2019

## 2020-03-01 ENCOUNTER — Inpatient Hospital Stay (HOSPITAL_COMMUNITY): Payer: Medicare Other

## 2020-03-01 ENCOUNTER — Ambulatory Visit (HOSPITAL_COMMUNITY): Payer: Medicare Other

## 2020-03-03 ENCOUNTER — Ambulatory Visit (HOSPITAL_COMMUNITY): Payer: Medicare HMO | Admitting: Hematology

## 2020-03-17 ENCOUNTER — Ambulatory Visit: Payer: Medicare Other

## 2020-03-23 NOTE — Chronic Care Management (AMB) (Signed)
Chronic Care Management Pharmacy  Name: Charlotte Henry  MRN: [id number] DOB: 06/23/1940  Chief Complaint/ HPI  Charlotte Henry,  79 y.o. , female presents for their Initial CCM visit with the clinical pharmacist via telephone.  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  Office Visits: 12/16/2019 [facility]) -   L sided pain, she has not been taking Lasix or wearing compression hose.  She also reports randomly taking Aricept.  She has been instructed to resume lasix daily  Consult Visit:none recent  Medications: Outpatient Encounter Medications as of 03/24/2020  Medication Sig  . albuterol (PROVENTIL HFA;VENTOLIN HFA) 108 (90 Base) MCG/ACT inhaler Inhale 2 puffs into the lungs every 4 (four) hours as needed for wheezing or shortness of breath.  . ALPRAZolam (XANAX) 0.25 MG tablet TAKE 1 TABLET BY MOUTH AT BEDTIME AS NEEDED.  . calcium citrate-vitamin D (CITRACAL+D) 315-200 MG-UNIT per tablet Take 1 tablet by mouth daily.    . Cyanocobalamin (VITAMIN B 12 PO) Take 1 tablet by mouth daily.   Marland Kitchen donepezil (ARICEPT) 5 MG tablet Take 1 tablet (5 mg total) by mouth at bedtime.  Marland Kitchen escitalopram (LEXAPRO) 10 MG tablet Take 1 tablet (10 mg total) by mouth daily.  Marland Kitchen ezetimibe (ZETIA) 10 MG tablet Take 1 tablet (10 mg total) by mouth daily.  . ferrous sulfate 325 (65 FE) MG tablet Take 325 mg by mouth daily with breakfast.  . esomeprazole (NEXIUM) 40 MG capsule TAKE 1 CAPSULE BY MOUTH DAILY BEFORE BREAKFAST. (Patient not taking: Reported on 03/24/2020)  . furosemide (LASIX) 40 MG tablet Take 1 tablet (40 mg total) by mouth daily. (Patient not taking: Reported on 03/24/2020)  . gabapentin (NEURONTIN) 100 MG capsule TAKE 1 TO 2 CAPSULES BY MOUTH AT BEDTIME FOR NERVE PAIN. (Patient not taking: Reported on 03/24/2020)  . lidocaine (LIDODERM) 5 % Place 1 patch onto the skin daily. Remove & Discard patch within 12  hours or as directed by MD (Patient not taking: Reported on 03/24/2020)  . meclizine (ANTIVERT) 25 MG tablet Take 1 tablet (25 mg total) by mouth every 6 (six) hours as needed for dizziness. (Patient not taking: Reported on 08/14/2019)  . Multiple Vitamin (MULTIVITAMIN WITH MINERALS) TABS tablet Take 1 tablet by mouth daily.   No facility-administered encounter medications on file as of 03/24/2020.     Current Diagnosis/Assessment:  Goals Addressed            This Visit's Progress   . Pharmacy Care Plan:       CARE PLAN ENTRY (see longitudinal plan of care for additional care plan information)  Current Barriers:  . Chronic Disease Management support, education, and care coordination needs related to Hypertension, Hyperlipidemia, Diabetes, and COPD   Hypertension BP Readings from Last 3 Encounters:  01/06/20 (!) 118/58  12/16/19 (!) 130/58  12/11/19 130/70   . Pharmacist Clinical Goal(s): o Over the next 180 days, patient will work with PharmD and providers to maintain BP goal <130/80 . Current regimen:  o None . Interventions: o Reviewed in office BP o Reviewed home BP readings . Patient self care activities - Over the next 180 days, patient will: o Check BP periodically, document, and provide at future appointments o Ensure daily salt intake < 2300 mg/day  Hyperlipidemia Lab Results  Component Value Date/Time   LDLCALC 117 (H) 12/16/2019 03:50 PM   . Pharmacist Clinical Goal(s): o Over  the next 180 days, patient will work with PharmD and providers to achieve LDL goal < 70 . Current regimen:  o Zetia 9m . Interventions: o Reviewed most recent lipid panel o Discussed previous statin intolerance o Encouraged dietary modifications . Patient self care activities - Over the next 180 days, patient will: o Continue to take medication as directed o Work on dietary modifications low in saturated fats and processed foods  Diabetes w/ neuropathy Lab Results  Component  Value Date/Time   HGBA1C 5.7 (H) 12/16/2019 03:50 PM   HGBA1C 5.9 (H) 03/24/2019 04:33 PM   . Pharmacist Clinical Goal(s): o Over the next 180 days, patient will work with PharmD and providers to maintain A1c goal < 5.7 . Current regimen:  o No medications at this time . Interventions: o Discussed discontinuation of gabapentin o Reviewed most recent A1c . Patient self care activities - Over the next 180 days, patient will: o Check blood sugar at routine office visits, document, and provide at future appointments o Continue to work on dietary modifications that allow for glucose control   COPD . Pharmacist Clinical Goal(s) o Over the next 180 days, patient will work with PharmD and providers to optimize medication minimize symptoms of COPD. . Current regimen:  o Albuterol HFA 937m as needed . Interventions: o Discussed frequency of inhaler use o Reviewed chart for PFTs . Patient self care activities - Over the next 180 days, patient will: o Continue to use inhalers as needed o Contact PharmD with any worsening SOB episodes   Initial goal documentation        COPD / Asthma / Tobacco    Eosinophil count:   Lab Results  Component Value Date/Time   EOSPCT 2 12/04/2019 02:58 PM  %                               Eos (Absolute):  Lab Results  Component Value Date/Time   EOSABS 0.1 12/04/2019 02:58 PM    Tobacco Status:  Social History   Tobacco Use  Smoking Status Current Every Day Smoker  . Packs/day: 1.00  . Years: 50.00  . Pack years: 50.00  . Types: Cigarettes  . Start date: 02/02/1966  Smokeless Tobacco Never Used  Tobacco Comment   1/2 pack daily    Patient has failed these meds in past: none noted Patient is currently controlled on the following medications:   Albuterol HFA 9079m Using maintenance inhaler regularly? No Frequency of rescue inhaler use:  infrequently  We discussed:    She reports rarely using her inhaler  Currently does not  have a maintenance inhaler  Able to perform ADLs with no issues with SOB  Last PFTs were in 2017  Plan  Continue current medications  Hypertension   BP goal is:  <130/80  Office blood pressures are  BP Readings from Last 3 Encounters:  01/06/20 (!) 118/58  12/16/19 (!) 130/58  12/11/19 130/70   Patient checks BP at home infrequently Patient home BP readings are ranging: 130s/80s per patient  Patient has failed these meds in the past: ACEIs (hives) Patient is currently controlled on the following medications:  . No medications currently  We discussed   Patient checks BP regularly and reports normal readings  She denies dizziness, headaches  Plan  Continue current medications     Diabetes w/ neuropathy and CKD   A1c goal <7%  Recent Relevant Labs: Lab  Results  Component Value Date/Time   HGBA1C 5.7 (H) 12/16/2019 03:50 PM   HGBA1C 5.9 (H) 03/24/2019 04:33 PM   MICROALBUR 0.5 03/24/2019 04:46 PM   MICROALBUR 0.81 09/30/2012 01:52 PM    Kidney Function Lab Results  Component Value Date/Time   CREATININE 0.99 12/04/2019 02:58 PM   CREATININE 1.10 (H) 08/27/2019 02:28 PM   CREATININE 1.23 (H) 03/24/2019 04:33 PM   CREATININE 1.20 (H) 02/11/2019 03:15 PM   GFRNONAA 54 (L) 12/04/2019 02:58 PM   GFRNONAA 49 (L) 10/22/2017 02:04 PM   GFRAA >60 12/04/2019 02:58 PM   GFRAA 57 (L) 10/22/2017 02:04 PM   K 4.3 12/04/2019 02:58 PM   K 4.3 06/03/2019 05:52 PM    Last diabetic Eye exam: No results found for: HMDIABEYEEXA  Last diabetic Foot exam: No results found for: HMDIABFOOTEX   Checking BG: Rarely   Patient has failed these meds in past: none noted Patient is currently controlled on the following medications: . No medications at this time  We discussed:   She reports she is not currently taking her gabapentin, not experiencing nerve pain  She does report leg weakness, but does not attribute that to nerve pain  A1c WNL at last check  Kidney  function is sufficient at this time  Plan  Continue current medications, contact providers with change in symptoms of neuropathy Hyperlipidemia   LDL goal < 70  Lipid Panel     Component Value Date/Time   CHOL 182 12/16/2019 1550   TRIG 140 12/16/2019 1550   TRIG 112 08/11/2008 0000   HDL 40 (L) 12/16/2019 1550   LDLCALC 117 (H) 12/16/2019 1550    Hepatic Function Latest Ref Rng & Units 12/04/2019 06/03/2019 03/24/2019  Total Protein 6.5 - 8.1 g/dL 7.4 7.4 6.5  Albumin 3.5 - 5.0 g/dL 4.3 3.9 -  AST 15 - 41 U/L '20 24 22  ' ALT 0 - 44 U/L '9 12 11  ' Alk Phosphatase 38 - 126 U/L 50 52 -  Total Bilirubin 0.3 - 1.2 mg/dL 0.6 0.7 0.3  Bilirubin, Direct 0.0 - 0.2 mg/dL - - -     The ASCVD Risk score Mikey Bussing DC Jr., et al., 2013) failed to calculate for the following reasons:   The 2013 ASCVD risk score is only valid for ages 67 to 36   Patient has failed these meds in past: none noted Patient is currently uncontrolled on the following medications:  . Zetia 57m  We discussed:    Per other visits, she chose to stop statin on her own  Denies issues with Zetia  LDL currently above goal, however she prefers no statin  Counseled on dietary modifications   Plan  Continue current medications GERD   Patient has failed these meds in past: none noted Patient is currently controlled on the following medications:  . Esomeprazole 439mdaily - she reports no longer taking  We discussed:   Rarely takes her Nexium]  Acid reflux symptoms have been very minimum and under control  Plan  Continue current medications Insomnia   Patient has failed these meds in past: none noted Patient is currently controlled on the following medications:  . No medications at this time  We discussed:   She reported no issues whatsoever with sleep  Goes to sleep about 10:45 and wakes up about 11 am some days with one bathroom break in the middle of the night  Plan  Continue current management  strategies.  Vaccines   Reviewed  and discussed patient's vaccination history.    Immunization History  Administered Date(s) Administered  . Fluad Quad(high Dose 65+) 02/11/2019  . Influenza Split 03/05/2012  . Influenza Whole 04/18/2013  . Influenza, High Dose Seasonal PF 03/27/2018  . Influenza, Seasonal, Injecte, Preservative Fre 03/27/2018  . Influenza,inj,Quad PF,6+ Mos 03/25/2016, 03/08/2017  . Influenza-Unspecified 03/12/2014, 03/17/2015  . Pneumococcal Conjugate-13 05/19/2013    Plan  Recommended patient receive flu vaccine in office.  Medication Management   . Miscellaneous medications:  o Donepezil 6m . OTC's:  o MVI o Citracal + D . Patient currently uses [facility]  She is not currently driving, they are delivering her medications to her currently with no issues. . Patient reports using pill box method to organize medications and promote adherence. . Patientdenies missed doses of medication.   CBeverly Milch PharmD Clinical Pharmacist [place]([phone]

## 2020-03-24 ENCOUNTER — Ambulatory Visit: Payer: Medicare Other

## 2020-03-24 DIAGNOSIS — J410 Simple chronic bronchitis: Secondary | ICD-10-CM

## 2020-03-24 DIAGNOSIS — E1159 Type 2 diabetes mellitus with other circulatory complications: Secondary | ICD-10-CM

## 2020-03-24 DIAGNOSIS — I10 Essential (primary) hypertension: Secondary | ICD-10-CM

## 2020-03-24 DIAGNOSIS — E7800 Pure hypercholesterolemia, unspecified: Secondary | ICD-10-CM

## 2020-03-24 NOTE — Patient Instructions (Addendum)
Visit Information Thank you for meeting with me today!  I look forward to working with you to help you meet all of your healthcare goals and answer any questions you may have.  Feel free to contact me anytime!  Goals Addressed            This Visit's Progress   . Pharmacy Care Plan:       CARE PLAN ENTRY (see longitudinal plan of care for additional care plan information)  Current Barriers:  . Chronic Disease Management support, education, and care coordination needs related to Hypertension, Hyperlipidemia, Diabetes, and COPD   Hypertension BP Readings from Last 3 Encounters:  01/06/20 (!) 118/58  12/16/19 (!) 130/58  12/11/19 130/70   . Pharmacist Clinical Goal(s): o Over the next 180 days, patient will work with PharmD and providers to maintain BP goal <130/80 . Current regimen:  o None . Interventions: o Reviewed in office BP o Reviewed home BP readings . Patient self care activities - Over the next 180 days, patient will: o Check BP periodically, document, and provide at future appointments o Ensure daily salt intake < 2300 mg/day  Hyperlipidemia Lab Results  Component Value Date/Time   LDLCALC 117 (H) 12/16/2019 03:50 PM   . Pharmacist Clinical Goal(s): o Over the next 180 days, patient will work with PharmD and providers to achieve LDL goal < 70 . Current regimen:  o Zetia 10mg  . Interventions: o Reviewed most recent lipid panel o Discussed previous statin intolerance o Encouraged dietary modifications . Patient self care activities - Over the next 180 days, patient will: o Continue to take medication as directed o Work on dietary modifications low in saturated fats and processed foods  Diabetes w/ neuropathy Lab Results  Component Value Date/Time   HGBA1C 5.7 (H) 12/16/2019 03:50 PM   HGBA1C 5.9 (H) 03/24/2019 04:33 PM   . Pharmacist Clinical Goal(s): o Over the next 180 days, patient will work with PharmD and providers to maintain A1c goal <  5.7 . Current regimen:  o No medications at this time . Interventions: o Discussed discontinuation of gabapentin o Reviewed most recent A1c . Patient self care activities - Over the next 180 days, patient will: o Check blood sugar at routine office visits, document, and provide at future appointments o Continue to work on dietary modifications that allow for glucose control   COPD . Pharmacist Clinical Goal(s) o Over the next 180 days, patient will work with PharmD and providers to optimize medication minimize symptoms of COPD. . Current regimen:  o Albuterol HFA 30mcg as needed . Interventions: o Discussed frequency of inhaler use o Reviewed chart for PFTs . Patient self care activities - Over the next 180 days, patient will: o Continue to use inhalers as needed o Contact PharmD with any worsening SOB episodes   Initial goal documentation        Charlotte Henry was given information about Chronic Care Management services today including:  1. CCM service includes personalized support from designated clinical staff supervised by her physician, including individualized plan of care and coordination with other care providers 2. 24/7 contact phone numbers for assistance for urgent and routine care needs. 3. Standard insurance, coinsurance, copays and deductibles apply for chronic care management only during months in which we provide at least 20 minutes of these services. Most insurances cover these services at 100%, however patients may be responsible for any copay, coinsurance and/or deductible if applicable. This service may help you avoid the  need for more expensive face-to-face services. 4. Only one practitioner may furnish and bill the service in a calendar month. 5. The patient may stop CCM services at any time (effective at the end of the month) by phone call to the office staff.  Patient agreed to services and verbal consent obtained.   The patient verbalized understanding of  instructions provided today and agreed to receive a mailed copy of patient instruction and/or educational materials. Telephone follow up appointment with pharmacy team member scheduled for: 65 months  Noreen Mackintosh, PharmD Clinical Pharmacist [facility] [phone]   Familial Hypercholesterolemia Familial hypercholesterolemia (FH) is a genetic disorder that causes a very high level of LDL (low-density lipoprotein) cholesterol. Cholesterol is a waxy, fat-like substance that your body needs to build cells. Your body makes all the cholesterol it needs in the liver and removes extra (excess) cholesterol from the blood as needed. Excess cholesterol comes from food that you eat. In people who have FH, the body is not able to remove LDL cholesterol from the blood as it should. A high level of LDL cholesterol puts you at higher risk for narrowing and hardening of your arteries (atherosclerosis) at an early age. This raises your risk for heart disease and stroke. What are the causes? FH is passed from parent to child (inherited). FH is caused by an inherited gene defect (genetic mutation) that makes it hard for the liver to remove LDL cholesterol from the blood. The gene may be inherited from one parent or both parents. What increases the risk? You may be at higher risk for FH if:  You have a family history of the condition. If both parents carry the genetic mutation, their children are at higher risk for a more severe form of FH, with symptoms that start at an earlier age. What are the signs or symptoms? You may have a high level of LDL cholesterol before you develop symptoms. Symptoms of FH may include:  Cholesterol nodules (xanthomas) on the cords of tissue that connect muscles to bones (tendons). Xanthomas often form on the long tendon at the back of the ankle (Achilles tendon) or on the tendons on the back of the hands.  Cholesterol deposits (xanthelasmas) under the skin of  the eyelids.  A gray or blue ring around the white part of the eye (corneal arcus). Complications of FH can occur due to atherosclerosis. Atherosclerosis may cause damage to an area of the body that is not getting enough blood. Complications of FH may include:  Chest pain (angina) and shortness of breath due to narrowed or blocked arteries in the heart (coronary artery disease).  Pain and cramping in the back of the lower legs (calves) when walking (claudication).  Interruption in blood flow to the brain (stroke). This may cause: ? Loss of balance. ? Vision loss. ? Sudden weakness or numbness on one side of the body. How is this diagnosed? This condition may be diagnosed based on:  Your symptoms.  Your medical history, including any family history of FH or early coronary heart disease.  A physical exam.  A blood test to check for the genetic mutation that causes FH. Your family members may also be tested. How is this treated? There is no cure for FH, but treatment can lower LDL cholesterol levels and lower your risk for heart attack or stroke. Treatment should be started as soon as you are diagnosed. Treatment may include:  A type of medicine that lowers your cholesterol (statin).  If statins do not help, your health care provider may try other kinds of cholesterol-lowering medicines. The exact combination of medicines depends on the severity of your symptoms.  A procedure to filter LDL from your blood (apheresis). You may need this treatment if you have a severe form of FH.  Making lifestyle changes that are healthy for your heart, such as lowering the amount of fat and cholesterol in your diet. Follow these instructions at home: Lifestyle   Lose weight, if directed by your health care provider.  Follow instructions from your health care provider about eating a healthy diet. Your health care provider may recommend: ? Working with a diet and nutrition specialist (dietitian), who  can help you make a healthy eating plan and help you maintain a healthy weight. ? Eating less fat and cholesterol. Avoid fatty meats, fried foods, and whole-fat dairy. ? Eating more vegetables, fruits, and whole grains. ? Limiting your intake of alcohol.  Be physically active. Ask your health care provider what type of exercise is best for you.  Do not use any products that contain nicotine or tobacco, such as cigarettes and e-cigarettes. If you need help quitting, ask your health care provider.  Work with your health care provider to manage any other conditions you have, such as high blood pressure (hypertension) or diabetes. These conditions affect your heart. General instructions  Take over-the-counter and prescription medicines only as told by your health care provider.  Keep all follow-up visits as told by your health care provider. This is important. Contact a health care provider if:  You have pain or cramps in your calf when you walk. Get help right away if:   You have sudden, unexplained discomfort in your chest, arms, back, neck, jaw, or upper body.  You have trouble breathing.  You have a sudden, severe headache with no known cause.  You have any symptoms of a stroke. "BE FAST" is an easy way to remember the main warning signs of a stroke: ? B - Balance. Signs are dizziness, sudden trouble walking, or loss of balance. ? E - Eyes. Signs are trouble seeing or a sudden change in vision. ? F - Face. Signs are sudden weakness or numbness of the face, or the face or eyelid drooping on one side. ? A - Arms. Signs are weakness or numbness in an arm. This happens suddenly and usually on one side of the body. ? S - Speech. Signs are sudden trouble speaking, slurred speech, or trouble understanding what people say. ? T - Time. Time to call emergency services. Write down what time symptoms started. These symptoms may represent a serious problem that is an emergency. Do not wait to  see if the symptoms will go away. Get medical help right away. Call your local emergency services (911 in the [place] Do not drive yourself to the hospital. Summary  Familial hypercholesterolemia (FH) is a genetic disorder that causes a very high level of LDL (low-density lipoprotein) cholesterol.  FH increases your risk for coronary heart disease and stroke at an early age.  Treatment for FH should be started as soon as you are diagnosed with the condition. Treatment is aimed at lowering your risk for complications.  Follow instructions from your health care provider about eating a healthy diet. Your health care provider may recommend eating less fat and cholesterol. This information is not intended to replace advice given to you by your health care provider. Make sure you discuss any questions you have  with your health care provider. Document Revised: 05/04/2017 Document Reviewed: 11/02/2016 Elsevier Patient Education  [place].

## 2020-03-25 ENCOUNTER — Ambulatory Visit: Payer: Medicare Other

## 2020-03-31 ENCOUNTER — Inpatient Hospital Stay (HOSPITAL_COMMUNITY): Payer: Medicare Other | Attending: Hematology

## 2020-03-31 ENCOUNTER — Other Ambulatory Visit: Payer: Self-pay

## 2020-03-31 DIAGNOSIS — C3411 Malignant neoplasm of upper lobe, right bronchus or lung: Secondary | ICD-10-CM

## 2020-03-31 DIAGNOSIS — D649 Anemia, unspecified: Secondary | ICD-10-CM | POA: Insufficient documentation

## 2020-03-31 LAB — CBC WITH DIFFERENTIAL/PLATELET
Abs Immature Granulocytes: 0.01 10*3/uL (ref 0.00–0.07)
Basophils Absolute: 0 10*3/uL (ref 0.0–0.1)
Basophils Relative: 1 %
Eosinophils Absolute: 0.2 10*3/uL (ref 0.0–0.5)
Eosinophils Relative: 3 %
HCT: 30.1 % — ABNORMAL LOW (ref 36.0–46.0)
Hemoglobin: 10 g/dL — ABNORMAL LOW (ref 12.0–15.0)
Immature Granulocytes: 0 %
Lymphocytes Relative: 45 %
Lymphs Abs: 2.7 10*3/uL (ref 0.7–4.0)
MCH: 30 pg (ref 26.0–34.0)
MCHC: 33.2 g/dL (ref 30.0–36.0)
MCV: 90.4 fL (ref 80.0–100.0)
Monocytes Absolute: 0.5 10*3/uL (ref 0.1–1.0)
Monocytes Relative: 8 %
Neutro Abs: 2.5 10*3/uL (ref 1.7–7.7)
Neutrophils Relative %: 43 %
Platelets: 202 10*3/uL (ref 150–400)
RBC: 3.33 MIL/uL — ABNORMAL LOW (ref 3.87–5.11)
RDW: 14 % (ref 11.5–15.5)
WBC: 5.8 10*3/uL (ref 4.0–10.5)
nRBC: 0 % (ref 0.0–0.2)

## 2020-03-31 LAB — COMPREHENSIVE METABOLIC PANEL
ALT: 13 U/L (ref 0–44)
AST: 22 U/L (ref 15–41)
Albumin: 3.9 g/dL (ref 3.5–5.0)
Alkaline Phosphatase: 49 U/L (ref 38–126)
Anion gap: 7 (ref 5–15)
BUN: 22 mg/dL (ref 8–23)
CO2: 24 mmol/L (ref 22–32)
Calcium: 9.3 mg/dL (ref 8.9–10.3)
Chloride: 106 mmol/L (ref 98–111)
Creatinine, Ser: 1.1 mg/dL — ABNORMAL HIGH (ref 0.44–1.00)
GFR, Estimated: 51 mL/min — ABNORMAL LOW (ref >60)
Glucose, Bld: 95 mg/dL (ref 70–99)
Potassium: 4.5 mmol/L (ref 3.5–5.1)
Sodium: 137 mmol/L (ref 135–145)
Total Bilirubin: 0.4 mg/dL (ref 0.3–1.2)
Total Protein: 7.3 g/dL (ref 6.5–8.1)

## 2020-03-31 LAB — IRON AND TIBC
Iron: 49 ug/dL (ref 28–170)
Saturation Ratios: 23 % (ref 10.4–31.8)
TIBC: 215 ug/dL — ABNORMAL LOW (ref 250–450)
UIBC: 166 ug/dL

## 2020-03-31 LAB — FOLATE: Folate: 16.3 ng/mL (ref >5.9)

## 2020-03-31 LAB — FERRITIN: Ferritin: 387 ng/mL — ABNORMAL HIGH (ref 11–307)

## 2020-03-31 LAB — VITAMIN B12: Vitamin B-12: 2819 pg/mL — ABNORMAL HIGH (ref 180–914)

## 2020-04-01 ENCOUNTER — Ambulatory Visit: Payer: Medicare Other

## 2020-04-06 ENCOUNTER — Other Ambulatory Visit: Payer: Self-pay | Admitting: *Deleted

## 2020-04-06 DIAGNOSIS — E7800 Pure hypercholesterolemia, unspecified: Secondary | ICD-10-CM

## 2020-04-06 DIAGNOSIS — I10 Essential (primary) hypertension: Secondary | ICD-10-CM

## 2020-04-06 DIAGNOSIS — M48061 Spinal stenosis, lumbar region without neurogenic claudication: Secondary | ICD-10-CM

## 2020-04-06 DIAGNOSIS — E1159 Type 2 diabetes mellitus with other circulatory complications: Secondary | ICD-10-CM

## 2020-04-06 DIAGNOSIS — I442 Atrioventricular block, complete: Secondary | ICD-10-CM

## 2020-04-06 DIAGNOSIS — D649 Anemia, unspecified: Secondary | ICD-10-CM

## 2020-04-06 DIAGNOSIS — R609 Edema, unspecified: Secondary | ICD-10-CM

## 2020-04-06 DIAGNOSIS — N1831 Chronic kidney disease, stage 3a: Secondary | ICD-10-CM

## 2020-04-06 DIAGNOSIS — M19012 Primary osteoarthritis, left shoulder: Secondary | ICD-10-CM

## 2020-04-06 DIAGNOSIS — E1140 Type 2 diabetes mellitus with diabetic neuropathy, unspecified: Secondary | ICD-10-CM

## 2020-04-06 DIAGNOSIS — E46 Unspecified protein-calorie malnutrition: Secondary | ICD-10-CM

## 2020-04-06 DIAGNOSIS — R2681 Unsteadiness on feet: Secondary | ICD-10-CM

## 2020-04-07 ENCOUNTER — Inpatient Hospital Stay (HOSPITAL_COMMUNITY): Payer: Medicare Other | Admitting: Hematology

## 2020-04-12 ENCOUNTER — Ambulatory Visit (INDEPENDENT_AMBULATORY_CARE_PROVIDER_SITE_OTHER): Payer: Medicare Other

## 2020-04-12 DIAGNOSIS — I442 Atrioventricular block, complete: Secondary | ICD-10-CM | POA: Diagnosis not present

## 2020-04-14 LAB — CUP PACEART REMOTE DEVICE CHECK
Battery Remaining Longevity: 108 mo
Battery Remaining Percentage: 95.5 %
Battery Voltage: 2.98 V
Brady Statistic AP VP Percent: 68 %
Brady Statistic AP VS Percent: 1 %
Brady Statistic AS VP Percent: 32 %
Brady Statistic AS VS Percent: 1 %
Brady Statistic RA Percent Paced: 66 %
Brady Statistic RV Percent Paced: 99 %
Date Time Interrogation Session: 20211108033824
Implantable Lead Implant Date: 20171020
Implantable Lead Implant Date: 20171020
Implantable Lead Location: 753859
Implantable Lead Location: 753860
Implantable Pulse Generator Implant Date: 20171020
Lead Channel Impedance Value: 410 Ohm
Lead Channel Impedance Value: 440 Ohm
Lead Channel Pacing Threshold Amplitude: 0.75 V
Lead Channel Pacing Threshold Amplitude: 1 V
Lead Channel Pacing Threshold Pulse Width: 0.4 ms
Lead Channel Pacing Threshold Pulse Width: 0.4 ms
Lead Channel Sensing Intrinsic Amplitude: 1.8 mV
Lead Channel Sensing Intrinsic Amplitude: 9 mV
Lead Channel Setting Pacing Amplitude: 2 V
Lead Channel Setting Pacing Amplitude: 2.5 V
Lead Channel Setting Pacing Pulse Width: 0.4 ms
Lead Channel Setting Sensing Sensitivity: 3.5 mV
Pulse Gen Model: 2272
Pulse Gen Serial Number: 3180497

## 2020-04-14 NOTE — Progress Notes (Signed)
Remote pacemaker transmission.   

## 2020-04-21 ENCOUNTER — Inpatient Hospital Stay (HOSPITAL_COMMUNITY): Admission: RE | Admit: 2020-04-21 | Payer: Medicare Other | Source: Ambulatory Visit

## 2020-04-21 ENCOUNTER — Encounter: Payer: Medicare Other | Admitting: Vascular Surgery

## 2020-04-26 ENCOUNTER — Telehealth: Payer: Self-pay | Admitting: *Deleted

## 2020-04-26 NOTE — Telephone Encounter (Signed)
Script written

## 2020-04-26 NOTE — Telephone Encounter (Signed)
Received call from patient.   Requested order for standard rolling walker to be sent to [facility].   MD please advise.

## 2020-04-27 ENCOUNTER — Other Ambulatory Visit (HOSPITAL_COMMUNITY): Payer: Self-pay | Admitting: Surgery

## 2020-04-27 ENCOUNTER — Inpatient Hospital Stay (HOSPITAL_COMMUNITY): Payer: Medicare Other | Attending: Hematology | Admitting: Hematology

## 2020-04-27 ENCOUNTER — Other Ambulatory Visit: Payer: Self-pay

## 2020-04-27 DIAGNOSIS — C3411 Malignant neoplasm of upper lobe, right bronchus or lung: Secondary | ICD-10-CM | POA: Diagnosis not present

## 2020-04-27 DIAGNOSIS — D649 Anemia, unspecified: Secondary | ICD-10-CM

## 2020-04-27 DIAGNOSIS — D508 Other iron deficiency anemias: Secondary | ICD-10-CM

## 2020-04-27 MED ORDER — ESOMEPRAZOLE MAGNESIUM 40 MG PO CPDR: DELAYED_RELEASE_CAPSULE | ORAL | 3 refills | Status: DC

## 2020-04-27 NOTE — Telephone Encounter (Signed)
Faxed to [facility].

## 2020-04-27 NOTE — Progress Notes (Signed)
Virtual Visit via Telephone Note  I connected with Charlotte Henry on 04/27/20 at  3:30 PM EST by telephone and verified that I am speaking with the correct person using two identifiers.  Location: Patient: At home Provider: In office   I discussed the limitations, risks, security and privacy concerns of performing an evaluation and management service by telephone and the availability of in person appointments. I also discussed with the patient that there may be a patient responsible charge related to this service. The patient expressed understanding and agreed to proceed.   History of Present Illness: She is seen in our clinic for iron deficiency anemia and follow-up of stage I right lung cancer.   Observations/Objective: She reports severe fatigue and lack of energy.  Denies any bleeding per rectum or melena.  Appetite is reported as 100%.  Denies any chest pains or hemoptysis.  Denies any fevers, night sweats or weight loss.  Assessment and Plan:  1.  Normocytic anemia: -Colonoscopy in March 2018 showing 25 polyps less than 10 mm, redundant left colon, diverticulosis of the hepatic flexure Right.  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 -Last Feraheme infusion was in July 2018. -Reviewed labs from 03/31/2020 which showed ferritin of 387, percent saturation of 23.  H21 and folic acid was normal.  Hemoglobin decreased to 10. -She complains of severe fatigue.  I have recommended 1 infusion of Feraheme to see if this helps with energy levels. -RTC 6 months with labs.  2.  Stage I right lung cancer: -Treated with SBRT, 54 grain 3 fractions in August 2019. -Last CT scan in March 2021 showed suspected radiation changes in the medial right upper lobe with no evidence of recurrence or metastatic disease. -Recommend CT scan of the chest in 6 months.   Follow Up  Instructions: RTC 6 months with labs and scan.   I discussed the assessment and treatment plan with the patient. The patient was provided an opportunity to ask questions and all were answered. The patient agreed with the plan and demonstrated an understanding of the instructions.   The patient was advised to call back or seek an in-person evaluation if the symptoms worsen or if the condition fails to improve as anticipated.  I provided 11 minutes of non-face-to-face time during this encounter.   Derek Jack, MD

## 2020-04-28 DIAGNOSIS — D509 Iron deficiency anemia, unspecified: Secondary | ICD-10-CM | POA: Insufficient documentation

## 2020-04-28 DIAGNOSIS — G629 Polyneuropathy, unspecified: Secondary | ICD-10-CM | POA: Diagnosis not present

## 2020-04-28 DIAGNOSIS — R531 Weakness: Secondary | ICD-10-CM | POA: Diagnosis not present

## 2020-04-28 DIAGNOSIS — M1990 Unspecified osteoarthritis, unspecified site: Secondary | ICD-10-CM | POA: Diagnosis not present

## 2020-05-04 ENCOUNTER — Inpatient Hospital Stay (HOSPITAL_COMMUNITY): Payer: Medicare Other

## 2020-05-04 ENCOUNTER — Other Ambulatory Visit (HOSPITAL_COMMUNITY): Payer: Self-pay

## 2020-05-04 DIAGNOSIS — D509 Iron deficiency anemia, unspecified: Secondary | ICD-10-CM

## 2020-05-10 ENCOUNTER — Telehealth: Payer: Self-pay | Admitting: Family Medicine

## 2020-05-10 NOTE — Telephone Encounter (Signed)
Call placed to patient daughter Anderson Malta [phone] VM full.

## 2020-05-10 NOTE — Telephone Encounter (Signed)
Anderson Malta pt daughter stop by to pick up some ensure would like for the Margreta Journey to give her call back concerning her mother

## 2020-05-11 NOTE — Telephone Encounter (Signed)
Received return call from patient daughter Anderson Malta.   Reports that patient is having increased weakness and appears to have increased malnutrition. Reports that patient is agreeable to placement in facility at this time.   Advised that both patient and daughter will need to be seen in office to discuss with PCP.   Appointment scheduled. Given 51minutes.

## 2020-05-11 NOTE — Telephone Encounter (Signed)
Noted  

## 2020-05-12 ENCOUNTER — Inpatient Hospital Stay (HOSPITAL_COMMUNITY): Payer: Medicare Other

## 2020-05-19 ENCOUNTER — Ambulatory Visit (INDEPENDENT_AMBULATORY_CARE_PROVIDER_SITE_OTHER): Payer: Medicare Other | Admitting: Family Medicine

## 2020-05-19 ENCOUNTER — Other Ambulatory Visit: Payer: Self-pay

## 2020-05-19 ENCOUNTER — Inpatient Hospital Stay (HOSPITAL_COMMUNITY): Payer: Medicare Other | Attending: Hematology

## 2020-05-19 ENCOUNTER — Encounter (HOSPITAL_COMMUNITY): Payer: Self-pay

## 2020-05-19 ENCOUNTER — Encounter: Payer: Self-pay | Admitting: Family Medicine

## 2020-05-19 VITALS — BP 127/58 | HR 62 | Temp 96.9°F | Resp 16

## 2020-05-19 VITALS — BP 138/70 | HR 98 | Temp 98.1°F | Resp 18 | Ht 67.0 in | Wt 143.0 lb

## 2020-05-19 DIAGNOSIS — R0781 Pleurodynia: Secondary | ICD-10-CM

## 2020-05-19 DIAGNOSIS — D649 Anemia, unspecified: Secondary | ICD-10-CM

## 2020-05-19 DIAGNOSIS — I739 Peripheral vascular disease, unspecified: Secondary | ICD-10-CM

## 2020-05-19 DIAGNOSIS — E440 Moderate protein-calorie malnutrition: Secondary | ICD-10-CM

## 2020-05-19 DIAGNOSIS — L304 Erythema intertrigo: Secondary | ICD-10-CM

## 2020-05-19 DIAGNOSIS — M79601 Pain in right arm: Secondary | ICD-10-CM | POA: Diagnosis not present

## 2020-05-19 DIAGNOSIS — W19XXXA Unspecified fall, initial encounter: Secondary | ICD-10-CM | POA: Diagnosis not present

## 2020-05-19 DIAGNOSIS — D509 Iron deficiency anemia, unspecified: Secondary | ICD-10-CM | POA: Diagnosis not present

## 2020-05-19 DIAGNOSIS — N1831 Chronic kidney disease, stage 3a: Secondary | ICD-10-CM

## 2020-05-19 MED ORDER — CEPHALEXIN 500 MG PO CAPS: ORAL_CAPSULE | ORAL | 0 refills | Status: DC

## 2020-05-19 MED ORDER — ACETAMINOPHEN 325 MG PO TABS
650.0000 mg | ORAL_TABLET | Freq: Once | ORAL | Status: AC
  Administered 2020-05-19: 14:00:00 650 mg via ORAL

## 2020-05-19 MED ORDER — SODIUM CHLORIDE 0.9 % IV SOLN: Freq: Once | INTRAVENOUS | Status: AC

## 2020-05-19 MED ORDER — CLOTRIMAZOLE 1 % EX CREA: 1.0000 "application " | TOPICAL_CREAM | Freq: Two times a day (BID) | CUTANEOUS | 1 refills | Status: DC

## 2020-05-19 MED ORDER — FUROSEMIDE 40 MG PO TABS
40.0000 mg | ORAL_TABLET | Freq: Every day | ORAL | 11 refills | Status: DC
Start: 2020-05-19 — End: 2020-07-29

## 2020-05-19 MED ORDER — SODIUM CHLORIDE 0.9 % IV SOLN
510.0000 mg | Freq: Once | INTRAVENOUS | Status: AC
  Administered 2020-05-19: 15:00:00 510 mg via INTRAVENOUS
  Filled 2020-05-19: qty 510

## 2020-05-19 MED ORDER — LORATADINE 10 MG PO TABS
10.0000 mg | ORAL_TABLET | Freq: Once | ORAL | Status: AC
  Administered 2020-05-19: 14:00:00 10 mg via ORAL

## 2020-05-19 MED ORDER — NYSTATIN 100000 UNIT/GM EX POWD: 1.0000 "application " | Freq: Three times a day (TID) | CUTANEOUS | 1 refills | Status: DC

## 2020-05-19 MED ORDER — FAMOTIDINE 20 MG PO TABS
20.0000 mg | ORAL_TABLET | Freq: Once | ORAL | Status: AC
  Administered 2020-05-19: 14:00:00 20 mg via ORAL

## 2020-05-19 NOTE — Progress Notes (Signed)
Subjective:    Patient ID: Charlotte Henry, female    DOB: December 31, 1940, 79 y.o.   MRN: [id number]  Patient presents for Other ( Discoloration to BLE/ toes- vascular), Abd Wound (X1 week- open area to abdomen), and S/P Fall (X1 week- fall on R side, pain in R arm)  Patient here with her daughter.  When he initially called in stating that she did not want to go to a nursing facility now states that she does not want to do so.  This is been an ongoing conversation for greater than a year.  She is concerned about arm pain and rib pain since she had a fall on Monday.  She states that she was getting up from her chair to the bed about 1 AM or so when she slipped and fell.  States she did not notice any bruising but her family has noticed that she is not moving her right arm like she typically does.  She does admit that she has significant discomfort and prefers to keep it by her side.  They are also concerned about a possible wound on her lower abdomen that noted some drainage to her clothing and she states there is some discomfort there.  She has not used anything topically but washes the skin daily.  She has chronic peripheral vascular disease and intermittent episodes of edema.  She has not been taking her Lasix.  She has appointment to see vascular in a few weeks.  She has not been using her compression hose either.   Review Of Systems:  GEN- denies fatigue, fever, weight loss,weakness, recent illness HEENT- denies eye drainage, change in vision, nasal discharge, CVS- denies chest pain, palpitations RESP- denies SOB, cough, wheeze ABD- denies N/V, change in stools, abd pain GU- denies dysuria, hematuria, dribbling, incontinence MSK- +joint pain, muscle aches, injury Neuro- denies headache, dizziness, syncope, seizure activity       Objective:    BP 138/70   Pulse 98   Temp 98.1 F (36.7 C) (Temporal)   Resp 18   Ht 5\' 7"  (1.702 m)   Wt 143 lb (64.9 kg)   SpO2 98%   BMI 22.40 kg/m   GEN- NAD, alert and oriented x3,sitting in wheelchair, able to stand only minute with assistance HEENT- PERRL, EOMI, non injected sclera, pink conjunctiva, MMM, oropharynx clear Neck- Supple,fair ROM neck   CVS- RRR, no murmur RESP-CTAB ABD-NABS,soft,NT,ND MSK- decreased ROM RUE, TTP mid humerus, TTP near [facility] right shoulder, TTP Right loser ribs, holds right arm gingerly to side, fair ROM LUE Skin- erythema with maceration beneath pannus L worse than R, cracks in skin, +odor left side EXT-  1+ pitting edema, venous stasis changes, darkened skin over feet, shins, no erythema  Skin in tact  Pulses- Radial 2+ , DP-diminished       Assessment & Plan:      Problem List Items Addressed This Visit      Unprioritized   Chronic kidney disease (CKD), stage III (moderate) ([place])    Reviewed her most recent labs by hematology creatinine at baseline.      Peripheral vascular disease ([place])    She has known peripheral vascular disease.  We discussed this at the last visit in July but she is still not using the Lasix.  I sent this to her pharmacy again.  She has significant edema.  Also given her another prescription for compression hose  When she is sitting she needs to elevate the feet as  much as she can.  She has appointment to see vascular on January 5.  Status post fall on my concern for fracture in her right upper extremity will obtain stat imaging today after she has her iron infusion by hematology.  She has intertrigo which is mostly candidiasis beneath the pannus.  However she does have significant odor with cracks in the skin concerning for a bacterial superinfection on the left side.  I will give her Keflex 500 mg twice daily for 5 days.  She has been to use nystatin powder and Lotrisone to clear up the rest of it.  She states that she gets areas underneath her breasts at times advised that she can use the nystatin powder there.  She does require significant assistance but she refuses to  have anyone come within the home she does not want to live with her children she is often in office with him.  We discussed assisted living skilled nursing facility and she has not agreed on any of these.      Relevant Medications   furosemide (LASIX) 40 MG tablet   Protein-calorie malnutrition ([place])    She has been maintaining her weight.  She does rely on Ensure a lot but states that her appetite is okay       Other Visit Diagnoses    Rib pain on right side    -  Primary   Relevant Orders   DG Ribs Unilateral Right   DG Humerus Right   DG Shoulder Right   Right arm pain       Relevant Orders   DG Ribs Unilateral Right   DG Humerus Right   DG Shoulder Right   Fall, initial encounter       Relevant Orders   DG Ribs Unilateral Right   DG Humerus Right   DG Shoulder Right   Intertrigo          Note: This dictation was prepared with Dragon dictation along with smaller phrase technology. Any transcriptional errors that result from this process are unintentional.

## 2020-05-19 NOTE — Assessment & Plan Note (Signed)
She has been maintaining her weight.  She does rely on Ensure a lot but states that her appetite is okay

## 2020-05-19 NOTE — Patient Instructions (Addendum)
Restart lasix 40mg  once a day  Use cream twice a day and powder beneath folds and breast Take antibiotics for 5 days  Get the xray of your arm and ribs  F/U Feb for recheck

## 2020-05-19 NOTE — Assessment & Plan Note (Signed)
She has known peripheral vascular disease.  We discussed this at the last visit in July but she is still not using the Lasix.  I sent this to her pharmacy again.  She has significant edema.  Also given her another prescription for compression hose  When she is sitting she needs to elevate the feet as much as she can.  She has appointment to see vascular on January 5.  Status post fall on my concern for fracture in her right upper extremity will obtain stat imaging today after she has her iron infusion by hematology.  She has intertrigo which is mostly candidiasis beneath the pannus.  However she does have significant odor with cracks in the skin concerning for a bacterial superinfection on the left side.  I will give her Keflex 500 mg twice daily for 5 days.  She has been to use nystatin powder and Lotrisone to clear up the rest of it.  She states that she gets areas underneath her breasts at times advised that she can use the nystatin powder there.  She does require significant assistance but she refuses to have anyone come within the home she does not want to live with her children she is often in office with him.  We discussed assisted living skilled nursing facility and she has not agreed on any of these.

## 2020-05-19 NOTE — Patient Instructions (Signed)
[facility]  Discharge Instructions:   _______________________________________________________________  Thank you for choosing [facility] to provide your oncology and hematology care.  To afford each patient quality time with our providers, please arrive at least 15 minutes before your scheduled appointment.  You need to re-schedule your appointment if you arrive 10 or more minutes late.  We strive to give you quality time with our providers, and arriving late affects you and other patients whose appointments are after yours.  Also, if you no show three or more times for appointments you may be dismissed from the clinic.  Again, thank you for choosing [facility]. Our hope is that these requests will allow you access to exceptional care and in a timely manner. _______________________________________________________________  If you have questions after your visit, please contact our office at [phone] between the hours of 8:30 a.m. and 5:00 p.m. Voicemails left after 4:30 p.m. will not be returned until the following business day. _______________________________________________________________  For prescription refill requests, have your pharmacy contact our office. _______________________________________________________________  Recommendations made by the consultant and any test results will be sent to your referring physician. _______________________________________________________________ 

## 2020-05-19 NOTE — Progress Notes (Signed)
Tolerated iron infusion well today.  Vital signs stable prior to discharge.  Discharged in stable condition via wheelchair.

## 2020-05-19 NOTE — Assessment & Plan Note (Signed)
Reviewed her most recent labs by hematology creatinine at baseline.

## 2020-05-20 ENCOUNTER — Ambulatory Visit (HOSPITAL_COMMUNITY)
Admission: RE | Admit: 2020-05-20 | Discharge: 2020-05-20 | Disposition: A | Discharge status: Home / Self Care | Payer: Medicare Other | Source: Ambulatory Visit | Attending: Family Medicine | Admitting: Family Medicine

## 2020-05-20 DIAGNOSIS — M79601 Pain in right arm: Secondary | ICD-10-CM | POA: Insufficient documentation

## 2020-05-20 DIAGNOSIS — M19011 Primary osteoarthritis, right shoulder: Secondary | ICD-10-CM | POA: Diagnosis not present

## 2020-05-20 DIAGNOSIS — Y929 Unspecified place or not applicable: Secondary | ICD-10-CM | POA: Diagnosis not present

## 2020-05-20 DIAGNOSIS — Z043 Encounter for examination and observation following other accident: Secondary | ICD-10-CM | POA: Diagnosis not present

## 2020-05-20 DIAGNOSIS — W19XXXA Unspecified fall, initial encounter: Secondary | ICD-10-CM | POA: Diagnosis not present

## 2020-05-20 DIAGNOSIS — R0781 Pleurodynia: Secondary | ICD-10-CM | POA: Insufficient documentation

## 2020-05-20 DIAGNOSIS — M79621 Pain in right upper arm: Secondary | ICD-10-CM | POA: Diagnosis not present

## 2020-05-20 DIAGNOSIS — Y939 Activity, unspecified: Secondary | ICD-10-CM | POA: Diagnosis not present

## 2020-05-24 ENCOUNTER — Other Ambulatory Visit: Payer: Self-pay

## 2020-05-24 DIAGNOSIS — I739 Peripheral vascular disease, unspecified: Secondary | ICD-10-CM

## 2020-06-09 ENCOUNTER — Encounter (HOSPITAL_COMMUNITY): Payer: Medicare Other

## 2020-06-09 ENCOUNTER — Encounter: Payer: Medicare Other | Admitting: Vascular Surgery

## 2020-06-09 ENCOUNTER — Other Ambulatory Visit (HOSPITAL_COMMUNITY): Payer: Medicare Other

## 2020-06-18 ENCOUNTER — Telehealth: Payer: Self-pay | Admitting: *Deleted

## 2020-06-18 NOTE — Telephone Encounter (Signed)
Agree with order

## 2020-06-18 NOTE — Telephone Encounter (Signed)
Received call from patient.   Reports that she is having difficulty getting up from commode. Requested order for booster seat to be sent to [facility].   Order transcribed.

## 2020-07-09 ENCOUNTER — Encounter: Payer: Medicare Other | Admitting: Vascular Surgery

## 2020-07-09 ENCOUNTER — Encounter: Payer: Self-pay | Admitting: Nurse Practitioner

## 2020-07-09 ENCOUNTER — Ambulatory Visit (INDEPENDENT_AMBULATORY_CARE_PROVIDER_SITE_OTHER): Payer: Medicare Other | Admitting: Nurse Practitioner

## 2020-07-09 ENCOUNTER — Encounter (HOSPITAL_COMMUNITY): Payer: Medicare Other

## 2020-07-09 ENCOUNTER — Other Ambulatory Visit: Payer: Self-pay

## 2020-07-09 ENCOUNTER — Telehealth: Payer: Self-pay | Admitting: Family Medicine

## 2020-07-09 ENCOUNTER — Other Ambulatory Visit (HOSPITAL_COMMUNITY): Payer: Medicare Other

## 2020-07-09 VITALS — BP 110/64 | HR 60 | Temp 97.2°F | Ht 67.0 in | Wt 147.0 lb

## 2020-07-09 DIAGNOSIS — M25511 Pain in right shoulder: Secondary | ICD-10-CM

## 2020-07-09 DIAGNOSIS — M25561 Pain in right knee: Secondary | ICD-10-CM | POA: Diagnosis not present

## 2020-07-09 DIAGNOSIS — M150 Primary generalized (osteo)arthritis: Secondary | ICD-10-CM

## 2020-07-09 DIAGNOSIS — G8929 Other chronic pain: Secondary | ICD-10-CM

## 2020-07-09 DIAGNOSIS — R531 Weakness: Secondary | ICD-10-CM | POA: Diagnosis not present

## 2020-07-09 DIAGNOSIS — M159 Polyosteoarthritis, unspecified: Secondary | ICD-10-CM

## 2020-07-09 DIAGNOSIS — M8949 Other hypertrophic osteoarthropathy, multiple sites: Secondary | ICD-10-CM | POA: Diagnosis not present

## 2020-07-09 NOTE — Progress Notes (Signed)
Subjective:    Patient ID: Charlotte Henry, female    DOB: 1940-09-03, 80 y.o.   MRN: [id number]  HPI: Charlotte Henry is a 80 y.o. female presenting for right knee pain.  Chief Complaint  Patient presents with  . Pain    Pain in the right knee, taking tylenol for the pain. 3 falls in Dec   KNEE PAIN Patient is asking for a wheelchair to help with mobility issues while in the home.  Has fallen multiple times in past 6 months.  Duration: chronic Involved knee: right Mechanism of injury: fall; fell on her knee Location:lateral Onset: gradual Severity: severe  Quality: dull sharp Frequency: constant Radiation: no Aggravating factors: trying to get up on it, hurts with any movement;   Alleviating factors: completely being still; Tylenol extra strength, Asper-cream, lidocaine patch,  Status: stable Treatments attempted: Tylenol, Asper-cream, lidocaine patch  Relief with NSAIDs?:  Cannot take NSAIDs Weakness with weight bearing or walking: no Sensation of giving way: no Locking: no Popping: no Bruising: no Swelling: no Redness: no Paresthesias/decreased sensation: no Fevers: no  Also has shoulder pain since fall; unable to perform full ROM per son and cries out in pain with movement.  Requesting referral to Orthopedic today.  Allergies  Allergen Reactions  . Ace Inhibitors Hives, Shortness Of Breath and Swelling  . Aspirin Other (See Comments)    Blood in stool and nose bleed  . Codeine Swelling  . Pineapple Shortness Of Breath and Swelling  . Plasticized Base [Plastibase] Hives and Swelling    No plastics  . Ultram [Tramadol] Swelling  . Adhesive [Tape] Swelling and Rash  . Naprosyn [Naproxen] Swelling  . Biaxin [Clarithromycin] Nausea And Vomiting and Other (See Comments)    Dizziness.  . Clindamycin/Lincomycin Other (See Comments)    Burning sensation throat, abdomen  . Linzess [Linaclotide]     DIARRHEA, FECAL INCONTINENCE  . Metronidazole     NAUSEA AND  WEAKNESS  . Nsaids   . Penicillins     Lip swelling, Hives Has patient had a PCN reaction causing immediate rash, facial/tongue/throat swelling, SOB or lightheadedness with hypotension:YES Has patient had a PCN reaction causing severe rash involving mucus membranes or skin necrosis: NO Has patient had a PCN reaction that required hospitalization NO Has patient had a PCN reaction occurring within the last 10 years: NO If all of the above answers are "NO", then may proceed with Cephalosporin use.  . Varenicline Tartrate Swelling  . Xarelto [Rivaroxaban]     Bleeding    Outpatient Encounter Medications as of 07/09/2020  Medication Sig  . albuterol (PROVENTIL HFA;VENTOLIN HFA) 108 (90 Base) MCG/ACT inhaler Inhale 2 puffs into the lungs every 4 (four) hours as needed for wheezing or shortness of breath.  . calcium citrate-vitamin D (CITRACAL+D) 315-200 MG-UNIT per tablet Take 1 tablet by mouth daily.  . cephALEXin (KEFLEX) 500 MG capsule Take 1 pill BID x 5 days  . clotrimazole (LOTRIMIN) 1 % cream Apply 1 application topically 2 (two) times daily. Beneath stomach  . Cyanocobalamin (VITAMIN B 12 PO) Take 1 tablet by mouth daily.   Marland Kitchen donepezil (ARICEPT) 5 MG tablet Take 1 tablet (5 mg total) by mouth at bedtime.  Marland Kitchen esomeprazole (NEXIUM) 40 MG capsule TAKE 1 CAPSULE BY MOUTH DAILY BEFORE BREAKFAST.  Marland Kitchen ezetimibe (ZETIA) 10 MG tablet Take 1 tablet (10 mg total) by mouth daily.  . ferrous sulfate 325 (65 FE) MG tablet Take 325 mg by mouth daily  with breakfast.  . lidocaine (LIDODERM) 5 % Place 1 patch onto the skin daily. Remove & Discard patch within 12 hours or as directed by MD  . Multiple Vitamin (MULTIVITAMIN WITH MINERALS) TABS tablet Take 1 tablet by mouth daily.  Marland Kitchen nystatin (MYCOSTATIN/NYSTOP) powder Apply 1 application topically 3 (three) times daily. Beneath breast and stomach  . [DISCONTINUED] ALPRAZolam (XANAX) 0.25 MG tablet TAKE 1 TABLET BY MOUTH AT BEDTIME AS NEEDED.  . [DISCONTINUED]  atorvastatin (LIPITOR) 10 MG tablet Take 10 mg by mouth daily.   . [DISCONTINUED] gabapentin (NEURONTIN) 100 MG capsule TAKE 1 TO 2 CAPSULES BY MOUTH AT BEDTIME FOR NERVE PAIN.  . [DISCONTINUED] meclizine (ANTIVERT) 25 MG tablet Take 1 tablet (25 mg total) by mouth every 6 (six) hours as needed for dizziness.  Marland Kitchen escitalopram (LEXAPRO) 10 MG tablet Take 1 tablet (10 mg total) by mouth daily. (Patient not taking: Reported on 07/09/2020)  . furosemide (LASIX) 40 MG tablet Take 1 tablet (40 mg total) by mouth daily. (Patient not taking: Reported on 07/09/2020)   No facility-administered encounter medications on file as of 07/09/2020.    Patient Active Problem List   Diagnosis Date Noted  . Iron deficiency anemia 04/28/2020  . Cognitive change 03/25/2019  . B12 deficiency 03/24/2019  . AAA (abdominal aortic aneurysm) ([place]) 02/17/2019  . Vertigo 01/30/2019  . 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[place]) 01/10/2018  . Poor appetite 12/19/2017  . Itching 07/19/2017  . Gait instability 02/26/2017  . Symptomatic anemia 08/15/2016  . Protein-calorie malnutrition ([place]) 08/04/2016  . Helicobacter pylori gastritis 07/26/2016  . Generalized weakness 07/09/2016  . Normocytic anemia 07/03/2016  . CHB (complete heart block) ([place])   . COPD (chronic obstructive pulmonary disease) ([place])   . Tobacco use disorder 10/15/2015  . Back pain 12/30/2014  . OA (osteoarthritis) 12/30/2014  . Carotid stenosis 03/11/2014  . PVD (peripheral vascular disease) ([place]) 03/11/2014  . Colon cancer screening 02/04/2014  . Overactive bladder 12/31/2013  . Rash and nonspecific skin eruption 05/19/2013  . Numbness and tingling-bilat foot 03/05/2013  . IBS (irritable bowel syndrome) 12/30/2012  . Peripheral neuropathy 07/01/2012  . Insomnia 07/01/2012  . Chronic kidney disease (CKD), stage III (moderate) ([place]) 08/25/2010  . Cardiovascular disease 08/16/2009  . Cerebrovascular disease 08/16/2009  . Peripheral vascular  disease ([place]) 08/16/2009  . Type 2 diabetes mellitus with diabetic neuropathy, unspecified ([place]) 10/26/2008  . Hyperlipidemia 10/26/2008  . Gout, unspecified 10/26/2008  . Essential hypertension 10/26/2008  . Asthmatic bronchitis 10/26/2008  . Gastroesophageal reflux disease 10/26/2008    Past Medical History:  Diagnosis Date  . 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 ASCVD (arteriosclerotic cardiovascular disease)    a. cath in 4/04,-80%LAD; 70% RCA, -> DESx2; residual 60% distal RCA; nl EF  b. Nuc 02/2016 aborted due to bradycardia. Will need to be rescheduled   . Carotid artery occlusion   . Cerebrovascular disease   . CHB (complete heart block) ([place])    a. s/p PPM on 03/24/16 with a St. Jude (serial number [id number]) pacemaker  . Chronic kidney disease (CKD), stage III (moderate) ([facility])   . Clostridium difficile colitis 03/2013   a. 03/2013  . COPD (chronic obstructive pulmonary disease) ([place])   . Coronary disease   . Diverticulosis   . GERD (gastroesophageal reflux disease)   . Gout   . H pylori ulcer   . Hyperlipidemia    chose to stop statin, takes Zetia  . Hypertension    not  currently on medication  . IBS (irritable bowel syndrome)   . PVD (peripheral vascular disease) ([place])    a. moderate to severely decreased ABI's in 8/08, sig. aortic inflow disease b. repeat ABI's in 2011 improved- mild disease on the left and moderate to severe disease on the right  . S/P placement of cardiac pacemaker    a. 03/24/16: St. Jude (serial number [id number]) pacemaker  . Symptomatic anemia 08/15/2016  . TIA (transient ischemic attack) 2012  . Tobacco abuse    a. 50 pack years continuing at 1/2 pack per day  . Tobacco use disorder 10/15/2015    Relevant past medical, surgical, family and social history reviewed and updated as indicated. Interim medical history since our last visit reviewed.  Review of Systems Per HPI unless specifically indicated above     Objective:    BP 110/64    Pulse 60   Temp (!) 97.2 F (36.2 C)   Ht 5\' 7"  (1.702 m)   Wt 147 lb (66.7 kg)   BMI 23.02 kg/m   Wt Readings from Last 3 Encounters:  07/09/20 147 lb (66.7 kg)  05/19/20 143 lb (64.9 kg)  01/06/20 141 lb 3.2 oz (64 kg)    Physical Exam Vitals and nursing note reviewed.  Constitutional:      General: She is not in acute distress.    Appearance: Normal appearance. She is not toxic-appearing.     Comments: Sitting in wheelchair  Eyes:     General: No scleral icterus.    Extraocular Movements: Extraocular movements intact.  Musculoskeletal:        General: Tenderness present.     Right shoulder: Bony tenderness present. Decreased range of motion. Decreased strength. Normal pulse.     Left shoulder: Normal.     Right knee: Bony tenderness present. No effusion. Decreased range of motion. Normal pulse.     Left knee: Normal. No effusion. Normal range of motion. Normal pulse.     Right lower leg: No edema.     Left lower leg: No edema.     Comments: Most tender to palpation on lateral aspect of knee joint  Skin:    General: Skin is warm and dry.     Capillary Refill: Capillary refill takes less than 2 seconds.     Coloration: Skin is not jaundiced or pale.     Findings: No erythema.  Neurological:     Mental Status: She is alert and oriented to person, place, and time.     Motor: Weakness present.  Psychiatric:        Mood and Affect: Mood normal.        Thought Content: Thought content normal.        Judgment: Judgment normal.       Assessment & Plan:  1. Primary osteoarthritis involving multiple joints Chronic, ongoing.  Patient unable to take NSAIDs due to history of CKD and heart disease.  Not a candidate for narcotics with history of multiple falls.  Currently taking extra strength Tylenol - okay to continue this and start Voltaren gel for joint pain.  Will refer back to Orthopedic in [place].  Declines imaging or joint injection today.  2. Chronic pain of right  knee  - Ambulatory referral to Orthopedics  3. Chronic right shoulder pain  - Ambulatory referral to Orthopedics  4. Weakness Patient with multiple falls and poor oral intake.  Also with significant joint pain limiting ability perform ADLs around home.  Will place  order for wheelchair.   Follow up plan: Return if symptoms worsen or fail to improve.

## 2020-07-09 NOTE — Patient Instructions (Signed)
F/u as needed  Start Voltaren gel for knee pain  Schedule with Dr. Aline Brochure (bone doctor) for ongoing knee pain.  We are placing referral today.

## 2020-07-12 ENCOUNTER — Ambulatory Visit (INDEPENDENT_AMBULATORY_CARE_PROVIDER_SITE_OTHER): Payer: Medicare Other

## 2020-07-12 DIAGNOSIS — I495 Sick sinus syndrome: Secondary | ICD-10-CM | POA: Diagnosis not present

## 2020-07-14 LAB — CUP PACEART REMOTE DEVICE CHECK
Battery Remaining Longevity: 106 mo
Battery Remaining Percentage: 95.5 %
Battery Voltage: 2.98 V
Brady Statistic AP VP Percent: 65 %
Brady Statistic AP VS Percent: 1 %
Brady Statistic AS VP Percent: 35 %
Brady Statistic AS VS Percent: 1 %
Brady Statistic RA Percent Paced: 63 %
Brady Statistic RV Percent Paced: 99 %
Date Time Interrogation Session: 20220207020022
Implantable Lead Implant Date: 20171020
Implantable Lead Implant Date: 20171020
Implantable Lead Location: 753859
Implantable Lead Location: 753860
Implantable Pulse Generator Implant Date: 20171020
Lead Channel Impedance Value: 390 Ohm
Lead Channel Impedance Value: 410 Ohm
Lead Channel Pacing Threshold Amplitude: 0.75 V
Lead Channel Pacing Threshold Amplitude: 1 V
Lead Channel Pacing Threshold Pulse Width: 0.4 ms
Lead Channel Pacing Threshold Pulse Width: 0.4 ms
Lead Channel Sensing Intrinsic Amplitude: 1.9 mV
Lead Channel Sensing Intrinsic Amplitude: 7.5 mV
Lead Channel Setting Pacing Amplitude: 2 V
Lead Channel Setting Pacing Amplitude: 2.5 V
Lead Channel Setting Pacing Pulse Width: 0.4 ms
Lead Channel Setting Sensing Sensitivity: 3.5 mV
Pulse Gen Model: 2272
Pulse Gen Serial Number: 3180497

## 2020-07-19 NOTE — Progress Notes (Signed)
Remote pacemaker transmission.   

## 2020-07-21 ENCOUNTER — Ambulatory Visit: Payer: Medicare Other | Admitting: Family Medicine

## 2020-07-26 ENCOUNTER — Ambulatory Visit (INDEPENDENT_AMBULATORY_CARE_PROVIDER_SITE_OTHER): Payer: Medicare Other | Admitting: Orthopedic Surgery

## 2020-07-26 ENCOUNTER — Ambulatory Visit: Payer: Medicare Other

## 2020-07-26 DIAGNOSIS — G8929 Other chronic pain: Secondary | ICD-10-CM

## 2020-07-26 DIAGNOSIS — M25561 Pain in right knee: Secondary | ICD-10-CM

## 2020-07-28 NOTE — Telephone Encounter (Signed)
ERROR

## 2020-07-29 ENCOUNTER — Other Ambulatory Visit: Payer: Self-pay | Admitting: Orthopedic Surgery

## 2020-07-29 ENCOUNTER — Encounter: Payer: Self-pay | Admitting: Orthopedic Surgery

## 2020-07-29 ENCOUNTER — Other Ambulatory Visit: Payer: Self-pay

## 2020-07-29 ENCOUNTER — Ambulatory Visit: Payer: Medicare Other

## 2020-07-29 ENCOUNTER — Ambulatory Visit (INDEPENDENT_AMBULATORY_CARE_PROVIDER_SITE_OTHER): Payer: Medicare Other | Admitting: Orthopedic Surgery

## 2020-07-29 VITALS — BP 146/70 | HR 62 | Ht 67.0 in

## 2020-07-29 DIAGNOSIS — G8929 Other chronic pain: Secondary | ICD-10-CM

## 2020-07-29 DIAGNOSIS — M25561 Pain in right knee: Secondary | ICD-10-CM | POA: Diagnosis not present

## 2020-07-29 MED ORDER — TRAMADOL-ACETAMINOPHEN 37.5-325 MG PO TABS: 1.0000 | ORAL_TABLET | ORAL | 0 refills | Status: AC | PRN

## 2020-07-29 NOTE — Progress Notes (Signed)
NEW PROBLEM//OFFICE VISIT  Summary assessment and plan: 80 year old female right knee pain question if this arthritis versus some type of meniscal pathology  We injected her right knee put her on tramadol she has lots of allergies  Reexamine in 2 weeks  A steroid injection was performed at right knee using 1% plain Lidocaine and 6 mg mg of Celestone. This was well tolerated.   Chief Complaint  Patient presents with  . Knee Pain    Right knee pain    80 year old female extremely poor historian says that her knee started hurting 2 months ago and she had trouble walking and bending and straightening it since that time.  Her relative is with her says she drags the leg around and does not stand on it with full weightbearing.  She says she fell but the knee may have been hurting even before that just worse since she fell   Review of Systems  Constitutional: Negative for chills and fever.  Skin: Negative.      Past Medical History:  Diagnosis Date  . Arterial fibromuscular dysplasia ([place])   . ASCVD (arteriosclerotic cardiovascular disease)    a. cath in 4/04,-80%LAD; 70% RCA, -> DESx2; residual 60% distal RCA; nl EF  b. Nuc 02/2016 aborted due to bradycardia. Will need to be rescheduled   . Carotid artery occlusion   . Cerebrovascular disease   . CHB (complete heart block) ([place])    a. s/p PPM on 03/24/16 with a St. Jude (serial number [id number]) pacemaker  . Chronic kidney disease (CKD), stage III (moderate) ([facility])   . Clostridium difficile colitis 03/2013   a. 03/2013  . COPD (chronic obstructive pulmonary disease) ([place])   . Coronary disease   . Diverticulosis   . GERD (gastroesophageal reflux disease)   . Gout   . H pylori ulcer   . Hyperlipidemia    chose to stop statin, takes Zetia  . Hypertension    not currently on medication  . IBS (irritable bowel syndrome)   . PVD (peripheral vascular disease) ([place])    a. moderate to severely decreased ABI's in 8/08, sig. aortic  inflow disease b. repeat ABI's in 2011 improved- mild disease on the left and moderate to severe disease on the right  . S/P placement of cardiac pacemaker    a. 03/24/16: St. Jude (serial number [id number]) pacemaker  . Symptomatic anemia 08/15/2016  . TIA (transient ischemic attack) 2012  . Tobacco abuse    a. 50 pack years continuing at 1/2 pack per day  . Tobacco use disorder 10/15/2015    Past Surgical History:  Procedure Laterality Date  . ABDOMINAL HYSTERECTOMY    . carpel tunnel release     bilateral  . CHOLECYSTECTOMY    . COLONOSCOPY  2009  . COLONOSCOPY N/A 08/04/2016   incomplete due to prep.   . COLONOSCOPY N/A 08/05/2016   Dr. Oneida Alar: moderately redundant rectosigmoid and sigmoid, normal TI, single large-mouthed diverticulum in hepatic flexure, external hemorrhoids, path negative for microscopic colitis   . ENTEROSCOPY N/A 08/18/2016   Procedure: ENTEROSCOPY;  Surgeon: Daneil Dolin, MD;  Location: [facility];  Service: Endoscopy;  Laterality: N/A;  Pediatric colonoscope  . EP IMPLANTABLE DEVICE N/A 03/24/2016   Procedure: Pacemaker Implant;  Surgeon: [name], MD;  Location: [facility];  Service: Cardiovascular;  Laterality: N/A;  . ESOPHAGOGASTRODUODENOSCOPY N/A 08/04/2016   Dr. Oneida Alar: non-critical Schatzki's ring, small hiatal hernia, +H.pylori gastritis on path.   Marland Kitchen GIVENS CAPSULE  STUDY N/A 08/16/2016   Procedure: GIVENS CAPSULE STUDY;  Surgeon: Daneil Dolin, MD;  Location: [facility];  Service: Endoscopy;  Laterality: N/A;  . TOTAL ABDOMINAL HYSTERECTOMY W/ BILATERAL SALPINGOOPHORECTOMY  1979    Family History  Problem Relation Age of Onset  . Liver disease Mother 31  . Hypertension Mother   . Cerebral aneurysm Father   . Aneurysm Father 35  . Cancer Sister   . Liver disease Brother   . Liver disease Brother   . Liver disease Brother   . Diabetes type II Brother   . Alcohol abuse Brother   . Colon cancer Neg Hx    Social History   Tobacco  Use  . Smoking status: Current Every Day Smoker    Packs/day: 1.00    Years: 50.00    Pack years: 50.00    Types: Cigarettes    Start date: 02/02/1966  . Smokeless tobacco: Never Used  . Tobacco comment: 1/2 pack daily  Vaping Use  . Vaping Use: Never used  Substance Use Topics  . Alcohol use: No    Alcohol/week: 0.0 standard drinks    Comment: H/O of 6 pack per day quiting in 2003  . Drug use: No    Allergies  Allergen Reactions  . Ace Inhibitors Hives, Shortness Of Breath and Swelling  . Aspirin Other (See Comments)    Blood in stool and nose bleed  . Codeine Swelling  . Pineapple Shortness Of Breath and Swelling  . Plasticized Base [Plastibase] Hives and Swelling    No plastics  . Ultram [Tramadol] Swelling  . Adhesive [Tape] Swelling and Rash  . Naprosyn [Naproxen] Swelling  . Biaxin [Clarithromycin] Nausea And Vomiting and Other (See Comments)    Dizziness.  . Clindamycin/Lincomycin Other (See Comments)    Burning sensation throat, abdomen  . Linzess [Linaclotide]     DIARRHEA, FECAL INCONTINENCE  . Metronidazole     NAUSEA AND WEAKNESS  . Nsaids   . Penicillins     Lip swelling, Hives Has patient had a PCN reaction causing immediate rash, facial/tongue/throat swelling, SOB or lightheadedness with hypotension:YES Has patient had a PCN reaction causing severe rash involving mucus membranes or skin necrosis: NO Has patient had a PCN reaction that required hospitalization NO Has patient had a PCN reaction occurring within the last 10 years: NO If all of the above answers are "NO", then may proceed with Cephalosporin use.  . Varenicline Tartrate Swelling  . Xarelto [Rivaroxaban]     Bleeding    Current Meds  Medication Sig  . albuterol (PROVENTIL HFA;VENTOLIN HFA) 108 (90 Base) MCG/ACT inhaler Inhale 2 puffs into the lungs every 4 (four) hours as needed for wheezing or shortness of breath.  . calcium citrate-vitamin D (CITRACAL+D) 315-200 MG-UNIT per tablet Take 1  tablet by mouth daily.  . cephALEXin (KEFLEX) 500 MG capsule Take 1 pill BID x 5 days  . clotrimazole (LOTRIMIN) 1 % cream Apply 1 application topically 2 (two) times daily. Beneath stomach  . Cyanocobalamin (VITAMIN B 12 PO) Take 1 tablet by mouth daily.   Marland Kitchen donepezil (ARICEPT) 5 MG tablet Take 1 tablet (5 mg total) by mouth at bedtime.  Marland Kitchen escitalopram (LEXAPRO) 10 MG tablet Take 1 tablet (10 mg total) by mouth daily.  Marland Kitchen esomeprazole (NEXIUM) 40 MG capsule TAKE 1 CAPSULE BY MOUTH DAILY BEFORE BREAKFAST.  Marland Kitchen ezetimibe (ZETIA) 10 MG tablet Take 1 tablet (10 mg total) by mouth daily.  . ferrous sulfate 325 (  65 FE) MG tablet Take 325 mg by mouth daily with breakfast.  . lidocaine (LIDODERM) 5 % Place 1 patch onto the skin daily. Remove & Discard patch within 12 hours or as directed by MD  . Multiple Vitamin (MULTIVITAMIN WITH MINERALS) TABS tablet Take 1 tablet by mouth daily.  Marland Kitchen nystatin (MYCOSTATIN/NYSTOP) powder Apply 1 application topically 3 (three) times daily. Beneath breast and stomach  . traMADol-acetaminophen (ULTRACET) 37.5-325 MG tablet Take 1 tablet by mouth every 4 (four) hours as needed for up to 14 days.    BP (!) 146/70   Pulse 62   Ht 5\' 7"  (1.702 m)   BMI 23.02 kg/m   Physical Exam Constitutional:      General: She is not in acute distress.    Appearance: She is well-developed.     Comments: Well developed, well nourished Normal grooming and hygiene     Cardiovascular:     Comments: No peripheral edema Musculoskeletal:     Comments: Mild varus alignment to the right knee she does not extend the last 15 degrees active or passive flexion is good 125 the knee feels stable she is tender around the patellar region and peripatellar soft tissues as well as the medial and lateral joint line no effusion  Skin:    General: Skin is warm and dry.  Neurological:     Mental Status: She is alert and oriented to person, place, and time.     Sensory: No sensory deficit.      Coordination: Coordination normal.     Gait: Gait abnormal.     Deep Tendon Reflexes: Reflexes are normal and symmetric.  Psychiatric:        Mood and Affect: Mood normal.        Behavior: Behavior normal.        Thought Content: Thought content normal.        Judgment: Judgment normal.     Comments: Affect normal          MEDICAL DECISION MAKING  A.  Encounter Diagnosis  Name Primary?  . Chronic pain of right knee Yes    B. DATA ANALYSED:   IMAGING: Interpretation of images: internal  internal images [facility] lateral right knee valgus osteoarthritis Orders: None none available  Outside records reviewed: None available   C. MANAGEMENT   As above  Meds ordered this encounter  Medications  . traMADol-acetaminophen (ULTRACET) 37.5-325 MG tablet    Sig: Take 1 tablet by mouth every 4 (four) hours as needed for up to 14 days.    Dispense:  84 tablet    Refill:  0      Arther Abbott, MD  07/29/2020 2:35 PM

## 2020-08-02 ENCOUNTER — Other Ambulatory Visit: Payer: Self-pay

## 2020-08-02 DIAGNOSIS — I6523 Occlusion and stenosis of bilateral carotid arteries: Secondary | ICD-10-CM

## 2020-08-06 ENCOUNTER — Telehealth: Payer: Self-pay

## 2020-08-06 NOTE — Telephone Encounter (Signed)
-----   Message from Bernita Raisin, RN sent at 08/04/2019  8:38 AM EST ----- Regarding: yrly carotid US  Herminio Commons, MD  08/01/2019 3:14 PM EST  Blockage severity appears similar to December 2019. Repeat study in 1 year.  Sched in 1 yr

## 2020-08-06 NOTE — Telephone Encounter (Signed)
I will forward to front desk to schedule, orders are in.

## 2020-08-09 ENCOUNTER — Telehealth: Payer: Self-pay

## 2020-08-09 NOTE — Telephone Encounter (Signed)
Order faxed to [employer] for pt wheelchair signed by NP

## 2020-08-10 NOTE — Telephone Encounter (Signed)
Noted  

## 2020-08-12 ENCOUNTER — Other Ambulatory Visit: Payer: Self-pay

## 2020-08-12 ENCOUNTER — Ambulatory Visit (INDEPENDENT_AMBULATORY_CARE_PROVIDER_SITE_OTHER): Payer: Medicare Other | Admitting: Orthopedic Surgery

## 2020-08-12 ENCOUNTER — Encounter: Payer: Self-pay | Admitting: Orthopedic Surgery

## 2020-08-12 VITALS — BP 158/77 | HR 60 | Ht 67.0 in | Wt 147.0 lb

## 2020-08-12 DIAGNOSIS — G8929 Other chronic pain: Secondary | ICD-10-CM | POA: Diagnosis not present

## 2020-08-12 DIAGNOSIS — M25561 Pain in right knee: Secondary | ICD-10-CM | POA: Diagnosis not present

## 2020-08-12 NOTE — Progress Notes (Signed)
This is a follow-up visit  Chief Complaint  Patient presents with  . Knee Pain    Right / patient states not better with injection, but she is walking now, not in wheelchair     80 year old female who came in in a wheelchair last time was injected put on tramadol/Ultracet says that her knee has improved at least now she can walk with a cane  She does not want to try another injection.  It seems like her knee is better she has better flexion still has a little loss of extension  She is tender on the lateral side she may have a trace joint fluid  In any event I am comfortable and they are comfortable with continuing with the nighttime Ultracet and the cane and a 4-week follow-up to see if we need to reinject the knee  Encounter Diagnosis  Name Primary?  . Chronic pain of right knee Yes

## 2020-08-18 ENCOUNTER — Ambulatory Visit (HOSPITAL_COMMUNITY): Admission: RE | Admit: 2020-08-18 | Payer: Medicare Other | Source: Ambulatory Visit

## 2020-08-24 ENCOUNTER — Telehealth: Payer: Self-pay | Admitting: Pharmacist

## 2020-08-24 NOTE — Progress Notes (Addendum)
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  Recent office visits:  07/09/20 Eulogio Bear, NP. For osteoarthritis. Referral for Dr. Aline Brochure. STOPPED Alprazolam, Atorvastatin, Gabapentin and Meclizine.  05/19/20 Dr. Buelah Manis. For right rib pain. STARTED Cephalexin 500 mg for 5 days, Clotrimazole 1% twice daily, and Nystatin 100000 unit/GM three times daily.   Recent consult visits:  08/12/20 Ortho/Sports Carole Civil, MD. For chronic pain of right knee. No medication changes. 07/29/20 Ortho/Sports Carole Civil, MD. For chronic pain of right knee. X-rays taken. STARTED Tramadol-Acetaminophen 37.5-325 mg. STOPPED Furosemide.  07/26/20 Ortho/Sports Carole Civil, MD. For chronic pain of right knee. No medication changes.  04/27/20 Telemedicine Oncology Derek Jack, MD. For Normocytic anemia. No medication changes.  Hospital visits:  None in previous 6 months  Medications: Outpatient Encounter Medications as of 08/24/2020  Medication Sig   albuterol (PROVENTIL HFA;VENTOLIN HFA) 108 (90 Base) MCG/ACT inhaler Inhale 2 puffs into the lungs every 4 (four) hours as needed for wheezing or shortness of breath.   calcium citrate-vitamin D (CITRACAL+D) 315-200 MG-UNIT per tablet Take 1 tablet by mouth daily.   cephALEXin (KEFLEX) 500 MG capsule Take 1 pill BID x 5 days   clotrimazole (LOTRIMIN) 1 % cream Apply 1 application topically 2 (two) times daily. Beneath stomach   Cyanocobalamin (VITAMIN B 12 PO) Take 1 tablet by mouth daily.    donepezil (ARICEPT) 5 MG tablet Take 1 tablet (5 mg total) by mouth at bedtime.   escitalopram (LEXAPRO) 10 MG tablet Take 1 tablet (10 mg total) by mouth daily.   esomeprazole (NEXIUM) 40 MG capsule TAKE 1 CAPSULE BY MOUTH  DAILY BEFORE BREAKFAST.   ezetimibe (ZETIA) 10 MG tablet Take 1 tablet (10 mg total) by mouth daily.   ferrous sulfate 325 (65 FE) MG tablet Take 325 mg by mouth daily with breakfast.   lidocaine (LIDODERM) 5 % Place 1 patch onto the skin daily. Remove & Discard patch within 12 hours or as directed by MD   Multiple Vitamin (MULTIVITAMIN WITH MINERALS) TABS tablet Take 1 tablet by mouth daily.   nystatin (MYCOSTATIN/NYSTOP) powder Apply 1 application topically 3 (three) times daily. Beneath breast and stomach   No facility-administered encounter medications on file as of 08/24/2020.   GEN CALL: Patient stated she does not feel good today, she stated her right leg has been hurting her. She stated she eats fruits and vegetables sometimes. She stated most of the time she eats only twice a day. She stated she drinks waters and ensures daily. She stated she all of her medications are within her budget. She stated she does not do much exercising but she does make her own food.   Star Rating Drugs: Donepezil 5 mg 06/23/20 30 DS  Follow-Up: Pharmacist Review  [name], RMA Clinical Pharmacist Assistant [phone]  10 minutes spent in review, coordination, and documentation.  Reviewed by: Beverly Milch, PharmD Clinical Pharmacist [facility] [phone]

## 2020-08-29 ENCOUNTER — Emergency Department (HOSPITAL_COMMUNITY): Payer: Medicare Other

## 2020-08-29 ENCOUNTER — Emergency Department (HOSPITAL_COMMUNITY)
Admission: EM | Admit: 2020-08-29 | Discharge: 2020-08-29 | Disposition: A | Discharge status: Home / Self Care | Payer: Medicare Other | Attending: Emergency Medicine | Admitting: Emergency Medicine

## 2020-08-29 ENCOUNTER — Other Ambulatory Visit: Payer: Self-pay

## 2020-08-29 ENCOUNTER — Encounter (HOSPITAL_COMMUNITY): Payer: Self-pay | Admitting: Emergency Medicine

## 2020-08-29 DIAGNOSIS — R21 Rash and other nonspecific skin eruption: Secondary | ICD-10-CM | POA: Diagnosis present

## 2020-08-29 DIAGNOSIS — E1122 Type 2 diabetes mellitus with diabetic chronic kidney disease: Secondary | ICD-10-CM | POA: Diagnosis not present

## 2020-08-29 DIAGNOSIS — Z79899 Other long term (current) drug therapy: Secondary | ICD-10-CM | POA: Diagnosis not present

## 2020-08-29 DIAGNOSIS — F17210 Nicotine dependence, cigarettes, uncomplicated: Secondary | ICD-10-CM | POA: Insufficient documentation

## 2020-08-29 DIAGNOSIS — Z950 Presence of cardiac pacemaker: Secondary | ICD-10-CM | POA: Diagnosis not present

## 2020-08-29 DIAGNOSIS — N183 Chronic kidney disease, stage 3 unspecified: Secondary | ICD-10-CM | POA: Diagnosis not present

## 2020-08-29 DIAGNOSIS — R55 Syncope and collapse: Secondary | ICD-10-CM | POA: Insufficient documentation

## 2020-08-29 DIAGNOSIS — B372 Candidiasis of skin and nail: Secondary | ICD-10-CM | POA: Insufficient documentation

## 2020-08-29 DIAGNOSIS — I129 Hypertensive chronic kidney disease with stage 1 through stage 4 chronic kidney disease, or unspecified chronic kidney disease: Secondary | ICD-10-CM | POA: Diagnosis not present

## 2020-08-29 DIAGNOSIS — J449 Chronic obstructive pulmonary disease, unspecified: Secondary | ICD-10-CM | POA: Diagnosis not present

## 2020-08-29 LAB — CBC WITH DIFFERENTIAL/PLATELET
Abs Immature Granulocytes: 0.03 10*3/uL (ref 0.00–0.07)
Basophils Absolute: 0 10*3/uL (ref 0.0–0.1)
Basophils Relative: 1 %
Eosinophils Absolute: 0.1 10*3/uL (ref 0.0–0.5)
Eosinophils Relative: 1 %
HCT: 32.9 % — ABNORMAL LOW (ref 36.0–46.0)
Hemoglobin: 10.8 g/dL — ABNORMAL LOW (ref 12.0–15.0)
Immature Granulocytes: 0 %
Lymphocytes Relative: 34 %
Lymphs Abs: 2.4 10*3/uL (ref 0.7–4.0)
MCH: 30 pg (ref 26.0–34.0)
MCHC: 32.8 g/dL (ref 30.0–36.0)
MCV: 91.4 fL (ref 80.0–100.0)
Monocytes Absolute: 0.7 10*3/uL (ref 0.1–1.0)
Monocytes Relative: 10 %
Neutro Abs: 3.8 10*3/uL (ref 1.7–7.7)
Neutrophils Relative %: 54 %
Platelets: 244 10*3/uL (ref 150–400)
RBC: 3.6 MIL/uL — ABNORMAL LOW (ref 3.87–5.11)
RDW: 13.7 % (ref 11.5–15.5)
WBC: 7 10*3/uL (ref 4.0–10.5)
nRBC: 0 % (ref 0.0–0.2)

## 2020-08-29 LAB — URINALYSIS, ROUTINE W REFLEX MICROSCOPIC
Bilirubin Urine: NEGATIVE
Glucose, UA: NEGATIVE mg/dL
Hgb urine dipstick: NEGATIVE
Ketones, ur: NEGATIVE mg/dL
Leukocytes,Ua: NEGATIVE
Nitrite: NEGATIVE
Protein, ur: NEGATIVE mg/dL
Specific Gravity, Urine: 1.015 (ref 1.005–1.030)
pH: 5 (ref 5.0–8.0)

## 2020-08-29 LAB — CBG MONITORING, ED: Glucose-Capillary: 87 mg/dL (ref 70–99)

## 2020-08-29 LAB — CK: Total CK: 169 U/L (ref 38–234)

## 2020-08-29 LAB — BASIC METABOLIC PANEL
Anion gap: 8 (ref 5–15)
BUN: 18 mg/dL (ref 8–23)
CO2: 23 mmol/L (ref 22–32)
Calcium: 9.1 mg/dL (ref 8.9–10.3)
Chloride: 107 mmol/L (ref 98–111)
Creatinine, Ser: 0.99 mg/dL (ref 0.44–1.00)
GFR, Estimated: 57 mL/min — ABNORMAL LOW (ref >60)
Glucose, Bld: 95 mg/dL (ref 70–99)
Potassium: 4 mmol/L (ref 3.5–5.1)
Sodium: 138 mmol/L (ref 135–145)

## 2020-08-29 LAB — TROPONIN I (HIGH SENSITIVITY): Troponin I (High Sensitivity): 5 ng/L (ref <18)

## 2020-08-29 MED ORDER — SODIUM CHLORIDE 0.9 % IV BOLUS: 500.0000 mL | Freq: Once | INTRAVENOUS | Status: DC

## 2020-08-29 MED ORDER — SODIUM CHLORIDE 0.9 % IV BOLUS
500.0000 mL | Freq: Once | INTRAVENOUS | Status: AC
  Administered 2020-08-29: 500 mL via INTRAVENOUS

## 2020-08-29 MED ORDER — FUROSEMIDE 20 MG PO TABS: 20.0000 mg | ORAL_TABLET | Freq: Every day | ORAL | 0 refills | Status: DC

## 2020-08-29 NOTE — ED Provider Notes (Addendum)
[facility] EMERGENCY DEPARTMENT Provider Note   CSN: [id number] Arrival date & time: 08/29/20  1326     History Chief Complaint  Patient presents with  . Wound Check    Charlotte Henry is a 80 y.o. female history arterial fibromuscular dysplasia, arteriosclerotic cardiovascular disease, carotid artery occlusion, CVA, heart block status post pacemaker, CKD, COPD, hypertension, hyperlipidemia, GERD, PVD, AAA.  Patient presents to the ER today for wound check beneath her stomach.  Patient reports that she has noticed a smell which reminds her of yeast to that area and she feels that she has noticed small blisters of the area over the past week.  She has been using nystatin intermittently for her symptoms without relief.  Patient denies any associated pain to the area.  Patient denies fever/chills, headache, chest pain/shortness of breath, abdominal pain, nausea/vomiting, dysuria/hematuria, numbness/tingling, weakness or any additional concerns today.  HPI     Past Medical History:  Diagnosis Date  . Arterial fibromuscular dysplasia ([place])   . ASCVD (arteriosclerotic cardiovascular disease)    a. cath in 4/04,-80%LAD; 70% RCA, -> DESx2; residual 60% distal RCA; nl EF  b. Nuc 02/2016 aborted due to bradycardia. Will need to be rescheduled   . Carotid artery occlusion   . Cerebrovascular disease   . CHB (complete heart block) ([place])    a. s/p PPM on 03/24/16 with a St. Jude (serial number [id number]) pacemaker  . Chronic kidney disease (CKD), stage III (moderate) (H[facility])   . Clostridium difficile colitis 03/2013   a. 03/2013  . COPD (chronic obstructive pulmonary disease) ([place])   . Coronary disease   . Diverticulosis   . GERD (gastroesophageal reflux disease)   . Gout   . H pylori ulcer   . Hyperlipidemia    chose to stop statin, takes Zetia  . Hypertension    not currently on medication  . IBS (irritable bowel syndrome)   . PVD (peripheral vascular disease) ([place])    a. moderate to  severely decreased ABI's in 8/08, sig. aortic inflow disease b. repeat ABI's in 2011 improved- mild disease on the left and moderate to severe disease on the right  . S/P placement of cardiac pacemaker    a. 03/24/16: St. Jude (serial number [id number]) pacemaker  . Symptomatic anemia 08/15/2016  . TIA (transient ischemic attack) 2012  . Tobacco abuse    a. 50 pack years continuing at 1/2 pack per day  . Tobacco use disorder 10/15/2015    Patient Active Problem List   Diagnosis Date Noted  . Iron deficiency anemia 04/28/2020  . Cognitive change 03/25/2019  . B12 deficiency 03/24/2019  . AAA (abdominal aortic aneurysm) ([place]) 02/17/2019  . Vertigo 01/30/2019  . 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[place]) 01/10/2018  . Poor appetite 12/19/2017  . Itching 07/19/2017  . Gait instability 02/26/2017  . Symptomatic anemia 08/15/2016  . Protein-calorie malnutrition ([place]) 08/04/2016  . Helicobacter pylori gastritis 07/26/2016  . Generalized weakness 07/09/2016  . Normocytic anemia 07/03/2016  . CHB (complete heart block) ([place])   . COPD (chronic obstructive pulmonary disease) ([place])   . Tobacco use disorder 10/15/2015  . Back pain 12/30/2014  . OA (osteoarthritis) 12/30/2014  . Carotid stenosis 03/11/2014  . PVD (peripheral vascular disease) ([place]) 03/11/2014  . Colon cancer screening 02/04/2014  . Overactive bladder 12/31/2013  . Rash and nonspecific skin eruption 05/19/2013  . Numbness and tingling-bilat foot 03/05/2013  . IBS (irritable bowel syndrome) 12/30/2012  . Peripheral neuropathy 07/01/2012  .  Insomnia 07/01/2012  . Chronic kidney disease (CKD), stage III (moderate) ([place]) 08/25/2010  . Cardiovascular disease 08/16/2009  . Cerebrovascular disease 08/16/2009  . Peripheral vascular disease ([place]) 08/16/2009  . Type 2 diabetes mellitus with diabetic neuropathy, unspecified ([place]) 10/26/2008  . Hyperlipidemia 10/26/2008  . Gout, unspecified 10/26/2008  . Essential  hypertension 10/26/2008  . Asthmatic bronchitis 10/26/2008  . Gastroesophageal reflux disease 10/26/2008    Past Surgical History:  Procedure Laterality Date  . ABDOMINAL HYSTERECTOMY    . carpel tunnel release     bilateral  . CHOLECYSTECTOMY    . COLONOSCOPY  2009  . COLONOSCOPY N/A 08/04/2016   incomplete due to prep.   . COLONOSCOPY N/A 08/05/2016   Dr. Oneida Alar: moderately redundant rectosigmoid and sigmoid, normal TI, single large-mouthed diverticulum in hepatic flexure, external hemorrhoids, path negative for microscopic colitis   . ENTEROSCOPY N/A 08/18/2016   Procedure: ENTEROSCOPY;  Surgeon: Daneil Dolin, MD;  Location: [facility];  Service: Endoscopy;  Laterality: N/A;  Pediatric colonoscope  . EP IMPLANTABLE DEVICE N/A 03/24/2016   Procedure: Pacemaker Implant;  Surgeon: [name], MD;  Location: [place] CV LAB;  Service: Cardiovascular;  Laterality: N/A;  . ESOPHAGOGASTRODUODENOSCOPY N/A 08/04/2016   Dr. Oneida Alar: non-critical Schatzki's ring, small hiatal hernia, +H.pylori gastritis on path.   Marland Kitchen GIVENS CAPSULE STUDY N/A 08/16/2016   Procedure: GIVENS CAPSULE STUDY;  Surgeon: Daneil Dolin, MD;  Location: [facility];  Service: Endoscopy;  Laterality: N/A;  . TOTAL ABDOMINAL HYSTERECTOMY W/ BILATERAL SALPINGOOPHORECTOMY  1979     OB History    Gravida  2   Para  2   Term  2   Preterm      AB      Living        SAB      IAB      Ectopic      Multiple      Live Births              Family History  Problem Relation Age of Onset  . Liver disease Mother 77  . Hypertension Mother   . Cerebral aneurysm Father   . Aneurysm Father 57  . Cancer Sister   . Liver disease Brother   . Liver disease Brother   . Liver disease Brother   . Diabetes type II Brother   . Alcohol abuse Brother   . Colon cancer Neg Hx     Social History   Tobacco Use  . Smoking status: Current Every Day Smoker    Packs/day: 1.00    Years: 50.00    Pack years:  50.00    Types: Cigarettes    Start date: 02/02/1966  . Smokeless tobacco: Never Used  . Tobacco comment: 1/2 pack daily  Vaping Use  . Vaping Use: Never used  Substance Use Topics  . Alcohol use: No    Alcohol/week: 0.0 standard drinks    Comment: H/O of 6 pack per day quiting in 2003  . Drug use: No    Home Medications Prior to Admission medications   Medication Sig Start Date End Date Taking? Authorizing Provider  furosemide (LASIX) 20 MG tablet Take 1 tablet (20 mg total) by mouth daily. 08/29/20  Yes Nuala Alpha A, PA-C  albuterol (PROVENTIL HFA;VENTOLIN HFA) 108 (90 Base) MCG/ACT inhaler Inhale 2 puffs into the lungs every 4 (four) hours as needed for wheezing or shortness of breath. 07/23/15   Alycia Rossetti, MD  calcium citrate-vitamin D (CITRACAL+D) 315-200 MG-UNIT per tablet Take 1 tablet by mouth daily.    [provider]  cephALEXin (KEFLEX) 500 MG capsule Take 1 pill BID x 5 days 05/19/20   Alycia Rossetti, MD  clotrimazole (LOTRIMIN) 1 % cream Apply 1 application topically 2 (two) times daily. Beneath stomach 05/19/20   [place], Modena Nunnery, MD  Cyanocobalamin (VITAMIN B 12 PO) Take 1 tablet by mouth daily.     [provider]  donepezil (ARICEPT) 5 MG tablet Take 1 tablet (5 mg total) by mouth at bedtime. 12/16/19   [place], Modena Nunnery, MD  escitalopram (LEXAPRO) 10 MG tablet Take 1 tablet (10 mg total) by mouth daily. 12/16/19   [place], Modena Nunnery, MD  esomeprazole (NEXIUM) 40 MG capsule TAKE 1 CAPSULE BY MOUTH DAILY BEFORE BREAKFAST. 04/27/20   Derek Jack, MD  ezetimibe (ZETIA) 10 MG tablet Take 1 tablet (10 mg total) by mouth daily. 12/27/18   Alycia Rossetti, MD  ferrous sulfate 325 (65 FE) MG tablet Take 325 mg by mouth daily with breakfast.    [provider]  lidocaine (LIDODERM) 5 % Place 1 patch onto the skin daily. Remove & Discard patch within 12 hours or as directed by MD 12/18/19   Alycia Rossetti, MD  Multiple Vitamin  (MULTIVITAMIN WITH MINERALS) TABS tablet Take 1 tablet by mouth daily. 08/05/16   Johnson, Clanford L, MD  nystatin (MYCOSTATIN/NYSTOP) powder Apply 1 application topically 3 (three) times daily. Beneath breast and stomach 05/19/20   Alycia Rossetti, MD    Allergies    Ace inhibitors, Aspirin, Codeine, Pineapple, Plasticized base [plastibase], Ultram [tramadol], Adhesive [tape], Naprosyn [naproxen], Biaxin [clarithromycin], Clindamycin/lincomycin, Linzess [linaclotide], Metronidazole, Nsaids, Penicillins, Varenicline tartrate, and Xarelto [rivaroxaban]  Review of Systems   Review of Systems Ten systems are reviewed and are negative for acute change except as noted in the HPI  Physical Exam Updated Vital Signs BP (!) 169/68   Pulse 60   Temp 98.5 F (36.9 C) (Oral)   Resp 16   Ht 5\' 7"  (1.702 m)   Wt 66.7 kg   SpO2 100%   BMI 23.02 kg/m   Physical Exam Constitutional:      General: She is not in acute distress.    Appearance: Normal appearance. She is well-developed. She is not ill-appearing or diaphoretic.  HENT:     Head: Normocephalic and atraumatic.  Eyes:     General: Vision grossly intact. Gaze aligned appropriately.     Pupils: Pupils are equal, round, and reactive to light.  Neck:     Trachea: Trachea and phonation normal.  Cardiovascular:     Rate and Rhythm: Normal rate and regular rhythm.     Comments: Trace bilateral lower extremity edema symmetric. Pulmonary:     Effort: Pulmonary effort is normal. No respiratory distress.     Breath sounds: Normal breath sounds.  Abdominal:     General: There is no distension.     Palpations: Abdomen is soft.     Tenderness: There is no abdominal tenderness. There is no guarding or rebound.       Comments: Small amount of yeast beneath the pannus.  No fluctuance erythema induration or streaking.  Skin intact.  Musculoskeletal:        General: Normal range of motion.     Cervical back: Normal range of motion.      Comments: No midline C/T/L spinal tenderness to palpation, no paraspinal muscle tenderness, no deformity,  crepitus, or step-off noted. No sign of injury to the neck or back.  Pelvis stable to compression bilateral without pain.  Prep range of motion and strength with all major joints of the bilateral upper extremities without pain.  Appropriate range of motion and strength of the lower extremities for age without pain.  Skin:    General: Skin is warm and dry.  Neurological:     Mental Status: She is alert.     GCS: GCS eye subscore is 4. GCS verbal subscore is 5. GCS motor subscore is 6.     Comments: Speech is clear and goal oriented, follows commands Major Cranial nerves without deficit, no facial droop Moves extremities without ataxia, coordination intact  Psychiatric:        Behavior: Behavior normal.     ED Results / Procedures / Treatments   Labs (all labs ordered are listed, but only abnormal results are displayed) Labs Reviewed  CBC WITH DIFFERENTIAL/PLATELET - Abnormal; Notable for the following components:      Result Value   RBC 3.60 (*)    Hemoglobin 10.8 (*)    HCT 32.9 (*)    All other components within normal limits  BASIC METABOLIC PANEL - Abnormal; Notable for the following components:   GFR, Estimated 57 (*)    All other components within normal limits  URINALYSIS, ROUTINE W REFLEX MICROSCOPIC  CK  CBG MONITORING, ED  TROPONIN I (HIGH SENSITIVITY)    EKG EKG Interpretation  Date/Time:  Sunday August 29 2020 13:52:31 EDT Ventricular Rate:  60 PR Interval:    QRS Duration: 160 QT Interval:  506 QTC Calculation: 506 R Axis:   -83 Text Interpretation: AV dual-paced rhythm No significant change since last tracing Confirmed by Fredia Sorrow [phone]) on 08/29/2020 1:57:04 PM   Radiology DG Chest 2 View  Result Date: 08/29/2020 CLINICAL DATA:  [place] yesterday, syncope and collapse EXAM: CHEST - 2 VIEW COMPARISON:  06/03/2019 FINDINGS: LEFT subclavian  sequential transvenous pacemaker leads project at RIGHT atrium and RIGHT ventricle. Normal heart size, mediastinal contours, and pulmonary vascularity. Atherosclerotic calcification aorta. Lungs clear. No acute infiltrate, pleural effusion or pneumothorax. Diffuse osseous demineralization IMPRESSION: No acute abnormalities. Aortic Atherosclerosis (ICD10-I70.0). Electronically Signed   By: Lavonia Dana M.D.   On: 08/29/2020 15:38   DG Pelvis 1-2 Views  Result Date: 08/29/2020 CLINICAL DATA:  Syncope and collapse yesterday EXAM: PELVIS - 1-2 VIEW COMPARISON:  05/17/2018 FINDINGS: Osseous demineralization. Hip and SI joint spaces preserved. No acute fracture, dislocation, or bone destruction. Degenerative disc disease changes at visualized lower lumbar spine. IMPRESSION: No acute osseous abnormalities. Electronically Signed   By: Lavonia Dana M.D.   On: 08/29/2020 15:39   CT Head Wo Contrast  Result Date: 08/29/2020 CLINICAL DATA:  80 year old female with acute fall. EXAM: CT HEAD WITHOUT CONTRAST CT CERVICAL SPINE WITHOUT CONTRAST TECHNIQUE: Multidetector CT imaging of the head and cervical spine was performed following the standard protocol without intravenous contrast. Multiplanar CT image reconstructions of the cervical spine were also generated. COMPARISON:  01/30/2019 head CT and prior studies FINDINGS: CT HEAD FINDINGS Brain: No evidence of acute infarction, hemorrhage, hydrocephalus, extra-axial collection or mass lesion/mass effect. Chronic small-vessel white matter ischemic changes again noted. Vascular: Carotid and vertebral atherosclerotic calcifications are noted. Skull: Normal. Negative for fracture or focal lesion. Sinuses/Orbits: No acute finding. Other: None. CT CERVICAL SPINE FINDINGS Alignment: Normal. Skull base and vertebrae: No acute fracture. No primary bone lesion or focal pathologic process. Soft tissues and  spinal canal: No prevertebral fluid or swelling. No visible canal hematoma. Disc  levels: Mild to moderate degenerative disc disease/spondylosis at C3-4 noted contributing to mild bony biforaminal narrowing. Upper chest: No acute abnormality. Other: None. IMPRESSION: 1. No evidence of acute intracranial abnormality. Chronic small-vessel white matter ischemic changes. 2. No static evidence of acute injury to the cervical spine. Electronically Signed   By: Margarette Canada M.D.   On: 08/29/2020 15:17   CT Cervical Spine Wo Contrast  Result Date: 08/29/2020 CLINICAL DATA:  80 year old female with acute fall. EXAM: CT HEAD WITHOUT CONTRAST CT CERVICAL SPINE WITHOUT CONTRAST TECHNIQUE: Multidetector CT imaging of the head and cervical spine was performed following the standard protocol without intravenous contrast. Multiplanar CT image reconstructions of the cervical spine were also generated. COMPARISON:  01/30/2019 head CT and prior studies FINDINGS: CT HEAD FINDINGS Brain: No evidence of acute infarction, hemorrhage, hydrocephalus, extra-axial collection or mass lesion/mass effect. Chronic small-vessel white matter ischemic changes again noted. Vascular: Carotid and vertebral atherosclerotic calcifications are noted. Skull: Normal. Negative for fracture or focal lesion. Sinuses/Orbits: No acute finding. Other: None. CT CERVICAL SPINE FINDINGS Alignment: Normal. Skull base and vertebrae: No acute fracture. No primary bone lesion or focal pathologic process. Soft tissues and spinal canal: No prevertebral fluid or swelling. No visible canal hematoma. Disc levels: Mild to moderate degenerative disc disease/spondylosis at C3-4 noted contributing to mild bony biforaminal narrowing. Upper chest: No acute abnormality. Other: None. IMPRESSION: 1. No evidence of acute intracranial abnormality. Chronic small-vessel white matter ischemic changes. 2. No static evidence of acute injury to the cervical spine. Electronically Signed   By: Margarette Canada M.D.   On: 08/29/2020 15:17   DG Shoulder Left  Result Date:  08/29/2020 CLINICAL DATA:  Syncope and collapse yesterday EXAM: LEFT SHOULDER - 2+ VIEW COMPARISON:  None FINDINGS: Osseous demineralization. AC joint alignment normal. Pacemaker generator and leads traverse region. No acute fracture, dislocation, or bone destruction. Visualized LEFT ribs unremarkable. IMPRESSION: No acute abnormalities. Electronically Signed   By: Lavonia Dana M.D.   On: 08/29/2020 15:40    Procedures Procedures   Medications Ordered in ED Medications  sodium chloride 0.9 % bolus 500 mL (0 mLs Intravenous Stopped 08/29/20 1622)  sodium chloride 0.9 % bolus 500 mL (500 mLs Intravenous New Bag/Given 08/29/20 1657)    ED Course  I have reviewed the triage vital signs and the nursing notes.  Pertinent labs & imaging results that were available during my care of the patient were reviewed by me and considered in my medical decision making (see chart for details).    MDM Rules/Calculators/A&P                         Additional history obtained from: 1. Nursing notes from this visit. 2. Electronic medical records.  Echocardiogram August 2020 shows ejection fraction 55-60%. 3. Patient's family. ------------ 80 year old female presented for concern of yeast infection of her stomach fold today.  Going on for about a week, she is using nystatin intermittently with out relief.  On exam there is a small amount of yeast present no evidence for significant cellulitis, no evidence of abscess.  There is no abdominal tenderness nausea vomiting fever or urinary symptoms.  The large amount of redundant stomach tissue against the lap is likely facilitating yeast growth, I encouraged patient to continue using nystatin regularly and to keep the area dry to avoid further yeast production.  I also encourage patient to  follow-up with her primary care provider for recheck this week, patient reports that she currently does not have a primary care provider so we will give her referral for 1 encouraged her  to call today to schedule a follow-up appointment.  Patient reports that she lives alone, her children come by to take care of her several times a week and they can give additional information.  At this point I called patient's primary contact, her son Reggie.  Reggie stated understanding of care plan above and then mentioned that patient had a fall yesterday.  Reports that he received a phone call from his sister that the patient had fallen and could not get up off of the floor.  He came over and found her laying with her head against a small stool he had to lift her up to get back into bed and patient was complaining of left shoulder pain at that time.  At this point during the ER visit I reassessed the patient and asked her about her fall yesterday.  Patient reports that she does not think it was a big deal yesterday, she reports that she was sitting on the bed yesterday afternoon she had stood up to go walk to the couch when she passed out.  She denies any preceding chest pain or shortness of breath. She woke up on the ground laying on her left side and called her children to come pick her up.  Shared decision making was then made with the patient she is agreeable to further work-up regarding her syncope and collapse. - I ordered, reviewed and interpreted labs which include: CBC shows baseline anemia of 10.8, no leukocytosis or thrombocytopenia. BMP shows no emergent lecture derangement, AKI or gap. CK within normal limits. CBG within normal limits. Troponin within normal limits. Urinalysis shows no evidence of infection.  EKG: AV dual-paced rhythm No significant change since last tracing Confirmed by Fredia Sorrow [phone]) on 08/29/2020 1:57:04 PM  CT head/cspine:  IMPRESSION:  1. No evidence of acute intracranial abnormality. Chronic  small-vessel white matter ischemic changes.  2. No static evidence of acute injury to the cervical spine.   CXR:  IMPRESSION:  No acute abnormalities.     Aortic Atherosclerosis (ICD10-I70.0).   DG Left Shoulder:  IMPRESSION:  No acute abnormalities.   DG Pelvis:  IMPRESSION:  No acute osseous abnormalities.  -------------------------- Initial orthostatics performed by nurse tech were likely erroneous.  I personally performed repeat orthostatic vital signs with the patient and nurse tech, no hypotension or tachycardia.  Patient denied any lightheaded/dizziness and reports she felt well throughout the trial.  Patient eating a [place] sandwich on reassessment reports that she feels well, vital signs are stable she is requesting discharge.  She reports that she lives with her granddaughter who can help take care of her additionally she has family numbers that live next door.  She feels safe with discharge which I feel is reasonable at this time.  Pacemaker interrogation report reviewed does not show any emergent findings which is in line with what the representative reported to the [facility] earlier.  I did offer patient observation admission to the hospital given her syncope at home for further work-up and possible rehab placement.  Shared decision making was made, patient is adamant about being discharged immediately.  Per patient's questions will discharge home with close PCP follow-up.  On reassessment patient is able to ambulate with minimal assist, reports that she walks normally with a walker at home.  She  denies any pain of the hips or legs on reassessment no additional imaging or work-up is indicated at this time.  Will start patient back on Lasix for mild bilateral lower extremity edema and refer to [facility] practice for close outpatient follow-up. - Discussed findings and plan of care with patient's family member Reggie as well.  He stated understanding and will have family members at home to help patient around the house.  Question was brought up about patient's right knee pain which they report has been chronic.  On examination today  patient has no right knee pain to palpation she is able to flex and extend at the knee without evidence of pain and is weightbearing on the knee.  Discussed additional imaging with patient she refused and requested discharge.  Low suspicion for fracture/dislocation at this time.  At this time there does not appear to be any evidence of an acute emergency medical condition and the patient appears stable for discharge with appropriate outpatient follow up. Diagnosis was discussed with patient who verbalizes understanding of care plan and is agreeable to discharge. I have discussed return precautions with patient who verbalizes understanding. Patient encouraged to follow-up with their PCP. All questions answered.  Patient seen and evaluated by Dr. Rogene Houston during this visit who agrees with discharge.  Note: Portions of this report may have been transcribed using voice recognition software. Every effort was made to ensure accuracy; however, inadvertent computerized transcription errors may still be present. Final Clinical Impression(s) / ED Diagnoses Final diagnoses:  Yeast dermatitis  Syncope and collapse    Rx / DC Orders ED Discharge Orders         Ordered    furosemide (LASIX) 20 MG tablet  Daily        08/29/20 1803           Deliah Boston, PA-C 08/29/20 1809    Deliah Boston, PA-C 08/29/20 1841    Fredia Sorrow, MD 09/06/20 [phone]

## 2020-08-29 NOTE — ED Provider Notes (Signed)
I provided a substantive portion of the care of this patient.  I personally performed the entirety of the history, exam and medical decision making for this encounter.     EKG Interpretation  Date/Time:  Sunday August 29 2020 13:52:31 EDT Ventricular Rate:  60 PR Interval:    QRS Duration: 160 QT Interval:  506 QTC Calculation: 506 R Axis:   -83 Text Interpretation: AV dual-paced rhythm No significant change since last tracing Confirmed by Fredia Sorrow [phone]) on 08/29/2020 1:57:04 PM  Patient seen by me along with physician assistant.  Initially based on the patient's complaint patient had come in for an open wound in her pannus area.  The family been treating with nystatin powder.  Reports been that the wounds been draining at home.  Actually no significant wound in the pannus area.  But family has other concerns.  They are worried about the fact that she is got bilateral leg swelling.  And that apparently patient had a syncopal episode yesterday unwitnessed.  Patient thinks she was on the floor for about an hour.  Physical exam here patient is alert.  Patient does have bilateral lower extremity edema with some chronic skin changes.  Wound on the abdomen without any significant signs of infection.  Patient was going to be discharged before we knew about the syncopal episode.  But based on that we have planning on doing a syncope work-up.  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  Talk to physician assistant about interrogation of the pacemaker to see if there was any arrhythmias.  Continue doing cardiac monitoring here.  Labs without any significant abnormality no leukocytosis no significant anemia.  No significant electrolyte abnormalities.  CK was not elevated at 169.  Initial troponin is 5.  X-ray without any acute abnormalities x-ray of the pelvis without any acute abnormalities.  X-ray of the left shoulder is still in process.  CT head without any acute abnormalities.  And CT  cervical spine without any acute abnormalities.  Patient most likely will be able to be discharged home.  Sounds as if patient was on Lasix in the past so may be providing her with some Lasix for restart.  Patient was followed by Dr. Buelah Manis at the [facility].  The doctor is gone into a different practice environment family seems to be confused that she has been dropped from the practice.  But I would think that the practice would have to see her in the meantime or either provide her with another primary care.  Request that the daughter give the [facility] practice a call on Monday to try to get clarification on that.  Daughter also requested that social work consult be placed to check the home environment because the patient lives by herself.     Fredia Sorrow, MD 08/29/20 [phone]

## 2020-08-29 NOTE — TOC Progression Note (Signed)
Transition of Care [facility]) - Progression Note    Patient Details  Name: Charlotte Henry MRN: [id number] Date of Birth: 1940-09-20  Transition of Care [facility] CM/SW Contact  Natasha Bence, LCSW Phone Number: 08/29/2020, 3:02 PM  Clinical Narrative:    CSW received consult for [facility] for patient. Patient's family member reported that patient does not wish to participate in [facility], but felt that patient needed. CSW notified patient's family member that CSW is not able to refer patient if patient is not agreeable. TOC to follow.         Expected Discharge Plan and Services                                                 Social Determinants of Health (SDOH) Interventions    Readmission Risk Interventions No flowsheet data found.

## 2020-08-29 NOTE — ED Triage Notes (Signed)
Pt has an open wound to her pannus area that the family had been treating with powder presumably Nystatin. Wound has been draining at home.  Pt's family also states she has trouble with her left leg they think is not related to her arthritis.

## 2020-08-29 NOTE — Discharge Instructions (Addendum)
At this time there does not appear to be the presence of an emergent medical condition, however there is always the potential for conditions to change. Please read and follow the below instructions.  Please return to the Emergency Department immediately for any new or worsening symptoms. Please be sure to follow up with your Primary Care Provider within one week regarding your visit today; please call their office to schedule an appointment even if you are feeling better for a follow-up visit. Continue using nystatin twice a day to help with your yeast.  Allowing the area to get fresh air and keeping it dry will also help with yeast infection. You may take the medication Lasix as prescribed to help with the small amount of swelling to both of your legs. Please call [facility] practice to reestablish your primary care and for follow-up visit next week for reassessment and medication management.  If you are unable to reestablish care at [facility] please call [facility] to establish a primary care provider.  Go to the nearest Emergency Department immediately if: You have fever or chills You have a very bad headache. You pass out once or more than once. You have pain in your chest, belly, or back. You have a very fast or uneven heartbeat (palpitations). It hurts to breathe. You are bleeding from your mouth or your bottom (rectum). You have black or tarry poop (stool). You have jerky movements that you cannot control (seizure). You are confused. You have trouble walking. You are very weak. You have vision problems. You have any new/concerning or worsening of symptoms.   Please read the additional information packets attached to your discharge summary.  Do not take your medicine if  develop an itchy rash, swelling in your mouth or lips, or difficulty breathing; call 911 and seek immediate emergency medical attention if this occurs.  You may review your lab  tests and imaging results in their entirety on your MyChart account.  Please discuss all results of fully with your primary care provider and other specialist at your follow-up visit.  Note: Portions of this text may have been transcribed using voice recognition software. Every effort was made to ensure accuracy; however, inadvertent computerized transcription errors may still be present.

## 2020-09-07 ENCOUNTER — Telehealth: Payer: Self-pay

## 2020-09-07 NOTE — Telephone Encounter (Signed)
Patient called to request call back from [place].- patient wouldn't give any additional information or reason for the request. Please advise at [phone].

## 2020-09-07 NOTE — Telephone Encounter (Signed)
Patient inquired about any new providers in office.    Advised that [facility] has not hired any new providers at this time.

## 2020-09-13 ENCOUNTER — Ambulatory Visit: Payer: Medicare Other | Admitting: Orthopedic Surgery

## 2020-09-20 ENCOUNTER — Telehealth: Payer: Self-pay | Admitting: *Deleted

## 2020-09-20 ENCOUNTER — Other Ambulatory Visit: Payer: Self-pay

## 2020-09-20 ENCOUNTER — Ambulatory Visit (INDEPENDENT_AMBULATORY_CARE_PROVIDER_SITE_OTHER): Payer: Medicare Other | Admitting: Nurse Practitioner

## 2020-09-20 VITALS — BP 118/72 | HR 61 | Temp 97.2°F | Ht 67.0 in | Wt 147.0 lb

## 2020-09-20 DIAGNOSIS — R300 Dysuria: Secondary | ICD-10-CM

## 2020-09-20 LAB — URINALYSIS, ROUTINE W REFLEX MICROSCOPIC
Bilirubin Urine: NEGATIVE
Glucose, UA: NEGATIVE
Hyaline Cast: NONE SEEN /LPF
Ketones, ur: NEGATIVE
Nitrite: NEGATIVE
Specific Gravity, Urine: 1.02 (ref 1.001–1.03)
Squamous Epithelial / HPF: NONE SEEN /HPF (ref <=5)
pH: 7 (ref 5.0–8.0)

## 2020-09-20 LAB — MICROSCOPIC MESSAGE

## 2020-09-20 MED ORDER — CEPHALEXIN 500 MG PO CAPS
500.0000 mg | ORAL_CAPSULE | Freq: Four times a day (QID) | ORAL | 0 refills | Status: DC
Start: 2020-09-20 — End: 2020-11-18

## 2020-09-20 NOTE — Telephone Encounter (Signed)
Received call from patient.   Reports that she is having lower abdominal pain and hematuria. States that she noted large clots in her urine on 09/19/2020, but has not seen any today.   Reports that she is weak and fatigued at baseline, but has not noted any worsening Sx.   Advised that she will need evaluation. States that she has not established with new provider at this time.   Appointment scheduled with NP.

## 2020-09-20 NOTE — Progress Notes (Signed)
Subjective:    Patient ID: Charlotte Henry, female    DOB: 1940-08-08, 80 y.o.   MRN: [id number]  HPI: Charlotte Henry is a 80 y.o. female presenting for  Chief Complaint  Patient presents with  . Hematuria    Pain at the bottom of stomach, feels like labor pain. Cannot void at the moment. Pain since yesterday, per daughter there is pus and blood in urine   HEMATURIA Yesterday, clots were round and today they are strings Duration: days - 1 day Gross hematuria: yes Microscopic hematuria: yes Dipstick hematuria: yes Duration: days  Proteinuria: yes Red cell casts: no Dark brown/cola colored urine: no Clots in urine: yes Recent urine culture: negative Dysuria/urinary frequency: yes Urinary incontinence: yes Urinary hesistancy/dribbling: yes, no dribbling Back pain:no Suprapubic pain/pressure: yes Flank pain: no Recent vigorous exercise/trauma: no Status: stable  Allergies  Allergen Reactions  . Ace Inhibitors Hives, Shortness Of Breath and Swelling  . Aspirin Other (See Comments)    Blood in stool and nose bleed  . Codeine Swelling  . Pineapple Shortness Of Breath and Swelling  . Plasticized Base [Plastibase] Hives and Swelling    No plastics  . Ultram [Tramadol] Swelling  . Adhesive [Tape] Swelling and Rash  . Naprosyn [Naproxen] Swelling  . Biaxin [Clarithromycin] Nausea And Vomiting and Other (See Comments)    Dizziness.  . Clindamycin/Lincomycin Other (See Comments)    Burning sensation throat, abdomen  . Linzess [Linaclotide]     DIARRHEA, FECAL INCONTINENCE  . Metronidazole     NAUSEA AND WEAKNESS  . Nsaids   . Penicillins     Lip swelling, Hives Has patient had a PCN reaction causing immediate rash, facial/tongue/throat swelling, SOB or lightheadedness with hypotension:YES Has patient had a PCN reaction causing severe rash involving mucus membranes or skin necrosis: NO Has patient had a PCN reaction that required hospitalization NO Has patient had a  PCN reaction occurring within the last 10 years: NO If all of the above answers are "NO", then may proceed with Cephalosporin use.  . Varenicline Tartrate Swelling  . Xarelto [Rivaroxaban]     Bleeding    Outpatient Encounter Medications as of 09/20/2020  Medication Sig  . cephALEXin (KEFLEX) 500 MG capsule Take 1 capsule (500 mg total) by mouth 4 (four) times daily.  Marland Kitchen albuterol (PROVENTIL HFA;VENTOLIN HFA) 108 (90 Base) MCG/ACT inhaler Inhale 2 puffs into the lungs every 4 (four) hours as needed for wheezing or shortness of breath.  . calcium citrate-vitamin D (CITRACAL+D) 315-200 MG-UNIT per tablet Take 1 tablet by mouth daily.  . clotrimazole (LOTRIMIN) 1 % cream Apply 1 application topically 2 (two) times daily. Beneath stomach  . Cyanocobalamin (VITAMIN B 12 PO) Take 1 tablet by mouth daily.   Marland Kitchen donepezil (ARICEPT) 5 MG tablet Take 1 tablet (5 mg total) by mouth at bedtime.  Marland Kitchen escitalopram (LEXAPRO) 10 MG tablet Take 1 tablet (10 mg total) by mouth daily.  Marland Kitchen esomeprazole (NEXIUM) 40 MG capsule TAKE 1 CAPSULE BY MOUTH DAILY BEFORE BREAKFAST.  Marland Kitchen ezetimibe (ZETIA) 10 MG tablet Take 1 tablet (10 mg total) by mouth daily.  . ferrous sulfate 325 (65 FE) MG tablet Take 325 mg by mouth daily with breakfast.  . furosemide (LASIX) 20 MG tablet Take 1 tablet (20 mg total) by mouth daily.  Marland Kitchen lidocaine (LIDODERM) 5 % Place 1 patch onto the skin daily. Remove & Discard patch within 12 hours or as directed by MD  . Multiple Vitamin (  MULTIVITAMIN WITH MINERALS) TABS tablet Take 1 tablet by mouth daily.  Marland Kitchen nystatin (MYCOSTATIN/NYSTOP) powder Apply 1 application topically 3 (three) times daily. Beneath breast and stomach  . [DISCONTINUED] cephALEXin (KEFLEX) 500 MG capsule Take 1 pill BID x 5 days   No facility-administered encounter medications on file as of 09/20/2020.    Patient Active Problem List   Diagnosis Date Noted  . Iron deficiency anemia 04/28/2020  . Cognitive change 03/25/2019  . B12  deficiency 03/24/2019  . AAA (abdominal aortic aneurysm) ([place]) 02/17/2019  . Vertigo 01/30/2019  . 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[place]) 01/10/2018  . Poor appetite 12/19/2017  . Itching 07/19/2017  . Gait instability 02/26/2017  . Symptomatic anemia 08/15/2016  . Protein-calorie malnutrition ([place]) 08/04/2016  . Helicobacter pylori gastritis 07/26/2016  . Generalized weakness 07/09/2016  . Normocytic anemia 07/03/2016  . CHB (complete heart block) ([place])   . COPD (chronic obstructive pulmonary disease) ([place])   . Tobacco use disorder 10/15/2015  . Back pain 12/30/2014  . OA (osteoarthritis) 12/30/2014  . Carotid stenosis 03/11/2014  . PVD (peripheral vascular disease) ([place]) 03/11/2014  . Colon cancer screening 02/04/2014  . Overactive bladder 12/31/2013  . Rash and nonspecific skin eruption 05/19/2013  . Numbness and tingling-bilat foot 03/05/2013  . IBS (irritable bowel syndrome) 12/30/2012  . Peripheral neuropathy 07/01/2012  . Insomnia 07/01/2012  . Chronic kidney disease (CKD), stage III (moderate) ([place]) 08/25/2010  . Cardiovascular disease 08/16/2009  . Cerebrovascular disease 08/16/2009  . Peripheral vascular disease ([place]) 08/16/2009  . Type 2 diabetes mellitus with diabetic neuropathy, unspecified ([place]) 10/26/2008  . Hyperlipidemia 10/26/2008  . Gout, unspecified 10/26/2008  . Essential hypertension 10/26/2008  . Asthmatic bronchitis 10/26/2008  . Gastroesophageal reflux disease 10/26/2008    Past Medical History:  Diagnosis Date  . 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 ASCVD (arteriosclerotic cardiovascular disease)    a. cath in 4/04,-80%LAD; 70% RCA, -> DESx2; residual 60% distal RCA; nl EF  b. Nuc 02/2016 aborted due to bradycardia. Will need to be rescheduled   . Carotid artery occlusion   . Cerebrovascular disease   . CHB (complete heart block) ([place])    a. s/p PPM on 03/24/16 with a St. Jude (serial number [id number]) pacemaker  . Chronic  kidney disease (CKD), stage III (moderate) ([facility])   . Clostridium difficile colitis 03/2013   a. 03/2013  . COPD (chronic obstructive pulmonary disease) ([place])   . Coronary disease   . Diverticulosis   . GERD (gastroesophageal reflux disease)   . Gout   . H pylori ulcer   . Hyperlipidemia    chose to stop statin, takes Zetia  . Hypertension    not currently on medication  . IBS (irritable bowel syndrome)   . PVD (peripheral vascular disease) ([place])    a. moderate to severely decreased ABI's in 8/08, sig. aortic inflow disease b. repeat ABI's in 2011 improved- mild disease on the left and moderate to severe disease on the right  . S/P placement of cardiac pacemaker    a. 03/24/16: St. Jude (serial number [id number]) pacemaker  . Symptomatic anemia 08/15/2016  . TIA (transient ischemic attack) 2012  . Tobacco abuse    a. 50 pack years continuing at 1/2 pack per day  . Tobacco use disorder 10/15/2015    Relevant past medical, surgical, family and social history reviewed and updated as indicated. Interim medical history since our last visit reviewed.  Review of Systems Per HPI unless specifically  indicated above     Objective:    BP 118/72   Pulse 61   Temp (!) 97.2 F (36.2 C)   Ht 5\' 7"  (1.702 m)   Wt 147 lb (66.7 kg)   SpO2 97%   BMI 23.02 kg/m   Wt Readings from Last 3 Encounters:  09/20/20 147 lb (66.7 kg)  08/29/20 147 lb (66.7 kg)  08/12/20 147 lb (66.7 kg)    Physical Exam Vitals and nursing note reviewed. Exam conducted with a chaperone present (AW).  Constitutional:      General: She is not in acute distress.    Appearance: Normal appearance. She is not toxic-appearing.  Abdominal:     General: Abdomen is flat. Bowel sounds are normal. There is no distension.     Palpations: Abdomen is soft. There is no mass.     Tenderness: There is no abdominal tenderness. There is no right CVA tenderness or left CVA tenderness.  Genitourinary:    General: Normal vulva.      Exam position: Lithotomy position.     Pubic Area: No rash.      Urethra: No prolapse, urethral pain, urethral swelling or urethral lesion.  Skin:    General: Skin is warm and dry.     Capillary Refill: Capillary refill takes less than 2 seconds.     Coloration: Skin is not jaundiced or pale.  Neurological:     Mental Status: She is alert and oriented to person, place, and time. Mental status is at baseline.  Psychiatric:        Mood and Affect: Mood normal.       Assessment & Plan:  1. Dysuria Acute.  Urine catheterization performed today in clinic.  No blood clots seen in this urine sample.  UA showed cloudy urine with 2+ blood 2+ protein 3+ leukocyte Estrace.  Microscopic exam revealed 20-40 white blood cells, 3-10 red blood cells, and many bacteria.  We will send urine for culture and in meantime, treat with cephalexin 500 mg 4 times daily for 5 days.  - Urinalysis, Routine w reflex microscopic - Urine Culture - cephALEXin (KEFLEX) 500 MG capsule; Take 1 capsule (500 mg total) by mouth 4 (four) times daily.  Dispense: 20 capsule; Refill: 0    Follow up plan: Return for With new PCP.

## 2020-09-22 LAB — URINE CULTURE
MICRO NUMBER:: 11781761
SPECIMEN QUALITY:: ADEQUATE

## 2020-09-22 NOTE — Progress Notes (Signed)
Chronic Care Management Pharmacy Note  09/24/2020 Name:  Charlotte Henry MRN:  [id number] DOB:  [birth year]  Subjective: Charlotte Henry is an 80 y.o. year old female who is a primary patient of Patient, No Pcp Per (Inactive).  The CCM team was consulted for assistance with disease management and care coordination needs.    Engaged with patient by telephone for follow up visit in response to provider referral for pharmacy case management and/or care coordination services.   Consent to Services:  The patient was given the following information about Chronic Care Management services today, agreed to services, and gave verbal consent: 1. CCM service includes personalized support from designated clinical staff supervised by the primary care provider, including individualized plan of care and coordination with other care providers 2. 24/7 contact phone numbers for assistance for urgent and routine care needs. 3. Service will only be billed when office clinical staff spend 20 minutes or more in a month to coordinate care. 4. Only one practitioner may furnish and bill the service in a calendar month. 5.The patient may stop CCM services at any time (effective at the end of the month) by phone call to the office staff. 6. The patient will be responsible for cost sharing (co-pay) of up to 20% of the service fee (after annual deductible is met). Patient agreed to services and consent obtained.  Patient Care Team: Patient, No Pcp Per (Inactive) as PCP - General (General Practice) [name], MD as PCP - Electrophysiology (Cardiology) Herminio Commons, MD (Inactive) as PCP - Cardiology (Cardiology) Angelia Mould, MD (Vascular Surgery) Rothbart, Cristopher Estimable, MD (Inactive) as Consulting Physician (Cardiology) Edythe Clarity, [facility] as Pharmacist (Pharmacist)  Recent office visits: 09/20/20 Charlotte Henry) - urine cath, empiric treatment with Keflex 525m qid and send off for urine  culture  Recent consult visits: None since last CCM visit  Hospital visits: 08/29/20 (ED) - patient complains of open wound, also reports syncopal episode unwitnessed.  Insignificant workup patient discharged.  Objective:  Lab Results  Component Value Date   CREATININE 0.99 08/29/2020   BUN 18 08/29/2020   GFRNONAA 57 (L) 08/29/2020   GFRAA >60 12/04/2019   NA 138 08/29/2020   K 4.0 08/29/2020   CALCIUM 9.1 08/29/2020   CO2 23 08/29/2020   GLUCOSE 95 08/29/2020    Lab Results  Component Value Date/Time   HGBA1C 5.7 (H) 12/16/2019 03:50 PM   HGBA1C 5.9 (H) 03/24/2019 04:33 PM   MICROALBUR 0.5 03/24/2019 04:46 PM   MICROALBUR 0.81 09/30/2012 01:52 PM    Last diabetic Eye exam: No results found for: HMDIABEYEEXA  Last diabetic Foot exam: No results found for: HMDIABFOOTEX   Lab Results  Component Value Date   CHOL 182 12/16/2019   HDL 40 (L) 12/16/2019   LDLCALC 117 (H) 12/16/2019   TRIG 140 12/16/2019   CHOLHDL 4.6 12/16/2019    Hepatic Function Latest Ref Rng & Units 03/31/2020 12/04/2019 06/03/2019  Total Protein 6.5 - 8.1 g/dL 7.3 7.4 7.4  Albumin 3.5 - 5.0 g/dL 3.9 4.3 3.9  AST 15 - 41 U/L _0 ALT 0 - 44 U/L _1 Alk Phosphatase 38 - 126 U/L 49 50 52  Total Bilirubin 0.3 - 1.2 mg/dL 0.4 0.6 0.7  Bilirubin, Direct 0.0 - 0.2 mg/dL - - -    Lab Results  Component Value Date/Time   TSH 1.518 01/30/2019 06:26 PM   TSH 1.82 10/22/2017 02:04 PM  TSH 1.718 08/02/2016 05:05 PM   TSH 1.19 07/03/2016 03:19 PM    CBC Latest Ref Rng & Units 08/29/2020 03/31/2020 12/04/2019  WBC 4.0 - 10.5 K/uL 7.0 5.8 6.4  Hemoglobin 12.0 - 15.0 g/dL 10.8(L) 10.0(L) 10.5(L)  Hematocrit 36.0 - 46.0 % 32.9(L) 30.1(L) 32.4(L)  Platelets 150 - 400 K/uL 244 202 226    No results found for: VD25OH  Clinical ASCVD: No  The ASCVD Risk score Mikey Bussing DC Jr., et al., 2013) failed to calculate for the following reasons:   The 2013 ASCVD risk score is only valid for ages 80 to 37     Depression screen PHQ 2/9 06/28/2018 12/12/2017 02/26/2017  Decreased Interest 3 0 2  Down, Depressed, Hopeless 3 0 2  PHQ - 2 Score 6 0 4  Altered sleeping 0 - 1  Tired, decreased energy 3 - 3  Change in appetite 3 - 3  Feeling bad or failure about yourself  2 - 2  Trouble concentrating 3 - 3  Moving slowly or fidgety/restless 0 - 1  Suicidal thoughts 0 - -  PHQ-9 Score 17 - 17  Difficult doing work/chores Very difficult - Not difficult at all  Some recent data might be hidden    Social History   Tobacco Use  Smoking Status Current Every Day Smoker  . Packs/day: 1.00  . Years: 50.00  . Pack years: 50.00  . Types: Cigarettes  . Start date: 02/02/1966  Smokeless Tobacco Never Used  Tobacco Comment   1/2 pack daily   BP Readings from Last 3 Encounters:  09/20/20 118/72  08/29/20 (!) 152/62  08/12/20 (!) 158/77   Pulse Readings from Last 3 Encounters:  09/20/20 61  08/29/20 82  08/12/20 60   Wt Readings from Last 3 Encounters:  09/20/20 147 lb (66.7 kg)  08/29/20 147 lb (66.7 kg)  08/12/20 147 lb (66.7 kg)   BMI Readings from Last 3 Encounters:  09/20/20 23.02 kg/m  08/29/20 23.02 kg/m  08/12/20 23.02 kg/m    Assessment/Interventions: Review of patient past medical history, allergies, medications, health status, including review of consultants reports, laboratory and other test data, was performed as part of comprehensive evaluation and provision of chronic care management services.   SDOH:  (Social Determinants of Health) assessments and interventions performed: Yes   Financial Resource Strain: Not on file    SDOH Screenings   Alcohol Screen: Not on file  Depression (ASN0-5): Not on file  Financial Resource Strain: Not on file  Food Insecurity: Not on file  Housing: Not on file  Physical Activity: Not on file  Social Connections: Not on file  Stress: Not on file  Tobacco Use: High Risk  . Smoking Tobacco Use: Current Every Day Smoker  . Smokeless  Tobacco Use: Never Used  Transportation Needs: Not on file    CCM Care Plan  Allergies  Allergen Reactions  . Ace Inhibitors Hives, Shortness Of Breath and Swelling  . Aspirin Other (See Comments)    Blood in stool and nose bleed  . Codeine Swelling  . Pineapple Shortness Of Breath and Swelling  . Plasticized Base [Plastibase] Hives and Swelling    No plastics  . Ultram [Tramadol] Swelling  . Adhesive [Tape] Swelling and Rash  . Naprosyn [Naproxen] Swelling  . Biaxin [Clarithromycin] Nausea And Vomiting and Other (See Comments)    Dizziness.  . Clindamycin/Lincomycin Other (See Comments)    Burning sensation throat, abdomen  . Linzess [Linaclotide]     DIARRHEA,  FECAL INCONTINENCE  . Metronidazole     NAUSEA AND WEAKNESS  . Nsaids   . Penicillins     Lip swelling, Hives Has patient had a PCN reaction causing immediate rash, facial/tongue/throat swelling, SOB or lightheadedness with hypotension:YES Has patient had a PCN reaction causing severe rash involving mucus membranes or skin necrosis: NO Has patient had a PCN reaction that required hospitalization NO Has patient had a PCN reaction occurring within the last 10 years: NO If all of the above answers are "NO", then may proceed with Cephalosporin use.  . Varenicline Tartrate Swelling  . Xarelto [Rivaroxaban]     Bleeding    Medications Reviewed Today    Reviewed by Edythe Clarity, [facility] (Pharmacist) on 09/24/20 at 1249  Med List Status: <None>  Medication Order Taking? Sig Documenting Provider Last Dose Status Informant  albuterol (PROVENTIL HFA;VENTOLIN HFA) 108 (90 Base) MCG/ACT inhaler [id number] No Inhale 2 puffs into the lungs every 4 (four) hours as needed for wheezing or shortness of breath. Alycia Rossetti, MD Taking Active Self  calcium citrate-vitamin D (CITRACAL+D) 315-200 MG-UNIT per tablet [id number] No Take 1 tablet by mouth daily. [provider] Taking Active Self  cephALEXin (KEFLEX) 500 MG  capsule [id number]  Take 1 capsule (500 mg total) by mouth 4 (four) times daily. Eulogio Bear, NP  Active   clotrimazole (LOTRIMIN) 1 % cream [id number] No Apply 1 application topically 2 (two) times daily. Beneath stomach [place], Modena Nunnery, MD Taking Active   Cyanocobalamin (VITAMIN B 12 PO) [id number] No Take 1 tablet by mouth daily.  [provider] Taking Active Self  donepezil (ARICEPT) 5 MG tablet [id number] No Take 1 tablet (5 mg total) by mouth at bedtime. [place], Modena Nunnery, MD Taking Active   escitalopram (LEXAPRO) 10 MG tablet [id number] No Take 1 tablet (10 mg total) by mouth daily. [place], Modena Nunnery, MD Taking Active   esomeprazole (NEXIUM) 40 MG capsule [id number] No TAKE 1 CAPSULE BY MOUTH DAILY BEFORE BREAKFAST. Derek Jack, MD Taking Active   ezetimibe (ZETIA) 10 MG tablet [id number] No Take 1 tablet (10 mg total) by mouth daily. [place], Modena Nunnery, MD Taking Active Self  ferrous sulfate 325 (65 FE) MG tablet [id number] No Take 325 mg by mouth daily with breakfast. [provider] Taking Active Self  furosemide (LASIX) 20 MG tablet [id number]  Take 1 tablet (20 mg total) by mouth daily. Nuala Alpha A, PA-C  Active   lidocaine (LIDODERM) 5 % [id number] No Place 1 patch onto the skin daily. Remove & Discard patch within 12 hours or as directed by MD Alycia Rossetti, MD Taking Active   Multiple Vitamin (MULTIVITAMIN WITH MINERALS) TABS tablet [id number] No Take 1 tablet by mouth daily. Murlean Iba, MD Taking Active Self  nystatin (MYCOSTATIN/NYSTOP) powder [id number] No Apply 1 application topically 3 (three) times daily. Beneath breast and stomach , Modena Nunnery, MD Taking Active           Patient Active Problem List   Diagnosis Date Noted  . Iron deficiency anemia 04/28/2020  . Cognitive change 03/25/2019  . B12 deficiency 03/24/2019  . AAA (abdominal aortic aneurysm) ([place]) 02/17/2019  . Vertigo 01/30/2019  . Malignant neoplasm of upper  lobe of right lung ([place]) 01/10/2018  . Poor appetite 12/19/2017  . Itching 07/19/2017  . Gait instability 02/26/2017  . Symptomatic anemia 08/15/2016  . Protein-calorie malnutrition ([place]) 08/04/2016  . Helicobacter pylori gastritis 07/26/2016  . Generalized weakness 07/09/2016  .  Normocytic anemia 07/03/2016  . CHB (complete heart block) ([place])   . COPD (chronic obstructive pulmonary disease) ([place])   . Tobacco use disorder 10/15/2015  . Back pain 12/30/2014  . OA (osteoarthritis) 12/30/2014  . Carotid stenosis 03/11/2014  . PVD (peripheral vascular disease) ([place]) 03/11/2014  . Colon cancer screening 02/04/2014  . Overactive bladder 12/31/2013  . Rash and nonspecific skin eruption 05/19/2013  . Numbness and tingling-bilat foot 03/05/2013  . IBS (irritable bowel syndrome) 12/30/2012  . Peripheral neuropathy 07/01/2012  . Insomnia 07/01/2012  . Chronic kidney disease (CKD), stage III (moderate) ([place]) 08/25/2010  . Cardiovascular disease 08/16/2009  . Cerebrovascular disease 08/16/2009  . Peripheral vascular disease ([place]) 08/16/2009  . Type 2 diabetes mellitus with diabetic neuropathy, unspecified ([place]) 10/26/2008  . Hyperlipidemia 10/26/2008  . Gout, unspecified 10/26/2008  . Essential hypertension 10/26/2008  . Asthmatic bronchitis 10/26/2008  . Gastroesophageal reflux disease 10/26/2008    Immunization History  Administered Date(s) Administered  . Fluad Quad(high Dose 65+) 02/11/2019  . Influenza Split 03/05/2012  . Influenza Whole 04/18/2013  . Influenza, High Dose Seasonal PF 03/27/2018  . Influenza, Seasonal, Injecte, Preservative Fre 03/27/2018  . Influenza,inj,Quad PF,6+ Mos 03/25/2016, 03/08/2017  . Influenza-Unspecified 03/12/2014, 03/17/2015  . Pneumococcal Conjugate-13 05/19/2013    Conditions to be addressed/monitored:  HTN, COPD, GERD, Type II DM w. CKD and neuropathy, HLD, Insomnia.   Care Plan : General Pharmacy (Adult)  Updates made by Edythe Clarity, RPH since 09/24/2020 12:00 AM    Problem: HTN, COPD, HLD, DM   Priority: High  Onset Date: 09/24/2020    Long-Range Goal: Patient-Specific Goal   Start Date: 09/24/2020  Expected End Date: 03/26/2021  This Visit's Progress: On track  Priority: High  Note:   Current Barriers:  . Unable to achieve control of lipids   Pharmacist Clinical Goal(s):  Marland Kitchen Patient will achieve adherence to monitoring guidelines and medication adherence to achieve therapeutic efficacy . achieve control of lipids as evidenced by lipid panel . contact provider office for questions/concerns as evidenced notation of same in electronic health record through collaboration with PharmD and provider.   Interventions: . 1:1 collaboration with Patient, No Pcp Per (Inactive) regarding development and update of comprehensive plan of care as evidenced by provider attestation and co-signature . Inter-disciplinary care team collaboration (see longitudinal plan of care) . Comprehensive medication review performed; medication list updated in electronic medical record  Hypertension (BP goal <130/80) -Controlled, based on last in office reading -Current treatment: . None noted -Medications previously tried: ACEIs (hives) -Current home readings: reports WNL, no specific readings available -Denies hypotensive/hypertensive symptoms -Educated on BP goals and benefits of medications for prevention of heart attack, stroke and kidney damage; Importance of home blood pressure monitoring; Symptoms of hypotension and importance of maintaining adequate hydration; -Counseled to monitor BP at home daily, document, and provide log at future appointments -Patient was sleeping when I called so unable to assess very much, however encouraged her to continue to monitor BP.  -She did deny any recent dizzy spells since her episode of syncope and ED visit on 3/27. -Recommended continue current management strategy  Hyperlipidemia: (LDL goal <  70) -Not ideally controlled -Current treatment: . Zetia 77m -Medications previously tried:statins (patient stopped on her own) -Educated on Cholesterol goals;  Benefits of statin for ASCVD risk reduction; Importance of limiting foods high in cholesterol; Exercise goal of 150 minutes per week; -Recommended to continue current medication  -Recommend repeat lipid panel, re-consider statin if LDL  still elevated  Diabetes w/ CKD and neuropathy (A1c goal <6.5%) -Controlled -Current medications: . None noted -Medications previously tried: none noted  -Current home glucose readings . fasting glucose: N/A . post prandial glucose: N/A -Denies hypoglycemic/hyperglycemic symptoms -Current exercise: minimal -Educated on A1c and blood sugar goals; Prevention and management of hypoglycemic episodes; Benefits of routine self-monitoring of blood sugar; -Counseled to check feet daily and get yearly eye exams -Recommended continue current management strategy  COPD (Goal: control symptoms and prevent exacerbations) -Controlled -Current treatment  . Albuterol HFA 33mg prn -Medications previously tried: none   -Exacerbations requiring treatment in last 6 months: none -Patient denies consistent use of maintenance inhaler -Frequency of rescue inhaler use: infrequent -Patient does not have maintenance inhaler -Counseled on When to use rescue inhaler Differences between maintenance and rescue inhalers  -She denies any recent episodes of SOB. -Recommended to continue current medication   Patient Goals/Self-Care Activities . Patient will:  - take medications as prescribed check blood pressure daily, document, and provide at future appointments  Follow Up Plan: The care management team will reach out to the patient again over the next 90 days.        Medication Assistance: None required.  Patient affirms current coverage meets needs.  [facility] [id number]: [phone]: [phone] Uses pill box? Yes Pt endorses 100% compliance  We discussed: Benefits of medication synchronization, packaging and delivery as well as enhanced pharmacist oversight with [employer]. Patient decided to: Continue current medication management strategy  Care Plan and Follow Up Patient Decision:  Patient agrees to Care Plan and Follow-up.  Plan: The care management team will reach out to the patient again over the next 90 days.  CBeverly Milch PharmD Clinical Pharmacist [phone]

## 2020-09-24 ENCOUNTER — Ambulatory Visit (INDEPENDENT_AMBULATORY_CARE_PROVIDER_SITE_OTHER): Payer: Medicare Other | Admitting: Pharmacist

## 2020-09-24 DIAGNOSIS — E1159 Type 2 diabetes mellitus with other circulatory complications: Secondary | ICD-10-CM | POA: Diagnosis not present

## 2020-09-24 DIAGNOSIS — I10 Essential (primary) hypertension: Secondary | ICD-10-CM

## 2020-09-24 DIAGNOSIS — J410 Simple chronic bronchitis: Secondary | ICD-10-CM

## 2020-09-24 DIAGNOSIS — E7800 Pure hypercholesterolemia, unspecified: Secondary | ICD-10-CM | POA: Diagnosis not present

## 2020-09-24 NOTE — Patient Instructions (Addendum)
Visit Information  Goals Addressed            This Visit's Progress   . Track and Manage My Blood Pressure-Hypertension       Timeframe:  Long-Range Goal Priority:  High Start Date:   09/24/20                          Expected End Date:   03/26/21                    Follow Up Date 01/02/21   - check blood pressure daily - choose a place to take my blood pressure (home, clinic or office, retail store) - write blood pressure results in a log or diary    Why is this important?    You won't feel high blood pressure, but it can still hurt your blood vessels.   High blood pressure can cause heart or kidney problems. It can also cause a stroke.   Making lifestyle changes like losing a little weight or eating less salt will help.   Checking your blood pressure at home and at different times of the day can help to control blood pressure.   If the doctor prescribes medicine remember to take it the way the doctor ordered.   Call the office if you cannot afford the medicine or if there are questions about it.     Notes:       Patient Care Plan: General Pharmacy (Adult)    Problem Identified: HTN, COPD, HLD, DM   Priority: High  Onset Date: 09/24/2020    Long-Range Goal: Patient-Specific Goal   Start Date: 09/24/2020  Expected End Date: 03/26/2021  This Visit's Progress: On track  Priority: High  Note:   Current Barriers:  . Unable to achieve control of lipids   Pharmacist Clinical Goal(s):  Marland Kitchen Patient will achieve adherence to monitoring guidelines and medication adherence to achieve therapeutic efficacy . achieve control of lipids as evidenced by lipid panel . contact provider office for questions/concerns as evidenced notation of same in electronic health record through collaboration with PharmD and provider.   Interventions: . 1:1 collaboration with Patient, No Pcp Per (Inactive) regarding development and update of comprehensive plan of care as evidenced by provider  attestation and co-signature . Inter-disciplinary care team collaboration (see longitudinal plan of care) . Comprehensive medication review performed; medication list updated in electronic medical record  Hypertension (BP goal <130/80) -Controlled, based on last in office reading -Current treatment: . None noted -Medications previously tried: ACEIs (hives) -Current home readings: reports WNL, no specific readings available -Denies hypotensive/hypertensive symptoms -Educated on BP goals and benefits of medications for prevention of heart attack, stroke and kidney damage; Importance of home blood pressure monitoring; Symptoms of hypotension and importance of maintaining adequate hydration; -Counseled to monitor BP at home daily, document, and provide log at future appointments -Patient was sleeping when I called so unable to assess very much, however encouraged her to continue to monitor BP.  -She did deny any recent dizzy spells since her episode of syncope and ED visit on 3/27. -Recommended continue current management strategy  Hyperlipidemia: (LDL goal < 70) -Not ideally controlled -Current treatment: . 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 None noted -Medications previously tried: none noted  -Current home glucose readings . fasting glucose: N/A . post prandial glucose: N/A -Denies hypoglycemic/hyperglycemic symptoms -Current exercise: minimal -Educated on A1c and blood sugar goals; Prevention and management of hypoglycemic episodes; Benefits of routine self-monitoring  of blood sugar; -Counseled to check feet daily and get yearly eye exams -Recommended continue current management strategy  COPD (Goal: control symptoms and prevent exacerbations) -Controlled -Current treatment  . Albuterol HFA 80mcg prn -Medications previously tried: none   -Exacerbations requiring treatment in last 6 months: none -Patient denies consistent use of maintenance inhaler -Frequency of rescue inhaler use: infrequent -Patient does not have maintenance inhaler -Counseled on When to use rescue inhaler Differences between maintenance and rescue inhalers  -She denies any recent episodes of SOB. -Recommended to continue current medication   Patient Goals/Self-Care Activities . Patient will:  - take medications as prescribed check blood pressure daily, document, and provide at future appointments  Follow Up Plan: The care management team will reach out to the patient again over the next 90 days.        The patient verbalized understanding of instructions, educational materials, and care plan provided today and agreed to receive a mailed copy of patient instructions, educational materials, and care plan.  Telephone follow up appointment with pharmacy team member scheduled for: 79 days  [facility]  High Cholesterol  High cholesterol is a condition in which the blood has high levels of a white, waxy substance similar to fat (cholesterol). The liver makes all the cholesterol that the body needs. The human body needs small amounts of cholesterol to help build cells. A person gets extra or excess cholesterol from the food that he or she eats. The blood carries cholesterol from the liver to the rest of the body. If you have high cholesterol, deposits (plaques) may build up on the walls of your arteries. Arteries are the blood vessels that carry blood away from your heart. These plaques make the arteries narrow and stiff. Cholesterol plaques increase your risk for  heart attack and stroke. Work with your health care provider to keep your cholesterol levels in a healthy range. What increases the risk? The following factors may make you more likely to develop this condition:  Eating foods that are high in animal fat (saturated fat) or cholesterol.  Being overweight.  Not getting enough exercise.  A family history of high cholesterol (familial hypercholesterolemia).  Use of tobacco products.  Having diabetes. What are the signs or symptoms? There are no symptoms of this condition. How is this diagnosed? This condition may be diagnosed based on the results of a blood test.  If you are older than 81 years of age, your health care provider may check your cholesterol levels every 4-6 years.  You may be checked more often if you have high cholesterol or other risk factors for heart disease. The blood test for cholesterol measures:  "Bad" cholesterol, or LDL cholesterol. This is the main type of cholesterol that causes heart disease. The desired level is less than 100 mg/dL.  "Good" cholesterol, or HDL cholesterol. HDL helps protect against heart disease by cleaning the arteries and carrying the LDL to the liver for processing. The desired level for HDL is 60 mg/dL or higher.  Triglycerides. These are fats that your body can store or burn for energy. The  desired level is less than 150 mg/dL.  Total cholesterol. This measures the total amount of cholesterol in your blood and includes LDL, HDL, and triglycerides. The desired level is less than 200 mg/dL. How is this treated? This condition may be treated with:  Diet changes. You may be asked to eat foods that have more fiber and less saturated fats or added sugar.  Lifestyle changes. These may include regular exercise, maintaining a healthy weight, and quitting use of tobacco products.  Medicines. These are given when diet and lifestyle changes have not worked. You may be prescribed a statin  medicine to help lower your cholesterol levels. Follow these instructions at home: Eating and drinking  Eat a healthy, balanced diet. This diet includes: ? Daily servings of a variety of fresh, frozen, or canned fruits and vegetables. ? Daily servings of whole grain foods that are rich in fiber. ? Foods that are low in saturated fats and trans fats. These include poultry and fish without skin, lean cuts of meat, and low-fat dairy products. ? A variety of fish, especially oily fish that contain omega-3 fatty acids. Aim to eat fish at least 2 times a week.  Avoid foods and drinks that have added sugar.  Use healthy cooking methods, such as roasting, grilling, broiling, baking, poaching, steaming, and stir-frying. Do not fry your food except for stir-frying.   Lifestyle  Get regular exercise. Aim to exercise for a total of 150 minutes a week. Increase your activity level by doing activities such as gardening, walking, and taking the stairs.  Do not use any products that contain nicotine or tobacco, such as cigarettes, e-cigarettes, and chewing tobacco. If you need help quitting, ask your health care provider.   General instructions  Take over-the-counter and prescription medicines only as told by your health care provider.  Keep all follow-up visits as told by your health care provider. This is important. Where to find more information  American Heart Association: www.heart.org  National Heart, Lung, and Blood Institute: https://wilson-eaton.com/ Contact a health care provider if:  You have trouble achieving or maintaining a healthy diet or weight.  You are starting an exercise program.  You are unable to stop smoking. Get help right away if:  You have chest pain.  You have trouble breathing.  You have any symptoms of a stroke. "BE FAST" is an easy way to remember the main warning signs of a stroke: ? B - Balance. Signs are dizziness, sudden trouble walking, or loss of balance. ? E -  Eyes. Signs are trouble seeing or a sudden change in vision. ? F - Face. Signs are sudden weakness or numbness of the face, or the face or eyelid drooping on one side. ? A - Arms. Signs are weakness or numbness in an arm. This happens suddenly and usually on one side of the body. ? S - Speech. Signs are sudden trouble speaking, slurred speech, or trouble understanding what people say. ? T - Time. Time to call emergency services. Write down what time symptoms started.  You have other signs of a stroke, such as: ? A sudden, severe headache with no known cause. ? Nausea or vomiting. ? Seizure. These symptoms may represent a serious problem that is an emergency. Do not wait to see if the symptoms will go away. Get medical help right away. Call your local emergency services (911 in the [place] Do not drive yourself to the hospital. Summary  Cholesterol plaques increase your risk for heart attack  and stroke. Work with your health care provider to keep your cholesterol levels in a healthy range.  Eat a healthy, balanced diet, get regular exercise, and maintain a healthy weight.  Do not use any products that contain nicotine or tobacco, such as cigarettes, e-cigarettes, and chewing tobacco.  Get help right away if you have any symptoms of a stroke. This information is not intended to replace advice given to you by your health care provider. Make sure you discuss any questions you have with your health care provider. Document Revised: 04/21/2019 Document Reviewed: 04/21/2019 Elsevier Patient Education  2021 [employer].

## 2020-10-07 ENCOUNTER — Ambulatory Visit: Payer: Medicare Other | Admitting: Orthopedic Surgery

## 2020-10-11 ENCOUNTER — Ambulatory Visit (INDEPENDENT_AMBULATORY_CARE_PROVIDER_SITE_OTHER): Payer: Medicare Other

## 2020-10-11 DIAGNOSIS — I442 Atrioventricular block, complete: Secondary | ICD-10-CM | POA: Diagnosis not present

## 2020-10-12 LAB — CUP PACEART REMOTE DEVICE CHECK
Battery Remaining Longevity: 107 mo
Battery Remaining Percentage: 95.5 %
Battery Voltage: 2.98 V
Brady Statistic AP VP Percent: 66 %
Brady Statistic AP VS Percent: 1 %
Brady Statistic AS VP Percent: 34 %
Brady Statistic AS VS Percent: 1 %
Brady Statistic RA Percent Paced: 64 %
Brady Statistic RV Percent Paced: 99 %
Date Time Interrogation Session: 20220509020017
Implantable Lead Implant Date: 20171020
Implantable Lead Implant Date: 20171020
Implantable Lead Location: 753859
Implantable Lead Location: 753860
Implantable Pulse Generator Implant Date: 20171020
Lead Channel Impedance Value: 410 Ohm
Lead Channel Impedance Value: 410 Ohm
Lead Channel Pacing Threshold Amplitude: 0.75 V
Lead Channel Pacing Threshold Amplitude: 1 V
Lead Channel Pacing Threshold Pulse Width: 0.4 ms
Lead Channel Pacing Threshold Pulse Width: 0.4 ms
Lead Channel Sensing Intrinsic Amplitude: 1.5 mV
Lead Channel Sensing Intrinsic Amplitude: 4.6 mV
Lead Channel Setting Pacing Amplitude: 2 V
Lead Channel Setting Pacing Amplitude: 2.5 V
Lead Channel Setting Pacing Pulse Width: 0.4 ms
Lead Channel Setting Sensing Sensitivity: 3.5 mV
Pulse Gen Model: 2272
Pulse Gen Serial Number: 3180497

## 2020-10-14 ENCOUNTER — Ambulatory Visit: Payer: Medicare Other | Admitting: Internal Medicine

## 2020-10-18 ENCOUNTER — Ambulatory Visit: Payer: Medicare Other | Admitting: Orthopedic Surgery

## 2020-10-25 ENCOUNTER — Other Ambulatory Visit: Payer: Self-pay

## 2020-10-25 ENCOUNTER — Inpatient Hospital Stay (HOSPITAL_COMMUNITY): Payer: Medicare Other | Attending: Hematology

## 2020-10-25 ENCOUNTER — Ambulatory Visit (HOSPITAL_COMMUNITY)
Admission: RE | Admit: 2020-10-25 | Discharge: 2020-10-25 | Disposition: A | Discharge status: Home / Self Care | Payer: Medicare Other | Source: Ambulatory Visit | Attending: Hematology | Admitting: Hematology

## 2020-10-25 DIAGNOSIS — D649 Anemia, unspecified: Secondary | ICD-10-CM | POA: Insufficient documentation

## 2020-10-25 DIAGNOSIS — D508 Other iron deficiency anemias: Secondary | ICD-10-CM

## 2020-10-25 DIAGNOSIS — C3411 Malignant neoplasm of upper lobe, right bronchus or lung: Secondary | ICD-10-CM | POA: Insufficient documentation

## 2020-10-25 LAB — CBC WITH DIFFERENTIAL/PLATELET
Abs Immature Granulocytes: 0.02 10*3/uL (ref 0.00–0.07)
Basophils Absolute: 0.1 10*3/uL (ref 0.0–0.1)
Basophils Relative: 1 %
Eosinophils Absolute: 0.2 10*3/uL (ref 0.0–0.5)
Eosinophils Relative: 3 %
HCT: 31.2 % — ABNORMAL LOW (ref 36.0–46.0)
Hemoglobin: 10.1 g/dL — ABNORMAL LOW (ref 12.0–15.0)
Immature Granulocytes: 0 %
Lymphocytes Relative: 30 %
Lymphs Abs: 2.2 10*3/uL (ref 0.7–4.0)
MCH: 30.2 pg (ref 26.0–34.0)
MCHC: 32.4 g/dL (ref 30.0–36.0)
MCV: 93.4 fL (ref 80.0–100.0)
Monocytes Absolute: 0.8 10*3/uL (ref 0.1–1.0)
Monocytes Relative: 11 %
Neutro Abs: 4 10*3/uL (ref 1.7–7.7)
Neutrophils Relative %: 55 %
Platelets: 222 10*3/uL (ref 150–400)
RBC: 3.34 MIL/uL — ABNORMAL LOW (ref 3.87–5.11)
RDW: 13.7 % (ref 11.5–15.5)
WBC: 7.3 10*3/uL (ref 4.0–10.5)
nRBC: 0 % (ref 0.0–0.2)

## 2020-10-25 LAB — COMPREHENSIVE METABOLIC PANEL
ALT: 11 U/L (ref 0–44)
AST: 21 U/L (ref 15–41)
Albumin: 4 g/dL (ref 3.5–5.0)
Alkaline Phosphatase: 60 U/L (ref 38–126)
Anion gap: 6 (ref 5–15)
BUN: 27 mg/dL — ABNORMAL HIGH (ref 8–23)
CO2: 25 mmol/L (ref 22–32)
Calcium: 9.2 mg/dL (ref 8.9–10.3)
Chloride: 108 mmol/L (ref 98–111)
Creatinine, Ser: 1.21 mg/dL — ABNORMAL HIGH (ref 0.44–1.00)
GFR, Estimated: 45 mL/min — ABNORMAL LOW (ref >60)
Glucose, Bld: 101 mg/dL — ABNORMAL HIGH (ref 70–99)
Potassium: 4.3 mmol/L (ref 3.5–5.1)
Sodium: 139 mmol/L (ref 135–145)
Total Bilirubin: 0.4 mg/dL (ref 0.3–1.2)
Total Protein: 7.4 g/dL (ref 6.5–8.1)

## 2020-10-25 LAB — IRON AND TIBC
Iron: 32 ug/dL (ref 28–170)
Saturation Ratios: 17 % (ref 10.4–31.8)
TIBC: 192 ug/dL — ABNORMAL LOW (ref 250–450)
UIBC: 160 ug/dL

## 2020-10-25 LAB — FERRITIN: Ferritin: 423 ng/mL — ABNORMAL HIGH (ref 11–307)

## 2020-10-26 ENCOUNTER — Ambulatory Visit: Payer: Medicare Other | Admitting: Internal Medicine

## 2020-10-26 NOTE — Progress Notes (Shared)
[place], [place] [address]   CLINIC:  Medical Oncology/Hematology  PCP:  Patient, No Pcp Per (Inactive) None  None  REASON FOR VISIT:  Follow-up for stage I right lung cancer and normocytic anemia  PRIOR THERAPY:  1. intermittent feraheme (last infusion 12/14/2016) 2. 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, a 80 y.o. female, returns for routine follow-up for her stage I right lung cancer and normocytic anemia. Charlotte Henry was last seen on 09/01/2019.  Lastest colonoscopy 08/04/2016.   REVIEW OF SYSTEMS:  Review of Systems  All other systems reviewed and are negative.   PAST MEDICAL/SURGICAL HISTORY:  Past Medical History:  Diagnosis Date  . 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 ASCVD (arteriosclerotic cardiovascular disease)    a. cath in 4/04,-80%LAD; 70% RCA, -> DESx2; residual 60% distal RCA; nl EF  b. Nuc 02/2016 aborted due to bradycardia. Will need to be rescheduled   . Carotid artery occlusion   . Cerebrovascular disease   . CHB (complete heart block) ([place])    a. s/p PPM on 03/24/16 with a St. Jude (serial number [id number]) pacemaker  . Chronic kidney disease (CKD), stage III (moderate) ([facility])   . Clostridium difficile colitis 03/2013   a. 03/2013  . COPD (chronic obstructive pulmonary disease) ([place])   . Coronary disease   . Diverticulosis   . GERD (gastroesophageal reflux disease)   . Gout   . H pylori ulcer   . Hyperlipidemia    chose to stop statin, takes Zetia  . Hypertension    not currently on medication  . IBS (irritable bowel syndrome)   . PVD (peripheral vascular disease) ([place])    a. moderate to severely decreased ABI's in 8/08, sig. aortic inflow disease b. repeat ABI's in 2011 improved- mild disease on the left and moderate to severe disease on the right  . S/P placement of cardiac pacemaker    a. 03/24/16:  St. Jude (serial number [id number]) pacemaker  . Symptomatic anemia 08/15/2016  . TIA (transient ischemic attack) 2012  . Tobacco abuse    a. 50 pack years continuing at 1/2 pack per day  . Tobacco use disorder 10/15/2015   Past Surgical History:  Procedure Laterality Date  . ABDOMINAL HYSTERECTOMY    . carpel tunnel release     bilateral  . CHOLECYSTECTOMY    . COLONOSCOPY  2009  . COLONOSCOPY N/A 08/04/2016   incomplete due to prep.   . COLONOSCOPY N/A 08/05/2016   Dr. Oneida Alar: moderately redundant rectosigmoid and sigmoid, normal TI, single large-mouthed diverticulum in hepatic flexure, external hemorrhoids, path negative for microscopic colitis   . ENTEROSCOPY N/A 08/18/2016   Procedure: ENTEROSCOPY;  Surgeon: Daneil Dolin, MD;  [facility];  Service: Endoscopy;  Laterality: N/A;  Pediatric colonoscope  . EP IMPLANTABLE DEVICE N/A 03/24/2016   Procedure: Pacemaker Implant;  Surgeon: [name], MD;  Location: [facility];  Service: Cardiovascular;  Laterality: N/A;  . ESOPHAGOGASTRODUODENOSCOPY N/A 08/04/2016   Dr. Oneida Alar: non-critical Schatzki's ring, small hiatal hernia, +H.pylori gastritis on path.   Marland Kitchen GIVENS CAPSULE STUDY N/A 08/16/2016   Procedure: GIVENS CAPSULE STUDY;  Surgeon: Daneil Dolin, MD;  Location: [facility];  Service: Endoscopy;  Laterality: N/A;  . TOTAL ABDOMINAL HYSTERECTOMY W/ BILATERAL SALPINGOOPHORECTOMY  1979    SOCIAL HISTORY:  Social History  Socioeconomic History  . Marital status: Widowed    Spouse name: Not on file  . Number of children: Not on file  . Years of education: Not on file  . Highest education level: Not on file  Occupational History  . Not on file  Tobacco Use  . Smoking status: Current Every Day Smoker    Packs/day: 1.00    Years: 50.00    Pack years: 50.00    Types: Cigarettes    Start date: 02/02/1966  . Smokeless tobacco: Never Used  . Tobacco comment: 1/2 pack daily  Vaping Use  . Vaping Use: Never  used  Substance and Sexual Activity  . Alcohol use: No    Alcohol/week: 0.0 standard drinks    Comment: H/O of 6 pack per day quiting in 2003  . Drug use: No  . Sexual activity: Not Currently  Other Topics Concern  . Not on file  Social History Narrative  . Not on file   Social Determinants of Health   Financial Resource Strain: Not on file  Food Insecurity: Not on file  Transportation Needs: Not on file  Physical Activity: Not on file  Stress: Not on file  Social Connections: Not on file  Intimate Partner Violence: Not on file    FAMILY HISTORY:  Family History  Problem Relation Age of Onset  . Liver disease Mother 66  . Hypertension Mother   . Cerebral aneurysm Father   . Aneurysm Father 54  . Cancer Sister   . Liver disease Brother   . Liver disease Brother   . Liver disease Brother   . Diabetes type II Brother   . Alcohol abuse Brother   . Colon cancer Neg Hx     CURRENT MEDICATIONS:  Current Outpatient Medications  Medication Sig Dispense Refill  . albuterol (PROVENTIL HFA;VENTOLIN HFA) 108 (90 Base) MCG/ACT inhaler Inhale 2 puffs into the lungs every 4 (four) hours as needed for wheezing or shortness of breath. 1 Inhaler 2  . calcium citrate-vitamin D (CITRACAL+D) 315-200 MG-UNIT per tablet Take 1 tablet by mouth daily.    . cephALEXin (KEFLEX) 500 MG capsule Take 1 capsule (500 mg total) by mouth 4 (four) times daily. 20 capsule 0  . clotrimazole (LOTRIMIN) 1 % cream Apply 1 application topically 2 (two) times daily. Beneath stomach 60 g 1  . Cyanocobalamin (VITAMIN B 12 PO) Take 1 tablet by mouth daily.     Marland Kitchen donepezil (ARICEPT) 5 MG tablet Take 1 tablet (5 mg total) by mouth at bedtime. 30 tablet 6  . escitalopram (LEXAPRO) 10 MG tablet Take 1 tablet (10 mg total) by mouth daily. 30 tablet 6  . esomeprazole (NEXIUM) 40 MG capsule TAKE 1 CAPSULE BY MOUTH DAILY BEFORE BREAKFAST. 30 capsule 3  . ezetimibe (ZETIA) 10 MG tablet Take 1 tablet (10 mg total) by  mouth daily. 90 tablet 2  . ferrous sulfate 325 (65 FE) MG tablet Take 325 mg by mouth daily with breakfast.    . furosemide (LASIX) 20 MG tablet Take 1 tablet (20 mg total) by mouth daily. 15 tablet 0  . lidocaine (LIDODERM) 5 % Place 1 patch onto the skin daily. Remove & Discard patch within 12 hours or as directed by MD 30 patch 0  . Multiple Vitamin (MULTIVITAMIN WITH MINERALS) TABS tablet Take 1 tablet by mouth daily.    Marland Kitchen nystatin (MYCOSTATIN/NYSTOP) powder Apply 1 application topically 3 (three) times daily. Beneath breast and stomach 120 g 1  No current facility-administered medications for this visit.    ALLERGIES:  Allergies  Allergen Reactions  . Ace Inhibitors Hives, Shortness Of Breath and Swelling  . Aspirin Other (See Comments)    Blood in stool and nose bleed  . Codeine Swelling  . Pineapple Shortness Of Breath and Swelling  . Plasticized Base [Plastibase] Hives and Swelling    No plastics  . Ultram [Tramadol] Swelling  . Adhesive [Tape] Swelling and Rash  . Naprosyn [Naproxen] Swelling  . Biaxin [Clarithromycin] Nausea And Vomiting and Other (See Comments)    Dizziness.  . Clindamycin/Lincomycin Other (See Comments)    Burning sensation throat, abdomen  . Linzess [Linaclotide]     DIARRHEA, FECAL INCONTINENCE  . Metronidazole     NAUSEA AND WEAKNESS  . Nsaids   . Penicillins     Lip swelling, Hives Has patient had a PCN reaction causing immediate rash, facial/tongue/throat swelling, SOB or lightheadedness with hypotension:YES Has patient had a PCN reaction causing severe rash involving mucus membranes or skin necrosis: NO Has patient had a PCN reaction that required hospitalization NO Has patient had a PCN reaction occurring within the last 10 years: NO If all of the above answers are "NO", then may proceed with Cephalosporin use.  . Varenicline Tartrate Swelling  . Xarelto [Rivaroxaban]     Bleeding    PHYSICAL EXAM:  Performance status (ECOG): 1 -  Symptomatic but completely ambulatory  There were no vitals filed for this visit. Wt Readings from Last 3 Encounters:  09/20/20 147 lb (66.7 kg)  08/29/20 147 lb (66.7 kg)  08/12/20 147 lb (66.7 kg)   Physical Exam Vitals reviewed.  Constitutional:      Appearance: Normal appearance.  Cardiovascular:     Rate and Rhythm: Normal rate and regular rhythm.     Pulses: Normal pulses.     Heart sounds: Normal heart sounds.  Pulmonary:     Effort: Pulmonary effort is normal.     Breath sounds: Normal breath sounds.  Neurological:     General: No focal deficit present.     Mental Status: She is alert and oriented to person, place, and time.  Psychiatric:        Mood and Affect: Mood normal.        Behavior: Behavior normal.     LABORATORY DATA:  I have reviewed the labs as listed.  CBC Latest Ref Rng & Units 10/25/2020 08/29/2020 03/31/2020  WBC 4.0 - 10.5 K/uL 7.3 7.0 5.8  Hemoglobin 12.0 - 15.0 g/dL 10.1(L) 10.8(L) 10.0(L)  Hematocrit 36.0 - 46.0 % 31.2(L) 32.9(L) 30.1(L)  Platelets 150 - 400 K/uL 222 244 202   CMP Latest Ref Rng & Units 10/25/2020 08/29/2020 03/31/2020  Glucose 70 - 99 mg/dL 101(H) 95 95  BUN 8 - 23 mg/dL 27(H) 18 22  Creatinine 0.44 - 1.00 mg/dL 1.21(H) 0.99 1.10(H)  Sodium 135 - 145 mmol/L 139 138 137  Potassium 3.5 - 5.1 mmol/L 4.3 4.0 4.5  Chloride 98 - 111 mmol/L 108 107 106  CO2 22 - 32 mmol/L 25 23 24   Calcium 8.9 - 10.3 mg/dL 9.2 9.1 9.3  Total Protein 6.5 - 8.1 g/dL 7.4 - 7.3  Total Bilirubin 0.3 - 1.2 mg/dL 0.4 - 0.4  Alkaline Phos 38 - 126 U/L 60 - 49  AST 15 - 41 U/L 21 - 22  ALT 0 - 44 U/L 11 - 13      Component Value Date/Time   RBC 3.34 (L) 10/25/2020  1525   MCV 93.4 10/25/2020 1525   MCH 30.2 10/25/2020 1525   MCHC 32.4 10/25/2020 1525   RDW 13.7 10/25/2020 1525   LYMPHSABS 2.2 10/25/2020 1525   MONOABS 0.8 10/25/2020 1525   EOSABS 0.2 10/25/2020 1525   BASOSABS 0.1 10/25/2020 1525    DIAGNOSTIC IMAGING:  I have independently  reviewed the scans and discussed with the patient. CUP PACEART REMOTE DEVICE CHECK  Result Date: 10/12/2020 Scheduled remote reviewed. 1 AMS event 4 seconds. Normal device function.  RP Next remote 91 days.    ASSESSMENT:  1.  Stage I right lung cancer: -SBRT with 54 gray 3 fractions in August 2019. -CT chest with contrast on 08/27/2019 reviewed by me showed radiation changes in the medial right upper lobe with no evidence of recurrence or metastatic disease. -We will plan to repeat another CT scan in 6 months.  2.  Normocytic anemia: -Colonoscopy on 08/05/2016 shows 25 polyps less than 10 mm, redundant left colon, diverticulosis of the hepatic flexure Hemorrhoids. -Last Feraheme infusion on 12/14/2016.  She also has mild degree of CKD contributing to her anemia. -Labs on 08/06/2019 shows hemoglobin 97.9 and G92 and folic acid normal.  Ferritin is 311 with percent saturation of 20.  Folic acid and J19 are normal. -We will see her back in 3 months with repeat labs.   PLAN:  1.  Stage I right lung cancer:  2.  Normocytic anemia:  Orders placed this encounter:  No orders of the defined types were placed in this encounter.    Derek Jack, MD [place] [phone]   I, Thana Ates, am acting as a scribe for Dr. Derek Jack.  {Add Barista Statement}

## 2020-10-27 ENCOUNTER — Ambulatory Visit (HOSPITAL_COMMUNITY): Payer: Medicare Other | Admitting: Hematology

## 2020-10-27 DIAGNOSIS — D649 Anemia, unspecified: Secondary | ICD-10-CM

## 2020-10-28 ENCOUNTER — Ambulatory Visit: Payer: Medicare Other | Admitting: Orthopedic Surgery

## 2020-11-02 NOTE — Progress Notes (Signed)
Remote pacemaker transmission.   

## 2020-11-08 ENCOUNTER — Ambulatory Visit: Payer: Medicare Other | Admitting: Internal Medicine

## 2020-11-12 ENCOUNTER — Emergency Department (HOSPITAL_COMMUNITY): Payer: Medicare Other

## 2020-11-12 ENCOUNTER — Encounter (HOSPITAL_COMMUNITY): Payer: Self-pay | Admitting: *Deleted

## 2020-11-12 ENCOUNTER — Other Ambulatory Visit: Payer: Self-pay

## 2020-11-12 ENCOUNTER — Emergency Department (HOSPITAL_COMMUNITY)
Admission: EM | Admit: 2020-11-12 | Discharge: 2020-11-12 | Disposition: A | Discharge status: Home / Self Care | Payer: Medicare Other | Source: Home / Self Care | Attending: Emergency Medicine | Admitting: Emergency Medicine

## 2020-11-12 DIAGNOSIS — F17210 Nicotine dependence, cigarettes, uncomplicated: Secondary | ICD-10-CM | POA: Insufficient documentation

## 2020-11-12 DIAGNOSIS — I739 Peripheral vascular disease, unspecified: Secondary | ICD-10-CM

## 2020-11-12 DIAGNOSIS — N183 Chronic kidney disease, stage 3 unspecified: Secondary | ICD-10-CM | POA: Insufficient documentation

## 2020-11-12 DIAGNOSIS — Z79899 Other long term (current) drug therapy: Secondary | ICD-10-CM | POA: Insufficient documentation

## 2020-11-12 DIAGNOSIS — E1142 Type 2 diabetes mellitus with diabetic polyneuropathy: Secondary | ICD-10-CM | POA: Insufficient documentation

## 2020-11-12 DIAGNOSIS — E1122 Type 2 diabetes mellitus with diabetic chronic kidney disease: Secondary | ICD-10-CM | POA: Insufficient documentation

## 2020-11-12 DIAGNOSIS — R52 Pain, unspecified: Secondary | ICD-10-CM

## 2020-11-12 DIAGNOSIS — I2510 Atherosclerotic heart disease of native coronary artery without angina pectoris: Secondary | ICD-10-CM | POA: Insufficient documentation

## 2020-11-12 DIAGNOSIS — Z950 Presence of cardiac pacemaker: Secondary | ICD-10-CM | POA: Insufficient documentation

## 2020-11-12 DIAGNOSIS — A419 Sepsis, unspecified organism: Secondary | ICD-10-CM | POA: Diagnosis not present

## 2020-11-12 DIAGNOSIS — I959 Hypotension, unspecified: Secondary | ICD-10-CM | POA: Diagnosis not present

## 2020-11-12 DIAGNOSIS — L03115 Cellulitis of right lower limb: Secondary | ICD-10-CM | POA: Insufficient documentation

## 2020-11-12 DIAGNOSIS — J449 Chronic obstructive pulmonary disease, unspecified: Secondary | ICD-10-CM | POA: Insufficient documentation

## 2020-11-12 DIAGNOSIS — L03119 Cellulitis of unspecified part of limb: Secondary | ICD-10-CM

## 2020-11-12 DIAGNOSIS — E785 Hyperlipidemia, unspecified: Secondary | ICD-10-CM | POA: Insufficient documentation

## 2020-11-12 DIAGNOSIS — Z85118 Personal history of other malignant neoplasm of bronchus and lung: Secondary | ICD-10-CM | POA: Insufficient documentation

## 2020-11-12 DIAGNOSIS — I129 Hypertensive chronic kidney disease with stage 1 through stage 4 chronic kidney disease, or unspecified chronic kidney disease: Secondary | ICD-10-CM | POA: Insufficient documentation

## 2020-11-12 DIAGNOSIS — E1169 Type 2 diabetes mellitus with other specified complication: Secondary | ICD-10-CM | POA: Insufficient documentation

## 2020-11-12 DIAGNOSIS — E1151 Type 2 diabetes mellitus with diabetic peripheral angiopathy without gangrene: Secondary | ICD-10-CM | POA: Insufficient documentation

## 2020-11-12 DIAGNOSIS — L03116 Cellulitis of left lower limb: Secondary | ICD-10-CM | POA: Insufficient documentation

## 2020-11-12 LAB — COMPREHENSIVE METABOLIC PANEL
ALT: 11 U/L (ref 0–44)
AST: 20 U/L (ref 15–41)
Albumin: 3.6 g/dL (ref 3.5–5.0)
Alkaline Phosphatase: 61 U/L (ref 38–126)
Anion gap: 6 (ref 5–15)
BUN: 33 mg/dL — ABNORMAL HIGH (ref 8–23)
CO2: 28 mmol/L (ref 22–32)
Calcium: 9.3 mg/dL (ref 8.9–10.3)
Chloride: 102 mmol/L (ref 98–111)
Creatinine, Ser: 1.53 mg/dL — ABNORMAL HIGH (ref 0.44–1.00)
GFR, Estimated: 34 mL/min — ABNORMAL LOW (ref >60)
Glucose, Bld: 131 mg/dL — ABNORMAL HIGH (ref 70–99)
Potassium: 4.8 mmol/L (ref 3.5–5.1)
Sodium: 136 mmol/L (ref 135–145)
Total Bilirubin: 0.3 mg/dL (ref 0.3–1.2)
Total Protein: 7.2 g/dL (ref 6.5–8.1)

## 2020-11-12 LAB — CBC WITH DIFFERENTIAL/PLATELET
Abs Immature Granulocytes: 0.03 10*3/uL (ref 0.00–0.07)
Basophils Absolute: 0 10*3/uL (ref 0.0–0.1)
Basophils Relative: 1 %
Eosinophils Absolute: 0.1 10*3/uL (ref 0.0–0.5)
Eosinophils Relative: 2 %
HCT: 27.7 % — ABNORMAL LOW (ref 36.0–46.0)
Hemoglobin: 8.9 g/dL — ABNORMAL LOW (ref 12.0–15.0)
Immature Granulocytes: 0 %
Lymphocytes Relative: 25 %
Lymphs Abs: 1.8 10*3/uL (ref 0.7–4.0)
MCH: 29.9 pg (ref 26.0–34.0)
MCHC: 32.1 g/dL (ref 30.0–36.0)
MCV: 93 fL (ref 80.0–100.0)
Monocytes Absolute: 0.9 10*3/uL (ref 0.1–1.0)
Monocytes Relative: 13 %
Neutro Abs: 4.2 10*3/uL (ref 1.7–7.7)
Neutrophils Relative %: 59 %
Platelets: 275 10*3/uL (ref 150–400)
RBC: 2.98 MIL/uL — ABNORMAL LOW (ref 3.87–5.11)
RDW: 13.5 % (ref 11.5–15.5)
WBC: 7.2 10*3/uL (ref 4.0–10.5)
nRBC: 0 % (ref 0.0–0.2)

## 2020-11-12 MED ORDER — DEXTROSE 5 % IV SOLN
1500.0000 mg | Freq: Once | INTRAVENOUS | Status: AC
  Administered 2020-11-12: 1500 mg via INTRAVENOUS
  Filled 2020-11-12: qty 75

## 2020-11-12 NOTE — Progress Notes (Signed)
Pharmacy Antibiotic Note  Charlotte Henry is a 80 y.o. female admitted on 11/12/2020 with cellulitis.  Pharmacy has been consulted for dalbavancin dosing.  Plan: Dalbavancin 1500mg  iv x 1   Height: 5\' 7"  (170.2 cm) Weight: 66.7 kg (147 lb) IBW/kg (Calculated) : 61.6  Temp (24hrs), Avg:98.2 F (36.8 C), Min:98.2 F (36.8 C), Max:98.2 F (36.8 C)  Recent Labs  Lab 11/12/20 1724  WBC 7.2  CREATININE 1.53*    Estimated Creatinine Clearance: 28 mL/min (A) (by C-G formula based on SCr of 1.53 mg/dL (H)).    Allergies  Allergen Reactions   Ace Inhibitors Hives, Shortness Of Breath and Swelling   Aspirin Other (See Comments)    Blood in stool and nose bleed   Codeine Swelling   Pineapple Shortness Of Breath and Swelling   Plasticized Base [Plastibase] Hives and Swelling    No plastics   Ultram [Tramadol] Swelling   Adhesive [Tape] Swelling and Rash   Naprosyn [Naproxen] Swelling   Biaxin [Clarithromycin] Nausea And Vomiting and Other (See Comments)    Dizziness.   Clindamycin/Lincomycin Other (See Comments)    Burning sensation throat, abdomen   Linzess [Linaclotide]     DIARRHEA, FECAL INCONTINENCE   Metronidazole     NAUSEA AND WEAKNESS   Nsaids    Penicillins     Lip swelling, Hives Has patient had a PCN reaction causing immediate rash, facial/tongue/throat swelling, SOB or lightheadedness with hypotension:YES Has patient had a PCN reaction causing severe rash involving mucus membranes or skin necrosis: NO Has patient had a PCN reaction that required hospitalization NO Has patient had a PCN reaction occurring within the last 10 years: NO If all of the above answers are "NO", then may proceed with Cephalosporin use.   Varenicline Tartrate Swelling   Xarelto [Rivaroxaban]     Bleeding    Antimicrobials this admission: 6/10 dalbavancin x 1   Thank you for allowing pharmacy to be a part of this patient's care.  Charlotte Henry 11/12/2020 7:54 PM

## 2020-11-12 NOTE — ED Notes (Signed)
Called AC for Dalvance.

## 2020-11-12 NOTE — ED Triage Notes (Signed)
Bilateral leg swelling with drainage. States legs started draining last night

## 2020-11-12 NOTE — Discharge Instructions (Addendum)
Follow-up with infectious disease, you will be called to schedule an appointment. Given referral for home health.  Follow-up with your primary care provider.  Return to the ER for worsening or concerning symptoms.

## 2020-11-12 NOTE — ED Provider Notes (Signed)
[facility] EMERGENCY DEPARTMENT Provider Note   CSN: [id number] Arrival date & time: 11/12/20  1537     History Chief Complaint  Patient presents with   Leg Swelling    Charlotte Henry is a 80 y.o. female.  80 year old female with history of peripheral vascular disease, diabetes, additional history as listed below, presents with complaint of leg swelling x 1 month, now with drainage from the legs. Patient states she was seen by orthopedics who gave her antibiotics which she completed without any improvement in her legs. Denies CP or SHOB, no other complaints or concerns. Patient lives alone.       Past Medical History:  Diagnosis Date   Arterial fibromuscular dysplasia (HCC)    ASCVD (arteriosclerotic cardiovascular disease)    a. cath in 4/04,-80%LAD; 70% RCA, -> DESx2; residual 60% distal RCA; nl EF  b. Nuc 02/2016 aborted due to bradycardia. Will need to be rescheduled    Carotid artery occlusion    Cerebrovascular disease    CHB (complete heart block) ([place])    a. s/p PPM on 03/24/16 with a St. Jude (serial number [id number]) pacemaker   Chronic kidney disease (CKD), stage III (moderate) (HCC)    Clostridium difficile colitis 03/2013   a. 03/2013   COPD (chronic obstructive pulmonary disease) ([place])    Coronary disease    Diverticulosis    GERD (gastroesophageal reflux disease)    Gout    H pylori ulcer    Hyperlipidemia    chose to stop statin, takes Zetia   Hypertension    not currently on medication   IBS (irritable bowel syndrome)    PVD (peripheral vascular disease) (HCC)    a. moderate to severely decreased ABI's in 8/08, sig. aortic inflow disease b. repeat ABI's in 2011 improved- mild disease on the left and moderate to severe disease on the right   S/P placement of cardiac pacemaker    a. 03/24/16: St. Jude (serial number [id number]) pacemaker   Symptomatic anemia 08/15/2016   TIA (transient ischemic attack) 2012   Tobacco abuse    a. 50 pack years continuing at  1/2 pack per day   Tobacco use disorder 10/15/2015    Patient Active Problem List   Diagnosis Date Noted   Iron deficiency anemia 04/28/2020   Cognitive change 03/25/2019   B12 deficiency 03/24/2019   AAA (abdominal aortic aneurysm) ([place]) 02/17/2019   Vertigo 01/30/2019   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[place]) 01/10/2018   Poor appetite 12/19/2017   Itching 07/19/2017   Gait instability 02/26/2017   Symptomatic anemia 08/15/2016   Protein-calorie malnutrition ([place]) 35/32/9924   Helicobacter pylori gastritis 07/26/2016   Generalized weakness 07/09/2016   Normocytic anemia 07/03/2016   CHB (complete heart block) (HCC)    COPD (chronic obstructive pulmonary disease) ([place])    Tobacco use disorder 10/15/2015   Back pain 12/30/2014   OA (osteoarthritis) 12/30/2014   Carotid stenosis 03/11/2014   PVD (peripheral vascular disease) ([place]) 03/11/2014   Colon cancer screening 02/04/2014   Overactive bladder 12/31/2013   Rash and nonspecific skin eruption 05/19/2013   Numbness and tingling-bilat foot 03/05/2013   IBS (irritable bowel syndrome) 12/30/2012   Peripheral neuropathy 07/01/2012   Insomnia 07/01/2012   Chronic kidney disease (CKD), stage III (moderate) ([place]) 08/25/2010   Cardiovascular disease 08/16/2009   Cerebrovascular disease 08/16/2009   Peripheral vascular disease ([place]) 08/16/2009   Type 2 diabetes mellitus with diabetic neuropathy, unspecified ([place]) 10/26/2008  Hyperlipidemia 10/26/2008   Gout, unspecified 10/26/2008   Essential hypertension 10/26/2008   Asthmatic bronchitis 10/26/2008   Gastroesophageal reflux disease 10/26/2008    Past Surgical History:  Procedure Laterality Date   ABDOMINAL HYSTERECTOMY     carpel tunnel release     bilateral   CHOLECYSTECTOMY     COLONOSCOPY  2009   COLONOSCOPY N/A 08/04/2016   incomplete due to prep.    COLONOSCOPY N/A 08/05/2016   Dr. Oneida Alar: moderately redundant rectosigmoid and sigmoid, normal TI, single  large-mouthed diverticulum in hepatic flexure, external hemorrhoids, path negative for microscopic colitis    ENTEROSCOPY N/A 08/18/2016   Procedure: ENTEROSCOPY;  Surgeon: Daneil Dolin, MD;  Location: [facility] END[facility] SU[facility];  Service: Endoscopy;  Laterality: N/A;  Pediatric colonoscope   EP IMPLANTABLE DEVICE N/A 03/24/2016   Procedure: Pacemaker Implant;  Surgeon: [name], MD;  Location: [place] [facility] LAB;  Service: Cardiovascular;  Laterality: N/A;   ESOPHAGOGASTRODUODENOSCOPY N/A 08/04/2016   Dr. Oneida Alar: non-critical Schatzki's ring, small hiatal hernia, +H.pylori gastritis on path.    GIVENS CAPSULE STUDY N/A 08/16/2016   Procedure: GIVENS CAPSULE STUDY;  Surgeon: Daneil Dolin, MD;  Location: [facility] SU[facility];  Service: Endoscopy;  Laterality: N/A;   TOTAL ABDOMINAL HYSTERECTOMY W/ BILATERAL SALPINGOOPHORECTOMY  1979     OB History     Gravida  2   Para  2   Term  2   Preterm      AB      Living         SAB      IAB      Ectopic      Multiple      Live Births              Family History  Problem Relation Age of Onset   Liver disease Mother 18   Hypertension Mother    Cerebral aneurysm Father    Aneurysm Father 53   Cancer Sister    Liver disease Brother    Liver disease Brother    Liver disease Brother    Diabetes type II Brother    Alcohol abuse Brother    Colon cancer Neg Hx     Social History   Tobacco Use   Smoking status: Every Day    Packs/day: 1.00    Years: 50.00    Pack years: 50.00    Types: Cigarettes    Start date: 02/02/1966   Smokeless tobacco: Never   Tobacco comments:    1/2 pack daily  Vaping Use   Vaping Use: Never used  Substance Use Topics   Alcohol use: No    Alcohol/week: 0.0 standard drinks    Comment: H/O of 6 pack per day quiting in 2003   Drug use: No    Home Medications Prior to Admission medications   Medication Sig Start Date End Date Taking? Authorizing Provider  albuterol (PROVENTIL HFA;VENTOLIN HFA) 108  (90 Base) MCG/ACT inhaler Inhale 2 puffs into the lungs every 4 (four) hours as needed for wheezing or shortness of breath. 07/23/15  Yes [place], Modena Nunnery, MD  calcium citrate-vitamin D (CITRACAL+D) 315-200 MG-UNIT per tablet Take 1 tablet by mouth daily.   Yes [provider]  clotrimazole (LOTRIMIN) 1 % cream Apply 1 application topically 2 (two) times daily. Beneath stomach 05/19/20  Yes [place], Modena Nunnery, MD  Cyanocobalamin (VITAMIN B 12 PO) Take 1 tablet by mouth daily.    Yes [provider]  donepezil (  ARICEPT) 5 MG tablet Take 1 tablet (5 mg total) by mouth at bedtime. 12/16/19  Yes [place], Modena Nunnery, MD  escitalopram (LEXAPRO) 10 MG tablet Take 1 tablet (10 mg total) by mouth daily. 12/16/19  Yes [place], Modena Nunnery, MD  esomeprazole (NEXIUM) 40 MG capsule TAKE 1 CAPSULE BY MOUTH DAILY BEFORE BREAKFAST. 04/27/20  Yes Derek Jack, MD  ezetimibe (ZETIA) 10 MG tablet Take 1 tablet (10 mg total) by mouth daily. 12/27/18  Yes [place], Modena Nunnery, MD  ferrous sulfate 325 (65 FE) MG tablet Take 325 mg by mouth daily with breakfast.   Yes [provider]  lidocaine (LIDODERM) 5 % Place 1 patch onto the skin daily. Remove & Discard patch within 12 hours or as directed by MD 12/18/19  Yes [place], Modena Nunnery, MD  Multiple Vitamin (MULTIVITAMIN WITH MINERALS) TABS tablet Take 1 tablet by mouth daily. 08/05/16  Yes Johnson, Clanford L, MD  nystatin (MYCOSTATIN/NYSTOP) powder Apply 1 application topically 3 (three) times daily. Beneath breast and stomach 05/19/20  Yes [place], Modena Nunnery, MD  cephALEXin (KEFLEX) 500 MG capsule Take 1 capsule (500 mg total) by mouth 4 (four) times daily. Patient not taking: Reported on 11/12/2020 09/20/20   Eulogio Bear, NP  furosemide (LASIX) 20 MG tablet Take 1 tablet (20 mg total) by mouth daily. Patient not taking: Reported on 11/12/2020 08/29/20   Nuala Alpha A, PA-C    Allergies    Ace inhibitors, Aspirin, Codeine, Pineapple,  Plasticized base [plastibase], Ultram [tramadol], Adhesive [tape], Naprosyn [naproxen], Biaxin [clarithromycin], Clindamycin/lincomycin, Linzess [linaclotide], Metronidazole, Nsaids, Penicillins, Varenicline tartrate, and Xarelto [rivaroxaban]  Review of Systems   Review of Systems  Constitutional:  Negative for chills, diaphoresis and fever.  Respiratory:  Negative for shortness of breath.   Cardiovascular:  Positive for leg swelling. Negative for chest pain.  Musculoskeletal:  Positive for myalgias.  Skin:  Positive for wound.  Allergic/Immunologic: Positive for immunocompromised state.  Neurological:  Negative for weakness and numbness.  All other systems reviewed and are negative.  Physical Exam Updated Vital Signs BP (!) 132/48   Pulse 63   Temp 98.2 F (36.8 C) (Oral)   Resp 16   Ht 5\' 7"  (1.702 m)   Wt 66.7 kg   SpO2 100%   BMI 23.02 kg/m   Physical Exam Vitals and nursing note reviewed.  Constitutional:      General: She is not in acute distress.    Appearance: She is well-developed. She is not diaphoretic.  HENT:     Head: Normocephalic and atraumatic.  Cardiovascular:     Pulses: Normal pulses.  Pulmonary:     Effort: Pulmonary effort is normal.     Breath sounds: Normal breath sounds.  Abdominal:     Palpations: Abdomen is soft.     Tenderness: There is no abdominal tenderness.  Musculoskeletal:     Comments: Swelling to bilateral lower extremities, left worse than right, legs are warm to the touch, drainage from left leg more than right leg.  Skin:    General: Skin is warm and dry.     Findings: Erythema present.  Neurological:     Mental Status: She is alert and oriented to person, place, and time.  Psychiatric:        Behavior: Behavior normal.    ED Results / Procedures / Treatments   Labs (all labs ordered are listed, but only abnormal results are displayed) Labs Reviewed  COMPREHENSIVE METABOLIC PANEL - Abnormal; Notable for the following  components:      Result Value   Glucose, Bld 131 (*)    BUN 33 (*)    Creatinine, Ser 1.53 (*)    GFR, Estimated 34 (*)    All other components within normal limits  CBC WITH DIFFERENTIAL/PLATELET - Abnormal; Notable for the following components:   RBC 2.98 (*)    Hemoglobin 8.9 (*)    HCT 27.7 (*)    All other components within normal limits    EKG None  Radiology US Venous Img Lower Bilateral (DVT)  Result Date: 11/12/2020 CLINICAL DATA:  Swelling. EXAM: BILATERAL LOWER EXTREMITY VENOUS DOPPLER ULTRASOUND TECHNIQUE: Gray-scale sonography with graded compression, as well as color Doppler and duplex ultrasound were performed to evaluate the lower extremity deep venous systems from the level of the common femoral vein and including the common femoral, femoral, profunda femoral, popliteal and calf veins including the posterior tibial, peroneal and gastrocnemius veins when visible. The superficial great saphenous vein was also interrogated. Spectral Doppler was utilized to evaluate flow at rest and with distal augmentation maneuvers in the common femoral, femoral and popliteal veins. COMPARISON:  None. FINDINGS: RIGHT LOWER EXTREMITY Common Femoral Vein: No evidence of thrombus. Normal compressibility, respiratory phasicity and response to augmentation. Saphenofemoral Junction: No evidence of thrombus. Normal compressibility and flow on color Doppler imaging. Profunda Femoral Vein: No evidence of thrombus. Normal compressibility and flow on color Doppler imaging. Femoral Vein: No evidence of thrombus. Normal compressibility, respiratory phasicity and response to augmentation. Popliteal Vein: No evidence of thrombus. Normal compressibility, respiratory phasicity and response to augmentation. Calf Veins: No evidence of thrombus. Normal compressibility and flow on color Doppler imaging. Other: Incidental note of peripheral arterial vascular disease and calf edema. LEFT LOWER EXTREMITY Common Femoral  Vein: No evidence of thrombus. Normal compressibility, respiratory phasicity and response to augmentation. Saphenofemoral Junction: No evidence of thrombus. Normal compressibility and flow on color Doppler imaging. Profunda Femoral Vein: No evidence of thrombus. Normal compressibility and flow on color Doppler imaging. Femoral Vein: No evidence of thrombus. Normal compressibility, respiratory phasicity and response to augmentation. Popliteal Vein: No evidence of thrombus. Normal compressibility, respiratory phasicity and response to augmentation. Calf Veins: No evidence of thrombus. Normal compressibility and flow on color Doppler imaging. Superficial Great Saphenous Vein: No evidence of thrombus. Normal compressibility. Venous Reflux:  None. Other: Incidental note of peripheral arterial vascular disease and calf edema. IMPRESSION: 1. No evidence of deep venous thrombosis in either lower extremity. 2. Incidental note of peripheral arterial vascular disease and bilateral calf edema. Electronically Signed   By: Margaretha Sheffield MD   On: 11/12/2020 18:10   DG Chest Port 1 View  Result Date: 11/12/2020 CLINICAL DATA:  Leg swelling.  COPD.  Hypertension.  Smoker. EXAM: PORTABLE CHEST 1 VIEW COMPARISON:  08/29/2020 FINDINGS: Midline trachea. Normal heart size. Tortuous thoracic aorta. Atherosclerosis in the transverse aorta. No pleural effusion or pneumothorax. Dual lead pacer. Diffuse peribronchial thickening. No lobar consolidation. Right upper quadrant surgical clips. IMPRESSION: 1. No acute cardiopulmonary disease. 2. Peribronchial thickening which likely relates to COPD/chronic bronchitis. 3.  Aortic Atherosclerosis (ICD10-I70.0). Electronically Signed   By: Abigail Miyamoto M.D.   On: 11/12/2020 18:35    Procedures Procedures   Medications Ordered in ED Medications  dalbavancin (DALVANCE) 1,500 mg in dextrose 5 % 500 mL IVPB (1,500 mg Intravenous New Bag/Given 11/12/20 2125)    ED Course  I have reviewed  the triage vital signs and the nursing notes.  Pertinent labs & imaging  results that were available during my care of the patient were reviewed by me and considered in my medical decision making (see chart for details).  Clinical Course as of 11/12/20 2156  Fri Nov 13, 7923  [id number] 80 year old female with history of peripheral artery disease presents with bilateral lower extremity swelling, worse on the left with purulent drainage.  Legs are negative for DVT.  CBC and CMP without pain change from prior.  Patient is given Dalvance with follow-up with infectious disease, also request assistance with home health referral.  Patient does not want to be admitted to the hospital, was advised to return for worsening or concerning symptoms. [LM]    Clinical Course User Index [LM] Roque Lias   MDM Rules/Calculators/A&P                           Final Clinical Impression(s) / ED Diagnoses Final diagnoses:  Cellulitis of lower extremity, unspecified laterality  PAD (peripheral artery disease) ([place])    Rx / DC Orders ED Discharge Orders          Ordered    Ambulatory referral to Infectious Disease       Comments: Cellulitis patient:  Received dalbavancin on 11/12/2020.   11/12/20 1954             Tacy Learn, PA-C 11/12/20 2156    Luna Fuse, MD 11/19/20 [phone]

## 2020-11-15 ENCOUNTER — Emergency Department (HOSPITAL_COMMUNITY): Payer: Medicare Other

## 2020-11-15 ENCOUNTER — Encounter (HOSPITAL_COMMUNITY): Payer: Self-pay | Admitting: Emergency Medicine

## 2020-11-15 ENCOUNTER — Inpatient Hospital Stay (HOSPITAL_COMMUNITY)
Admission: EM | Admit: 2020-11-15 | Discharge: 2020-11-18 | DRG: 872 | Disposition: A | Discharge status: Skilled Nursing Facility | Payer: Medicare Other | Attending: Family Medicine | Admitting: Family Medicine

## 2020-11-15 ENCOUNTER — Other Ambulatory Visit: Payer: Self-pay

## 2020-11-15 DIAGNOSIS — Z79899 Other long term (current) drug therapy: Secondary | ICD-10-CM

## 2020-11-15 DIAGNOSIS — E1151 Type 2 diabetes mellitus with diabetic peripheral angiopathy without gangrene: Secondary | ICD-10-CM | POA: Diagnosis present

## 2020-11-15 DIAGNOSIS — E785 Hyperlipidemia, unspecified: Secondary | ICD-10-CM | POA: Diagnosis present

## 2020-11-15 DIAGNOSIS — D631 Anemia in chronic kidney disease: Secondary | ICD-10-CM | POA: Diagnosis present

## 2020-11-15 DIAGNOSIS — B961 Klebsiella pneumoniae [K. pneumoniae] as the cause of diseases classified elsewhere: Secondary | ICD-10-CM | POA: Diagnosis present

## 2020-11-15 DIAGNOSIS — L03116 Cellulitis of left lower limb: Secondary | ICD-10-CM | POA: Diagnosis present

## 2020-11-15 DIAGNOSIS — N179 Acute kidney failure, unspecified: Secondary | ICD-10-CM | POA: Diagnosis present

## 2020-11-15 DIAGNOSIS — N1831 Chronic kidney disease, stage 3a: Secondary | ICD-10-CM | POA: Diagnosis present

## 2020-11-15 DIAGNOSIS — Z950 Presence of cardiac pacemaker: Secondary | ICD-10-CM | POA: Diagnosis not present

## 2020-11-15 DIAGNOSIS — L89152 Pressure ulcer of sacral region, stage 2: Secondary | ICD-10-CM | POA: Diagnosis present

## 2020-11-15 DIAGNOSIS — I130 Hypertensive heart and chronic kidney disease with heart failure and stage 1 through stage 4 chronic kidney disease, or unspecified chronic kidney disease: Secondary | ICD-10-CM | POA: Diagnosis present

## 2020-11-15 DIAGNOSIS — N189 Chronic kidney disease, unspecified: Secondary | ICD-10-CM | POA: Diagnosis present

## 2020-11-15 DIAGNOSIS — L8990 Pressure ulcer of unspecified site, unspecified stage: Secondary | ICD-10-CM | POA: Insufficient documentation

## 2020-11-15 DIAGNOSIS — G629 Polyneuropathy, unspecified: Secondary | ICD-10-CM

## 2020-11-15 DIAGNOSIS — I10 Essential (primary) hypertension: Secondary | ICD-10-CM | POA: Diagnosis not present

## 2020-11-15 DIAGNOSIS — D649 Anemia, unspecified: Secondary | ICD-10-CM

## 2020-11-15 DIAGNOSIS — I959 Hypotension, unspecified: Secondary | ICD-10-CM | POA: Diagnosis present

## 2020-11-15 DIAGNOSIS — Z20822 Contact with and (suspected) exposure to covid-19: Secondary | ICD-10-CM | POA: Diagnosis present

## 2020-11-15 DIAGNOSIS — R54 Age-related physical debility: Secondary | ICD-10-CM | POA: Diagnosis present

## 2020-11-15 DIAGNOSIS — E860 Dehydration: Secondary | ICD-10-CM | POA: Diagnosis present

## 2020-11-15 DIAGNOSIS — Z833 Family history of diabetes mellitus: Secondary | ICD-10-CM

## 2020-11-15 DIAGNOSIS — F17210 Nicotine dependence, cigarettes, uncomplicated: Secondary | ICD-10-CM | POA: Diagnosis present

## 2020-11-15 DIAGNOSIS — R55 Syncope and collapse: Secondary | ICD-10-CM | POA: Diagnosis not present

## 2020-11-15 DIAGNOSIS — A419 Sepsis, unspecified organism: Secondary | ICD-10-CM | POA: Diagnosis present

## 2020-11-15 DIAGNOSIS — E1122 Type 2 diabetes mellitus with diabetic chronic kidney disease: Secondary | ICD-10-CM | POA: Diagnosis present

## 2020-11-15 DIAGNOSIS — I442 Atrioventricular block, complete: Secondary | ICD-10-CM | POA: Diagnosis present

## 2020-11-15 DIAGNOSIS — N3941 Urge incontinence: Secondary | ICD-10-CM | POA: Diagnosis present

## 2020-11-15 DIAGNOSIS — N390 Urinary tract infection, site not specified: Secondary | ICD-10-CM | POA: Diagnosis present

## 2020-11-15 DIAGNOSIS — Z886 Allergy status to analgesic agent status: Secondary | ICD-10-CM

## 2020-11-15 DIAGNOSIS — I9589 Other hypotension: Secondary | ICD-10-CM | POA: Diagnosis not present

## 2020-11-15 DIAGNOSIS — Z8673 Personal history of transient ischemic attack (TIA), and cerebral infarction without residual deficits: Secondary | ICD-10-CM

## 2020-11-15 DIAGNOSIS — Z66 Do not resuscitate: Secondary | ICD-10-CM | POA: Diagnosis present

## 2020-11-15 DIAGNOSIS — I5032 Chronic diastolic (congestive) heart failure: Secondary | ICD-10-CM | POA: Diagnosis present

## 2020-11-15 DIAGNOSIS — J449 Chronic obstructive pulmonary disease, unspecified: Secondary | ICD-10-CM | POA: Diagnosis present

## 2020-11-15 DIAGNOSIS — L03119 Cellulitis of unspecified part of limb: Secondary | ICD-10-CM | POA: Diagnosis not present

## 2020-11-15 DIAGNOSIS — Z888 Allergy status to other drugs, medicaments and biological substances status: Secondary | ICD-10-CM

## 2020-11-15 DIAGNOSIS — Z9181 History of falling: Secondary | ICD-10-CM

## 2020-11-15 DIAGNOSIS — K219 Gastro-esophageal reflux disease without esophagitis: Secondary | ICD-10-CM | POA: Diagnosis present

## 2020-11-15 DIAGNOSIS — Z880 Allergy status to penicillin: Secondary | ICD-10-CM | POA: Diagnosis not present

## 2020-11-15 DIAGNOSIS — R296 Repeated falls: Secondary | ICD-10-CM | POA: Diagnosis present

## 2020-11-15 DIAGNOSIS — E861 Hypovolemia: Secondary | ICD-10-CM | POA: Diagnosis not present

## 2020-11-15 DIAGNOSIS — L03115 Cellulitis of right lower limb: Secondary | ICD-10-CM | POA: Diagnosis present

## 2020-11-15 DIAGNOSIS — Z8249 Family history of ischemic heart disease and other diseases of the circulatory system: Secondary | ICD-10-CM

## 2020-11-15 DIAGNOSIS — I361 Nonrheumatic tricuspid (valve) insufficiency: Secondary | ICD-10-CM | POA: Diagnosis not present

## 2020-11-15 DIAGNOSIS — L0390 Cellulitis, unspecified: Secondary | ICD-10-CM | POA: Diagnosis present

## 2020-11-15 DIAGNOSIS — R300 Dysuria: Secondary | ICD-10-CM

## 2020-11-15 DIAGNOSIS — I2510 Atherosclerotic heart disease of native coronary artery without angina pectoris: Secondary | ICD-10-CM | POA: Diagnosis present

## 2020-11-15 HISTORY — DX: Urinary tract infection, site not specified: N39.0

## 2020-11-15 LAB — CBC WITH DIFFERENTIAL/PLATELET
Abs Immature Granulocytes: 0.02 10*3/uL (ref 0.00–0.07)
Basophils Absolute: 0 10*3/uL (ref 0.0–0.1)
Basophils Relative: 1 %
Eosinophils Absolute: 0.2 10*3/uL (ref 0.0–0.5)
Eosinophils Relative: 3 %
HCT: 25.7 % — ABNORMAL LOW (ref 36.0–46.0)
Hemoglobin: 8.3 g/dL — ABNORMAL LOW (ref 12.0–15.0)
Immature Granulocytes: 0 %
Lymphocytes Relative: 37 %
Lymphs Abs: 2.2 10*3/uL (ref 0.7–4.0)
MCH: 30.1 pg (ref 26.0–34.0)
MCHC: 32.3 g/dL (ref 30.0–36.0)
MCV: 93.1 fL (ref 80.0–100.0)
Monocytes Absolute: 0.8 10*3/uL (ref 0.1–1.0)
Monocytes Relative: 14 %
Neutro Abs: 2.7 10*3/uL (ref 1.7–7.7)
Neutrophils Relative %: 45 %
Platelets: 281 10*3/uL (ref 150–400)
RBC: 2.76 MIL/uL — ABNORMAL LOW (ref 3.87–5.11)
RDW: 13.7 % (ref 11.5–15.5)
WBC: 5.9 10*3/uL (ref 4.0–10.5)
nRBC: 0 % (ref 0.0–0.2)

## 2020-11-15 LAB — COMPREHENSIVE METABOLIC PANEL
ALT: 13 U/L (ref 0–44)
AST: 22 U/L (ref 15–41)
Albumin: 3.5 g/dL (ref 3.5–5.0)
Alkaline Phosphatase: 58 U/L (ref 38–126)
Anion gap: 6 (ref 5–15)
BUN: 36 mg/dL — ABNORMAL HIGH (ref 8–23)
CO2: 25 mmol/L (ref 22–32)
Calcium: 8.9 mg/dL (ref 8.9–10.3)
Chloride: 104 mmol/L (ref 98–111)
Creatinine, Ser: 1.46 mg/dL — ABNORMAL HIGH (ref 0.44–1.00)
GFR, Estimated: 36 mL/min — ABNORMAL LOW (ref >60)
Glucose, Bld: 82 mg/dL (ref 70–99)
Potassium: 4.5 mmol/L (ref 3.5–5.1)
Sodium: 135 mmol/L (ref 135–145)
Total Bilirubin: 0.4 mg/dL (ref 0.3–1.2)
Total Protein: 6.8 g/dL (ref 6.5–8.1)

## 2020-11-15 LAB — URINALYSIS, ROUTINE W REFLEX MICROSCOPIC
Bilirubin Urine: NEGATIVE
Glucose, UA: NEGATIVE mg/dL
Ketones, ur: NEGATIVE mg/dL
Nitrite: POSITIVE — AB
Protein, ur: 30 mg/dL — AB
Specific Gravity, Urine: 1.011 (ref 1.005–1.030)
WBC, UA: >50 WBC/hpf — ABNORMAL HIGH (ref 0–5)
pH: 6 (ref 5.0–8.0)

## 2020-11-15 LAB — PROTIME-INR
INR: 1.1 (ref 0.8–1.2)
Prothrombin Time: 14.3 seconds (ref 11.4–15.2)

## 2020-11-15 LAB — CBG MONITORING, ED: Glucose-Capillary: 106 mg/dL — ABNORMAL HIGH (ref 70–99)

## 2020-11-15 LAB — RESP PANEL BY RT-PCR (FLU A&B, COVID) ARPGX2
Influenza A by PCR: NEGATIVE
Influenza B by PCR: NEGATIVE
SARS Coronavirus 2 by RT PCR: NEGATIVE

## 2020-11-15 LAB — LACTIC ACID, PLASMA: Lactic Acid, Venous: 0.9 mmol/L (ref 0.5–1.9)

## 2020-11-15 LAB — APTT: aPTT: 28 seconds (ref 24–36)

## 2020-11-15 MED ORDER — ONDANSETRON HCL 4 MG/2ML IJ SOLN: 4.0000 mg | Freq: Four times a day (QID) | INTRAMUSCULAR | Status: DC | PRN

## 2020-11-15 MED ORDER — ACETAMINOPHEN 650 MG RE SUPP: 650.0000 mg | Freq: Four times a day (QID) | RECTAL | Status: DC | PRN

## 2020-11-15 MED ORDER — LACTATED RINGERS IV BOLUS (SEPSIS)
1000.0000 mL | Freq: Once | INTRAVENOUS | Status: AC
  Administered 2020-11-15: 1000 mL via INTRAVENOUS

## 2020-11-15 MED ORDER — VANCOMYCIN HCL IN DEXTROSE 1-5 GM/200ML-% IV SOLN: 1000.0000 mg | Freq: Once | INTRAVENOUS | Status: DC

## 2020-11-15 MED ORDER — POLYETHYLENE GLYCOL 3350 17 G PO PACK: 17.0000 g | PACK | Freq: Every day | ORAL | Status: DC | PRN

## 2020-11-15 MED ORDER — DONEPEZIL HCL 5 MG PO TABS
5.0000 mg | ORAL_TABLET | Freq: Every day | ORAL | Status: DC
  Administered 2020-11-15 – 2020-11-17 (×3): 5 mg via ORAL
  Filled 2020-11-15 (×3): qty 1

## 2020-11-15 MED ORDER — VANCOMYCIN HCL 1500 MG/300ML IV SOLN: 1500.0000 mg | Freq: Once | INTRAVENOUS | Status: DC

## 2020-11-15 MED ORDER — LEVOFLOXACIN IN D5W 750 MG/150ML IV SOLN
750.0000 mg | Freq: Once | INTRAVENOUS | Status: AC
  Administered 2020-11-15: 750 mg via INTRAVENOUS
  Filled 2020-11-15: qty 150

## 2020-11-15 MED ORDER — ENOXAPARIN SODIUM 40 MG/0.4ML IJ SOSY
40.0000 mg | PREFILLED_SYRINGE | INTRAMUSCULAR | Status: DC
  Administered 2020-11-15 – 2020-11-17 (×3): 40 mg via SUBCUTANEOUS
  Filled 2020-11-15 (×2): qty 0.4

## 2020-11-15 MED ORDER — ONDANSETRON HCL 4 MG PO TABS: 4.0000 mg | ORAL_TABLET | Freq: Four times a day (QID) | ORAL | Status: DC | PRN

## 2020-11-15 MED ORDER — SODIUM CHLORIDE 0.9 % IV SOLN: 2.0000 g | INTRAVENOUS | Status: DC

## 2020-11-15 MED ORDER — ACETAMINOPHEN 325 MG PO TABS
650.0000 mg | ORAL_TABLET | Freq: Four times a day (QID) | ORAL | Status: DC | PRN
Start: 2020-11-15 — End: 2020-11-18
  Administered 2020-11-16 – 2020-11-18 (×5): 650 mg via ORAL
  Filled 2020-11-15 (×6): qty 2

## 2020-11-15 MED ORDER — SODIUM CHLORIDE 0.9 % IV SOLN
1.0000 g | Freq: Three times a day (TID) | INTRAVENOUS | Status: DC
  Administered 2020-11-16 – 2020-11-17 (×5): 1 g via INTRAVENOUS
  Filled 2020-11-15 (×7): qty 1

## 2020-11-15 MED ORDER — ESCITALOPRAM OXALATE 10 MG PO TABS
10.0000 mg | ORAL_TABLET | Freq: Every day | ORAL | Status: DC
  Administered 2020-11-15 – 2020-11-18 (×4): 10 mg via ORAL
  Filled 2020-11-15 (×4): qty 1

## 2020-11-15 MED ORDER — LACTATED RINGERS IV SOLN: INTRAVENOUS | Status: DC

## 2020-11-15 MED ORDER — SODIUM CHLORIDE 0.9 % IV SOLN: 1.0000 g | INTRAVENOUS | Status: DC

## 2020-11-15 MED ORDER — LACTATED RINGERS IV BOLUS (SEPSIS)
250.0000 mL | Freq: Once | INTRAVENOUS | Status: AC
  Administered 2020-11-15: 250 mL via INTRAVENOUS

## 2020-11-15 NOTE — ED Notes (Signed)
Admitting Provider at bedside. 

## 2020-11-15 NOTE — Progress Notes (Signed)
Pharmacy Antibiotic Note  Charlotte Henry is a 80 y.o. female admitted on 11/15/2020 with hypotension and acute lower UTI.  Pharmacy has been consulted for aztreonam dosing for UTI. Patient received dalbavancin 1500 mg IV x 1 dose on 6/10 and levofloxacin 750 mg IV X 1 in [facility] ED earlier today.  WBC 8.3, Tmax 99.1 F; Scr 1.46, CrCl 29.4 ml/min  Plan: Aztreonam 1 gm IV Q 8 hrs Monitor WBC, temp, clinical improvement, cultures, renal function  Height: 5\' 7"  (170.2 cm) Weight: 67 kg (147 lb 11.3 oz) IBW/kg (Calculated) : 61.6  Temp (24hrs), Avg:98.7 F (37.1 C), Min:98.2 F (36.8 C), Max:99.1 F (37.3 C)  Recent Labs  Lab 11/12/20 1724 11/15/20 1437  WBC 7.2 5.9  CREATININE 1.53* 1.46*  LATICACIDVEN  --  0.9     Estimated Creatinine Clearance: 29.4 mL/min (A) (by C-G formula based on SCr of 1.46 mg/dL (H)).    Allergies  Allergen Reactions   Ace Inhibitors Hives, Shortness Of Breath and Swelling   Aspirin Other (See Comments)    Blood in stool and nose bleed   Codeine Swelling   Pineapple Shortness Of Breath and Swelling   Plasticized Base [Plastibase] Hives and Swelling    No plastics   Ultram [Tramadol] Swelling   Adhesive [Tape] Swelling and Rash   Naprosyn [Naproxen] Swelling   Biaxin [Clarithromycin] Nausea And Vomiting and Other (See Comments)    Dizziness.   Clindamycin/Lincomycin Other (See Comments)    Burning sensation throat, abdomen   Linzess [Linaclotide]     DIARRHEA, FECAL INCONTINENCE   Metronidazole     NAUSEA AND WEAKNESS   Nsaids    Penicillins     Lip swelling, Hives Has patient had a PCN reaction causing immediate rash, facial/tongue/throat swelling, SOB or lightheadedness with hypotension:YES Has patient had a PCN reaction causing severe rash involving mucus membranes or skin necrosis: NO Has patient had a PCN reaction that required hospitalization NO Has patient had a PCN reaction occurring within the last 10 years: NO If all of the  above answers are "NO", then may proceed with Cephalosporin use.   Varenicline Tartrate Swelling   Xarelto [Rivaroxaban]     Bleeding    Antimicrobials this admission: Levaquin X 1 6/13 Dalvance X 1  6/10 Aztreonam 6/13 >>  Microbiology results: 6/13 Bcx X 2: pending 6/13 UCx: pending  6/13 COVID, flu A, flu B: negative  Thank you for allowing pharmacy to be a part of this patient's care.  Gillermina Hu, PharmD, BCPS, [facility] Clinical Pharmacist 11/15/2020 9:19 PM

## 2020-11-15 NOTE — ED Notes (Signed)
Patient given water at this time. Patient refusing hemoccult and admission to the hospital at this time. Mickel Baas, [place] at bedside to discuss refusal; patient verbalized understanding and still refuses.

## 2020-11-15 NOTE — Care Management Note (Addendum)
Patient daughter, Charlotte Henry, contacted Rockne Menghini, CMA, regarding concerns about patient's legs. Patient here on 11/12/20, provided IV abx. Reports that she is in pain, skin coming off, feet are black. Cannot get appointment with new PCP today (Dr. Posey Pronto). Advised to come back to ED for evaluation.    [name], Clydene Pugh, LCSW

## 2020-11-15 NOTE — ED Notes (Signed)
Patient assisted to bedside commode at this time. Patient states weakness with movement.

## 2020-11-15 NOTE — Sepsis Progress Note (Signed)
Sepsis protocol being followed by [facility]

## 2020-11-15 NOTE — ED Notes (Signed)
Daughter is at bedside due to patient refusing to be admitted to the hospital and Mickel Baas, [place] to speak to both daughter and patient about risks of being discharged home.

## 2020-11-15 NOTE — H&P (Addendum)
History and Physical    Charlotte Henry GMW:[id number] DOB: 09/28/1940 DOA: 11/15/2020  PCP: Patient, No Pcp Per (Inactive)  Patient coming from: Home  I have personally briefly reviewed patient's old medical records in [place]  Chief Complaint: Weakness, falls  HPI: Charlotte Henry is a 80 y.o. female with medical history significant for hypertension, peripheral vascular disease, CKD 3, COPD, pacemaker status. Presented to the ED with complaints of increasing lower extremity pain, weakness, and falls over the past week. The ED 6/10, was diagnosed with bilateral lower extremity cellulitis left greater than right with left leg at least twice as big as right leg, with drainage.  Bilateral venous Dopplers were negative for DVT.  She was given a dose of dalbavancin, and discharged home.  Patient was brought back to the ED with reports of increasing pain in her legs.  But on further questioning, daughter agrees lower extremities appear better than 3 days ago.  Patient reports pain with urination, with urinary incontinence, urgency.  She reports good oral intake, no vomiting no loose stools.  She denies dizziness. Patient has a history of falls, but over the past week falls have been more frequent.  Patient reports her last fall was Friday prior to ED visit, she believes she lost consciousness.  But denies chest pain or difficulty breathing.  ED Course: Temp 99.1.  Heart rate 60s.  Respiratory rate 16-19.  Blood pressure systolic 44/03 improved to 135/65 after IV fluids.  O2 sats greater than 96% on room air.  WBC 5.9.  UA suggestive of UTI.  Chest x-ray unremarkable.  Lower extremity Xrays obtained, showed right- nonspecific soft tissue swelling. Left unremarkable.  With hypotension and concern for sepsis, broad-spectrum antibiotics was initiated with IV cefepime and Levaquin.  ED provider talked to pharmacy-no need for vancomycin as patient had received dalbavancin.  Review of Systems: As  per HPI all other systems reviewed and negative.  Past Medical History:  Diagnosis Date   Arterial fibromuscular dysplasia (HCC)    ASCVD (arteriosclerotic cardiovascular disease)    a. cath in 4/04,-80%LAD; 70% RCA, -> DESx2; residual 60% distal RCA; nl EF  b. Nuc 02/2016 aborted due to bradycardia. Will need to be rescheduled    Carotid artery occlusion    Cerebrovascular disease    CHB (complete heart block) ([place])    a. s/p PPM on 03/24/16 with a [employer] Jude (serial number [id number]) pacemaker   Chronic kidney disease (CKD), stage III (moderate) (HCC)    Clostridium difficile colitis 03/2013   a. 03/2013   COPD (chronic obstructive pulmonary disease) ([place])    Coronary disease    Diverticulosis    GERD (gastroesophageal reflux disease)    Gout    H pylori ulcer    Hyperlipidemia    chose to stop statin, takes Zetia   Hypertension    not currently on medication   IBS (irritable bowel syndrome)    PVD (peripheral vascular disease) (HCC)    a. moderate to severely decreased ABI's in 8/08, sig. aortic inflow disease b. repeat ABI's in 2011 improved- mild disease on the left and moderate to severe disease on the right   S/P placement of cardiac pacemaker    a. 03/24/16: St. Jude (serial number [id number]) pacemaker   Symptomatic anemia 08/15/2016   TIA (transient ischemic attack) 2012   Tobacco abuse    a. 50 pack years continuing at 1/2 pack per day   Tobacco use disorder 10/15/2015    Past  Surgical History:  Procedure Laterality Date   ABDOMINAL HYSTERECTOMY     carpel tunnel release     bilateral   CHOLECYSTECTOMY     COLONOSCOPY  2009   COLONOSCOPY N/A 08/04/2016   incomplete due to prep.    COLONOSCOPY N/A 08/05/2016   Dr. Oneida Alar: moderately redundant rectosigmoid and sigmoid, normal TI, single large-mouthed diverticulum in hepatic flexure, external hemorrhoids, path negative for microscopic colitis    ENTEROSCOPY N/A 08/18/2016   Procedure: ENTEROSCOPY;  Surgeon: Daneil Dolin,  MD;  [facility];  Service: Endoscopy;  Laterality: N/A;  Pediatric colonoscope   EP IMPLANTABLE DEVICE N/A 03/24/2016   Procedure: Pacemaker Implant;  Surgeon: [name], MD;  Location: [facility];  Service: Cardiovascular;  Laterality: N/A;   ESOPHAGOGASTRODUODENOSCOPY N/A 08/04/2016   Dr. Oneida Alar: non-critical Schatzki's ring, small hiatal hernia, +H.pylori gastritis on path.    GIVENS CAPSULE STUDY N/A 08/16/2016   Procedure: GIVENS CAPSULE STUDY;  Surgeon: Daneil Dolin, MD;  Location: [facility];  Service: Endoscopy;  Laterality: N/A;   TOTAL ABDOMINAL HYSTERECTOMY W/ BILATERAL SALPINGOOPHORECTOMY  1979     reports that she has been smoking cigarettes. She started smoking about 54 years ago. She has a 50.00 pack-year smoking history. She has never used smokeless tobacco. She reports that she does not drink alcohol and does not use drugs.  Allergies  Allergen Reactions   Ace Inhibitors Hives, Shortness Of Breath and Swelling   Aspirin Other (See Comments)    Blood in stool and nose bleed   Codeine Swelling   Pineapple Shortness Of Breath and Swelling   Plasticized Base [Plastibase] Hives and Swelling    No plastics   Ultram [Tramadol] Swelling   Adhesive [Tape] Swelling and Rash   Naprosyn [Naproxen] Swelling   Biaxin [Clarithromycin] Nausea And Vomiting and Other (See Comments)    Dizziness.   Clindamycin/Lincomycin Other (See Comments)    Burning sensation throat, abdomen   Linzess [Linaclotide]     DIARRHEA, FECAL INCONTINENCE   Metronidazole     NAUSEA AND WEAKNESS   Nsaids    Penicillins     Lip swelling, Hives Has patient had a PCN reaction causing immediate rash, facial/tongue/throat swelling, SOB or lightheadedness with hypotension:YES Has patient had a PCN reaction causing severe rash involving mucus membranes or skin necrosis: NO Has patient had a PCN reaction that required hospitalization NO Has patient had a PCN reaction occurring within  the last 10 years: NO If all of the above answers are "NO", then may proceed with Cephalosporin use.   Varenicline Tartrate Swelling   Xarelto [Rivaroxaban]     Bleeding    Family History  Problem Relation Age of Onset   Liver disease Mother 18   Hypertension Mother    Cerebral aneurysm Father    Aneurysm Father 4   Cancer Sister    Liver disease Brother    Liver disease Brother    Liver disease Brother    Diabetes type II Brother    Alcohol abuse Brother    Colon cancer Neg Hx    Prior to Admission medications   Medication Sig Start Date End Date Taking? Authorizing Provider  albuterol (PROVENTIL HFA;VENTOLIN HFA) 108 (90 Base) MCG/ACT inhaler Inhale 2 puffs into the lungs every 4 (four) hours as needed for wheezing or shortness of breath. 07/23/15  Yes [place], Modena Nunnery, MD  calcium citrate-vitamin D (CITRACAL+D) 315-200 MG-UNIT per tablet Take 1 tablet by mouth daily.  Yes [provider]  clotrimazole (LOTRIMIN) 1 % cream Apply 1 application topically 2 (two) times daily. Beneath stomach 05/19/20  Yes [place], Modena Nunnery, MD  Cyanocobalamin (VITAMIN B 12 PO) Take 1 tablet by mouth daily.    Yes [provider]  donepezil (ARICEPT) 5 MG tablet Take 1 tablet (5 mg total) by mouth at bedtime. 12/16/19  Yes [place], Modena Nunnery, MD  escitalopram (LEXAPRO) 10 MG tablet Take 1 tablet (10 mg total) by mouth daily. 12/16/19  Yes [place], Modena Nunnery, MD  ezetimibe (ZETIA) 10 MG tablet Take 1 tablet (10 mg total) by mouth daily. 12/27/18  Yes [place], Modena Nunnery, MD  ferrous sulfate 325 (65 FE) MG tablet Take 325 mg by mouth daily with breakfast.   Yes [provider]  lidocaine (LIDODERM) 5 % Place 1 patch onto the skin daily. Remove & Discard patch within 12 hours or as directed by MD 12/18/19  Yes [place], Modena Nunnery, MD  Multiple Vitamin (MULTIVITAMIN WITH MINERALS) TABS tablet Take 1 tablet by mouth daily. 08/05/16  Yes Johnson, Clanford L, MD  nystatin (MYCOSTATIN/NYSTOP)  powder Apply 1 application topically 3 (three) times daily. Beneath breast and stomach 05/19/20  Yes [place], Modena Nunnery, MD  cephALEXin (KEFLEX) 500 MG capsule Take 1 capsule (500 mg total) by mouth 4 (four) times daily. Patient not taking: No sig reported 09/20/20   Eulogio Bear, NP  esomeprazole (NEXIUM) 40 MG capsule TAKE 1 CAPSULE BY MOUTH DAILY BEFORE BREAKFAST. Patient not taking: Reported on 11/15/2020 04/27/20   Derek Jack, MD  furosemide (LASIX) 20 MG tablet Take 1 tablet (20 mg total) by mouth daily. Patient not taking: No sig reported 08/29/20   Deliah Boston, [place]    Physical Exam: Vitals:   11/15/20 1322 11/15/20 1345 11/15/20 1430 11/15/20 1530  BP: (!) 86/51  (!) 94/47 126/66  Pulse: 61   61  Resp: 16   19  Temp: 99.1 F (37.3 C)     TempSrc: Oral     SpO2: 99%   (!) 89%  Weight:  67 kg    Height:  5\' 7"  (1.702 m)      Constitutional: NAD, calm, comfortable Vitals:   11/15/20 1322 11/15/20 1345 11/15/20 1430 11/15/20 1530  BP: (!) 86/51  (!) 94/47 126/66  Pulse: 61   61  Resp: 16   19  Temp: 99.1 F (37.3 C)     TempSrc: Oral     SpO2: 99%   (!) 89%  Weight:  67 kg    Height:  5\' 7"  (1.702 m)     Eyes: PERRL, lids and conjunctivae normal ENMT: Mucous membranes are moist.  Neck: normal, supple, no masses, no thyromegaly Respiratory: clear to auscultation bilaterally, no wheezing, no crackles. Normal respiratory effort. No accessory muscle use.  Cardiovascular: Pace maker pocket left upper chest, no abnormalities appreciated, Regular rate and rhythm, 2/6 systolic murmur / no rubs / gallops.  1+ pitting extremity edema bilaterally. 2+ pedal pulses. No carotid bruits.  Abdomen: no tenderness, no masses palpated. No hepatosplenomegaly. Bowel sounds positive.  Musculoskeletal: no clubbing / cyanosis. No joint deformity upper and lower extremities. Good ROM, no contractures. Normal muscle tone.  Skin: Whitish discoloration on bilateral feet from  nystatin cream/powders, minimal drainage from lower extremities from small bilateral and superficial open blisters, worse on the left.  Bilateral feet warm, tender to palpation.  No induration Neurologic: No apparent cranial nerve abnormality, 4+5 strength in all extremities. Psychiatric: Normal  judgment and insight. Alert and oriented x 3. Normal mood.       Labs on Admission: I have personally reviewed following labs and imaging studies  CBC: Recent Labs  Lab 11/12/20 1724 11/15/20 1437  WBC 7.2 5.9  NEUTROABS 4.2 2.7  HGB 8.9* 8.3*  HCT 27.7* 25.7*  MCV 93.0 93.1  PLT 275 712   Basic Metabolic Panel: Recent Labs  Lab 11/12/20 1724 11/15/20 1437  NA 136 135  K 4.8 4.5  CL 102 104  CO2 28 25  GLUCOSE 131* 82  BUN 33* 36*  CREATININE 1.53* 1.46*  CALCIUM 9.3 8.9   Liver Function Tests: Recent Labs  Lab 11/12/20 1724 11/15/20 1437  AST 20 22  ALT 11 13  ALKPHOS 61 58  BILITOT 0.3 0.4  PROT 7.2 6.8  ALBUMIN 3.6 3.5   Coagulation Profile: Recent Labs  Lab 11/15/20 1437  INR 1.1   Urine analysis:    Component Value Date/Time   COLORURINE YELLOW 11/15/2020 1414   APPEARANCEUR CLOUDY (A) 11/15/2020 1414   LABSPEC 1.011 11/15/2020 1414   PHURINE 6.0 11/15/2020 1414   GLUCOSEU NEGATIVE 11/15/2020 1414   HGBUR SMALL (A) 11/15/2020 1414   BILIRUBINUR NEGATIVE 11/15/2020 1414   KETONESUR NEGATIVE 11/15/2020 1414   PROTEINUR 30 (A) 11/15/2020 1414   UROBILINOGEN 2 (H) 12/21/2014 1455   NITRITE POSITIVE (A) 11/15/2020 1414   LEUKOCYTESUR MODERATE (A) 11/15/2020 1414    Radiological Exams on Admission: DG Tibia/Fibula Left  Result Date: 11/15/2020 CLINICAL DATA:  80 year old female with fall and left leg pain. EXAM: LEFT TIBIA AND FIBULA - 2 VIEW COMPARISON:  Left knee radiograph dated 03/21/2012. FINDINGS: There is no acute fracture or dislocation. The bones are osteopenic. There is degenerative changes of the left knee. There is diffuse subcutaneous  edema. IMPRESSION: 1. No acute fracture or dislocation. 2. Osteopenia. Electronically Signed   By: Anner Crete M.D.   On: 11/15/2020 15:14   DG Tibia/Fibula Right  Result Date: 11/15/2020 CLINICAL DATA:  Fall.  Leg pain.  Cellulitis and swelling. EXAM: RIGHT TIBIA AND FIBULA - 2 VIEW COMPARISON:  None. FINDINGS: Pronounced artifact on the lateral views. Nonspecific regional soft tissue swelling. Arterial calcification is noted. Benign medullary calcification in the distal tibia. Ordinary osteoarthritis of the knee joint. IMPRESSION: Nonspecific soft tissue swelling. Electronically Signed   By: Nelson Chimes M.D.   On: 11/15/2020 15:12   DG Chest Port 1 View  Result Date: 11/15/2020 CLINICAL DATA:  Lower extremity swelling.  Sepsis. EXAM: PORTABLE CHEST 1 VIEW COMPARISON:  Single-view of the chest 11/12/2020. PA and lateral chest 08/29/2020. CT chest 10/25/2020. FINDINGS: The lungs are emphysematous but clear. Heart size is normal. Aortic atherosclerosis. No pneumothorax or pleural fluid. No acute or focal bony abnormality. IMPRESSION: No acute disease. Aortic Atherosclerosis (ICD10-I70.0) and Emphysema (ICD10-J43.9). Electronically Signed   By: Inge Rise M.D.   On: 11/15/2020 15:10    EKG: Independently reviewed..  Paced rhythm, rate 59.  QTc 430.  No significant change from prior-compared to EKG from 2013.  Assessment/Plan Principal Problem:   Hypotension Active Problems:   CHB (complete heart block) (HCC)   Acute lower UTI   Cellulitis   Multiple falls   Status cardiac pacemaker   Essential hypertension   Chronic kidney disease (CKD), stage III (moderate) (HCC)   Peripheral neuropathy   Urinary tract infection-initial hypotension 86/51, improved with 2.25 L sepsis fluid bolus now 135/65, normal lactic acid 0.9.  Otherwise patient does not  meet sepsis criteria.  Symptoms of dysuria, urinary incontinence.  T-max 99.1.  WBC 5.9.  No prior urine cultures on file. -Start aztreonam.  ( Per pharm- Patient has significant penicillin allergy, no documented cephalosporin exposure, QTC prolonged ) . -Follow-up blood and urine cultures  Bilateral LE cellulitis-with drainage, appears to be improving after dose of dalbavancin given during ED visit 6/10.  With bilateral lower extremity venous Dopplers negative for DVT 6/10. -Continue IV ceftriaxone -Hydrocodone acetaminophen as needed  Hypotension-initially hypotensive.  Responded to fluids.  Denies GI losses or poor oral intake.  Not on antihypertensives. -2.25 L bolus given, continue N/s 100cc/hr x 15hrs  Multiple falls, possible syncope- in the setting of acute illness, hypotension. Falls at baseline, increased frequency of falls over the past week with acute illness.  No neurologic deficit. - PT eval - Echo  CKD 3-creatinine 1.46 at baseline.  Memory problems -daughter reports baseline intermittent/occasional confused speech unchanged.  Cardiac pacemaker status, hx of complete heart block  DVT prophylaxis: Lovenox Code Status: DNR-confirmed with patient and daughter Anderson Malta at bedside. Family Communication: Daughter Engineer, civil (consulting) at bedside, but Georgeanna Harrison is HCPOA.  Disposition Plan: ~ 2 days Consults called: None Admission status: Inpt, tele  I certify that at the point of admission it is my clinical judgment that the patient will require inpatient hospital care spanning beyond 2 midnights from the point of admission due to high intensity of service, high risk for further deterioration and high frequency of surveillance required.    Bethena Roys MD [facility]  11/15/2020, 9:11 PM

## 2020-11-15 NOTE — ED Provider Notes (Signed)
[facility] EMERGENCY DEPARTMENT Provider Note   CSN: [id number] Arrival date & time: 11/15/20  1311     History Chief Complaint  Patient presents with   Leg Swelling    Charlotte Henry is a 80 y.o. female.  80 year old female returns to the emergency room, seen by me 3 days ago for lower extremity cellulitis, treated with Dalvance at that time.  Patient's daughter Anderson Malta is at bedside, states patient continues to complain of pain in her legs, generally weak and unable to ambulate safely at home, has fallen 4 times in the past week without injury from these falls.  Diagnosis of diabetes on file however daughter states she has not a diabetic.      Past Medical History:  Diagnosis Date   Arterial fibromuscular dysplasia (HCC)    ASCVD (arteriosclerotic cardiovascular disease)    a. cath in 4/04,-80%LAD; 70% RCA, -> DESx2; residual 60% distal RCA; nl EF  b. Nuc 02/2016 aborted due to bradycardia. Will need to be rescheduled    Carotid artery occlusion    Cerebrovascular disease    CHB (complete heart block) ([place])    a. s/p PPM on 03/24/16 with a St. Jude (serial number [id number]) pacemaker   Chronic kidney disease (CKD), stage III (moderate) (HCC)    Clostridium difficile colitis 03/2013   a. 03/2013   COPD (chronic obstructive pulmonary disease) ([place])    Coronary disease    Diverticulosis    GERD (gastroesophageal reflux disease)    Gout    H pylori ulcer    Hyperlipidemia    chose to stop statin, takes Zetia   Hypertension    not currently on medication   IBS (irritable bowel syndrome)    PVD (peripheral vascular disease) (HCC)    a. moderate to severely decreased ABI's in 8/08, sig. aortic inflow disease b. repeat ABI's in 2011 improved- mild disease on the left and moderate to severe disease on the right   S/P placement of cardiac pacemaker    a. 03/24/16: St. Jude (serial number [id number]) pacemaker   Symptomatic anemia 08/15/2016   TIA (transient ischemic attack) 2012    Tobacco abuse    a. 50 pack years continuing at 1/2 pack per day   Tobacco use disorder 10/15/2015    Patient Active Problem List   Diagnosis Date Noted   Iron deficiency anemia 04/28/2020   Cognitive change 03/25/2019   B12 deficiency 03/24/2019   AAA (abdominal aortic aneurysm) ([place]) 02/17/2019   Vertigo 01/30/2019   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[place]) 01/10/2018   Poor appetite 12/19/2017   Itching 07/19/2017   Gait instability 02/26/2017   Symptomatic anemia 08/15/2016   Protein-calorie malnutrition ([place]) 38/18/2993   Helicobacter pylori gastritis 07/26/2016   Generalized weakness 07/09/2016   Normocytic anemia 07/03/2016   CHB (complete heart block) (HCC)    COPD (chronic obstructive pulmonary disease) ([place])    Tobacco use disorder 10/15/2015   Back pain 12/30/2014   OA (osteoarthritis) 12/30/2014   Carotid stenosis 03/11/2014   PVD (peripheral vascular disease) ([place]) 03/11/2014   Colon cancer screening 02/04/2014   Overactive bladder 12/31/2013   Rash and nonspecific skin eruption 05/19/2013   Numbness and tingling-bilat foot 03/05/2013   IBS (irritable bowel syndrome) 12/30/2012   Peripheral neuropathy 07/01/2012   Insomnia 07/01/2012   Chronic kidney disease (CKD), stage III (moderate) ([place]) 08/25/2010   Cardiovascular disease 08/16/2009   Cerebrovascular disease 08/16/2009   Peripheral vascular disease ([place]) 08/16/2009  Type 2 diabetes mellitus with diabetic neuropathy, unspecified ([place]) 10/26/2008   Hyperlipidemia 10/26/2008   Gout, unspecified 10/26/2008   Essential hypertension 10/26/2008   Asthmatic bronchitis 10/26/2008   Gastroesophageal reflux disease 10/26/2008    Past Surgical History:  Procedure Laterality Date   ABDOMINAL HYSTERECTOMY     carpel tunnel release     bilateral   CHOLECYSTECTOMY     COLONOSCOPY  2009   COLONOSCOPY N/A 08/04/2016   incomplete due to prep.    COLONOSCOPY N/A 08/05/2016   Dr. Oneida Alar: moderately  redundant rectosigmoid and sigmoid, normal TI, single large-mouthed diverticulum in hepatic flexure, external hemorrhoids, path negative for microscopic colitis    ENTEROSCOPY N/A 08/18/2016   Procedure: ENTEROSCOPY;  Surgeon: Daneil Dolin, MD;  Location: [facility];  Service: Endoscopy;  Laterality: N/A;  Pediatric colonoscope   EP IMPLANTABLE DEVICE N/A 03/24/2016   Procedure: Pacemaker Implant;  Surgeon: [name], MD;  Location: [place] CV LAB;  Service: Cardiovascular;  Laterality: N/A;   ESOPHAGOGASTRODUODENOSCOPY N/A 08/04/2016   Dr. Oneida Alar: non-critical Schatzki's ring, small hiatal hernia, +H.pylori gastritis on path.    GIVENS CAPSULE STUDY N/A 08/16/2016   Procedure: GIVENS CAPSULE STUDY;  Surgeon: Daneil Dolin, MD;  Location: [facility] END[facility];  Service: Endoscopy;  Laterality: N/A;   TOTAL ABDOMINAL HYSTERECTOMY W/ BILATERAL SALPINGOOPHORECTOMY  1979     OB History     Gravida  2   Para  2   Term  2   Preterm      AB      Living         SAB      IAB      Ectopic      Multiple      Live Births              Family History  Problem Relation Age of Onset   Liver disease Mother 15   Hypertension Mother    Cerebral aneurysm Father    Aneurysm Father 70   Cancer Sister    Liver disease Brother    Liver disease Brother    Liver disease Brother    Diabetes type II Brother    Alcohol abuse Brother    Colon cancer Neg Hx     Social History   Tobacco Use   Smoking status: Every Day    Packs/day: 1.00    Years: 50.00    Pack years: 50.00    Types: Cigarettes    Start date: 02/02/1966   Smokeless tobacco: Never   Tobacco comments:    1/2 pack daily  Vaping Use   Vaping Use: Never used  Substance Use Topics   Alcohol use: No    Alcohol/week: 0.0 standard drinks    Comment: H/O of 6 pack per day quiting in 2003   Drug use: No    Home Medications Prior to Admission medications   Medication Sig Start Date End Date Taking? Authorizing  Provider  albuterol (PROVENTIL HFA;VENTOLIN HFA) 108 (90 Base) MCG/ACT inhaler Inhale 2 puffs into the lungs every 4 (four) hours as needed for wheezing or shortness of breath. 07/23/15  Yes [place], Modena Nunnery, MD  calcium citrate-vitamin D (CITRACAL+D) 315-200 MG-UNIT per tablet Take 1 tablet by mouth daily.   Yes [provider]  clotrimazole (LOTRIMIN) 1 % cream Apply 1 application topically 2 (two) times daily. Beneath stomach 05/19/20  Yes [place], Modena Nunnery, MD  Cyanocobalamin (VITAMIN B 12 PO) Take 1 tablet  by mouth daily.    Yes [provider]  donepezil (ARICEPT) 5 MG tablet Take 1 tablet (5 mg total) by mouth at bedtime. 12/16/19  Yes [place], Modena Nunnery, MD  escitalopram (LEXAPRO) 10 MG tablet Take 1 tablet (10 mg total) by mouth daily. 12/16/19  Yes [place], Modena Nunnery, MD  ezetimibe (ZETIA) 10 MG tablet Take 1 tablet (10 mg total) by mouth daily. 12/27/18  Yes [place], Modena Nunnery, MD  ferrous sulfate 325 (65 FE) MG tablet Take 325 mg by mouth daily with breakfast.   Yes [provider]  lidocaine (LIDODERM) 5 % Place 1 patch onto the skin daily. Remove & Discard patch within 12 hours or as directed by MD 12/18/19  Yes [place], Modena Nunnery, MD  Multiple Vitamin (MULTIVITAMIN WITH MINERALS) TABS tablet Take 1 tablet by mouth daily. 08/05/16  Yes Johnson, Clanford L, MD  nystatin (MYCOSTATIN/NYSTOP) powder Apply 1 application topically 3 (three) times daily. Beneath breast and stomach 05/19/20  Yes [place], Modena Nunnery, MD  cephALEXin (KEFLEX) 500 MG capsule Take 1 capsule (500 mg total) by mouth 4 (four) times daily. Patient not taking: No sig reported 09/20/20   Eulogio Bear, NP  esomeprazole (NEXIUM) 40 MG capsule TAKE 1 CAPSULE BY MOUTH DAILY BEFORE BREAKFAST. Patient not taking: Reported on 11/15/2020 04/27/20   Derek Jack, MD  furosemide (LASIX) 20 MG tablet Take 1 tablet (20 mg total) by mouth daily. Patient not taking: No sig reported 08/29/20   Nuala Alpha A, PA-C    Allergies    Ace inhibitors, Aspirin, Codeine, Pineapple, Plasticized base [plastibase], Ultram [tramadol], Adhesive [tape], Naprosyn [naproxen], Biaxin [clarithromycin], Clindamycin/lincomycin, Linzess [linaclotide], Metronidazole, Nsaids, Penicillins, Varenicline tartrate, and Xarelto [rivaroxaban]  Review of Systems   Review of Systems  Constitutional:  Negative for chills, diaphoresis and fever.  Respiratory:  Negative for shortness of breath.   Cardiovascular:  Negative for chest pain.  Gastrointestinal:  Negative for abdominal pain, constipation, diarrhea, nausea and vomiting.  Genitourinary:  Negative for difficulty urinating and dysuria.  Musculoskeletal:  Positive for gait problem and myalgias. Negative for arthralgias and joint swelling.  Skin:  Positive for wound.  Allergic/Immunologic: Positive for immunocompromised state.  Neurological:  Positive for weakness.  Hematological:  Does not bruise/bleed easily.  Psychiatric/Behavioral:  Negative for confusion.   All other systems reviewed and are negative.  Physical Exam Updated Vital Signs BP 126/66   Pulse 61   Temp 99.1 F (37.3 C) (Oral)   Resp 19   Ht 5\' 7"  (1.702 m)   Wt 67 kg   SpO2 (!) 89%   BMI 23.13 kg/m   Physical Exam Vitals and nursing note reviewed.  Constitutional:      General: She is not in acute distress.    Appearance: She is well-developed. She is not diaphoretic.  HENT:     Head: Normocephalic and atraumatic.     Mouth/Throat:     Mouth: Mucous membranes are moist.  Cardiovascular:     Rate and Rhythm: Normal rate and regular rhythm.     Pulses: Normal pulses.     Heart sounds: Normal heart sounds.     Comments: DP pulses found on bedside Doppler and marked. Pulmonary:     Effort: Pulmonary effort is normal.     Breath sounds: Normal breath sounds.  Abdominal:     Palpations: Abdomen is soft.     Tenderness: There is no abdominal tenderness.  Musculoskeletal:  General: Tenderness present.     Cervical back: Neck supple.     Right lower leg: Edema present.     Left lower leg: Edema present.     Comments: Mild tenderness bilateral lower extremities, extremities are warm to the touch, sensation intact.  Slight drainage from left lower leg however significantly improved from prior visit.  Skin:    General: Skin is warm.  Neurological:     Mental Status: She is alert and oriented to person, place, and time.  Psychiatric:        Behavior: Behavior normal.    ED Results / Procedures / Treatments   Labs (all labs ordered are listed, but only abnormal results are displayed) Labs Reviewed  CBC WITH DIFFERENTIAL/PLATELET - Abnormal; Notable for the following components:      Result Value   RBC 2.76 (*)    Hemoglobin 8.3 (*)    HCT 25.7 (*)    All other components within normal limits  COMPREHENSIVE METABOLIC PANEL - Abnormal; Notable for the following components:   BUN 36 (*)    Creatinine, Ser 1.46 (*)    GFR, Estimated 36 (*)    All other components within normal limits  URINALYSIS, ROUTINE W REFLEX MICROSCOPIC - Abnormal; Notable for the following components:   APPearance CLOUDY (*)    Hgb urine dipstick SMALL (*)    Protein, ur 30 (*)    Nitrite POSITIVE (*)    Leukocytes,Ua MODERATE (*)    WBC, UA >50 (*)    Bacteria, UA MANY (*)    All other components within normal limits  CULTURE, BLOOD (ROUTINE X 2)  CULTURE, BLOOD (ROUTINE X 2)  RESP PANEL BY RT-PCR (FLU A&B, COVID) [id number]  URINE CULTURE  LACTIC ACID, PLASMA  PROTIME-INR  APTT  POC OCCULT BLOOD, ED    EKG EKG Interpretation  Date/Time:  Monday November 15 2020 14:39:38 EDT Ventricular Rate:  69 PR Interval:  55 QRS Duration: 164 QT Interval:  501 QTC Calculation: 501 R Axis:   -78 Text Interpretation: Paced rhythm Atrial premature complex Short PR interval Nonspecific IVCD with LAD Left ventricular hypertrophy Confirmed by Sherwood Gambler [phone]) on 11/15/2020 2:42:05  PM  Radiology DG Tibia/Fibula Left  Result Date: 11/15/2020 CLINICAL DATA:  80 year old female with fall and left leg pain. EXAM: LEFT TIBIA AND FIBULA - 2 VIEW COMPARISON:  Left knee radiograph dated 03/21/2012. FINDINGS: There is no acute fracture or dislocation. The bones are osteopenic. There is degenerative changes of the left knee. There is diffuse subcutaneous edema. IMPRESSION: 1. No acute fracture or dislocation. 2. Osteopenia. Electronically Signed   By: Anner Crete M.D.   On: 11/15/2020 15:14   DG Tibia/Fibula Right  Result Date: 11/15/2020 CLINICAL DATA:  Fall.  Leg pain.  Cellulitis and swelling. EXAM: RIGHT TIBIA AND FIBULA - 2 VIEW COMPARISON:  None. FINDINGS: Pronounced artifact on the lateral views. Nonspecific regional soft tissue swelling. Arterial calcification is noted. Benign medullary calcification in the distal tibia. Ordinary osteoarthritis of the knee joint. IMPRESSION: Nonspecific soft tissue swelling. Electronically Signed   By: Nelson Chimes M.D.   On: 11/15/2020 15:12   DG Chest Port 1 View  Result Date: 11/15/2020 CLINICAL DATA:  Lower extremity swelling.  Sepsis. EXAM: PORTABLE CHEST 1 VIEW COMPARISON:  Single-view of the chest 11/12/2020. PA and lateral chest 08/29/2020. CT chest 10/25/2020. FINDINGS: The lungs are emphysematous but clear. Heart size is normal. Aortic atherosclerosis. No pneumothorax or pleural fluid. No acute or focal  bony abnormality. IMPRESSION: No acute disease. Aortic Atherosclerosis (ICD10-I70.0) and Emphysema (ICD10-J43.9). Electronically Signed   By: Inge Rise M.D.   On: 11/15/2020 15:10    Procedures .Critical Care  Date/Time: 11/15/2020 5:28 PM Performed by: Tacy Learn, PA-C Authorized by: Tacy Learn, PA-C   Critical care provider statement:    Critical care time (minutes):  45   Critical care was time spent personally by me on the following activities:  Discussions with consultants, evaluation of patient's  response to treatment, examination of patient, ordering and performing treatments and interventions, ordering and review of laboratory studies, ordering and review of radiographic studies, pulse oximetry, re-evaluation of patient's condition, obtaining history from patient or surrogate and review of old charts   Medications Ordered in ED Medications  lactated ringers infusion (has no administration in time range)  ceFEPIme (MAXIPIME) 2 g in sodium chloride 0.9 % 100 mL IVPB (has no administration in time range)  levofloxacin (LEVAQUIN) IVPB 750 mg (0 mg Intravenous Stopped 11/15/20 1622)  lactated ringers bolus 1,000 mL (0 mLs Intravenous Stopped 11/15/20 1535)    And  lactated ringers bolus 1,000 mL (0 mLs Intravenous Stopped 11/15/20 1709)    And  lactated ringers bolus 250 mL (250 mLs Intravenous New Bag/Given 11/15/20 1714)    ED Course  I have reviewed the triage vital signs and the nursing notes.  Pertinent labs & imaging results that were available during my care of the patient were reviewed by me and considered in my medical decision making (see chart for details).  Clinical Course as of 11/15/20 1729  Mon Nov 16, 6846  2385 80 year old female return to the emergency room with concern for worsening infection in her legs and pain in her legs.  Patient was seen in the emergency room by myself 3 days ago, given Dalvance and was discharged home with plan for follow-up with infectious disease.  Patient's daughter was concerned that her legs were worsening and brought her back to the emergency room. On exam, swelling seems to have improved compared to her last ED visit, there is trace drainage from the left leg which is also significant improvement. P pulses present by Doppler.  Known PAD. Patient was hypotensive on arrival with blood pressure of 86/51, had improved initially when placed in the room with systolic of 950 however on recheck had systolic pressure of 76 which prompted sepsis  work-up. Patient's daughter Anderson Malta is at bedside and states that she is not able to care for herself at home at this time and has fallen 4 times in the past week. Patient denies this, states that she is perfectly capable of caring for herself and plans to go home today. Review of labs, CBC with slightly worsening hemoglobin of 8.3 today, 8.9 at previous visit and 10.1 prior.  Patient states there is no blood in her stool and she refuses a rectal exam for Hemoccult today. Plan is to continue with IV antibiotics and IV fluids, x-ray of lower extremities ordered due to complaint of ongoing pain in the legs although suspect this may be likely due to her PAD. At this time, patient plans to leave today, discussed with patient concerns for her low blood pressure which may be indication of infection and could lead to worsening of condition or death, patient states that she has a blood pressure cuff at home and can monitor herself at home, would like to be discharged when work-up is complete. [LM]  1529 Patient's daughter Anderson Malta has  returned to the emergency room, further discussion with patient and her family, patient is agreeable to stay in the hospital for further monitoring.  Her blood pressure has improved with IV fluids. CMP without significant changes, lactic acid reassuring at 0.9, urinalysis is pending. [LM]  1727 Urine result returns positive for nitrites and leukocytes with many bacteria and greater than 50 white blood cells.  Plan is to admit for urosepsis, case discussed with Dr. Denton Brick with [facility] hospitalist service will consult for admission. [LM]    Clinical Course User Index [LM] Roque Lias   MDM Rules/Calculators/A&P                          Final Clinical Impression(s) / ED Diagnoses Final diagnoses:  Sepsis without acute organ dysfunction, due to unspecified organism ([place])  Anemia, unspecified type  Hypotension, unspecified hypotension type    Rx / DC Orders ED  Discharge Orders     None        Tacy Learn, PA-C 11/15/20 1729    Sherwood Gambler, MD 11/16/20 [phone]

## 2020-11-15 NOTE — ED Notes (Signed)
ED Provider at bedside. Provider using doppler to bilateral feet at this time

## 2020-11-15 NOTE — Progress Notes (Signed)
Pharmacy Antibiotic Note  Charlotte Henry is a 80 y.o. female admitted on 11/15/2020 with sepsis.  Pharmacy has been consulted for Cefepime dosing.  Patient received Dalvance 1500 mg IV x 1 dose on 6/10  Plan: Levaquin 750 mg IV x 1 dose in ED. Will start cefepime 2000 mg IV every 24 hours when next dose due. Holding vancomycin since patient received Dalvance on 6/10. Monitor labs, c/s, and patient improvement.  Height: 5\' 7"  (170.2 cm) Weight: 67 kg (147 lb 11.3 oz) IBW/kg (Calculated) : 61.6  Temp (24hrs), Avg:99.1 F (37.3 C), Min:99.1 F (37.3 C), Max:99.1 F (37.3 C)  Recent Labs  Lab 11/12/20 1724  WBC 7.2  CREATININE 1.53*    Estimated Creatinine Clearance: 28 mL/min (A) (by C-G formula based on SCr of 1.53 mg/dL (H)).    Allergies  Allergen Reactions   Ace Inhibitors Hives, Shortness Of Breath and Swelling   Aspirin Other (See Comments)    Blood in stool and nose bleed   Codeine Swelling   Pineapple Shortness Of Breath and Swelling   Plasticized Base [Plastibase] Hives and Swelling    No plastics   Ultram [Tramadol] Swelling   Adhesive [Tape] Swelling and Rash   Naprosyn [Naproxen] Swelling   Biaxin [Clarithromycin] Nausea And Vomiting and Other (See Comments)    Dizziness.   Clindamycin/Lincomycin Other (See Comments)    Burning sensation throat, abdomen   Linzess [Linaclotide]     DIARRHEA, FECAL INCONTINENCE   Metronidazole     NAUSEA AND WEAKNESS   Nsaids    Penicillins     Lip swelling, Hives Has patient had a PCN reaction causing immediate rash, facial/tongue/throat swelling, SOB or lightheadedness with hypotension:YES Has patient had a PCN reaction causing severe rash involving mucus membranes or skin necrosis: NO Has patient had a PCN reaction that required hospitalization NO Has patient had a PCN reaction occurring within the last 10 years: NO If all of the above answers are "NO", then may proceed with Cephalosporin use.   Varenicline Tartrate  Swelling   Xarelto [Rivaroxaban]     Bleeding    Antimicrobials this admission: Cefepime 6/14 >> Levaquin 6/13 Dalvance 6/10    Microbiology results: 6/13 BCx: pending 6/13 UCx: pending    Thank you for allowing pharmacy to be a part of this patient's care.  Ramond Craver 11/15/2020 2:47 PM

## 2020-11-15 NOTE — ED Triage Notes (Signed)
Pt seen here on 6/10 and diagnosed with cellulitis of b/l lower legs. IV abx given. Pt reports increasing pain.

## 2020-11-16 ENCOUNTER — Encounter (HOSPITAL_COMMUNITY): Payer: Self-pay | Admitting: Internal Medicine

## 2020-11-16 ENCOUNTER — Other Ambulatory Visit (HOSPITAL_COMMUNITY): Payer: Medicare Other

## 2020-11-16 DIAGNOSIS — L8990 Pressure ulcer of unspecified site, unspecified stage: Secondary | ICD-10-CM | POA: Insufficient documentation

## 2020-11-16 LAB — RETICULOCYTES
Immature Retic Fract: 14.1 % (ref 2.3–15.9)
RBC.: 2.67 MIL/uL — ABNORMAL LOW (ref 3.87–5.11)
Retic Count, Absolute: 47.5 10*3/uL (ref 19.0–186.0)
Retic Ct Pct: 1.8 % (ref 0.4–3.1)

## 2020-11-16 LAB — CBC
HCT: 24.5 % — ABNORMAL LOW (ref 36.0–46.0)
Hemoglobin: 7.9 g/dL — ABNORMAL LOW (ref 12.0–15.0)
MCH: 30 pg (ref 26.0–34.0)
MCHC: 32.2 g/dL (ref 30.0–36.0)
MCV: 93.2 fL (ref 80.0–100.0)
Platelets: 252 10*3/uL (ref 150–400)
RBC: 2.63 MIL/uL — ABNORMAL LOW (ref 3.87–5.11)
RDW: 13.3 % (ref 11.5–15.5)
WBC: 6.1 10*3/uL (ref 4.0–10.5)
nRBC: 0 % (ref 0.0–0.2)

## 2020-11-16 LAB — BASIC METABOLIC PANEL
Anion gap: 4 — ABNORMAL LOW (ref 5–15)
BUN: 32 mg/dL — ABNORMAL HIGH (ref 8–23)
CO2: 26 mmol/L (ref 22–32)
Calcium: 8.8 mg/dL — ABNORMAL LOW (ref 8.9–10.3)
Chloride: 105 mmol/L (ref 98–111)
Creatinine, Ser: 1.38 mg/dL — ABNORMAL HIGH (ref 0.44–1.00)
GFR, Estimated: 38 mL/min — ABNORMAL LOW (ref >60)
Glucose, Bld: 87 mg/dL (ref 70–99)
Potassium: 4.9 mmol/L (ref 3.5–5.1)
Sodium: 135 mmol/L (ref 135–145)

## 2020-11-16 LAB — IRON AND TIBC
Iron: 34 ug/dL (ref 28–170)
Saturation Ratios: 18 % (ref 10.4–31.8)
TIBC: 186 ug/dL — ABNORMAL LOW (ref 250–450)
UIBC: 152 ug/dL

## 2020-11-16 LAB — VITAMIN B12: Vitamin B-12: 693 pg/mL (ref 180–914)

## 2020-11-16 LAB — FOLATE: Folate: 10.5 ng/mL (ref >5.9)

## 2020-11-16 LAB — FERRITIN: Ferritin: 492 ng/mL — ABNORMAL HIGH (ref 11–307)

## 2020-11-16 MED ORDER — SODIUM CHLORIDE 0.9 % IV SOLN: INTRAVENOUS | Status: DC

## 2020-11-16 NOTE — Evaluation (Addendum)
Physical Therapy Evaluation Patient Details Name: Charlotte Henry MRN: [id number] DOB: 01/09/1941 Today's Date: 11/16/2020   History of Present Illness  Charlotte Henry is a 80 y.o. female with medical history significant for hypertension, peripheral vascular disease, CKD 3, COPD, pacemaker status.  Presented to the ED with complaints of increasing lower extremity pain, weakness, and falls over the past week.  The ED 6/10, was diagnosed with bilateral lower extremity cellulitis left greater than right with left leg at least twice as big as right leg, with drainage.  Bilateral venous Dopplers were negative for DVT.  She was given a dose of dalbavancin, and discharged home.   Patient was brought back to the ED with reports of increasing pain in her legs.  But on further questioning, daughter agrees lower extremities appear better than 3 days ago.  Patient reports pain with urination, with urinary incontinence, urgency.  She reports good oral intake, no vomiting no loose stools.  She denies dizziness.  Patient has a history of falls, but over the past week falls have been more frequent.  Patient reports her last fall was Friday prior to ED visit, she believes she lost consciousness.  But denies chest pain or difficulty breathing.   Clinical Impression  Patient presents in bed with daughter present in room. Patient required mod/min assist for bed mobility. Patient demonstrated fair seated balance at EOB. Patient required mod assist for sit to stand and stand pivot transfer with verbal and tactile cues for proper use of RW. Patient limited to a few steps at bedside due to weakness and poor standing balance.  Patient was left sitting in chair with daughter present in room. Patient will benefit from continued physical therapy in hospital and recommended venue below to increase strength, balance, endurance for safe ADLs and gait.     Follow Up Recommendations SNF;Supervision for mobility/OOB;Supervision -  Intermittent    Equipment Recommendations       Recommendations for Other Services       Precautions / Restrictions Precautions Precautions: Fall Restrictions Weight Bearing Restrictions: No      Mobility  Bed Mobility Overal bed mobility: Needs Assistance Bed Mobility: Supine to Sit     Supine to sit: Mod assist     General bed mobility comments: slow labored movement with LE assist Patient Response: Cooperative  Transfers Overall transfer level: Needs assistance Equipment used: Rolling walker (2 wheeled) Transfers: Stand Pivot Transfers;Sit to/from Stand Sit to Stand: Mod assist Stand pivot transfers: Mod assist       General transfer comment: slow labored movement  Ambulation/Gait Ambulation/Gait assistance: Mod assist Gait Distance (Feet): 4 Feet Assistive device: Rolling walker (2 wheeled) Gait Pattern/deviations: Decreased step length - left;Decreased step length - right;Decreased stride length;Trunk flexed Gait velocity: decreased   General Gait Details: slow labored movement with verbal and tactiles cues  Stairs            Wheelchair Mobility    Modified Rankin (Stroke Patients Only)       Balance Overall balance assessment: Needs assistance Sitting-balance support: Bilateral upper extremity supported;Feet supported Sitting balance-Leahy Scale: Poor Sitting balance - Comments: at EOB     Standing balance-Leahy Scale: Poor Standing balance comment: wiht RW                             Pertinent Vitals/Pain Pain Assessment: No/denies pain    Home Living Family/patient expects to be discharged to:: Private residence  Living Arrangements: Alone Available Help at Discharge: Family;Available PRN/intermittently Type of Home: House Home Access: Stairs to enter Entrance Stairs-Rails: None Entrance Stairs-Number of Steps: 2 Home Layout: One level Home Equipment: Walker - 2 wheels;Cane - single point;Toilet riser;Grab bars -  tub/shower;Bedside commode      Prior Function Level of Independence: Independent with assistive device(s)         Comments: Household ambulator with singe point cane, daughter report that she ambualtes to [facility] and back to bed     Hand Dominance   Dominant Hand: Right    Extremity/Trunk Assessment   Upper Extremity Assessment Upper Extremity Assessment: Generalized weakness    Lower Extremity Assessment Lower Extremity Assessment: Generalized weakness    Cervical / Trunk Assessment Cervical / Trunk Assessment: Kyphotic  Communication   Communication: No difficulties  Cognition Arousal/Alertness: Awake/alert Behavior During Therapy: WFL for tasks assessed/performed Overall Cognitive Status: Within Functional Limits for tasks assessed                                        General Comments      Exercises     Assessment/Plan    PT Assessment Patient needs continued PT services  PT Problem List Decreased strength;Decreased activity tolerance;Decreased balance;Decreased mobility;Decreased coordination;Decreased safety awareness       PT Treatment Interventions DME instruction;Gait training;Functional mobility training;Therapeutic activities;Therapeutic exercise;Balance training;Patient/family education    PT Goals (Current goals can be found in the Care Plan section)  Acute Rehab PT Goals Patient Stated Goal: return home PT Goal Formulation: With patient/family Time For Goal Achievement: 11/30/20 Potential to Achieve Goals: Fair    Frequency Min 3X/week   Barriers to discharge        Co-evaluation               AM-PAC PT "6 Clicks" Mobility  Outcome Measure Help needed turning from your back to your side while in a flat bed without using bedrails?: A Little Help needed moving from lying on your back to sitting on the side of a flat bed without using bedrails?: A Lot Help needed moving to and from a bed to a chair (including a  wheelchair)?: A Lot Help needed standing up from a chair using your arms (e.g., wheelchair or bedside chair)?: A Lot Help needed to walk in hospital room?: A Lot Help needed climbing 3-5 steps with a railing? : Total 6 Click Score: 12    End of Session   Activity Tolerance: Patient tolerated treatment well;Patient limited by fatigue Patient left: in chair;with call bell/phone within reach;with bed alarm set;with family/visitor present Nurse Communication: Mobility status PT Visit Diagnosis: Unsteadiness on feet (R26.81);Other abnormalities of gait and mobility (R26.89);Muscle weakness (generalized) (M62.81)    Time: 5427-0623 PT Time Calculation (min) (ACUTE ONLY): 31 min   Charges:   PT Evaluation $PT Eval Moderate Complexity: 1 Mod PT Treatments $Therapeutic Activity: 23-37 mins      1:40 PM, 11/16/20 Deke Tilghman Cousler SPT  1:40 PM, 11/16/20 Lonell Grandchild, MPT Physical Therapist with [facility] [phone] office [phone] mobile phone

## 2020-11-16 NOTE — Plan of Care (Addendum)
  Problem: Acute Rehab PT Goals(only PT should resolve) Goal: Pt Will Go Sit To Supine/Side Outcome: Progressing Flowsheets (Taken 11/16/2020 1244) Pt will go Sit to Supine/Side:  with min guard assist  with supervision Goal: Patient Will Perform Sitting Balance Outcome: Progressing Flowsheets (Taken 11/16/2020 1244) Patient will perform sitting balance:  1-2 min  with minimal assist  with unilateral UE support Goal: Patient Will Transfer Sit To/From Stand Outcome: Progressing Flowsheets (Taken 11/16/2020 1244) Patient will transfer sit to/from stand:  with min guard assist  with supervision Goal: Pt Will Ambulate Outcome: Progressing Flowsheets (Taken 11/16/2020 1244) Pt will Ambulate:  15 feet  with supervision  with rolling walker   12:45 PM, 11/16/20 Jeneen Rinks Cousler SPT  1:38 PM, 11/16/20 Lonell Grandchild, MPT Physical Therapist with [facility] [phone] office [phone] mobile phone

## 2020-11-16 NOTE — Progress Notes (Signed)
PROGRESS NOTE   Charlotte Henry  GBT:[id number] DOB: [birth year] DOA: 11/15/2020 PCP: Patient, No Pcp Per (Inactive)   Chief Complaint  Patient presents with   Leg Swelling   Level of care: Telemetry  Brief Admission History:  80 y.o. female with medical history significant for hypertension, peripheral vascular disease, CKD 3, COPD, pacemaker status.  Presented to the ED with complaints of increasing lower extremity pain, weakness, and falls over the past week.  The ED 6/10, was diagnosed with bilateral lower extremity cellulitis left greater than right with left leg at least twice as big as right leg, with drainage.  Bilateral venous Dopplers were negative for DVT.  She was given a dose of dalbavancin, and discharged home.     Patient was brought back to the ED with reports of increasing pain in her legs.  But on further questioning, daughter agrees lower extremities appear better than 3 days ago.  Patient reports pain with urination, with urinary incontinence, urgency.  She reports good oral intake, no vomiting no loose stools.  She denies dizziness.   Patient has a history of falls, but over the past week falls have been more frequent.  Patient reports her last fall was Friday prior to ED visit, she believes she lost consciousness.  But denies chest pain or difficulty breathing.   ED Course: Temp 99.1.  Heart rate 60s.  Respiratory rate 16-19.  Blood pressure systolic 71/06 improved to 135/65 after IV fluids.  O2 sats greater than 96% on room air.  WBC 5.9.  UA suggestive of UTI.  Chest x-ray unremarkable.  Lower extremity Xrays obtained, showed right- nonspecific soft tissue swelling. Left unremarkable.  With hypotension and concern for sepsis, broad-spectrum antibiotics was initiated with IV cefepime and Levaquin.  ED provider talked to pharmacy-no need for vancomycin as patient had received dalbavancin.  Assessment & Plan:   Principal Problem:   Hypotension Active Problems:   Essential  hypertension   Chronic kidney disease (CKD), stage III (moderate) (HCC)   Peripheral neuropathy   CHB (complete heart block) (HCC)   Acute lower UTI   Cellulitis   Status cardiac pacemaker   Multiple falls   Pressure injury of skin   UTI - Pt was started on aztreonam given multiple drug allergies, plan to stop after 3 days.  Follow urine culture and sensitivity data.  Continue supportive measures.    Cellulitis of BLEs - this has been fully treated with dalbavancin on 6/10 and this is healing up very well.    Hypotension - from dehydration, responded well to IV fluid  hydration.  Frequent falls at home - daughter reports at least 6 falls at home in the last week. I have asked for a PT evaluation and likely will need SNF rehab placement.    Stage 3b CKD - stable, following.   Pressure injury of skin - stage 2 sacral -  wound care protocol Pressure Injury 11/15/20 Sacrum Medial Stage 2 -  Partial thickness loss of dermis presenting as a shallow open injury with a red, pink wound bed without slough. Nickle size pink opening of the skin (Active)  11/15/20 2120  Location: Sacrum  Location Orientation: Medial  Staging: Stage 2 -  Partial thickness loss of dermis presenting as a shallow open injury with a red, pink wound bed without slough.  Wound Description (Comments): Nickle size pink opening of the skin  Present on Admission: Yes    DVT prophylaxis: enoxaparin  Code Status: DNR  Family  Communication: daughter updated telephone 6/14  Disposition: anticipating SNF  Status is: Inpatient  Remains inpatient appropriate because:IV treatments appropriate due to intensity of illness or inability to take PO and Inpatient level of care appropriate due to severity of illness  Dispo: The patient is from: Home              Anticipated d/c is to: SNF              Patient currently is not medically stable to d/c.   Difficult to place patient No   Consultants:  PT  Procedures:     Antimicrobials:  Aztreonam 6/13>>  Subjective: Pt says she is already starting to feel better.    Objective: Vitals:   11/15/20 2106 11/16/20 0225 11/16/20 0644 11/16/20 1001  BP: (!) 127/52 118/74 (!) 117/56 (!) 99/45  Pulse: (!) 58 63 (!) 59 (!) 59  Resp: 18 16 18 16   Temp: 98.2 F (36.8 C) 98.6 F (37 C) 98.3 F (36.8 C) 99.3 F (37.4 C)  TempSrc:    Oral  SpO2: 100% 97% 94% 97%  Weight:      Height:        Intake/Output Summary (Last 24 hours) at 11/16/2020 1153 Last data filed at 11/16/2020 0900 Gross per 24 hour  Intake 2970 ml  Output --  Net 2970 ml   Filed Weights   11/15/20 1345  Weight: 67 kg   Examination:  General exam: frail, elderly female, speaks in full sentences, appears calm and comfortable  Respiratory system: Clear to auscultation. Respiratory effort normal. Cardiovascular system: normal S1 & S2 heard. No JVD, murmurs, rubs, gallops or clicks. No pedal edema. Gastrointestinal system: Abdomen is nondistended, soft and nontender. No organomegaly or masses felt. Normal bowel sounds heard. Central nervous system: Alert and oriented. No focal neurological deficits. Extremities: Symmetric 5 x 5 power. Skin: No rashes, lesions or ulcers Psychiatry: Judgement and insight appear normal. Mood & affect appropriate.   Data Reviewed: I have personally reviewed following labs and imaging studies  CBC: Recent Labs  Lab 11/12/20 1724 11/15/20 1437 11/16/20 0458  WBC 7.2 5.9 6.1  NEUTROABS 4.2 2.7  --   HGB 8.9* 8.3* 7.9*  HCT 27.7* 25.7* 24.5*  MCV 93.0 93.1 93.2  PLT 275 281 053    Basic Metabolic Panel: Recent Labs  Lab 11/12/20 1724 11/15/20 1437 11/16/20 0458  NA 136 135 135  K 4.8 4.5 4.9  CL 102 104 105  CO2 28 25 26   GLUCOSE 131* 82 87  BUN 33* 36* 32*  CREATININE 1.53* 1.46* 1.38*  CALCIUM 9.3 8.9 8.8*    GFR: Estimated Creatinine Clearance: 31.1 mL/min (A) (by C-G formula based on SCr of 1.38 mg/dL (H)).  Liver Function  Tests: Recent Labs  Lab 11/12/20 1724 11/15/20 1437  AST 20 22  ALT 11 13  ALKPHOS 61 58  BILITOT 0.3 0.4  PROT 7.2 6.8  ALBUMIN 3.6 3.5    CBG: Recent Labs  Lab 11/15/20 1923  GLUCAP 106*    Recent Results (from the past 240 hour(s))  Resp Panel by RT-PCR (Flu A&B, Covid) Nasopharyngeal Swab     Status: None   Collection Time: 11/15/20  2:14 PM   Specimen: Nasopharyngeal Swab; Nasopharyngeal(NP) swabs in vial transport medium  Result Value Ref Range Status   SARS Coronavirus 2 by RT PCR NEGATIVE NEGATIVE Final    Comment: (NOTE) SARS-CoV-2 target nucleic acids are NOT DETECTED.  The SARS-CoV-2 RNA is generally  detectable in upper respiratory specimens during the acute phase of infection. The lowest concentration of SARS-CoV-2 viral copies this assay can detect is 138 copies/mL. A negative result does not preclude SARS-Cov-2 infection and should not be used as the sole basis for treatment or other patient management decisions. A negative result may occur with  improper specimen collection/handling, submission of specimen other than nasopharyngeal swab, presence of viral mutation(s) within the areas targeted by this assay, and inadequate number of viral copies(<138 copies/mL). A negative result must be combined with clinical observations, patient history, and epidemiological information. The expected result is Negative.  Fact Sheet for Patients:  EntrepreneurPulse.com.au  Fact Sheet for Healthcare Providers:  IncredibleEmployment.be  This test is no t yet approved or cleared by the [place] FDA and  has been authorized for detection and/or diagnosis of SARS-CoV-2 by FDA under an Emergency Use Authorization (EUA). This EUA will remain  in effect (meaning this test can be used) for the duration of the COVID-19 declaration under Section 564(b)(1) of the Act, 21 U.S.C.section 360bbb-3(b)(1), unless the authorization is terminated   or revoked sooner.       Influenza A by PCR NEGATIVE NEGATIVE Final   Influenza B by PCR NEGATIVE NEGATIVE Final    Comment: (NOTE) The Xpert Xpress SARS-CoV-2/FLU/RSV plus assay is intended as an aid in the diagnosis of influenza from Nasopharyngeal swab specimens and should not be used as a sole basis for treatment. Nasal washings and aspirates are unacceptable for Xpert Xpress SARS-CoV-2/FLU/RSV testing.  Fact Sheet for Patients: EntrepreneurPulse.com.au  Fact Sheet for Healthcare Providers: IncredibleEmployment.be  This test is not yet approved or cleared by the [place] FDA and has been authorized for detection and/or diagnosis of SARS-CoV-2 by FDA under an Emergency Use Authorization (EUA). This EUA will remain in effect (meaning this test can be used) for the duration of the COVID-19 declaration under Section 564(b)(1) of the Act, 21 U.S.C. section 360bbb-3(b)(1), unless the authorization is terminated or revoked.  Performed at [facility], [address] [place], [place] [address]   Blood Culture (routine x 2)     Status: None (Preliminary result)   Collection Time: 11/15/20  2:37 PM   Specimen: BLOOD  Result Value Ref Range Status   Specimen Description BLOOD BLOOD RIGHT FOREARM  Final   Special Requests   Final    Blood Culture adequate volume BOTTLES DRAWN AEROBIC AND ANAEROBIC   Culture   Final    NO GROWTH < 24 HOURS Performed at [facility] [address] [place], [place] [address]    Report Status PENDING  Incomplete  Blood Culture (routine x 2)     Status: None (Preliminary result)   Collection Time: 11/15/20  2:37 PM   Specimen: BLOOD  Result Value Ref Range Status   Specimen Description BLOOD BLOOD LEFT ARM  Final   Special Requests   Final    Blood Culture adequate volume BOTTLES DRAWN AEROBIC AND ANAEROBIC   Culture   Final    NO GROWTH < 24 HOURS Performed at [facility], [address],  [place], [place] [address]    Report Status PENDING  Incomplete     Radiology Studies: DG Tibia/Fibula Left  Result Date: 11/15/2020 CLINICAL DATA:  80 year old female with fall and left leg pain. EXAM: LEFT TIBIA AND FIBULA - 2 VIEW COMPARISON:  Left knee radiograph dated 03/21/2012. FINDINGS: There is no acute fracture or dislocation. The bones are osteopenic. There is degenerative changes of the  left knee. There is diffuse subcutaneous edema. IMPRESSION: 1. No acute fracture or dislocation. 2. Osteopenia. Electronically Signed   By: Anner Crete M.D.   On: 11/15/2020 15:14   DG Tibia/Fibula Right  Result Date: 11/15/2020 CLINICAL DATA:  Fall.  Leg pain.  Cellulitis and swelling. EXAM: RIGHT TIBIA AND FIBULA - 2 VIEW COMPARISON:  None. FINDINGS: Pronounced artifact on the lateral views. Nonspecific regional soft tissue swelling. Arterial calcification is noted. Benign medullary calcification in the distal tibia. Ordinary osteoarthritis of the knee joint. IMPRESSION: Nonspecific soft tissue swelling. Electronically Signed   By: Nelson Chimes M.D.   On: 11/15/2020 15:12   DG Chest Port 1 View  Result Date: 11/15/2020 CLINICAL DATA:  Lower extremity swelling.  Sepsis. EXAM: PORTABLE CHEST 1 VIEW COMPARISON:  Single-view of the chest 11/12/2020. PA and lateral chest 08/29/2020. CT chest 10/25/2020. FINDINGS: The lungs are emphysematous but clear. Heart size is normal. Aortic atherosclerosis. No pneumothorax or pleural fluid. No acute or focal bony abnormality. IMPRESSION: No acute disease. Aortic Atherosclerosis (ICD10-I70.0) and Emphysema (ICD10-J43.9). Electronically Signed   By: Inge Rise M.D.   On: 11/15/2020 15:10    Scheduled Meds:  donepezil  5 mg Oral QHS   enoxaparin (LOVENOX) injection  40 mg Subcutaneous Q24H   escitalopram  10 mg Oral Daily   Continuous Infusions:  sodium chloride 50 mL/hr at 11/16/20 0924   aztreonam 1 g (11/16/20 0927)    LOS: 1 day   Time spent: 49 mins    Zaiah Eckerson Wynetta Emery, MD How to contact the [facility] Attending or Consulting provider [place] or covering provider during after hours [place], for this patient?  Check the care team in [facility] and look for a) attending/consulting [facility] provider listed and b) the [facility] team listed Log into [email or web address] and use [place]'s universal password to access. If you do not have the password, please contact the hospital operator. Locate the [facility] provider you are looking for under [facility] and page to a number that you can be directly reached. If you still have difficulty reaching the provider, please page the [facility] (Director on Call) for the Hospitalists listed on amion for assistance.  11/16/2020, 11:53 AM

## 2020-11-16 NOTE — NC FL2 (Signed)
[place] LEVEL OF CARE SCREENING TOOL     IDENTIFICATION  Patient Name: Charlotte Henry Birthdate: May 12, 1941 Sex: female Admission Date (Current Location): 11/15/2020  [facility] and [place] Number:  [employer] and Address:  [place] [address] [place]      Provider Number: [id number]  Attending Physician Name and Address:  Murlean Iba, MD  Relative Name and Phone Number:  Cheron Every (Daughter)   [phone] (Mobile)    Current Level of Care: Hospital Recommended Level of Care: [place] Prior Approval Number:    Date Approved/Denied:   PASRR Number: [id number]  Discharge Plan: SNF    Current Diagnoses: Patient Active Problem List   Diagnosis Date Noted   Pressure injury of skin 11/16/2020   Hypotension 11/15/2020   Acute lower UTI 11/15/2020   Cellulitis 11/15/2020   Status cardiac pacemaker 11/15/2020   Multiple falls 11/15/2020   Iron deficiency anemia 04/28/2020   Cognitive change 03/25/2019   B12 deficiency 03/24/2019   AAA (abdominal aortic aneurysm) ([place]) 02/17/2019   Vertigo 01/30/2019   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[place]) 01/10/2018   Poor appetite 12/19/2017   Itching 07/19/2017   Gait instability 02/26/2017   Symptomatic anemia 08/15/2016   Protein-calorie malnutrition ([place]) 66/59/9357   Helicobacter pylori gastritis 07/26/2016   Generalized weakness 07/09/2016   Normocytic anemia 07/03/2016   CHB (complete heart block) (HCC)    COPD (chronic obstructive pulmonary disease) (HCC)    Tobacco use disorder 10/15/2015   Back pain 12/30/2014   OA (osteoarthritis) 12/30/2014   Carotid stenosis 03/11/2014   PVD (peripheral vascular disease) ([place]) 03/11/2014   Colon cancer screening 02/04/2014   Overactive bladder 12/31/2013   Rash and nonspecific skin eruption 05/19/2013   Numbness and tingling-bilat foot 03/05/2013   IBS (irritable bowel  syndrome) 12/30/2012   Peripheral neuropathy 07/01/2012   Insomnia 07/01/2012   Chronic kidney disease (CKD), stage III (moderate) ([place]) 08/25/2010   Cardiovascular disease 08/16/2009   Cerebrovascular disease 08/16/2009   Peripheral vascular disease ([place]) 08/16/2009   Type 2 diabetes mellitus with diabetic neuropathy, unspecified ([place]) 10/26/2008   Hyperlipidemia 10/26/2008   Gout, unspecified 10/26/2008   Essential hypertension 10/26/2008   Asthmatic bronchitis 10/26/2008   Gastroesophageal reflux disease 10/26/2008    Orientation RESPIRATION BLADDER Height & Weight     Self, Place  Normal Incontinent Weight: 147 lb 11.3 oz (67 kg) Height:  5\' 7"  (170.2 cm)  BEHAVIORAL SYMPTOMS/MOOD NEUROLOGICAL BOWEL NUTRITION STATUS      Continent Diet (heart healthy)  AMBULATORY STATUS COMMUNICATION OF NEEDS Skin   Limited Assist Verbally PU Stage and Appropriate Care (Stage 2 sacrum medial)                       Personal Care Assistance Level of Assistance  Bathing, Feeding, Dressing Bathing Assistance: Limited assistance Feeding assistance: Independent Dressing Assistance: Limited assistance     Functional Limitations Info  Sight, Hearing, Speech Sight Info: Adequate Hearing Info: Adequate Speech Info: Adequate    SPECIAL CARE FACTORS FREQUENCY  PT (By licensed PT)     PT Frequency: 5x/week              Contractures Contractures Info: Not present    Additional Factors Info  Code Status, Allergies Code Status Info: DNR Allergies Info: Ace inhibitors, aspirin, codeine, pineapple, plasticized base, ultram, adhesive tape; naprosyn; biaxin, clindamycin, linzess, metronidazole, nsaids, penicillins, varenicline tartrate,  xarelto           Current Medications (11/16/2020):  This is the current hospital active medication list Current Facility-Administered Medications  Medication Dose Route Frequency Provider Last Rate Last Admin   0.9 %  sodium chloride infusion    Intravenous Continuous Johnson, Clanford L, MD 35 mL/hr at 11/16/20 1442 Rate Change at 11/16/20 1442   acetaminophen (TYLENOL) tablet 650 mg  650 mg Oral Q6H PRN Emokpae, Ejiroghene E, MD   650 mg at 11/16/20 8022   Or   acetaminophen (TYLENOL) suppository 650 mg  650 mg Rectal Q6H PRN Emokpae, Ejiroghene E, MD       aztreonam (AZACTAM) 1 g in sodium chloride 0.9 % 100 mL IVPB  1 g Intravenous Q8H Emokpae, Ejiroghene E, MD 200 mL/hr at 11/16/20 0927 1 g at 11/16/20 0927   donepezil (ARICEPT) tablet 5 mg  5 mg Oral QHS Emokpae, Ejiroghene E, MD   5 mg at 11/15/20 2139   enoxaparin (LOVENOX) injection 40 mg  40 mg Subcutaneous Q24H Emokpae, Ejiroghene E, MD   40 mg at 11/15/20 2139   escitalopram (LEXAPRO) tablet 10 mg  10 mg Oral Daily Emokpae, Ejiroghene E, MD   10 mg at 11/16/20 0924   ondansetron (ZOFRAN) tablet 4 mg  4 mg Oral Q6H PRN Emokpae, Ejiroghene E, MD       Or   ondansetron (ZOFRAN) injection 4 mg  4 mg Intravenous Q6H PRN Emokpae, Ejiroghene E, MD       polyethylene glycol (MIRALAX / GLYCOLAX) packet 17 g  17 g Oral Daily PRN Emokpae, Ejiroghene E, MD         Discharge Medications: Please see discharge summary for a list of discharge medications.  Relevant Imaging Results:  Relevant Lab Results:   Additional Information SSN [id number], Clydene Pugh, LCSW

## 2020-11-16 NOTE — TOC Initial Note (Addendum)
Transition of Care [facility]) - Initial/Assessment Note    Patient Details  Name: Charlotte Henry MRN: [id number] Date of Birth: Sep 10, 1940  [facility]) CM/SW Contact:    Ihor Gully, LCSW Phone Number: 11/16/2020, 3:51 PM  Clinical Narrative:                 Patient from home. Admitted for cellulitis. Recommended for SNF. Is unsure as to whether she wants SNF. Daughter agreeable send referrals while patient is considering. Patient states that she is fully vaccinated for COVID (Moderna at [facility]).  Expected Discharge Plan: Skilled Nursing Facility Barriers to Discharge: Continued Medical Work up   Patient Goals and CMS Choice Patient states their goals for this hospitalization and ongoing recovery are:: considering rehab      Expected Discharge Plan and Services Expected Discharge Plan: [place]       Living arrangements for the past 2 months: Single Family Home                                      Prior Living Arrangements/Services Living arrangements for the past 2 months: Single Family Home Lives with:: Self Patient language and need for interpreter reviewed:: Yes Do you feel safe going back to the place where you live?: Yes      Need for Family Participation in Patient Care: Yes (Comment) Care giver support system in place?: Yes (comment)   Criminal Activity/Legal Involvement Pertinent to Current Situation/Hospitalization: No - Comment as needed  Activities of Daily Living Home Assistive Devices/Equipment: Cane (specify quad or straight), Walker (specify type), Wheelchair ADL Screening (condition at time of admission) Patient's cognitive ability adequate to safely complete daily activities?: No Is the patient deaf or have difficulty hearing?: No Does the patient have difficulty seeing, even when wearing glasses/contacts?: No Does the patient have difficulty concentrating, remembering, or making decisions?: Yes Patient able  to express need for assistance with ADLs?: Yes Does the patient have difficulty dressing or bathing?: Yes Independently performs ADLs?: No Does the patient have difficulty walking or climbing stairs?: Yes Weakness of Legs: Both Weakness of Arms/Hands: None  Permission Sought/Granted Permission sought to share information with : Family Supports    Share Information with NAME: Cheron Every, daughter           Emotional Assessment     Affect (typically observed): Appropriate Orientation: : Oriented to Self, Oriented to Place Alcohol / Substance Use: Not Applicable Psych Involvement: No (comment)  Admission diagnosis:  Hypotension [I95.9] Hypotension, unspecified hypotension type [I95.9] Anemia, unspecified type [D64.9] Sepsis without acute organ dysfunction, due to unspecified organism [facility]) [A41.9] Patient Active Problem List   Diagnosis Date Noted   Pressure injury of skin 11/16/2020   Hypotension 11/15/2020   Acute lower UTI 11/15/2020   Cellulitis 11/15/2020   Status cardiac pacemaker 11/15/2020   Multiple falls 11/15/2020   Iron deficiency anemia 04/28/2020   Cognitive change 03/25/2019   B12 deficiency 03/24/2019   AAA (abdominal aortic aneurysm) ([place]) 02/17/2019   Vertigo 01/30/2019   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[place]) 01/10/2018   Poor appetite 12/19/2017   Itching 07/19/2017   Gait instability 02/26/2017   Symptomatic anemia 08/15/2016   Protein-calorie malnutrition ([place]) 25/10/3974   Helicobacter pylori gastritis 07/26/2016   Generalized weakness 07/09/2016   Normocytic anemia 07/03/2016   CHB (complete heart block) ([place])    COPD (  chronic obstructive pulmonary disease) (HCC)    Tobacco use disorder 10/15/2015   Back pain 12/30/2014   OA (osteoarthritis) 12/30/2014   Carotid stenosis 03/11/2014   PVD (peripheral vascular disease) ([place]) 03/11/2014   Colon cancer screening 02/04/2014   Overactive bladder 12/31/2013   Rash and  nonspecific skin eruption 05/19/2013   Numbness and tingling-bilat foot 03/05/2013   IBS (irritable bowel syndrome) 12/30/2012   Peripheral neuropathy 07/01/2012   Insomnia 07/01/2012   Chronic kidney disease (CKD), stage III (moderate) ([place]) 08/25/2010   Cardiovascular disease 08/16/2009   Cerebrovascular disease 08/16/2009   Peripheral vascular disease ([place]) 08/16/2009   Type 2 diabetes mellitus with diabetic neuropathy, unspecified ([place]) 10/26/2008   Hyperlipidemia 10/26/2008   Gout, unspecified 10/26/2008   Essential hypertension 10/26/2008   Asthmatic bronchitis 10/26/2008   Gastroesophageal reflux disease 10/26/2008   PCP:  Patient, No Pcp Per (Inactive) Pharmacy:   [facility] [address] [place] [address] Phone: [phone] Fax: [phone]     Social Determinants of Health (SDOH) Interventions    Readmission Risk Interventions No flowsheet data found.

## 2020-11-17 ENCOUNTER — Inpatient Hospital Stay (HOSPITAL_COMMUNITY): Payer: Medicare Other

## 2020-11-17 DIAGNOSIS — R296 Repeated falls: Secondary | ICD-10-CM

## 2020-11-17 DIAGNOSIS — I9589 Other hypotension: Secondary | ICD-10-CM

## 2020-11-17 DIAGNOSIS — I10 Essential (primary) hypertension: Secondary | ICD-10-CM

## 2020-11-17 DIAGNOSIS — N390 Urinary tract infection, site not specified: Secondary | ICD-10-CM

## 2020-11-17 DIAGNOSIS — N1831 Chronic kidney disease, stage 3a: Secondary | ICD-10-CM

## 2020-11-17 DIAGNOSIS — E861 Hypovolemia: Secondary | ICD-10-CM

## 2020-11-17 DIAGNOSIS — L03119 Cellulitis of unspecified part of limb: Secondary | ICD-10-CM

## 2020-11-17 LAB — CBC
HCT: 24.7 % — ABNORMAL LOW (ref 36.0–46.0)
Hemoglobin: 8.2 g/dL — ABNORMAL LOW (ref 12.0–15.0)
MCH: 30.7 pg (ref 26.0–34.0)
MCHC: 33.2 g/dL (ref 30.0–36.0)
MCV: 92.5 fL (ref 80.0–100.0)
Platelets: 259 10*3/uL (ref 150–400)
RBC: 2.67 MIL/uL — ABNORMAL LOW (ref 3.87–5.11)
RDW: 13.6 % (ref 11.5–15.5)
WBC: 5.6 10*3/uL (ref 4.0–10.5)
nRBC: 0 % (ref 0.0–0.2)

## 2020-11-17 LAB — BASIC METABOLIC PANEL
Anion gap: 5 (ref 5–15)
BUN: 29 mg/dL — ABNORMAL HIGH (ref 8–23)
CO2: 24 mmol/L (ref 22–32)
Calcium: 8.4 mg/dL — ABNORMAL LOW (ref 8.9–10.3)
Chloride: 105 mmol/L (ref 98–111)
Creatinine, Ser: 1.17 mg/dL — ABNORMAL HIGH (ref 0.44–1.00)
GFR, Estimated: 47 mL/min — ABNORMAL LOW (ref >60)
Glucose, Bld: 79 mg/dL (ref 70–99)
Potassium: 4.9 mmol/L (ref 3.5–5.1)
Sodium: 134 mmol/L — ABNORMAL LOW (ref 135–145)

## 2020-11-17 LAB — MAGNESIUM: Magnesium: 1.8 mg/dL (ref 1.7–2.4)

## 2020-11-17 MED ORDER — SODIUM CHLORIDE 0.9 % IV SOLN
1.0000 g | INTRAVENOUS | Status: DC
  Administered 2020-11-17: 1 g via INTRAVENOUS
  Filled 2020-11-17: qty 10

## 2020-11-17 NOTE — Progress Notes (Signed)
PROGRESS NOTE   Charlotte Henry  SJG:[id number] DOB: August 30, 1940 DOA: 11/15/2020 PCP: Patient, No Pcp Per (Inactive)   Chief Complaint  Patient presents with   Leg Swelling   Level of care: Telemetry  Brief Admission History:  80 y.o. female with medical history significant for hypertension, peripheral vascular disease, CKD 3, COPD, pacemaker status.  Presented to the ED with complaints of increasing lower extremity pain, weakness, and falls over the past week.  The ED 6/10, was diagnosed with bilateral lower extremity cellulitis left greater than right with left leg at least twice as big as right leg, with drainage.  Bilateral venous Dopplers were negative for DVT.  She was given a dose of dalbavancin, and discharged home.     Patient was brought back to the ED with reports of increasing pain in her legs.  But on further questioning, daughter agrees lower extremities appear better than 3 days ago.  Patient reports pain with urination, with urinary incontinence, urgency.  She reports good oral intake, no vomiting no loose stools.  She denies dizziness.   Patient has a history of falls, but over the past week falls have been more frequent.  Patient reports her last fall was Friday prior to ED visit, she believes she lost consciousness.  But denies chest pain or difficulty breathing.   ED Course: Temp 99.1.  Heart rate 60s.  Respiratory rate 16-19.  Blood pressure systolic 65/46 improved to 135/65 after IV fluids.  O2 sats greater than 96% on room air.  WBC 5.9.  UA suggestive of UTI.  Chest x-ray unremarkable.  Lower extremity Xrays obtained, showed right- nonspecific soft tissue swelling. Left unremarkable.  With hypotension and concern for sepsis, broad-spectrum antibiotics was initiated with IV cefepime and Levaquin.  ED provider talked to pharmacy-no need for vancomycin as patient had received dalbavancin.  Assessment & Plan:   Principal Problem:   Hypotension Active Problems:   Essential  hypertension   Chronic kidney disease (CKD), stage III (moderate) (HCC)   Peripheral neuropathy   CHB (complete heart block) (HCC)   Acute lower UTI   Cellulitis   Status cardiac pacemaker   Multiple falls   Pressure injury of skin   1)Klebsiella UTI -treated with aztreonam given multiple drug allergies,  -Discussed with pharmacist okay to use IV Rocephin pending further culture sensitivity data   2)Cellulitis of BLEs - this has been fully treated with dalbavancin on 6/10 and this is healing up very well.    3)Hypotension - from dehydration, responded well to IV fluid  hydration.  4)Frequent Falls at Home - daughter reports at least 6 falls at home in the last week.  PT eval appreciated recommends SNF rehab Recurrent falls---PTA pt lived at home  and did very poorly, patient has significant limitations with mobility related ADLs- this patient needs to continue to be monitored in the hospital until a SNF bed is obtained as she is not safe to go home with her current physcical limitations  5)CKD stage 3A - stable,  -Creatinine down to 1.17 from 1.53 --renally adjust medications, avoid nephrotoxic agents / dehydration  / hypotension  6)Chronic Anemia of CKD 3A- stable , hgb > 8.2  7)Pressure injury of skin - stage 2 sacral -  wound care protocol Pressure Injury 11/15/20 Sacrum Medial Stage 2 -  Partial thickness loss of dermis presenting as a shallow open injury with a red, pink wound bed without slough. Nickle size pink opening of the skin (Active)  11/15/20  2120  Location: Sacrum  Location Orientation: Medial  Staging: Stage 2 -  Partial thickness loss of dermis presenting as a shallow open injury with a red, pink wound bed without slough.  Wound Description (Comments): Nickle size pink opening of the skin  Present on Admission: Yes    DVT prophylaxis: enoxaparin  Code Status: DNR  Family Communication: daughter updated at bedside  Disposition:   SNF  Status is:  Inpatient  Remains inpatient appropriate because: Medically Stable, Awaiting transfer to SNF Rehab  Dispo: The patient is from: Home              Anticipated d/c is to: SNF-Medically Stable, Awaiting transfer to SNF Rehab              Patient currently is not medically stable to d/c.   Difficult to place patient No   Consultants:  PT  Procedures:    Antimicrobials:  Aztreonam 6/13>>  Subjective: Voiding well No fever  Or chills  No Nausea, Vomiting or Diarrhea Remains Medically Stable, Awaiting transfer to SNF Rehab  Objective: Vitals:   11/16/20 1357 11/16/20 1945 11/17/20 0640 11/17/20 1414  BP: 121/60 100/65 130/64 (!) 146/66  Pulse: (!) 59 64 60 (!) 59  Resp: 16 19 20 15   Temp: 97.9 F (36.6 C) 97.6 F (36.4 C) 98.7 F (37.1 C) 98.2 F (36.8 C)  TempSrc:   Oral   SpO2: 100% 99% 94% 99%  Weight:      Height:        Intake/Output Summary (Last 24 hours) at 11/17/2020 1654 Last data filed at 11/17/2020 1300 Gross per 24 hour  Intake 660 ml  Output --  Net 660 ml   Filed Weights   11/15/20 1345  Weight: 67 kg   Examination:  General exam: frail, elderly female, speaks in full sentences, appears calm and comfortable  Respiratory system: Clear to auscultation. Respiratory effort normal. Cardiovascular system: normal S1 & S2 heard. No JVD, murmurs Gastrointestinal system: Abdomen is nondistended, soft and nontender.  Normal bowel sounds heard. No CVA tenderness Central nervous system: Alert and oriented. Generalized weakness, but No focal neurological deficits. Extremities: Symmetric 5 x 5 power. Skin: No rashes, lesions or ulcers Psychiatry: Judgement and insight appear normal. Mood & affect appropriate.   Data Reviewed: I have personally reviewed following labs and imaging studies  CBC: Recent Labs  Lab 11/12/20 1724 11/15/20 1437 11/16/20 0458 11/17/20 0502  WBC 7.2 5.9 6.1 5.6  NEUTROABS 4.2 2.7  --   --   HGB 8.9* 8.3* 7.9* 8.2*  HCT 27.7*  25.7* 24.5* 24.7*  MCV 93.0 93.1 93.2 92.5  PLT 275 281 252 818    Basic Metabolic Panel: Recent Labs  Lab 11/12/20 1724 11/15/20 1437 11/16/20 0458 11/17/20 0502  NA 136 135 135 134*  K 4.8 4.5 4.9 4.9  CL 102 104 105 105  CO2 28 25 26 24   GLUCOSE 131* 82 87 79  BUN 33* 36* 32* 29*  CREATININE 1.53* 1.46* 1.38* 1.17*  CALCIUM 9.3 8.9 8.8* 8.4*  MG  --   --   --  1.8    GFR: Estimated Creatinine Clearance: 36.7 mL/min (A) (by C-G formula based on SCr of 1.17 mg/dL (H)).  Liver Function Tests: Recent Labs  Lab 11/12/20 1724 11/15/20 1437  AST 20 22  ALT 11 13  ALKPHOS 61 58  BILITOT 0.3 0.4  PROT 7.2 6.8  ALBUMIN 3.6 3.5    CBG: Recent Labs  Lab 11/15/20 1923  GLUCAP 106*    Recent Results (from the past 240 hour(s))  Resp Panel by RT-PCR (Flu A&B, Covid) Nasopharyngeal Swab     Status: None   Collection Time: 11/15/20  2:14 PM   Specimen: Nasopharyngeal Swab; Nasopharyngeal(NP) swabs in vial transport medium  Result Value Ref Range Status   SARS Coronavirus 2 by RT PCR NEGATIVE NEGATIVE Final    Comment: (NOTE) SARS-CoV-2 target nucleic acids are NOT DETECTED.  The SARS-CoV-2 RNA is generally detectable in upper respiratory specimens during the acute phase of infection. The lowest concentration of SARS-CoV-2 viral copies this assay can detect is 138 copies/mL. A negative result does not preclude SARS-Cov-2 infection and should not be used as the sole basis for treatment or other patient management decisions. A negative result may occur with  improper specimen collection/handling, submission of specimen other than nasopharyngeal swab, presence of viral mutation(s) within the areas targeted by this assay, and inadequate number of viral copies(<138 copies/mL). A negative result must be combined with clinical observations, patient history, and epidemiological information. The expected result is Negative.  Fact Sheet for Patients:   EntrepreneurPulse.com.au  Fact Sheet for Healthcare Providers:  IncredibleEmployment.be  This test is no t yet approved or cleared by the [place] FDA and  has been authorized for detection and/or diagnosis of SARS-CoV-2 by FDA under an Emergency Use Authorization (EUA). This EUA will remain  in effect (meaning this test can be used) for the duration of the COVID-19 declaration under Section 564(b)(1) of the Act, 21 U.S.C.section 360bbb-3(b)(1), unless the authorization is terminated  or revoked sooner.       Influenza A by PCR NEGATIVE NEGATIVE Final   Influenza B by PCR NEGATIVE NEGATIVE Final    Comment: (NOTE) The Xpert Xpress SARS-CoV-2/FLU/RSV plus assay is intended as an aid in the diagnosis of influenza from Nasopharyngeal swab specimens and should not be used as a sole basis for treatment. Nasal washings and aspirates are unacceptable for Xpert Xpress SARS-CoV-2/FLU/RSV testing.  Fact Sheet for Patients: EntrepreneurPulse.com.au  Fact Sheet for Healthcare Providers: IncredibleEmployment.be  This test is not yet approved or cleared by the [place] FDA and has been authorized for detection and/or diagnosis of SARS-CoV-2 by FDA under an Emergency Use Authorization (EUA). This EUA will remain in effect (meaning this test can be used) for the duration of the COVID-19 declaration under Section 564(b)(1) of the Act, 21 U.S.C. section 360bbb-3(b)(1), unless the authorization is terminated or revoked.  Performed at [facility], [address] [place], [place] [address]   Urine culture     Status: Abnormal (Preliminary result)   Collection Time: 11/15/20  2:14 PM   Specimen: In/Out Cath Urine  Result Value Ref Range Status   Specimen Description   Final    IN/OUT CATH URINE Performed at [facility], [address] [place], [place] [address]    Special Requests   Final    NONE Performed  at [facility], [address] [place], [place] [address]    Culture (A)  Final    10,000 COLONIES/mL KLEBSIELLA OXYTOCA SUSCEPTIBILITIES TO FOLLOW Performed at [facility], [place] [address] [place],  [address]    Report Status PENDING  Incomplete  Blood Culture (routine x 2)     Status: None (Preliminary result)   Collection Time: 11/15/20  2:37 PM   Specimen: BLOOD  Result Value Ref Range Status   Specimen Description BLOOD BLOOD RIGHT FOREARM  Final  Special Requests   Final    Blood Culture adequate volume BOTTLES DRAWN AEROBIC AND ANAEROBIC   Culture   Final    NO GROWTH 2 DAYS Performed at [facility], [address] [place], [place] [address]    Report Status PENDING  Incomplete  Blood Culture (routine x 2)     Status: None (Preliminary result)   Collection Time: 11/15/20  2:37 PM   Specimen: BLOOD  Result Value Ref Range Status   Specimen Description BLOOD BLOOD LEFT ARM  Final   Special Requests   Final    Blood Culture adequate volume BOTTLES DRAWN AEROBIC AND ANAEROBIC   Culture   Final    NO GROWTH 2 DAYS Performed at [facility], [address], [place], [place] [address]    Report Status PENDING  Incomplete     Radiology Studies: No results found.  Scheduled Meds:  donepezil  5 mg Oral QHS   enoxaparin (LOVENOX) injection  40 mg Subcutaneous Q24H   escitalopram  10 mg Oral Daily   Continuous Infusions:  sodium chloride 35 mL/hr at 11/16/20 1442   aztreonam 1 g (11/17/20 1637)    LOS: 2 days   Roxan Hockey, MD How to contact the [facility] Attending or Consulting provider [place] or covering provider during after hours [place], for this patient?  Check the care team in [facility] and look for a) attending/consulting TRH provider listed and b) the [facility] team listed Log into [email or web address] and use [facility]'s universal password to access. If you do not have the password, please contact the hospital operator. Locate the [facility] provider you are looking for  under [facility] and page to a number that you can be directly reached. If you still have difficulty reaching the provider, please page the [facility] (Director on Call) for the Hospitalists listed on amion for assistance.  11/17/2020, 4:54 PM

## 2020-11-18 ENCOUNTER — Inpatient Hospital Stay
Admission: RE | Admit: 2020-11-18 | Discharge: 2020-12-03 | Disposition: A | Discharge status: Home / Self Care | Payer: Medicare Other | Source: Ambulatory Visit | Attending: Internal Medicine | Admitting: Internal Medicine

## 2020-11-18 ENCOUNTER — Inpatient Hospital Stay (HOSPITAL_COMMUNITY): Payer: Medicare Other

## 2020-11-18 DIAGNOSIS — I361 Nonrheumatic tricuspid (valve) insufficiency: Secondary | ICD-10-CM

## 2020-11-18 DIAGNOSIS — R55 Syncope and collapse: Secondary | ICD-10-CM | POA: Diagnosis not present

## 2020-11-18 LAB — BASIC METABOLIC PANEL
Anion gap: 4 — ABNORMAL LOW (ref 5–15)
BUN: 25 mg/dL — ABNORMAL HIGH (ref 8–23)
CO2: 25 mmol/L (ref 22–32)
Calcium: 8.4 mg/dL — ABNORMAL LOW (ref 8.9–10.3)
Chloride: 104 mmol/L (ref 98–111)
Creatinine, Ser: 1.05 mg/dL — ABNORMAL HIGH (ref 0.44–1.00)
GFR, Estimated: 53 mL/min — ABNORMAL LOW (ref >60)
Glucose, Bld: 76 mg/dL (ref 70–99)
Potassium: 4.7 mmol/L (ref 3.5–5.1)
Sodium: 133 mmol/L — ABNORMAL LOW (ref 135–145)

## 2020-11-18 LAB — CBC
HCT: 28.4 % — ABNORMAL LOW (ref 36.0–46.0)
Hemoglobin: 9.2 g/dL — ABNORMAL LOW (ref 12.0–15.0)
MCH: 29.9 pg (ref 26.0–34.0)
MCHC: 32.4 g/dL (ref 30.0–36.0)
MCV: 92.2 fL (ref 80.0–100.0)
Platelets: 285 10*3/uL (ref 150–400)
RBC: 3.08 MIL/uL — ABNORMAL LOW (ref 3.87–5.11)
RDW: 13.5 % (ref 11.5–15.5)
WBC: 5.7 10*3/uL (ref 4.0–10.5)
nRBC: 0 % (ref 0.0–0.2)

## 2020-11-18 LAB — MAGNESIUM: Magnesium: 1.9 mg/dL (ref 1.7–2.4)

## 2020-11-18 LAB — ECHOCARDIOGRAM COMPLETE
AR max vel: 2.19 cm2
AV Area VTI: 2.38 cm2
AV Area mean vel: 2.4 cm2
AV Mean grad: 9 mmHg
AV Peak grad: 16.8 mmHg
Ao pk vel: 2.05 m/s
Area-P 1/2: 2.36 cm2
Height: 67 in
S' Lateral: 2.48 cm
Weight: 2363.33 oz

## 2020-11-18 LAB — URINE CULTURE: Culture: 10000 — AB

## 2020-11-18 MED ORDER — CEPHALEXIN 500 MG PO CAPS: 500.0000 mg | ORAL_CAPSULE | Freq: Three times a day (TID) | ORAL | 0 refills | Status: AC

## 2020-11-18 MED ORDER — ACETAMINOPHEN 325 MG PO TABS: 650.0000 mg | ORAL_TABLET | Freq: Four times a day (QID) | ORAL | 0 refills | Status: AC | PRN

## 2020-11-18 MED ORDER — ONDANSETRON HCL 4 MG PO TABS: 4.0000 mg | ORAL_TABLET | Freq: Four times a day (QID) | ORAL | 0 refills | Status: DC | PRN

## 2020-11-18 NOTE — Progress Notes (Signed)
  Echocardiogram 2D Echocardiogram has been performed.  Charlotte Henry 11/18/2020, 8:54 AM

## 2020-11-18 NOTE — Discharge Instructions (Signed)
1)Avoid ibuprofen/Advil/Aleve/Motrin/Goody Powders/Naproxen/BC powders/Meloxicam/Diclofenac/Indomethacin and other Nonsteroidal anti-inflammatory medications as these will make you more likely to bleed and can cause stomach ulcers, can also cause Kidney problems.   2)Repeat CBC and BMP on Monday 11/22/20

## 2020-11-18 NOTE — TOC Transition Note (Signed)
Transition of Care [facility]) - CM/SW Discharge Note   Patient Details  Name: Charlotte Henry MRN: [id number] Date of Birth: 1940/07/18  [facility]) CM/SW Contact:  Charlotte Bence, LCSW Phone Number: 11/18/2020, 12:09 PM   Clinical Narrative:    CSW contact [facility] to inquire if they are able to take patient today. Marianna Fuss reported that they are able to take patient. Nurse to call report. TOC signing off.    Final next level of care: Skilled Nursing Facility Barriers to Discharge: Barriers Resolved   Patient Goals and CMS Choice Patient states their goals for this hospitalization and ongoing recovery are:: Rehab with SNF CMS Medicare.gov Compare Post Acute Care list provided to:: Patient Choice offered to / list presented to : Patient  Discharge Placement              Patient [facility] bed at: [facility] Patient to be transferred to facility by: [facility] Name of family member notified: Charlotte Henry (Daughter)   [phone] Patient and family notified of of transfer: 11/18/20  Discharge Plan and Services                                     Social Determinants of Health (SDOH) Interventions     Readmission Risk Interventions No flowsheet data found.

## 2020-11-18 NOTE — Care Management Important Message (Signed)
Important Message  Patient Details  Name: Charlotte Henry MRN: [id number] Date of Birth: [birth year]   Medicare Important Message Given:  Yes     Tommy Medal 11/18/2020, 11:44 AM

## 2020-11-18 NOTE — Discharge Summary (Addendum)
Charlotte Henry, is a 80 y.o. female  DOB 06-14-1940  MRN [id number].  Admission date:  11/15/2020  Admitting Physician  Bethena Roys, MD  Discharge Date:  11/18/2020   Primary MD  Patient, No Pcp Per (Inactive)  Recommendations for primary care physician for things to follow:   1)Avoid ibuprofen/Advil/Aleve/Motrin/Goody Powders/Naproxen/BC powders/Meloxicam/Diclofenac/Indomethacin and other Nonsteroidal anti-inflammatory medications as these will make you more likely to bleed and can cause stomach ulcers, can also cause Kidney problems.   2)Repeat CBC and BMP on Monday 11/22/20   Admission Diagnosis  Hypotension [I95.9] Hypotension, unspecified hypotension type [I95.9] Anemia, unspecified type [D64.9] Sepsis without acute organ dysfunction, due to unspecified organism [facility]) [A41.9]   Discharge Diagnosis  Hypotension [I95.9] Hypotension, unspecified hypotension type [I95.9] Anemia, unspecified type [D64.9] Sepsis without acute organ dysfunction, due to unspecified organism [facility]) [A41.9]    Principal Problem:   Hypotension Active Problems:   Essential hypertension   Chronic kidney disease (CKD), stage III (moderate) (HCC)   Peripheral neuropathy   CHB (complete heart block) (HCC)   Acute lower UTI   Cellulitis   Status cardiac pacemaker   Multiple falls   Pressure injury of skin      Past Medical History:  Diagnosis Date   Arterial fibromuscular dysplasia (HCC)    ASCVD (arteriosclerotic cardiovascular disease)    a. cath in 4/04,-80%LAD; 70% RCA, -> DESx2; residual 60% distal RCA; nl EF  b. Nuc 02/2016 aborted due to bradycardia. Will need to be rescheduled    Carotid artery occlusion    Cerebrovascular disease    CHB (complete heart block) ([place])    a. s/p PPM on 03/24/16 with a [employer] Jude (serial number [id number]) pacemaker   Chronic kidney disease (CKD), stage III (moderate) (HCC)     Clostridium difficile colitis 03/2013   a. 03/2013   COPD (chronic obstructive pulmonary disease) ([place])    Coronary disease    Diverticulosis    GERD (gastroesophageal reflux disease)    Gout    H pylori ulcer    Hyperlipidemia    chose to stop statin, takes Zetia   Hypertension    not currently on medication   IBS (irritable bowel syndrome)    PVD (peripheral vascular disease) (HCC)    a. moderate to severely decreased ABI's in 8/08, sig. aortic inflow disease b. repeat ABI's in 2011 improved- mild disease on the left and moderate to severe disease on the right   S/P placement of cardiac pacemaker    a. 03/24/16: St. Jude (serial number [id number]) pacemaker   Symptomatic anemia 08/15/2016   TIA (transient ischemic attack) 2012   Tobacco abuse    a. 50 pack years continuing at 1/2 pack per day   Tobacco use disorder 10/15/2015    Past Surgical History:  Procedure Laterality Date   ABDOMINAL HYSTERECTOMY     carpel tunnel release     bilateral   CHOLECYSTECTOMY     COLONOSCOPY  2009   COLONOSCOPY N/A 08/04/2016   incomplete due to  prep.    COLONOSCOPY N/A 08/05/2016   Dr. Oneida Alar: moderately redundant rectosigmoid and sigmoid, normal TI, single large-mouthed diverticulum in hepatic flexure, external hemorrhoids, path negative for microscopic colitis    ENTEROSCOPY N/A 08/18/2016   Procedure: ENTEROSCOPY;  Surgeon: Daneil Dolin, MD;  [facility];  Service: Endoscopy;  Laterality: N/A;  Pediatric colonoscope   EP IMPLANTABLE DEVICE N/A 03/24/2016   Procedure: Pacemaker Implant;  Surgeon: [name], MD;  Location: [place] [facility];  Service: Cardiovascular;  Laterality: N/A;   ESOPHAGOGASTRODUODENOSCOPY N/A 08/04/2016   Dr. Oneida Alar: non-critical Schatzki's ring, small hiatal hernia, +H.pylori gastritis on path.    GIVENS CAPSULE STUDY N/A 08/16/2016   Procedure: GIVENS CAPSULE STUDY;  Surgeon: Daneil Dolin, MD;  Location: [facility];  Service: Endoscopy;   Laterality: N/A;   TOTAL ABDOMINAL HYSTERECTOMY W/ BILATERAL SALPINGOOPHORECTOMY  1979     HPI  from the history and physical done on the day of admission:     Chief Complaint: Weakness, falls   HPI: Charlotte Henry is a 80 y.o. female with medical history significant for hypertension, peripheral vascular disease, CKD 3, COPD, pacemaker status. Presented to the ED with complaints of increasing lower extremity pain, weakness, and falls over the past week. The ED 6/10, was diagnosed with bilateral lower extremity cellulitis left greater than right with left leg at least twice as big as right leg, with drainage.  Bilateral venous Dopplers were negative for DVT.  She was given a dose of dalbavancin, and discharged home.  Patient was brought back to the ED with reports of increasing pain in her legs.  But on further questioning, daughter agrees lower extremities appear better than 3 days ago.  Patient reports pain with urination, with urinary incontinence, urgency.  She reports good oral intake, no vomiting no loose stools.  She denies dizziness. Patient has a history of falls, but over the past week falls have been more frequent.  Patient reports her last fall was Friday prior to ED visit, she believes she lost consciousness.  But denies chest pain or difficulty breathing.   ED Course: Temp 99.1.  Heart rate 60s.  Respiratory rate 16-19.  Blood pressure systolic 29/56 improved to 135/65 after IV fluids.  O2 sats greater than 96% on room air.  WBC 5.9.  UA suggestive of UTI.  Chest x-ray unremarkable.  Lower extremity Xrays obtained, showed right- nonspecific soft tissue swelling. Left unremarkable.  With hypotension and concern for sepsis, broad-spectrum antibiotics was initiated with IV cefepime and Levaquin.  ED provider talked to pharmacy-no need for vancomycin as patient had received dalbavancin.    Hospital Course:    1)Klebsiella UTI -treated with aztreonam given multiple drug  allergies, -Discussed with pharmacist , gave  IV Rocephin  -Okay to discharge on p.o. Keflex per sensitivity report  2)Cellulitis of BLEs - this has been fully treated with dalbavancin on 6/10 and this is healing up very well.   -Additional antibiotics as above #1   3)Hypotension - from dehydration, responded well to IV fluid  hydration. -Stable at this time -  4)Frequent Falls at Home - daughter reports at least 6 falls at home in the last week.  PT eval appreciated recommends SNF rehab Recurrent falls---PTA pt lived at home  and did very poorly, patient has significant limitations with mobility related ADLs- this patient needs to continue to be monitored in the hospital until a SNF bed is obtained as she is not safe  to go home with her current physcical limitations   5)Aki on CKD stage 3A - stable,  -Creatinine down to 1.0 from 1.53 --renally adjust medications, avoid nephrotoxic agents / dehydration  / hypotension   6)Chronic Anemia of CKD 3A- stable , hgb is 9.2   7)Pressure injury of skin - stage 2 sacral -  wound care protocol Pressure Injury 11/15/20 Sacrum Medial Stage 2 -  Partial thickness loss of dermis presenting as a shallow open injury with a red, pink wound bed without slough. Nickle size pink opening of the skin (Active)  11/15/20 2120  Location: Sacrum  Location Orientation: Medial  Staging: Stage 2 -  Partial thickness loss of dermis presenting as a shallow open injury with a red, pink wound bed without slough.  Wound Description (Comments): Nickle size pink opening of the skin  Present on Admission: Yes   8)HFpEF--chronic diastolic dysfunction CHF, echo from 11/18/2020 with EF of 65 to 70% with grade 1 diastolic dysfunction -Currently appears compensated/stable -Okay to restart Lasix 20 mg daily  9)H/o cardiac arrhythmia/status post pacemaker placement--- stable, routine care  Code Status: DNR Family Communication: daughter updated at bedside   Disposition:    SNF   Dispo: The patient is from: Home              Anticipated d/c is to: SNF-Medically Stable,   Consultants:  PT   Procedures:      Antimicrobials: Aztreonam 6/13>> Rocephin   Discharge Condition: stabble  Follow UP   Contact information for after-discharge care     [facility] .   Service: Skilled Nursing Contact information: [address] [place] [address] [phone]                      Diet and Activity recommendation:  As advised  Discharge Instructions   Discharge Instructions     Call MD for:  difficulty breathing, headache or visual disturbances   Complete by: As directed    Call MD for:  persistant dizziness or light-headedness   Complete by: As directed    Call MD for:  persistant nausea and vomiting   Complete by: As directed    Call MD for:  severe uncontrolled pain   Complete by: As directed    Call MD for:  temperature >100.4   Complete by: As directed    Diet - low sodium heart healthy   Complete by: As directed    Discharge instructions   Complete by: As directed    1)Avoid ibuprofen/Advil/Aleve/Motrin/Goody Powders/Naproxen/BC powders/Meloxicam/Diclofenac/Indomethacin and other Nonsteroidal anti-inflammatory medications as these will make you more likely to bleed and can cause stomach ulcers, can also cause Kidney problems.   2)Repeat CBC and BMP on Monday 11/22/20   Discharge wound care:   Complete by: As directed    As noted above   Increase activity slowly   Complete by: As directed          Discharge Medications     Allergies as of 11/18/2020       Reactions   Ace Inhibitors Hives, Shortness Of Breath, Swelling   Aspirin Other (See Comments)   Blood in stool and nose bleed   Codeine Swelling   Pineapple Shortness Of Breath, Swelling   Plasticized Base [plastibase] Hives, Swelling   No plastics   Ultram [tramadol] Swelling   Adhesive [tape]  Swelling, Rash   Naprosyn [naproxen] Swelling  Biaxin [clarithromycin] Nausea And Vomiting, Other (See Comments)   Dizziness.   Clindamycin/lincomycin Other (See Comments)   Burning sensation throat, abdomen   Linzess [linaclotide]    DIARRHEA, FECAL INCONTINENCE   Metronidazole    NAUSEA AND WEAKNESS   Nsaids    Penicillins    Lip swelling, Hives Has patient had a PCN reaction causing immediate rash, facial/tongue/throat swelling, SOB or lightheadedness with hypotension:YES Has patient had a PCN reaction causing severe rash involving mucus membranes or skin necrosis: NO Has patient had a PCN reaction that required hospitalization NO Has patient had a PCN reaction occurring within the last 10 years: NO If all of the above answers are "NO", then may proceed with Cephalosporin use.   Varenicline Tartrate Swelling   Xarelto [rivaroxaban]    Bleeding        Medication List     TAKE these medications    acetaminophen 325 MG tablet Commonly known as: TYLENOL Take 2 tablets (650 mg total) by mouth every 6 (six) hours as needed for mild pain (or Fever >/= 101).   albuterol 108 (90 Base) MCG/ACT inhaler Commonly known as: VENTOLIN HFA Inhale 2 puffs into the lungs every 4 (four) hours as needed for wheezing or shortness of breath.   calcium citrate-vitamin D 315-200 MG-UNIT tablet Commonly known as: CITRACAL+D Take 1 tablet by mouth daily.   cephALEXin 500 MG capsule Commonly known as: KEFLEX Take 1 capsule (500 mg total) by mouth 3 (three) times daily for 5 days. What changed: when to take this   clotrimazole 1 % cream Commonly known as: LOTRIMIN Apply 1 application topically 2 (two) times daily. Beneath stomach   donepezil 5 MG tablet Commonly known as: Aricept Take 1 tablet (5 mg total) by mouth at bedtime.   escitalopram 10 MG tablet Commonly known as: LEXAPRO Take 1 tablet (10 mg total) by mouth daily.   esomeprazole 40 MG capsule Commonly known as:  NEXIUM TAKE 1 CAPSULE BY MOUTH DAILY BEFORE BREAKFAST.   ezetimibe 10 MG tablet Commonly known as: ZETIA Take 1 tablet (10 mg total) by mouth daily.   ferrous sulfate 325 (65 FE) MG tablet Take 325 mg by mouth daily with breakfast.   furosemide 20 MG tablet Commonly known as: LASIX Take 1 tablet (20 mg total) by mouth daily.   lidocaine 5 % Commonly known as: Lidoderm Place 1 patch onto the skin daily. Remove & Discard patch within 12 hours or as directed by MD   multivitamin with minerals Tabs tablet Take 1 tablet by mouth daily.   nystatin powder Commonly known as: MYCOSTATIN/NYSTOP Apply 1 application topically 3 (three) times daily. Beneath breast and stomach   ondansetron 4 MG tablet Commonly known as: ZOFRAN Take 1 tablet (4 mg total) by mouth every 6 (six) hours as needed for nausea.   VITAMIN B 12 PO Take 1 tablet by mouth daily.               Discharge Care Instructions  (From admission, onward)           Start     Ordered   11/18/20 0000  Discharge wound care:       Comments: As noted above   11/18/20 1213            Major procedures and Radiology Reports - PLEASE review detailed and final reports for all details, in brief    DG Tibia/Fibula Left  Result Date: 11/15/2020 CLINICAL DATA:  80 year old female with  fall and left leg pain. EXAM: LEFT TIBIA AND FIBULA - 2 VIEW COMPARISON:  Left knee radiograph dated 03/21/2012. FINDINGS: There is no acute fracture or dislocation. The bones are osteopenic. There is degenerative changes of the left knee. There is diffuse subcutaneous edema. IMPRESSION: 1. No acute fracture or dislocation. 2. Osteopenia. Electronically Signed   By: Anner Crete M.D.   On: 11/15/2020 15:14   DG Tibia/Fibula Right  Result Date: 11/15/2020 CLINICAL DATA:  Fall.  Leg pain.  Cellulitis and swelling. EXAM: RIGHT TIBIA AND FIBULA - 2 VIEW COMPARISON:  None. FINDINGS: Pronounced artifact on the lateral views. Nonspecific  regional soft tissue swelling. Arterial calcification is noted. Benign medullary calcification in the distal tibia. Ordinary osteoarthritis of the knee joint. IMPRESSION: Nonspecific soft tissue swelling. Electronically Signed   By: Nelson Chimes M.D.   On: 11/15/2020 15:12   CT Chest Wo Contrast  Result Date: 10/27/2020 CLINICAL DATA:  Right non-small cell lung cancer, stage I, status post radiation therapy EXAM: CT CHEST WITHOUT CONTRAST TECHNIQUE: Multidetector CT imaging of the chest was performed following the standard protocol without IV contrast. COMPARISON:  08/27/2019 FINDINGS: Cardiovascular: Aortic atherosclerosis. Aortic valve calcifications. Left chest multi lead pacer. Normal heart size. Extensive 3 vessel coronary artery calcifications and/or stents. No pericardial effusion. Mediastinum/Nodes: No enlarged mediastinal, hilar, or axillary lymph nodes. Thyroid gland, trachea, and esophagus demonstrate no significant findings. Lungs/Pleura: Moderate centrilobular emphysema. Interval increase in subpleural consolidation of the paramedian right upper lobe (series 7, image 77). No pleural effusion or pneumothorax. Upper Abdomen: No acute abnormality. Musculoskeletal: No chest wall mass or suspicious bone lesions identified. IMPRESSION: 1. Interval increase in subpleural consolidation of the paramedian right upper lobe, generally consistent with expected evolution of radiation change. 2. No evidence of malignant recurrence or metastatic disease in the chest. 3. Emphysema. 4. Coronary artery disease. 5. Aortic valve calcifications. Correlate for echocardiographic evidence of aortic valve dysfunction. Aortic Atherosclerosis (ICD10-I70.0) and Emphysema (ICD10-J43.9). Electronically Signed   By: Eddie Candle M.D.   On: 10/27/2020 09:47   US Venous Img Lower Bilateral (DVT)  Result Date: 11/12/2020 CLINICAL DATA:  Swelling. EXAM: BILATERAL LOWER EXTREMITY VENOUS DOPPLER ULTRASOUND TECHNIQUE: Gray-scale  sonography with graded compression, as well as color Doppler and duplex ultrasound were performed to evaluate the lower extremity deep venous systems from the level of the common femoral vein and including the common femoral, femoral, profunda femoral, popliteal and calf veins including the posterior tibial, peroneal and gastrocnemius veins when visible. The superficial great saphenous vein was also interrogated. Spectral Doppler was utilized to evaluate flow at rest and with distal augmentation maneuvers in the common femoral, femoral and popliteal veins. COMPARISON:  None. FINDINGS: RIGHT LOWER EXTREMITY Common Femoral Vein: No evidence of thrombus. Normal compressibility, respiratory phasicity and response to augmentation. Saphenofemoral Junction: No evidence of thrombus. Normal compressibility and flow on color Doppler imaging. Profunda Femoral Vein: No evidence of thrombus. Normal compressibility and flow on color Doppler imaging. Femoral Vein: No evidence of thrombus. Normal compressibility, respiratory phasicity and response to augmentation. Popliteal Vein: No evidence of thrombus. Normal compressibility, respiratory phasicity and response to augmentation. Calf Veins: No evidence of thrombus. Normal compressibility and flow on color Doppler imaging. Other: Incidental note of peripheral arterial vascular disease and calf edema. LEFT LOWER EXTREMITY Common Femoral Vein: No evidence of thrombus. Normal compressibility, respiratory phasicity and response to augmentation. Saphenofemoral Junction: No evidence of thrombus. Normal compressibility and flow on color Doppler imaging. Profunda Femoral Vein: No evidence of  thrombus. Normal compressibility and flow on color Doppler imaging. Femoral Vein: No evidence of thrombus. Normal compressibility, respiratory phasicity and response to augmentation. Popliteal Vein: No evidence of thrombus. Normal compressibility, respiratory phasicity and response to augmentation. Calf  Veins: No evidence of thrombus. Normal compressibility and flow on color Doppler imaging. Superficial Great Saphenous Vein: No evidence of thrombus. Normal compressibility. Venous Reflux:  None. Other: Incidental note of peripheral arterial vascular disease and calf edema. IMPRESSION: 1. No evidence of deep venous thrombosis in either lower extremity. 2. Incidental note of peripheral arterial vascular disease and bilateral calf edema. Electronically Signed   By: Margaretha Sheffield MD   On: 11/12/2020 18:10   DG Chest Port 1 View  Result Date: 11/15/2020 CLINICAL DATA:  Lower extremity swelling.  Sepsis. EXAM: PORTABLE CHEST 1 VIEW COMPARISON:  Single-view of the chest 11/12/2020. PA and lateral chest 08/29/2020. CT chest 10/25/2020. FINDINGS: The lungs are emphysematous but clear. Heart size is normal. Aortic atherosclerosis. No pneumothorax or pleural fluid. No acute or focal bony abnormality. IMPRESSION: No acute disease. Aortic Atherosclerosis (ICD10-I70.0) and Emphysema (ICD10-J43.9). Electronically Signed   By: Inge Rise M.D.   On: 11/15/2020 15:10   DG Chest Port 1 View  Result Date: 11/12/2020 CLINICAL DATA:  Leg swelling.  COPD.  Hypertension.  Smoker. EXAM: PORTABLE CHEST 1 VIEW COMPARISON:  08/29/2020 FINDINGS: Midline trachea. Normal heart size. Tortuous thoracic aorta. Atherosclerosis in the transverse aorta. No pleural effusion or pneumothorax. Dual lead pacer. Diffuse peribronchial thickening. No lobar consolidation. Right upper quadrant surgical clips. IMPRESSION: 1. No acute cardiopulmonary disease. 2. Peribronchial thickening which likely relates to COPD/chronic bronchitis. 3.  Aortic Atherosclerosis (ICD10-I70.0). Electronically Signed   By: Abigail Miyamoto M.D.   On: 11/12/2020 18:35   ECHOCARDIOGRAM COMPLETE  Result Date: 11/18/2020    ECHOCARDIOGRAM REPORT   Patient Name:   Charlotte Henry Date of Exam: 11/18/2020 Medical Rec #:  [id number]       Height:       67.0 in Accession #:     [id number]      Weight:       147.7 lb Date of Birth:  22-Feb-1941       BSA:          1.778 m Patient Age:    67 years        BP:           144/61 mmHg Patient Gender: F               HR:           57 bpm. Exam Location:  [name] Procedure: 2D Echo, Cardiac Doppler and Color Doppler Indications:    Syncope  History:        Patient has prior history of Echocardiogram examinations, most                 recent 01/31/2019. CAD, Pacemaker, Carotid Disease, PVD and TIA,                 Signs/Symptoms:Syncope; Risk Factors:Hypertension, Dyslipidemia                 and Current Smoker.  Sonographer:    Dustin Flock RDCS Referring Phys: [place]  1. Left ventricular ejection fraction, by estimation, is 65 to 70%. The left ventricle has hyperdynamic function. The left ventricle has no regional wall motion abnormalities. There is mild left ventricular hypertrophy. Left ventricular diastolic parameters are consistent with  Grade I diastolic dysfunction (impaired relaxation).  2. Right ventricular systolic function is normal. The right ventricular size is normal. There is normal pulmonary artery systolic pressure.  3. Left atrial size was mildly dilated.  4. The mitral valve is normal in structure. No evidence of mitral valve regurgitation. No evidence of mitral stenosis.  5. The aortic valve is tricuspid. There is moderate calcification of the aortic valve. There is moderate thickening of the aortic valve. Aortic valve regurgitation is not visualized. No aortic stenosis is present.  6. The inferior vena cava is normal in size with greater than 50% respiratory variability, suggesting right atrial pressure of 3 mmHg. FINDINGS  Left Ventricle: Left ventricular ejection fraction, by estimation, is 65 to 70%. The left ventricle has hyperdynamic function. The left ventricle has no regional wall motion abnormalities. The left ventricular internal cavity size was normal in size. There is mild left  ventricular hypertrophy. Left ventricular diastolic parameters are consistent with Grade I diastolic dysfunction (impaired relaxation). Normal left ventricular filling pressure. Right Ventricle: The right ventricular size is normal. No increase in right ventricular wall thickness. Right ventricular systolic function is normal. There is normal pulmonary artery systolic pressure. The tricuspid regurgitant velocity is 2.57 m/s, and  with an assumed right atrial pressure of 3 mmHg, the estimated right ventricular systolic pressure is 91.4 mmHg. Left Atrium: Left atrial size was mildly dilated. Right Atrium: Right atrial size was normal in size. Pericardium: There is no evidence of pericardial effusion. Mitral Valve: The mitral valve is normal in structure. No evidence of mitral valve regurgitation. No evidence of mitral valve stenosis. Tricuspid Valve: The tricuspid valve is normal in structure. Tricuspid valve regurgitation is mild . No evidence of tricuspid stenosis. Aortic Valve: The aortic valve is tricuspid. There is moderate calcification of the aortic valve. There is moderate thickening of the aortic valve. There is moderate aortic valve annular calcification. Aortic valve regurgitation is not visualized. No aortic stenosis is present. Aortic valve mean gradient measures 9.0 mmHg. Aortic valve peak gradient measures 16.8 mmHg. Aortic valve area, by VTI measures 2.38 cm. Pulmonic Valve: The pulmonic valve was not well visualized. Pulmonic valve regurgitation is not visualized. No evidence of pulmonic stenosis. Aorta: The aortic root is normal in size and structure. Pulmonary Artery: No significant pulmonary HTN, PASP is 29 mmHg. Venous: The inferior vena cava is normal in size with greater than 50% respiratory variability, suggesting right atrial pressure of 3 mmHg. IAS/Shunts: No atrial level shunt detected by color flow Doppler.  LEFT VENTRICLE PLAX 2D LVIDd:         4.60 cm  Diastology LVIDs:         2.48 cm   LV e' medial:    7.72 cm/s LV PW:         1.10 cm  LV E/e' medial:  10.8 LV IVS:        1.14 cm  LV e' lateral:   8.27 cm/s LVOT diam:     2.20 cm  LV E/e' lateral: 10.1 LV SV:         106 LV SV Index:   59 LVOT Area:     3.80 cm  RIGHT VENTRICLE RV Basal diam:  2.81 cm RV S prime:     15.20 cm/s TAPSE (M-mode): 1.7 cm LEFT ATRIUM             Index       RIGHT ATRIUM  Index LA diam:        3.00 cm 1.69 cm/m  RA Area:     14.30 cm LA Vol (A2C):   49.4 ml 27.79 ml/m RA Volume:   34.50 ml  19.41 ml/m LA Vol (A4C):   58.4 ml 32.85 ml/m LA Biplane Vol: 57.4 ml 32.29 ml/m  AORTIC VALVE AV Area (Vmax):    2.19 cm AV Area (Vmean):   2.40 cm AV Area (VTI):     2.38 cm AV Vmax:           205.00 cm/s AV Vmean:          138.000 cm/s AV VTI:            0.444 m AV Peak Grad:      16.8 mmHg AV Mean Grad:      9.0 mmHg LVOT Vmax:         118.00 cm/s LVOT Vmean:        87.100 cm/s LVOT VTI:          0.278 m LVOT/AV VTI ratio: 0.63  AORTA Ao Root diam: 2.80 cm MITRAL VALVE               TRICUSPID VALVE MV Area (PHT): 2.36 cm    TR Peak grad:   26.4 mmHg MV Decel Time: 321 msec    TR Vmax:        257.00 cm/s MV E velocity: 83.70 cm/s MV A velocity: 90.30 cm/s  SHUNTS MV E/A ratio:  0.93        Systemic VTI:  0.28 m                            Systemic Diam: 2.20 cm Carlyle Dolly MD Electronically signed by Carlyle Dolly MD Signature Date/Time: 11/18/2020/10:31:56 AM    Final     Micro Results   Recent Results (from the past 240 hour(s))  Resp Panel by RT-PCR (Flu A&B, Covid) Nasopharyngeal Swab     Status: None   Collection Time: 11/15/20  2:14 PM   Specimen: Nasopharyngeal Swab; Nasopharyngeal(NP) swabs in vial transport medium  Result Value Ref Range Status   SARS Coronavirus 2 by RT PCR NEGATIVE NEGATIVE Final    Comment: (NOTE) SARS-CoV-2 target nucleic acids are NOT DETECTED.  The SARS-CoV-2 RNA is generally detectable in upper respiratory specimens during the acute phase of infection. The  lowest concentration of SARS-CoV-2 viral copies this assay can detect is 138 copies/mL. A negative result does not preclude SARS-Cov-2 infection and should not be used as the sole basis for treatment or other patient management decisions. A negative result may occur with  improper specimen collection/handling, submission of specimen other than nasopharyngeal swab, presence of viral mutation(s) within the areas targeted by this assay, and inadequate number of viral copies(<138 copies/mL). A negative result must be combined with clinical observations, patient history, and epidemiological information. The expected result is Negative.  Fact Sheet for Patients:  EntrepreneurPulse.com.au  Fact Sheet for Healthcare Providers:  IncredibleEmployment.be  This test is no t yet approved or cleared by the [place] FDA and  has been authorized for detection and/or diagnosis of SARS-CoV-2 by FDA under an Emergency Use Authorization (EUA). This EUA will remain  in effect (meaning this test can be used) for the duration of the COVID-19 declaration under Section 564(b)(1) of the Act, 21 U.S.C.section 360bbb-3(b)(1), unless the authorization is terminated  or revoked sooner.  Influenza A by PCR NEGATIVE NEGATIVE Final   Influenza B by PCR NEGATIVE NEGATIVE Final    Comment: (NOTE) The Xpert Xpress SARS-CoV-2/FLU/RSV plus assay is intended as an aid in the diagnosis of influenza from Nasopharyngeal swab specimens and should not be used as a sole basis for treatment. Nasal washings and aspirates are unacceptable for Xpert Xpress SARS-CoV-2/FLU/RSV testing.  Fact Sheet for Patients: EntrepreneurPulse.com.au  Fact Sheet for Healthcare Providers: IncredibleEmployment.be  This test is not yet approved or cleared by the [place] FDA and has been authorized for detection and/or diagnosis of SARS-CoV-2 by FDA under  an Emergency Use Authorization (EUA). This EUA will remain in effect (meaning this test can be used) for the duration of the COVID-19 declaration under Section 564(b)(1) of the Act, 21 U.S.C. section 360bbb-3(b)(1), unless the authorization is terminated or revoked.  Performed at [facility], [address] [place], [place] [address]   Urine culture     Status: Abnormal   Collection Time: 11/15/20  2:14 PM   Specimen: In/Out Cath Urine  Result Value Ref Range Status   Specimen Description   Final    IN/OUT CATH URINE Performed at [facility], [address] [place], [place] [address]    Special Requests   Final    NONE Performed at [facility], [address] [place], [place] [address]    Culture 10,000 COLONIES/mL KLEBSIELLA OXYTOCA (A)  Final   Report Status 11/18/2020 FINAL  Final   Organism ID, Bacteria KLEBSIELLA OXYTOCA (A)  Final      Susceptibility   Klebsiella oxytoca - MIC*    AMPICILLIN RESISTANT Resistant     CEFAZOLIN 16 SENSITIVE Sensitive     CEFEPIME <=0.12 SENSITIVE Sensitive     CEFTRIAXONE <=0.25 SENSITIVE Sensitive     CIPROFLOXACIN <=0.25 SENSITIVE Sensitive     GENTAMICIN <=1 SENSITIVE Sensitive     IMIPENEM <=0.25 SENSITIVE Sensitive     NITROFURANTOIN <=16 SENSITIVE Sensitive     TRIMETH/SULFA <=20 SENSITIVE Sensitive     AMPICILLIN/SULBACTAM 4 SENSITIVE Sensitive     PIP/TAZO <=4 SENSITIVE Sensitive     * 10,000 COLONIES/mL KLEBSIELLA OXYTOCA  Blood Culture (routine x 2)     Status: None (Preliminary result)   Collection Time: 11/15/20  2:37 PM   Specimen: BLOOD  Result Value Ref Range Status   Specimen Description BLOOD BLOOD RIGHT FOREARM  Final   Special Requests   Final    Blood Culture adequate volume BOTTLES DRAWN AEROBIC AND ANAEROBIC   Culture   Final    NO GROWTH 3 DAYS Performed at [facility], [address], [place], [place] [address]    Report Status PENDING  Incomplete  Blood Culture (routine x 2)     Status: None (Preliminary  result)   Collection Time: 11/15/20  2:37 PM   Specimen: BLOOD  Result Value Ref Range Status   Specimen Description BLOOD BLOOD LEFT ARM  Final   Special Requests   Final    Blood Culture adequate volume BOTTLES DRAWN AEROBIC AND ANAEROBIC   Culture   Final    NO GROWTH 3 DAYS Performed at [facility], [address] [place], [place] [address]    Report Status PENDING  Incomplete       Today   Subjective    Brandey Vandalen today has no new complaints  No fever  Or chills   No Nausea, Vomiting or Diarrhea  Patient has been seen and examined prior to discharge   Objective   Blood pressure (!) 144/61, pulse (!) 57, temperature 97.9 F (36.6 C), temperature source Oral, resp. rate 16, height 5\' 7"  (1.702 m), weight 67 kg, SpO2 99 %.   Intake/Output Summary (Last 24 hours) at 11/18/2020 1214 Last data filed at 11/17/2020 1800 Gross per 24 hour  Intake 420 ml  Output 200 ml  Net 220 ml    Exam Gen:- Awake Alert, no acute distress speaking in complete sentences HEENT:- [place].AT, No sclera icterus Neck-Supple Neck,No JVD,.  Lungs-  CTAB , good air movement bilaterally  CV- S1, S2 normal, regular, pacemaker in situ Abd-  +ve B.Sounds, Abd Soft, No tenderness, no CVA area tenderness Extremity/Skin:- No  edema,   good pulses Psych-affect is appropriate, oriented x3 Neuro-generalized weakness, no new focal deficits, no tremors    Data Review   CBC w Diff:  Lab Results  Component Value Date   WBC 5.7 11/18/2020   HGB 9.2 (L) 11/18/2020   HCT 28.4 (L) 11/18/2020   PLT 285 11/18/2020   LYMPHOPCT 37 11/15/2020   MONOPCT 14 11/15/2020   EOSPCT 3 11/15/2020   BASOPCT 1 11/15/2020    CMP:  Lab Results  Component Value Date   NA 133 (L) 11/18/2020   K 4.7 11/18/2020   CL 104 11/18/2020   CO2 25 11/18/2020   BUN 25 (H) 11/18/2020   CREATININE 1.05 (H) 11/18/2020   CREATININE 1.23 (H) 03/24/2019   PROT 6.8 11/15/2020   ALBUMIN 3.5 11/15/2020    BILITOT 0.4 11/15/2020   ALKPHOS 58 11/15/2020   AST 22 11/15/2020   ALT 13 11/15/2020  .   Total Discharge time is about 33 minutes  Roxan Hockey M.D on 11/18/2020 at 12:14 PM  Go to [email or web address] -  for contact info  [facility] - Office  [phone]

## 2020-11-20 LAB — CULTURE, BLOOD (ROUTINE X 2)
Culture: NO GROWTH
Culture: NO GROWTH
Special Requests: ADEQUATE
Special Requests: ADEQUATE

## 2020-11-22 ENCOUNTER — Ambulatory Visit (HOSPITAL_COMMUNITY): Payer: Medicare Other | Admitting: Hematology

## 2020-11-22 ENCOUNTER — Encounter: Payer: Self-pay | Admitting: Adult Health

## 2020-11-22 ENCOUNTER — Non-Acute Institutional Stay (SKILLED_NURSING_FACILITY): Payer: Medicare Other | Admitting: Adult Health

## 2020-11-22 ENCOUNTER — Encounter (HOSPITAL_COMMUNITY)
Admission: RE | Admit: 2020-11-22 | Discharge: 2020-11-22 | Disposition: A | Discharge status: Home / Self Care | Payer: Medicare Other | Source: Skilled Nursing Facility | Attending: Adult Health | Admitting: Adult Health

## 2020-11-22 DIAGNOSIS — N1831 Chronic kidney disease, stage 3a: Secondary | ICD-10-CM

## 2020-11-22 DIAGNOSIS — L03116 Cellulitis of left lower limb: Secondary | ICD-10-CM | POA: Diagnosis not present

## 2020-11-22 DIAGNOSIS — N390 Urinary tract infection, site not specified: Secondary | ICD-10-CM | POA: Diagnosis not present

## 2020-11-22 DIAGNOSIS — U071 COVID-19: Secondary | ICD-10-CM | POA: Insufficient documentation

## 2020-11-22 DIAGNOSIS — J410 Simple chronic bronchitis: Secondary | ICD-10-CM

## 2020-11-22 DIAGNOSIS — R600 Localized edema: Secondary | ICD-10-CM

## 2020-11-22 DIAGNOSIS — F339 Major depressive disorder, recurrent, unspecified: Secondary | ICD-10-CM

## 2020-11-22 DIAGNOSIS — E1122 Type 2 diabetes mellitus with diabetic chronic kidney disease: Secondary | ICD-10-CM

## 2020-11-22 DIAGNOSIS — N183 Chronic kidney disease, stage 3 unspecified: Secondary | ICD-10-CM

## 2020-11-22 DIAGNOSIS — F0150 Vascular dementia without behavioral disturbance: Secondary | ICD-10-CM

## 2020-11-22 DIAGNOSIS — I700 Atherosclerosis of aorta: Secondary | ICD-10-CM

## 2020-11-22 DIAGNOSIS — C3411 Malignant neoplasm of upper lobe, right bronchus or lung: Secondary | ICD-10-CM

## 2020-11-22 DIAGNOSIS — L03115 Cellulitis of right lower limb: Secondary | ICD-10-CM

## 2020-11-22 DIAGNOSIS — K219 Gastro-esophageal reflux disease without esophagitis: Secondary | ICD-10-CM

## 2020-11-22 DIAGNOSIS — D509 Iron deficiency anemia, unspecified: Secondary | ICD-10-CM

## 2020-11-22 DIAGNOSIS — I10 Essential (primary) hypertension: Secondary | ICD-10-CM | POA: Insufficient documentation

## 2020-11-22 DIAGNOSIS — R296 Repeated falls: Secondary | ICD-10-CM

## 2020-11-22 DIAGNOSIS — I442 Atrioventricular block, complete: Secondary | ICD-10-CM

## 2020-11-22 DIAGNOSIS — F17200 Nicotine dependence, unspecified, uncomplicated: Secondary | ICD-10-CM | POA: Diagnosis not present

## 2020-11-22 DIAGNOSIS — I129 Hypertensive chronic kidney disease with stage 1 through stage 4 chronic kidney disease, or unspecified chronic kidney disease: Secondary | ICD-10-CM

## 2020-11-22 DIAGNOSIS — E1169 Type 2 diabetes mellitus with other specified complication: Secondary | ICD-10-CM

## 2020-11-22 DIAGNOSIS — E785 Hyperlipidemia, unspecified: Secondary | ICD-10-CM

## 2020-11-22 LAB — CBC
HCT: 24.6 % — ABNORMAL LOW (ref 36.0–46.0)
Hemoglobin: 8.1 g/dL — ABNORMAL LOW (ref 12.0–15.0)
MCH: 30.1 pg (ref 26.0–34.0)
MCHC: 32.9 g/dL (ref 30.0–36.0)
MCV: 91.4 fL (ref 80.0–100.0)
Platelets: 250 10*3/uL (ref 150–400)
RBC: 2.69 MIL/uL — ABNORMAL LOW (ref 3.87–5.11)
RDW: 13.8 % (ref 11.5–15.5)
WBC: 6.7 10*3/uL (ref 4.0–10.5)
nRBC: 0 % (ref 0.0–0.2)

## 2020-11-22 LAB — BASIC METABOLIC PANEL
Anion gap: 7 (ref 5–15)
BUN: 32 mg/dL — ABNORMAL HIGH (ref 8–23)
CO2: 25 mmol/L (ref 22–32)
Calcium: 8.7 mg/dL — ABNORMAL LOW (ref 8.9–10.3)
Chloride: 102 mmol/L (ref 98–111)
Creatinine, Ser: 1.13 mg/dL — ABNORMAL HIGH (ref 0.44–1.00)
GFR, Estimated: 49 mL/min — ABNORMAL LOW (ref >60)
Glucose, Bld: 79 mg/dL (ref 70–99)
Potassium: 4.1 mmol/L (ref 3.5–5.1)
Sodium: 134 mmol/L — ABNORMAL LOW (ref 135–145)

## 2020-11-22 NOTE — Progress Notes (Signed)
Location:  [facility] Room Number: 123-P Place of Service:  SNF (31)   CODE STATUS: Full  Allergies  Allergen Reactions   Ace Inhibitors Hives, Shortness Of Breath and Swelling   Aspirin Other (See Comments)    Blood in stool and nose bleed   Codeine Swelling   Pineapple Shortness Of Breath and Swelling   Plasticized Base [Plastibase] Hives and Swelling    No plastics   Ultram [Tramadol] Swelling   Adhesive [Tape] Swelling and Rash   Naprosyn [Naproxen] Swelling   Biaxin [Clarithromycin] Nausea And Vomiting and Other (See Comments)    Dizziness.   Clindamycin/Lincomycin Other (See Comments)    Burning sensation throat, abdomen   Linzess [Linaclotide]     DIARRHEA, FECAL INCONTINENCE   Metronidazole     NAUSEA AND WEAKNESS   Nsaids    Penicillins     Lip swelling, Hives Has patient had a PCN reaction causing immediate rash, facial/tongue/throat swelling, SOB or lightheadedness with hypotension:YES Has patient had a PCN reaction causing severe rash involving mucus membranes or skin necrosis: NO Has patient had a PCN reaction that required hospitalization NO Has patient had a PCN reaction occurring within the last 10 years: NO If all of the above answers are "NO", then may proceed with Cephalosporin use.   Varenicline Tartrate Swelling   Xarelto [Rivaroxaban]     Bleeding    Chief Complaint  Patient presents with   Hospitalization Follow-up    HPI:  She is a 80 year old woman who has been hospitalized from 11-15-20 through 11-18-20. Her medical history includes: depression; dementia; GERD; smoker. She presented to the ED with increased lower extremity; weakness and falls over the past week. On 11-12-20 she was diagnosed with bilateral lower extremity edea with left worse than right. There was improvement in her legs upon admission to the hospital. She was admitted with Orma Flaming UTI (grew 10,000 colonies). Was started on rocephin and will need to  complete po keflex. Her bilateral lower extremity cellulitis was treated with dalavancin on 11-12-20. She was hypotensive which resolved with IV hydration. She had had falls for the past 6 months at home.  She is here for short term rehab with her goal to return back home. There are no reports of pain present. There are no reports changes in appetite; no chest pain no shortness of breath. She will continue to be followed for her chronic illnesses including: CHB (complete heart block) I  . Hypertension associated with chronic kidney disease due to type 2 diabetes mellitus: I   Aortic atherosclerosis  Gastroesophageal reflux disease without esophagitis   Past Medical History:  Diagnosis Date   Arterial fibromuscular dysplasia (HCC)    ASCVD (arteriosclerotic cardiovascular disease)    a. cath in 4/04,-80%LAD; 70% RCA, -> DESx2; residual 60% distal RCA; nl EF  b. Nuc 02/2016 aborted due to bradycardia. Will need to be rescheduled    Carotid artery occlusion    Cerebrovascular disease    CHB (complete heart block) ([place])    a. s/p PPM on 03/24/16 with a St. Jude (serial number [id number]) pacemaker   Chronic kidney disease (CKD), stage III (moderate) (HCC)    Clostridium difficile colitis 03/2013   a. 03/2013   COPD (chronic obstructive pulmonary disease) ([place])    Coronary disease    Diverticulosis    GERD (gastroesophageal reflux disease)    Gout    H pylori ulcer    Hyperlipidemia    chose to  stop statin, takes Zetia   Hypertension    not currently on medication   IBS (irritable bowel syndrome)    PVD (peripheral vascular disease) (HCC)    a. moderate to severely decreased ABI's in 8/08, sig. aortic inflow disease b. repeat ABI's in 2011 improved- mild disease on the left and moderate to severe disease on the right   S/P placement of cardiac pacemaker    a. 03/24/16: St. Jude (serial number [id number]) pacemaker   Symptomatic anemia 08/15/2016   TIA (transient ischemic attack) 2012   Tobacco  abuse    a. 50 pack years continuing at 1/2 pack per day   Tobacco use disorder 10/15/2015    Past Surgical History:  Procedure Laterality Date   ABDOMINAL HYSTERECTOMY     carpel tunnel release     bilateral   CHOLECYSTECTOMY     COLONOSCOPY  2009   COLONOSCOPY N/A 08/04/2016   incomplete due to prep.    COLONOSCOPY N/A 08/05/2016   Dr. Oneida Alar: moderately redundant rectosigmoid and sigmoid, normal TI, single large-mouthed diverticulum in hepatic flexure, external hemorrhoids, path negative for microscopic colitis    ENTEROSCOPY N/A 08/18/2016   Procedure: ENTEROSCOPY;  Surgeon: Daneil Dolin, MD;  Location: [facility];  Service: Endoscopy;  Laterality: N/A;  Pediatric colonoscope   EP IMPLANTABLE DEVICE N/A 03/24/2016   Procedure: Pacemaker Implant;  Surgeon: [name], MD;  Location: [facility];  Service: Cardiovascular;  Laterality: N/A;   ESOPHAGOGASTRODUODENOSCOPY N/A 08/04/2016   Dr. Oneida Alar: non-critical Schatzki's ring, small hiatal hernia, +H.pylori gastritis on path.    GIVENS CAPSULE STUDY N/A 08/16/2016   Procedure: GIVENS CAPSULE STUDY;  Surgeon: Daneil Dolin, MD;  Location: [facility];  Service: Endoscopy;  Laterality: N/A;   TOTAL ABDOMINAL HYSTERECTOMY W/ BILATERAL SALPINGOOPHORECTOMY  1979    Social History   Socioeconomic History   Marital status: Widowed    Spouse name: Not on file   Number of children: Not on file   Years of education: Not on file   Highest education level: Not on file  Occupational History   Not on file  Tobacco Use   Smoking status: Every Day    Packs/day: 1.00    Years: 50.00    Pack years: 50.00    Types: Cigarettes    Start date: 02/02/1966   Smokeless tobacco: Never   Tobacco comments:    1/2 pack daily  Vaping Use   Vaping Use: Never used  Substance and Sexual Activity   Alcohol use: No    Alcohol/week: 0.0 standard drinks    Comment: H/O of 6 pack per day quiting in 2003   Drug use: No   Sexual activity: Not  Currently  Other Topics Concern   Not on file  Social History Narrative   Not on file   Social Determinants of Health   Financial Resource Strain: Not on file  Food Insecurity: Not on file  Transportation Needs: Not on file  Physical Activity: Not on file  Stress: Not on file  Social Connections: Not on file  Intimate Partner Violence: Not on file   Family History  Problem Relation Age of Onset   Liver disease Mother 51   Hypertension Mother    Cerebral aneurysm Father    Aneurysm Father 40   Cancer Sister    Liver disease Brother    Liver disease Brother    Liver disease Brother    Diabetes type II Brother  Alcohol abuse Brother    Colon cancer Neg Hx       VITAL SIGNS BP (!) 121/54   Pulse 70   Temp 97.7 F (36.5 C)   Resp 19   Ht 5\' 7"  (1.702 m)   Wt 146 lb (66.2 kg)   SpO2 97%   BMI 22.87 kg/m   Outpatient Encounter Medications as of 11/22/2020  Medication Sig   acetaminophen (TYLENOL) 325 MG tablet Take 2 tablets (650 mg total) by mouth every 6 (six) hours as needed for mild pain (or Fever >/= 101).   albuterol (PROVENTIL HFA;VENTOLIN HFA) 108 (90 Base) MCG/ACT inhaler Inhale 2 puffs into the lungs every 4 (four) hours as needed for wheezing or shortness of breath.   calcium citrate-vitamin D (CITRACAL+D) 315-200 MG-UNIT per tablet Take 1 tablet by mouth daily.   cephALEXin (KEFLEX) 500 MG capsule Take 1 capsule (500 mg total) by mouth 3 (three) times daily for 5 days.   clotrimazole (LOTRIMIN) 1 % cream Apply 1 application topically 2 (two) times daily. Beneath stomach   Cyanocobalamin (VITAMIN B 12 PO) Take 1 tablet by mouth daily.    donepezil (ARICEPT) 5 MG tablet Take 1 tablet (5 mg total) by mouth at bedtime.   escitalopram (LEXAPRO) 10 MG tablet Take 1 tablet (10 mg total) by mouth daily.   esomeprazole (NEXIUM) 40 MG capsule TAKE 1 CAPSULE BY MOUTH DAILY BEFORE BREAKFAST.   ezetimibe (ZETIA) 10 MG tablet Take 1 tablet (10 mg total) by mouth daily.    ferrous sulfate 325 (65 FE) MG tablet Take 325 mg by mouth daily with breakfast.   furosemide (LASIX) 20 MG tablet Take 1 tablet (20 mg total) by mouth daily.   lidocaine (LIDODERM) 5 % Place 1 patch onto the skin daily. Remove & Discard patch within 12 hours or as directed by MD   Multiple Vitamin (MULTIVITAMIN WITH MINERALS) TABS tablet Take 1 tablet by mouth daily.   nicotine (NICODERM CQ - DOSED IN MG/24 HOURS) 14 mg/24hr patch Place 14 mg onto the skin daily.   NON FORMULARY Diet: NAS  allergic to pineapple   nystatin (MYCOSTATIN/NYSTOP) powder Apply 1 application topically 3 (three) times daily. Beneath breast and stomach   ondansetron (ZOFRAN) 4 MG tablet Take 1 tablet (4 mg total) by mouth every 6 (six) hours as needed for nausea.   No facility-administered encounter medications on file as of 11/22/2020.     SIGNIFICANT DIAGNOSTIC EXAMS  TODAY  11-15-20: chest x-ray: No acute disease.  Aortic Atherosclerosis  and Emphysema  11-18-20: 2-d echo:  Left ventricular ejection fraction, by estimation, is 65 to 70%. The  left ventricle has hyperdynamic function. The left ventricle has no  regional wall motion abnormalities. There is mild left ventricular  hypertrophy. Left ventricular diastolic  parameters are consistent with Grade I diastolic dysfunction (impaired  relaxation).   LABS REVIEWED:   11-15-20: wbc 5.9; hgb 8.3; hct 25.7; mcv 93.1 plt 281; glucose 82; bun 36; creat 1.46; k+ 4.5; na++ 135; ca 8.9; GFR 36; liver normal albumin 3.5 vit B 12: 693; folate 10.5; iron 34; tibc 186; ferritin 492 urine culture: 10,000. 11-18-20: wbc 5.7; hgb 9.2; hct 28.4; mcv 92.2 plt 285; glucose 76; bun 25; creat 1.05; k+ 4.7; na++ 133; ca 8.4 GFR 55; mag 1.9  Review of Systems  Constitutional:  Negative for malaise/fatigue.  Respiratory:  Negative for cough and shortness of breath.   Cardiovascular:  Negative for chest pain, palpitations and leg swelling.  Gastrointestinal:  Negative for  abdominal pain, constipation and heartburn.  Musculoskeletal:  Negative for back pain, joint pain and myalgias.  Skin: Negative.   Neurological:  Negative for dizziness.  Psychiatric/Behavioral:  The patient is not nervous/anxious.    Physical Exam Constitutional:      General: She is not in acute distress.    Appearance: She is well-developed. She is not diaphoretic.  Neck:     Thyroid: No thyromegaly.  Cardiovascular:     Rate and Rhythm: Normal rate and regular rhythm.     Pulses: Normal pulses.     Heart sounds: Normal heart sounds.  Pulmonary:     Effort: Pulmonary effort is normal. No respiratory distress.     Breath sounds: Normal breath sounds.  Abdominal:     General: Bowel sounds are normal. There is no distension.     Palpations: Abdomen is soft.     Tenderness: There is no abdominal tenderness.  Musculoskeletal:        General: Normal range of motion.     Cervical back: Neck supple.     Right lower leg: No edema.     Left lower leg: No edema.  Lymphadenopathy:     Cervical: No cervical adenopathy.  Skin:    General: Skin is warm and dry.  Neurological:     Mental Status: She is alert. Mental status is at baseline.  Psychiatric:        Mood and Affect: Mood normal.     ASSESSMENT/ PLAN:  TODAY  Acute lower UTI/bilateral lower extremity cellulitis; will complete keflex for total 5 more days and will monitor  2. Multiple falls: is stable will continue therapy as directed to improve upon balance strength gait adl training to improve upon her independence with her adls.   3. Tobacco use disorder: will continue nicotine patch 14 mg daily   4. Simple chronic bronchitis: is stable will continue albuterol 2 puffs every 4 hours as needed  5. Malignant neoplasm right upper lobe (2019)   6. CHB (complete heart block) is status post pace maker will monitor   7. Hypertension associated with chronic kidney disease due to type 2 diabetes mellitus: is stable b/p  121/54 will conitnu to monitor her status.   8. Aortic atherosclerosis (cxr 11-15-20) will monitor   9. Gastroesophageal reflux disease without esophagitis: is stable will continue nexium 40 mg daily   10. Stage 3 chronic kidney disease due to type 2 diabetes mellitus: is stable bun 25 creat 1.05 GFR 55  11. Type 2 diabetes mellitus with stage 3a chronic kidney disease without long term current use of insulin: is stable will monitor   12. Hyperlipidemia associated with type 2 diabetes mellitus: will continue zetia 10 mg daily   13. Vascular dementia without behavioral disturbance: is stable weight is 146 pounds will continue aricept 5 mg nightly   14. Major depression recurrent chronic: is stable will continue lexapro 10 mg daily   15. Unspecified iron deficiency anemia: is stable hgb 9.2 will monitor   16. Bilateral lower extremity edema is stable will continue lasix 20 mg daily   Time spent with patient 45 minutes: therapy; goals of care; medication reconciliation.    Ok Edwards NP [facility]  Contact [phone] Monday through Friday 8am- 5pm  After hours call [phone]

## 2020-11-23 ENCOUNTER — Ambulatory Visit: Payer: Medicare Other | Admitting: Internal Medicine

## 2020-11-23 ENCOUNTER — Non-Acute Institutional Stay (SKILLED_NURSING_FACILITY): Payer: Medicare Other | Admitting: Internal Medicine

## 2020-11-23 ENCOUNTER — Encounter: Payer: Self-pay | Admitting: Internal Medicine

## 2020-11-23 DIAGNOSIS — N1831 Chronic kidney disease, stage 3a: Secondary | ICD-10-CM

## 2020-11-23 DIAGNOSIS — R296 Repeated falls: Secondary | ICD-10-CM | POA: Diagnosis not present

## 2020-11-23 DIAGNOSIS — L03119 Cellulitis of unspecified part of limb: Secondary | ICD-10-CM

## 2020-11-23 NOTE — Assessment & Plan Note (Addendum)
Creatinine peaked at 1.53 but improved to 1.0 prior to discharge w GFR 49 CKD Stage 3a Medication List reviewed; no nephrotoxic agents identified.

## 2020-11-23 NOTE — Patient Instructions (Signed)
See assessment and plan under each diagnosis in the problem list and acutely for this visit 

## 2020-11-23 NOTE — Assessment & Plan Note (Addendum)
PT/OT at SNF. Prophylactic safety interventions @ home as per PT/OT.

## 2020-11-23 NOTE — Progress Notes (Signed)
NURSING HOME LOCATION: [facility] ROOM NUMBER:  [id number]  CODE STATUS:  Full Code  PCP:  no PCP  This is a comprehensive admission note to this SNFperformed on this date less than 30 days from date of admission. Included are preadmission medical/surgical history; reconciled medication list; family history; social history and comprehensive review of systems.  Corrections and additions to the records were documented. Comprehensive physical exam was also performed. Additionally a clinical summary was entered for each active diagnosis pertinent to this admission in the Problem List to enhance continuity of care.  HPI: Patient was hospitalized 6/13 - 11/18/2020, presenting to the ED with increasing lower extremity pain & weakness, with recurrent falls over the prior week.   She had been seen in the ED 6/10 for cellulitis present in the bilateral lower extremities, left greater than right with associated enlargement and drainage.  Venous Dopplers were negative for DVT.  She was given a dose of dalbavancin and discharged home. She returned to the ED 6/13 with increasing pain.  Her daughter actually admitted that the lower extremities appeared better than when seen 6/10.  Patient also described dysuria ,urinary incontinence and urgency. She had been having repeated falling; 1 episode was apparently associated with loss of consciousness. Temp was 99.1; hypotension was present with blood pressure of 86/51.  With IV fluids blood pressure improved to 135/65.  UA suggested UTI.  With hypotension and possible sepsis ,broad-spectrum antibiotics were initiated with IV cefepime and Levaquin. Cultures documented Klebsiella with transition to aztreonam based on multiple drug allergy history. She also received IV Rocephin.  She was transitioned to p.o. Keflex. Bilateral lower extremity cellulitis was clinically improved with the above antibiotic regimen. Course was complicated by AKI on CKD stage  IIIA.  Creatinine improved from 1.53 to 1.0. She exhibited chronic anemia associated with CKD; hemoglobin was stable at 9.2. Stage II sacral decubitus was present.  Wound care was initiated in the hospital. She has a diagnosis of chronic diastolic dysfunction congestive heart failure; 6/16 echo revealed EF of 65-70% with grade 1 diastolic dysfunction.  Once blood pressure was stabilized furosemide 20 mg daily was reinitiated. Because of at least 6 falls as per her daughter, PT recommended SNF placement for rehab.  Past medical and surgical history: Includes arterial fibromuscular dysplasia, atherosclerotic cardiovascular disease, carotid artery occlusion, history of complete heart block, CKD stage III, history of C. difficile colitis, COPD, GERD, history of gout, dyslipidemia, essential hypertension, IBS, PVD, history of tobacco abuse, and history of TIA. Surgeries and procedures include abdominal hysterectomy, carpal tunnel release, cholecystectomy, colonoscopies, EGD, and EP implantation.  Social history: Nondrinker; current smoker prior to admission with a 50-pack-year consumption history.  Family history: Noncontributory due to advanced age.   Review of systems: Clinical neurocognitive deficits made validity of responses.  When asked why she had been hospitalized she stated that she had "kept falling".  When asked why she stated "don't know".  She stated that she is fine and simply wants to go home.  Her only positive review of systems was "numbness in toes".  Constitutional: No fever, significant weight change  Eyes: No redness, discharge, pain, vision change ENT/mouth: No nasal congestion, purulent discharge, earache, change in hearing, sore throat  Cardiovascular: No chest pain, palpitations, paroxysmal nocturnal dyspnea, claudication, edema  Respiratory: No cough, sputum production, hemoptysis, DOE, significant snoring, apnea Gastrointestinal: No heartburn, dysphagia, abdominal pain,  nausea /vomiting, rectal bleeding, melena, change in bowels Genitourinary: No dysuria, hematuria, pyuria, incontinence,  nocturia Musculoskeletal: No joint stiffness, joint swelling, pain Dermatologic: No rash, pruritus, change in appearance of skin Neurologic: No dizziness, headache, syncope, seizures Psychiatric: No significant anxiety, depression, insomnia, anorexia Endocrine: No change in hair/skin/nails, excessive thirst, excessive hunger, excessive urination  Hematologic/lymphatic: No significant bruising, lymphadenopathy, abnormal bleeding Allergy/immunology: No itchy/watery eyes, significant sneezing, urticaria, angioedema  Physical exam:  Pertinent or positive findings: She appears her stated age and somewhat chronically ill.  There appears to be some malar asymmetry with loss of subcutaneous tissue on the left and decreased left nasolabial fold compared to the right.  She has an upper plate and lower partial.  Grade 8/5-6/3 systolic murmur is present with slight increase in S2.  She has minimal rales at the bases.  Breath sounds are overall decreased.  Her pulses are decreased.  She has keratoses over the shins, left greater than the right. Active cellulitis is not suggested.  General appearance: no acute distress, increased work of breathing is present.   Lymphatic: No lymphadenopathy about the head, neck, axilla. Eyes: No conjunctival inflammation or lid edema is present. There is no scleral icterus. Ears:  External ear exam shows no significant lesions or deformities.   Nose:  External nasal examination shows no deformity or inflammation. Nasal mucosa are pink and moist without lesions, exudates Oral exam: Lips and gums are healthy appearing.There is no oropharyngeal erythema or exudate. Neck:  No thyromegaly, masses, tenderness noted.    Heart:  Normal rate and regular rhythm. S1 normal without gallop, click, rub.  Lungs:  without wheezes, rhonchi,  rubs. Abdomen: Bowel sounds are  normal.  Abdomen is soft and nontender with no organomegaly, hernias, masses. GU: Deferred  Extremities:  No cyanosis, clubbing, edema. Neurologic exam:  Balance, Rhomberg, finger to nose testing could not be completed due to clinical state Skin: Warm & dry w/o tenting. No significant rash.  See clinical summary under each active problem in the Problem List with associated updated therapeutic plan

## 2020-11-26 ENCOUNTER — Encounter (HOSPITAL_COMMUNITY): Payer: Self-pay | Admitting: Hematology

## 2020-11-26 NOTE — Assessment & Plan Note (Addendum)
Wound Care Nurse monitored her @ SNF. Clinically cellulitis resolved as per exam.

## 2020-11-30 ENCOUNTER — Encounter: Payer: Self-pay | Admitting: Internal Medicine

## 2020-11-30 ENCOUNTER — Ambulatory Visit (INDEPENDENT_AMBULATORY_CARE_PROVIDER_SITE_OTHER): Payer: Medicare Other | Admitting: Internal Medicine

## 2020-11-30 ENCOUNTER — Other Ambulatory Visit: Payer: Self-pay

## 2020-11-30 VITALS — BP 147/76 | HR 59 | Temp 97.5°F

## 2020-11-30 DIAGNOSIS — L03115 Cellulitis of right lower limb: Secondary | ICD-10-CM | POA: Insufficient documentation

## 2020-11-30 DIAGNOSIS — N179 Acute kidney failure, unspecified: Secondary | ICD-10-CM

## 2020-11-30 DIAGNOSIS — I739 Peripheral vascular disease, unspecified: Secondary | ICD-10-CM | POA: Diagnosis not present

## 2020-11-30 DIAGNOSIS — N390 Urinary tract infection, site not specified: Secondary | ICD-10-CM | POA: Diagnosis not present

## 2020-11-30 DIAGNOSIS — I5030 Unspecified diastolic (congestive) heart failure: Secondary | ICD-10-CM

## 2020-11-30 DIAGNOSIS — E1140 Type 2 diabetes mellitus with diabetic neuropathy, unspecified: Secondary | ICD-10-CM

## 2020-11-30 DIAGNOSIS — L03116 Cellulitis of left lower limb: Secondary | ICD-10-CM | POA: Diagnosis not present

## 2020-11-30 NOTE — Progress Notes (Signed)
[facility]  Reason for Consult: Lower extremity cellulitis bilaterally  Referring Provider: Thamas Jaegers, MD   HPI:    Charlotte Henry is a 80 y.o. female with PMHx as below who presents to the clinic for bilateral lower extremity cellulitis.   Patient initially presented to the emergency department on 6/10 with complaint of leg swelling and pain x1 month with resultant drainage from her legs.  She had been seen by orthopedics as an outpatient and given antibiotics which she completed without any improvement.  She underwent lower extremity Dopplers at that time which showed no evidence of DVT in the lower extremities however noted peripheral arterial vascular disease and bilateral calf edema.  It was felt that the drainage from her legs was purulent and the left leg was more swollen than the right.  Her CBC and CMP were without significant deviation from prior.  She was given a dose of dalbavancin for "bilateral cellulitis" at that time and referred for follow-up.  Patient was brought back to the emergency department 6/13 with reported increasing pain in her legs, however, per H&P the lower extremities appeared better than 3 days prior.  She apparently endorsed pain with urination, urinary incontinence, and urgency.  In the emergency department she was found to have a temperature of 99.1 F and blood pressure was soft with a systolic 05/39 improved to 136/65 after IV fluids.  Her urinalysis was deemed to be suggestive of a UTI and urine cultures grew approximately 10,000 colonies of Klebsiella oxytoca.  Lower extremity x-rays were also obtained which showed nonspecific soft tissue swelling on the right and her left x-ray was unremarkable.  She was treated with aztreonam given multiple drug allergies before being transitioned to IV ceftriaxone and then p.o. Keflex at discharge.  It was felt that the cellulitis of her bilateral lower extremities had been fully treated  with dalbavancin and was healing well.  Patient was evaluated by physical therapy and recommended SNF rehab at discharge.  She was seen on 6/21 by Dr. Linna Darner at her SNF.  At that time she had no evidence of ongoing cellulitis and was felt to have resolved.  Patient's Medications  New Prescriptions   No medications on file  Previous Medications   ACETAMINOPHEN (TYLENOL) 325 MG TABLET    Take 2 tablets (650 mg total) by mouth every 6 (six) hours as needed for mild pain (or Fever >/= 101).   ALBUTEROL (PROVENTIL HFA;VENTOLIN HFA) 108 (90 BASE) MCG/ACT INHALER    Inhale 2 puffs into the lungs every 4 (four) hours as needed for wheezing or shortness of breath.   BALSAM PERU-CASTOR OIL (VENELEX) OINT    Apply topically.   CALCIUM CITRATE-VITAMIN D (CITRACAL+D) 315-200 MG-UNIT PER TABLET    Take 1 tablet by mouth daily.   CLOTRIMAZOLE (LOTRIMIN) 1 % CREAM    Apply 1 application topically 2 (two) times daily. Beneath stomach   CYANOCOBALAMIN (VITAMIN B 12 PO)    Take 1 tablet by mouth daily.    DONEPEZIL (ARICEPT) 5 MG TABLET    Take 1 tablet (5 mg total) by mouth at bedtime.   ESCITALOPRAM (LEXAPRO) 10 MG TABLET    Take 1 tablet (10 mg total) by mouth daily.   EZETIMIBE (ZETIA) 10 MG TABLET    Take 1 tablet (10 mg total) by mouth daily.   FERROUS SULFATE 325 (65 FE) MG TABLET    Take 325 mg by mouth daily with breakfast.  FUROSEMIDE (LASIX) 20 MG TABLET    Take 1 tablet (20 mg total) by mouth daily.   LIDOCAINE 4 % PTCH    Apply topically in the morning and at bedtime. apply to skin in AM and remove in PM   MULTIPLE VITAMIN (MULTIVITAMIN WITH MINERALS) TABS TABLET    Take 1 tablet by mouth daily.   NICOTINE (NICODERM CQ - DOSED IN MG/24 HOURS) 14 MG/24HR PATCH    Place 14 mg onto the skin daily.   NON FORMULARY    Diet: NAS  allergic to pineapple   NYSTATIN (MYCOSTATIN/NYSTOP) POWDER    Apply 1 application topically 3 (three) times daily. Beneath breast and stomach   OMEPRAZOLE (PRILOSEC) 20 MG  CAPSULE    Take 20 mg by mouth daily.   ONDANSETRON (ZOFRAN) 4 MG TABLET    Take 1 tablet (4 mg total) by mouth every 6 (six) hours as needed for nausea.  Modified Medications   No medications on file  Discontinued Medications   No medications on file      Past Medical History:  Diagnosis Date   Arterial fibromuscular dysplasia (HCC)    ASCVD (arteriosclerotic cardiovascular disease)    a. cath in 4/04,-80%LAD; 70% RCA, -> DESx2; residual 60% distal RCA; nl EF  b. Nuc 02/2016 aborted due to bradycardia. Will need to be rescheduled    Carotid artery occlusion    Cerebrovascular disease    CHB (complete heart block) ([place])    a. s/p PPM on 03/24/16 with a [employer] Jude (serial number [id number]) pacemaker   Chronic kidney disease (CKD), stage III (moderate) (HCC)    Clostridium difficile colitis 03/2013   a. 03/2013   COPD (chronic obstructive pulmonary disease) ([place])    Coronary disease    Diverticulosis    GERD (gastroesophageal reflux disease)    Gout    H pylori ulcer    Hyperlipidemia    chose to stop statin, takes Zetia   Hypertension    not currently on medication   IBS (irritable bowel syndrome)    PVD (peripheral vascular disease) (HCC)    a. moderate to severely decreased ABI's in 8/08, sig. aortic inflow disease b. repeat ABI's in 2011 improved- mild disease on the left and moderate to severe disease on the right   S/P placement of cardiac pacemaker    a. 03/24/16: St[employer] Jude (serial number [id number]) pacemaker   Symptomatic anemia 08/15/2016   TIA (transient ischemic attack) 2012   Tobacco abuse    a. 50 pack years continuing at 1/2 pack per day   Tobacco use disorder 10/15/2015    Social History   Tobacco Use   Smoking status: Every Day    Packs/day: 1.00    Years: 50.00    Pack years: 50.00    Types: Cigarettes    Start date: 02/02/1966   Smokeless tobacco: Never   Tobacco comments:    1/2 pack daily  Vaping Use   Vaping Use: Never used  Substance Use Topics    Alcohol use: No    Alcohol/week: 0.0 standard drinks    Comment: H/O of 6 pack per day quiting in 2003   Drug use: No    Family History  Problem Relation Age of Onset   Liver disease Mother 86   Hypertension Mother    Cerebral aneurysm Father    Aneurysm Father 90   Cancer Sister    Liver disease Brother    Liver disease Brother    Liver  disease Brother    Diabetes type II Brother    Alcohol abuse Brother    Colon cancer Neg Hx     Allergies  Allergen Reactions   Ace Inhibitors Hives, Shortness Of Breath and Swelling   Aspirin Other (See Comments)    Blood in stool and nose bleed   Codeine Swelling   Pineapple Shortness Of Breath and Swelling   Plasticized Base [Plastibase] Hives and Swelling    No plastics   Ultram [Tramadol] Swelling   Adhesive [Tape] Swelling and Rash   Naprosyn [Naproxen] Swelling   Biaxin [Clarithromycin] Nausea And Vomiting and Other (See Comments)    Dizziness.   Clindamycin/Lincomycin Other (See Comments)    Burning sensation throat, abdomen   Linzess [Linaclotide]     DIARRHEA, FECAL INCONTINENCE   Metronidazole     NAUSEA AND WEAKNESS   Nsaids    Penicillins     Lip swelling, Hives Has patient had a PCN reaction causing immediate rash, facial/tongue/throat swelling, SOB or lightheadedness with hypotension:YES Has patient had a PCN reaction causing severe rash involving mucus membranes or skin necrosis: NO Has patient had a PCN reaction that required hospitalization NO Has patient had a PCN reaction occurring within the last 10 years: NO If all of the above answers are "NO", then may proceed with Cephalosporin use.   Varenicline Tartrate Swelling   Xarelto [Rivaroxaban]     Bleeding    Review of Systems  Constitutional:  Negative for chills and fever.  Respiratory: Negative.    Cardiovascular:  Positive for claudication and leg swelling.  Gastrointestinal: Negative.   Genitourinary: Negative.      OBJECTIVE:    Vitals:    11/30/20 1354  BP: (!) 147/76  Pulse: (!) 59  Temp: (!) 97.5 F (36.4 C)  TempSrc: Oral     There is no height or weight on file to calculate BMI.  Physical Exam Constitutional:      General: She is not in acute distress.    Appearance: Normal appearance.  HENT:     Head: Normocephalic and atraumatic.  Pulmonary:     Effort: Pulmonary effort is normal. No respiratory distress.  Musculoskeletal:     Right lower leg: Edema present.     Left lower leg: Edema present.  Skin:    General: Skin is warm and dry.     Comments: No evidence of ongoing cellulitis. Mild lower extremity edema bilaterally.  Left leg with healed wounds and no drainage. Skin changes c/w PAD.  Neurological:     General: No focal deficit present.     Mental Status: She is alert and oriented to person, place, and time.     Labs and Microbiology:  CBC Latest Ref Rng & Units 11/22/2020 11/18/2020 11/17/2020  WBC 4.0 - 10.5 K/uL 6.7 5.7 5.6  Hemoglobin 12.0 - 15.0 g/dL 8.1(L) 9.2(L) 8.2(L)  Hematocrit 36.0 - 46.0 % 24.6(L) 28.4(L) 24.7(L)  Platelets 150 - 400 K/uL 250 285 259   CMP Latest Ref Rng & Units 11/22/2020 11/18/2020 11/17/2020  Glucose 70 - 99 mg/dL 79 76 79  BUN 8 - 23 mg/dL 32(H) 25(H) 29(H)  Creatinine 0.44 - 1.00 mg/dL 1.13(H) 1.05(H) 1.17(H)  Sodium 135 - 145 mmol/L 134(L) 133(L) 134(L)  Potassium 3.5 - 5.1 mmol/L 4.1 4.7 4.9  Chloride 98 - 111 mmol/L 102 104 105  CO2 22 - 32 mmol/L 25 25 24   Calcium 8.9 - 10.3 mg/dL 8.7(L) 8.4(L) 8.4(L)  Total Protein 6.5 - 8.1  g/dL - - -  Total Bilirubin 0.3 - 1.2 mg/dL - - -  Alkaline Phos 38 - 126 U/L - - -  AST 15 - 41 U/L - - -  ALT 0 - 44 U/L - - -     No results found for this or any previous visit (from the past 240 hour(s)).  Imaging: IMPRESSION: 1. No evidence of deep venous thrombosis in either lower extremity. 2. Incidental note of peripheral arterial vascular disease and bilateral calf edema.   ASSESSMENT & PLAN:    1. Acute lower  UTI Has been adequately treated.  2. Type 2 diabetes mellitus with diabetic neuropathy, without long-term current use of insulin (HCC) A1c in July 2021 was 5.7.  3. Peripheral vascular disease ([place]) Previous ABI have indicated severe arterial occlusive disease likely contributing to her symptoms.  She was previously supposed to undergo angiography in August 2017 but this was delayed and does not appear to have been done.  4. Bilateral cellulitis of lower leg Unclear if she had true cellulitis or was more related to her PAD and lower extremity edema since this was bilateral.  Nonetheless has been adequately treated with dalbavancin and she has improved without current findings to suggest infection.  Underlying issues to prevent recurrence should be addressed such as controlling the fluid in her legs, revascularization as needed, and keeping her legs elevated.   5. AKI (acute kidney injury) ([place]) Admitted earlier this month with creatinine 1.53 which improved to 1.0 at discharge.  6. Heart failure with preserved ejection fraction, unspecified HF chronicity ([place]) Echo done 11/18/20 with EF 65-70%, G1DD.  Appears euvolemic and medication list includes Lasix 20mg  daily.   [facility] 11/30/2020, 2:13 PM

## 2020-11-30 NOTE — Patient Instructions (Signed)
Thank you for coming to see me today. It was a pleasure seeing you.  To Do: Monitor off antibiotics Follow up as needed  If you have any questions or concerns, please do not hesitate to call the office at [phone].  Take Care,   Jule Ser

## 2020-12-02 ENCOUNTER — Non-Acute Institutional Stay (SKILLED_NURSING_FACILITY): Payer: Medicare Other | Admitting: Adult Health

## 2020-12-02 ENCOUNTER — Encounter (HOSPITAL_COMMUNITY)
Admission: RE | Admit: 2020-12-02 | Discharge: 2020-12-02 | Disposition: A | Discharge status: Home / Self Care | Payer: Medicare Other | Source: Skilled Nursing Facility | Attending: Internal Medicine | Admitting: Internal Medicine

## 2020-12-02 ENCOUNTER — Other Ambulatory Visit: Payer: Self-pay | Admitting: Adult Health

## 2020-12-02 ENCOUNTER — Encounter: Payer: Self-pay | Admitting: Adult Health

## 2020-12-02 DIAGNOSIS — F0150 Vascular dementia without behavioral disturbance: Secondary | ICD-10-CM | POA: Insufficient documentation

## 2020-12-02 DIAGNOSIS — I10 Essential (primary) hypertension: Secondary | ICD-10-CM | POA: Diagnosis not present

## 2020-12-02 DIAGNOSIS — E1122 Type 2 diabetes mellitus with diabetic chronic kidney disease: Secondary | ICD-10-CM | POA: Insufficient documentation

## 2020-12-02 DIAGNOSIS — N1831 Chronic kidney disease, stage 3a: Secondary | ICD-10-CM | POA: Diagnosis not present

## 2020-12-02 DIAGNOSIS — I700 Atherosclerosis of aorta: Secondary | ICD-10-CM | POA: Insufficient documentation

## 2020-12-02 DIAGNOSIS — F339 Major depressive disorder, recurrent, unspecified: Secondary | ICD-10-CM | POA: Insufficient documentation

## 2020-12-02 DIAGNOSIS — E440 Moderate protein-calorie malnutrition: Secondary | ICD-10-CM

## 2020-12-02 DIAGNOSIS — E785 Hyperlipidemia, unspecified: Secondary | ICD-10-CM | POA: Insufficient documentation

## 2020-12-02 DIAGNOSIS — N183 Chronic kidney disease, stage 3 unspecified: Secondary | ICD-10-CM

## 2020-12-02 LAB — SARS CORONAVIRUS 2 BY RT PCR (HOSPITAL ORDER, PERFORMED IN ~~LOC~~ HOSPITAL LAB): SARS Coronavirus 2: NEGATIVE

## 2020-12-02 MED ORDER — FUROSEMIDE 20 MG PO TABS: 20.0000 mg | ORAL_TABLET | Freq: Every day | ORAL | 0 refills | Status: DC

## 2020-12-02 MED ORDER — FERROUS SULFATE 325 (65 FE) MG PO TABS: 325.0000 mg | ORAL_TABLET | Freq: Every day | ORAL | 0 refills | Status: AC

## 2020-12-02 MED ORDER — ALBUTEROL SULFATE HFA 108 (90 BASE) MCG/ACT IN AERS: 2.0000 | INHALATION_SPRAY | RESPIRATORY_TRACT | 0 refills | Status: AC | PRN

## 2020-12-02 MED ORDER — CLOTRIMAZOLE 1 % EX CREA: 1.0000 "application " | TOPICAL_CREAM | Freq: Two times a day (BID) | CUTANEOUS | 0 refills | Status: DC

## 2020-12-02 MED ORDER — EZETIMIBE 10 MG PO TABS: 10.0000 mg | ORAL_TABLET | Freq: Every day | ORAL | 0 refills | Status: AC

## 2020-12-02 MED ORDER — ESCITALOPRAM OXALATE 10 MG PO TABS: 10.0000 mg | ORAL_TABLET | Freq: Every day | ORAL | 0 refills | Status: AC

## 2020-12-02 MED ORDER — DONEPEZIL HCL 5 MG PO TABS: 5.0000 mg | ORAL_TABLET | Freq: Every day | ORAL | 0 refills | Status: AC

## 2020-12-02 MED ORDER — ONDANSETRON HCL 4 MG PO TABS: 4.0000 mg | ORAL_TABLET | Freq: Four times a day (QID) | ORAL | 0 refills | Status: DC | PRN

## 2020-12-02 NOTE — Progress Notes (Signed)
Location:   [facility] Room Number: 123-P Place of Service:  SNF (31)    CODE STATUS: full code   Allergies  Allergen Reactions  . Ace Inhibitors Hives, Shortness Of Breath and Swelling  . Aspirin Other (See Comments)    Blood in stool and nose bleed  . Codeine Swelling  . Pineapple Shortness Of Breath and Swelling  . Plasticized Base [Plastibase] Hives and Swelling    No plastics  . Ultram [Tramadol] Swelling  . Adhesive [Tape] Swelling and Rash  . Naprosyn [Naproxen] Swelling  . Biaxin [Clarithromycin] Nausea And Vomiting and Other (See Comments)    Dizziness.  . Clindamycin/Lincomycin Other (See Comments)    Burning sensation throat, abdomen  . Linzess [Linaclotide]     DIARRHEA, FECAL INCONTINENCE  . Metronidazole     NAUSEA AND WEAKNESS  . Nsaids   . Penicillins     Lip swelling, Hives Has patient had a PCN reaction causing immediate rash, facial/tongue/throat swelling, SOB or lightheadedness with hypotension:YES Has patient had a PCN reaction causing severe rash involving mucus membranes or skin necrosis: NO Has patient had a PCN reaction that required hospitalization NO Has patient had a PCN reaction occurring within the last 10 years: NO If all of the above answers are "NO", then may proceed with Cephalosporin use.  . Varenicline Tartrate Swelling  . Xarelto [Rivaroxaban]     Bleeding    Chief Complaint  Patient presents with  . Discharge Note    HPI:  She is being discharged to home with home health for pt/ot. She does not need any dme; she has all needed dme at home. She will need her prescriptions written and will need to follow up with her medical provider. She had been hospitalized for multiple falls and acute uti. She was admitted to this facility for short term rehab. She has participated in pt/ot to improve upon her level of independence with her adls. She is now ready to continue therapy on a home health basis.    Past Medical  History:  Diagnosis Date  . 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 ASCVD (arteriosclerotic cardiovascular disease)    a. cath in 4/04,-80%LAD; 70% RCA, -> DESx2; residual 60% distal RCA; nl EF  b. Nuc 02/2016 aborted due to bradycardia. Will need to be rescheduled   . Carotid artery occlusion   . Cerebrovascular disease   . CHB (complete heart block) ([place])    a. s/p PPM on 03/24/16 with a St. Jude (serial number [id number]) pacemaker  . Chronic kidney disease (CKD), stage III (moderate) ([facility])   . Clostridium difficile colitis 03/2013   a. 03/2013  . COPD (chronic obstructive pulmonary disease) ([place])   . Coronary disease   . Diverticulosis   . GERD (gastroesophageal reflux disease)   . Gout   . H pylori ulcer   . Hyperlipidemia    chose to stop statin, takes Zetia  . Hypertension    not currently on medication  . IBS (irritable bowel syndrome)   . PVD (peripheral vascular disease) ([place])    a. moderate to severely decreased ABI's in 8/08, sig. aortic inflow disease b. repeat ABI's in 2011 improved- mild disease on the left and moderate to severe disease on the right  . S/P placement of cardiac pacemaker    a. 03/24/16: St. Jude (serial number [id number]) pacemaker  . Symptomatic anemia 08/15/2016  . TIA (transient ischemic attack) 2012  . Tobacco abuse  a. 50 pack years continuing at 1/2 pack per day  . Tobacco use disorder 10/15/2015    Past Surgical History:  Procedure Laterality Date  . ABDOMINAL HYSTERECTOMY    . carpel tunnel release     bilateral  . CHOLECYSTECTOMY    . COLONOSCOPY  2009  . COLONOSCOPY N/A 08/04/2016   incomplete due to prep.   . COLONOSCOPY N/A 08/05/2016   Dr. Oneida Alar: moderately redundant rectosigmoid and sigmoid, normal TI, single large-mouthed diverticulum in hepatic flexure, external hemorrhoids, path negative for microscopic colitis   . ENTEROSCOPY N/A 08/18/2016   Procedure: ENTEROSCOPY;  Surgeon: Daneil Dolin, MD;  [facility];   Service: Endoscopy;  Laterality: N/A;  Pediatric colonoscope  . EP IMPLANTABLE DEVICE N/A 03/24/2016   Procedure: Pacemaker Implant;  Surgeon: [name], MD;  Location: [facility];  Service: Cardiovascular;  Laterality: N/A;  . ESOPHAGOGASTRODUODENOSCOPY N/A 08/04/2016   Dr. Oneida Alar: non-critical Schatzki's ring, small hiatal hernia, +H.pylori gastritis on path.   Marland Kitchen GIVENS CAPSULE STUDY N/A 08/16/2016   Procedure: GIVENS CAPSULE STUDY;  Surgeon: Daneil Dolin, MD;  Location[facility];  Service: Endoscopy;  Laterality: N/A;  . TOTAL ABDOMINAL HYSTERECTOMY W/ BILATERAL SALPINGOOPHORECTOMY  1979    Social History   Socioeconomic History  . Marital status: Widowed    Spouse name: Not on file  . Number of children: Not on file  . Years of education: Not on file  . Highest education level: Not on file  Occupational History  . Not on file  Tobacco Use  . Smoking status: Every Day    Packs/day: 1.00    Years: 50.00    Pack years: 50.00    Types: Cigarettes    Start date: 02/02/1966  . Smokeless tobacco: Never  . Tobacco comments:    1/2 pack daily  Vaping Use  . Vaping Use: Never used  Substance and Sexual Activity  . Alcohol use: No    Alcohol/week: 0.0 standard drinks    Comment: H/O of 6 pack per day quiting in 2003  . Drug use: No  . Sexual activity: Not Currently  Other Topics Concern  . Not on file  Social History Narrative  . Not on file   Social Determinants of Health   Financial Resource Strain: Not on file  Food Insecurity: Not on file  Transportation Needs: Not on file  Physical Activity: Not on file  Stress: Not on file  Social Connections: Not on file  Intimate Partner Violence: Not on file   Family History  Problem Relation Age of Onset  . Liver disease Mother 1  . Hypertension Mother   . Cerebral aneurysm Father   . Aneurysm Father 21  . Cancer Sister   . Liver disease Brother   . Liver disease Brother   . Liver disease Brother   .  Diabetes type II Brother   . Alcohol abuse Brother   . Colon cancer Neg Hx     VITAL SIGNS BP 128/88   Pulse (!) 59   Temp 98.1 F (36.7 C)   Resp 20   Ht 5\' 7"  (1.702 m)   Wt 146 lb (66.2 kg)   SpO2 94%   BMI 22.87 kg/m   Patient's Medications  New Prescriptions   No medications on file  Previous Medications   ACETAMINOPHEN (TYLENOL) 325 MG TABLET    Take 2 tablets (650 mg total) by mouth every 6 (six) hours as needed for  mild pain (or Fever >/= 101).   BALSAM PERU-CASTOR OIL (VENELEX) OINT    Apply topically.   CALCIUM CITRATE-VITAMIN D (CITRACAL+D) 315-200 MG-UNIT PER TABLET    Take 1 tablet by mouth daily.   CYANOCOBALAMIN (VITAMIN B 12 PO)    Take 250 mcg by mouth daily.   LIDOCAINE 4 % PTCH    Apply topically in the morning and at bedtime. apply to skin in AM and remove in PM   MULTIPLE VITAMIN (MULTIVITAMIN WITH MINERALS) TABS TABLET    Take 1 tablet by mouth daily.   NICOTINE (NICODERM CQ - DOSED IN MG/24 HOURS) 14 MG/24HR PATCH    Place 14 mg onto the skin daily.   NON FORMULARY    Diet: NAS  allergic to pineapple   NYSTATIN (MYCOSTATIN/NYSTOP) POWDER    Apply 1 application topically 3 (three) times daily. Beneath breast and stomach   OMEPRAZOLE (PRILOSEC) 20 MG CAPSULE    Take 20 mg by mouth daily.  Modified Medications   Modified Medication Previous Medication   ALBUTEROL (VENTOLIN HFA) 108 (90 BASE) MCG/ACT INHALER albuterol (PROVENTIL HFA;VENTOLIN HFA) 108 (90 Base) MCG/ACT inhaler      Inhale 2 puffs into the lungs every 4 (four) hours as needed for wheezing or shortness of breath.    Inhale 2 puffs into the lungs every 4 (four) hours as needed for wheezing or shortness of breath.   CLOTRIMAZOLE (LOTRIMIN) 1 % CREAM clotrimazole (LOTRIMIN) 1 % cream      Apply 1 application topically 2 (two) times daily. Beneath stomach    Apply 1 application topically 2 (two) times daily. Beneath stomach   DONEPEZIL (ARICEPT) 5 MG TABLET donepezil (ARICEPT) 5 MG tablet      Take  1 tablet (5 mg total) by mouth at bedtime.    Take 1 tablet (5 mg total) by mouth at bedtime.   ESCITALOPRAM (LEXAPRO) 10 MG TABLET escitalopram (LEXAPRO) 10 MG tablet      Take 1 tablet (10 mg total) by mouth daily.    Take 1 tablet (10 mg total) by mouth daily.   EZETIMIBE (ZETIA) 10 MG TABLET ezetimibe (ZETIA) 10 MG tablet      Take 1 tablet (10 mg total) by mouth daily.    Take 1 tablet (10 mg total) by mouth daily.   FERROUS SULFATE 325 (65 FE) MG TABLET ferrous sulfate 325 (65 FE) MG tablet      Take 1 tablet (325 mg total) by mouth daily with breakfast.    Take 325 mg by mouth daily with breakfast.   FUROSEMIDE (LASIX) 20 MG TABLET furosemide (LASIX) 20 MG tablet      Take 1 tablet (20 mg total) by mouth daily.    Take 1 tablet (20 mg total) by mouth daily.   ONDANSETRON (ZOFRAN) 4 MG TABLET ondansetron (ZOFRAN) 4 MG tablet      Take 1 tablet (4 mg total) by mouth every 6 (six) hours as needed for nausea.    Take 1 tablet (4 mg total) by mouth every 6 (six) hours as needed for nausea.  Discontinued Medications   No medications on file     SIGNIFICANT DIAGNOSTIC EXAMS   PREVIOUS   11-15-20: chest x-ray: No acute disease.  Aortic Atherosclerosis  and Emphysema  11-18-20: 2-d echo:  Left ventricular ejection fraction, by estimation, is 65 to 70%. The  left ventricle has hyperdynamic function. The left ventricle has no  regional wall motion abnormalities. There is mild left  ventricular  hypertrophy. Left ventricular diastolic  parameters are consistent with Grade I diastolic dysfunction (impaired  relaxation).   NO NEW LABS.   LABS REVIEWED: PREVIOUS   11-15-20: wbc 5.9; hgb 8.3; hct 25.7; mcv 93.1 plt 281; glucose 82; bun 36; creat 1.46; k+ 4.5; na++ 135; ca 8.9; GFR 36; liver normal albumin 3.5 vit B 12: 693; folate 10.5; iron 34; tibc 186; ferritin 492 urine culture: 10,000. 11-18-20: wbc 5.7; hgb 9.2; hct 28.4; mcv 92.2 plt 285; glucose 76; bun 25; creat 1.05; k+ 4.7; na++ 133; ca  8.4 GFR 55; mag 1.9  NO NEW LABS.   Review of Systems  Constitutional:  Negative for malaise/fatigue.  Respiratory:  Negative for cough and shortness of breath.   Cardiovascular:  Negative for chest pain, palpitations and leg swelling.  Gastrointestinal:  Negative for abdominal pain, constipation and heartburn.  Musculoskeletal:  Negative for back pain, joint pain and myalgias.  Skin: Negative.   Neurological:  Negative for dizziness.  Psychiatric/Behavioral:  The patient is not nervous/anxious.    Physical Exam Constitutional:      General: She is not in acute distress.    Appearance: She is well-developed. She is not diaphoretic.  Neck:     Thyroid: No thyromegaly.  Cardiovascular:     Rate and Rhythm: Normal rate and regular rhythm.     Pulses: Normal pulses.     Heart sounds: Normal heart sounds.  Pulmonary:     Effort: Pulmonary effort is normal. No respiratory distress.     Breath sounds: Normal breath sounds.  Abdominal:     General: Bowel sounds are normal. There is no distension.     Palpations: Abdomen is soft.     Tenderness: There is no abdominal tenderness.  Musculoskeletal:        General: Normal range of motion.     Cervical back: Neck supple.     Right lower leg: No edema.     Left lower leg: No edema.  Lymphadenopathy:     Cervical: No cervical adenopathy.  Skin:    General: Skin is warm and dry.  Neurological:     Mental Status: She is alert. Mental status is at baseline.  Psychiatric:        Mood and Affect: Mood normal.     ASSESSMENT/ PLAN:   Patient is being discharged with the following home health services:  pt/ot to evaluate and treat as indicated for gait balance strength adl training.   Patient is being discharged with the following durable medical equipment:  none needed   Patient has been advised to f/u with their PCP in 1-2 weeks to bring them up to date on their rehab stay.  Social services at facility was responsible for arranging  this appointment.  Pt was provided with a 30 day supply of prescriptions for medications and refills must be obtained from their PCP.  For controlled substances, a more limited supply may be provided adequate until PCP appointment only.  A 30 day supply of her prescription medications have been sent to [facility]   Time spent with patient 35 minutes: medications; home health dme  needs    Ok Edwards NP [facility]  Contact [phone] Monday through Friday 8am- 5pm  After hours call [phone]

## 2020-12-03 ENCOUNTER — Telehealth: Payer: Self-pay

## 2020-12-03 DIAGNOSIS — R600 Localized edema: Secondary | ICD-10-CM | POA: Insufficient documentation

## 2020-12-03 NOTE — Telephone Encounter (Signed)
Charlotte Henry from [facility] alled  regarding pt, pt does not have pcp, asking are you willing to sign for pt orders? Pt needs pt/ot and also has a pressure ulcer stage 2, needs care for that in home as well.   [phone]  Verbal given

## 2020-12-06 IMAGING — US BILATERAL CAROTID DUPLEX ULTRASOUND
1 series · 13 of 24 positions shown · non-contrast
Comparison: Prior carotid ultrasound 12/16/2009

CLINICAL DATA: 77-year-old female with a right common carotid bruit
on physical exam.

EXAM:
BILATERAL CAROTID DUPLEX ULTRASOUND
TECHNIQUE: Gray scale imaging, color Doppler and duplex ultrasound were
performed of bilateral carotid and vertebral arteries in the neck.

[Series 1: bilateral carotid duplex ultrasound · 0.06mm/px · 13 of 68 slices shown]
[im 1/68]
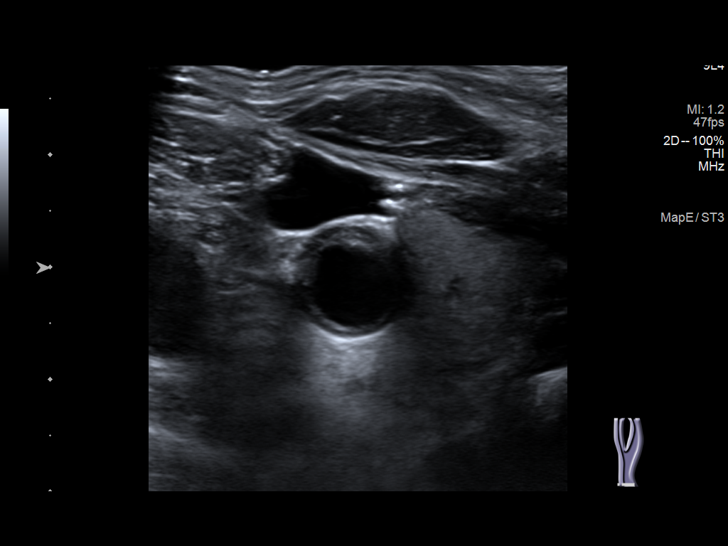
[im 6/68]
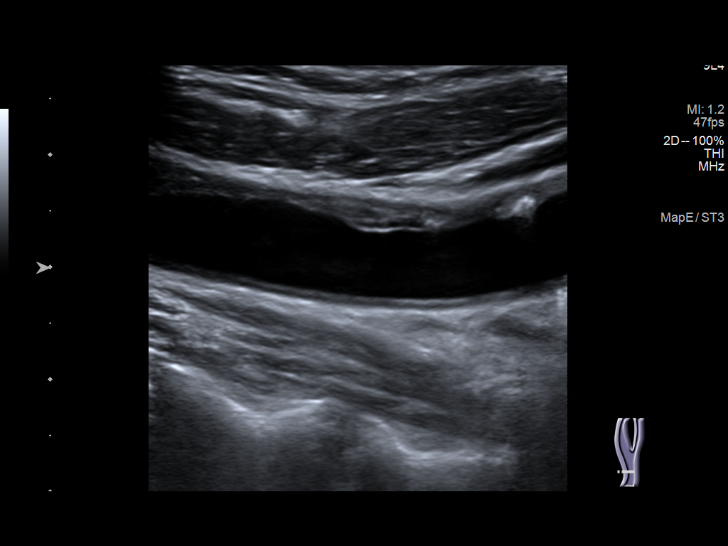
[im 12/68]
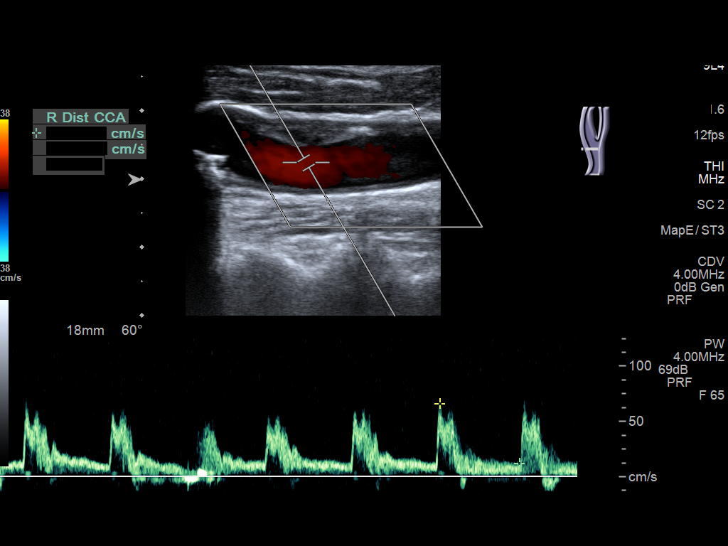
[im 18/68]
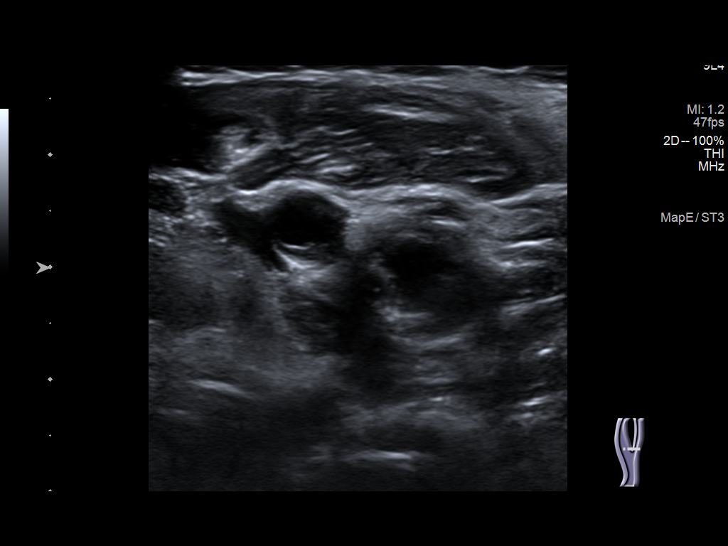
[im 24/68]
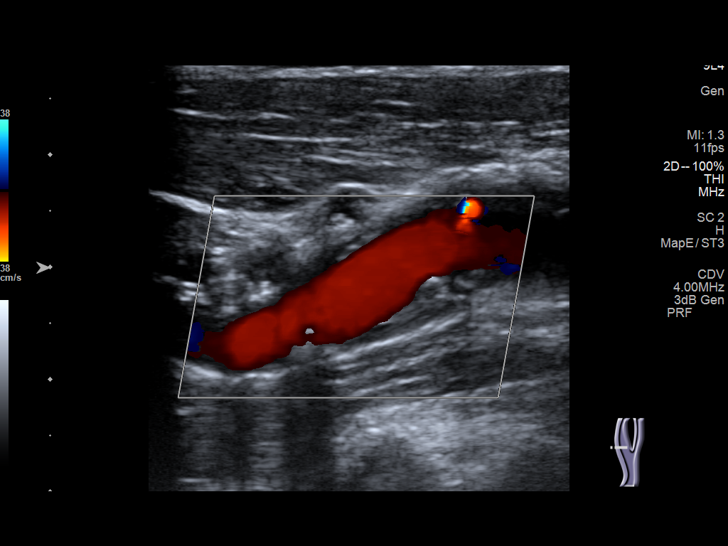
[im 30/68]
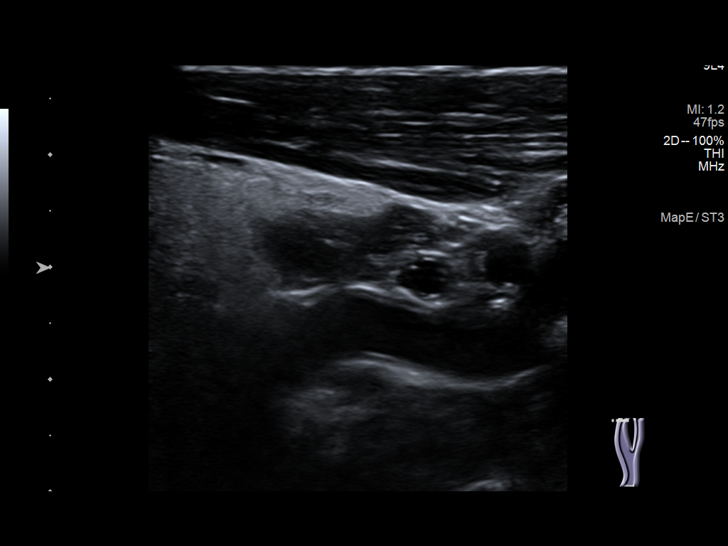
[im 35/68]
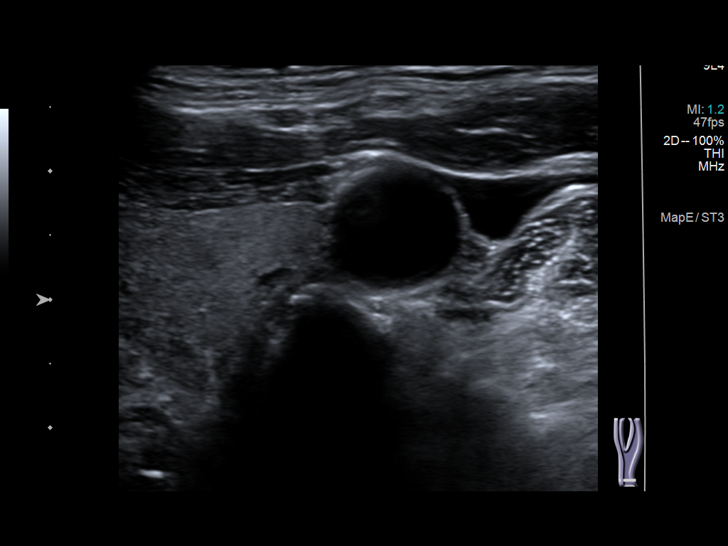
[im 38/68]
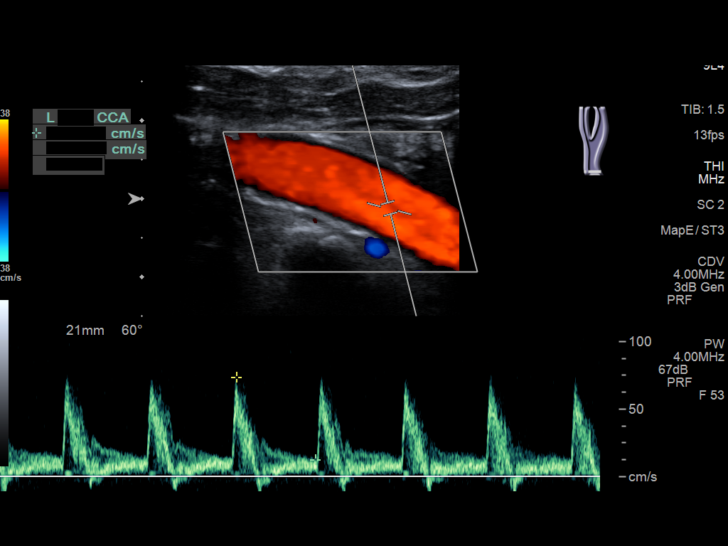
[im 44/68]
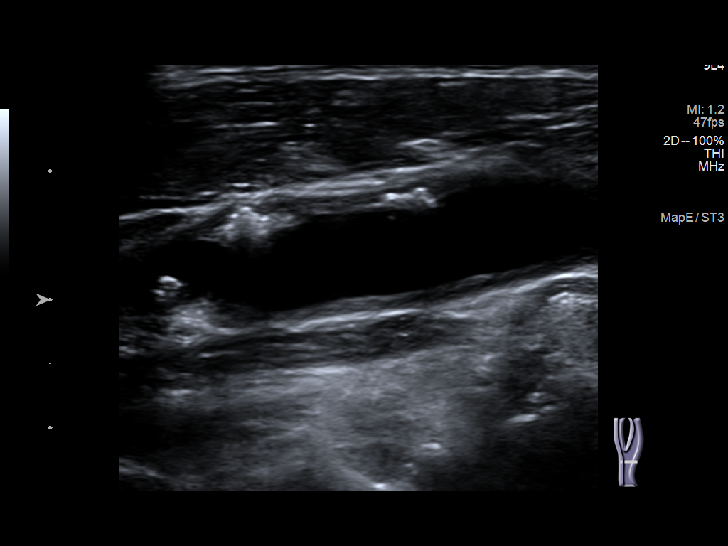
[im 50/68]
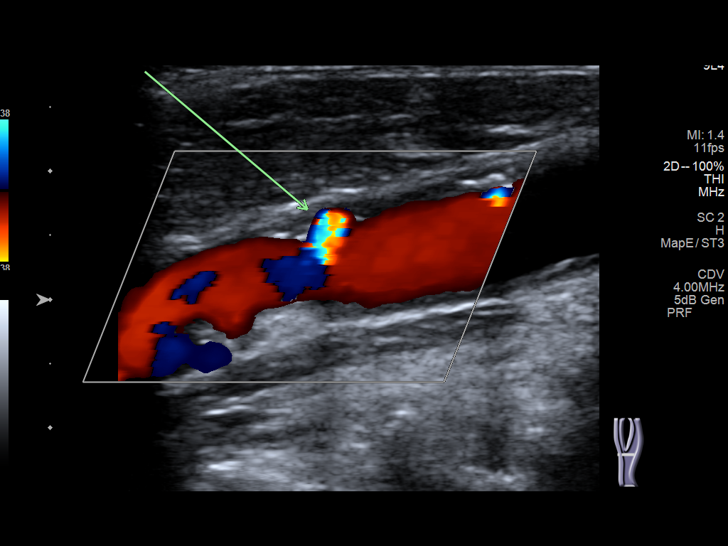
[im 56/68]
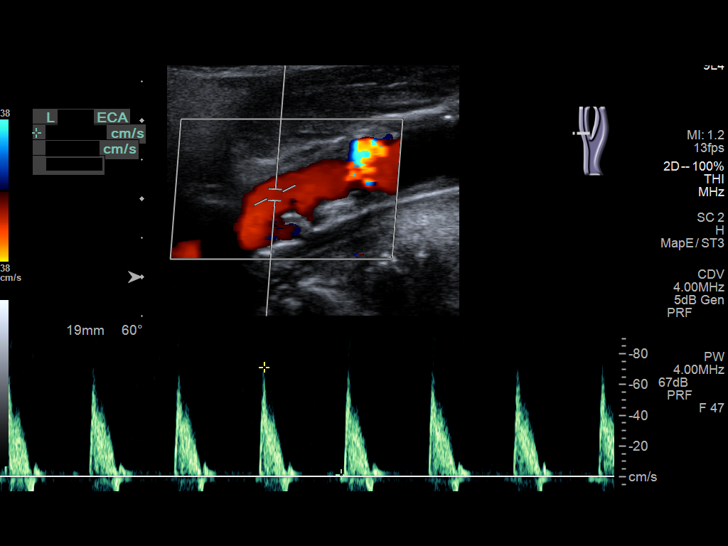
[im 62/68]
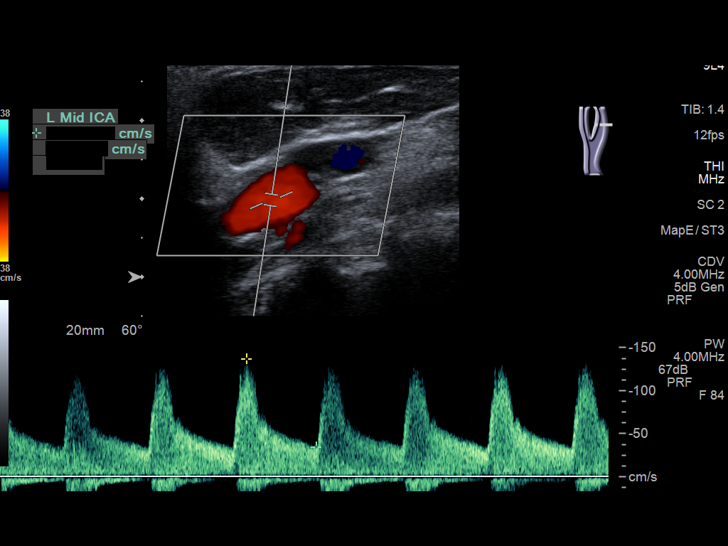
[im 68/68]
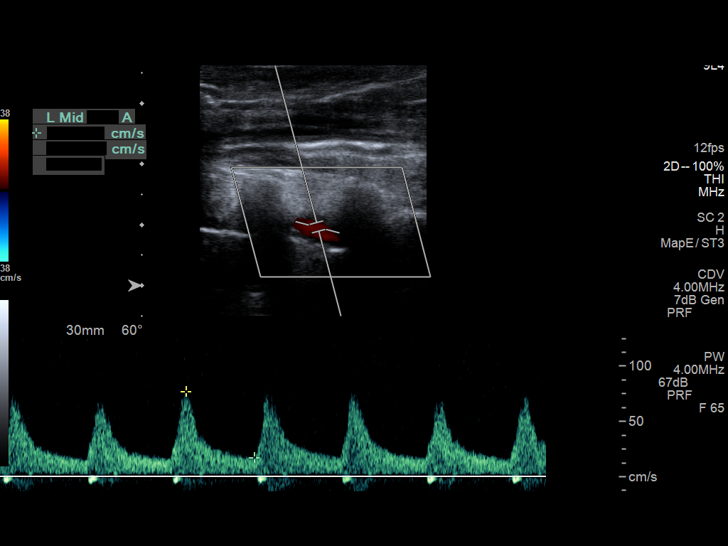

[13 of 24 positions shown; findings below may reference images not displayed]

FINDINGS: Criteria: Quantification of carotid stenosis is based on velocity
parameters that correlate the residual internal carotid diameter
with NASCET-based stenosis levels, using the diameter of the distal
internal carotid lumen as the denominator for stenosis measurement.

The following velocity measurements were obtained:

RIGHT
ICA: 92/22 cm/sec
CCA: 84/8 cm/sec

SYSTOLIC ICA/CCA RATIO:

ECA:  95 cm/sec

LEFT

ICA: 137/35 cm/sec

CCA: 55/11 cm/sec

SYSTOLIC ICA/CCA RATIO:

ECA:  71 cm/sec

RIGHT CAROTID ARTERY: Modest heterogeneous and irregular
atherosclerotic plaque throughout the distal common carotid artery
extending through the carotid bifurcation into the proximal internal
and external carotid arteries. By peak systolic velocity criteria,
the estimated stenosis remains less than 50%.

RIGHT VERTEBRAL ARTERY:  Patent with normal antegrade flow.

LEFT CAROTID ARTERY: Heterogeneous and irregular atherosclerotic
plaque in the distal common carotid artery and proximal internal
carotid artery. By peak systolic velocity criteria, the internal
carotid artery stenosis is within the 50-69% diameter range.

LEFT VERTEBRAL ARTERY:  Patent with normal antegrade flow.
IMPRESSION: 1. Mild (1-49%) stenosis proximal right internal carotid artery
secondary to heterogeneous and irregular/ulcerated atherosclerotic
plaque.
2. Moderate (50-69%) stenosis proximal left internal carotid artery
secondary to heterogeneous and irregular/ulcerated atherosclerotic
plaque.
3. The vertebral arteries are patent with normal antegrade flow.

## 2020-12-08 ENCOUNTER — Inpatient Hospital Stay (HOSPITAL_COMMUNITY): Payer: Medicare Other | Admitting: Hematology and Oncology

## 2020-12-09 ENCOUNTER — Encounter: Payer: Self-pay | Admitting: Adult Health

## 2020-12-11 ENCOUNTER — Other Ambulatory Visit: Payer: Self-pay | Admitting: Internal Medicine

## 2020-12-15 ENCOUNTER — Telehealth: Payer: Self-pay | Admitting: *Deleted

## 2020-12-15 NOTE — Telephone Encounter (Signed)
Received call from [place], [place] with [facility] [phone]~ telephone.   Requested VO for HH OT 1x weekly x1 week, 2x weekly x3 weeks, 1x weekly x1 week for independent ADL's, upper extremity strengthening, and safety.   Advised that patient is no longer seen at this practice. States that patient has Dr Dennard Schaumann listed as PCP in [facility] Orders. Advised that she has not been accepted by Dr. Dennard Schaumann as he has a full patient panel.   Please advise.

## 2020-12-16 NOTE — Telephone Encounter (Signed)
Call placed to [place], [place] with [facility] [phone]~ telephone.  VO given for prior plan of care.   Also advised that patient is scheduled with Dr Posey Pronto on 01/17/2021. NP will sign orders until patient establishes care with Dr. Posey Pronto.

## 2020-12-16 NOTE — Telephone Encounter (Signed)
I will not be able to accept this patient at this time.  It looks like she may have a new patient appt with PCP in [place] in the next month.  I am okay with giving VO for home health until she establishes care with new PCP.

## 2020-12-16 NOTE — Telephone Encounter (Signed)
Will you be able to accept this patient?  If not, how do we proceed with [place] orders?

## 2020-12-22 ENCOUNTER — Telehealth: Payer: Self-pay | Admitting: *Deleted

## 2020-12-22 NOTE — Telephone Encounter (Signed)
Received call from [place] with [facility] [phone]~ telephone.   Reports that patient BP noted at 98/48 today with HR of 61. States that patient denies dizziness, HA , vision changes, but does states that she feels weak.   Advised to increase fluids to try to bring BP up. If Sx worsen or BP remains low, go to ER D/Y Hx of anemia that can cause hypotension and weakness.   Verbalized understanding.

## 2020-12-23 ENCOUNTER — Ambulatory Visit: Payer: Medicare Other | Admitting: Internal Medicine

## 2020-12-27 ENCOUNTER — Telehealth: Payer: Self-pay

## 2021-01-06 ENCOUNTER — Telehealth (HOSPITAL_COMMUNITY): Payer: Medicare Other | Admitting: Nurse Practitioner

## 2021-01-07 ENCOUNTER — Telehealth: Payer: Self-pay | Admitting: *Deleted

## 2021-01-07 NOTE — Telephone Encounter (Signed)
VO okay, it looks like she has appointment coming up to establish care with new PCP.  Future needed orders will need to be directed to new PCP after that time.

## 2021-01-07 NOTE — Telephone Encounter (Signed)
Received call from [place], [facility] PT with [facility] [phone]~ telephone.   Reports that patient has not established with new provider at this time.   Requested VO to extend [facility] PT x3 visits for home mobility, safety and fall prevention.   VO given.

## 2021-01-10 ENCOUNTER — Ambulatory Visit (INDEPENDENT_AMBULATORY_CARE_PROVIDER_SITE_OTHER): Payer: Medicare Other

## 2021-01-10 ENCOUNTER — Telehealth: Payer: Self-pay

## 2021-01-10 ENCOUNTER — Telehealth: Payer: Self-pay | Admitting: *Deleted

## 2021-01-10 DIAGNOSIS — I442 Atrioventricular block, complete: Secondary | ICD-10-CM
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
## 2021-01-10 NOTE — Telephone Encounter (Signed)
Noted.  I have never seen this patient and will not be placing an order for social work.  She needs to establish with a new primary care and has an upcoming appointment with a new PCP.
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
## 2021-01-12 LAB — CUP PACEART REMOTE DEVICE CHECK
Battery Remaining Longevity: 50 mo
Battery Remaining Percentage: 49 %
Battery Voltage: 2.96 V
Brady Statistic AP VP Percent: 67 %
Brady Statistic AP VS Percent: 1 %
Brady Statistic AS VP Percent: 33 %
Brady Statistic AS VS Percent: 1 %
Brady Statistic RA Percent Paced: 66 %
Brady Statistic RV Percent Paced: 99 %
Date Time Interrogation Session: 20220808020013
Implantable Lead Implant Date: 20171020
Implantable Lead Implant Date: 20171020
Implantable Lead Location: 753859
Implantable Lead Location: 753860
Implantable Pulse Generator Implant Date: 20171020
Lead Channel Impedance Value: 400 Ohm
Lead Channel Impedance Value: 410 Ohm
Lead Channel Pacing Threshold Amplitude: 0.75 V
Lead Channel Pacing Threshold Amplitude: 1 V
Lead Channel Pacing Threshold Pulse Width: 0.4 ms
Lead Channel Pacing Threshold Pulse Width: 0.4 ms
Lead Channel Sensing Intrinsic Amplitude: 1.8 mV
Lead Channel Sensing Intrinsic Amplitude: 5.1 mV
Lead Channel Setting Pacing Amplitude: 2 V
Lead Channel Setting Pacing Amplitude: 2.5 V
Lead Channel Setting Pacing Pulse Width: 0.4 ms
Lead Channel Setting Sensing Sensitivity: 3.5 mV
Pulse Gen Model: 2272
Pulse Gen Serial Number: 3180497

## 2021-01-17 ENCOUNTER — Ambulatory Visit: Payer: Medicare Other | Admitting: Internal Medicine

## 2021-01-19 ENCOUNTER — Telehealth: Payer: Self-pay

## 2021-01-19 NOTE — Telephone Encounter (Signed)
Received voicemail from [place] at [facility] to follo wup on order faxed for patient; needs to be signed and dated. Please advise at [phone]

## 2021-01-19 NOTE — Telephone Encounter (Signed)
Have you seen this order?

## 2021-01-19 NOTE — Telephone Encounter (Signed)
Please have them fax order again.

## 2021-01-19 NOTE — Telephone Encounter (Signed)
I have not

## 2021-01-24 NOTE — Telephone Encounter (Signed)
Left message on Charlotte Henry's voicemail with our fax number for her to resend the order. Also left office number in message for her to call back with any questions.

## 2021-01-25 ENCOUNTER — Telehealth: Payer: Self-pay

## 2021-01-25 NOTE — Telephone Encounter (Signed)
Received call from Karl Luke to request copy of medical POA paperwork. Please advise at [phone].

## 2021-01-26 NOTE — Telephone Encounter (Signed)
Patient is no longer seen at our office.   I do not know who to contact for medical POA other than a lawyer.

## 2021-01-28 NOTE — Telephone Encounter (Signed)
Karl Luke stated we have a copy of the medical POA on file and she's asking for a copy of it. Please advise.

## 2021-01-28 NOTE — Telephone Encounter (Signed)
I don't see anything in the chart.

## 2021-01-31 ENCOUNTER — Other Ambulatory Visit (HOSPITAL_COMMUNITY): Payer: Self-pay | Admitting: Surgery

## 2021-01-31 DIAGNOSIS — C3411 Malignant neoplasm of upper lobe, right bronchus or lung: Secondary | ICD-10-CM

## 2021-01-31 DIAGNOSIS — D509 Iron deficiency anemia, unspecified: Secondary | ICD-10-CM

## 2021-01-31 DIAGNOSIS — D649 Anemia, unspecified: Secondary | ICD-10-CM

## 2021-02-01 NOTE — Telephone Encounter (Signed)
Called [place] back and gave her the number for [facility] (Medical records); advised her to call them to follow up.

## 2021-02-01 NOTE — Telephone Encounter (Signed)
I do not see any forms in her chart.   She may need to discuss with medical records.

## 2021-02-01 NOTE — Telephone Encounter (Signed)
Spoke with Ms. Moorehead; stated they filled out the medical POA paperwork at this office while the patient was under Dr. Dorian Heckle care; they didn't go through an attorney. Please advise at [phone].

## 2021-02-02 ENCOUNTER — Inpatient Hospital Stay (HOSPITAL_COMMUNITY): Payer: Medicare Other | Attending: Hematology

## 2021-02-03 NOTE — Progress Notes (Signed)
Remote pacemaker transmission.   

## 2021-02-09 ENCOUNTER — Ambulatory Visit (HOSPITAL_COMMUNITY): Payer: Medicare Other | Admitting: Physician Assistant

## 2021-02-12 DIAGNOSIS — R609 Edema, unspecified: Secondary | ICD-10-CM | POA: Diagnosis not present

## 2021-02-12 DIAGNOSIS — M1990 Unspecified osteoarthritis, unspecified site: Secondary | ICD-10-CM | POA: Diagnosis not present

## 2021-02-12 DIAGNOSIS — R531 Weakness: Secondary | ICD-10-CM | POA: Diagnosis not present

## 2021-02-12 DIAGNOSIS — G629 Polyneuropathy, unspecified: Secondary | ICD-10-CM | POA: Diagnosis not present

## 2021-03-04 ENCOUNTER — Emergency Department (HOSPITAL_COMMUNITY): Payer: Medicare Other

## 2021-03-04 ENCOUNTER — Encounter (HOSPITAL_COMMUNITY): Payer: Self-pay

## 2021-03-04 ENCOUNTER — Encounter (HOSPITAL_COMMUNITY): Payer: Self-pay | Admitting: Hematology

## 2021-03-04 ENCOUNTER — Emergency Department (HOSPITAL_COMMUNITY)
Admission: EM | Admit: 2021-03-04 | Discharge: 2021-03-04 | Disposition: A | Discharge status: Home / Self Care | Payer: Medicare Other | Attending: Emergency Medicine | Admitting: Emergency Medicine

## 2021-03-04 DIAGNOSIS — W01198A Fall on same level from slipping, tripping and stumbling with subsequent striking against other object, initial encounter: Secondary | ICD-10-CM | POA: Diagnosis not present

## 2021-03-04 DIAGNOSIS — J449 Chronic obstructive pulmonary disease, unspecified: Secondary | ICD-10-CM | POA: Diagnosis not present

## 2021-03-04 DIAGNOSIS — F17210 Nicotine dependence, cigarettes, uncomplicated: Secondary | ICD-10-CM | POA: Insufficient documentation

## 2021-03-04 DIAGNOSIS — N183 Chronic kidney disease, stage 3 unspecified: Secondary | ICD-10-CM | POA: Insufficient documentation

## 2021-03-04 DIAGNOSIS — Z7984 Long term (current) use of oral hypoglycemic drugs: Secondary | ICD-10-CM | POA: Insufficient documentation

## 2021-03-04 DIAGNOSIS — M25551 Pain in right hip: Secondary | ICD-10-CM | POA: Diagnosis not present

## 2021-03-04 DIAGNOSIS — E1122 Type 2 diabetes mellitus with diabetic chronic kidney disease: Secondary | ICD-10-CM | POA: Diagnosis not present

## 2021-03-04 DIAGNOSIS — S0003XA Contusion of scalp, initial encounter: Secondary | ICD-10-CM | POA: Diagnosis not present

## 2021-03-04 DIAGNOSIS — Y9301 Activity, walking, marching and hiking: Secondary | ICD-10-CM | POA: Insufficient documentation

## 2021-03-04 DIAGNOSIS — I129 Hypertensive chronic kidney disease with stage 1 through stage 4 chronic kidney disease, or unspecified chronic kidney disease: Secondary | ICD-10-CM | POA: Insufficient documentation

## 2021-03-04 DIAGNOSIS — S0990XA Unspecified injury of head, initial encounter: Secondary | ICD-10-CM | POA: Diagnosis not present

## 2021-03-04 DIAGNOSIS — M25521 Pain in right elbow: Secondary | ICD-10-CM | POA: Insufficient documentation

## 2021-03-04 DIAGNOSIS — W19XXXA Unspecified fall, initial encounter: Secondary | ICD-10-CM

## 2021-03-04 MED ORDER — ACETAMINOPHEN 325 MG PO TABS
650.0000 mg | ORAL_TABLET | Freq: Once | ORAL | Status: AC
  Administered 2021-03-04: 650 mg via ORAL
  Filled 2021-03-04: qty 2

## 2021-03-04 NOTE — Discharge Instructions (Addendum)
All imaging is negative for any breaks or other injuries. Use tylenol and ice packs for pain.   Get help right away if: You have: A severe headache that is not helped by medicine. Trouble walking or weakness in your arms and legs. Clear or bloody fluid coming from your nose or ears. Changes in your vision. A seizure. Increased confusion or irritability. Your symptoms get worse. You are sleepier than normal and have trouble staying awake. You lose your balance. Your pupils change size. Your speech is slurred. Your dizziness gets worse. You vomit.
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
## 2021-03-04 NOTE — ED Provider Notes (Signed)
[facility] EMERGENCY DEPARTMENT Provider Note   CSN: [id number] Arrival date & time: 03/04/21  1259     History Chief Complaint  Patient presents with   Fall    [address]    Charlotte Henry is a 80 y.o. female with medical history significant for hypertension, peripheral vascular disease, CKD 3, COPD, pacemaker status. She presents for evaluation after fall. Hx is given by the patient's son who is at bedside. He states that she has 24 hour care and a cargiver went to answer the door for her hospice nurse. They were walking back to the patients' room and saw her walking in the hall. She stumbled, fell backward and hit her head against the wall. She suffered a hematoma to the occiput, but no loc or lacerations. The patient also hit her elbow. She was helped down to the ground by her caretakers. The son states that he believes she is on hospice because she has "low muscle mass." She has frequent falls and is supposed to walk with her walker or with a caregiver, but was not today. The patient c/o moderate pain to the back of her head, neck and R elbow.    Fall Pertinent negatives include no headaches.      Past Medical History:  Diagnosis Date   Acute lower UTI 11/15/2020   Arterial fibromuscular dysplasia (HCC)    ASCVD (arteriosclerotic cardiovascular disease)    a. cath in 4/04,-80%LAD; 70% RCA, -> DESx2; residual 60% distal RCA; nl EF  b. Nuc 02/2016 aborted due to bradycardia. Will need to be rescheduled    Carotid artery occlusion    Cerebrovascular disease    CHB (complete heart block) ([place])    a. s/p PPM on 03/24/16 with a St. Jude (serial number [id number]) pacemaker   Chronic kidney disease (CKD), stage III (moderate) (HCC)    Clostridium difficile colitis 03/2013   a. 03/2013   COPD (chronic obstructive pulmonary disease) ([place])    Coronary disease    Diverticulosis    GERD (gastroesophageal reflux disease)    Gout    H pylori ulcer    Hyperlipidemia    chose to stop statin,  takes Zetia   Hypertension    not currently on medication   IBS (irritable bowel syndrome)    PVD (peripheral vascular disease) (HCC)    a. moderate to severely decreased ABI's in 8/08, sig. aortic inflow disease b. repeat ABI's in 2011 improved- mild disease on the left and moderate to severe disease on the right   S/P placement of cardiac pacemaker    a. 03/24/16: St. Jude (serial number [id number]) pacemaker   Symptomatic anemia 08/15/2016   TIA (transient ischemic attack) 2012   Tobacco abuse    a. 50 pack years continuing at 1/2 pack per day   Tobacco use disorder 10/15/2015    Patient Active Problem List   Diagnosis Date Noted   Bilateral lower extremity edema 12/03/2020   Hypertension associated with chronic kidney disease due to type 2 diabetes mellitus ([place]) 12/02/2020   Aortic atherosclerosis ([place]) 12/02/2020   Type 2 diabetes mellitus with stage 3 chronic kidney disease, without long-term current use of insulin ([place]) 12/02/2020   Stage 3 chronic kidney disease due to type 2 diabetes mellitus ([place]) 12/02/2020   Hyperlipidemia associated with type 2 diabetes mellitus ([place]) 12/02/2020   Vascular dementia without behavioral disturbance ([place]) 12/02/2020   Major depression, recurrent, chronic ([place]) 12/02/2020   Bilateral cellulitis of lower leg 11/30/2020  Pressure injury of skin 11/16/2020   Hypotension 11/15/2020   Acute lower UTI 11/15/2020   Cellulitis 11/15/2020   Status cardiac pacemaker 11/15/2020   Multiple falls 11/15/2020   Iron deficiency anemia 04/28/2020   AAA (abdominal aortic aneurysm) ([place]) 02/17/2019   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[place]) 01/10/2018   Itching 07/19/2017   Gait instability 02/26/2017   Symptomatic anemia 08/15/2016   Protein-calorie malnutrition ([place]) 08/04/2016   Normocytic anemia 07/03/2016   CHB (complete heart block) (HCC)    COPD (chronic obstructive pulmonary disease) ([place])    Tobacco use disorder 10/15/2015   OA  (osteoarthritis) 12/30/2014   Carotid stenosis 03/11/2014   PVD (peripheral vascular disease) ([place]) 03/11/2014   Colon cancer screening 02/04/2014   Overactive bladder 12/31/2013   Rash and nonspecific skin eruption 05/19/2013   Peripheral neuropathy 07/01/2012   Chronic kidney disease (CKD), stage III (moderate) ([place]) 08/25/2010   Cardiovascular disease 08/16/2009   Cerebrovascular disease 08/16/2009   Peripheral vascular disease ([place]) 08/16/2009   Type 2 diabetes mellitus with diabetic neuropathy, unspecified ([place]) 10/26/2008   Hyperlipidemia 10/26/2008   Essential hypertension 10/26/2008   Asthmatic bronchitis 10/26/2008   Gastroesophageal reflux disease 10/26/2008    Past Surgical History:  Procedure Laterality Date   ABDOMINAL HYSTERECTOMY     carpel tunnel release     bilateral   CHOLECYSTECTOMY     COLONOSCOPY  2009   COLONOSCOPY N/A 08/04/2016   incomplete due to prep.    COLONOSCOPY N/A 08/05/2016   Dr. Oneida Alar: moderately redundant rectosigmoid and sigmoid, normal TI, single large-mouthed diverticulum in hepatic flexure, external hemorrhoids, path negative for microscopic colitis    ENTEROSCOPY N/A 08/18/2016   Procedure: ENTEROSCOPY;  Surgeon: Daneil Dolin, MD;  Location[facility];  Service: Endoscopy;  Laterality: N/A;  Pediatric colonoscope   EP IMPLANTABLE DEVICE N/A 03/24/2016   Procedure: Pacemaker Implant;  Surgeon: [name], MD;  Location: [place] [facility] LAB;  Service: Cardiovascular;  Laterality: N/A;   ESOPHAGOGASTRODUODENOSCOPY N/A 08/04/2016   Dr. Oneida Alar: non-critical Schatzki's ring, small hiatal hernia, +H.pylori gastritis on path.    GIVENS CAPSULE STUDY N/A 08/16/2016   Procedure: GIVENS CAPSULE STUDY;  Surgeon: Daneil Dolin, MD;  Location: [facility] ENDO SUITE;  Service: Endoscopy;  Laterality: N/A;   TOTAL ABDOMINAL HYSTERECTOMY W/ BILATERAL SALPINGOOPHORECTOMY  1979     OB History     Gravida  2   Para  2   Term  2   Preterm      AB       Living         SAB      IAB      Ectopic      Multiple      Live Births              Family History  Problem Relation Age of Onset   Liver disease Mother 29   Hypertension Mother    Cerebral aneurysm Father    Aneurysm Father 35   Cancer Sister    Liver disease Brother    Liver disease Brother    Liver disease Brother    Diabetes type II Brother    Alcohol abuse Brother    Colon cancer Neg Hx     Social History   Tobacco Use   Smoking status: Every Day    Packs/day: 1.00    Years: 50.00    Pack years: 50.00    Types: Cigarettes  Start date: 02/02/1966   Smokeless tobacco: Never   Tobacco comments:    1/2 pack daily  Vaping Use   Vaping Use: Never used  Substance Use Topics   Alcohol use: No    Alcohol/week: 0.0 standard drinks    Comment: H/O of 6 pack per day quiting in 2003   Drug use: No    Home Medications Prior to Admission medications   Medication Sig Start Date End Date Taking? Authorizing Provider  acetaminophen (TYLENOL) 325 MG tablet Take 2 tablets (650 mg total) by mouth every 6 (six) hours as needed for mild pain (or Fever >/= 101). 11/18/20   Roxan Hockey, MD  albuterol (VENTOLIN HFA) 108 (90 Base) MCG/ACT inhaler Inhale 2 puffs into the lungs every 4 (four) hours as needed for wheezing or shortness of breath. 12/02/20   Gerlene Fee, NP  Balsam [place]-Castor Oil [facility]) OINT Apply topically.    [provider]  calcium citrate-vitamin D (CITRACAL+D) 315-200 MG-UNIT per tablet Take 1 tablet by mouth daily.    [provider]  clotrimazole (LOTRIMIN) 1 % cream Apply 1 application topically 2 (two) times daily. Beneath stomach 12/02/20   Gerlene Fee, NP  Cyanocobalamin (VITAMIN B 12 PO) Take 250 mcg by mouth daily.    [provider]  donepezil (ARICEPT) 5 MG tablet Take 1 tablet (5 mg total) by mouth at bedtime. 12/02/20   Gerlene Fee, NP  escitalopram (LEXAPRO) 10 MG tablet Take 1 tablet (10 mg  total) by mouth daily. 12/02/20   Gerlene Fee, NP  ezetimibe (ZETIA) 10 MG tablet Take 1 tablet (10 mg total) by mouth daily. 12/02/20   Gerlene Fee, NP  ferrous sulfate 325 (65 FE) MG tablet Take 1 tablet (325 mg total) by mouth daily with breakfast. 12/02/20   Gerlene Fee, NP  furosemide (LASIX) 20 MG tablet Take 1 tablet (20 mg total) by mouth daily. 12/02/20   Gerlene Fee, NP  Lidocaine 4 % PTCH Apply topically in the morning and at bedtime. apply to skin in AM and remove in PM    [provider]  Multiple Vitamin (MULTIVITAMIN WITH MINERALS) TABS tablet Take 1 tablet by mouth daily. 08/05/16   Johnson, Clanford L, MD  nicotine (NICODERM CQ - DOSED IN MG/24 HOURS) 14 mg/24hr patch Place 14 mg onto the skin daily.    [provider]  NON FORMULARY Diet: NAS  allergic to pineapple    [provider]  nystatin (MYCOSTATIN/NYSTOP) powder Apply 1 application topically 3 (three) times daily. Beneath breast and stomach 05/19/20   Alycia Rossetti, MD  omeprazole (PRILOSEC) 20 MG capsule Take 20 mg by mouth daily.    [provider]  ondansetron (ZOFRAN) 4 MG tablet Take 1 tablet (4 mg total) by mouth every 6 (six) hours as needed for nausea. 12/02/20   Gerlene Fee, NP    Allergies    Ace inhibitors, Aspirin, Codeine, Pineapple, Plasticized base [plastibase], Ultram [tramadol], Adhesive [tape], Naprosyn [naproxen], Biaxin [clarithromycin], Clindamycin/lincomycin, Linzess [linaclotide], Metronidazole, Nsaids, Penicillins, Varenicline tartrate, and Xarelto [rivaroxaban]  Review of Systems   Review of Systems  Musculoskeletal:  Positive for neck pain.  Skin:  Negative for wound.  Neurological:  Negative for syncope and headaches.   Physical Exam Updated Vital Signs BP (!) 118/52 (BP Location: Right Arm)   Pulse (!) 59   Temp 98 F (36.7 C) (Temporal)   Resp 17   Ht 5\' 7"  (1.702  m)   Wt 63.5 kg   BMI 21.93 kg/m   Physical Exam Vitals  and nursing note reviewed.  Constitutional:      General: She is not in acute distress.    Appearance: She is well-developed. She is not diaphoretic.  HENT:     Head: Normocephalic.     Comments: Hematoma to the occiput    Right Ear: External ear normal.     Left Ear: External ear normal.     Nose: Nose normal.     Mouth/Throat:     Mouth: Mucous membranes are moist.  Eyes:     General: No scleral icterus.    Conjunctiva/sclera: Conjunctivae normal.  Cardiovascular:     Rate and Rhythm: Normal rate and regular rhythm.     Heart sounds: Normal heart sounds. No murmur heard.   No friction rub. No gallop.  Pulmonary:     Effort: Pulmonary effort is normal. No respiratory distress.     Breath sounds: Normal breath sounds.  Abdominal:     General: Bowel sounds are normal. There is no distension.     Palpations: Abdomen is soft. There is no mass.     Tenderness: There is no abdominal tenderness. There is no guarding.  Musculoskeletal:     Cervical back: No tenderness.     Comments: Tiny abrasion to the R elbow. No active bleeding. FROM  Skin:    General: Skin is warm and dry.  Neurological:     Mental Status: She is alert and oriented to person, place, and time.  Psychiatric:        Behavior: Behavior normal.    ED Results / Procedures / Treatments   Labs (all labs ordered are listed, but only abnormal results are displayed) Labs Reviewed - No data to display  EKG None  Radiology No results found.  Procedures Procedures   Medications Ordered in ED Medications - No data to display  ED Course  I have reviewed the triage vital signs and the nursing notes.  Pertinent labs & imaging results that were available during my care of the patient were reviewed by me and considered in my medical decision making (see chart for details).    MDM Rules/Calculators/A&P                           80 year old female here with mechanical fall.  I ordered and reviewed images that  included CT head, C-spine, right elbow and right hip x-ray which showed no acute abnormalities.  Patient is at her baseline mental status.  She may go home with Tylenol for pain relief.  She appears otherwise appropriate for discharge at this time Final Clinical Impression(s) / ED Diagnoses Final diagnoses:  None    Rx / DC Orders ED Discharge Orders     None        Margarita Mail, PA-C 03/04/21 1536    Isla Pence, MD 03/07/21 [phone]

## 2021-03-29 ENCOUNTER — Encounter: Payer: Medicare Other | Admitting: Internal Medicine

## 2021-04-11 ENCOUNTER — Ambulatory Visit (INDEPENDENT_AMBULATORY_CARE_PROVIDER_SITE_OTHER): Payer: Medicare Other

## 2021-04-11 ENCOUNTER — Encounter (HOSPITAL_COMMUNITY): Payer: Self-pay | Admitting: Hematology

## 2021-04-11 DIAGNOSIS — I442 Atrioventricular block, complete: Secondary | ICD-10-CM | POA: Diagnosis not present

## 2021-04-11 LAB — CUP PACEART REMOTE DEVICE CHECK
Battery Remaining Longevity: 47 mo
Battery Remaining Percentage: 46 %
Battery Voltage: 2.96 V
Brady Statistic AP VP Percent: 70 %
Brady Statistic AP VS Percent: 1 %
Brady Statistic AS VP Percent: 29 %
Brady Statistic AS VS Percent: 1 %
Brady Statistic RA Percent Paced: 69 %
Brady Statistic RV Percent Paced: 99 %
Date Time Interrogation Session: 20221107022450
Implantable Lead Implant Date: 20171020
Implantable Lead Implant Date: 20171020
Implantable Lead Location: 753859
Implantable Lead Location: 753860
Implantable Pulse Generator Implant Date: 20171020
Lead Channel Impedance Value: 400 Ohm
Lead Channel Impedance Value: 410 Ohm
Lead Channel Pacing Threshold Amplitude: 0.75 V
Lead Channel Pacing Threshold Amplitude: 1 V
Lead Channel Pacing Threshold Pulse Width: 0.4 ms
Lead Channel Pacing Threshold Pulse Width: 0.4 ms
Lead Channel Sensing Intrinsic Amplitude: 1.7 mV
Lead Channel Sensing Intrinsic Amplitude: 7.2 mV
Lead Channel Setting Pacing Amplitude: 2 V
Lead Channel Setting Pacing Amplitude: 2.5 V
Lead Channel Setting Pacing Pulse Width: 0.4 ms
Lead Channel Setting Sensing Sensitivity: 3.5 mV
Pulse Gen Model: 2272
Pulse Gen Serial Number: 3180497

## 2021-04-13 ENCOUNTER — Encounter (HOSPITAL_COMMUNITY): Payer: Self-pay | Admitting: Hematology

## 2021-04-18 NOTE — Progress Notes (Signed)
Remote pacemaker transmission.   

## 2021-05-09 ENCOUNTER — Encounter (HOSPITAL_COMMUNITY): Payer: Self-pay | Admitting: Hematology

## 2021-07-11 ENCOUNTER — Encounter (HOSPITAL_COMMUNITY): Payer: Self-pay | Admitting: Hematology

## 2021-07-11 ENCOUNTER — Ambulatory Visit (INDEPENDENT_AMBULATORY_CARE_PROVIDER_SITE_OTHER): Payer: Medicare Other

## 2021-07-11 DIAGNOSIS — I442 Atrioventricular block, complete: Secondary | ICD-10-CM

## 2021-07-12 LAB — CUP PACEART REMOTE DEVICE CHECK
Battery Remaining Longevity: 43 mo
Battery Remaining Percentage: 43 %
Battery Voltage: 2.96 V
Brady Statistic AP VP Percent: 71 %
Brady Statistic AP VS Percent: 1 %
Brady Statistic AS VP Percent: 28 %
Brady Statistic AS VS Percent: 1 %
Brady Statistic RA Percent Paced: 70 %
Brady Statistic RV Percent Paced: 99 %
Date Time Interrogation Session: 20230206020014
Implantable Lead Implant Date: 20171020
Implantable Lead Implant Date: 20171020
Implantable Lead Location: 753859
Implantable Lead Location: 753860
Implantable Pulse Generator Implant Date: 20171020
Lead Channel Impedance Value: 380 Ohm
Lead Channel Impedance Value: 410 Ohm
Lead Channel Pacing Threshold Amplitude: 0.75 V
Lead Channel Pacing Threshold Amplitude: 1 V
Lead Channel Pacing Threshold Pulse Width: 0.4 ms
Lead Channel Pacing Threshold Pulse Width: 0.4 ms
Lead Channel Sensing Intrinsic Amplitude: 2 mV
Lead Channel Sensing Intrinsic Amplitude: 4 mV
Lead Channel Setting Pacing Amplitude: 2 V
Lead Channel Setting Pacing Amplitude: 2.5 V
Lead Channel Setting Pacing Pulse Width: 0.4 ms
Lead Channel Setting Sensing Sensitivity: 3.5 mV
Pulse Gen Model: 2272
Pulse Gen Serial Number: 3180497

## 2021-07-14 NOTE — Progress Notes (Signed)
Remote pacemaker transmission.   

## 2021-07-26 ENCOUNTER — Encounter (HOSPITAL_COMMUNITY): Payer: Self-pay | Admitting: Hematology

## 2021-07-31 ENCOUNTER — Emergency Department (HOSPITAL_COMMUNITY): Payer: Medicare Other

## 2021-07-31 ENCOUNTER — Encounter (HOSPITAL_COMMUNITY): Payer: Self-pay

## 2021-07-31 ENCOUNTER — Inpatient Hospital Stay (HOSPITAL_COMMUNITY)
Admission: EM | Admit: 2021-07-31 | Discharge: 2021-08-04 | DRG: 481 | Disposition: A | Discharge status: Skilled Nursing Facility | Payer: Medicare Other | Attending: Internal Medicine | Admitting: Internal Medicine

## 2021-07-31 ENCOUNTER — Other Ambulatory Visit: Payer: Self-pay

## 2021-07-31 DIAGNOSIS — M6281 Muscle weakness (generalized): Secondary | ICD-10-CM | POA: Diagnosis not present

## 2021-07-31 DIAGNOSIS — Z886 Allergy status to analgesic agent status: Secondary | ICD-10-CM | POA: Diagnosis not present

## 2021-07-31 DIAGNOSIS — R279 Unspecified lack of coordination: Secondary | ICD-10-CM | POA: Diagnosis not present

## 2021-07-31 DIAGNOSIS — N189 Chronic kidney disease, unspecified: Secondary | ICD-10-CM | POA: Diagnosis not present

## 2021-07-31 DIAGNOSIS — J449 Chronic obstructive pulmonary disease, unspecified: Secondary | ICD-10-CM | POA: Diagnosis not present

## 2021-07-31 DIAGNOSIS — I739 Peripheral vascular disease, unspecified: Secondary | ICD-10-CM | POA: Diagnosis not present

## 2021-07-31 DIAGNOSIS — Z20822 Contact with and (suspected) exposure to covid-19: Secondary | ICD-10-CM | POA: Diagnosis not present

## 2021-07-31 DIAGNOSIS — Z79899 Other long term (current) drug therapy: Secondary | ICD-10-CM

## 2021-07-31 DIAGNOSIS — Z950 Presence of cardiac pacemaker: Secondary | ICD-10-CM | POA: Diagnosis not present

## 2021-07-31 DIAGNOSIS — M545 Low back pain, unspecified: Secondary | ICD-10-CM | POA: Diagnosis not present

## 2021-07-31 DIAGNOSIS — M79606 Pain in leg, unspecified: Secondary | ICD-10-CM | POA: Diagnosis not present

## 2021-07-31 DIAGNOSIS — N179 Acute kidney failure, unspecified: Secondary | ICD-10-CM | POA: Diagnosis not present

## 2021-07-31 DIAGNOSIS — E782 Mixed hyperlipidemia: Secondary | ICD-10-CM | POA: Diagnosis not present

## 2021-07-31 DIAGNOSIS — Z66 Do not resuscitate: Secondary | ICD-10-CM | POA: Diagnosis present

## 2021-07-31 DIAGNOSIS — M25551 Pain in right hip: Secondary | ICD-10-CM | POA: Diagnosis not present

## 2021-07-31 DIAGNOSIS — Z8249 Family history of ischemic heart disease and other diseases of the circulatory system: Secondary | ICD-10-CM | POA: Diagnosis not present

## 2021-07-31 DIAGNOSIS — S72001S Fracture of unspecified part of neck of right femur, sequela: Secondary | ICD-10-CM

## 2021-07-31 DIAGNOSIS — Z833 Family history of diabetes mellitus: Secondary | ICD-10-CM

## 2021-07-31 DIAGNOSIS — Z888 Allergy status to other drugs, medicaments and biological substances status: Secondary | ICD-10-CM | POA: Diagnosis not present

## 2021-07-31 DIAGNOSIS — I2510 Atherosclerotic heart disease of native coronary artery without angina pectoris: Secondary | ICD-10-CM | POA: Diagnosis present

## 2021-07-31 DIAGNOSIS — R9431 Abnormal electrocardiogram [ECG] [EKG]: Secondary | ICD-10-CM

## 2021-07-31 DIAGNOSIS — W19XXXA Unspecified fall, initial encounter: Secondary | ICD-10-CM | POA: Diagnosis not present

## 2021-07-31 DIAGNOSIS — E785 Hyperlipidemia, unspecified: Secondary | ICD-10-CM | POA: Diagnosis not present

## 2021-07-31 DIAGNOSIS — Z743 Need for continuous supervision: Secondary | ICD-10-CM | POA: Diagnosis not present

## 2021-07-31 DIAGNOSIS — S72001A Fracture of unspecified part of neck of right femur, initial encounter for closed fracture: Secondary | ICD-10-CM | POA: Diagnosis not present

## 2021-07-31 DIAGNOSIS — D72829 Elevated white blood cell count, unspecified: Secondary | ICD-10-CM | POA: Diagnosis not present

## 2021-07-31 DIAGNOSIS — I129 Hypertensive chronic kidney disease with stage 1 through stage 4 chronic kidney disease, or unspecified chronic kidney disease: Secondary | ICD-10-CM | POA: Diagnosis not present

## 2021-07-31 DIAGNOSIS — I130 Hypertensive heart and chronic kidney disease with heart failure and stage 1 through stage 4 chronic kidney disease, or unspecified chronic kidney disease: Secondary | ICD-10-CM | POA: Diagnosis present

## 2021-07-31 DIAGNOSIS — Z880 Allergy status to penicillin: Secondary | ICD-10-CM

## 2021-07-31 DIAGNOSIS — S72009A Fracture of unspecified part of neck of unspecified femur, initial encounter for closed fracture: Secondary | ICD-10-CM

## 2021-07-31 DIAGNOSIS — F17210 Nicotine dependence, cigarettes, uncomplicated: Secondary | ICD-10-CM | POA: Diagnosis present

## 2021-07-31 DIAGNOSIS — Z885 Allergy status to narcotic agent status: Secondary | ICD-10-CM | POA: Diagnosis not present

## 2021-07-31 DIAGNOSIS — Z7401 Bed confinement status: Secondary | ICD-10-CM | POA: Diagnosis not present

## 2021-07-31 DIAGNOSIS — R2681 Unsteadiness on feet: Secondary | ICD-10-CM | POA: Diagnosis not present

## 2021-07-31 DIAGNOSIS — D649 Anemia, unspecified: Secondary | ICD-10-CM | POA: Diagnosis present

## 2021-07-31 DIAGNOSIS — Z8673 Personal history of transient ischemic attack (TIA), and cerebral infarction without residual deficits: Secondary | ICD-10-CM

## 2021-07-31 DIAGNOSIS — I509 Heart failure, unspecified: Secondary | ICD-10-CM | POA: Diagnosis not present

## 2021-07-31 DIAGNOSIS — W010XXA Fall on same level from slipping, tripping and stumbling without subsequent striking against object, initial encounter: Secondary | ICD-10-CM | POA: Diagnosis present

## 2021-07-31 DIAGNOSIS — I10 Essential (primary) hypertension: Secondary | ICD-10-CM | POA: Diagnosis present

## 2021-07-31 DIAGNOSIS — F0390 Unspecified dementia without behavioral disturbance: Secondary | ICD-10-CM | POA: Diagnosis present

## 2021-07-31 DIAGNOSIS — E119 Type 2 diabetes mellitus without complications: Secondary | ICD-10-CM | POA: Diagnosis not present

## 2021-07-31 DIAGNOSIS — N1832 Chronic kidney disease, stage 3b: Secondary | ICD-10-CM | POA: Diagnosis present

## 2021-07-31 DIAGNOSIS — R269 Unspecified abnormalities of gait and mobility: Secondary | ICD-10-CM | POA: Diagnosis not present

## 2021-07-31 DIAGNOSIS — F02818 Dementia in other diseases classified elsewhere, unspecified severity, with other behavioral disturbance: Secondary | ICD-10-CM | POA: Diagnosis not present

## 2021-07-31 DIAGNOSIS — M25559 Pain in unspecified hip: Secondary | ICD-10-CM | POA: Diagnosis not present

## 2021-07-31 DIAGNOSIS — I5032 Chronic diastolic (congestive) heart failure: Secondary | ICD-10-CM | POA: Diagnosis not present

## 2021-07-31 DIAGNOSIS — I5030 Unspecified diastolic (congestive) heart failure: Secondary | ICD-10-CM

## 2021-07-31 DIAGNOSIS — S72001D Fracture of unspecified part of neck of right femur, subsequent encounter for closed fracture with routine healing: Secondary | ICD-10-CM | POA: Diagnosis not present

## 2021-07-31 DIAGNOSIS — Y92009 Unspecified place in unspecified non-institutional (private) residence as the place of occurrence of the external cause: Secondary | ICD-10-CM

## 2021-07-31 DIAGNOSIS — K219 Gastro-esophageal reflux disease without esophagitis: Secondary | ICD-10-CM

## 2021-07-31 LAB — CBC WITH DIFFERENTIAL/PLATELET
Abs Immature Granulocytes: 0.06 10*3/uL (ref 0.00–0.07)
Basophils Absolute: 0 10*3/uL (ref 0.0–0.1)
Basophils Relative: 1 %
Eosinophils Absolute: 0.2 10*3/uL (ref 0.0–0.5)
Eosinophils Relative: 2 %
HCT: 32.7 % — ABNORMAL LOW (ref 36.0–46.0)
Hemoglobin: 10.9 g/dL — ABNORMAL LOW (ref 12.0–15.0)
Immature Granulocytes: 1 %
Lymphocytes Relative: 30 %
Lymphs Abs: 2.5 10*3/uL (ref 0.7–4.0)
MCH: 31 pg (ref 26.0–34.0)
MCHC: 33.3 g/dL (ref 30.0–36.0)
MCV: 92.9 fL (ref 80.0–100.0)
Monocytes Absolute: 0.6 10*3/uL (ref 0.1–1.0)
Monocytes Relative: 8 %
Neutro Abs: 4.7 10*3/uL (ref 1.7–7.7)
Neutrophils Relative %: 58 %
Platelets: 199 10*3/uL (ref 150–400)
RBC: 3.52 MIL/uL — ABNORMAL LOW (ref 3.87–5.11)
RDW: 13.4 % (ref 11.5–15.5)
WBC: 8.1 10*3/uL (ref 4.0–10.5)
nRBC: 0 % (ref 0.0–0.2)

## 2021-07-31 LAB — BASIC METABOLIC PANEL
Anion gap: 8 (ref 5–15)
BUN: 44 mg/dL — ABNORMAL HIGH (ref 8–23)
CO2: 27 mmol/L (ref 22–32)
Calcium: 9.5 mg/dL (ref 8.9–10.3)
Chloride: 104 mmol/L (ref 98–111)
Creatinine, Ser: 2.19 mg/dL — ABNORMAL HIGH (ref 0.44–1.00)
GFR, Estimated: 22 mL/min — ABNORMAL LOW (ref >60)
Glucose, Bld: 125 mg/dL — ABNORMAL HIGH (ref 70–99)
Potassium: 4.5 mmol/L (ref 3.5–5.1)
Sodium: 139 mmol/L (ref 135–145)

## 2021-07-31 LAB — RESP PANEL BY RT-PCR (FLU A&B, COVID) ARPGX2
Influenza A by PCR: NEGATIVE
Influenza B by PCR: NEGATIVE
SARS Coronavirus 2 by RT PCR: NEGATIVE

## 2021-07-31 LAB — TYPE AND SCREEN
ABO/RH(D): O POS
Antibody Screen: NEGATIVE

## 2021-07-31 LAB — PROTIME-INR
INR: 1.1 (ref 0.8–1.2)
Prothrombin Time: 13.7 seconds (ref 11.4–15.2)

## 2021-07-31 MED ORDER — ONDANSETRON HCL 4 MG/2ML IJ SOLN
4.0000 mg | Freq: Once | INTRAMUSCULAR | Status: AC
  Administered 2021-07-31: 4 mg via INTRAVENOUS
  Filled 2021-07-31: qty 2

## 2021-07-31 MED ORDER — FENTANYL CITRATE PF 50 MCG/ML IJ SOSY
50.0000 ug | PREFILLED_SYRINGE | Freq: Once | INTRAMUSCULAR | Status: AC
  Administered 2021-07-31: 50 ug via INTRAVENOUS
  Filled 2021-07-31: qty 1

## 2021-07-31 MED ORDER — SODIUM CHLORIDE 0.9 % IV SOLN: INTRAVENOUS | Status: DC

## 2021-07-31 NOTE — H&P (Signed)
History and Physical    Patient: Charlotte Henry ZHY:[id number] DOB: [birth year] DOA: 07/31/2021 DOS: the patient was seen and examined on 08/01/2021 PCP: Patient, No Pcp Per (Inactive)  Patient coming from: Home  Chief Complaint:  Chief Complaint  Patient presents with   Fall    HPI: Charlotte Henry is a 81 y.o. female with medical history significant of  hypertension, peripheral vascular disease, CKD 3, COPD, pacemaker status who presented to the ED with complaints of right hip pain due to a fall sustained at home.  Patient states she was walking towards her bathroom from the bedroom with her walker when she lost balance and fell landing on her right side.  She complained of right hip pain with difficulty in being able to bear weight on right leg. There was no loss of consciousness or head injury EMS was activated and patient was taken to the ED for further evaluation and management.  ED course: In the emergency department, she was hemodynamically stable, BP was 144/58.  Lab work-up showed normocytic anemia, BUN/creatinine 44/2.19 (baseline creatinine at 1.1-1.5).  Influenza A, B, SARS coronavirus 2 was negative. Right hip x-ray showed right femoral neck fracture Patient was treated with IV fentanyl. Orthopedic surgeon (Dr. Amedeo Kinsman) was consulted and recommended admitting patient with plan to see patient in the morning per ED physician.  Hospitalist was asked to admit patient for further evaluation and management.   Review of Systems: As mentioned in the history of present illness. All other systems reviewed and are negative. Past Medical History:  Diagnosis Date   Acute lower UTI 11/15/2020   Arterial fibromuscular dysplasia (HCC)    ASCVD (arteriosclerotic cardiovascular disease)    a. cath in 4/04,-80%LAD; 70% RCA, -> DESx2; residual 60% distal RCA; nl EF  b. Nuc 02/2016 aborted due to bradycardia. Will need to be rescheduled    Carotid artery occlusion    Cerebrovascular disease     CHB (complete heart block) ([place])    a. s/p PPM on 03/24/16 with a St. Jude (serial number [id number]) pacemaker   Chronic kidney disease (CKD), stage III (moderate) (HCC)    Clostridium difficile colitis 03/2013   a. 03/2013   COPD (chronic obstructive pulmonary disease) ([place])    Coronary disease    Diverticulosis    GERD (gastroesophageal reflux disease)    Gout    H pylori ulcer    Hyperlipidemia    chose to stop statin, takes Zetia   Hypertension    not currently on medication   IBS (irritable bowel syndrome)    PVD (peripheral vascular disease) (HCC)    a. moderate to severely decreased ABI's in 8/08, sig. aortic inflow disease b. repeat ABI's in 2011 improved- mild disease on the left and moderate to severe disease on the right   S/P placement of cardiac pacemaker    a. 03/24/16: St. Jude (serial number [id number]) pacemaker   Symptomatic anemia 08/15/2016   TIA (transient ischemic attack) 2012   Tobacco abuse    a. 50 pack years continuing at 1/2 pack per day   Tobacco use disorder 10/15/2015   Past Surgical History:  Procedure Laterality Date   ABDOMINAL HYSTERECTOMY     carpel tunnel release     bilateral   CHOLECYSTECTOMY     COLONOSCOPY  2009   COLONOSCOPY N/A 08/04/2016   incomplete due to prep.    COLONOSCOPY N/A 08/05/2016   Dr. Oneida Alar: moderately redundant rectosigmoid and sigmoid, normal TI, single large-mouthed diverticulum in  hepatic flexure, external hemorrhoids, path negative for microscopic colitis    ENTEROSCOPY N/A 08/18/2016   Procedure: ENTEROSCOPY;  Surgeon: Daneil Dolin, MD;  Location[facility];  Service: Endoscopy;  Laterality: N/A;  Pediatric colonoscope   EP IMPLANTABLE DEVICE N/A 03/24/2016   Procedure: Pacemaker Implant;  Surgeon: [name], MD;  Location: [place] [facility] LAB;  Service: Cardiovascular;  Laterality: N/A;   ESOPHAGOGASTRODUODENOSCOPY N/A 08/04/2016   Dr. Oneida Alar: non-critical Schatzki's ring, small hiatal hernia, +H.pylori gastritis  on path.    GIVENS CAPSULE STUDY N/A 08/16/2016   Procedure: GIVENS CAPSULE STUDY;  Surgeon: Daneil Dolin, MD;  Location: [facility];  Service: Endoscopy;  Laterality: N/A;   TOTAL ABDOMINAL HYSTERECTOMY W/ BILATERAL SALPINGOOPHORECTOMY  1979   Social History:  reports that she has been smoking cigarettes. She started smoking about 55 years ago. She has a 50.00 pack-year smoking history. She has never used smokeless tobacco. She reports that she does not drink alcohol and does not use drugs.  Allergies  Allergen Reactions   Ace Inhibitors Hives, Shortness Of Breath and Swelling   Aspirin Other (See Comments)    Blood in stool and nose bleed   Codeine Swelling   Pineapple Shortness Of Breath and Swelling   Plasticized Base [Plastibase] Hives and Swelling    No plastics   Ultram [Tramadol] Swelling   Adhesive [Tape] Swelling and Rash   Naprosyn [Naproxen] Swelling   Biaxin [Clarithromycin] Nausea And Vomiting and Other (See Comments)    Dizziness.   Clindamycin/Lincomycin Other (See Comments)    Burning sensation throat, abdomen   Linzess [Linaclotide]     DIARRHEA, FECAL INCONTINENCE   Metronidazole     NAUSEA AND WEAKNESS   Nsaids    Penicillins     Lip swelling, Hives Has patient had a PCN reaction causing immediate rash, facial/tongue/throat swelling, SOB or lightheadedness with hypotension:YES Has patient had a PCN reaction causing severe rash involving mucus membranes or skin necrosis: NO Has patient had a PCN reaction that required hospitalization NO Has patient had a PCN reaction occurring within the last 10 years: NO If all of the above answers are "NO", then may proceed with Cephalosporin use.   Varenicline Tartrate Swelling   Xarelto [Rivaroxaban]     Bleeding    Family History  Problem Relation Age of Onset   Liver disease Mother 4   Hypertension Mother    Cerebral aneurysm Father    Aneurysm Father 61   Cancer Sister    Liver disease Brother    Liver  disease Brother    Liver disease Brother    Diabetes type II Brother    Alcohol abuse Brother    Colon cancer Neg Hx     Prior to Admission medications   Medication Sig Start Date End Date Taking? Authorizing Provider  acetaminophen (TYLENOL) 325 MG tablet Take 2 tablets (650 mg total) by mouth every 6 (six) hours as needed for mild pain (or Fever >/= 101). 11/18/20   Roxan Hockey, MD  albuterol (VENTOLIN HFA) 108 (90 Base) MCG/ACT inhaler Inhale 2 puffs into the lungs every 4 (four) hours as needed for wheezing or shortness of breath. 12/02/20   Gerlene Fee, NP  calcium citrate-vitamin D (CITRACAL+D) 315-200 MG-UNIT per tablet Take 1 tablet by mouth daily.    [provider]  clotrimazole (LOTRIMIN) 1 % cream Apply 1 application topically 2 (two) times daily. Beneath stomach 12/02/20   Gerlene Fee, NP  Cyanocobalamin (VITAMIN B 12 PO) Take 250 mcg by mouth daily.    [provider]  donepezil (ARICEPT) 5 MG tablet Take 1 tablet (5 mg total) by mouth at bedtime. 12/02/20   Gerlene Fee, NP  escitalopram (LEXAPRO) 10 MG tablet Take 1 tablet (10 mg total) by mouth daily. 12/02/20   Gerlene Fee, NP  ezetimibe (ZETIA) 10 MG tablet Take 1 tablet (10 mg total) by mouth daily. 12/02/20   Gerlene Fee, NP  ferrous sulfate 325 (65 FE) MG tablet Take 1 tablet (325 mg total) by mouth daily with breakfast. 12/02/20   Gerlene Fee, NP  furosemide (LASIX) 20 MG tablet Take 1 tablet (20 mg total) by mouth daily. 12/02/20   Gerlene Fee, NP  haloperidol (HALDOL) 2 MG tablet Take 2 mg by mouth every morning. 02/22/21   [provider]  haloperidol (HALDOL) 5 MG tablet Take 1 tablet by mouth every evening. 02/22/21   [provider]  Lidocaine 4 % PTCH Apply topically in the morning and at bedtime. apply to skin in AM and remove in PM    [provider]  Multiple Vitamin (MULTIVITAMIN WITH MINERALS) TABS tablet Take 1 tablet by mouth daily.  08/05/16   Johnson, Clanford L, MD  nicotine (NICODERM CQ - DOSED IN MG/24 HOURS) 14 mg/24hr patch Place 14 mg onto the skin daily. Patient not taking: Reported on 03/04/2021    [provider]  nystatin (MYCOSTATIN/NYSTOP) powder Apply 1 application topically 3 (three) times daily. Beneath breast and stomach 05/19/20   Alycia Rossetti, MD  omeprazole (PRILOSEC) 20 MG capsule Take 20 mg by mouth daily.    [provider]  ondansetron (ZOFRAN) 4 MG tablet Take 1 tablet (4 mg total) by mouth every 6 (six) hours as needed for nausea. Patient not taking: Reported on 03/04/2021 12/02/20   Gerlene Fee, NP    Physical Exam: Vitals:   07/31/21 2057 07/31/21 2308 07/31/21 2327 07/31/21 2358  BP: 135/75  (!) 147/62 (!) 145/63  Pulse: (!) 59  (!) 59 64  Resp: 18  16 18   Temp:   98.6 F (37 C) 98.4 F (36.9 C)  TempSrc:    Oral  SpO2: 98% 94% 97% 94%  Weight:    58.5 kg  Height:        General: Elderly female. Awake and alert and oriented x3. Not in any acute distress.  HEENT: NCAT.  PERRLA. EOMI. Sclerae anicteric.  Moist mucosal membranes. Neck: Neck supple without lymphadenopathy. No carotid bruits. No masses palpated.  Cardiovascular: Regular rate with normal S1-S2 sounds. No murmurs, rubs or gallops auscultated. No JVD.  Respiratory: Clear breath sounds.  No accessory muscle use. Abdomen: Soft, nontender, nondistended. Active bowel sounds. No masses or hepatosplenomegaly  Skin: No rashes, lesions, or ulcerations.  Dry, warm to touch. Musculoskeletal: Tender to palpation of right hip.  Restricted range of motion due to right hip pain.  2+ dorsalis pedis and radial pulses.  Psychiatric: Mood appropriate to current condition. Neurologic: No focal neurological deficits. Strength is 5/5 x 4.  CN II - XII grossly intact.   Data Reviewed: EKG personally reviewed showed AV dual paced paced rhythm at a rate of 64 bpm with QTc 511 ms  Assessment and Plan: * Closed displaced  fracture of right femoral neck ([place])- (present on admission) Right hip x-ray showed right femoral neck fracture Patient had a fall at home Patient will be placed on n.p.o. in  aspirin for surgical intervention in the morning Continue IV fentanyl as needed Continue fall precaution neurochecks Consider PT/OT eval and treat status post surgical repair of femoral neck fracture; patient is currently on bedrest Orthopedic surgeon was already consulted and recommended admitting patient with plan to see patient in the morning per ED physician  Acute kidney injury superimposed on chronic kidney disease ([place]) BUN/creatinine 44/2.19 (baseline creatinine at 1.1-1.5). Continue IV hydration Renally adjust medications, avoid nephrotoxic agents/dehydration/hypotension   Prolonged QT interval QTc 511 ms, patient has a paced rhythm Optimize electrolytes  Dementia without behavioral disturbance (HCC)  Aricept will be temporarily held due to prolonged QTc  (HFpEF) heart failure with preserved ejection fraction [facility]) Patient has history of chronic diastolic dysfunction Echo done on 11/18/2020 showed LV EF of 65 to 70% with grade 1 diastolic dysfunction Patient currently appears compensated/stable  GERD (gastroesophageal reflux disease) Continue Protonix  Hyperlipidemia- (present on admission) Continue Zetia   Advance Care Planning:   Code Status: DNR   Consults: Orthopedic surgery  Family Communication: None at bedside  Severity of Illness: The appropriate patient status for this patient is INPATIENT. Inpatient status is judged to be reasonable and necessary in order to provide the required intensity of service to ensure the patient's safety. The patient's presenting symptoms, physical exam findings, and initial radiographic and laboratory data in the context of their chronic comorbidities is felt to place them at high risk for further clinical deterioration. Furthermore, it is not anticipated that  the patient will be medically stable for discharge from the hospital within 2 midnights of admission.   * I certify that at the point of admission it is my clinical judgment that the patient will require inpatient hospital care spanning beyond 2 midnights from the point of admission due to high intensity of service, high risk for further deterioration and high frequency of surveillance required.*  Author: Bernadette Hoit, DO 08/01/2021 1:53 AM  For on call review [email or web address].

## 2021-07-31 NOTE — ED Provider Notes (Signed)
[facility] EMERGENCY DEPARTMENT Provider Note   CSN: [id number] Arrival date & time: 07/31/21  1943     History  Chief Complaint  Patient presents with   Charlotte Henry is a 81 y.o. female.   Fall Pertinent negatives include no chest pain and no shortness of breath.   Patient presents to the emergency room for evaluation after mechanical fall.  Patient was using her walker and ended up falling onto her right side.  Family denies any loss of consciousness or head injury.  Patient's had severe pain in her right hip since.  Not able to extend her leg fully.  No abdominal pain.  She does have some pain in her lower back as well  Home Medications Prior to Admission medications   Medication Sig Start Date End Date Taking? Authorizing Provider  acetaminophen (TYLENOL) 325 MG tablet Take 2 tablets (650 mg total) by mouth every 6 (six) hours as needed for mild pain (or Fever >/= 101). 11/18/20   Roxan Hockey, MD  albuterol (VENTOLIN HFA) 108 (90 Base) MCG/ACT inhaler Inhale 2 puffs into the lungs every 4 (four) hours as needed for wheezing or shortness of breath. 12/02/20   Gerlene Fee, NP  calcium citrate-vitamin D (CITRACAL+D) 315-200 MG-UNIT per tablet Take 1 tablet by mouth daily.    [provider]  clotrimazole (LOTRIMIN) 1 % cream Apply 1 application topically 2 (two) times daily. Beneath stomach 12/02/20   Gerlene Fee, NP  Cyanocobalamin (VITAMIN B 12 PO) Take 250 mcg by mouth daily.    [provider]  donepezil (ARICEPT) 5 MG tablet Take 1 tablet (5 mg total) by mouth at bedtime. 12/02/20   Gerlene Fee, NP  escitalopram (LEXAPRO) 10 MG tablet Take 1 tablet (10 mg total) by mouth daily. 12/02/20   Gerlene Fee, NP  ezetimibe (ZETIA) 10 MG tablet Take 1 tablet (10 mg total) by mouth daily. 12/02/20   Gerlene Fee, NP  ferrous sulfate 325 (65 FE) MG tablet Take 1 tablet (325 mg total) by mouth daily with breakfast. 12/02/20   Gerlene Fee, NP  furosemide (LASIX) 20 MG tablet Take 1 tablet (20 mg total) by mouth daily. 12/02/20   Gerlene Fee, NP  haloperidol (HALDOL) 2 MG tablet Take 2 mg by mouth every morning. 02/22/21   [provider]  haloperidol (HALDOL) 5 MG tablet Take 1 tablet by mouth every evening. 02/22/21   [provider]  Lidocaine 4 % PTCH Apply topically in the morning and at bedtime. apply to skin in AM and remove in PM    [provider]  Multiple Vitamin (MULTIVITAMIN WITH MINERALS) TABS tablet Take 1 tablet by mouth daily. 08/05/16   Johnson, Clanford L, MD  nicotine (NICODERM CQ - DOSED IN MG/24 HOURS) 14 mg/24hr patch Place 14 mg onto the skin daily. Patient not taking: Reported on 03/04/2021    [provider]  nystatin (MYCOSTATIN/NYSTOP) powder Apply 1 application topically 3 (three) times daily. Beneath breast and stomach 05/19/20   Alycia Rossetti, MD  omeprazole (PRILOSEC) 20 MG capsule Take 20 mg by mouth daily.    [provider]  ondansetron (ZOFRAN) 4 MG tablet Take 1 tablet (4 mg total) by mouth every 6 (six) hours as needed for nausea. Patient not taking: Reported on 03/04/2021 12/02/20   Gerlene Fee, NP      Allergies    Ace inhibitors, Aspirin, Codeine, Pineapple, Plasticized  base [plastibase], Ultram [tramadol], Adhesive [tape], Naprosyn [naproxen], Biaxin [clarithromycin], Clindamycin/lincomycin, Linzess [linaclotide], Metronidazole, Nsaids, Penicillins, Varenicline tartrate, and Xarelto [rivaroxaban]    Review of Systems   Review of Systems  Constitutional:  Negative for fever.  Respiratory:  Negative for shortness of breath.   Cardiovascular:  Negative for chest pain.   Physical Exam Updated Vital Signs BP 135/75 (BP Location: Right Arm)    Pulse (!) 59    Temp 98.8 F (37.1 C) (Oral)    Resp 18    Ht 1.702 m (5\' 7" )    Wt 65 kg    SpO2 98%    BMI 22.44 kg/m  Physical Exam Vitals and nursing note reviewed.  Constitutional:       General: She is not in acute distress.    Appearance: She is well-developed.  HENT:     Head: Normocephalic and atraumatic.     Right Ear: External ear normal.     Left Ear: External ear normal.  Eyes:     General: No scleral icterus.       Right eye: No discharge.        Left eye: No discharge.     Conjunctiva/sclera: Conjunctivae normal.  Neck:     Trachea: No tracheal deviation.  Cardiovascular:     Rate and Rhythm: Normal rate and regular rhythm.  Pulmonary:     Effort: Pulmonary effort is normal. No respiratory distress.     Breath sounds: Normal breath sounds. No stridor. No wheezing or rales.  Abdominal:     General: Bowel sounds are normal. There is no distension.     Palpations: Abdomen is soft.     Tenderness: There is no abdominal tenderness. There is no guarding or rebound.  Musculoskeletal:        General: No deformity.     Cervical back: Normal and neck supple.     Thoracic back: Normal.     Lumbar back: Tenderness present.     Right hip: Tenderness present. Decreased range of motion.  Skin:    General: Skin is warm and dry.     Findings: No rash.  Neurological:     General: No focal deficit present.     Mental Status: She is alert.     Cranial Nerves: No cranial nerve deficit (no facial droop, extraocular movements intact, no slurred speech).     Sensory: No sensory deficit.     Motor: No abnormal muscle tone or seizure activity.     Coordination: Coordination normal.  Psychiatric:        Mood and Affect: Mood normal.    ED Results / Procedures / Treatments   Labs (all labs ordered are listed, but only abnormal results are displayed) Labs Reviewed  BASIC METABOLIC PANEL - Abnormal; Notable for the following components:      Result Value   Glucose, Bld 125 (*)    BUN 44 (*)    Creatinine, Ser 2.19 (*)    GFR, Estimated 22 (*)    All other components within normal limits  CBC WITH DIFFERENTIAL/PLATELET - Abnormal; Notable for the following  components:   RBC 3.52 (*)    Hemoglobin 10.9 (*)    HCT 32.7 (*)    All other components within normal limits  PROTIME-INR  TYPE AND SCREEN    EKG EKG Interpretation  Date/Time:  Sunday July 31 2021 21:26:23 EST Ventricular Rate:  64 PR Interval:  186 QRS Duration: 162 QT Interval:  496 QTC  Calculation: 511 R Axis:   -72 Text Interpretation: AV dual-paced rhythm Abnormal ECG When compared with ECG of 15-Nov-2020 14:39, PREVIOUS ECG IS PRESENT Confirmed by Dorie Rank [phone]) on 07/31/2021 10:59:09 PM  Radiology DG Lumbar Spine Complete  Result Date: 07/31/2021 CLINICAL DATA:  Fall.  Back pain and right hip pain. EXAM: LUMBAR SPINE - COMPLETE 4+ VIEW COMPARISON:  CT 07/09/2018 FINDINGS: Study degraded by artifact on the lateral views. No evidence of fracture from the lower thoracic region to the sacrum. Chronic lower lumbar disc space narrowing with degenerative anterolisthesis at L4-5 of about 4 mm. Regional arterial calcification incidentally noted. IMPRESSION: No acute fracture seen. Lower lumbar degenerative changes. Lateral views suffer from overlying artifact. Electronically Signed   By: Nelson Chimes M.D.   On: 07/31/2021 22:25   DG Hip Unilat  With Pelvis 2-3 Views Right  Result Date: 07/31/2021 CLINICAL DATA:  [place] with hip pain EXAM: DG HIP (WITH OR WITHOUT PELVIS) 2-3V RIGHT COMPARISON:  03/04/2021 FINDINGS: Femoral neck fracture on the right. Bones of the pelvis appear unremarkable. Left hip appears normal. IMPRESSION: Right femoral neck fracture. Electronically Signed   By: Nelson Chimes M.D.   On: 07/31/2021 22:23    Procedures Procedures    Medications Ordered in ED Medications  0.9 %  sodium chloride infusion ( Intravenous New Bag/Given 07/31/21 2129)  fentaNYL (SUBLIMAZE) injection 50 mcg (50 mcg Intravenous Given 07/31/21 2123)  ondansetron (ZOFRAN) injection 4 mg (4 mg Intravenous Given 07/31/21 2121)  fentaNYL (SUBLIMAZE) injection 50 mcg (50 mcg Intravenous  Given 07/31/21 2234)    ED Course/ Medical Decision Making/ A&P Clinical Course as of 07/31/21 2300  Sun Jul 31, 2021  0211 Basic metabolic panel(!) Creatinine increased compared to previous [JK]  2227 CBC WITH DIFFERENTIAL(!) Anemia stable [JK]  2227 DG Hip Unilat  With Pelvis 2-3 Views Right Hip x-ray images and radiology report reviewed.  Patient has a femoral neck fracture [JK]  2256 Case discussed with Dr Amedeo Kinsman. [JK]  2259 Case discussed with Dr. Josephine Cables [JK]    Clinical Course User Index [JK] Dorie Rank, MD                           Medical Decision Making Amount and/or Complexity of Data Reviewed Labs: ordered. Decision-making details documented in ED Course. Radiology: ordered. Decision-making details documented in ED Course.  Risk Prescription drug management.   She presented to the ED for evaluation after mechanical fall.  No signs of head injury.  No focal neurologic deficits.  Patient had significant pain in her right hip and limited range of motion.  X-rays confirm of right femoral neck fracture.  Patient was treated with IV narcotic pain medications.  Case was discussed with Dr. Amedeo Kinsman and Dr Josephine Cables.  Plan on admission to the hospital for further treatment.        Final Clinical Impression(s) / ED Diagnoses Final diagnoses:  Closed fracture of neck of right femur, initial encounter ([place])  Chronic kidney disease, unspecified CKD stage     Dorie Rank, MD 07/31/21 2301

## 2021-07-31 NOTE — ED Triage Notes (Signed)
Pt c/o fall at home, states she landed on her right hip and may have hit her head. Denies taking blood thinners. Pt states it hurts to extend her right leg.

## 2021-08-01 ENCOUNTER — Encounter (HOSPITAL_COMMUNITY): Payer: Self-pay | Admitting: Internal Medicine

## 2021-08-01 DIAGNOSIS — I5030 Unspecified diastolic (congestive) heart failure: Secondary | ICD-10-CM

## 2021-08-01 DIAGNOSIS — R9431 Abnormal electrocardiogram [ECG] [EKG]: Secondary | ICD-10-CM

## 2021-08-01 DIAGNOSIS — W19XXXA Unspecified fall, initial encounter: Secondary | ICD-10-CM

## 2021-08-01 DIAGNOSIS — N189 Chronic kidney disease, unspecified: Secondary | ICD-10-CM

## 2021-08-01 DIAGNOSIS — N179 Acute kidney failure, unspecified: Secondary | ICD-10-CM

## 2021-08-01 DIAGNOSIS — F0390 Unspecified dementia without behavioral disturbance: Secondary | ICD-10-CM

## 2021-08-01 DIAGNOSIS — S72001A Fracture of unspecified part of neck of right femur, initial encounter for closed fracture: Principal | ICD-10-CM

## 2021-08-01 LAB — COMPREHENSIVE METABOLIC PANEL
ALT: 12 U/L (ref 0–44)
AST: 21 U/L (ref 15–41)
Albumin: 3.7 g/dL (ref 3.5–5.0)
Alkaline Phosphatase: 45 U/L (ref 38–126)
Anion gap: 8 (ref 5–15)
BUN: 42 mg/dL — ABNORMAL HIGH (ref 8–23)
CO2: 24 mmol/L (ref 22–32)
Calcium: 9.1 mg/dL (ref 8.9–10.3)
Chloride: 107 mmol/L (ref 98–111)
Creatinine, Ser: 1.96 mg/dL — ABNORMAL HIGH (ref 0.44–1.00)
GFR, Estimated: 25 mL/min — ABNORMAL LOW (ref >60)
Glucose, Bld: 137 mg/dL — ABNORMAL HIGH (ref 70–99)
Potassium: 4.3 mmol/L (ref 3.5–5.1)
Sodium: 139 mmol/L (ref 135–145)
Total Bilirubin: 0.5 mg/dL (ref 0.3–1.2)
Total Protein: 6.7 g/dL (ref 6.5–8.1)

## 2021-08-01 LAB — CBC
HCT: 28.3 % — ABNORMAL LOW (ref 36.0–46.0)
Hemoglobin: 9.4 g/dL — ABNORMAL LOW (ref 12.0–15.0)
MCH: 30.9 pg (ref 26.0–34.0)
MCHC: 33.2 g/dL (ref 30.0–36.0)
MCV: 93.1 fL (ref 80.0–100.0)
Platelets: 177 10*3/uL (ref 150–400)
RBC: 3.04 MIL/uL — ABNORMAL LOW (ref 3.87–5.11)
RDW: 13.3 % (ref 11.5–15.5)
WBC: 12.2 10*3/uL — ABNORMAL HIGH (ref 4.0–10.5)
nRBC: 0 % (ref 0.0–0.2)

## 2021-08-01 LAB — URINALYSIS, ROUTINE W REFLEX MICROSCOPIC
Bilirubin Urine: NEGATIVE
Glucose, UA: NEGATIVE mg/dL
Hgb urine dipstick: NEGATIVE
Ketones, ur: NEGATIVE mg/dL
Leukocytes,Ua: NEGATIVE
Nitrite: NEGATIVE
Protein, ur: NEGATIVE mg/dL
Specific Gravity, Urine: 1.014 (ref 1.005–1.030)
pH: 5 (ref 5.0–8.0)

## 2021-08-01 LAB — SURGICAL PCR SCREEN
MRSA, PCR: NEGATIVE
Staphylococcus aureus: NEGATIVE

## 2021-08-01 LAB — PHOSPHORUS: Phosphorus: 3.2 mg/dL (ref 2.5–4.6)

## 2021-08-01 LAB — MAGNESIUM: Magnesium: 2.1 mg/dL (ref 1.7–2.4)

## 2021-08-01 MED ORDER — MUPIROCIN 2 % EX OINT
1.0000 "application " | TOPICAL_OINTMENT | Freq: Two times a day (BID) | CUTANEOUS | Status: DC
  Administered 2021-08-01 (×2): 1 via NASAL
  Filled 2021-08-01 (×2): qty 22

## 2021-08-01 MED ORDER — EZETIMIBE 10 MG PO TABS
10.0000 mg | ORAL_TABLET | Freq: Every day | ORAL | Status: DC
  Administered 2021-08-01 – 2021-08-04 (×4): 10 mg via ORAL
  Filled 2021-08-01 (×4): qty 1

## 2021-08-01 MED ORDER — PANTOPRAZOLE SODIUM 40 MG PO TBEC
40.0000 mg | DELAYED_RELEASE_TABLET | Freq: Every day | ORAL | Status: DC
  Administered 2021-08-01 – 2021-08-04 (×4): 40 mg via ORAL
  Filled 2021-08-01 (×4): qty 1

## 2021-08-01 MED ORDER — SENNOSIDES-DOCUSATE SODIUM 8.6-50 MG PO TABS
2.0000 | ORAL_TABLET | Freq: Two times a day (BID) | ORAL | Status: DC
  Administered 2021-08-01 – 2021-08-04 (×6): 2 via ORAL
  Filled 2021-08-01 (×6): qty 2

## 2021-08-01 MED ORDER — FENTANYL CITRATE PF 50 MCG/ML IJ SOSY
25.0000 ug | PREFILLED_SYRINGE | INTRAMUSCULAR | Status: DC | PRN
  Administered 2021-08-01 – 2021-08-04 (×12): 25 ug via INTRAVENOUS
  Filled 2021-08-01 (×12): qty 1

## 2021-08-01 MED ORDER — TRAZODONE HCL 50 MG PO TABS
50.0000 mg | ORAL_TABLET | Freq: Every day | ORAL | Status: DC
  Administered 2021-08-01 – 2021-08-03 (×3): 50 mg via ORAL
  Filled 2021-08-01 (×3): qty 1

## 2021-08-01 MED ORDER — OXYCODONE HCL 5 MG PO TABS
5.0000 mg | ORAL_TABLET | ORAL | Status: DC | PRN
Start: 2021-08-01 — End: 2021-08-04
  Administered 2021-08-02 – 2021-08-04 (×6): 5 mg via ORAL
  Filled 2021-08-01 (×6): qty 1

## 2021-08-01 NOTE — Consult Note (Addendum)
ORTHOPAEDIC CONSULTATION  REQUESTING PHYSICIAN: Roxan Hockey, MD  ASSESSMENT AND PLAN: 81 y.o. female with the following: Right Hip Valgus impacted femoral neck fracture  This patient requires inpatient admission to the hospitalist, to include preoperative clearance and perioperative medical management  - Weight Bearing Status/Activity: NWB Right lower extremity  - Additional recommended labs/tests: Preop Labs: CBC, BMP, PT/INR, Chest XR, and EKG  -VTE Prophylaxis: Please hold prior to OR; to resume POD#1 at the discretion of the primary team  - Pain control: Recommend PO pain medications PRN; judicious use of narcotics  - Follow-up plan: F/u 10-14 days postop  -Procedures: Plan for OR once patient has been medically optimized  Plan for Right Hip CRPP  Plan for OR 08/02/21; NPO at midnight    Chief Complaint: Right hip pain  HPI: Charlotte Henry is a 81 y.o. female who presented to the ED for evaluation after sustaining a mechanical fall.  Patient ambulates with a walker.  She lives with her adult children who provide constant care.  She has dementia.  She receives hospice care.  She requires help to get out of bed.  She received assistance yesterday in standing to a walker, but attempted to put on her housecoat without the walker and fell backwards.  She is complaining of right hip pain.  Pain is worse with movement.  Comfortable when not moving.   Past Medical History:  Diagnosis Date   Acute lower UTI 11/15/2020   Arterial fibromuscular dysplasia (HCC)    ASCVD (arteriosclerotic cardiovascular disease)    a. cath in 4/04,-80%LAD; 70% RCA, -> DESx2; residual 60% distal RCA; nl EF  b. Nuc 02/2016 aborted due to bradycardia. Will need to be rescheduled    Carotid artery occlusion    Cerebrovascular disease    CHB (complete heart block) ([place])    a. s/p PPM on 03/24/16 with a [employer] Jude (serial number [id number]) pacemaker   Chronic kidney disease (CKD), stage III (moderate)  (HCC)    Clostridium difficile colitis 03/2013   a. 03/2013   COPD (chronic obstructive pulmonary disease) ([place])    Coronary disease    Diverticulosis    GERD (gastroesophageal reflux disease)    Gout    H pylori ulcer    Hyperlipidemia    chose to stop statin, takes Zetia   Hypertension    not currently on medication   IBS (irritable bowel syndrome)    PVD (peripheral vascular disease) (HCC)    a. moderate to severely decreased ABI's in 8/08, sig. aortic inflow disease b. repeat ABI's in 2011 improved- mild disease on the left and moderate to severe disease on the right   S/P placement of cardiac pacemaker    a. 03/24/16: St. Jude (serial number [id number]) pacemaker   Symptomatic anemia 08/15/2016   TIA (transient ischemic attack) 2012   Tobacco abuse    a. 50 pack years continuing at 1/2 pack per day   Tobacco use disorder 10/15/2015   Past Surgical History:  Procedure Laterality Date   ABDOMINAL HYSTERECTOMY     carpel tunnel release     bilateral   CHOLECYSTECTOMY     COLONOSCOPY  2009   COLONOSCOPY N/A 08/04/2016   incomplete due to prep.    COLONOSCOPY N/A 08/05/2016   Dr. Oneida Alar: moderately redundant rectosigmoid and sigmoid, normal TI, single large-mouthed diverticulum in hepatic flexure, external hemorrhoids, path negative for microscopic colitis    ENTEROSCOPY N/A 08/18/2016   Procedure: ENTEROSCOPY;  Surgeon: Daneil Dolin,  MD;  Location: [facility];  Service: Endoscopy;  Laterality: N/A;  Pediatric colonoscope   EP IMPLANTABLE DEVICE N/A 03/24/2016   Procedure: Pacemaker Implant;  Surgeon: [name], MD;  Location: [place] [facility];  Service: Cardiovascular;  Laterality: N/A;   ESOPHAGOGASTRODUODENOSCOPY N/A 08/04/2016   Dr. Oneida Alar: non-critical Schatzki's ring, small hiatal hernia, +H.pylori gastritis on path.    GIVENS CAPSULE STUDY N/A 08/16/2016   Procedure: GIVENS CAPSULE STUDY;  Surgeon: Daneil Dolin, MD;  Location: [facility];  Service: Endoscopy;   Laterality: N/A;   TOTAL ABDOMINAL HYSTERECTOMY W/ BILATERAL SALPINGOOPHORECTOMY  1979   Social History   Socioeconomic History   Marital status: Widowed    Spouse name: Not on file   Number of children: Not on file   Years of education: Not on file   Highest education level: Not on file  Occupational History   Not on file  Tobacco Use   Smoking status: Every Day    Packs/day: 1.00    Years: 50.00    Pack years: 50.00    Types: Cigarettes    Start date: 02/02/1966   Smokeless tobacco: Never   Tobacco comments:    1/2 pack daily  Vaping Use   Vaping Use: Never used  Substance and Sexual Activity   Alcohol use: No    Alcohol/week: 0.0 standard drinks    Comment: H/O of 6 pack per day quiting in 2003   Drug use: No   Sexual activity: Not Currently  Other Topics Concern   Not on file  Social History Narrative   Not on file   Social Determinants of Health   Financial Resource Strain: Not on file  Food Insecurity: Not on file  Transportation Needs: Not on file  Physical Activity: Not on file  Stress: Not on file  Social Connections: Not on file   Family History  Problem Relation Age of Onset   Liver disease Mother 58   Hypertension Mother    Cerebral aneurysm Father    Aneurysm Father 75   Cancer Sister    Liver disease Brother    Liver disease Brother    Liver disease Brother    Diabetes type II Brother    Alcohol abuse Brother    Colon cancer Neg Hx    Allergies  Allergen Reactions   Ace Inhibitors Hives, Shortness Of Breath and Swelling   Aspirin Other (See Comments)    Blood in stool and nose bleed   Codeine Swelling   Pineapple Shortness Of Breath and Swelling   Plasticized Base [Plastibase] Hives and Swelling    No plastics   Ultram [Tramadol] Swelling   Adhesive [Tape] Swelling and Rash   Naprosyn [Naproxen] Swelling   Biaxin [Clarithromycin] Nausea And Vomiting and Other (See Comments)    Dizziness.   Clindamycin/Lincomycin Other (See  Comments)    Burning sensation throat, abdomen   Linzess [Linaclotide]     DIARRHEA, FECAL INCONTINENCE   Metronidazole     NAUSEA AND WEAKNESS   Nsaids    Penicillins     Lip swelling, Hives Has patient had a PCN reaction causing immediate rash, facial/tongue/throat swelling, SOB or lightheadedness with hypotension:YES Has patient had a PCN reaction causing severe rash involving mucus membranes or skin necrosis: NO Has patient had a PCN reaction that required hospitalization NO Has patient had a PCN reaction occurring within the last 10 years: NO If all of the above answers are "NO", then may proceed with  Cephalosporin use.   Varenicline Tartrate Swelling   Xarelto [Rivaroxaban]     Bleeding   Prior to Admission medications   Medication Sig Start Date End Date Taking? Authorizing Provider  acetaminophen (TYLENOL) 325 MG tablet Take 2 tablets (650 mg total) by mouth every 6 (six) hours as needed for mild pain (or Fever >/= 101). 11/18/20  Yes Emokpae, Courage, MD  albuterol (VENTOLIN HFA) 108 (90 Base) MCG/ACT inhaler Inhale 2 puffs into the lungs every 4 (four) hours as needed for wheezing or shortness of breath. 12/02/20  Yes Gerlene Fee, NP  calcium citrate-vitamin D (CITRACAL+D) 315-200 MG-UNIT per tablet Take 1 tablet by mouth daily.   Yes [provider]  Cyanocobalamin (VITAMIN B 12 PO) Take 250 mcg by mouth daily.   Yes [provider]  donepezil (ARICEPT) 5 MG tablet Take 1 tablet (5 mg total) by mouth at bedtime. 12/02/20  Yes Gerlene Fee, NP  escitalopram (LEXAPRO) 10 MG tablet Take 1 tablet (10 mg total) by mouth daily. 12/02/20  Yes Gerlene Fee, NP  ezetimibe (ZETIA) 10 MG tablet Take 1 tablet (10 mg total) by mouth daily. 12/02/20  Yes Gerlene Fee, NP  ferrous sulfate 325 (65 FE) MG tablet Take 1 tablet (325 mg total) by mouth daily with breakfast. 12/02/20  Yes Gerlene Fee, NP  furosemide (LASIX) 20 MG tablet Take 1 tablet (20 mg total)  by mouth daily. Patient taking differently: Take 20 mg by mouth daily as needed for fluid or edema. 12/02/20  Yes Gerlene Fee, NP  haloperidol (HALDOL) 2 MG tablet Take 2 mg by mouth every morning. 02/22/21  Yes [provider]  haloperidol (HALDOL) 5 MG tablet Take 1 tablet by mouth every evening. 02/22/21  Yes [provider]  Lidocaine 4 % PTCH Apply topically in the morning and at bedtime. apply to skin in AM and remove in PM   Yes [provider]  Multiple Vitamin (MULTIVITAMIN WITH MINERALS) TABS tablet Take 1 tablet by mouth daily. 08/05/16  Yes Johnson, Clanford L, MD  omeprazole (PRILOSEC) 20 MG capsule Take 20 mg by mouth daily.   Yes [provider]  clotrimazole (LOTRIMIN) 1 % cream Apply 1 application topically 2 (two) times daily. Beneath stomach Patient not taking: Reported on 08/01/2021 12/02/20   Gerlene Fee, NP  nystatin (MYCOSTATIN/NYSTOP) powder Apply 1 application topically 3 (three) times daily. Beneath breast and stomach Patient not taking: Reported on 08/01/2021 05/19/20   Alycia Rossetti, MD  ondansetron (ZOFRAN) 4 MG tablet Take 1 tablet (4 mg total) by mouth every 6 (six) hours as needed for nausea. Patient not taking: Reported on 03/04/2021 12/02/20   Gerlene Fee, NP   DG Lumbar Spine Complete  Result Date: 07/31/2021 CLINICAL DATA:  Fall.  Back pain and right hip pain. EXAM: LUMBAR SPINE - COMPLETE 4+ VIEW COMPARISON:  CT 07/09/2018 FINDINGS: Study degraded by artifact on the lateral views. No evidence of fracture from the lower thoracic region to the sacrum. Chronic lower lumbar disc space narrowing with degenerative anterolisthesis at L4-5 of about 4 mm. Regional arterial calcification incidentally noted. IMPRESSION: No acute fracture seen. Lower lumbar degenerative changes. Lateral views suffer from overlying artifact. Electronically Signed   By: Nelson Chimes M.D.   On: 07/31/2021 22:25   DG Hip Unilat  With Pelvis 2-3 Views  Right  Result Date: 07/31/2021 CLINICAL DATA:  [place] with hip pain EXAM: DG HIP (WITH OR WITHOUT PELVIS)  2-3V RIGHT COMPARISON:  03/04/2021 FINDINGS: Femoral neck fracture on the right. Bones of the pelvis appear unremarkable. Left hip appears normal. IMPRESSION: Right femoral neck fracture. Electronically Signed   By: Nelson Chimes M.D.   On: 07/31/2021 22:23     Family History Reviewed and non-contributory, no pertinent history of problems with bleeding or anesthesia    Review of Systems Unable to obtain due to patient's mental status    OBJECTIVE  Vitals:Patient Vitals for the past 8 hrs:  BP Temp Temp src Pulse Resp SpO2  08/01/21 0603 (!) 114/58 99 F (37.2 C) Oral (!) 59 18 93 %  08/01/21 0200 112/65 98.9 F (37.2 C) Oral (!) 59 18 94 %   General: no acute distress Cardiovascular: Extremities are warm Respiratory: No cyanosis, no use of accessory musculature Skin: No lesions in the area of chief complaint  Neurologic: Responds to light touch, spontaneous movement  Psychiatric: Patient has dementia and does not respond to questions appropriately Lymphatic: No swelling obvious and reported other than the area involved in the exam below Extremities  RLE: Extremity held in a fixed position.  ROM deferred due to known fracture. 1+ DP pulse.  Toes are WWP.  Active motion intact in the TA/EHL/GS; spontaneous motion LLE: 1+ DP pulse.  Toes are WWP.  Active motion intact in the TA/EHL/GS; spontaneous motion.  Toes are WWP.      Test Results Imaging XR of the Right Hip demonstrates a Valgus impacted femoral neck fracture.  Labs cbc Recent Labs    07/31/21 2054 08/01/21 0523  WBC 8.1 12.2*  HGB 10.9* 9.4*  HCT 32.7* 28.3*  PLT 199 177    Labs inflam No results for input(s): CRP in the last 72 hours.  Invalid input(s): ESR  Labs coag Recent Labs    07/31/21 2054  INR 1.1    Recent Labs    07/31/21 2054 08/01/21 0523  NA 139 139  K 4.5 4.3  CL 104 107  CO2  27 24  GLUCOSE 125* 137*  BUN 44* 42*  CREATININE 2.19* 1.96*  CALCIUM 9.5 9.1

## 2021-08-01 NOTE — Anesthesia Preprocedure Evaluation (Addendum)
Anesthesia Evaluation  Patient identified by MRN, date of birth, ID band Patient awake    Reviewed: Allergy & Precautions, NPO status , Patient's Chart, lab work & pertinent test results  Airway Mallampati: III  TM Distance: >3 FB Neck ROM: Full    Dental  (+) Upper Dentures, Partial Lower   Pulmonary asthma , COPD,  COPD inhaler, Current Smoker and Patient abstained from smoking.,    Pulmonary exam normal breath sounds clear to auscultation       Cardiovascular Exercise Tolerance: Poor hypertension, Pt. on medications + CAD, + Cardiac Stents and + Peripheral Vascular Disease (AAA - 3.2 cm X2.9 cm, infrarenal in 2020)  + dysrhythmias + pacemaker (complete heart block)  Rhythm:Regular Rate:Normal + Systolic murmurs 1. Left ventricular ejection fraction, by estimation, is 65 to 70%. The left ventricle has hyperdynamic function. The left ventricle has no regional wall motion abnormalities. There is mild left ventricular hypertrophy. Left ventricular diastolic parameters are consistent with Grade I diastolic dysfunction (impaired relaxation).  2. Right ventricular systolic function is normal. The right ventricular size is normal. There is normal pulmonary artery systolic pressure.  3. Left atrial size was mildly dilated.  4. The mitral valve is normal in structure. No evidence of mitral valve regurgitation. No evidence of mitral stenosis.  5. The aortic valve is tricuspid. There is moderate calcification of the aortic valve. There is moderate thickening of the aortic valve. Aortic valve regurgitation is not visualized. No aortic stenosis is present.  6. The inferior vena cava is normal in size with greater than 50% respiratory variability, suggesting right atrial pressure of 3 mmHg.    Neuro/Psych PSYCHIATRIC DISORDERS Depression Dementia TIA (3 weeks ago) Neuromuscular disease    GI/Hepatic PUD, GERD  Medicated and Controlled,   Endo/Other  diabetes, Well Controlled, Type 2, Oral Hypoglycemic Agents  Renal/GU Renal InsufficiencyRenal disease     Musculoskeletal  (+) Arthritis  (gout),   Abdominal   Peds  Hematology  (+) Blood dyscrasia, anemia ,   Anesthesia Other Findings 31-Jul-2021 21:26:23 [facility]-ED ROUTINE RECORD Dec 10, 1940 (54 yr) Test ZRA:QTMAUQ for Exam->Other (See Comments) Vent. rate 64 BPM PR interval 186 ms QRS duration 162 ms QT/QTcB 496/511 ms P-R-T axes * -72 79 AV dual-paced rhythm Abnormal ECG When compared with ECG of 15-Nov-2020 14:39, PREVIOUS ECG IS PRESENT Confirmed by Dorie Rank [phone]) on 07/31/2021 10:59:09 PM  Reproductive/Obstetrics negative OB ROS                            Anesthesia Physical Anesthesia Plan  ASA: 4  Anesthesia Plan: General/Spinal   Post-op Pain Management: Minimal or no pain anticipated   Induction:   PONV Risk Score and Plan: 3 and Ondansetron  Airway Management Planned: Nasal Cannula, Natural Airway and Simple Face Mask  Additional Equipment:   Intra-op Plan:   Post-operative Plan:   Informed Consent: I have reviewed the patients History and Physical, chart, labs and discussed the procedure including the risks, benefits and alternatives for the proposed anesthesia with the patient or authorized representative who has indicated his/her understanding and acceptance.    Discussed DNR with power of attorney and Suspend DNR.   Dental advisory given  Plan Discussed with: CRNA and Surgeon  Anesthesia Plan Comments: (Possible GA with airway was discussed. Will discussed with POA regarding DNR status.)      Anesthesia Quick Evaluation

## 2021-08-01 NOTE — Assessment & Plan Note (Addendum)
-  Continue PPI -Dose adjusted to twice a day for better GI protection while using aspirin BID for DVT prophylaxis.

## 2021-08-01 NOTE — Progress Notes (Signed)
PROGRESS NOTE     Charlotte Henry, is a 81 y.o. female, DOB - 28-Nov-1940, PFX:[id number]  Admit date - 07/31/2021   Admitting Physician Bernadette Hoit, DO  Outpatient Primary MD for the Charlotte Henry is Charlotte Henry, No Pcp Per (Inactive)  LOS - 1  Chief Complaint  Charlotte Henry presents with   Fall        Brief Narrative:    81 y.o. female with medical history significant of  hypertension, peripheral vascular disease, CKD 3, COPD, pacemaker status admitted on 07/31/2021 with right hip fracture awaiting operative fixation    -Assessment and Plan: *Closed displaced fracture of right femoral neck ([place])- (present on admission)--status post mechanical fall with right hip fracture -Orthopedic surgery consult appreciated plans for operative fixation on 08/02/2020 -Continue pain control  Dementia without behavioral disturbance ([place])  Aricept will be temporarily held due to prolonged QTc  (HFpEF) heart failure with preserved ejection fraction [facility]) Charlotte Henry has history of chronic diastolic dysfunction Echo done on 11/18/2020 showed LV EF of 65 to 70% with grade 1 diastolic dysfunction Charlotte Henry currently appears compensated/stable  Acute kidney injury superimposed on chronic kidney disease ([place]) BUN/creatinine 44/2.19 (baseline creatinine at 1.1-1.5). -Creatinine down to 1.96-continue to maintain adequate hydration -Continue to hold PTA Lasix Renally adjust medications, avoid nephrotoxic agents/dehydration/hypotension  -Leukocytosis--check UA, may be reactive  GERD (gastroesophageal reflux disease) Continue Protonix  Hyperlipidemia- (present on admission) Continue Zetia  Disposition/Need for in-Hospital Stay- Charlotte Henry unable to be discharged at this time due to -right hip fracture awaiting operative fixation -Charlotte Henry will need SNF rehab postsurgery  Status is: Inpatient   Disposition: The Charlotte Henry is from: Home              Anticipated d/c is to: SNF              Anticipated d/c date is: 2  days              Charlotte Henry currently is not medically stable to d/c. Barriers: Not Clinically Stable-   Code Status :  -  Code Status: DNR   Family Communication:   Discussed with Charlotte Henry's daughter at bedside    DVT Prophylaxis  :   - SCDs  SCDs Start: 07/31/21 2308   Lab Results  Component Value Date   PLT 177 08/01/2021    Inpatient Medications  Scheduled Meds:  ezetimibe  10 mg Oral Daily   mupirocin ointment  1 application Nasal BID   pantoprazole  40 mg Oral Daily   Continuous Infusions:  sodium chloride 125 mL/hr at 07/31/21 2129   PRN Meds:.fentaNYL (SUBLIMAZE) injection   Anti-infectives (From admission, onward)    None         Subjective: [name] today has no fevers, no emesis,  No chest pain,   --Charlotte Henry daughter at bedside, questions answered   Objective: Vitals:   07/31/21 2358 08/01/21 0200 08/01/21 0603 08/01/21 1347  BP: (!) 145/63 112/65 (!) 114/58 (!) 98/45  Pulse: 64 (!) 59 (!) 59 (!) 56  Resp: 18 18 18 16   Temp: 98.4 F (36.9 C) 98.9 F (37.2 C) 99 F (37.2 C) 98.4 F (36.9 C)  TempSrc: Oral Oral Oral Oral  SpO2: 94% 94% 93%   Weight: 58.5 kg     Height:        Intake/Output Summary (Last 24 hours) at 08/01/2021 1900 Last data filed at 08/01/2021 1829 Gross per 24 hour  Intake 1294.58 ml  Output 200 ml  Net 1094.58 ml   Danley Danker  Weights   07/31/21 1951 07/31/21 2358  Weight: 65 kg 58.5 kg    Physical Exam  Gen:- Awake Alert,  in no apparent distress  HEENT:- [address].AT, No sclera icterus Neck-Supple Neck,No JVD,.  Lungs-  CTAB , fair symmetrical air movement CV- S1, S2 normal, regular , pacemaker in situ Abd-  +ve B.Sounds, Abd Soft, No tenderness,    Extremity/Skin:- No  edema, pedal pulses present  Psych-baseline memory and cognitive deficits consistent with underlying dementia neuro-no new focal deficits, no tremors MSK-right lower extremity shortened and rotated  Data Reviewed: I have personally reviewed following  labs and imaging studies  CBC: Recent Labs  Lab 07/31/21 2054 08/01/21 0523  WBC 8.1 12.2*  NEUTROABS 4.7  --   HGB 10.9* 9.4*  HCT 32.7* 28.3*  MCV 92.9 93.1  PLT 199 831   Basic Metabolic Panel: Recent Labs  Lab 07/31/21 2054 08/01/21 0523  NA 139 139  K 4.5 4.3  CL 104 107  CO2 27 24  GLUCOSE 125* 137*  BUN 44* 42*  CREATININE 2.19* 1.96*  CALCIUM 9.5 9.1  MG  --  2.1  PHOS  --  3.2   GFR: Estimated Creatinine Clearance: 20.4 mL/min (A) (by C-G formula based on SCr of 1.96 mg/dL (H)). Liver Function Tests: Recent Labs  Lab 08/01/21 0523  AST 21  ALT 12  ALKPHOS 45  BILITOT 0.5  PROT 6.7  ALBUMIN 3.7   Cardiac Enzymes: No results for input(s): CKTOTAL, CKMB, CKMBINDEX, TROPONINI in the last 168 hours. BNP (last 3 results) No results for input(s): PROBNP in the last 8760 hours. HbA1C: No results for input(s): HGBA1C in the last 72 hours. Sepsis Labs: @LABRCNTIP (procalcitonin:4,lacticidven:4) ) Recent Results (from the past 240 hour(s))  Resp Panel by RT-PCR (Flu A&B, Covid) Nasopharyngeal Swab     Status: None   Collection Time: 07/31/21 11:10 PM   Specimen: Nasopharyngeal Swab; Nasopharyngeal(NP) swabs in vial transport medium  Result Value Ref Range Status   SARS Coronavirus 2 by RT PCR NEGATIVE NEGATIVE Final    Comment: (NOTE) SARS-CoV-2 target nucleic acids are NOT DETECTED.  The SARS-CoV-2 RNA is generally detectable in upper respiratory specimens during the acute phase of infection. The lowest concentration of SARS-CoV-2 viral copies this assay can detect is 138 copies/mL. A negative result does not preclude SARS-Cov-2 infection and should not be used as the sole basis for treatment or other Charlotte Henry management decisions. A negative result may occur with  improper specimen collection/handling, submission of specimen other than nasopharyngeal swab, presence of viral mutation(s) within the areas targeted by this assay, and inadequate number of  viral copies(<138 copies/mL). A negative result must be combined with clinical observations, Charlotte Henry history, and epidemiological information. The expected result is Negative.  Fact Sheet for Patients:  EntrepreneurPulse.com.au  Fact Sheet for Healthcare Providers:  IncredibleEmployment.be  This test is no t yet approved or cleared by the [place] FDA and  has been authorized for detection and/or diagnosis of SARS-CoV-2 by FDA under an Emergency Use Authorization (EUA). This EUA will remain  in effect (meaning this test can be used) for the duration of the COVID-19 declaration under Section 564(b)(1) of the Act, 21 U.S.C.section 360bbb-3(b)(1), unless the authorization is terminated  or revoked sooner.       Influenza A by PCR NEGATIVE NEGATIVE Final   Influenza B by PCR NEGATIVE NEGATIVE Final    Comment: (NOTE) The Xpert Xpress SARS-CoV-2/FLU/RSV plus assay is intended as an aid in the diagnosis  of influenza from Nasopharyngeal swab specimens and should not be used as a sole basis for treatment. Nasal washings and aspirates are unacceptable for Xpert Xpress SARS-CoV-2/FLU/RSV testing.  Fact Sheet for Patients: EntrepreneurPulse.com.au  Fact Sheet for Healthcare Providers: IncredibleEmployment.be  This test is not yet approved or cleared by the [place] FDA and has been authorized for detection and/or diagnosis of SARS-CoV-2 by FDA under an Emergency Use Authorization (EUA). This EUA will remain in effect (meaning this test can be used) for the duration of the COVID-19 declaration under Section 564(b)(1) of the Act, 21 U.S.C. section 360bbb-3(b)(1), unless the authorization is terminated or revoked.  Performed at [facility], [address], [place], [place] [address]   Surgical PCR screen     Status: None   Collection Time: 08/01/21  5:14 AM   Specimen: Nasal Mucosa; Nasal Swab  Result  Value Ref Range Status   MRSA, PCR NEGATIVE NEGATIVE Final   Staphylococcus aureus NEGATIVE NEGATIVE Final    Comment: (NOTE) The Xpert SA Assay (FDA approved for NASAL specimens in patients 59 years of age and older), is one component of a comprehensive surveillance program. It is not intended to diagnose infection nor to guide or monitor treatment. Performed at [facility], [address], [place], [place] [address]       Radiology Studies: DG Lumbar Spine Complete  Result Date: 07/31/2021 CLINICAL DATA:  Fall.  Back pain and right hip pain. EXAM: LUMBAR SPINE - COMPLETE 4+ VIEW COMPARISON:  CT 07/09/2018 FINDINGS: Study degraded by artifact on the lateral views. No evidence of fracture from the lower thoracic region to the sacrum. Chronic lower lumbar disc space narrowing with degenerative anterolisthesis at L4-5 of about 4 mm. Regional arterial calcification incidentally noted. IMPRESSION: No acute fracture seen. Lower lumbar degenerative changes. Lateral views suffer from overlying artifact. Electronically Signed   By: Nelson Chimes M.D.   On: 07/31/2021 22:25   DG Hip Unilat  With Pelvis 2-3 Views Right  Result Date: 07/31/2021 CLINICAL DATA:  [place] [address] with hip pain EXAM: DG HIP (WITH OR WITHOUT PELVIS) 2-3V RIGHT COMPARISON:  03/04/2021 FINDINGS: Femoral neck fracture on the right. Bones of the pelvis appear unremarkable. Left hip appears normal. IMPRESSION: Right femoral neck fracture. Electronically Signed   By: Nelson Chimes M.D.   On: 07/31/2021 22:23     Scheduled Meds:  ezetimibe  10 mg Oral Daily   mupirocin ointment  1 application Nasal BID   pantoprazole  40 mg Oral Daily   Continuous Infusions:  sodium chloride 125 mL/hr at 07/31/21 2129     LOS: 1 day    Roxan Hockey M.D on 08/01/2021 at 7:00 PM  Go to [email or web address] - for contact info  [facility] - Office  [phone]  If 7PM-7AM, please contact night-coverage [email or web address] Password [facility] 08/01/2021,  7:00 PM

## 2021-08-01 NOTE — Assessment & Plan Note (Addendum)
Continue Zetia. °

## 2021-08-01 NOTE — Assessment & Plan Note (Addendum)
-  QTc 511 ms, patient has a paced rhythm -Continue to maintain stable electrolytes level (specially magnesium and potassium; goal is for potassium above 4 and magnesium above 2). -Maintain adequate hydration -Minimize the use of medications that can prolong QT.

## 2021-08-01 NOTE — Assessment & Plan Note (Addendum)
-  Resume the use of Aricept and Seroquel -Consult reorientation recommended.  -Currently with a stable mood.

## 2021-08-01 NOTE — Assessment & Plan Note (Addendum)
-  Right hip x-ray showed right femoral neck fracture -Patient had a fall at home -Status post ORIF of patient's right hip to stabilize fracture. -Overall well-tolerated -Weightbearing as tolerated as per orthopedic service instructions -Baby aspirin twice a day for DVT prophylaxis -Continue as needed pain medication -Follow-up in 2 weeks with orthopedic service; reinforce dressing as needed and trim stitches at 2 weeks mark if unable to arrange follow-up with Dr. Amedeo Kinsman. -Patient has been discharged to skilled nursing facility for further care and rehabilitation. -Repeat CBC in 5 days to follow-up hemoglobin and stability.

## 2021-08-01 NOTE — Assessment & Plan Note (Addendum)
-  BUN/creatinine 44/2.19 (baseline creatinine at 1.1-1.5). -After fluid resuscitation and minimizing nephrotoxic agents patient's creatinine 1.4 -Continue to Renally adjust medications and avoid nephrotoxic agents/dehydration/hypotension. -Maintain adequate hydration -Repeat basic metabolic panel in 5 days to follow electrolytes and renal function and stability. -Patient with chronic kidney disease a stage IIIb as already mentioned.

## 2021-08-01 NOTE — Assessment & Plan Note (Addendum)
-  Chronic and compensated -Patient has history of chronic diastolic dysfunction; Echo done on 11/18/2020 showed LV EF of 65 to 70% with grade 1 diastolic dysfunction. -Continue to follow adequate sodium intake and hydration -Check weight on daily basis. -Resume as needed use of Lasix for fluid buildup, shortness of breath and edema.

## 2021-08-02 ENCOUNTER — Inpatient Hospital Stay (HOSPITAL_COMMUNITY): Payer: Medicare Other | Admitting: Certified Registered"

## 2021-08-02 ENCOUNTER — Encounter: Payer: Medicare Other | Admitting: Internal Medicine

## 2021-08-02 ENCOUNTER — Encounter (HOSPITAL_COMMUNITY): Payer: Self-pay | Admitting: Internal Medicine

## 2021-08-02 ENCOUNTER — Other Ambulatory Visit: Payer: Self-pay

## 2021-08-02 ENCOUNTER — Inpatient Hospital Stay (HOSPITAL_COMMUNITY): Payer: Medicare Other

## 2021-08-02 ENCOUNTER — Encounter (HOSPITAL_COMMUNITY)
Admission: EM | Disposition: A | Discharge status: Skilled Nursing Facility | Payer: Self-pay | Source: Home / Self Care | Attending: Family Medicine

## 2021-08-02 DIAGNOSIS — S72001A Fracture of unspecified part of neck of right femur, initial encounter for closed fracture: Secondary | ICD-10-CM

## 2021-08-02 DIAGNOSIS — I2510 Atherosclerotic heart disease of native coronary artery without angina pectoris: Secondary | ICD-10-CM

## 2021-08-02 DIAGNOSIS — J449 Chronic obstructive pulmonary disease, unspecified: Secondary | ICD-10-CM

## 2021-08-02 DIAGNOSIS — I10 Essential (primary) hypertension: Secondary | ICD-10-CM

## 2021-08-02 HISTORY — PX: HIP PINNING,CANNULATED: SHX1758

## 2021-08-02 LAB — BASIC METABOLIC PANEL
Anion gap: 9 (ref 5–15)
BUN: 31 mg/dL — ABNORMAL HIGH (ref 8–23)
CO2: 20 mmol/L — ABNORMAL LOW (ref 22–32)
Calcium: 8.5 mg/dL — ABNORMAL LOW (ref 8.9–10.3)
Chloride: 111 mmol/L (ref 98–111)
Creatinine, Ser: 1.65 mg/dL — ABNORMAL HIGH (ref 0.44–1.00)
GFR, Estimated: 31 mL/min — ABNORMAL LOW (ref >60)
Glucose, Bld: 75 mg/dL (ref 70–99)
Potassium: 4.1 mmol/L (ref 3.5–5.1)
Sodium: 140 mmol/L (ref 135–145)

## 2021-08-02 LAB — CBC
HCT: 29.6 % — ABNORMAL LOW (ref 36.0–46.0)
Hemoglobin: 9.2 g/dL — ABNORMAL LOW (ref 12.0–15.0)
MCH: 29.3 pg (ref 26.0–34.0)
MCHC: 31.1 g/dL (ref 30.0–36.0)
MCV: 94.3 fL (ref 80.0–100.0)
Platelets: 173 10*3/uL (ref 150–400)
RBC: 3.14 MIL/uL — ABNORMAL LOW (ref 3.87–5.11)
RDW: 13.5 % (ref 11.5–15.5)
WBC: 6.9 10*3/uL (ref 4.0–10.5)
nRBC: 0 % (ref 0.0–0.2)

## 2021-08-02 SURGERY — FIXATION, FEMUR, NECK, PERCUTANEOUS, USING SCREW
Anesthesia: Spinal | Site: Hip | Laterality: Right

## 2021-08-02 MED ORDER — PHENYLEPHRINE 40 MCG/ML (10ML) SYRINGE FOR IV PUSH (FOR BLOOD PRESSURE SUPPORT)
PREFILLED_SYRINGE | INTRAVENOUS | Status: DC | PRN
  Administered 2021-08-02 (×2): 80 ug via INTRAVENOUS
  Administered 2021-08-02 (×2): 120 ug via INTRAVENOUS

## 2021-08-02 MED ORDER — LACTATED RINGERS IV BOLUS
500.0000 mL | Freq: Once | INTRAVENOUS | Status: AC
  Administered 2021-08-02: 500 mL via INTRAVENOUS

## 2021-08-02 MED ORDER — PHENYLEPHRINE HCL-NACL 20-0.9 MG/250ML-% IV SOLN
INTRAVENOUS | Status: DC | PRN
  Administered 2021-08-02: 100 ug/min via INTRAVENOUS

## 2021-08-02 MED ORDER — CEFTRIAXONE SODIUM 1 G IJ SOLR
1.0000 g | INTRAMUSCULAR | Status: DC
  Administered 2021-08-02: 1 g via INTRAVENOUS
  Filled 2021-08-02: qty 10

## 2021-08-02 MED ORDER — PROPOFOL 10 MG/ML IV BOLUS
INTRAVENOUS | Status: DC | PRN
  Administered 2021-08-02 (×2): 20 mg via INTRAVENOUS

## 2021-08-02 MED ORDER — CHLORHEXIDINE GLUCONATE 0.12 % MT SOLN
15.0000 mL | Freq: Once | OROMUCOSAL | Status: AC
  Administered 2021-08-02: 15 mL via OROMUCOSAL

## 2021-08-02 MED ORDER — ORAL CARE MOUTH RINSE: 15.0000 mL | Freq: Once | OROMUCOSAL | Status: AC

## 2021-08-02 MED ORDER — BUPIVACAINE IN DEXTROSE 0.75-8.25 % IT SOLN
INTRATHECAL | Status: DC | PRN
  Administered 2021-08-02: 1.6 mL via INTRATHECAL

## 2021-08-02 MED ORDER — PROPOFOL 10 MG/ML IV BOLUS
INTRAVENOUS | Status: AC
  Filled 2021-08-02: qty 20

## 2021-08-02 MED ORDER — FENTANYL CITRATE (PF) 100 MCG/2ML IJ SOLN
INTRAMUSCULAR | Status: DC | PRN
  Administered 2021-08-02: 25 ug via INTRAVENOUS

## 2021-08-02 MED ORDER — PHENYLEPHRINE HCL-NACL 20-0.9 MG/250ML-% IV SOLN
0.0000 ug/min | INTRAVENOUS | Status: DC
  Administered 2021-08-02: 70 ug/min via INTRAVENOUS

## 2021-08-02 MED ORDER — VANCOMYCIN HCL IN DEXTROSE 1-5 GM/200ML-% IV SOLN
1000.0000 mg | INTRAVENOUS | Status: AC
  Administered 2021-08-02: 1000 mg via INTRAVENOUS
  Filled 2021-08-02: qty 200

## 2021-08-02 MED ORDER — SODIUM CHLORIDE 0.9 % IV SOLN
0.0000 ug/min | INTRAVENOUS | Status: DC
  Filled 2021-08-02: qty 2

## 2021-08-02 MED ORDER — SODIUM CHLORIDE 0.9 % IV SOLN: INTRAVENOUS | Status: AC

## 2021-08-02 MED ORDER — FENTANYL CITRATE (PF) 100 MCG/2ML IJ SOLN
INTRAMUSCULAR | Status: AC
  Filled 2021-08-02: qty 2

## 2021-08-02 MED ORDER — 0.9 % SODIUM CHLORIDE (POUR BTL) OPTIME
TOPICAL | Status: DC | PRN
  Administered 2021-08-02: 1000 mL

## 2021-08-02 MED ORDER — TRANEXAMIC ACID-NACL 1000-0.7 MG/100ML-% IV SOLN
1000.0000 mg | INTRAVENOUS | Status: AC
  Administered 2021-08-02: 1000 mg via INTRAVENOUS
  Filled 2021-08-02: qty 100

## 2021-08-02 MED ORDER — PROPOFOL 500 MG/50ML IV EMUL
INTRAVENOUS | Status: DC | PRN
  Administered 2021-08-02: 30 ug/kg/min via INTRAVENOUS

## 2021-08-02 MED ORDER — LACTATED RINGERS IV SOLN: INTRAVENOUS | Status: DC

## 2021-08-02 MED ORDER — CHLORHEXIDINE GLUCONATE CLOTH 2 % EX PADS
6.0000 | MEDICATED_PAD | Freq: Every day | CUTANEOUS | Status: DC
  Administered 2021-08-02 – 2021-08-04 (×3): 6 via TOPICAL

## 2021-08-02 MED ORDER — FENTANYL CITRATE PF 50 MCG/ML IJ SOSY: 25.0000 ug | PREFILLED_SYRINGE | INTRAMUSCULAR | Status: DC | PRN

## 2021-08-02 SURGICAL SUPPLY — 50 items
BAG HAMPER (MISCELLANEOUS) ×2 IMPLANT
BLADE SURG SZ10 CARB STEEL (BLADE) ×4 IMPLANT
BNDG GAUZE ELAST 4 BULKY (GAUZE/BANDAGES/DRESSINGS) ×2 IMPLANT
CATH FOLEY LATEX FREE 16FR (CATHETERS) ×4
CATH FOLEY LF 16FR (CATHETERS) IMPLANT
CHLORAPREP W/TINT 26 (MISCELLANEOUS) ×2 IMPLANT
CLOTH BEACON ORANGE TIMEOUT ST (SAFETY) ×2 IMPLANT
CLSR STERI-STRIP ANTIMIC 1/2X4 (GAUZE/BANDAGES/DRESSINGS) ×2 IMPLANT
COVER LIGHT HANDLE STERIS (MISCELLANEOUS) ×4 IMPLANT
COVER MAYO STAND XLG (MISCELLANEOUS) ×1 IMPLANT
COVER PERINEAL POST (MISCELLANEOUS) ×2 IMPLANT
DECANTER SPIKE VIAL GLASS SM (MISCELLANEOUS) ×4 IMPLANT
DRAPE STERI IOBAN 125X83 (DRAPES) ×2 IMPLANT
DRSG MEPILEX SACRM 8.7X9.8 (GAUZE/BANDAGES/DRESSINGS) ×2 IMPLANT
DRSG TEGADERM 4X4.75 (GAUZE/BANDAGES/DRESSINGS) ×3 IMPLANT
GAUZE SPONGE 4X4 12PLY STRL (GAUZE/BANDAGES/DRESSINGS) ×2 IMPLANT
GLOVE SRG 8 PF TXTR STRL LF DI (GLOVE) ×1 IMPLANT
GLOVE SURG POLYISO LF SZ6.5 (GLOVE) ×1 IMPLANT
GLOVE SURG POLYISO LF SZ8 (GLOVE) ×4 IMPLANT
GLOVE SURG UNDER POLY LF SZ7 (GLOVE) ×4 IMPLANT
GLOVE SURG UNDER POLY LF SZ8 (GLOVE) ×2
GOWN STRL REUS W/ TWL XL LVL3 (GOWN DISPOSABLE) ×1 IMPLANT
GOWN STRL REUS W/TWL LRG LVL3 (GOWN DISPOSABLE) ×4 IMPLANT
GOWN STRL REUS W/TWL XL LVL3 (GOWN DISPOSABLE) ×2
INST SET MAJOR BONE (KITS) ×2 IMPLANT
KIT BLADEGUARD II DBL (SET/KITS/TRAYS/PACK) ×2 IMPLANT
KIT TURNOVER CYSTO (KITS) ×2 IMPLANT
MANIFOLD NEPTUNE II (INSTRUMENTS) ×2 IMPLANT
MARKER SKIN DUAL TIP RULER LAB (MISCELLANEOUS) ×2 IMPLANT
NDL HYPO 21X1.5 SAFETY (NEEDLE) ×1 IMPLANT
NEEDLE HYPO 21X1.5 SAFETY (NEEDLE) ×2 IMPLANT
NS IRRIG 1000ML POUR BTL (IV SOLUTION) ×2 IMPLANT
PACK BASIC III (CUSTOM PROCEDURE TRAY) ×2
PACK SRG BSC III STRL LF ECLPS (CUSTOM PROCEDURE TRAY) ×1 IMPLANT
PAD ABD 5X9 TENDERSORB (GAUZE/BANDAGES/DRESSINGS) ×2 IMPLANT
PENCIL SMOKE EVACUATOR COATED (MISCELLANEOUS) ×2 IMPLANT
PIN GUIDE DRILL TIP 2.8X300 (DRILL) IMPLANT
PIN GUIDE THRD AR 3.2X330 (PIN) ×3 IMPLANT
SCREW CANN 7X80 (Screw) ×1 IMPLANT
SCREW CANN THRD 7X75 (Screw) ×2 IMPLANT
SET BASIN LINEN APH (SET/KITS/TRAYS/PACK) ×2 IMPLANT
SPONGE T-LAP 18X18 ~~LOC~~+RFID (SPONGE) ×4 IMPLANT
SUT MNCRL AB 4-0 PS2 18 (SUTURE) ×2 IMPLANT
SUT MON AB 2-0 CT1 36 (SUTURE) ×2 IMPLANT
SUT VIC AB 0 CT1 27 (SUTURE) ×2
SUT VIC AB 0 CT1 27XBRD ANTBC (SUTURE) ×1 IMPLANT
SYR 30ML LL (SYRINGE) ×2 IMPLANT
SYR BULB IRRIG 60ML STRL (SYRINGE) ×4 IMPLANT
WASHER FLAT 7.0 (Washer) ×2 IMPLANT
YANKAUER SUCT BULB TIP 10FT TU (MISCELLANEOUS) ×2 IMPLANT

## 2021-08-02 NOTE — Anesthesia Procedure Notes (Signed)
Spinal  Patient location during procedure: OR Start time: 08/02/2021 12:20 PM End time: 08/02/2021 12:30 PM Reason for block: surgical anesthesia Staffing Performed: anesthesiologist and resident/CRNA  Anesthesiologist: Denese Killings, MD Resident/CRNA: Myna Bright, CRNA Preanesthetic Checklist Completed: patient identified, IV checked, site marked, risks and benefits discussed, surgical consent, monitors and equipment checked, pre-op evaluation and timeout performed Spinal Block Patient position: right lateral decubitus Prep: DuraPrep Patient monitoring: heart rate, cardiac monitor, continuous pulse ox and blood pressure Approach: midline Location: L3-4 Injection technique: single-shot Needle Needle type: Sprotte and Spinocan  Needle gauge: 24 G Needle length: 9 cm Needle insertion depth: 7 cm Assessment Sensory level: T4 Events: CSF return Additional Notes First attempted by CRNA, second attempt by anesthesiologist.

## 2021-08-02 NOTE — Plan of Care (Signed)
  Problem: Clinical Measurements: Goal: Ability to maintain clinical measurements within normal limits will improve Outcome: Progressing   

## 2021-08-02 NOTE — Anesthesia Postprocedure Evaluation (Signed)
Anesthesia Post Note  Patient: Charlotte Henry  Procedure(s) Performed: CANNULATED HIP PINNING (Right: Hip)  Patient location during evaluation: PACU Anesthesia Type: Combined General/Spinal Level of consciousness: awake and alert and oriented Pain management: pain level controlled Vital Signs Assessment: post-procedure vital signs reviewed and stable Respiratory status: spontaneous breathing, nonlabored ventilation and respiratory function stable Cardiovascular status: blood pressure returned to baseline and stable Postop Assessment: no apparent nausea or vomiting Anesthetic complications: no   No notable events documented.   Last Vitals:  Vitals:   08/02/21 1505 08/02/21 1507  BP: (!) 121/53 (!) 124/54  Pulse:    Resp: 13 12  Temp:    SpO2: 100%     Last Pain:  Vitals:   08/02/21 1500  TempSrc:   PainSc: 2                  Deette Revak C Delorus Langwell

## 2021-08-02 NOTE — Op Note (Signed)
Orthopaedic Surgery Operative Note (CSN: [id number])  Charlotte Henry  02-May-1941 Date of Surgery: 07/31/2021 - 08/02/2021   Diagnoses:  Right femoral neck fracture  Procedure: CRPP of the Right femoral neck fracture   Operative Finding Successful completion of the planned procedure.  Placement of 3 x 7.3 mm cannulated screws with washers (x2) to stabilize the Valgus impacted femoral neck fracture   Post-Op Diagnosis: Same Surgeons:Primary: Mordecai Rasmussen, MD Assistants:  None Location: [facility] OR ROOM 3 Anesthesia: Sedation plus regional anesthesia Antibiotics: Vancomycin Tourniquet time: N/A Estimated Blood Loss: Minimal Complications: None Specimens: None Implants: Implant Name Type Inv. Item Serial No. Manufacturer Lot No. LRB No. Used Action  WASHER FLAT 7.0 - SSTERILE ON SET Washer WASHER FLAT 7.0 STERILE ON SET [employer]  Right 2 Implanted  SCREW CANN 7X80 - SSTERILE ON SET Screw SCREW CANN 7X80 STERILE ON SET [employer]  Right 1 Implanted  SCREW CANN THRD 7X75 - SSTERILE ON SET Screw SCREW CANN THRD 7X75 STERILE ON SET [employer]  Right 2 Implanted    Indications for Surgery:   Charlotte Henry is a 81 y.o. female who sustained a mechanical fall and sustained a non displaced right femoral neck fracture.  Benefits and risks of operative and nonoperative management were discussed prior to surgery with the patient's guardian and informed consent form was completed.  Specific risks including infection, need for additional surgery, nonunion, malunion, AVN and more severe complications associated with anesthesia.  The patient's HCPOA elected to proceed.  Consent was finalized.    Procedure:   The patient was identified properly. Informed consent was obtained and the surgical site was marked. The patient was taken up to suite where general anesthesia was induced.  The patient was positioned supine on a fracture table.  The right leg was prepped and draped in the usual sterile  fashion.  Timeout was performed before the beginning of the case.  We started by making a lateral incision over the hip, in line with the starting point for the screws.  We incised sharply through the IT band to expose the lateral border of the femur.  Under fluoroscopic guidance, we placed the first guide wire along the inferior aspect of the femoral neck.  We ensured that the starting point was superior to the lesser trochanter so as to reduce the risk of a stress riser.  Orthogonal images confirmed the placement of the first guidewire to be inferior and posterior within the femoral neck.  We then placed the targeting device and placed 2 additional guidewires in the superior and anterior and then superior and posterior femoral neck. We used fluoroscopy we were satisfied with the positioning of the guidewires.  Using the measuring device, we determined the length of screw needed.  We then proceeded to drill the lateral cortex.  Three screws were then inserted under fluoroscopic guidance, 2 had washers but the 3rd did not due to limited space on the lateral cortex. Orthogonal imaging confirmed the location of the screws of sufficient length and the washers were flush with the lateral cortex.   We irrigated the wound copiously and then closed the incision in a multilayer fashion with absorbable suture.  Sterile dressing was placed.  Patient was awoken taken to PACU in stable condition.   Post-operative plan:  The patient will be returned to the floor.   Weightbearing status:  WBAT RLE Dressing to remain in place until POD#2/3 DVT prophylaxis per primary team, no orthopedic contraindications.  Pain control with PRN pain medication preferring oral medicines.   Follow up plan will be scheduled in approximately 10-14 days days for incision check and XR of the Right hip.

## 2021-08-02 NOTE — Progress Notes (Signed)
° °  ORTHOPAEDIC PROGRESS NOTE  Scheduled for Right Hip CRPP  DOS: 08/02/21  SUBJECTIVE: No issues over night.  No family at bedside.  She does not respond to questions  OBJECTIVE: PE:  Sleeping, but wakes up  Both hips are flexed.  She is reaching for her right hip Confused Toes are warm and well perfused  Vitals:   08/01/21 2043 08/02/21 0448  BP: (!) 113/55 139/66  Pulse: 62 60  Resp: 19 18  Temp: 98.5 F (36.9 C) (!) 97.3 F (36.3 C)  SpO2: 95% 96%     ASSESSMENT: Charlotte Henry is a 81 y.o. female doing well.  Ready for OR.  NPO since midnight.   PLAN: Weightbearing: NWB RLE Insicional and dressing care: Reinforce dressings as needed; none currently Orthopedic device(s): None VTE prophylaxis: Recommend Aspirin 81mg  BID; to begin POD#1 Pain control: PO pain medications as needed; judicious use of narcotics Follow - up plan: 2 weeks   Contact information:     Kameran Mcneese A. Amedeo Kinsman, MD [facility] [place] [address] [place],  [place]  [address] Phone: [phone] Fax: [phone]

## 2021-08-02 NOTE — NC FL2 (Signed)
[place] LEVEL OF CARE SCREENING TOOL     IDENTIFICATION  Patient Name: Charlotte Henry Birthdate: 21-Sep-1940 Sex: female Admission Date (Current Location): 07/31/2021  [facility] and [place] Number:  [employer] and Address:  [facility] [address], [place]      Provider Number: [id number]  Attending Physician Name and Address:  Roxan Hockey, MD  Relative Name and Phone Number:       Current Level of Care: Hospital Recommended Level of Care: [facility] Prior Approval Number:    Date Approved/Denied:   PASRR Number: [id number]  Discharge Plan: SNF    Current Diagnoses: Patient Active Problem List   Diagnosis Date Noted   Acute kidney injury superimposed on chronic kidney disease ([place]) 08/01/2021   Prolonged QT interval 08/01/2021   (HFpEF) heart failure with preserved ejection fraction ([place]) 08/01/2021   Dementia without behavioral disturbance ([place]) 08/01/2021   Closed displaced fracture of right femoral neck ([place]) 07/31/2021   Bilateral lower extremity edema 12/03/2020   Hypertension associated with chronic kidney disease due to type 2 diabetes mellitus ([place]) 12/02/2020   Aortic atherosclerosis ([place]) 12/02/2020   Type 2 diabetes mellitus with stage 3 chronic kidney disease, without long-term current use of insulin ([place]) 12/02/2020   Stage 3 chronic kidney disease due to type 2 diabetes mellitus ([place]) 12/02/2020   Hyperlipidemia associated with type 2 diabetes mellitus ([place]) 12/02/2020   Vascular dementia without behavioral disturbance ([place]) 12/02/2020   Major depression, recurrent, chronic ([place]) 12/02/2020   Bilateral cellulitis of lower leg 11/30/2020   Pressure injury of skin 11/16/2020   Hypotension 11/15/2020   Acute lower UTI 11/15/2020   Cellulitis 11/15/2020   Status cardiac pacemaker 11/15/2020   Multiple falls 11/15/2020   Iron deficiency anemia 04/28/2020   AAA (abdominal aortic  aneurysm) 02/17/2019   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[place]) 01/10/2018   Itching 07/19/2017   Gait instability 02/26/2017   Symptomatic anemia 08/15/2016   Protein-calorie malnutrition ([place]) 08/04/2016   Normocytic anemia 07/03/2016   CHB (complete heart block) (HCC)    COPD (chronic obstructive pulmonary disease) ([place])    Tobacco use disorder 10/15/2015   OA (osteoarthritis) 12/30/2014   Carotid stenosis 03/11/2014   PVD (peripheral vascular disease) ([place]) 03/11/2014   Colon cancer screening 02/04/2014   Overactive bladder 12/31/2013   Rash and nonspecific skin eruption 05/19/2013   Peripheral neuropathy 07/01/2012   Chronic kidney disease (CKD), stage III (moderate) ([place]) 08/25/2010   Cardiovascular disease 08/16/2009   Cerebrovascular disease 08/16/2009   Peripheral vascular disease ([place]) 08/16/2009   Type 2 diabetes mellitus with diabetic neuropathy, unspecified ([place]) 10/26/2008   Hyperlipidemia 10/26/2008   Essential hypertension 10/26/2008   Asthmatic bronchitis 10/26/2008   GERD (gastroesophageal reflux disease) 10/26/2008    Orientation RESPIRATION BLADDER Height & Weight     Self, Time, Situation, Place  O2 (2L) Incontinent Weight: 128 lb 15.5 oz (58.5 kg) Height:  5\' 7"  (170.2 cm)  BEHAVIORAL SYMPTOMS/MOOD NEUROLOGICAL BOWEL NUTRITION STATUS      Continent Diet (See D/C summary)  AMBULATORY STATUS COMMUNICATION OF NEEDS Skin   Extensive Assist Verbally Surgical wounds                       Personal Care Assistance Level of Assistance  Bathing, Feeding, Dressing Bathing Assistance: Limited assistance Feeding assistance: Independent Dressing Assistance: Limited assistance     Functional Limitations Info  Sight, Hearing,  Speech Sight Info: Adequate Hearing Info: Adequate Speech Info: Adequate    SPECIAL CARE FACTORS FREQUENCY  PT (By licensed PT), OT (By licensed OT)     PT Frequency: 5 times weekly OT Frequency: 5 times weekly             Contractures Contractures Info: Not present    Additional Factors Info  Code Status, Allergies Code Status Info: DNR Allergies Info: Ace Inhibitors   Aspirin   Codeine   Pineapple   Plasticized Base (Plastibase)   Ultram (Tramadol)   Adhesive (Tape)   Naprosyn (Naproxen)   Biaxin (Clarithromycin)   Clindamycin/lincomycin   Linzess (Linaclotide)   Metronidazole   Nsaids   Penicillins   Varenicline Tartrate   Xarelto (Rivaroxaban)           Current Medications (08/02/2021):  This is the current hospital active medication list Current Facility-Administered Medications  Medication Dose Route Frequency Provider Last Rate Last Admin   0.9 %  sodium chloride infusion   Intravenous Continuous Emokpae, Courage, MD 75 mL/hr at 08/01/21 1927 Rate Change at 08/01/21 1927   0.9 % irrigation (POUR BTL)    PRN Mordecai Rasmussen, MD   1,000 mL at 08/02/21 1221   [MAR Hold] ezetimibe (ZETIA) tablet 10 mg  10 mg Oral Daily Adefeso, Oladapo, DO   10 mg at 08/02/21 0819   [MAR Hold] fentaNYL (SUBLIMAZE) injection 25 mcg  25 mcg Intravenous Q2H PRN Adefeso, Oladapo, DO   25 mcg at 08/02/21 0049   fentaNYL (SUBLIMAZE) injection 25-50 mcg  25-50 mcg Intravenous Q5 min PRN Battula, Rajamani C, MD       lactated ringers infusion   Intravenous Continuous Battula, Rajamani C, MD 50 mL/hr at 08/02/21 1211 Continued from Pre-op at 08/02/21 1211   [MAR Hold] oxyCODONE (Oxy IR/ROXICODONE) immediate release tablet 5 mg  5 mg Oral Q4H PRN Emokpae, Courage, MD   5 mg at 08/02/21 0910   [MAR Hold] pantoprazole (PROTONIX) EC tablet 40 mg  40 mg Oral Daily Adefeso, Oladapo, DO   40 mg at 08/02/21 0819   [MAR Hold] senna-docusate (Senokot-S) tablet 2 tablet  2 tablet Oral BID Roxan Hockey, MD   2 tablet at 08/02/21 0819   [MAR Hold] traZODone (DESYREL) tablet 50 mg  50 mg Oral QHS Emokpae, Courage, MD   50 mg at 08/01/21 2129   Facility-Administered Medications Ordered in Other Encounters  Medication Dose Route  Frequency Provider Last Rate Last Admin   fentaNYL (SUBLIMAZE) injection   Intravenous Anesthesia Intra-op Myna Bright, CRNA   25 mcg at 08/02/21 1219   phenylephrine (NEO-SYNEPHRINE) 20mg /NS 223mL premix infusion   Intravenous Continuous PRN Myna Bright, CRNA 67.5 mL/hr at 08/02/21 1338 90 mcg/min at 08/02/21 1338   PHENYLephrine 40 mcg/ml in normal saline Adult IV Push Syringe (For Blood Pressure Support)   Intravenous Anesthesia Intra-op Myna Bright, CRNA   120 mcg at 08/02/21 1235   propofol (DIPRIVAN) 10 mg/mL bolus/IV push   Intravenous Anesthesia Intra-op Myna Bright, CRNA   20 mg at 08/02/21 1223   propofol (DIPRIVAN) 500 MG/50ML infusion   Intravenous Continuous PRN Myna Bright, CRNA   Stopped at 08/02/21 1340     Discharge Medications: Please see discharge summary for a list of discharge medications.  Relevant Imaging Results:  Relevant Lab Results:   Additional Information SSN [facility] [address], [place]

## 2021-08-02 NOTE — Transfer of Care (Signed)
Immediate Anesthesia Transfer of Care Note  Patient: Charlotte Henry  Procedure(s) Performed: CANNULATED HIP PINNING (Right: Hip)  Patient Location: PACU  Anesthesia Type:Spinal  Level of Consciousness: awake  Airway & Oxygen Therapy: Patient Spontanous Breathing and Patient connected to nasal cannula oxygen  Post-op Assessment: Report given to RN and Post -op Vital signs reviewed and stable  Post vital signs: Reviewed and stable  Last Vitals:  Vitals Value Taken Time  BP 127/51 08/02/21 1351  Temp    Pulse    Resp 14 08/02/21 1354  SpO2    Vitals shown include unvalidated device data.  Last Pain:  Vitals:   08/02/21 1115  TempSrc: Oral  PainSc: 8       Patients Stated Pain Goal: 8 (40/08/67 6195)  Complications: No notable events documented.

## 2021-08-02 NOTE — Progress Notes (Signed)
PROGRESS NOTE     Charlotte Henry, is a 81 y.o. female, DOB - [birth year], YBO:[id number]  Admit date - 07/31/2021   Admitting Physician Bernadette Hoit, DO  Outpatient Primary MD for the patient is Patient, No Pcp Per (Inactive)  LOS - 2  Chief Complaint  Patient presents with   Fall        Brief Narrative:  81 y.o. female with medical history significant of  hypertension, peripheral vascular disease, CKD 3, COPD, s/p pacemaker status admitted on 07/31/2021 with right hip fracture and s/p operative fixation on 08/02/21    -Assessment and Plan: *Closed displaced fracture of right femoral neck ([place])- (present on admission)--status post mechanical fall with right hip fracture -Orthopedic surgery consult appreciated S/p  operative fixation on 08/02/2020 -Post-operative plan:  Weightbearing status:  WBAT RLE Dressing to remain in place until POD#2/3 Orthopedic Follow up plan will be scheduled in approximately 10-14 days days for incision check and XR of the Right hip. -Further management per orthopedic team/Dr. Amedeo Kinsman  Dementia without behavioral disturbance ([place])  Aricept will be temporarily held due to prolonged QTc  (HFpEF) heart failure with preserved ejection fraction [facility]) Patient has history of chronic diastolic dysfunction Echo done on 11/18/2020 showed LV EF of 65 to 70% with grade 1 diastolic dysfunction Patient currently appears compensated/stable -IV fluids for now as patient is n.p.o. -Be judicious with IV fluids and discontinue IV fluids if tolerating oral intake well  Acute kidney injury superimposed on chronic kidney disease ([place]) BUN/creatinine 44/2.19 (baseline creatinine at 1.1-1.5). -Creatinine down to 1.65 --continue to maintain adequate hydration -Continue to hold PTA Lasix Renally adjust medications, avoid nephrotoxic agents/dehydration/hypotension  Possible UTI --leukocytosis resolved, UA suggestive of UTI -IV Rocephin as ordered -Urine culture  requested  GERD (gastroesophageal reflux disease) Continue Protonix  Hyperlipidemia- (present on admission) Continue Zetia  Chronic anemia--monitor closely given hip surgery transfuse as clinically indicated  --hemoglobin currently close to baseline  Disposition/Need for in-Hospital Stay- patient unable to be discharged at this time due to -right hip fracture awaiting operative fixation -Patient will need SNF rehab postsurgery  Status is: Inpatient   Disposition: The patient is from: Home              Anticipated d/c is to: SNF              Anticipated d/c date is: 2 days              Patient currently is not medically stable to d/c. Barriers: Not Clinically Stable-   Code Status :  -  Code Status: DNR   Family Communication:   Discussed with patient's daughter at bedside    DVT Prophylaxis  :   - SCDs  SCDs Start: 07/31/21 2308   Lab Results  Component Value Date   PLT 173 08/02/2021    Inpatient Medications  Scheduled Meds:  Chlorhexidine Gluconate Cloth  6 each Topical Daily   ezetimibe  10 mg Oral Daily   pantoprazole  40 mg Oral Daily   senna-docusate  2 tablet Oral BID   traZODone  50 mg Oral QHS   Continuous Infusions:  sodium chloride     PRN Meds:.fentaNYL (SUBLIMAZE) injection, oxyCODONE   Anti-infectives (From admission, onward)    Start     Dose/Rate Route Frequency Ordered Stop   08/02/21 0600  vancomycin (VANCOCIN) IVPB 1000 mg/200 mL premix        1,000 mg 200 mL/hr over 60 Minutes Intravenous On call to O.R.  08/02/21 0025 08/02/21 1335         Subjective: Marajade Lei today has no fevers, no emesis,  No chest pain,   --Patient daughter at bedside, questions answered -Resting comfortably postop  Objective: Vitals:   08/02/21 1505 08/02/21 1507 08/02/21 1510 08/02/21 1530  BP: (!) 121/53 (!) 124/54 (!) 123/54 115/60  Pulse:    62  Resp: 13 12 13 15   Temp:    97.7 F (36.5 C)  TempSrc:    Oral  SpO2: 100%  100% 97%  Weight:       Height:        Intake/Output Summary (Last 24 hours) at 08/02/2021 1755 Last data filed at 08/02/2021 1600 Gross per 24 hour  Intake 2249.8 ml  Output 950 ml  Net 1299.8 ml   Filed Weights   07/31/21 1951 07/31/21 2358 08/02/21 1115  Weight: 65 kg 58.5 kg 58.5 kg    Physical Exam  Gen:- Awake Alert,  in no apparent distress  HEENT:- [place].AT, No sclera icterus Neck-Supple Neck,No JVD,.  Lungs-  CTAB , fair symmetrical air movement CV- S1, S2 normal, regular , pacemaker in situ Abd-  +ve B.Sounds, Abd Soft, No tenderness,    Extremity/Skin:- No  edema, pedal pulses present  Psych-baseline memory and cognitive deficits consistent with underlying dementia neuro-no new focal deficits, no tremors MSK-right hip postop wound clean dry intact  Data Reviewed: I have personally reviewed following labs and imaging studies  CBC: Recent Labs  Lab 07/31/21 2054 08/01/21 0523 08/02/21 0530  WBC 8.1 12.2* 6.9  NEUTROABS 4.7  --   --   HGB 10.9* 9.4* 9.2*  HCT 32.7* 28.3* 29.6*  MCV 92.9 93.1 94.3  PLT 199 177 798   Basic Metabolic Panel: Recent Labs  Lab 07/31/21 2054 08/01/21 0523 08/02/21 0530  NA 139 139 140  K 4.5 4.3 4.1  CL 104 107 111  CO2 27 24 20*  GLUCOSE 125* 137* 75  BUN 44* 42* 31*  CREATININE 2.19* 1.96* 1.65*  CALCIUM 9.5 9.1 8.5*  MG  --  2.1  --   PHOS  --  3.2  --    GFR: Estimated Creatinine Clearance: 24.3 mL/min (A) (by C-G formula based on SCr of 1.65 mg/dL (H)). Liver Function Tests: Recent Labs  Lab 08/01/21 0523  AST 21  ALT 12  ALKPHOS 45  BILITOT 0.5  PROT 6.7  ALBUMIN 3.7   Cardiac Enzymes: No results for input(s): CKTOTAL, CKMB, CKMBINDEX, TROPONINI in the last 168 hours. BNP (last 3 results) No results for input(s): PROBNP in the last 8760 hours. HbA1C: No results for input(s): HGBA1C in the last 72 hours. Sepsis Labs: @LABRCNTIP (procalcitonin:4,lacticidven:4) ) Recent Results (from the past 240 hour(s))  Resp Panel by  RT-PCR (Flu A&B, Covid) Nasopharyngeal Swab     Status: None   Collection Time: 07/31/21 11:10 PM   Specimen: Nasopharyngeal Swab; Nasopharyngeal(NP) swabs in vial transport medium  Result Value Ref Range Status   SARS Coronavirus 2 by RT PCR NEGATIVE NEGATIVE Final    Comment: (NOTE) SARS-CoV-2 target nucleic acids are NOT DETECTED.  The SARS-CoV-2 RNA is generally detectable in upper respiratory specimens during the acute phase of infection. The lowest concentration of SARS-CoV-2 viral copies this assay can detect is 138 copies/mL. A negative result does not preclude SARS-Cov-2 infection and should not be used as the sole basis for treatment or other patient management decisions. A negative result may occur with  improper specimen collection/handling, submission of  specimen other than nasopharyngeal swab, presence of viral mutation(s) within the areas targeted by this assay, and inadequate number of viral copies(<138 copies/mL). A negative result must be combined with clinical observations, patient history, and epidemiological information. The expected result is Negative.  Fact Sheet for Patients:  EntrepreneurPulse.com.au  Fact Sheet for Healthcare Providers:  IncredibleEmployment.be  This test is no t yet approved or cleared by the [place] FDA and  has been authorized for detection and/or diagnosis of SARS-CoV-2 by FDA under an Emergency Use Authorization (EUA). This EUA will remain  in effect (meaning this test can be used) for the duration of the COVID-19 declaration under Section 564(b)(1) of the Act, 21 U.S.C.section 360bbb-3(b)(1), unless the authorization is terminated  or revoked sooner.       Influenza A by PCR NEGATIVE NEGATIVE Final   Influenza B by PCR NEGATIVE NEGATIVE Final    Comment: (NOTE) The Xpert Xpress SARS-CoV-2/FLU/RSV plus assay is intended as an aid in the diagnosis of influenza from Nasopharyngeal swab  specimens and should not be used as a sole basis for treatment. Nasal washings and aspirates are unacceptable for Xpert Xpress SARS-CoV-2/FLU/RSV testing.  Fact Sheet for Patients: EntrepreneurPulse.com.au  Fact Sheet for Healthcare Providers: IncredibleEmployment.be  This test is not yet approved or cleared by the [place] FDA and has been authorized for detection and/or diagnosis of SARS-CoV-2 by FDA under an Emergency Use Authorization (EUA). This EUA will remain in effect (meaning this test can be used) for the duration of the COVID-19 declaration under Section 564(b)(1) of the Act, 21 U.S.C. section 360bbb-3(b)(1), unless the authorization is terminated or revoked.  Performed at [facility], [address], [place], [place] [address]   Surgical PCR screen     Status: None   Collection Time: 08/01/21  5:14 AM   Specimen: Nasal Mucosa; Nasal Swab  Result Value Ref Range Status   MRSA, PCR NEGATIVE NEGATIVE Final   Staphylococcus aureus NEGATIVE NEGATIVE Final    Comment: (NOTE) The Xpert SA Assay (FDA approved for NASAL specimens in patients 5 years of age and older), is one component of a comprehensive surveillance program. It is not intended to diagnose infection nor to guide or monitor treatment. Performed at [facility], [address], [place], [place] [address]       Radiology Studies: DG Lumbar Spine Complete  Result Date: 07/31/2021 CLINICAL DATA:  Fall.  Back pain and right hip pain. EXAM: LUMBAR SPINE - COMPLETE 4+ VIEW COMPARISON:  CT 07/09/2018 FINDINGS: Study degraded by artifact on the lateral views. No evidence of fracture from the lower thoracic region to the sacrum. Chronic lower lumbar disc space narrowing with degenerative anterolisthesis at L4-5 of about 4 mm. Regional arterial calcification incidentally noted. IMPRESSION: No acute fracture seen. Lower lumbar degenerative changes. Lateral views suffer from  overlying artifact. Electronically Signed   By: Nelson Chimes M.D.   On: 07/31/2021 22:25   DG Pelvis Portable  Result Date: 08/02/2021 CLINICAL DATA:  Postop right hip ORIF EXAM: PORTABLE PELVIS 1-2 VIEWS COMPARISON:  None. FINDINGS: Right femoral neck fracture transfixed with 3 cannulated femoral neck screws without failure or complication. No other fracture or dislocation. No aggressive osseous lesion. Postsurgical changes overlying the right greater trochanter. Peripheral vascular atherosclerotic disease. IMPRESSION: 1. Interval ORIF right femoral neck fracture. Electronically Signed   By: Kathreen Devoid M.D.   On: 08/02/2021 14:36   DG C-Arm 1-60 Min-No Report  Result Date: 08/02/2021 Fluoroscopy was utilized by the requesting  physician.  No radiographic interpretation.   DG C-Arm 1-60 Min-No Report  Result Date: 08/02/2021 Fluoroscopy was utilized by the requesting physician.  No radiographic interpretation.   DG HIP UNILAT WITH PELVIS 2-3 VIEWS RIGHT  Result Date: 08/02/2021 CLINICAL DATA:  Right hip pinning EXAM: DG HIP (WITH OR WITHOUT PELVIS) 2-3V RIGHT COMPARISON:  07/31/2021 FINDINGS: C-arm images of the right hip retained. 3 screws are present across the femoral neck into the femoral head. Satisfactory hardware placement. IMPRESSION: Satisfactory pinning of right femoral neck fracture. Electronically Signed   By: Franchot Gallo M.D.   On: 08/02/2021 14:10   DG Hip Unilat  With Pelvis 2-3 Views Right  Result Date: 07/31/2021 CLINICAL DATA:  [place] with hip pain EXAM: DG HIP (WITH OR WITHOUT PELVIS) 2-3V RIGHT COMPARISON:  03/04/2021 FINDINGS: Femoral neck fracture on the right. Bones of the pelvis appear unremarkable. Left hip appears normal. IMPRESSION: Right femoral neck fracture. Electronically Signed   By: Nelson Chimes M.D.   On: 07/31/2021 22:23     Scheduled Meds:  Chlorhexidine Gluconate Cloth  6 each Topical Daily   ezetimibe  10 mg Oral Daily   pantoprazole  40 mg Oral Daily    senna-docusate  2 tablet Oral BID   traZODone  50 mg Oral QHS   Continuous Infusions:  sodium chloride       LOS: 2 days    Roxan Hockey M.D on 08/02/2021 at 5:55 PM  Go to [email or web address] - for contact info  [facility] - Office  [phone]  If 7PM-7AM, please contact night-coverage [email or web address] Password [facility] 08/02/2021, 5:55 PM

## 2021-08-02 NOTE — TOC Initial Note (Signed)
Transition of Care [facility]) - Initial/Assessment Note    Patient Details  Name: Charlotte Henry MRN: [id number] Date of Birth: April 26, 1941  Transition of Care [facility]) CM/SW Contact:    Shade Flood, LCSW Phone Number: 08/02/2021, 2:21 PM  Clinical Narrative:                  Pt admitted from home with hip fracture. She is having surgery today. Anticipating PT eval tomorrow with recommendation for SNF rehab. TOC spoke with pt's family and CMS provider options were reviewed. Referred out for SNF bed. Insurance auth started. TOC will follow.  Expected Discharge Plan: Skilled Nursing Facility Barriers to Discharge: Continued Medical Work up   Patient Goals and CMS Choice Patient states their goals for this hospitalization and ongoing recovery are:: rehab CMS Medicare.gov Compare Post Acute Care list provided to:: Patient Represenative (must comment) Choice offered to / list presented to : Adult Children  Expected Discharge Plan and Services Expected Discharge Plan: [place] In-house Referral: Clinical Social Work   Post Acute Care Choice: [place] Living arrangements for the past 2 months: [place]                                      Prior Living Arrangements/Services Living arrangements for the past 2 months: Single Family Home Lives with:: Adult Children Patient language and need for interpreter reviewed:: Yes Do you feel safe going back to the place where you live?: Yes      Need for Family Participation in Patient Care: Yes (Comment) Care giver support system in place?: Yes (comment) Current home services: DME, Hospice Criminal Activity/Legal Involvement Pertinent to Current Situation/Hospitalization: No - Comment as needed  Activities of Daily Living Home Assistive Devices/Equipment: Walker (specify type) ADL Screening (condition at time of admission) Patient's cognitive ability adequate to safely complete daily  activities?: Yes Is the patient deaf or have difficulty hearing?: No Does the patient have difficulty seeing, even when wearing glasses/contacts?: No Does the patient have difficulty concentrating, remembering, or making decisions?: No Patient able to express need for assistance with ADLs?: Yes Does the patient have difficulty dressing or bathing?: Yes Independently performs ADLs?: No Communication: Independent Dressing (OT): Needs assistance Grooming: Needs assistance Feeding: Independent Bathing: Needs assistance Toileting: Needs assistance In/Out Bed: Needs assistance Walks in Home: Needs assistance Does the patient have difficulty walking or climbing stairs?: Yes Weakness of Legs: Both Weakness of Arms/Hands: None  Permission Sought/Granted Permission sought to share information with : Chartered certified accountant granted to share information with : Yes, Verbal Permission Granted     Permission granted to share info w AGENCY: snfs        Emotional Assessment       Orientation: : Oriented to Self, Oriented to Place, Oriented to  Time, Oriented to Situation Alcohol / Substance Use: Not Applicable Psych Involvement: No (comment)  Admission diagnosis:  Closed fracture of neck of right femur, initial encounter ([place]) [S72.001A] Closed displaced fracture of right femoral neck (HCC) [S72.001A] Chronic kidney disease, unspecified CKD stage [N18.9] Patient Active Problem List   Diagnosis Date Noted   Acute kidney injury superimposed on chronic kidney disease ([place]) 08/01/2021   Prolonged QT interval 08/01/2021   (HFpEF) heart failure with preserved ejection fraction ([place]) 08/01/2021   Dementia without behavioral disturbance ([place]) 08/01/2021   Closed displaced fracture of right femoral neck ([place])  07/31/2021   Bilateral lower extremity edema 12/03/2020   Hypertension associated with chronic kidney disease due to type 2 diabetes mellitus ([place]) 12/02/2020   Aortic  atherosclerosis ([place]) 12/02/2020   Type 2 diabetes mellitus with stage 3 chronic kidney disease, without long-term current use of insulin ([place]) 12/02/2020   Stage 3 chronic kidney disease due to type 2 diabetes mellitus ([place]) 12/02/2020   Hyperlipidemia associated with type 2 diabetes mellitus ([place]) 12/02/2020   Vascular dementia without behavioral disturbance ([place]) 12/02/2020   Major depression, recurrent, chronic ([place]) 12/02/2020   Bilateral cellulitis of lower leg 11/30/2020   Pressure injury of skin 11/16/2020   Hypotension 11/15/2020   Acute lower UTI 11/15/2020   Cellulitis 11/15/2020   Status cardiac pacemaker 11/15/2020   Multiple falls 11/15/2020   Iron deficiency anemia 04/28/2020   AAA (abdominal aortic aneurysm) 02/17/2019   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[place]) 01/10/2018   Itching 07/19/2017   Gait instability 02/26/2017   Symptomatic anemia 08/15/2016   Protein-calorie malnutrition ([place]) 08/04/2016   Normocytic anemia 07/03/2016   CHB (complete heart block) (HCC)    COPD (chronic obstructive pulmonary disease) ([place])    Tobacco use disorder 10/15/2015   OA (osteoarthritis) 12/30/2014   Carotid stenosis 03/11/2014   PVD (peripheral vascular disease) ([place]) 03/11/2014   Colon cancer screening 02/04/2014   Overactive bladder 12/31/2013   Rash and nonspecific skin eruption 05/19/2013   Peripheral neuropathy 07/01/2012   Chronic kidney disease (CKD), stage III (moderate) ([place]) 08/25/2010   Cardiovascular disease 08/16/2009   Cerebrovascular disease 08/16/2009   Peripheral vascular disease ([place]) 08/16/2009   Type 2 diabetes mellitus with diabetic neuropathy, unspecified ([place]) 10/26/2008   Hyperlipidemia 10/26/2008   Essential hypertension 10/26/2008   Asthmatic bronchitis 10/26/2008   GERD (gastroesophageal reflux disease) 10/26/2008   PCP:  Patient, No Pcp Per (Inactive) Pharmacy:   [facility], [place] [address] Phone: [phone] Fax: [phone]     Social Determinants of Health (SDOH) Interventions    Readmission Risk Interventions No flowsheet data found.

## 2021-08-03 DIAGNOSIS — N189 Chronic kidney disease, unspecified: Secondary | ICD-10-CM

## 2021-08-03 DIAGNOSIS — N179 Acute kidney failure, unspecified: Secondary | ICD-10-CM

## 2021-08-03 DIAGNOSIS — K219 Gastro-esophageal reflux disease without esophagitis: Secondary | ICD-10-CM

## 2021-08-03 DIAGNOSIS — I5030 Unspecified diastolic (congestive) heart failure: Secondary | ICD-10-CM

## 2021-08-03 DIAGNOSIS — R9431 Abnormal electrocardiogram [ECG] [EKG]: Secondary | ICD-10-CM

## 2021-08-03 DIAGNOSIS — F0390 Unspecified dementia without behavioral disturbance: Secondary | ICD-10-CM

## 2021-08-03 DIAGNOSIS — S72001A Fracture of unspecified part of neck of right femur, initial encounter for closed fracture: Principal | ICD-10-CM

## 2021-08-03 DIAGNOSIS — E782 Mixed hyperlipidemia: Secondary | ICD-10-CM

## 2021-08-03 LAB — CBC
HCT: 28.9 % — ABNORMAL LOW (ref 36.0–46.0)
Hemoglobin: 9 g/dL — ABNORMAL LOW (ref 12.0–15.0)
MCH: 29.7 pg (ref 26.0–34.0)
MCHC: 31.1 g/dL (ref 30.0–36.0)
MCV: 95.4 fL (ref 80.0–100.0)
Platelets: 186 10*3/uL (ref 150–400)
RBC: 3.03 MIL/uL — ABNORMAL LOW (ref 3.87–5.11)
RDW: 13.5 % (ref 11.5–15.5)
WBC: 8.6 10*3/uL (ref 4.0–10.5)
nRBC: 0 % (ref 0.0–0.2)

## 2021-08-03 LAB — BASIC METABOLIC PANEL
Anion gap: 9 (ref 5–15)
BUN: 23 mg/dL (ref 8–23)
CO2: 21 mmol/L — ABNORMAL LOW (ref 22–32)
Calcium: 8.5 mg/dL — ABNORMAL LOW (ref 8.9–10.3)
Chloride: 109 mmol/L (ref 98–111)
Creatinine, Ser: 1.4 mg/dL — ABNORMAL HIGH (ref 0.44–1.00)
GFR, Estimated: 38 mL/min — ABNORMAL LOW (ref >60)
Glucose, Bld: 92 mg/dL (ref 70–99)
Potassium: 3.9 mmol/L (ref 3.5–5.1)
Sodium: 139 mmol/L (ref 135–145)

## 2021-08-03 MED ORDER — FUROSEMIDE 20 MG PO TABS: 20.0000 mg | ORAL_TABLET | Freq: Every day | ORAL | Status: AC | PRN

## 2021-08-03 MED ORDER — SENNOSIDES-DOCUSATE SODIUM 8.6-50 MG PO TABS: 2.0000 | ORAL_TABLET | Freq: Two times a day (BID) | ORAL | Status: AC

## 2021-08-03 MED ORDER — TRAZODONE HCL 50 MG PO TABS: 25.0000 mg | ORAL_TABLET | Freq: Two times a day (BID) | ORAL | Status: AC

## 2021-08-03 MED ORDER — OMEPRAZOLE 20 MG PO CPDR: 20.0000 mg | DELAYED_RELEASE_CAPSULE | Freq: Two times a day (BID) | ORAL | Status: AC

## 2021-08-03 MED ORDER — OXYCODONE HCL 5 MG PO TABS
5.0000 mg | ORAL_TABLET | Freq: Four times a day (QID) | ORAL | 0 refills | Status: AC | PRN
Start: 2021-08-03 — End: ?

## 2021-08-03 MED ORDER — ASPIRIN EC 81 MG PO TBEC: 81.0000 mg | DELAYED_RELEASE_TABLET | Freq: Two times a day (BID) | ORAL | Status: AC

## 2021-08-03 NOTE — Evaluation (Signed)
Occupational Therapy Evaluation Patient Details Name: Charlotte Henry MRN: [id number] DOB: Mar 05, 1941 Today's Date: 08/03/2021   History of Present Illness Charlotte Henry is a 81 y.o. female with medical history significant of  hypertension, peripheral vascular disease, CKD 3, COPD, pacemaker status who presented to the ED with complaints of right hip pain due to a fall sustained at home.  Patient states she was walking towards her bathroom from the bedroom with her walker when she lost balance and fell landing on her right side.  She complained of right hip pain with difficulty in being able to bear weight on right leg. There was no loss of consciousness or head injury EMS was activated and patient was taken to the ED for further evaluation and management. Status post CRPP of the Right femoral neck fracture.   Clinical Impression   Pt agreeable to OT and PT co-evaluation. Daughter arrived to session and provided information on prior function. Pt demonstrates need for mod to max A for bed mobility as well as step pivot transfer with RW. Pt is limited in B UE shoulder A/ROM at baseline and assisted for most ADL's at baseline. Family reports note feeling capable of assisting pt with current level of assist needed for transfers at this time. Pt will benefit from continued OT in the hospital and recommended venue below to increase strength, balance, and endurance for safe ADL's.        Recommendations for follow up therapy are one component of a multi-disciplinary discharge planning process, led by the attending physician.  Recommendations may be updated based on patient status, additional functional criteria and insurance authorization.   Follow Up Recommendations  Skilled nursing-short term rehab (<3 hours/day)    Assistance Recommended at Discharge Frequent or constant Supervision/Assistance  Patient can return home with the following A lot of help with walking and/or transfers;A lot of help with  bathing/dressing/bathroom;Assistance with cooking/housework;Assist for transportation;Help with stairs or ramp for entrance    Functional Status Assessment  Patient has had a recent decline in their functional status and demonstrates the ability to make significant improvements in function in a reasonable and predictable amount of time.  Equipment Recommendations  None recommended by OT    Recommendations for Other Services       Precautions / Restrictions Precautions Precautions: Fall Precaution Comments: status post CRPP of the Right femoral neck fracture Restrictions Weight Bearing Restrictions: Yes RLE Weight Bearing: Weight bearing as tolerated      Mobility Bed Mobility Overal bed mobility: Needs Assistance Bed Mobility: Supine to Sit     Supine to sit: Mod assist, Max assist     General bed mobility comments: increased time, labored movement    Transfers Overall transfer level: Needs assistance Equipment used: Rolling walker (2 wheels) Transfers: Sit to/from Stand, Bed to chair/wheelchair/BSC Sit to Stand: Mod assist, Max assist     Step pivot transfers: Mod assist, Max assist     General transfer comment: unsteady labored movement with poor tolerance for weightbearing on RLE due to increased pain      Balance Overall balance assessment: Needs assistance Sitting-balance support: Feet supported, No upper extremity supported Sitting balance-Leahy Scale: Fair Sitting balance - Comments: seated at EOB   Standing balance support: During functional activity, Reliant on assistive device for balance, Bilateral upper extremity supported Standing balance-Leahy Scale: Poor Standing balance comment: using RW  ADL either performed or assessed with clinical judgement   ADL Overall ADL's : Needs assistance/impaired Eating/Feeding: Modified independent;Sitting   Grooming: Minimal assistance;Sitting           Upper Body  Dressing : Minimal assistance;Sitting;Moderate assistance   Lower Body Dressing: Maximal assistance;Sitting/lateral leans   Toilet Transfer: Moderate assistance;Maximal assistance;Stand-pivot;Rolling walker (2 wheels) Toilet Transfer Details (indicate cue type and reason): Simulated via EOB to chair transfer. 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.) Vision Assessment?: Yes Tracking/Visual Pursuits: Other (comment) (Difficulty with vertical tracking in superior field of view.)     Perception     Praxis      Pertinent Vitals/Pain Pain Assessment Pain Assessment: Faces Faces Pain Scale: Hurts even more Pain Location: R hip Pain Descriptors / Indicators: Grimacing, Guarding Pain Intervention(s): Limited activity within patient's tolerance, Monitored during session, Repositioned, Premedicated before session     Hand Dominance Right   Extremity/Trunk Assessment Upper Extremity Assessment Upper Extremity Assessment: RUE deficits/detail RUE Deficits / Details: 2+/5 shoulder MMT. Generally weak for elbow and grip. P/ROM of shouler flexion and abduction to ~50% of available range of motion. RUE Sensation: WNL RUE Coordination: WNL LUE Deficits / Details: 2+/5 shoulder MMT. Generally weak for elbow and grip. WFL shoulder abduction P/ROM. limited to ~75% available range for shoulder flexion P/ROM. LUE Sensation: WNL LUE Coordination: WNL   Lower Extremity Assessment Lower Extremity Assessment: Defer to PT evaluation RLE Deficits / Details: grossly -3/5 RLE: Unable to fully assess due to pain RLE Sensation: WNL RLE Coordination: WNL   Cervical / Trunk  Assessment Cervical / Trunk Assessment: Normal   Communication Communication Communication: No difficulties   Cognition Arousal/Alertness: Awake/alert Behavior During Therapy: WFL for tasks assessed/performed Overall Cognitive Status: Within Functional Limits for tasks assessed                                                        Home Living Family/patient expects to be discharged to:: Private residence Living Arrangements: Alone Available Help at Discharge: Family;Available PRN/intermittently;Other (Comment) (Hospice) Type of Home: House Home Access: Stairs to enter [employer] of Steps: 2 Entrance Stairs-Rails: None Home Layout: One level     Bathroom Shower/Tub: Teacher, early years/pre: Handicapped height Bathroom Accessibility: Yes   Home Equipment: Conservation officer, nature (2 wheels);Cane - single point;Toilet riser;Grab bars - tub/shower;BSC/3in1;Hospital bed   Additional Comments: Pt and family reported no change in prior home living since last admission.      Prior Functioning/Environment Prior Level of Function : Needs assist       Physical Assist : Mobility (physical);ADLs (physical) Mobility (physical): Bed mobility;Transfers;Stairs;Gait ADLs (physical): Grooming;Bathing;Dressing;Toileting;IADLs Mobility Comments: Pt requires assist from family and use of RW for transfers within the household. ADLs Comments: Pt requires assist from family for all ADL's other than feeding. Assisted for IADL's.        OT Problem List: Decreased strength;Decreased range of motion;Decreased activity tolerance;Impaired balance (sitting and/or standing);Decreased coordination      OT Treatment/Interventions: Self-care/ADL training;Therapeutic exercise;DME and/or  AE instruction;Energy conservation;Therapeutic activities;Patient/family education;Balance training    OT Goals(Current goals can be found in the care plan section) Acute Rehab OT  Goals Patient Stated Goal: Family reports being open to rehab. OT Goal Formulation: With patient/family Time For Goal Achievement: 08/17/21 Potential to Achieve Goals: Fair  OT Frequency: Min 2X/week    Co-evaluation PT/OT/SLP Co-Evaluation/Treatment: Yes Reason for Co-Treatment: Complexity of the patient's impairments (multi-system involvement) PT goals addressed during session: Mobility/safety with mobility;Balance;Proper use of DME OT goals addressed during session: ADL's and self-care                       End of Session Equipment Utilized During Treatment: Rolling walker (2 wheels)  Activity Tolerance: Patient tolerated treatment well Patient left: in chair;with call bell/phone within reach;with family/visitor present  OT Visit Diagnosis: Unsteadiness on feet (R26.81);Other abnormalities of gait and mobility (R26.89);Muscle weakness (generalized) (M62.81);History of falling (Z91.81)                Time: 9604-5409 OT Time Calculation (min): 23 min Charges:  OT General Charges $OT Visit: 1 Visit OT Evaluation $OT Eval Moderate Complexity: 1 Mod  Nikyah Lackman OT, MOT  Larey Seat 08/03/2021, 12:29 PM

## 2021-08-03 NOTE — Plan of Care (Signed)
?  Problem: Acute Rehab OT Goals (only OT should resolve) ?Goal: Pt. Will Perform Grooming ?Flowsheets (Taken 08/03/2021 1232) ?Pt Will Perform Grooming: ? with supervision ? with adaptive equipment ? sitting ?Goal: Pt. Will Perform Upper Body Dressing ?Flowsheets (Taken 08/03/2021 1232) ?Pt Will Perform Upper Body Dressing: ? with supervision ? sitting ? with adaptive equipment ?Goal: Pt. Will Perform Lower Body Dressing ?Flowsheets (Taken 08/03/2021 1232) ?Pt Will Perform Lower Body Dressing: ? with mod assist ? sitting/lateral leans ? with adaptive equipment ?Goal: Pt. Will Transfer To Toilet ?Flowsheets (Taken 08/03/2021 1232) ?Pt Will Transfer to Toilet: ? with min assist ? bedside commode ? stand pivot transfer ?Goal: Pt. Will Perform Toileting-Clothing Manipulation ?Flowsheets (Taken 08/03/2021 1232) ?Pt Will Perform Toileting - Clothing Manipulation and hygiene: ? with min assist ? sitting/lateral leans ?Goal: Pt/Caregiver Will Perform Home Exercise Program ?Flowsheets (Taken 08/03/2021 1232) ?Pt/caregiver will Perform Home Exercise Program: ? Increased ROM ? Increased strength ? Both right and left upper extremity ? With minimal assist ? Lilinoe Acklin OT, MOT ? ?

## 2021-08-03 NOTE — Progress Notes (Signed)
Pt awake most of the shift. Unable to get comfortable despite hourly rounding with turning and repositioning, temperature changes, extra linen, pain medications, fluids. Requiring pain medication approximately every 2 hours, alternating between PO and IV doses of pain medication-with either minimal relief or pt noted to be resting with eyes closed upon reassessment. Frequent requests for staff assist, pt requesting to get out of bed, however pt unable to tolerate staff moving leg for transfers.  ?

## 2021-08-03 NOTE — Discharge Summary (Addendum)
Physician Discharge Summary   Patient: Charlotte Henry MRN: [id number] DOB: [birth year]  Admit date:     07/31/2021  Discharge date: 08/04/21  Discharge Physician: Barton Dubois   PCP: Patient, No Pcp Per (Inactive)   Recommendations at discharge:  Maintain adequate hydration Follow-up with orthopedic service as instructed Weightbearing as tolerated Baby aspirin twice a day for DVT prophylaxis as recommended by orthopedic therapy. Rehabilitation/physical therapy as per the skilled nursing facility protocol Repeat CBC and basic metabolic panel in 5 days to follow hemoglobin and stability, electrolytes and renal function trend.  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 Josephine Cables on 07/31/2021 Charlotte Henry is a 81 y.o. female with medical history significant of  hypertension, peripheral vascular disease, CKD 3, COPD, pacemaker status who presented to the ED with complaints of right hip pain due to a fall sustained at home.  Patient states she was walking towards her bathroom from the bedroom with her walker when she lost balance and fell landing on her right side.  She complained of right hip pain with difficulty in being able to bear weight on right leg. There was no loss of consciousness or head injury EMS was activated and patient was taken to the ED for further evaluation and management.  ED course: In the emergency department, she was hemodynamically stable, BP was 144/58.  Lab work-up showed normocytic anemia, BUN/creatinine 44/2.19 (baseline creatinine at 1.1-1.5).  Influenza A, B, SARS coronavirus 2 was negative. Right hip  x-ray showed right femoral neck fracture Patient was treated with IV fentanyl. Orthopedic surgeon (Dr. Amedeo Kinsman) was consulted and recommended admitting patient with plan to see patient in the morning per ED physician.  Hospitalist was asked to admit patient for further evaluation and management.  Assessment and Plan: * Closed displaced fracture of right femoral neck ([place])- (present on admission) -Right hip x-ray showed right femoral neck fracture -Patient had a fall at home -Status post ORIF of patient's right hip to stabilize fracture. -Overall well-tolerated -Weightbearing as tolerated as per orthopedic service instructions -Baby aspirin twice a day for DVT prophylaxis -Continue as needed pain medication -Follow-up in 2 weeks with orthopedic service; reinforce dressing as needed and trim stitches at 2 weeks mark if unable to arrange follow-up with Dr. Amedeo Kinsman. -Patient has been discharged to skilled nursing facility for further care and rehabilitation. -Repeat CBC in 5 days to follow-up hemoglobin and stability.   Dementia without behavioral disturbance ([place]) -Resume the use of Aricept and Seroquel -Consult reorientation recommended.  -Currently with a stable mood.   (HFpEF) heart failure with preserved ejection fraction (HCC) -Chronic and compensated -Patient has history of chronic diastolic dysfunction; Echo done on 11/18/2020 showed LV EF of 65 to 70% with grade 1 diastolic dysfunction. -Continue to follow adequate sodium intake and hydration -Check weight on daily basis. -Resume as needed use of Lasix for fluid buildup, shortness of breath and edema.   Prolonged QT interval -QTc 511 ms, patient has a paced rhythm -Continue to maintain stable electrolytes level (specially magnesium and potassium; goal is for potassium above 4 and magnesium above 2). -Maintain adequate hydration -Minimize the use of medications that can prolong QT.  Acute kidney injury superimposed on chronic  kidney  disease ([place]) -BUN/creatinine 44/2.19 (baseline creatinine at 1.1-1.5). -After fluid resuscitation and minimizing nephrotoxic agents patient's creatinine 1.4 -Continue to Renally adjust medications and avoid nephrotoxic agents/dehydration/hypotension. -Maintain adequate hydration -Repeat basic metabolic panel in 5 days to follow electrolytes and renal function and stability. -Patient with chronic kidney disease a stage IIIb as already mentioned.   Chronic kidney disease -Stage IIIb chronic kidney disease -Improved after fluid resuscitation and back to baseline. -Continue to follow renal function trend and minimize the use of nephrotoxic agents. -Patient advised to maintain adequate hydration.  GERD (gastroesophageal reflux disease) -Continue PPI -Dose adjusted to twice a day for better GI protection while using aspirin BID for DVT prophylaxis.  Cardiovascular disease- (present on admission) - No chest pain or shortness of breath -Continue risk factor modification.  Essential hypertension- (present on admission) -Stable and well-controlled. -Continue to follow vital signs.  Hyperlipidemia- (present on admission) -Continue Zetia   Consultants: Orthopedic service Procedures performed: Status post ORIF of her right hip Disposition: Discharge to skilled nursing facility for further care and rehabilitation. Diet recommendation:  Regular diet consistency; maintain adequate hydration and watch sodium intake.  DISCHARGE MEDICATION: Allergies as of 08/04/2021       Reactions   Ace Inhibitors Hives, Shortness Of Breath, Swelling   Aspirin Other (See Comments)   Blood in stool and nose bleed   Codeine Swelling   Pineapple Shortness Of Breath, Swelling   Plasticized Base [plastibase] Hives, Swelling   No plastics   Ultram [tramadol] Swelling   Adhesive [tape] Swelling, Rash   Naprosyn [naproxen] Swelling   Biaxin [clarithromycin] Nausea And Vomiting, Other (See Comments)    Dizziness.   Clindamycin/lincomycin Other (See Comments)   Burning sensation throat, abdomen   Linzess [linaclotide]    DIARRHEA, FECAL INCONTINENCE   Metronidazole    NAUSEA AND WEAKNESS   Nsaids    Penicillins    Lip swelling, Hives Has patient had a PCN reaction causing immediate rash, facial/tongue/throat swelling, SOB or lightheadedness with hypotension:YES Has patient had a PCN reaction causing severe rash involving mucus membranes or skin necrosis: NO Has patient had a PCN reaction that required hospitalization NO Has patient had a PCN reaction occurring within the last 10 years: NO If all of the above answers are "NO", then may proceed with Cephalosporin use.   Varenicline Tartrate Swelling   Xarelto [rivaroxaban]    Bleeding        Medication List     STOP taking these medications    clotrimazole 1 % cream Commonly known as: LOTRIMIN   haloperidol 2 MG tablet Commonly known as: HALDOL   haloperidol 5 MG tablet Commonly known as: HALDOL   nystatin powder Commonly known as: MYCOSTATIN/NYSTOP   ondansetron 4 MG tablet Commonly known as: ZOFRAN       TAKE these medications    acetaminophen 325 MG tablet Commonly known as: TYLENOL Take 2 tablets (650 mg total) by mouth every 6 (six) hours as needed for mild pain (or Fever >/= 101).   albuterol 108 (90 Base) MCG/ACT inhaler Commonly known as: VENTOLIN HFA Inhale 2 puffs into the lungs every 4 (four) hours as needed for wheezing or shortness of breath.   aspirin EC 81 MG tablet Take 1 tablet (81 mg total) by mouth in the morning and at bedtime. Swallow whole.   bisacodyl 10 MG suppository Commonly known as: DULCOLAX Place 1 suppository (10 mg total) rectally daily as needed for moderate constipation.   calcium citrate-vitamin D 315-200 MG-UNIT  tablet Commonly known as: CITRACAL+D Take 1 tablet by mouth daily.   donepezil 5 MG tablet Commonly known as: Aricept Take 1 tablet (5 mg total) by mouth at  bedtime.   escitalopram 10 MG tablet Commonly known as: LEXAPRO Take 1 tablet (10 mg total) by mouth daily.   ezetimibe 10 MG tablet Commonly known as: ZETIA Take 1 tablet (10 mg total) by mouth daily.   ferrous sulfate 325 (65 FE) MG tablet Take 1 tablet (325 mg total) by mouth daily with breakfast.   furosemide 20 MG tablet Commonly known as: LASIX Take 1 tablet (20 mg total) by mouth daily as needed for fluid or edema.   Lidocaine 4 % Ptch Apply topically in the morning and at bedtime. apply to skin in AM and remove in PM   multivitamin with minerals Tabs tablet Take 1 tablet by mouth daily.   omeprazole 20 MG capsule Commonly known as: PRILOSEC Take 1 capsule (20 mg total) by mouth 2 (two) times daily before a meal. What changed: when to take this   oxyCODONE 5 MG immediate release tablet Commonly known as: Oxy IR/ROXICODONE Take 1 tablet (5 mg total) by mouth every 6 (six) hours as needed for severe pain.   senna-docusate 8.6-50 MG tablet Commonly known as: Senokot-S Take 2 tablets by mouth 2 (two) times daily.   traZODone 50 MG tablet Commonly known as: DESYREL Take 0.5 tablets (25 mg total) by mouth 2 (two) times daily.   VITAMIN B 12 PO Take 250 mcg by mouth daily.               Discharge Care Instructions  (From admission, onward)           Start     Ordered   08/03/21 0000  Discharge wound care:       Comments: Keep area clean and dry; keep dressing in place until follow-up visit with orthopedic service.   08/03/21 1540            Follow-up Information     [name], MD. Schedule an appointment as soon as possible for a visit in 10 day(s).   Specialty: Cardiology Why: After discharge from the skilled nursing facility. Contact information: [place] [address] [phone]         Herminio Commons, MD .   Specialty: Cardiology Contact information: [place] [address] [phone]          Mordecai Rasmussen, MD. Schedule an appointment as soon as possible for a visit in 2 week(s).   Specialties: Orthopedic Surgery, Sports Medicine Contact information: [place]. [address] [place] [address] [phone]                 Discharge Exam: Danley Danker Weights   07/31/21 1951 07/31/21 2358 08/02/21 1115  Weight: 65 kg 58.5 kg 58.5 kg   General exam: Alert, awake, following commands appropriately and in no acute distress.  Patient is afebrile.  Good saturation on room air. Respiratory system: Clear to auscultation.  No using accessory muscles.  Normal respiratory effort. Cardiovascular system:RRR. No murmurs, rubs, gallops.  No JVD. Gastrointestinal system: Abdomen is nondistended, soft and nontender. No organomegaly or masses felt. Normal bowel sounds heard. Central nervous system: No focal neurological deficits. Extremities: No cyanosis or clubbing. Skin: No petechiae. Psychiatry: Mood & affect appropriate.    Condition at discharge: improving  The results of significant diagnostics from this hospitalization (including imaging,  microbiology, ancillary and laboratory) are listed below for reference.   Imaging Studies: DG Lumbar Spine Complete  Result Date: 07/31/2021 CLINICAL DATA:  Fall.  Back pain and right hip pain. EXAM: LUMBAR SPINE - COMPLETE 4+ VIEW COMPARISON:  CT 07/09/2018 FINDINGS: Study degraded by artifact on the lateral views. No evidence of fracture from the lower thoracic region to the sacrum. Chronic lower lumbar disc space narrowing with degenerative anterolisthesis at L4-5 of about 4 mm. Regional arterial calcification incidentally noted. IMPRESSION: No acute fracture seen. Lower lumbar degenerative changes. Lateral views suffer from overlying artifact. Electronically Signed   By: Nelson Chimes M.D.   On: 07/31/2021 22:25   DG Pelvis Portable  Result Date: 08/02/2021 CLINICAL DATA:  Postop right hip ORIF EXAM: PORTABLE PELVIS 1-2 VIEWS COMPARISON:   None. FINDINGS: Right femoral neck fracture transfixed with 3 cannulated femoral neck screws without failure or complication. No other fracture or dislocation. No aggressive osseous lesion. Postsurgical changes overlying the right greater trochanter. Peripheral vascular atherosclerotic disease. IMPRESSION: 1. Interval ORIF right femoral neck fracture. Electronically Signed   By: Kathreen Devoid M.D.   On: 08/02/2021 14:36   DG C-Arm 1-60 Min-No Report  Result Date: 08/02/2021 Fluoroscopy was utilized by the requesting physician.  No radiographic interpretation.   DG C-Arm 1-60 Min-No Report  Result Date: 08/02/2021 Fluoroscopy was utilized by the requesting physician.  No radiographic interpretation.   CUP PACEART REMOTE DEVICE CHECK  Result Date: 07/12/2021 Scheduled remote reviewed. Normal device function.  Next remote 91 days. [place]  DG HIP UNILAT WITH PELVIS 2-3 VIEWS RIGHT  Result Date: 08/02/2021 CLINICAL DATA:  Right hip pinning EXAM: DG HIP (WITH OR WITHOUT PELVIS) 2-3V RIGHT COMPARISON:  07/31/2021 FINDINGS: C-arm images of the right hip retained. 3 screws are present across the femoral neck into the femoral head. Satisfactory hardware placement. IMPRESSION: Satisfactory pinning of right femoral neck fracture. Electronically Signed   By: Franchot Gallo M.D.   On: 08/02/2021 14:10   DG Hip Unilat  With Pelvis 2-3 Views Right  Result Date: 07/31/2021 CLINICAL DATA:  [place] with hip pain EXAM: DG HIP (WITH OR WITHOUT PELVIS) 2-3V RIGHT COMPARISON:  03/04/2021 FINDINGS: Femoral neck fracture on the right. Bones of the pelvis appear unremarkable. Left hip appears normal. IMPRESSION: Right femoral neck fracture. Electronically Signed   By: Nelson Chimes M.D.   On: 07/31/2021 22:23    Microbiology: Results for orders placed or performed during the hospital encounter of 07/31/21  Resp Panel by RT-PCR (Flu A&B, Covid) Nasopharyngeal Swab     Status: None   Collection Time: 07/31/21 11:10 PM    Specimen: Nasopharyngeal Swab; Nasopharyngeal(NP) swabs in vial transport medium  Result Value Ref Range Status   SARS Coronavirus 2 by RT PCR NEGATIVE NEGATIVE Final    Comment: (NOTE) SARS-CoV-2 target nucleic acids are NOT DETECTED.  The SARS-CoV-2 RNA is generally detectable in upper respiratory specimens during the acute phase of infection. The lowest concentration of SARS-CoV-2 viral copies this assay can detect is 138 copies/mL. A negative result does not preclude SARS-Cov-2 infection and should not be used as the sole basis for treatment or other patient management decisions. A negative result may occur with  improper specimen collection/handling, submission of specimen other than nasopharyngeal swab, presence of viral mutation(s) within the areas targeted by this assay, and inadequate number of viral copies(<138 copies/mL). A negative result must be combined with clinical observations, patient history, and epidemiological information. The expected result is Negative.  Fact  Sheet for Patients:  EntrepreneurPulse.com.au  Fact Sheet for Healthcare Providers:  IncredibleEmployment.be  This test is no t yet approved or cleared by the [place] FDA and  has been authorized for detection and/or diagnosis of SARS-CoV-2 by FDA under an Emergency Use Authorization (EUA). This EUA will remain  in effect (meaning this test can be used) for the duration of the COVID-19 declaration under Section 564(b)(1) of the Act, 21 U.S.C.section 360bbb-3(b)(1), unless the authorization is terminated  or revoked sooner.       Influenza A by PCR NEGATIVE NEGATIVE Final   Influenza B by PCR NEGATIVE NEGATIVE Final    Comment: (NOTE) The Xpert Xpress SARS-CoV-2/FLU/RSV plus assay is intended as an aid in the diagnosis of influenza from Nasopharyngeal swab specimens and should not be used as a sole basis for treatment. Nasal washings and aspirates are  unacceptable for Xpert Xpress SARS-CoV-2/FLU/RSV testing.  Fact Sheet for Patients: EntrepreneurPulse.com.au  Fact Sheet for Healthcare Providers: IncredibleEmployment.be  This test is not yet approved or cleared by the [place] FDA and has been authorized for detection and/or diagnosis of SARS-CoV-2 by FDA under an Emergency Use Authorization (EUA). This EUA will remain in effect (meaning this test can be used) for the duration of the COVID-19 declaration under Section 564(b)(1) of the Act, 21 U.S.C. section 360bbb-3(b)(1), unless the authorization is terminated or revoked.  Performed at [facility], [address] [place], [place] [address]   Surgical PCR screen     Status: None   Collection Time: 08/01/21  5:14 AM   Specimen: Nasal Mucosa; Nasal Swab  Result Value Ref Range Status   MRSA, PCR NEGATIVE NEGATIVE Final   Staphylococcus aureus NEGATIVE NEGATIVE Final    Comment: (NOTE) The Xpert SA Assay (FDA approved for NASAL specimens in patients 7 years of age and older), is one component of a comprehensive surveillance program. It is not intended to diagnose infection nor to guide or monitor treatment. Performed at [facility], [address] [place], [place] [address]     Labs: CBC: Recent Labs  Lab 07/31/21 2054 08/01/21 0523 08/02/21 0530 08/03/21 0530  WBC 8.1 12.2* 6.9 8.6  NEUTROABS 4.7  --   --   --   HGB 10.9* 9.4* 9.2* 9.0*  HCT 32.7* 28.3* 29.6* 28.9*  MCV 92.9 93.1 94.3 95.4  PLT 199 177 173 973   Basic Metabolic Panel: Recent Labs  Lab 07/31/21 2054 08/01/21 0523 08/02/21 0530 08/03/21 0530  NA 139 139 140 139  K 4.5 4.3 4.1 3.9  CL 104 107 111 109  CO2 27 24 20* 21*  GLUCOSE 125* 137* 75 92  BUN 44* 42* 31* 23  CREATININE 2.19* 1.96* 1.65* 1.40*  CALCIUM 9.5 9.1 8.5* 8.5*  MG  --  2.1  --   --   PHOS  --  3.2  --   --    Liver Function Tests: Recent Labs  Lab 08/01/21 0523  AST 21  ALT  12  ALKPHOS 45  BILITOT 0.5  PROT 6.7  ALBUMIN 3.7    Discharge time spent: greater than 30 minutes.  Signed: Barton Dubois, MD [facility] 08/03/2021

## 2021-08-03 NOTE — Evaluation (Addendum)
Physical Therapy Evaluation Patient Details Name: DECHELLE ATTAWAY MRN: [id number] DOB: 1940/10/04 Today's Date: 08/03/2021  History of Present Illness  Charlotte Henry is a 81 y.o. female with medical history significant of  hypertension, peripheral vascular disease, CKD 3, COPD, pacemaker status who presented to the ED with complaints of right hip pain due to a fall sustained at home.  Patient states she was walking towards her bathroom from the bedroom with her walker when she lost balance and fell landing on her right side.  She complained of right hip pain with difficulty in being able to bear weight on right leg. There was no loss of consciousness or head injury EMS was activated and patient was taken to the ED for further evaluation and management. Status post CRPP of the Right femoral neck fracture.   Clinical Impression  Patient demonstrates slow labored movement for sitting up at bedside with difficulty moving legs due to increased right hip pain, very unsteady on feet with poor tolerance for weightbearing on RLE and limited to a few shuffling side steps during transfer to chair.  Patient tolerated sitting up in chair after therapy with her daughter present in room.  Patient will benefit from continued skilled physical therapy in hospital and recommended venue below to increase strength, balance, endurance for safe ADLs and gait.          Recommendations for follow up therapy are one component of a multi-disciplinary discharge planning process, led by the attending physician.  Recommendations may be updated based on patient status, additional functional criteria and insurance authorization.  Follow Up Recommendations Skilled nursing-short term rehab (<3 hours/day)    Assistance Recommended at Discharge Intermittent Supervision/Assistance  Patient can return home with the following  A lot of help with bathing/dressing/bathroom;A lot of help with walking and/or transfers;Help with stairs or  ramp for entrance;Assistance with cooking/housework    Equipment Recommendations None recommended by PT  Recommendations for Other Services       Functional Status Assessment Patient has had a recent decline in their functional status and demonstrates the ability to make significant improvements in function in a reasonable and predictable amount of time.     Precautions / Restrictions Precautions Precautions: Fall Precaution Comments: status post CRPP of the Right femoral neck fracture Restrictions Weight Bearing Restrictions: Yes RLE Weight Bearing: Weight bearing as tolerated      Mobility  Bed Mobility Overal bed mobility: Needs Assistance Bed Mobility: Supine to Sit     Supine to sit: Mod assist, Max assist     General bed mobility comments: increased time, labored movement    Transfers Overall transfer level: Needs assistance Equipment used: Rolling walker (2 wheels) Transfers: Sit to/from Stand, Bed to chair/wheelchair/BSC Sit to Stand: Mod assist, Max assist   Step pivot transfers: Mod assist, Max assist       General transfer comment: unsteady labored movement with poor tolerance for weightbearing on RLE due to increased pain    Ambulation/Gait Ambulation/Gait assistance: Max assist Gait Distance (Feet): 4 Feet Assistive device: Rolling walker (2 wheels) Gait Pattern/deviations: Decreased step length - right, Decreased step length - left, Decreased stance time - right, Decreased stride length, Antalgic, Shuffle Gait velocity: slow     General Gait Details: limited to a few unsteady labored shuffling side steps due to increased right hip pain and generalized weakness  Stairs            Wheelchair Mobility    Modified Rankin (Stroke Patients Only)  Balance Overall balance assessment: Needs assistance Sitting-balance support: Feet supported, No upper extremity supported Sitting balance-Leahy Scale: Fair Sitting balance - Comments:  seated at EOB   Standing balance support: During functional activity, Reliant on assistive device for balance, Bilateral upper extremity supported Standing balance-Leahy Scale: Poor Standing balance comment: using RW                             Pertinent Vitals/Pain Pain Assessment Pain Assessment: Faces Faces Pain Scale: Hurts even more Pain Location: R hip Pain Descriptors / Indicators: Grimacing, Guarding Pain Intervention(s): Limited activity within patient's tolerance, Monitored during session, Premedicated before session, Repositioned    Home Living Family/patient expects to be discharged to:: Private residence Living Arrangements: Alone Available Help at Discharge: Family;Available PRN/intermittently;Other (Comment) Type of Home: House Home Access: Stairs to enter Entrance Stairs-Rails: None Entrance Stairs-Number of Steps: 2   Home Layout: One level Home Equipment: Conservation officer, nature (2 wheels);Cane - single point;Toilet riser;Grab bars - tub/shower;BSC/3in1;Hospital bed Additional Comments: Pt and family reported no change in prior home living since last admission.    Prior Function Prior Level of Function : Needs assist       Physical Assist : Mobility (physical);ADLs (physical) Mobility (physical): Bed mobility;Transfers;Stairs;Gait ADLs (physical): Grooming;Bathing;Dressing;Toileting;IADLs Mobility Comments: assisted short household distances and transfers using RW ADLs Comments: Pt requires assist from family for all ADL's other than feeding. Assisted for IADL's.     Hand Dominance   Dominant Hand: Right    Extremity/Trunk Assessment   Upper Extremity Assessment Upper Extremity Assessment: Defer to OT evaluation RUE Deficits / Details: 2+/5 shoulder MMT. Generally weak for elbow and grip. P/ROM of shouler flexino and abduction to ~50% of available range of motion.    Lower Extremity Assessment Lower Extremity Assessment: Generalized weakness;RLE  deficits/detail RLE Deficits / Details: grossly -3/5 RLE: Unable to fully assess due to pain RLE Sensation: WNL RLE Coordination: WNL    Cervical / Trunk Assessment Cervical / Trunk Assessment: Normal  Communication   Communication: No difficulties  Cognition Arousal/Alertness: Awake/alert Behavior During Therapy: WFL for tasks assessed/performed Overall Cognitive Status: Within Functional Limits for tasks assessed                                          General Comments      Exercises     Assessment/Plan    PT Assessment Patient needs continued PT services  PT Problem List Decreased strength;Decreased activity tolerance;Decreased balance;Decreased mobility       PT Treatment Interventions DME instruction;Gait training;Stair training;Functional mobility training;Therapeutic activities;Therapeutic exercise;Patient/family education;Balance training    PT Goals (Current goals can be found in the Care Plan section)  Acute Rehab PT Goals Patient Stated Goal: return home after rehab PT Goal Formulation: With patient/family Time For Goal Achievement: 08/17/21 Potential to Achieve Goals: Good    Frequency Min 4X/week     Co-evaluation PT/OT/SLP Co-Evaluation/Treatment: Yes Reason for Co-Treatment: To address functional/ADL transfers PT goals addressed during session: Mobility/safety with mobility;Balance;Proper use of DME OT goals addressed during session: ADL's and self-care       AM-PAC PT "6 Clicks" Mobility  Outcome Measure Help needed turning from your back to your side while in a flat bed without using bedrails?: A Lot Help needed moving from lying on your back to sitting on the side of a flat  bed without using bedrails?: A Lot Help needed moving to and from a bed to a chair (including a wheelchair)?: A Lot Help needed standing up from a chair using your arms (e.g., wheelchair or bedside chair)?: A Lot Help needed to walk in hospital room?: A  Lot Help needed climbing 3-5 steps with a railing? : Total 6 Click Score: 11    End of Session   Activity Tolerance: Patient tolerated treatment well;Patient limited by fatigue Patient left: in chair;with call bell/phone within reach Nurse Communication: Mobility status PT Visit Diagnosis: Unsteadiness on feet (R26.81);Other abnormalities of gait and mobility (R26.89);Muscle weakness (generalized) (M62.81)    Time: 4276-7011 PT Time Calculation (min) (ACUTE ONLY): 20 min   Charges:   PT Evaluation $PT Eval Moderate Complexity: 1 Mod PT Treatments $Therapeutic Activity: 8-22 mins        12:02 PM, 08/03/21 Lonell Grandchild, MPT Physical Therapist with [facility] [phone] office [phone] mobile phone

## 2021-08-03 NOTE — Progress Notes (Signed)
? ?  ORTHOPAEDIC PROGRESS NOTE ? ?S/p Right Hip CRPP ? ?DOS: 08/02/21 ? ?SUBJECTIVE: ?Issues with pain control over night.  Looks comfortable this morning.  No family at bedside ? ?OBJECTIVE: ?PE: ? ?Sleeping, but wakes up ? ?Lateral hip dressing is clean, dry and intact ?Does not react when palpate the hip  ?Toes are warm and well perfused ? ?Vitals:  ? 08/03/21 0328 08/03/21 0606  ?BP: (!) 144/65 (!) 135/51  ?Pulse: 65 (!) 59  ?Resp: 17 17  ?Temp: 99 ?F (37.2 ?C) 98.2 ?F (36.8 ?C)  ?SpO2: 98% 100%  ? ?CBC Latest Ref Rng & Units 08/03/2021 08/02/2021 08/01/2021  ?WBC 4.0 - 10.5 K/uL 8.6 6.9 12.2(H)  ?Hemoglobin 12.0 - 15.0 g/dL 9.0(L) 9.2(L) 9.4(L)  ?Hematocrit 36.0 - 46.0 % 28.9(L) 29.6(L) 28.3(L)  ?Platelets 150 - 400 K/uL 186 173 177  ? ? ? ?ASSESSMENT: ?Charlotte Henry is a 81 y.o. female doing well.  Stable postop  ? ?PLAN: ?Weightbearing: WBAT RLE ?Insicional and dressing care: Reinforce dressings as needed ?Orthopedic device(s): None ?VTE prophylaxis: Recommend Aspirin 81mg  BID; to begin POD#1 ?Pain control: PO pain medications as needed; judicious use of narcotics ?Follow - up plan: 2 weeks; if she goes to a facility, incision can be evaluated at 2 weeks and sutures trimmed.  Follow up can be scheduled for 6 weeks postop in the office.  ? ? ?Contact information:   ? ? ?Harlin Mazzoni A. Amedeo Kinsman, MD MS ?[place] ?[address] ?[place],  [place]  [address] ?Phone: [phone] ?Fax: [phone] ?   ?

## 2021-08-03 NOTE — Assessment & Plan Note (Signed)
-  Stage IIIb chronic kidney disease ?-Improved after fluid resuscitation and back to baseline. ?-Continue to follow renal function trend and minimize the use of nephrotoxic agents. ?-Patient advised to maintain adequate hydration. ?

## 2021-08-03 NOTE — TOC Progression Note (Signed)
Transition of Care (TOC) - Progression Note  ? ? ?Patient Details  ?Name: MARRION FINAN ?MRN: [id number] ?Date of Birth: March 07, 1941 ? ?Transition of Care (TOC) CM/SW Contact  ?Boneta Lucks, RN ?Phone Number: ?08/03/2021, 4:35 PM ? ?Clinical Narrative:  Family accepted the bed offer at [facility].  Just received INS AUTH. Debbie said to late to get medication for patient today. DC summary sent, patient will discharge in the morning.  ? ? ?Expected Discharge Plan: [place] ?Barriers to Discharge: Insurance Authorization ? ?Expected Discharge Plan and Services ?Expected Discharge Plan: [place] ?In-house Referral: Clinical Social Work ?  ?Post Acute Care Choice: [place] ?Living arrangements for the past 2 months: [place] ?Expected Discharge Date: 08/03/21               ? Readmission Risk Interventions ?No flowsheet data found. ? ?

## 2021-08-03 NOTE — Plan of Care (Signed)
?  Problem: Acute Rehab PT Goals(only PT should resolve) ?Goal: Pt Will Go Supine/Side To Sit ?Flowsheets (Taken 08/03/2021 1159) ?Pt will go Supine/Side to Sit: with moderate assist ?Goal: Patient Will Transfer Sit To/From Stand ?Flowsheets (Taken 08/03/2021 1159) ?Patient will transfer sit to/from stand: with moderate assist ?Goal: Pt Will Transfer Bed To Chair/Chair To Bed ?Flowsheets (Taken 08/03/2021 1159) ?Pt will Transfer Bed to Chair/Chair to Bed: with mod assist ?Goal: Pt Will Ambulate ?Flowsheets (Taken 08/03/2021 1159) ?Pt will Ambulate: ? 15 feet ? with moderate assist ? with rolling walker ?  ?12:00 PM, 08/03/21 ?Lonell Grandchild, MPT ?Physical Therapist with  ?[facility] ?[phone] office ?[phone] mobile phone ? ?

## 2021-08-03 NOTE — Assessment & Plan Note (Signed)
-   No chest pain or shortness of breath ?-Continue risk factor modification. ?

## 2021-08-03 NOTE — Assessment & Plan Note (Signed)
-  Stable and well-controlled. ?-Continue to follow vital signs. ?

## 2021-08-04 DIAGNOSIS — Z743 Need for continuous supervision: Secondary | ICD-10-CM | POA: Diagnosis not present

## 2021-08-04 DIAGNOSIS — R2681 Unsteadiness on feet: Secondary | ICD-10-CM | POA: Diagnosis not present

## 2021-08-04 DIAGNOSIS — E785 Hyperlipidemia, unspecified: Secondary | ICD-10-CM | POA: Diagnosis not present

## 2021-08-04 DIAGNOSIS — E11622 Type 2 diabetes mellitus with other skin ulcer: Secondary | ICD-10-CM | POA: Diagnosis not present

## 2021-08-04 DIAGNOSIS — I509 Heart failure, unspecified: Secondary | ICD-10-CM | POA: Diagnosis not present

## 2021-08-04 DIAGNOSIS — L89323 Pressure ulcer of left buttock, stage 3: Secondary | ICD-10-CM | POA: Diagnosis not present

## 2021-08-04 DIAGNOSIS — R9431 Abnormal electrocardiogram [ECG] [EKG]: Secondary | ICD-10-CM | POA: Diagnosis not present

## 2021-08-04 DIAGNOSIS — E119 Type 2 diabetes mellitus without complications: Secondary | ICD-10-CM | POA: Diagnosis not present

## 2021-08-04 DIAGNOSIS — K5900 Constipation, unspecified: Secondary | ICD-10-CM | POA: Diagnosis not present

## 2021-08-04 DIAGNOSIS — R269 Unspecified abnormalities of gait and mobility: Secondary | ICD-10-CM | POA: Diagnosis not present

## 2021-08-04 DIAGNOSIS — M6281 Muscle weakness (generalized): Secondary | ICD-10-CM | POA: Diagnosis not present

## 2021-08-04 DIAGNOSIS — K219 Gastro-esophageal reflux disease without esophagitis: Secondary | ICD-10-CM | POA: Diagnosis not present

## 2021-08-04 DIAGNOSIS — Z7401 Bed confinement status: Secondary | ICD-10-CM | POA: Diagnosis not present

## 2021-08-04 DIAGNOSIS — R531 Weakness: Secondary | ICD-10-CM | POA: Diagnosis not present

## 2021-08-04 DIAGNOSIS — F02818 Dementia in other diseases classified elsewhere, unspecified severity, with other behavioral disturbance: Secondary | ICD-10-CM | POA: Diagnosis not present

## 2021-08-04 DIAGNOSIS — S72001D Fracture of unspecified part of neck of right femur, subsequent encounter for closed fracture with routine healing: Secondary | ICD-10-CM | POA: Diagnosis not present

## 2021-08-04 DIAGNOSIS — N189 Chronic kidney disease, unspecified: Secondary | ICD-10-CM | POA: Diagnosis not present

## 2021-08-04 DIAGNOSIS — R279 Unspecified lack of coordination: Secondary | ICD-10-CM | POA: Diagnosis not present

## 2021-08-04 DIAGNOSIS — I10 Essential (primary) hypertension: Secondary | ICD-10-CM | POA: Diagnosis not present

## 2021-08-04 MED ORDER — BISACODYL 10 MG RE SUPP
10.0000 mg | Freq: Every day | RECTAL | Status: DC | PRN
  Administered 2021-08-04: 10 mg via RECTAL
  Filled 2021-08-04: qty 1

## 2021-08-04 MED ORDER — BISACODYL 10 MG RE SUPP
10.0000 mg | Freq: Every day | RECTAL | Status: AC | PRN
Start: 2021-08-04 — End: ?

## 2021-08-04 NOTE — Plan of Care (Signed)

## 2021-08-04 NOTE — Progress Notes (Signed)
Report given to Alyssa RN

## 2021-08-04 NOTE — TOC Transition Note (Signed)
Transition of Care (TOC) - CM/SW Discharge Note ? ? ?Patient Details  ?Name: Charlotte Henry ?MRN: [id number] ?Date of Birth: 11-Jul-1940 ? ?Transition of Care (TOC) CM/SW Contact:  ?Boneta Lucks, RN ?Phone Number: ?08/04/2021, 12:26 PM ? ? ?Clinical Narrative:   Patient discharging to [facility], RN report EMS here now to transport. Daughter - Anderson Malta updated, she is at the bedside. DC summary sent in the hub.  ? ?Final next level of care: [place] ?Barriers to Discharge: Barriers Resolved ? ? ?Patient Goals and CMS Choice ?Patient states their goals for this hospitalization and ongoing recovery are:: rehab ?CMS Medicare.gov Compare Post Acute Care list provided to:: Patient Represenative (must comment) ?Choice offered to / list presented to : Adult Children ? ?Discharge Placement ?  ?           ?Patient chooses bed at:  [facility]) ?Patient to be transferred to facility by: EMS ?Name of family member notified: Anderson Malta ?Patient and family notified of of transfer: 08/04/21 ? ?Discharge Plan and Services ?In-house Referral: Clinical Social Work ?  ?Post Acute Care Choice: [place]          ?  ?   ?  ?Readmission Risk Interventions ?Readmission Risk Prevention Plan 08/04/2021  ?Transportation Screening Complete  ?Medication Review Press photographer) Complete  ?PCP or Specialist appointment within 3-5 days of discharge Complete  ?[place] or Home Care Consult Complete  ?Some recent data might be hidden  ? ? ? ? ? ?

## 2021-08-04 NOTE — Progress Notes (Signed)
Chart reviewed and discharge summary updated. Patient appears hemodynamically stable and ready to pursuit rehabilitation and further care at [facility]. She failed to be discharge yesterday as plan due to her insurance authorization received too late for facility to take patient. No overnight events. ? ?Barton Dubois MD ?[phone] ?  ?

## 2021-08-04 NOTE — Care Management Important Message (Signed)
Important Message ? ?Patient Details  ?Name: Charlotte Henry ?MRN: [id number] ?Date of Birth: [birth year] ? ? ?Medicare Important Message Given:  Yes ? ? ? ? ?Tommy Medal ?08/04/2021, 11:24 AM ?

## 2021-08-04 NOTE — Plan of Care (Signed)

## 2021-08-05 LAB — URINE CULTURE: Culture: >=100000 — AB

## 2021-08-08 ENCOUNTER — Encounter (HOSPITAL_COMMUNITY): Payer: Self-pay | Admitting: Orthopedic Surgery

## 2021-08-08 DIAGNOSIS — F02818 Dementia in other diseases classified elsewhere, unspecified severity, with other behavioral disturbance: Secondary | ICD-10-CM | POA: Diagnosis not present

## 2021-08-08 DIAGNOSIS — I10 Essential (primary) hypertension: Secondary | ICD-10-CM | POA: Diagnosis not present

## 2021-08-08 DIAGNOSIS — N189 Chronic kidney disease, unspecified: Secondary | ICD-10-CM | POA: Diagnosis not present

## 2021-08-08 DIAGNOSIS — K219 Gastro-esophageal reflux disease without esophagitis: Secondary | ICD-10-CM | POA: Diagnosis not present

## 2021-08-08 DIAGNOSIS — E785 Hyperlipidemia, unspecified: Secondary | ICD-10-CM | POA: Diagnosis not present

## 2021-08-08 DIAGNOSIS — I509 Heart failure, unspecified: Secondary | ICD-10-CM | POA: Diagnosis not present

## 2021-08-08 DIAGNOSIS — S72001D Fracture of unspecified part of neck of right femur, subsequent encounter for closed fracture with routine healing: Secondary | ICD-10-CM | POA: Diagnosis not present

## 2021-08-15 DIAGNOSIS — I509 Heart failure, unspecified: Secondary | ICD-10-CM | POA: Diagnosis not present

## 2021-08-15 DIAGNOSIS — R9431 Abnormal electrocardiogram [ECG] [EKG]: Secondary | ICD-10-CM | POA: Diagnosis not present

## 2021-08-15 DIAGNOSIS — E785 Hyperlipidemia, unspecified: Secondary | ICD-10-CM | POA: Diagnosis not present

## 2021-08-15 DIAGNOSIS — S72001D Fracture of unspecified part of neck of right femur, subsequent encounter for closed fracture with routine healing: Secondary | ICD-10-CM | POA: Diagnosis not present

## 2021-08-15 DIAGNOSIS — N189 Chronic kidney disease, unspecified: Secondary | ICD-10-CM | POA: Diagnosis not present

## 2021-08-15 DIAGNOSIS — K219 Gastro-esophageal reflux disease without esophagitis: Secondary | ICD-10-CM | POA: Diagnosis not present

## 2021-08-16 ENCOUNTER — Ambulatory Visit (INDEPENDENT_AMBULATORY_CARE_PROVIDER_SITE_OTHER): Payer: Medicare Other | Admitting: Orthopedic Surgery

## 2021-08-16 ENCOUNTER — Other Ambulatory Visit: Payer: Self-pay

## 2021-08-16 ENCOUNTER — Encounter: Payer: Self-pay | Admitting: Orthopedic Surgery

## 2021-08-16 ENCOUNTER — Ambulatory Visit: Payer: Medicare Other

## 2021-08-16 DIAGNOSIS — S72001D Fracture of unspecified part of neck of right femur, subsequent encounter for closed fracture with routine healing: Secondary | ICD-10-CM

## 2021-08-16 DIAGNOSIS — M25561 Pain in right knee: Secondary | ICD-10-CM

## 2021-08-16 DIAGNOSIS — G8929 Other chronic pain: Secondary | ICD-10-CM

## 2021-08-16 NOTE — Progress Notes (Signed)
Orthopaedic Postop Note ? ?Assessment: ?Charlotte Henry is a 81 y.o. female s/p CRPP of right femoral neck fracture  ? ?DOS: 08/02/21 ? ?Plan: ?Sutures trimmed, steri strips placed ?Radiographs stable. ?Continue WBAT, using a walker ?Medications as needed.  ?Follow up in 4 weeks ? ?If she continues to have pain in her right knee, we can evaluate this, and consider an injection. ? ? ?Follow-up: ?No follow-ups on file. ?XR at next visit: AP pelvis, R hip  ? ?Subjective: ? ?Chief Complaint  ?Patient presents with  ? Hip Pain  ?  CRPP of the Right femoral neck fracture DOS 08/02/21- doing well no pain.  Xrays- to remove dressing on xray table.   ? ? ?History of Present Illness: ?Charlotte Henry is a 81 y.o. female who presents following the above stated procedure.  Surgery was approximately 2 weeks ago.  She is doing well.  She has no pain in her right hip.  She remains in a nursing facility, and is participating with therapy.  No other concerns at this time. ? ? ?Review of Systems: ?No fevers or chills ?No numbness or tingling ?No Chest Pain ?No shortness of breath ? ? ?Objective: ?There were no vitals taken for this visit. ? ?Physical Exam: ? ?Alert.  Answer simple questions.  Seated in a wheelchair. ? ?Right hip surgical incisions healing well.  No surrounding erythema or drainage.  No tenderness to palpation along the incision.  She tolerates gentle range of motion of her right hip.  She is able to flex her right hip actively.  Approximately 30 degree flexion contracture of the right knee.  No swelling of the right knee currently.  Toes are warm and well-perfused. ? ?IMAGING: ?I personally ordered and reviewed the following images: ? ?AP pelvis and right hip x-rays were obtained in clinic today.  3 cannulated screws traverse the fracture, without change in appearance.  Overall alignment remains stable.  The screws are not backing out.  No evidence of AVN.  No subsidence of the fracture site. ? ?Impression: Healing  right femoral neck fracture, without hardware complication ? ?Mordecai Rasmussen, MD ?08/16/2021 ?11:05 AM ? ? ?

## 2021-08-22 DIAGNOSIS — E785 Hyperlipidemia, unspecified: Secondary | ICD-10-CM | POA: Diagnosis not present

## 2021-08-22 DIAGNOSIS — S72001D Fracture of unspecified part of neck of right femur, subsequent encounter for closed fracture with routine healing: Secondary | ICD-10-CM | POA: Diagnosis not present

## 2021-08-22 DIAGNOSIS — K219 Gastro-esophageal reflux disease without esophagitis: Secondary | ICD-10-CM | POA: Diagnosis not present

## 2021-08-22 DIAGNOSIS — K5900 Constipation, unspecified: Secondary | ICD-10-CM | POA: Diagnosis not present

## 2021-08-22 DIAGNOSIS — R531 Weakness: Secondary | ICD-10-CM | POA: Diagnosis not present

## 2021-08-24 DIAGNOSIS — L89323 Pressure ulcer of left buttock, stage 3: Secondary | ICD-10-CM | POA: Diagnosis not present

## 2021-08-24 DIAGNOSIS — R269 Unspecified abnormalities of gait and mobility: Secondary | ICD-10-CM | POA: Diagnosis not present

## 2021-08-24 DIAGNOSIS — E11622 Type 2 diabetes mellitus with other skin ulcer: Secondary | ICD-10-CM | POA: Diagnosis not present

## 2021-08-24 DIAGNOSIS — M6281 Muscle weakness (generalized): Secondary | ICD-10-CM | POA: Diagnosis not present

## 2021-08-25 DIAGNOSIS — R531 Weakness: Secondary | ICD-10-CM | POA: Diagnosis not present

## 2021-08-25 DIAGNOSIS — S72001D Fracture of unspecified part of neck of right femur, subsequent encounter for closed fracture with routine healing: Secondary | ICD-10-CM | POA: Diagnosis not present

## 2021-08-25 DIAGNOSIS — N189 Chronic kidney disease, unspecified: Secondary | ICD-10-CM | POA: Diagnosis not present

## 2021-08-25 DIAGNOSIS — I509 Heart failure, unspecified: Secondary | ICD-10-CM | POA: Diagnosis not present

## 2021-08-25 DIAGNOSIS — E785 Hyperlipidemia, unspecified: Secondary | ICD-10-CM | POA: Diagnosis not present

## 2021-08-25 DIAGNOSIS — I10 Essential (primary) hypertension: Secondary | ICD-10-CM | POA: Diagnosis not present

## 2021-08-25 DIAGNOSIS — K219 Gastro-esophageal reflux disease without esophagitis: Secondary | ICD-10-CM | POA: Diagnosis not present

## 2021-08-25 IMAGING — CT CT ABD-PELV W/ CM
2 of 5 series · 12 of 36 positions shown, 15 images · IV contrast (omnipaque)
Comparison: Chest CT 11/14/2018. CT the abdomen and pelvis
01/03/2016.

CLINICAL DATA: 78-year-old female with history of non-small cell
lung cancer with history of recurrent disease. Prior history of
radiation therapy. Follow-up study.

EXAM:
CT CHEST, ABDOMEN, AND PELVIS WITH CONTRAST
TECHNIQUE: Multidetector CT imaging of the chest, abdomen and pelvis was
performed following the standard protocol during bolus
administration of intravenous contrast.
CONTRAST:  100mL OMNIPAQUE IOHEXOL 300 MG/ML  SOLN

[Series 2: cap with · axial · 0.78mm/px · z∈[-758,-263]mm · 9 of 122 slices shown, 12 images]
[im 12/122  mediastinal]
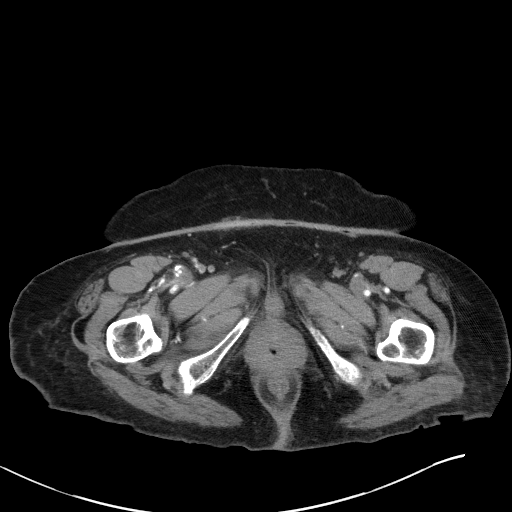
[im 12/122  lung]
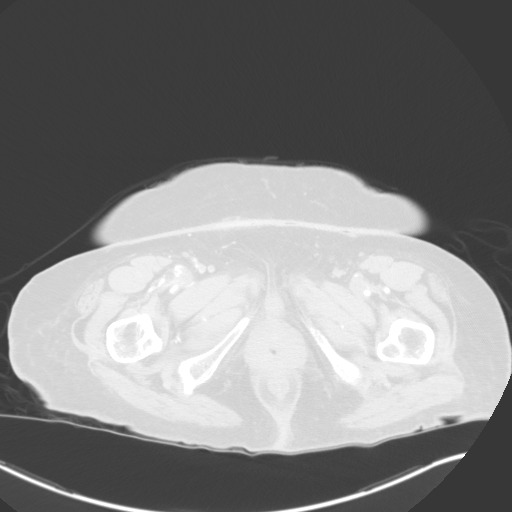
[im 23/122  lung]
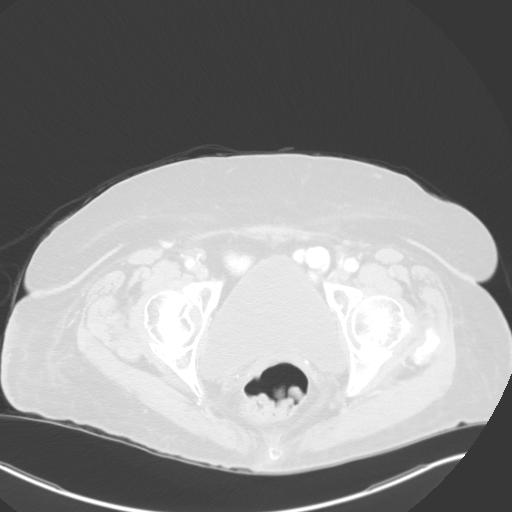
[im 34/122  lung]
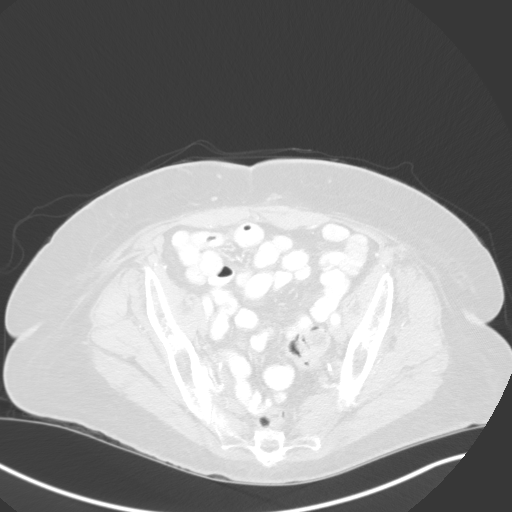
[im 45/122  lung]
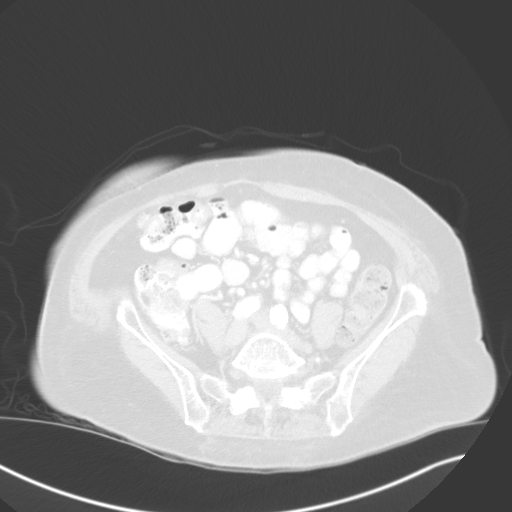
[im 67/122  mediastinal]
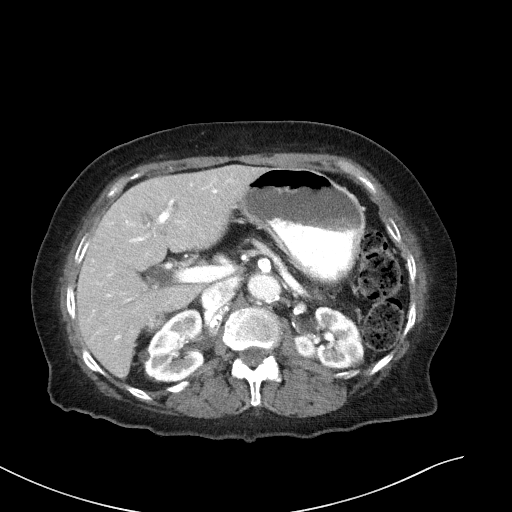
[im 67/122  lung]
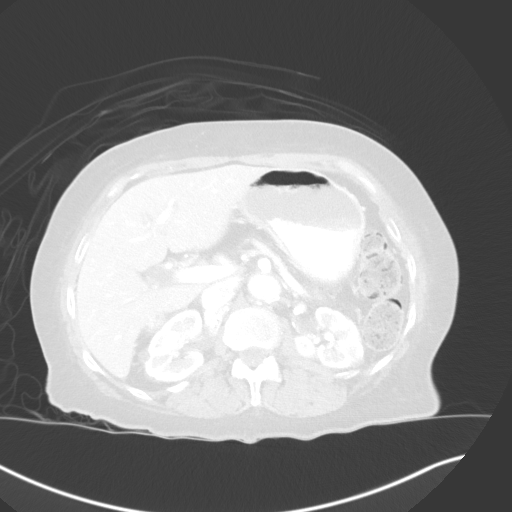
[im 78/122  lung]
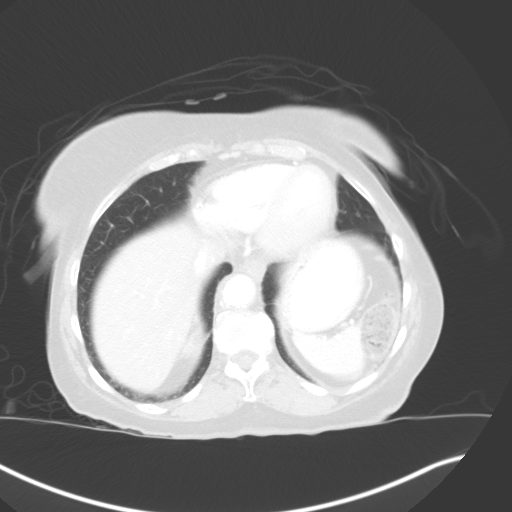
[im 89/122  lung]
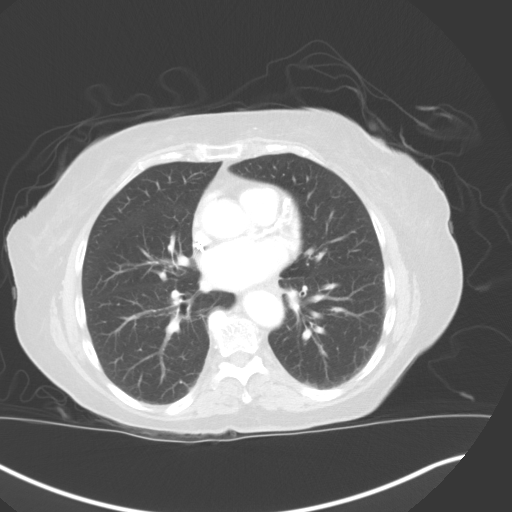
[im 100/122  lung]
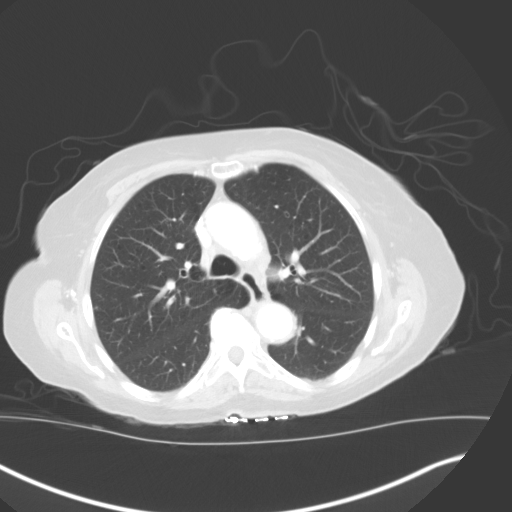
[im 111/122  mediastinal]
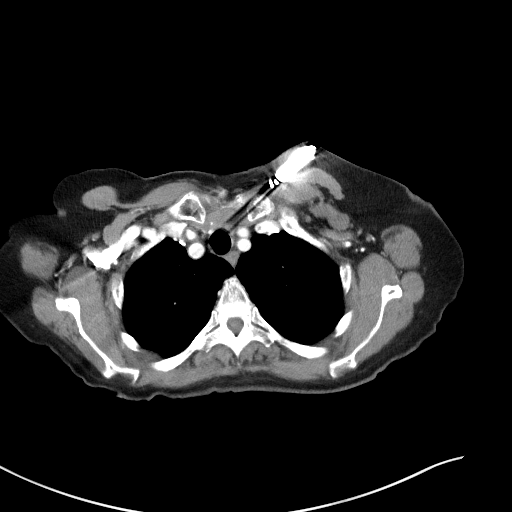
[im 111/122  lung]
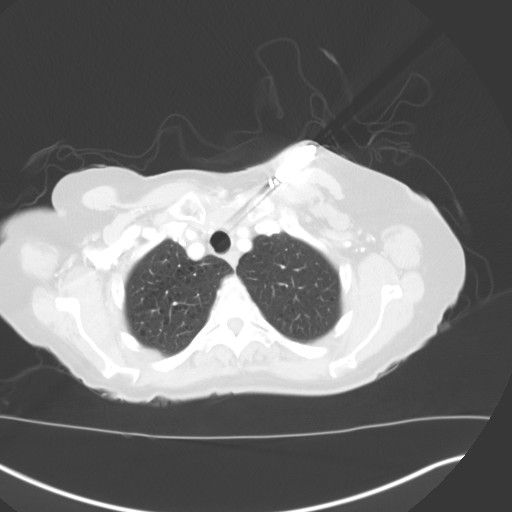

[Series 4: coronals · coronal · 0.82mm/px · 3 of 154 slices shown]
[im 31/154  lung]
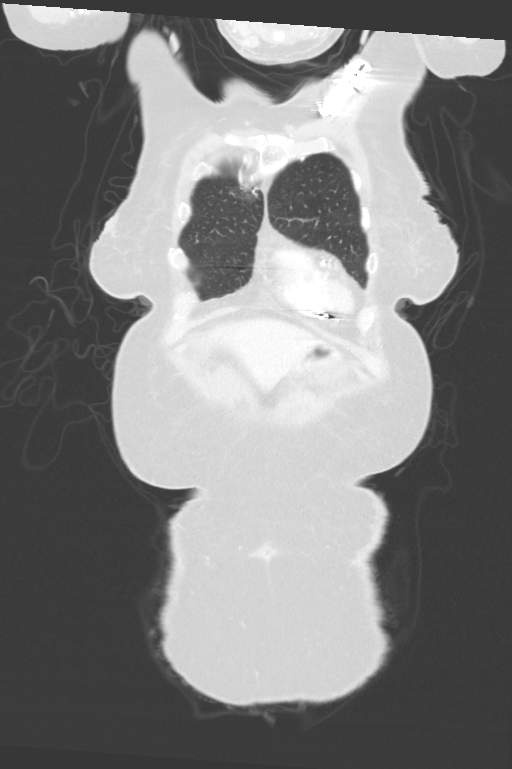
[im 62/154  lung]
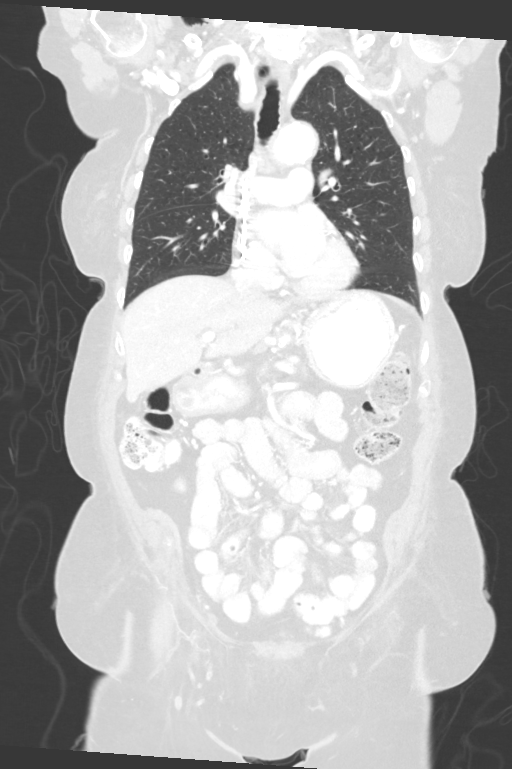
[im 92/154  lung]
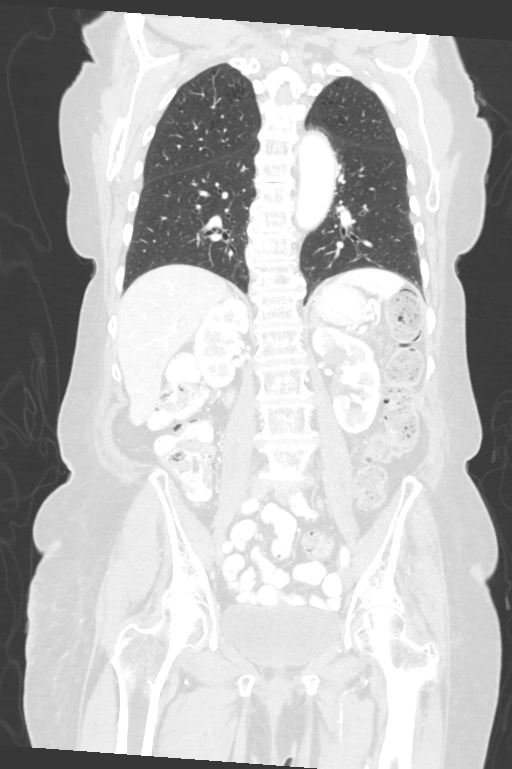

[12 of 36 positions shown; findings below may reference images not displayed]

FINDINGS: CT CHEST FINDINGS

Cardiovascular: Heart size is normal. There is no significant
pericardial fluid, thickening or pericardial calcification. There is
aortic atherosclerosis, as well as atherosclerosis of the great
vessels of the mediastinum and the coronary arteries, including
calcified atherosclerotic plaque in the left main, left anterior
descending, left circumflex and right coronary arteries. Thickening
and calcification of the aortic valve. Left-sided pacemaker/AICD
device in place with lead tips terminating in the right atrium and
right ventricular apex.

Mediastinum/Nodes: No pathologically enlarged mediastinal or hilar
lymph nodes. Esophagus is unremarkable in appearance. No axillary
lymphadenopathy.

Lungs/Pleura: Focal area of architectural distortion in the medial
aspect of the right upper lobe (axial image 70 of series 3),
unchanged, potentially postradiation changes. No suspicious
appearing pulmonary nodules or masses are noted. No acute
consolidative airspace disease. No pleural effusions. Mild diffuse
bronchial wall thickening with mild centrilobular and paraseptal
emphysema.

Musculoskeletal: There are no aggressive appearing lytic or blastic
lesions noted in the visualized portions of the skeleton.

CT ABDOMEN PELVIS FINDINGS

Hepatobiliary: 1.6 x 0.9 cm hypervascular lesion in segment 3 of the
liver (axial image 59 of series 2), incompletely characterized, but
favored to represent a perfusion anomaly or flash fill cavernous
hemangioma (this region appears isoattenuating to blood pool on
delayed images). No other suspicious cystic or solid hepatic
lesions. No intra or extrahepatic biliary ductal dilatation. Status
post cholecystectomy.

Pancreas: Atrophy throughout the body and tail of the pancreas. No
pancreatic mass noted in the pancreatic head or uncinate process. No
peripancreatic fluid collections or inflammatory changes.

Spleen: Unremarkable.

Adrenals/Urinary Tract: Multiple well-defined low-attenuation
lesions noted in both kidneys, compatible with simple cysts, largest
of which measures 1.8 cm in the interpolar region of the right
kidney. Several other subcentimeter low-attenuation lesions in both
kidneys, too small to characterize, but statistically likely to
represent tiny cysts. No hydroureteronephrosis. Urinary bladder is
normal in appearance. Bilateral adrenal glands are unremarkable in
appearance.

Stomach/Bowel: Normal appearance of the stomach. No pathologic
dilatation of small bowel or colon. The appendix is not confidently
identified and may be surgically absent. Regardless, there are no
inflammatory changes noted adjacent to the cecum to suggest the
presence of an acute appendicitis at this time.

Vascular/Lymphatic: Aortic atherosclerosis with mild fusiform
aneurysmal dilatation of the infrarenal abdominal aorta measuring
3.2 x 2.9 cm. Complete occlusion of the superficial femoral arteries
bilaterally. No lymphadenopathy noted in the abdomen or pelvis.

Reproductive: Status post hysterectomy. Ovaries are not confidently
identified may be surgically absent or atrophic.

Other: No significant volume of ascites.  No pneumoperitoneum.

Musculoskeletal: There are no aggressive appearing lytic or blastic
lesions noted in the visualized portions of the skeleton.
IMPRESSION: 1. No findings to suggest residual, recurrent or metastatic disease
noted in the chest, abdomen or pelvis.
2. Aortic atherosclerosis, in addition to left main and 3 vessel
coronary artery disease. In addition there is mild fusiform
aneurysmal dilatation of the infrarenal abdominal aorta which
measures 3.2 x 2.9 cm. Recommend followup by ultrasound in 3 years.
This recommendation follows ACR consensus guidelines: White Paper of
the ACR Incidental Findings Committee II on Vascular Findings. [HOSPITAL] 1608; [DATE].
3. There are calcifications of the aortic valve. Echocardiographic
correlation for evaluation of potential valvular dysfunction may be
warranted if clinically indicated.
4. Diffuse bronchial wall thickening with mild centrilobular and
paraseptal emphysema; imaging findings suggestive of underlying
COPD.
5. 1.6 x 0.9 cm hypervascular lesion in segment 3 of the liver,
favored to represent a perfusion anomaly or flash fill cavernous
hemangioma. Attention on any future follow-up studies is recommended
to ensure the stability of this finding.
6. Additional incidental findings, as above.

## 2021-08-29 ENCOUNTER — Encounter (HOSPITAL_COMMUNITY): Payer: Self-pay | Admitting: Hematology

## 2021-09-02 ENCOUNTER — Encounter (HOSPITAL_COMMUNITY): Payer: Self-pay | Admitting: Hematology

## 2021-09-05 ENCOUNTER — Encounter (HOSPITAL_COMMUNITY): Payer: Self-pay | Admitting: Hematology

## 2021-09-06 ENCOUNTER — Encounter: Payer: Medicare Other | Admitting: Internal Medicine

## 2021-09-16 ENCOUNTER — Ambulatory Visit: Payer: Medicare Other | Admitting: Orthopedic Surgery

## 2021-10-10 ENCOUNTER — Ambulatory Visit (INDEPENDENT_AMBULATORY_CARE_PROVIDER_SITE_OTHER): Payer: Medicare Other

## 2021-10-10 DIAGNOSIS — I442 Atrioventricular block, complete: Secondary | ICD-10-CM | POA: Diagnosis not present

## 2021-10-11 LAB — CUP PACEART REMOTE DEVICE CHECK
Battery Remaining Longevity: 41 mo
Battery Remaining Percentage: 40 %
Battery Voltage: 2.96 V
Brady Statistic AP VP Percent: 72 %
Brady Statistic AP VS Percent: 1.1 %
Brady Statistic AS VP Percent: 26 %
Brady Statistic AS VS Percent: 1 %
Brady Statistic RA Percent Paced: 72 %
Brady Statistic RV Percent Paced: 99 %
Date Time Interrogation Session: 20230508025057
Implantable Lead Implant Date: 20171020
Implantable Lead Implant Date: 20171020
Implantable Lead Location: 753859
Implantable Lead Location: 753860
Implantable Pulse Generator Implant Date: 20171020
Lead Channel Impedance Value: 390 Ohm
Lead Channel Impedance Value: 410 Ohm
Lead Channel Pacing Threshold Amplitude: 0.75 V
Lead Channel Pacing Threshold Amplitude: 1 V
Lead Channel Pacing Threshold Pulse Width: 0.4 ms
Lead Channel Pacing Threshold Pulse Width: 0.4 ms
Lead Channel Sensing Intrinsic Amplitude: 1 mV
Lead Channel Sensing Intrinsic Amplitude: 6.9 mV
Lead Channel Setting Pacing Amplitude: 2 V
Lead Channel Setting Pacing Amplitude: 2.5 V
Lead Channel Setting Pacing Pulse Width: 0.4 ms
Lead Channel Setting Sensing Sensitivity: 3.5 mV
Pulse Gen Model: 2272
Pulse Gen Serial Number: 3180497

## 2021-10-12 ENCOUNTER — Emergency Department (HOSPITAL_COMMUNITY): Payer: Medicare Other

## 2021-10-12 ENCOUNTER — Other Ambulatory Visit: Payer: Self-pay

## 2021-10-12 ENCOUNTER — Encounter (HOSPITAL_COMMUNITY): Payer: Self-pay | Admitting: Hematology

## 2021-10-12 ENCOUNTER — Encounter (HOSPITAL_COMMUNITY): Payer: Self-pay | Admitting: Emergency Medicine

## 2021-10-12 ENCOUNTER — Emergency Department (HOSPITAL_COMMUNITY)
Admission: EM | Admit: 2021-10-12 | Discharge: 2021-10-12 | Disposition: A | Discharge status: Home / Self Care | Payer: Medicare Other | Attending: Emergency Medicine | Admitting: Emergency Medicine

## 2021-10-12 DIAGNOSIS — R5383 Other fatigue: Secondary | ICD-10-CM | POA: Diagnosis not present

## 2021-10-12 DIAGNOSIS — Z7982 Long term (current) use of aspirin: Secondary | ICD-10-CM | POA: Diagnosis not present

## 2021-10-12 DIAGNOSIS — F0390 Unspecified dementia without behavioral disturbance: Secondary | ICD-10-CM | POA: Diagnosis not present

## 2021-10-12 DIAGNOSIS — R531 Weakness: Secondary | ICD-10-CM | POA: Diagnosis not present

## 2021-10-12 DIAGNOSIS — Z743 Need for continuous supervision: Secondary | ICD-10-CM | POA: Diagnosis not present

## 2021-10-12 HISTORY — DX: Unspecified dementia, unspecified severity, without behavioral disturbance, psychotic disturbance, mood disturbance, and anxiety: F03.90

## 2021-10-12 LAB — COMPREHENSIVE METABOLIC PANEL
ALT: 26 U/L (ref 0–44)
AST: 47 U/L — ABNORMAL HIGH (ref 15–41)
Albumin: 3.8 g/dL (ref 3.5–5.0)
Alkaline Phosphatase: 67 U/L (ref 38–126)
Anion gap: 8 (ref 5–15)
BUN: 39 mg/dL — ABNORMAL HIGH (ref 8–23)
CO2: 27 mmol/L (ref 22–32)
Calcium: 8.9 mg/dL (ref 8.9–10.3)
Chloride: 103 mmol/L (ref 98–111)
Creatinine, Ser: 1.86 mg/dL — ABNORMAL HIGH (ref 0.44–1.00)
GFR, Estimated: 27 mL/min — ABNORMAL LOW (ref >60)
Glucose, Bld: 107 mg/dL — ABNORMAL HIGH (ref 70–99)
Potassium: 4.4 mmol/L (ref 3.5–5.1)
Sodium: 138 mmol/L (ref 135–145)
Total Bilirubin: 0.3 mg/dL (ref 0.3–1.2)
Total Protein: 6.7 g/dL (ref 6.5–8.1)

## 2021-10-12 LAB — URINALYSIS, MICROSCOPIC (REFLEX)

## 2021-10-12 LAB — CBC WITH DIFFERENTIAL/PLATELET
Abs Immature Granulocytes: 0.02 10*3/uL (ref 0.00–0.07)
Basophils Absolute: 0.1 10*3/uL (ref 0.0–0.1)
Basophils Relative: 1 %
Eosinophils Absolute: 0.3 10*3/uL (ref 0.0–0.5)
Eosinophils Relative: 4 %
HCT: 27.7 % — ABNORMAL LOW (ref 36.0–46.0)
Hemoglobin: 8.7 g/dL — ABNORMAL LOW (ref 12.0–15.0)
Immature Granulocytes: 0 %
Lymphocytes Relative: 33 %
Lymphs Abs: 2.5 10*3/uL (ref 0.7–4.0)
MCH: 29.4 pg (ref 26.0–34.0)
MCHC: 31.4 g/dL (ref 30.0–36.0)
MCV: 93.6 fL (ref 80.0–100.0)
Monocytes Absolute: 0.7 10*3/uL (ref 0.1–1.0)
Monocytes Relative: 10 %
Neutro Abs: 4 10*3/uL (ref 1.7–7.7)
Neutrophils Relative %: 52 %
Platelets: 217 10*3/uL (ref 150–400)
RBC: 2.96 MIL/uL — ABNORMAL LOW (ref 3.87–5.11)
RDW: 14.6 % (ref 11.5–15.5)
WBC: 7.6 10*3/uL (ref 4.0–10.5)
nRBC: 0 % (ref 0.0–0.2)

## 2021-10-12 LAB — URINALYSIS, ROUTINE W REFLEX MICROSCOPIC
Bilirubin Urine: NEGATIVE
Glucose, UA: NEGATIVE mg/dL
Ketones, ur: NEGATIVE mg/dL
Leukocytes,Ua: NEGATIVE
Nitrite: NEGATIVE
Protein, ur: NEGATIVE mg/dL
Specific Gravity, Urine: 1.02 (ref 1.005–1.030)
pH: 6 (ref 5.0–8.0)

## 2021-10-12 MED ORDER — SODIUM CHLORIDE 0.9 % IV BOLUS
500.0000 mL | Freq: Once | INTRAVENOUS | Status: AC
  Administered 2021-10-12: 500 mL via INTRAVENOUS

## 2021-10-12 NOTE — Discharge Instructions (Addendum)
Your urine test showed some bacteria, however this appears to be more of a contaminant from skin.  It does not appear to be a true infection. ? ?Follow-up with your doctor this week.  Return back to the ER if you have fevers or worsening symptoms. ?

## 2021-10-12 NOTE — ED Notes (Signed)
Pt has pacemaker ?

## 2021-10-12 NOTE — ED Triage Notes (Signed)
Pt is on hospice at home. Family reports that pt has increased weakness on right side. Also reports bed sore on butt. ?

## 2021-10-12 NOTE — ED Notes (Signed)
Correction: Pt is on palliative care, not hospice ?

## 2021-10-12 NOTE — ED Provider Notes (Signed)
?[facility] ?Provider Note ? ? ?CSN: [id number] ?Arrival date & time: 10/12/21  1504 ? ?  ? ?History ? ?Chief Complaint  ?Patient presents with  ? Weakness  ?  Weakness right side has increased  ? ? ?[facility] is a 81 y.o. female. ? ?Patient on palliative care with history of dementia, peripheral vascular disease, history of prior stroke, heart disease and multiple comorbidities, presents from home chief complaint of increased fatigue and questionable right-sided facial droop.  Symptoms ongoing today.  Last known well last night.  Otherwise no reports of any fevers or cough or vomiting or diarrhea. ? ? ?  ? ?Home Medications ?Prior to Admission medications   ?Medication Sig Start Date End Date Taking? Authorizing Provider  ?acetaminophen (TYLENOL) 325 MG tablet Take 2 tablets (650 mg total) by mouth every 6 (six) hours as needed for mild pain (or Fever >/= 101). 11/18/20   Roxan Hockey, MD  ?albuterol (VENTOLIN HFA) 108 (90 Base) MCG/ACT inhaler Inhale 2 puffs into the lungs every 4 (four) hours as needed for wheezing or shortness of breath. 12/02/20   Gerlene Fee, NP  ?aspirin EC 81 MG tablet Take 1 tablet (81 mg total) by mouth in the morning and at bedtime. Swallow whole. 08/03/21 08/03/22  Barton Dubois, MD  ?[facility] ADULT CARE 11.3 % CREA cream Apply topically. 08/30/21   [provider]  ?bisacodyl (DULCOLAX) 10 MG suppository Place 1 suppository (10 mg total) rectally daily as needed for moderate constipation. 08/04/21   Barton Dubois, MD  ?calcium citrate-vitamin D (CITRACAL+D) 315-200 MG-UNIT per tablet Take 1 tablet by mouth daily.    [provider]  ?Cyanocobalamin (VITAMIN B 12 PO) Take 250 mcg by mouth daily.    [provider]  ?donepezil (ARICEPT) 5 MG tablet Take 1 tablet (5 mg total) by mouth at bedtime. 12/02/20   Gerlene Fee, NP  ?escitalopram (LEXAPRO) 10 MG tablet Take 1 tablet (10 mg total) by mouth daily. 12/02/20   Gerlene Fee,  NP  ?ezetimibe (ZETIA) 10 MG tablet Take 1 tablet (10 mg total) by mouth daily. 12/02/20   Gerlene Fee, NP  ?fentaNYL (DURAGESIC) 12 MCG/HR SMARTSIG:1 Patch(s) T-DERMAL Every 3 Days 10/12/21   [provider]  ?ferrous sulfate 325 (65 FE) MG tablet Take 1 tablet (325 mg total) by mouth daily with breakfast. 12/02/20   Gerlene Fee, NP  ?furosemide (LASIX) 20 MG tablet Take 1 tablet (20 mg total) by mouth daily as needed for fluid or edema. 08/03/21   Barton Dubois, MD  ?haloperidol (HALDOL) 2 MG tablet Take 2 mg by mouth daily. 10/12/21   [provider]  ?haloperidol (HALDOL) 5 MG tablet Take 5 mg by mouth daily. 10/12/21   [provider]  ?Lidocaine 4 % PTCH Apply topically in the morning and at bedtime. apply to skin in AM and remove in PM    [provider]  ?LORazepam (ATIVAN) 0.5 MG tablet Take 0.5 mg by mouth every 3 (three) hours as needed. 10/06/21   [provider]  ?Multiple Vitamin (MULTIVITAMIN WITH MINERALS) TABS tablet Take 1 tablet by mouth daily. 08/05/16   Murlean Iba, MD  ?omeprazole (PRILOSEC) 20 MG capsule Take 1 capsule (20 mg total) by mouth 2 (two) times daily before a meal. 08/03/21   Barton Dubois, MD  ?ondansetron (ZOFRAN) 4 MG tablet Take 4 mg by mouth every 6 (six) hours as needed. 10/06/21   [provider]  ?oxyCODONE (OXY IR/ROXICODONE) 5 MG immediate release tablet Take 1 tablet (5 mg total) by mouth every 6 (six) hours as needed for severe pain. 08/03/21   Barton Dubois, MD  ?senna-docusate (SENOKOT-S) 8.6-50 MG tablet Take 2 tablets by mouth 2 (two) times daily. 08/03/21   Barton Dubois, MD  ?traZODone (DESYREL) 50 MG tablet Take 0.5 tablets (25 mg total) by mouth 2 (two) times daily. 08/03/21   Barton Dubois, MD  ?   ? ?Allergies    ?Ace inhibitors, Aspirin, Codeine, Pineapple, Plasticized base [plastibase], Ultram [tramadol], Adhesive [tape], Naprosyn [naproxen], Biaxin [clarithromycin], Clindamycin/lincomycin, Linzess  [linaclotide], Metronidazole, Nsaids, Penicillins, Varenicline tartrate, and Xarelto [rivaroxaban]   ? ?Review of Systems   ?Review of Systems  ?Unable to perform ROS: Dementia  ? ?Physical Exam ?Updated Vital Signs ?BP 131/76   Pulse 60   Temp 98.2 ?F (36.8 ?C) (Oral)   Resp 13   Ht 5\' 7"  (1.702 m)   Wt 58.5 kg   SpO2 96%   BMI 20.20 kg/m?  ?Physical Exam ?Constitutional:   ?   General: She is not in acute distress. ?   Appearance: Normal appearance.  ?HENT:  ?   Head: Normocephalic.  ?   Nose: Nose normal.  ?Eyes:  ?   Extraocular Movements: Extraocular movements intact.  ?Cardiovascular:  ?   Rate and Rhythm: Normal rate.  ?Pulmonary:  ?   Effort: Pulmonary effort is normal.  ?Musculoskeletal:     ?   General: Normal range of motion.  ?   Cervical back: Normal range of motion.  ?Neurological:  ?   Mental Status: She is alert.  ?   Comments: No facial droop noted on exam. ? ?No unilateral weakness noted.  Patient of global generalized weakness but no specific unilateral weakness noted.  Able to lift up both hands off the bed, bilateral lower extremity weakness present.  ? ? ?ED Results / Procedures / Treatments   ?Labs ?(all labs ordered are listed, but only abnormal results are displayed) ?Labs Reviewed  ?CBC WITH DIFFERENTIAL/PLATELET - Abnormal; Notable for the following components:  ?    Result Value  ? RBC 2.96 (*)   ? Hemoglobin 8.7 (*)   ? HCT 27.7 (*)   ? All other components within normal limits  ?COMPREHENSIVE METABOLIC PANEL - Abnormal; Notable for the following components:  ? Glucose, Bld 107 (*)   ? BUN 39 (*)   ? Creatinine, Ser 1.86 (*)   ? AST 47 (*)   ? GFR, Estimated 27 (*)   ? All other components within normal limits  ?URINALYSIS, ROUTINE W REFLEX MICROSCOPIC - Abnormal; Notable for the following components:  ? APPearance HAZY (*)   ? Hgb urine dipstick LARGE (*)   ? All other components within normal limits  ?URINALYSIS, MICROSCOPIC (REFLEX) - Abnormal; Notable for the following  components:  ? Bacteria, UA MANY (*)   ? All other components within normal limits  ? ? ?EKG ?EKG Interpretation ? ?Date/Time:  Wednesday Oct 12 2021 15:22:06 EDT ?Ventricular Rate:  60 ?PR Interval:  191 ?QRS Duration: 147 ?QT Interval:  489 ?QTC Calculation: 489 ?R Axis:   -77 ?Text Interpretation: Sinus rhythm Nonspecific IVCD with LAD Left ventricular hypertrophy Anterolateral infarct, age indeterminate Confirmed by Thamas Jaegers ([id number]) on 10/12/2021 6:22:17 PM ? ?Radiology ?CT Head Wo Contrast ? ?Result Date: 10/12/2021 ?CLINICAL DATA:  Increased RIGHT side weakness, dementia, neurological deficit, question stroke EXAM: CT HEAD WITHOUT CONTRAST TECHNIQUE: Contiguous  axial images were obtained from the base of the skull through the vertex without intravenous contrast. RADIATION DOSE REDUCTION: This exam was performed according to the departmental dose-optimization program which includes automated exposure control, adjustment of the mA and/or kV according to patient size and/or use of iterative reconstruction technique. COMPARISON:  03/04/2021 FINDINGS: Brain: Generalized atrophy. Normal ventricular morphology. No midline shift or mass effect. Small vessel chronic ischemic changes of deep cerebral white matter. Probable tiny old lacunar infarct at LEFT caudate head. No intracranial hemorrhage, mass lesion, evidence of acute infarction, or extra-axial fluid collection. Vascular: Atherosclerotic calcification of internal carotid and vertebral arteries at skull base. No hyperdense vessels. Skull: Intact Sinuses/Orbits: Clear Other: N/A IMPRESSION: Atrophy with small vessel chronic ischemic changes of deep cerebral white matter. Probable tiny old lacunar infarct at LEFT caudate head. No acute intracranial abnormalities. Electronically Signed   By: Lavonia Dana M.D.   On: 10/12/2021 16:36   ? ?Procedures ?Procedures  ? ? ?Medications Ordered in ED ?Medications  ?sodium chloride 0.9 % bolus 500 mL (0 mLs Intravenous  Stopped 10/12/21 1814)  ? ? ?ED Course/ Medical Decision Making/ A&P ?  ?                        ?Medical Decision Making ?Amount and/or Complexity of Data Reviewed ?Labs: ordered. ?Radiology: ordered. ? ? ?History obtained fro

## 2021-10-12 NOTE — ED Notes (Signed)
Pt to CT

## 2021-10-26 NOTE — Progress Notes (Signed)
Remote pacemaker transmission.   

## 2021-11-10 ENCOUNTER — Encounter (HOSPITAL_COMMUNITY): Payer: Self-pay | Admitting: Hematology

## 2021-11-29 ENCOUNTER — Encounter (HOSPITAL_COMMUNITY): Payer: Self-pay | Admitting: Hematology

## 2021-12-14 ENCOUNTER — Encounter (HOSPITAL_COMMUNITY): Payer: Self-pay | Admitting: Hematology

## 2022-01-03 DIAGNOSIS — N189 Chronic kidney disease, unspecified: Secondary | ICD-10-CM | POA: Diagnosis not present

## 2022-01-03 DIAGNOSIS — E785 Hyperlipidemia, unspecified: Secondary | ICD-10-CM | POA: Diagnosis not present

## 2022-01-03 DIAGNOSIS — K5900 Constipation, unspecified: Secondary | ICD-10-CM | POA: Diagnosis not present

## 2022-01-03 DIAGNOSIS — I10 Essential (primary) hypertension: Secondary | ICD-10-CM | POA: Diagnosis not present

## 2022-01-03 DIAGNOSIS — I509 Heart failure, unspecified: Secondary | ICD-10-CM | POA: Diagnosis not present

## 2022-01-03 DIAGNOSIS — D649 Anemia, unspecified: Secondary | ICD-10-CM | POA: Diagnosis not present

## 2022-01-04 ENCOUNTER — Encounter (HOSPITAL_COMMUNITY): Payer: Self-pay | Admitting: Hematology

## 2022-01-09 DIAGNOSIS — M6281 Muscle weakness (generalized): Secondary | ICD-10-CM | POA: Diagnosis not present

## 2022-01-09 DIAGNOSIS — J449 Chronic obstructive pulmonary disease, unspecified: Secondary | ICD-10-CM | POA: Diagnosis not present

## 2022-01-09 DIAGNOSIS — R262 Difficulty in walking, not elsewhere classified: Secondary | ICD-10-CM | POA: Diagnosis not present

## 2022-01-09 DIAGNOSIS — I509 Heart failure, unspecified: Secondary | ICD-10-CM | POA: Diagnosis not present

## 2022-01-09 DIAGNOSIS — F02818 Dementia in other diseases classified elsewhere, unspecified severity, with other behavioral disturbance: Secondary | ICD-10-CM | POA: Diagnosis not present

## 2022-01-09 DIAGNOSIS — R1310 Dysphagia, unspecified: Secondary | ICD-10-CM | POA: Diagnosis not present

## 2022-01-09 DIAGNOSIS — R2689 Other abnormalities of gait and mobility: Secondary | ICD-10-CM | POA: Diagnosis not present

## 2022-01-10 DIAGNOSIS — M6281 Muscle weakness (generalized): Secondary | ICD-10-CM | POA: Diagnosis not present

## 2022-01-10 DIAGNOSIS — G894 Chronic pain syndrome: Secondary | ICD-10-CM | POA: Diagnosis not present

## 2022-01-10 DIAGNOSIS — R262 Difficulty in walking, not elsewhere classified: Secondary | ICD-10-CM | POA: Diagnosis not present

## 2022-01-10 DIAGNOSIS — R2689 Other abnormalities of gait and mobility: Secondary | ICD-10-CM | POA: Diagnosis not present

## 2022-01-10 DIAGNOSIS — R1310 Dysphagia, unspecified: Secondary | ICD-10-CM | POA: Diagnosis not present

## 2022-01-10 DIAGNOSIS — I509 Heart failure, unspecified: Secondary | ICD-10-CM | POA: Diagnosis not present

## 2022-01-12 DIAGNOSIS — M6281 Muscle weakness (generalized): Secondary | ICD-10-CM | POA: Diagnosis not present

## 2022-01-12 DIAGNOSIS — R1310 Dysphagia, unspecified: Secondary | ICD-10-CM | POA: Diagnosis not present

## 2022-01-12 DIAGNOSIS — I509 Heart failure, unspecified: Secondary | ICD-10-CM | POA: Diagnosis not present

## 2022-01-12 DIAGNOSIS — R262 Difficulty in walking, not elsewhere classified: Secondary | ICD-10-CM | POA: Diagnosis not present

## 2022-01-12 DIAGNOSIS — R2689 Other abnormalities of gait and mobility: Secondary | ICD-10-CM | POA: Diagnosis not present

## 2022-01-13 DIAGNOSIS — M6281 Muscle weakness (generalized): Secondary | ICD-10-CM | POA: Diagnosis not present

## 2022-01-13 DIAGNOSIS — I509 Heart failure, unspecified: Secondary | ICD-10-CM | POA: Diagnosis not present

## 2022-01-13 DIAGNOSIS — R262 Difficulty in walking, not elsewhere classified: Secondary | ICD-10-CM | POA: Diagnosis not present

## 2022-01-13 DIAGNOSIS — R1310 Dysphagia, unspecified: Secondary | ICD-10-CM | POA: Diagnosis not present

## 2022-01-13 DIAGNOSIS — R2689 Other abnormalities of gait and mobility: Secondary | ICD-10-CM | POA: Diagnosis not present

## 2022-01-16 DIAGNOSIS — R262 Difficulty in walking, not elsewhere classified: Secondary | ICD-10-CM | POA: Diagnosis not present

## 2022-01-16 DIAGNOSIS — R2689 Other abnormalities of gait and mobility: Secondary | ICD-10-CM | POA: Diagnosis not present

## 2022-01-16 DIAGNOSIS — I509 Heart failure, unspecified: Secondary | ICD-10-CM | POA: Diagnosis not present

## 2022-01-16 DIAGNOSIS — R1310 Dysphagia, unspecified: Secondary | ICD-10-CM | POA: Diagnosis not present

## 2022-01-16 DIAGNOSIS — M6281 Muscle weakness (generalized): Secondary | ICD-10-CM | POA: Diagnosis not present

## 2022-01-17 DIAGNOSIS — I509 Heart failure, unspecified: Secondary | ICD-10-CM | POA: Diagnosis not present

## 2022-01-17 DIAGNOSIS — R2689 Other abnormalities of gait and mobility: Secondary | ICD-10-CM | POA: Diagnosis not present

## 2022-01-17 DIAGNOSIS — M6281 Muscle weakness (generalized): Secondary | ICD-10-CM | POA: Diagnosis not present

## 2022-01-17 DIAGNOSIS — R262 Difficulty in walking, not elsewhere classified: Secondary | ICD-10-CM | POA: Diagnosis not present

## 2022-01-17 DIAGNOSIS — R1310 Dysphagia, unspecified: Secondary | ICD-10-CM | POA: Diagnosis not present

## 2022-01-18 DIAGNOSIS — R1310 Dysphagia, unspecified: Secondary | ICD-10-CM | POA: Diagnosis not present

## 2022-01-18 DIAGNOSIS — R262 Difficulty in walking, not elsewhere classified: Secondary | ICD-10-CM | POA: Diagnosis not present

## 2022-01-18 DIAGNOSIS — I509 Heart failure, unspecified: Secondary | ICD-10-CM | POA: Diagnosis not present

## 2022-01-18 DIAGNOSIS — M6281 Muscle weakness (generalized): Secondary | ICD-10-CM | POA: Diagnosis not present

## 2022-01-18 DIAGNOSIS — R2689 Other abnormalities of gait and mobility: Secondary | ICD-10-CM | POA: Diagnosis not present

## 2022-01-19 DIAGNOSIS — F32A Depression, unspecified: Secondary | ICD-10-CM | POA: Diagnosis not present

## 2022-01-19 DIAGNOSIS — M6281 Muscle weakness (generalized): Secondary | ICD-10-CM | POA: Diagnosis not present

## 2022-01-19 DIAGNOSIS — R2689 Other abnormalities of gait and mobility: Secondary | ICD-10-CM | POA: Diagnosis not present

## 2022-01-19 DIAGNOSIS — E785 Hyperlipidemia, unspecified: Secondary | ICD-10-CM | POA: Diagnosis not present

## 2022-01-19 DIAGNOSIS — R1310 Dysphagia, unspecified: Secondary | ICD-10-CM | POA: Diagnosis not present

## 2022-01-19 DIAGNOSIS — R262 Difficulty in walking, not elsewhere classified: Secondary | ICD-10-CM | POA: Diagnosis not present

## 2022-01-19 DIAGNOSIS — N189 Chronic kidney disease, unspecified: Secondary | ICD-10-CM | POA: Diagnosis not present

## 2022-01-19 DIAGNOSIS — I509 Heart failure, unspecified: Secondary | ICD-10-CM | POA: Diagnosis not present

## 2022-01-19 DIAGNOSIS — R9431 Abnormal electrocardiogram [ECG] [EKG]: Secondary | ICD-10-CM | POA: Diagnosis not present

## 2022-01-19 DIAGNOSIS — G894 Chronic pain syndrome: Secondary | ICD-10-CM | POA: Diagnosis not present

## 2022-01-20 DIAGNOSIS — I509 Heart failure, unspecified: Secondary | ICD-10-CM | POA: Diagnosis not present

## 2022-01-20 DIAGNOSIS — R262 Difficulty in walking, not elsewhere classified: Secondary | ICD-10-CM | POA: Diagnosis not present

## 2022-01-20 DIAGNOSIS — M6281 Muscle weakness (generalized): Secondary | ICD-10-CM | POA: Diagnosis not present

## 2022-01-20 DIAGNOSIS — R2689 Other abnormalities of gait and mobility: Secondary | ICD-10-CM | POA: Diagnosis not present

## 2022-01-20 DIAGNOSIS — R1310 Dysphagia, unspecified: Secondary | ICD-10-CM | POA: Diagnosis not present

## 2022-01-23 ENCOUNTER — Encounter (HOSPITAL_COMMUNITY): Payer: Self-pay | Admitting: Hematology

## 2022-01-23 DIAGNOSIS — R262 Difficulty in walking, not elsewhere classified: Secondary | ICD-10-CM | POA: Diagnosis not present

## 2022-01-23 DIAGNOSIS — R634 Abnormal weight loss: Secondary | ICD-10-CM | POA: Diagnosis not present

## 2022-01-23 DIAGNOSIS — I509 Heart failure, unspecified: Secondary | ICD-10-CM | POA: Diagnosis not present

## 2022-01-23 DIAGNOSIS — M6281 Muscle weakness (generalized): Secondary | ICD-10-CM | POA: Diagnosis not present

## 2022-01-23 DIAGNOSIS — R1310 Dysphagia, unspecified: Secondary | ICD-10-CM | POA: Diagnosis not present

## 2022-01-23 DIAGNOSIS — R2689 Other abnormalities of gait and mobility: Secondary | ICD-10-CM | POA: Diagnosis not present

## 2022-01-24 DIAGNOSIS — R1310 Dysphagia, unspecified: Secondary | ICD-10-CM | POA: Diagnosis not present

## 2022-01-24 DIAGNOSIS — M6281 Muscle weakness (generalized): Secondary | ICD-10-CM | POA: Diagnosis not present

## 2022-01-24 DIAGNOSIS — I509 Heart failure, unspecified: Secondary | ICD-10-CM | POA: Diagnosis not present

## 2022-01-24 DIAGNOSIS — R2689 Other abnormalities of gait and mobility: Secondary | ICD-10-CM | POA: Diagnosis not present

## 2022-01-24 DIAGNOSIS — R262 Difficulty in walking, not elsewhere classified: Secondary | ICD-10-CM | POA: Diagnosis not present

## 2022-01-25 DIAGNOSIS — R262 Difficulty in walking, not elsewhere classified: Secondary | ICD-10-CM | POA: Diagnosis not present

## 2022-01-25 DIAGNOSIS — I509 Heart failure, unspecified: Secondary | ICD-10-CM | POA: Diagnosis not present

## 2022-01-25 DIAGNOSIS — R2689 Other abnormalities of gait and mobility: Secondary | ICD-10-CM | POA: Diagnosis not present

## 2022-01-25 DIAGNOSIS — M6281 Muscle weakness (generalized): Secondary | ICD-10-CM | POA: Diagnosis not present

## 2022-01-25 DIAGNOSIS — I10 Essential (primary) hypertension: Secondary | ICD-10-CM | POA: Diagnosis not present

## 2022-01-25 DIAGNOSIS — R1310 Dysphagia, unspecified: Secondary | ICD-10-CM | POA: Diagnosis not present

## 2022-01-25 DIAGNOSIS — J449 Chronic obstructive pulmonary disease, unspecified: Secondary | ICD-10-CM | POA: Diagnosis not present

## 2022-01-26 DIAGNOSIS — R2689 Other abnormalities of gait and mobility: Secondary | ICD-10-CM | POA: Diagnosis not present

## 2022-01-26 DIAGNOSIS — J449 Chronic obstructive pulmonary disease, unspecified: Secondary | ICD-10-CM | POA: Diagnosis not present

## 2022-01-26 DIAGNOSIS — I509 Heart failure, unspecified: Secondary | ICD-10-CM | POA: Diagnosis not present

## 2022-01-26 DIAGNOSIS — C3411 Malignant neoplasm of upper lobe, right bronchus or lung: Secondary | ICD-10-CM | POA: Diagnosis not present

## 2022-01-26 DIAGNOSIS — G894 Chronic pain syndrome: Secondary | ICD-10-CM | POA: Diagnosis not present

## 2022-01-26 DIAGNOSIS — M6281 Muscle weakness (generalized): Secondary | ICD-10-CM | POA: Diagnosis not present

## 2022-01-26 DIAGNOSIS — R1310 Dysphagia, unspecified: Secondary | ICD-10-CM | POA: Diagnosis not present

## 2022-01-26 DIAGNOSIS — R262 Difficulty in walking, not elsewhere classified: Secondary | ICD-10-CM | POA: Diagnosis not present

## 2022-01-30 DIAGNOSIS — R1310 Dysphagia, unspecified: Secondary | ICD-10-CM | POA: Diagnosis not present

## 2022-01-30 DIAGNOSIS — R262 Difficulty in walking, not elsewhere classified: Secondary | ICD-10-CM | POA: Diagnosis not present

## 2022-01-30 DIAGNOSIS — M6281 Muscle weakness (generalized): Secondary | ICD-10-CM | POA: Diagnosis not present

## 2022-01-30 DIAGNOSIS — I509 Heart failure, unspecified: Secondary | ICD-10-CM | POA: Diagnosis not present

## 2022-01-30 DIAGNOSIS — R2689 Other abnormalities of gait and mobility: Secondary | ICD-10-CM | POA: Diagnosis not present

## 2022-01-31 DIAGNOSIS — I509 Heart failure, unspecified: Secondary | ICD-10-CM | POA: Diagnosis not present

## 2022-01-31 DIAGNOSIS — R2689 Other abnormalities of gait and mobility: Secondary | ICD-10-CM | POA: Diagnosis not present

## 2022-01-31 DIAGNOSIS — M6281 Muscle weakness (generalized): Secondary | ICD-10-CM | POA: Diagnosis not present

## 2022-01-31 DIAGNOSIS — R1310 Dysphagia, unspecified: Secondary | ICD-10-CM | POA: Diagnosis not present

## 2022-01-31 DIAGNOSIS — R262 Difficulty in walking, not elsewhere classified: Secondary | ICD-10-CM | POA: Diagnosis not present

## 2022-02-02 DIAGNOSIS — I509 Heart failure, unspecified: Secondary | ICD-10-CM | POA: Diagnosis not present

## 2022-02-02 DIAGNOSIS — M6281 Muscle weakness (generalized): Secondary | ICD-10-CM | POA: Diagnosis not present

## 2022-02-02 DIAGNOSIS — R1310 Dysphagia, unspecified: Secondary | ICD-10-CM | POA: Diagnosis not present

## 2022-02-02 DIAGNOSIS — R262 Difficulty in walking, not elsewhere classified: Secondary | ICD-10-CM | POA: Diagnosis not present

## 2022-02-02 DIAGNOSIS — R2689 Other abnormalities of gait and mobility: Secondary | ICD-10-CM | POA: Diagnosis not present

## 2022-02-03 DIAGNOSIS — M6281 Muscle weakness (generalized): Secondary | ICD-10-CM | POA: Diagnosis not present

## 2022-02-03 DIAGNOSIS — I509 Heart failure, unspecified: Secondary | ICD-10-CM | POA: Diagnosis not present

## 2022-02-03 DIAGNOSIS — R262 Difficulty in walking, not elsewhere classified: Secondary | ICD-10-CM | POA: Diagnosis not present

## 2022-02-03 DIAGNOSIS — R2689 Other abnormalities of gait and mobility: Secondary | ICD-10-CM | POA: Diagnosis not present

## 2022-02-06 DIAGNOSIS — M6281 Muscle weakness (generalized): Secondary | ICD-10-CM | POA: Diagnosis not present

## 2022-02-06 DIAGNOSIS — R2689 Other abnormalities of gait and mobility: Secondary | ICD-10-CM | POA: Diagnosis not present

## 2022-02-06 DIAGNOSIS — I509 Heart failure, unspecified: Secondary | ICD-10-CM | POA: Diagnosis not present

## 2022-02-06 DIAGNOSIS — R531 Weakness: Secondary | ICD-10-CM | POA: Diagnosis not present

## 2022-02-06 DIAGNOSIS — R262 Difficulty in walking, not elsewhere classified: Secondary | ICD-10-CM | POA: Diagnosis not present

## 2022-02-07 ENCOUNTER — Ambulatory Visit (INDEPENDENT_AMBULATORY_CARE_PROVIDER_SITE_OTHER): Payer: Medicare Other

## 2022-02-07 DIAGNOSIS — R262 Difficulty in walking, not elsewhere classified: Secondary | ICD-10-CM | POA: Diagnosis not present

## 2022-02-07 DIAGNOSIS — M6281 Muscle weakness (generalized): Secondary | ICD-10-CM | POA: Diagnosis not present

## 2022-02-07 DIAGNOSIS — I10 Essential (primary) hypertension: Secondary | ICD-10-CM | POA: Diagnosis not present

## 2022-02-07 DIAGNOSIS — I509 Heart failure, unspecified: Secondary | ICD-10-CM | POA: Diagnosis not present

## 2022-02-07 DIAGNOSIS — R2689 Other abnormalities of gait and mobility: Secondary | ICD-10-CM | POA: Diagnosis not present

## 2022-02-07 DIAGNOSIS — I442 Atrioventricular block, complete: Secondary | ICD-10-CM | POA: Diagnosis not present

## 2022-02-07 LAB — CUP PACEART REMOTE DEVICE CHECK
Battery Remaining Longevity: 37 mo
Battery Remaining Percentage: 36 %
Battery Voltage: 2.95 V
Brady Statistic AP VP Percent: 74 %
Brady Statistic AP VS Percent: 1.7 %
Brady Statistic AS VP Percent: 24 %
Brady Statistic AS VS Percent: 1 %
Brady Statistic RA Percent Paced: 73 %
Brady Statistic RV Percent Paced: 98 %
Date Time Interrogation Session: 20230903234450
Implantable Lead Implant Date: 20171020
Implantable Lead Implant Date: 20171020
Implantable Lead Location: 753859
Implantable Lead Location: 753860
Implantable Pulse Generator Implant Date: 20171020
Lead Channel Impedance Value: 380 Ohm
Lead Channel Impedance Value: 410 Ohm
Lead Channel Pacing Threshold Amplitude: 0.75 V
Lead Channel Pacing Threshold Amplitude: 1 V
Lead Channel Pacing Threshold Pulse Width: 0.4 ms
Lead Channel Pacing Threshold Pulse Width: 0.4 ms
Lead Channel Sensing Intrinsic Amplitude: 1.5 mV
Lead Channel Sensing Intrinsic Amplitude: 8.5 mV
Lead Channel Setting Pacing Amplitude: 2 V
Lead Channel Setting Pacing Amplitude: 2.5 V
Lead Channel Setting Pacing Pulse Width: 0.4 ms
Lead Channel Setting Sensing Sensitivity: 3.5 mV
Pulse Gen Model: 2272
Pulse Gen Serial Number: 3180497

## 2022-02-08 DIAGNOSIS — R531 Weakness: Secondary | ICD-10-CM | POA: Diagnosis not present

## 2022-02-09 DIAGNOSIS — R262 Difficulty in walking, not elsewhere classified: Secondary | ICD-10-CM | POA: Diagnosis not present

## 2022-02-09 DIAGNOSIS — I509 Heart failure, unspecified: Secondary | ICD-10-CM | POA: Diagnosis not present

## 2022-02-09 DIAGNOSIS — R2689 Other abnormalities of gait and mobility: Secondary | ICD-10-CM | POA: Diagnosis not present

## 2022-02-09 DIAGNOSIS — M6281 Muscle weakness (generalized): Secondary | ICD-10-CM | POA: Diagnosis not present

## 2022-02-10 DIAGNOSIS — N390 Urinary tract infection, site not specified: Secondary | ICD-10-CM | POA: Diagnosis not present

## 2022-02-13 DIAGNOSIS — I509 Heart failure, unspecified: Secondary | ICD-10-CM | POA: Diagnosis not present

## 2022-02-13 DIAGNOSIS — M6281 Muscle weakness (generalized): Secondary | ICD-10-CM | POA: Diagnosis not present

## 2022-02-13 DIAGNOSIS — R262 Difficulty in walking, not elsewhere classified: Secondary | ICD-10-CM | POA: Diagnosis not present

## 2022-02-13 DIAGNOSIS — R2689 Other abnormalities of gait and mobility: Secondary | ICD-10-CM | POA: Diagnosis not present

## 2022-02-14 DIAGNOSIS — M6281 Muscle weakness (generalized): Secondary | ICD-10-CM | POA: Diagnosis not present

## 2022-02-14 DIAGNOSIS — R262 Difficulty in walking, not elsewhere classified: Secondary | ICD-10-CM | POA: Diagnosis not present

## 2022-02-14 DIAGNOSIS — R2689 Other abnormalities of gait and mobility: Secondary | ICD-10-CM | POA: Diagnosis not present

## 2022-02-14 DIAGNOSIS — I509 Heart failure, unspecified: Secondary | ICD-10-CM | POA: Diagnosis not present

## 2022-02-15 DIAGNOSIS — G894 Chronic pain syndrome: Secondary | ICD-10-CM | POA: Diagnosis not present

## 2022-02-16 DIAGNOSIS — M6281 Muscle weakness (generalized): Secondary | ICD-10-CM | POA: Diagnosis not present

## 2022-02-16 DIAGNOSIS — R262 Difficulty in walking, not elsewhere classified: Secondary | ICD-10-CM | POA: Diagnosis not present

## 2022-02-16 DIAGNOSIS — I509 Heart failure, unspecified: Secondary | ICD-10-CM | POA: Diagnosis not present

## 2022-02-16 DIAGNOSIS — R2689 Other abnormalities of gait and mobility: Secondary | ICD-10-CM | POA: Diagnosis not present

## 2022-02-17 DIAGNOSIS — N390 Urinary tract infection, site not specified: Secondary | ICD-10-CM | POA: Diagnosis not present

## 2022-02-20 DIAGNOSIS — I509 Heart failure, unspecified: Secondary | ICD-10-CM | POA: Diagnosis not present

## 2022-02-20 DIAGNOSIS — R2689 Other abnormalities of gait and mobility: Secondary | ICD-10-CM | POA: Diagnosis not present

## 2022-02-20 DIAGNOSIS — R262 Difficulty in walking, not elsewhere classified: Secondary | ICD-10-CM | POA: Diagnosis not present

## 2022-02-20 DIAGNOSIS — M6281 Muscle weakness (generalized): Secondary | ICD-10-CM | POA: Diagnosis not present

## 2022-02-21 ENCOUNTER — Encounter (HOSPITAL_COMMUNITY): Payer: Self-pay | Admitting: Hematology

## 2022-02-21 DIAGNOSIS — E785 Hyperlipidemia, unspecified: Secondary | ICD-10-CM | POA: Diagnosis not present

## 2022-02-21 DIAGNOSIS — R262 Difficulty in walking, not elsewhere classified: Secondary | ICD-10-CM | POA: Diagnosis not present

## 2022-02-21 DIAGNOSIS — M6281 Muscle weakness (generalized): Secondary | ICD-10-CM | POA: Diagnosis not present

## 2022-02-21 DIAGNOSIS — R9431 Abnormal electrocardiogram [ECG] [EKG]: Secondary | ICD-10-CM | POA: Diagnosis not present

## 2022-02-21 DIAGNOSIS — G8929 Other chronic pain: Secondary | ICD-10-CM | POA: Diagnosis not present

## 2022-02-21 DIAGNOSIS — R2689 Other abnormalities of gait and mobility: Secondary | ICD-10-CM | POA: Diagnosis not present

## 2022-02-21 DIAGNOSIS — I509 Heart failure, unspecified: Secondary | ICD-10-CM | POA: Diagnosis not present

## 2022-02-21 DIAGNOSIS — F32A Depression, unspecified: Secondary | ICD-10-CM | POA: Diagnosis not present

## 2022-02-22 ENCOUNTER — Encounter (HOSPITAL_COMMUNITY): Payer: Self-pay | Admitting: Hematology

## 2022-02-22 DIAGNOSIS — M79674 Pain in right toe(s): Secondary | ICD-10-CM | POA: Diagnosis not present

## 2022-02-22 DIAGNOSIS — M79675 Pain in left toe(s): Secondary | ICD-10-CM | POA: Diagnosis not present

## 2022-02-22 DIAGNOSIS — B351 Tinea unguium: Secondary | ICD-10-CM | POA: Diagnosis not present

## 2022-02-23 DIAGNOSIS — R2689 Other abnormalities of gait and mobility: Secondary | ICD-10-CM | POA: Diagnosis not present

## 2022-02-23 DIAGNOSIS — M6281 Muscle weakness (generalized): Secondary | ICD-10-CM | POA: Diagnosis not present

## 2022-02-23 DIAGNOSIS — I509 Heart failure, unspecified: Secondary | ICD-10-CM | POA: Diagnosis not present

## 2022-02-23 DIAGNOSIS — R262 Difficulty in walking, not elsewhere classified: Secondary | ICD-10-CM | POA: Diagnosis not present

## 2022-02-24 DIAGNOSIS — R262 Difficulty in walking, not elsewhere classified: Secondary | ICD-10-CM | POA: Diagnosis not present

## 2022-02-24 DIAGNOSIS — I509 Heart failure, unspecified: Secondary | ICD-10-CM | POA: Diagnosis not present

## 2022-02-24 DIAGNOSIS — R2689 Other abnormalities of gait and mobility: Secondary | ICD-10-CM | POA: Diagnosis not present

## 2022-02-24 DIAGNOSIS — M6281 Muscle weakness (generalized): Secondary | ICD-10-CM | POA: Diagnosis not present

## 2022-02-27 DIAGNOSIS — R2689 Other abnormalities of gait and mobility: Secondary | ICD-10-CM | POA: Diagnosis not present

## 2022-02-27 DIAGNOSIS — M6281 Muscle weakness (generalized): Secondary | ICD-10-CM | POA: Diagnosis not present

## 2022-02-27 DIAGNOSIS — R262 Difficulty in walking, not elsewhere classified: Secondary | ICD-10-CM | POA: Diagnosis not present

## 2022-02-27 DIAGNOSIS — I509 Heart failure, unspecified: Secondary | ICD-10-CM | POA: Diagnosis not present

## 2022-02-27 NOTE — Progress Notes (Signed)
No chief complaint on file.

## 2022-02-28 DIAGNOSIS — R262 Difficulty in walking, not elsewhere classified: Secondary | ICD-10-CM | POA: Diagnosis not present

## 2022-02-28 DIAGNOSIS — R2689 Other abnormalities of gait and mobility: Secondary | ICD-10-CM | POA: Diagnosis not present

## 2022-02-28 DIAGNOSIS — I509 Heart failure, unspecified: Secondary | ICD-10-CM | POA: Diagnosis not present

## 2022-02-28 DIAGNOSIS — M6281 Muscle weakness (generalized): Secondary | ICD-10-CM | POA: Diagnosis not present

## 2022-03-01 DIAGNOSIS — R2689 Other abnormalities of gait and mobility: Secondary | ICD-10-CM | POA: Diagnosis not present

## 2022-03-01 DIAGNOSIS — M6281 Muscle weakness (generalized): Secondary | ICD-10-CM | POA: Diagnosis not present

## 2022-03-01 DIAGNOSIS — I509 Heart failure, unspecified: Secondary | ICD-10-CM | POA: Diagnosis not present

## 2022-03-01 DIAGNOSIS — R262 Difficulty in walking, not elsewhere classified: Secondary | ICD-10-CM | POA: Diagnosis not present

## 2022-03-02 DIAGNOSIS — I509 Heart failure, unspecified: Secondary | ICD-10-CM | POA: Diagnosis not present

## 2022-03-02 DIAGNOSIS — M6281 Muscle weakness (generalized): Secondary | ICD-10-CM | POA: Diagnosis not present

## 2022-03-02 DIAGNOSIS — R262 Difficulty in walking, not elsewhere classified: Secondary | ICD-10-CM | POA: Diagnosis not present

## 2022-03-02 DIAGNOSIS — R2689 Other abnormalities of gait and mobility: Secondary | ICD-10-CM | POA: Diagnosis not present

## 2022-03-02 NOTE — Progress Notes (Signed)
Remote pacemaker transmission.   

## 2022-03-03 ENCOUNTER — Encounter (HOSPITAL_COMMUNITY): Payer: Self-pay | Admitting: Hematology

## 2022-03-07 DIAGNOSIS — C3411 Malignant neoplasm of upper lobe, right bronchus or lung: Secondary | ICD-10-CM | POA: Diagnosis not present

## 2022-03-07 DIAGNOSIS — J449 Chronic obstructive pulmonary disease, unspecified: Secondary | ICD-10-CM | POA: Diagnosis not present

## 2022-03-17 DIAGNOSIS — G894 Chronic pain syndrome: Secondary | ICD-10-CM | POA: Diagnosis not present

## 2022-03-20 DIAGNOSIS — G894 Chronic pain syndrome: Secondary | ICD-10-CM | POA: Diagnosis not present

## 2022-03-28 DIAGNOSIS — F32A Depression, unspecified: Secondary | ICD-10-CM | POA: Diagnosis not present

## 2022-03-28 DIAGNOSIS — K5900 Constipation, unspecified: Secondary | ICD-10-CM | POA: Diagnosis not present

## 2022-03-28 DIAGNOSIS — I509 Heart failure, unspecified: Secondary | ICD-10-CM | POA: Diagnosis not present

## 2022-03-28 DIAGNOSIS — E785 Hyperlipidemia, unspecified: Secondary | ICD-10-CM | POA: Diagnosis not present

## 2022-03-28 DIAGNOSIS — G894 Chronic pain syndrome: Secondary | ICD-10-CM | POA: Diagnosis not present

## 2022-03-29 DIAGNOSIS — I10 Essential (primary) hypertension: Secondary | ICD-10-CM | POA: Diagnosis not present

## 2022-03-29 DIAGNOSIS — N189 Chronic kidney disease, unspecified: Secondary | ICD-10-CM | POA: Diagnosis not present

## 2022-03-29 DIAGNOSIS — K219 Gastro-esophageal reflux disease without esophagitis: Secondary | ICD-10-CM | POA: Diagnosis not present

## 2022-03-29 DIAGNOSIS — K5900 Constipation, unspecified: Secondary | ICD-10-CM | POA: Diagnosis not present

## 2022-03-29 DIAGNOSIS — G894 Chronic pain syndrome: Secondary | ICD-10-CM | POA: Diagnosis not present

## 2022-03-29 DIAGNOSIS — Z760 Encounter for issue of repeat prescription: Secondary | ICD-10-CM | POA: Diagnosis not present

## 2022-03-29 DIAGNOSIS — Z79899 Other long term (current) drug therapy: Secondary | ICD-10-CM | POA: Diagnosis not present

## 2022-03-29 DIAGNOSIS — E785 Hyperlipidemia, unspecified: Secondary | ICD-10-CM | POA: Diagnosis not present

## 2022-04-04 DIAGNOSIS — R262 Difficulty in walking, not elsewhere classified: Secondary | ICD-10-CM | POA: Diagnosis not present

## 2022-04-04 DIAGNOSIS — M6281 Muscle weakness (generalized): Secondary | ICD-10-CM | POA: Diagnosis not present

## 2022-04-04 DIAGNOSIS — I509 Heart failure, unspecified: Secondary | ICD-10-CM | POA: Diagnosis not present

## 2022-04-04 DIAGNOSIS — R2689 Other abnormalities of gait and mobility: Secondary | ICD-10-CM | POA: Diagnosis not present

## 2022-04-05 DIAGNOSIS — 419620001 Death: Secondary | SNOMED CT | POA: Diagnosis not present

## 2022-04-05 DIAGNOSIS — I10 Essential (primary) hypertension: Secondary | ICD-10-CM | POA: Diagnosis not present

## 2022-04-05 DIAGNOSIS — Z9181 History of falling: Secondary | ICD-10-CM | POA: Diagnosis not present

## 2022-04-05 DIAGNOSIS — F32A Depression, unspecified: Secondary | ICD-10-CM | POA: Diagnosis not present

## 2022-04-05 DIAGNOSIS — M6281 Muscle weakness (generalized): Secondary | ICD-10-CM | POA: Diagnosis not present

## 2022-04-05 DIAGNOSIS — Z79899 Other long term (current) drug therapy: Secondary | ICD-10-CM | POA: Diagnosis not present

## 2022-04-05 DIAGNOSIS — K5900 Constipation, unspecified: Secondary | ICD-10-CM | POA: Diagnosis not present

## 2022-04-05 DIAGNOSIS — I509 Heart failure, unspecified: Secondary | ICD-10-CM | POA: Diagnosis not present

## 2022-04-05 DIAGNOSIS — E785 Hyperlipidemia, unspecified: Secondary | ICD-10-CM | POA: Diagnosis not present

## 2022-04-05 DIAGNOSIS — K219 Gastro-esophageal reflux disease without esophagitis: Secondary | ICD-10-CM | POA: Diagnosis not present

## 2022-04-05 DIAGNOSIS — R262 Difficulty in walking, not elsewhere classified: Secondary | ICD-10-CM | POA: Diagnosis not present

## 2022-04-05 DIAGNOSIS — R2689 Other abnormalities of gait and mobility: Secondary | ICD-10-CM | POA: Diagnosis not present

## 2022-04-05 DEATH — deceased

## 2022-04-06 DIAGNOSIS — I509 Heart failure, unspecified: Secondary | ICD-10-CM | POA: Diagnosis not present

## 2022-04-06 DIAGNOSIS — R2689 Other abnormalities of gait and mobility: Secondary | ICD-10-CM | POA: Diagnosis not present

## 2022-04-06 DIAGNOSIS — R262 Difficulty in walking, not elsewhere classified: Secondary | ICD-10-CM | POA: Diagnosis not present

## 2022-04-06 DIAGNOSIS — M6281 Muscle weakness (generalized): Secondary | ICD-10-CM | POA: Diagnosis not present

## 2022-04-09 DIAGNOSIS — R2689 Other abnormalities of gait and mobility: Secondary | ICD-10-CM | POA: Diagnosis not present

## 2022-04-09 DIAGNOSIS — M6281 Muscle weakness (generalized): Secondary | ICD-10-CM | POA: Diagnosis not present

## 2022-04-09 DIAGNOSIS — I509 Heart failure, unspecified: Secondary | ICD-10-CM | POA: Diagnosis not present

## 2022-04-09 DIAGNOSIS — R262 Difficulty in walking, not elsewhere classified: Secondary | ICD-10-CM | POA: Diagnosis not present

## 2022-04-10 DIAGNOSIS — R2689 Other abnormalities of gait and mobility: Secondary | ICD-10-CM | POA: Diagnosis not present

## 2022-04-10 DIAGNOSIS — R262 Difficulty in walking, not elsewhere classified: Secondary | ICD-10-CM | POA: Diagnosis not present

## 2022-04-10 DIAGNOSIS — I509 Heart failure, unspecified: Secondary | ICD-10-CM | POA: Diagnosis not present

## 2022-04-10 DIAGNOSIS — M6281 Muscle weakness (generalized): Secondary | ICD-10-CM | POA: Diagnosis not present

## 2022-04-11 DIAGNOSIS — I509 Heart failure, unspecified: Secondary | ICD-10-CM | POA: Diagnosis not present

## 2022-04-11 DIAGNOSIS — R2689 Other abnormalities of gait and mobility: Secondary | ICD-10-CM | POA: Diagnosis not present

## 2022-04-11 DIAGNOSIS — R262 Difficulty in walking, not elsewhere classified: Secondary | ICD-10-CM | POA: Diagnosis not present

## 2022-04-11 DIAGNOSIS — M6281 Muscle weakness (generalized): Secondary | ICD-10-CM | POA: Diagnosis not present

## 2022-04-12 DIAGNOSIS — I509 Heart failure, unspecified: Secondary | ICD-10-CM | POA: Diagnosis not present

## 2022-04-12 DIAGNOSIS — R262 Difficulty in walking, not elsewhere classified: Secondary | ICD-10-CM | POA: Diagnosis not present

## 2022-04-12 DIAGNOSIS — R2689 Other abnormalities of gait and mobility: Secondary | ICD-10-CM | POA: Diagnosis not present

## 2022-04-12 DIAGNOSIS — M6281 Muscle weakness (generalized): Secondary | ICD-10-CM | POA: Diagnosis not present

## 2022-04-13 DIAGNOSIS — M6281 Muscle weakness (generalized): Secondary | ICD-10-CM | POA: Diagnosis not present

## 2022-04-13 DIAGNOSIS — I509 Heart failure, unspecified: Secondary | ICD-10-CM | POA: Diagnosis not present

## 2022-04-13 DIAGNOSIS — R2689 Other abnormalities of gait and mobility: Secondary | ICD-10-CM | POA: Diagnosis not present

## 2022-04-13 DIAGNOSIS — R262 Difficulty in walking, not elsewhere classified: Secondary | ICD-10-CM | POA: Diagnosis not present

## 2022-04-14 DIAGNOSIS — L89312 Pressure ulcer of right buttock, stage 2: Secondary | ICD-10-CM | POA: Diagnosis not present

## 2022-04-14 DIAGNOSIS — M6281 Muscle weakness (generalized): Secondary | ICD-10-CM | POA: Diagnosis not present

## 2022-04-14 DIAGNOSIS — Z7409 Other reduced mobility: Secondary | ICD-10-CM | POA: Diagnosis not present

## 2022-04-14 DIAGNOSIS — R2689 Other abnormalities of gait and mobility: Secondary | ICD-10-CM | POA: Diagnosis not present

## 2022-04-14 DIAGNOSIS — R262 Difficulty in walking, not elsewhere classified: Secondary | ICD-10-CM | POA: Diagnosis not present

## 2022-04-14 DIAGNOSIS — I509 Heart failure, unspecified: Secondary | ICD-10-CM | POA: Diagnosis not present

## 2022-04-16 ENCOUNTER — Emergency Department (HOSPITAL_COMMUNITY): Payer: Medicare Other

## 2022-04-16 ENCOUNTER — Other Ambulatory Visit: Payer: Self-pay

## 2022-04-16 ENCOUNTER — Emergency Department (HOSPITAL_COMMUNITY)
Admission: EM | Admit: 2022-04-16 | Discharge: 2022-04-16 | Disposition: A | Discharge status: Skilled Nursing Facility | Payer: Medicare Other | Attending: Emergency Medicine | Admitting: Emergency Medicine

## 2022-04-16 ENCOUNTER — Encounter (HOSPITAL_COMMUNITY): Payer: Self-pay

## 2022-04-16 DIAGNOSIS — D72829 Elevated white blood cell count, unspecified: Secondary | ICD-10-CM | POA: Insufficient documentation

## 2022-04-16 DIAGNOSIS — R0989 Other specified symptoms and signs involving the circulatory and respiratory systems: Secondary | ICD-10-CM | POA: Diagnosis not present

## 2022-04-16 DIAGNOSIS — R059 Cough, unspecified: Secondary | ICD-10-CM | POA: Insufficient documentation

## 2022-04-16 DIAGNOSIS — R0789 Other chest pain: Secondary | ICD-10-CM | POA: Diagnosis not present

## 2022-04-16 DIAGNOSIS — R944 Abnormal results of kidney function studies: Secondary | ICD-10-CM | POA: Insufficient documentation

## 2022-04-16 DIAGNOSIS — Z79899 Other long term (current) drug therapy: Secondary | ICD-10-CM | POA: Diagnosis not present

## 2022-04-16 DIAGNOSIS — J449 Chronic obstructive pulmonary disease, unspecified: Secondary | ICD-10-CM | POA: Diagnosis not present

## 2022-04-16 DIAGNOSIS — N189 Chronic kidney disease, unspecified: Secondary | ICD-10-CM | POA: Diagnosis not present

## 2022-04-16 DIAGNOSIS — Z743 Need for continuous supervision: Secondary | ICD-10-CM | POA: Diagnosis not present

## 2022-04-16 DIAGNOSIS — R051 Acute cough: Secondary | ICD-10-CM

## 2022-04-16 DIAGNOSIS — R6889 Other general symptoms and signs: Secondary | ICD-10-CM | POA: Diagnosis not present

## 2022-04-16 DIAGNOSIS — Z7951 Long term (current) use of inhaled steroids: Secondary | ICD-10-CM | POA: Insufficient documentation

## 2022-04-16 DIAGNOSIS — Z7982 Long term (current) use of aspirin: Secondary | ICD-10-CM | POA: Diagnosis not present

## 2022-04-16 DIAGNOSIS — I129 Hypertensive chronic kidney disease with stage 1 through stage 4 chronic kidney disease, or unspecified chronic kidney disease: Secondary | ICD-10-CM | POA: Insufficient documentation

## 2022-04-16 DIAGNOSIS — R52 Pain, unspecified: Secondary | ICD-10-CM | POA: Diagnosis not present

## 2022-04-16 LAB — CBC WITH DIFFERENTIAL/PLATELET
Abs Immature Granulocytes: 0.06 10*3/uL (ref 0.00–0.07)
Basophils Absolute: 0 10*3/uL (ref 0.0–0.1)
Basophils Relative: 0 %
Eosinophils Absolute: 0.1 10*3/uL (ref 0.0–0.5)
Eosinophils Relative: 0 %
HCT: 27.7 % — ABNORMAL LOW (ref 36.0–46.0)
Hemoglobin: 8.9 g/dL — ABNORMAL LOW (ref 12.0–15.0)
Immature Granulocytes: 0 %
Lymphocytes Relative: 14 %
Lymphs Abs: 2 10*3/uL (ref 0.7–4.0)
MCH: 29.7 pg (ref 26.0–34.0)
MCHC: 32.1 g/dL (ref 30.0–36.0)
MCV: 92.3 fL (ref 80.0–100.0)
Monocytes Absolute: 1.1 10*3/uL — ABNORMAL HIGH (ref 0.1–1.0)
Monocytes Relative: 8 %
Neutro Abs: 11 10*3/uL — ABNORMAL HIGH (ref 1.7–7.7)
Neutrophils Relative %: 78 %
Platelets: 240 10*3/uL (ref 150–400)
RBC: 3 MIL/uL — ABNORMAL LOW (ref 3.87–5.11)
RDW: 13.2 % (ref 11.5–15.5)
WBC: 14.2 10*3/uL — ABNORMAL HIGH (ref 4.0–10.5)
nRBC: 0 % (ref 0.0–0.2)

## 2022-04-16 LAB — BASIC METABOLIC PANEL
Anion gap: 11 (ref 5–15)
BUN: 93 mg/dL — ABNORMAL HIGH (ref 8–23)
CO2: 24 mmol/L (ref 22–32)
Calcium: 9.1 mg/dL (ref 8.9–10.3)
Chloride: 104 mmol/L (ref 98–111)
Creatinine, Ser: 3.78 mg/dL — ABNORMAL HIGH (ref 0.44–1.00)
GFR, Estimated: 11 mL/min — ABNORMAL LOW (ref >60)
Glucose, Bld: 131 mg/dL — ABNORMAL HIGH (ref 70–99)
Potassium: 4.7 mmol/L (ref 3.5–5.1)
Sodium: 139 mmol/L (ref 135–145)

## 2022-04-16 MED ORDER — DOXYCYCLINE HYCLATE 100 MG PO CAPS: 100.0000 mg | ORAL_CAPSULE | Freq: Two times a day (BID) | ORAL | 0 refills | Status: AC

## 2022-04-16 MED ORDER — DOXYCYCLINE HYCLATE 100 MG PO TABS
100.0000 mg | ORAL_TABLET | Freq: Once | ORAL | Status: AC
  Administered 2022-04-16: 100 mg via ORAL
  Filled 2022-04-16: qty 1

## 2022-04-16 NOTE — ED Notes (Signed)
Pt is awaiting transport back to [facility].  [facility] did not answer numerous times - attempted by Nira Conn, RN day shift

## 2022-04-16 NOTE — ED Notes (Signed)
Attempted to call [facility] to give report, no answer.

## 2022-04-16 NOTE — ED Triage Notes (Signed)
Pt brought to ED via [facility] from [facility]. [facility] stated her O2 sats dipped down 82%, and they placed her on 2L O2. Pt was found O2 not on but with cannula in nose. EMS O2 sat 95% Pt is palliative care and family wants checked for chest congestion.

## 2022-04-16 NOTE — ED Notes (Signed)
Attempted report to [facility], No answer

## 2022-04-16 NOTE — Discharge Instructions (Addendum)
You have been seen today for your complaint of cough. Your lab work showed a slightly high white blood cell count, was otherwise unremarkable. Your imaging was reassuring. Your discharge medications include doxycycline.  This is an antibiotic.  You should take it as prescribed.  You should take for the entire duration of the prescription.  This may cause an upset stomach.  You may take it with food. Follow up with: Primary care provider in 1 week Please seek immediate medical care if you develop any of the following symptoms: You cough up blood. You have trouble breathing. Your heartbeat is very fast. At this time there does not appear to be the presence of an emergent medical condition, however there is always the potential for conditions to change. Please read and follow the below instructions.  Do not take your medicine if  develop an itchy rash, swelling in your mouth or lips, or difficulty breathing; call 911 and seek immediate emergency medical attention if this occurs.  You may review your lab tests and imaging results in their entirety on your MyChart account.  Please discuss all results of fully with your primary care provider and other specialist at your follow-up visit.  Note: Portions of this text may have been transcribed using voice recognition software. Every effort was made to ensure accuracy; however, inadvertent computerized transcription errors may still be present.

## 2022-04-16 NOTE — ED Provider Notes (Signed)
[facility] Provider Note   CSN: [id number] Arrival date & time: 04/16/22  1535     History  Chief Complaint  Patient presents with   Chest Congestion    SHAQUEL [facility] is a 81 y.o. female.  With an extensive past medical history including but not limited to hypertension, gout, chronic kidney disease, cerebrovascular disease, complete heart block, COPD, irritable bowel syndrome, GERD, peripheral vascular disease, fibromuscular dysplasia, dementia who presents to the ED for evaluation of chest congestion.  Patient lives in an assisted living home and was found to have a pulse ox reading of 82%.  Per report, 2 L O2 via nasal cannula was placed, however when EMS arrived they reported that the O2 was not on and patient had a 95% pulse ox. Family wants patient checked for chest congestion.  Patient denies shortness of breath, chest pain, fevers or chills.  Does states she has had a cough for the past 4 days.  Cough is nonproductive and intermittent.  Patient is on palliative care.  Patient is nonambulatory at baseline and has not been ambulatory since August.   History was obtained by patient and daughter  HPI     Home Medications Prior to Admission medications   Medication Sig Start Date End Date Taking? Authorizing Provider  acetaminophen (TYLENOL) 325 MG tablet Take 2 tablets (650 mg total) by mouth every 6 (six) hours as needed for mild pain (or Fever >/= 101). 11/18/20   Roxan Hockey, MD  albuterol (VENTOLIN HFA) 108 (90 Base) MCG/ACT inhaler Inhale 2 puffs into the lungs every 4 (four) hours as needed for wheezing or shortness of breath. 12/02/20   Gerlene Fee, NP  aspirin EC 81 MG tablet Take 1 tablet (81 mg total) by mouth in the morning and at bedtime. Swallow whole. 08/03/21 08/03/22  Barton Dubois, MD  [facility] ADULT CARE 11.3 % CREA cream Apply topically. 08/30/21   [provider]  bisacodyl (DULCOLAX) 10 MG suppository Place 1 suppository (10  mg total) rectally daily as needed for moderate constipation. 08/04/21   Barton Dubois, MD  calcium citrate-vitamin D (CITRACAL+D) 315-200 MG-UNIT per tablet Take 1 tablet by mouth daily.    [provider]  Cyanocobalamin (VITAMIN B 12 PO) Take 250 mcg by mouth daily.    [provider]  donepezil (ARICEPT) 5 MG tablet Take 1 tablet (5 mg total) by mouth at bedtime. 12/02/20   Gerlene Fee, NP  escitalopram (LEXAPRO) 10 MG tablet Take 1 tablet (10 mg total) by mouth daily. 12/02/20   Gerlene Fee, NP  ezetimibe (ZETIA) 10 MG tablet Take 1 tablet (10 mg total) by mouth daily. 12/02/20   Gerlene Fee, NP  fentaNYL (DURAGESIC) 12 MCG/HR SMARTSIG:1 Patch(s) T-DERMAL Every 3 Days 10/12/21   [provider]  ferrous sulfate 325 (65 FE) MG tablet Take 1 tablet (325 mg total) by mouth daily with breakfast. 12/02/20   Gerlene Fee, NP  furosemide (LASIX) 20 MG tablet Take 1 tablet (20 mg total) by mouth daily as needed for fluid or edema. 08/03/21   Barton Dubois, MD  haloperidol (HALDOL) 2 MG tablet Take 2 mg by mouth daily. 10/12/21   [provider]  haloperidol (HALDOL) 5 MG tablet Take 5 mg by mouth daily. 10/12/21   [provider]  Lidocaine 4 % PTCH Apply topically in the morning and at bedtime. apply to skin in AM and remove in PM    [provider]  LORazepam (ATIVAN) 0.5 MG tablet Take 0.5 mg by mouth every 3 (three) hours as needed. 10/06/21   [provider]  Multiple Vitamin (MULTIVITAMIN WITH MINERALS) TABS tablet Take 1 tablet by mouth daily. 08/05/16   Johnson, Clanford L, MD  omeprazole (PRILOSEC) 20 MG capsule Take 1 capsule (20 mg total) by mouth 2 (two) times daily before a meal. 08/03/21   Barton Dubois, MD  ondansetron (ZOFRAN) 4 MG tablet Take 4 mg by mouth every 6 (six) hours as needed. 10/06/21   [provider]  oxyCODONE (OXY IR/ROXICODONE) 5 MG immediate release tablet Take 1 tablet (5 mg total) by mouth  every 6 (six) hours as needed for severe pain. 08/03/21   Barton Dubois, MD  senna-docusate (SENOKOT-S) 8.6-50 MG tablet Take 2 tablets by mouth 2 (two) times daily. 08/03/21   Barton Dubois, MD  traZODone (DESYREL) 50 MG tablet Take 0.5 tablets (25 mg total) by mouth 2 (two) times daily. 08/03/21   Barton Dubois, MD      Allergies    Ace inhibitors, Aspirin, Codeine, Pineapple, Plasticized base [plastibase], Ultram [tramadol], Adhesive [tape], Naprosyn [naproxen], Biaxin [clarithromycin], Clindamycin/lincomycin, Linzess [linaclotide], Metronidazole, Nsaids, Penicillins, Varenicline tartrate, and Xarelto [rivaroxaban]    Review of Systems   Review of Systems  Respiratory:  Positive for cough.   All other systems reviewed and are negative.   Physical Exam Updated Vital Signs Ht 5\' 4"  (1.626 m)   Wt 54.4 kg   BMI 20.60 kg/m  Physical Exam Vitals and nursing note reviewed.  Constitutional:      General: She is not in acute distress.    Appearance: She is normal weight. She is not ill-appearing.     Comments: Cachectic  HENT:     Head: Normocephalic and atraumatic.     Mouth/Throat:     Comments: Poor dentition with significant underbite Cardiovascular:     Rate and Rhythm: Normal rate and regular rhythm.     Pulses: Normal pulses.  Pulmonary:     Effort: Pulmonary effort is normal. No respiratory distress.     Breath sounds: Normal breath sounds. No stridor. No wheezing, rhonchi or rales.  Abdominal:     General: Abdomen is flat.  Musculoskeletal:        General: Normal range of motion.     Cervical back: Neck supple.     Right lower leg: No edema.     Left lower leg: No edema.  Skin:    General: Skin is warm and dry.     Capillary Refill: Capillary refill takes less than 2 seconds.  Neurological:     General: No focal deficit present.     Mental Status: She is alert and oriented to person, place, and time. Mental status is at baseline.  Psychiatric:        Mood and  Affect: Mood normal.        Behavior: Behavior normal.     ED Results / Procedures / Treatments   Labs (all labs ordered are listed, but only abnormal results are displayed) Labs Reviewed - No data to display  EKG None  Radiology No results found.  Procedures Procedures    Medications Ordered in ED Medications - No data to display  ED Course/ Medical Decision Making/ A&P                           Medical Decision Making Amount and/or Complexity of Data Reviewed  Labs: ordered. Radiology: ordered.  Risk Prescription drug management.  This patient presents to the ED for concern of cough, this involves an extensive number of treatment options, and is a complaint that carries with it a high risk of complications and morbidity.  Differential diagnosis for emergent cause of cough includes but is not limited to upper respiratory infection, lower respiratory infection, allergies, asthma, irritants, foreign body, medications such as ACE inhibitors, reflux, asthma, CHF, lung cancer, interstitial lung disease, psychiatric causes, postnasal drip and postinfectious bronchospasm.    Co morbidities that complicate the patient evaluation   hypertension, gout, CKD, cerebrovascular disease, complete heart block, COPD, irritable bowel syndrome, GERD  My initial workup includes basic labs, chest x-ray, EKG  Additional history obtained from: Nursing notes from this visit. Family daughter is present provides a portion of the history EMS provides personal history  I ordered, reviewed and interpreted labs which include: BMP, CBC.  Slight leukocytosis of 14.2.  Stable anemia of 8.9.   I ordered imaging studies including chest x-ray I independently visualized and interpreted imaging which showed no cardiopulmonary abnormalities I agree with the radiologist interpretation  Cardiac Monitoring:  The patient was maintained on a cardiac monitor.  I personally viewed and interpreted the  cardiac monitored which showed an underlying rhythm of: Complete heart block  Afebrile, hemodynamically stable.  Patient had a persistent pulse ox between 90 and 100 on room air throughout her stay.  She is a palliative care patient with her daughter is power of attorney.  Daughter was present at the time of the evaluation.  Patient is on palliative care and shared decision making conversation was had regarding extensive work-up versus focused.  Daughter stated she just wanted to make sure the patient does not have pneumonia.  Lab work-up significant for leukocytosis of 14.2.  Also significant for elevated creatinine at 3.78.  This was discussed with patient.  They prefer to forego IV fluids and admission at this time and to treat this at the nursing home.  Chest x-ray was negative and there are no adventitious breath sounds, however with her leukocytosis patient will be started on doxycycline due to nursing home status.  She was given return precautions.  Stable at discharge.  At this time there does not appear to be any evidence of an acute emergency medical condition and the patient appears stable for discharge with appropriate outpatient follow up. Diagnosis was discussed with patient who verbalizes understanding of care plan and is agreeable to discharge. I have discussed return precautions with patient and daughter who verbalizes understanding. Patient encouraged to follow-up with their PCP within 1 week. All questions answered.  Patient's case discussed with Dr. Roderic Palau who agrees with plan to discharge with follow-up.   Note: Portions of this report may have been transcribed using voice recognition software. Every effort was made to ensure accuracy; however, inadvertent computerized transcription errors may still be present.          Final Clinical Impression(s) / ED Diagnoses Final diagnoses:  None    Rx / DC Orders ED Discharge Orders     None         Nehemiah Massed 04/16/22 1748    Milton Ferguson, MD 04/18/22 1209

## 2022-04-17 ENCOUNTER — Encounter (HOSPITAL_COMMUNITY): Payer: Self-pay | Admitting: Hematology

## 2022-04-17 DIAGNOSIS — R262 Difficulty in walking, not elsewhere classified: Secondary | ICD-10-CM | POA: Diagnosis not present

## 2022-04-17 DIAGNOSIS — I509 Heart failure, unspecified: Secondary | ICD-10-CM | POA: Diagnosis not present

## 2022-04-17 DIAGNOSIS — R2689 Other abnormalities of gait and mobility: Secondary | ICD-10-CM | POA: Diagnosis not present

## 2022-04-17 DIAGNOSIS — M6281 Muscle weakness (generalized): Secondary | ICD-10-CM | POA: Diagnosis not present

## 2022-04-18 DIAGNOSIS — M6281 Muscle weakness (generalized): Secondary | ICD-10-CM | POA: Diagnosis not present

## 2022-04-18 DIAGNOSIS — R262 Difficulty in walking, not elsewhere classified: Secondary | ICD-10-CM | POA: Diagnosis not present

## 2022-04-18 DIAGNOSIS — R2689 Other abnormalities of gait and mobility: Secondary | ICD-10-CM | POA: Diagnosis not present

## 2022-04-18 DIAGNOSIS — I509 Heart failure, unspecified: Secondary | ICD-10-CM | POA: Diagnosis not present

## 2022-04-19 DIAGNOSIS — R2689 Other abnormalities of gait and mobility: Secondary | ICD-10-CM | POA: Diagnosis not present

## 2022-04-19 DIAGNOSIS — M6281 Muscle weakness (generalized): Secondary | ICD-10-CM | POA: Diagnosis not present

## 2022-04-19 DIAGNOSIS — Z7409 Other reduced mobility: Secondary | ICD-10-CM | POA: Diagnosis not present

## 2022-04-19 DIAGNOSIS — L89312 Pressure ulcer of right buttock, stage 2: Secondary | ICD-10-CM | POA: Diagnosis not present

## 2022-04-19 DIAGNOSIS — R262 Difficulty in walking, not elsewhere classified: Secondary | ICD-10-CM | POA: Diagnosis not present

## 2022-04-19 DIAGNOSIS — I509 Heart failure, unspecified: Secondary | ICD-10-CM | POA: Diagnosis not present

## 2022-04-20 DIAGNOSIS — R262 Difficulty in walking, not elsewhere classified: Secondary | ICD-10-CM | POA: Diagnosis not present

## 2022-04-20 DIAGNOSIS — I509 Heart failure, unspecified: Secondary | ICD-10-CM | POA: Diagnosis not present

## 2022-04-20 DIAGNOSIS — R2689 Other abnormalities of gait and mobility: Secondary | ICD-10-CM | POA: Diagnosis not present

## 2022-04-20 DIAGNOSIS — M6281 Muscle weakness (generalized): Secondary | ICD-10-CM | POA: Diagnosis not present

## 2022-04-20 DIAGNOSIS — T8130XA Disruption of wound, unspecified, initial encounter: Secondary | ICD-10-CM | POA: Diagnosis not present

## 2022-04-21 DIAGNOSIS — M6281 Muscle weakness (generalized): Secondary | ICD-10-CM | POA: Diagnosis not present

## 2022-04-21 DIAGNOSIS — R2689 Other abnormalities of gait and mobility: Secondary | ICD-10-CM | POA: Diagnosis not present

## 2022-04-21 DIAGNOSIS — R262 Difficulty in walking, not elsewhere classified: Secondary | ICD-10-CM | POA: Diagnosis not present

## 2022-04-21 DIAGNOSIS — S31000A Unspecified open wound of lower back and pelvis without penetration into retroperitoneum, initial encounter: Secondary | ICD-10-CM | POA: Diagnosis not present

## 2022-04-21 DIAGNOSIS — I509 Heart failure, unspecified: Secondary | ICD-10-CM | POA: Diagnosis not present

## 2022-04-25 DIAGNOSIS — M6281 Muscle weakness (generalized): Secondary | ICD-10-CM | POA: Diagnosis not present

## 2022-04-25 DIAGNOSIS — L89323 Pressure ulcer of left buttock, stage 3: Secondary | ICD-10-CM | POA: Diagnosis not present

## 2022-04-25 DIAGNOSIS — L89313 Pressure ulcer of right buttock, stage 3: Secondary | ICD-10-CM | POA: Diagnosis not present

## 2022-04-25 DIAGNOSIS — L304 Erythema intertrigo: Secondary | ICD-10-CM | POA: Diagnosis not present

## 2022-04-28 DIAGNOSIS — S31000A Unspecified open wound of lower back and pelvis without penetration into retroperitoneum, initial encounter: Secondary | ICD-10-CM | POA: Diagnosis not present

## 2022-05-02 DIAGNOSIS — J449 Chronic obstructive pulmonary disease, unspecified: Secondary | ICD-10-CM | POA: Diagnosis not present

## 2022-05-02 DIAGNOSIS — C3411 Malignant neoplasm of upper lobe, right bronchus or lung: Secondary | ICD-10-CM | POA: Diagnosis not present

## 2022-05-03 ENCOUNTER — Emergency Department (HOSPITAL_COMMUNITY)
Admission: EM | Admit: 2022-05-03 | Discharge: 2022-05-03 | Disposition: A | Discharge status: Skilled Nursing Facility | Payer: Medicare Other | Attending: Emergency Medicine | Admitting: Emergency Medicine

## 2022-05-03 ENCOUNTER — Other Ambulatory Visit: Payer: Self-pay

## 2022-05-03 ENCOUNTER — Encounter (HOSPITAL_COMMUNITY): Payer: Self-pay

## 2022-05-03 DIAGNOSIS — R638 Other symptoms and signs concerning food and fluid intake: Secondary | ICD-10-CM | POA: Diagnosis not present

## 2022-05-03 DIAGNOSIS — Z043 Encounter for examination and observation following other accident: Secondary | ICD-10-CM | POA: Diagnosis not present

## 2022-05-03 DIAGNOSIS — M549 Dorsalgia, unspecified: Secondary | ICD-10-CM | POA: Insufficient documentation

## 2022-05-03 DIAGNOSIS — M6281 Muscle weakness (generalized): Secondary | ICD-10-CM | POA: Diagnosis not present

## 2022-05-03 DIAGNOSIS — W19XXXA Unspecified fall, initial encounter: Secondary | ICD-10-CM

## 2022-05-03 DIAGNOSIS — R0689 Other abnormalities of breathing: Secondary | ICD-10-CM | POA: Diagnosis not present

## 2022-05-03 DIAGNOSIS — Z7982 Long term (current) use of aspirin: Secondary | ICD-10-CM | POA: Diagnosis not present

## 2022-05-03 DIAGNOSIS — F0390 Unspecified dementia without behavioral disturbance: Secondary | ICD-10-CM | POA: Insufficient documentation

## 2022-05-03 DIAGNOSIS — L89313 Pressure ulcer of right buttock, stage 3: Secondary | ICD-10-CM | POA: Diagnosis not present

## 2022-05-03 DIAGNOSIS — I959 Hypotension, unspecified: Secondary | ICD-10-CM | POA: Diagnosis not present

## 2022-05-03 DIAGNOSIS — R296 Repeated falls: Secondary | ICD-10-CM | POA: Diagnosis not present

## 2022-05-03 DIAGNOSIS — L89323 Pressure ulcer of left buttock, stage 3: Secondary | ICD-10-CM | POA: Diagnosis not present

## 2022-05-03 DIAGNOSIS — L304 Erythema intertrigo: Secondary | ICD-10-CM | POA: Diagnosis not present

## 2022-05-03 NOTE — Discharge Instructions (Signed)
There is no signs of injury, no signs of trauma, please drink plenty of clear liquids and follow-up with your doctor

## 2022-05-03 NOTE — ED Provider Notes (Signed)
[facility] EMERGENCY DEPARTMENT Provider Note   CSN: [id number] Arrival date & time: 05/03/22  2034     History  Chief Complaint  Patient presents with   Charlotte Henry    Charlotte Henry is a 81 y.o. female.   Fall   This patient is an 81 year old female, history of dementia on Aricept, not anticoagulated except for baby aspirin.  Presents after she was found on the floor by her pastor.  She is currently at [place].  The patient has no complaints, states that she is hungry, has no headache no neck pain no chest pain no belly pain no arms or leg pain.  No other acute findings or complaints on exam    Home Medications Prior to Admission medications   Medication Sig Start Date End Date Taking? Authorizing Provider  acetaminophen (TYLENOL) 325 MG tablet Take 2 tablets (650 mg total) by mouth every 6 (six) hours as needed for mild pain (or Fever >/= 101). 11/18/20   Roxan Hockey, MD  albuterol (VENTOLIN HFA) 108 (90 Base) MCG/ACT inhaler Inhale 2 puffs into the lungs every 4 (four) hours as needed for wheezing or shortness of breath. 12/02/20   Gerlene Fee, NP  aspirin EC 81 MG tablet Take 1 tablet (81 mg total) by mouth in the morning and at bedtime. Swallow whole. 08/03/21 08/03/22  Barton Dubois, MD  [facility] ADULT CARE 11.3 % CREA cream Apply topically. 08/30/21   [provider]  bisacodyl (DULCOLAX) 10 MG suppository Place 1 suppository (10 mg total) rectally daily as needed for moderate constipation. 08/04/21   Barton Dubois, MD  calcium citrate-vitamin D (CITRACAL+D) 315-200 MG-UNIT per tablet Take 1 tablet by mouth daily.    [provider]  Cyanocobalamin (VITAMIN B 12 PO) Take 250 mcg by mouth daily.    [provider]  donepezil (ARICEPT) 5 MG tablet Take 1 tablet (5 mg total) by mouth at bedtime. 12/02/20   Gerlene Fee, NP  escitalopram (LEXAPRO) 10 MG tablet Take 1 tablet (10 mg total) by mouth daily. 12/02/20   Gerlene Fee,  NP  ezetimibe (ZETIA) 10 MG tablet Take 1 tablet (10 mg total) by mouth daily. 12/02/20   Gerlene Fee, NP  fentaNYL (DURAGESIC) 12 MCG/HR SMARTSIG:1 Patch(s) T-DERMAL Every 3 Days 10/12/21   [provider]  ferrous sulfate 325 (65 FE) MG tablet Take 1 tablet (325 mg total) by mouth daily with breakfast. 12/02/20   Gerlene Fee, NP  furosemide (LASIX) 20 MG tablet Take 1 tablet (20 mg total) by mouth daily as needed for fluid or edema. 08/03/21   Barton Dubois, MD  haloperidol (HALDOL) 2 MG tablet Take 2 mg by mouth daily. 10/12/21   [provider]  haloperidol (HALDOL) 5 MG tablet Take 5 mg by mouth daily. 10/12/21   [provider]  Lidocaine 4 % PTCH Apply topically in the morning and at bedtime. apply to skin in AM and remove in PM    [provider]  LORazepam (ATIVAN) 0.5 MG tablet Take 0.5 mg by mouth every 3 (three) hours as needed. 10/06/21   [provider]  Multiple Vitamin (MULTIVITAMIN WITH MINERALS) TABS tablet Take 1 tablet by mouth daily. 08/05/16   Johnson, Clanford L, MD  omeprazole (PRILOSEC) 20 MG capsule Take 1 capsule (20 mg total) by mouth 2 (two) times daily before a meal. 08/03/21   Barton Dubois, MD  ondansetron (ZOFRAN) 4 MG tablet Take 4 mg by  mouth every 6 (six) hours as needed. 10/06/21   [provider]  oxyCODONE (OXY IR/ROXICODONE) 5 MG immediate release tablet Take 1 tablet (5 mg total) by mouth every 6 (six) hours as needed for severe pain. 08/03/21   Barton Dubois, MD  senna-docusate (SENOKOT-S) 8.6-50 MG tablet Take 2 tablets by mouth 2 (two) times daily. 08/03/21   Barton Dubois, MD  traZODone (DESYREL) 50 MG tablet Take 0.5 tablets (25 mg total) by mouth 2 (two) times daily. 08/03/21   Barton Dubois, MD      Allergies    Ace inhibitors, Aspirin, Codeine, Pineapple, Plasticized base [plastibase], Ultram [tramadol], Adhesive [tape], Naprosyn [naproxen], Biaxin [clarithromycin], Clindamycin/lincomycin, Linzess  [linaclotide], Metronidazole, Nsaids, Penicillins, Varenicline tartrate, and Xarelto [rivaroxaban]    Review of Systems   Review of Systems  Unable to perform ROS: Dementia    Physical Exam Updated Vital Signs BP 96/79 (BP Location: Right Arm)   Pulse (!) 59   Temp 97.8 F (36.6 C)   Resp 16   Ht 1.626 m (5\' 4" )   Wt 54.4 kg   SpO2 100%   BMI 20.60 kg/m  Physical Exam Vitals and nursing note reviewed.  Constitutional:      General: She is not in acute distress.    Appearance: She is well-developed.  HENT:     Head: Normocephalic and atraumatic.     Comments: No signs of trauma on the head or face    Mouth/Throat:     Pharynx: No oropharyngeal exudate.  Eyes:     General: No scleral icterus.       Right eye: No discharge.        Left eye: No discharge.     Conjunctiva/sclera: Conjunctivae normal.     Pupils: Pupils are equal, round, and reactive to light.  Neck:     Thyroid: No thyromegaly.     Vascular: No JVD.  Cardiovascular:     Rate and Rhythm: Normal rate and regular rhythm.     Heart sounds: Normal heart sounds. No murmur heard.    No friction rub. No gallop.  Pulmonary:     Effort: Pulmonary effort is normal. No respiratory distress.     Breath sounds: Normal breath sounds. No wheezing or rales.  Abdominal:     General: Bowel sounds are normal. There is no distension.     Palpations: Abdomen is soft. There is no mass.     Tenderness: There is no abdominal tenderness.  Musculoskeletal:        General: No tenderness. Normal range of motion.     Cervical back: Normal range of motion and neck supple.     Right lower leg: No edema.     Left lower leg: No edema.     Comments: Full range of motion of all 4 extremities with supple joints and soft compartments diffusely.  There is no tenderness over the cervical thoracic or lumbar spines, there is some sacral decub's that are well cared for  Lymphadenopathy:     Cervical: No cervical adenopathy.  Skin:     General: Skin is warm and dry.     Findings: No erythema or rash.  Neurological:     Mental Status: She is alert.     Coordination: Coordination normal.     Comments: Pleasant and interactive, the patient does not know where she is she does not know the date she does know her name  Psychiatric:        Behavior:  Behavior normal.     ED Results / Procedures / Treatments   Labs (all labs ordered are listed, but only abnormal results are displayed) Labs Reviewed - No data to display  EKG EKG Interpretation  Date/Time:  Wednesday May 03 2022 20:48:09 EST Ventricular Rate:  67 PR Interval:    QRS Duration: 158 QT Interval:  506 QTC Calculation: 534 R Axis:   -74 Text Interpretation: AV dual-paced rhythm with frequent Premature ventricular complexes Abnormal ECG When compared with ECG of 16-Apr-2022 17:11, PREVIOUS ECG IS PRESENT since last tracing no significant change Confirmed by Noemi Chapel [phone]) on 05/03/2022 9:04:19 PM  Radiology No results found.  Procedures Procedures    Medications Ordered in ED Medications - No data to display  ED Course/ Medical Decision Making/ A&P                           Medical Decision Making  Fall, no obvious injuries, she has full range of motion of her hips and her shoulders, no visible injuries or deformity to the arms or legs.  There is no tenderness over the chest wall or the back, no signs of injury to the head.  I do not see anything that needs to be imaged, her vital signs are unremarkable including no tachycardia or fever.  Patient's EKG unremarkable, vital signs unremarkable, stable for discharge  She was observed in the emergency department an hour and a half and had no decompensation or concerns or abnormal findings        Final Clinical Impression(s) / ED Diagnoses Final diagnoses:  Fall, initial encounter    Rx / DC Orders ED Discharge Orders     None         Noemi Chapel, MD 05/03/22 2214

## 2022-05-03 NOTE — ED Triage Notes (Signed)
[facility] found for fall.  Found 7:50pm by Doristine Bosworth. Unwitnessed fall. Only complaint to EMS was back pain. Only aware of self at baseline. Pt is palliative care.

## 2022-05-04 ENCOUNTER — Telehealth: Payer: Self-pay

## 2022-05-04 NOTE — Telephone Encounter (Signed)
        Patient  visited [facility] on 11/29   Telephone encounter attempt :  1st  A HIPAA compliant voice message was left requesting a return call.  Instructed patient to call back

## 2022-05-05 DIAGNOSIS — 419620001 Death: Secondary | SNOMED CT | POA: Diagnosis not present

## 2022-05-05 DEATH — deceased

## 2022-05-08 ENCOUNTER — Telehealth: Payer: Self-pay

## 2022-05-08 NOTE — Telephone Encounter (Signed)
        Patient  visited [facility] F[facility] on 11/30    Telephone encounter attempt :  2nd   A HIPAA compliant voice message was left requesting a return call.  Instructed patient to call back   [place]  [phone], [place], [place] [address] Phone: [phone] Email: [email or web address]

## 2022-05-10 DIAGNOSIS — M6281 Muscle weakness (generalized): Secondary | ICD-10-CM | POA: Diagnosis not present

## 2022-05-10 DIAGNOSIS — L89323 Pressure ulcer of left buttock, stage 3: Secondary | ICD-10-CM | POA: Diagnosis not present

## 2022-05-10 DIAGNOSIS — M79675 Pain in left toe(s): Secondary | ICD-10-CM | POA: Diagnosis not present

## 2022-05-10 DIAGNOSIS — L89313 Pressure ulcer of right buttock, stage 3: Secondary | ICD-10-CM | POA: Diagnosis not present

## 2022-05-10 DIAGNOSIS — M79674 Pain in right toe(s): Secondary | ICD-10-CM | POA: Diagnosis not present

## 2022-05-10 DIAGNOSIS — B351 Tinea unguium: Secondary | ICD-10-CM | POA: Diagnosis not present

## 2022-05-10 DIAGNOSIS — L304 Erythema intertrigo: Secondary | ICD-10-CM | POA: Diagnosis not present

## 2022-05-16 DIAGNOSIS — L89323 Pressure ulcer of left buttock, stage 3: Secondary | ICD-10-CM | POA: Diagnosis not present

## 2022-05-16 DIAGNOSIS — L89313 Pressure ulcer of right buttock, stage 3: Secondary | ICD-10-CM | POA: Diagnosis not present

## 2022-05-17 DIAGNOSIS — L304 Erythema intertrigo: Secondary | ICD-10-CM | POA: Diagnosis not present

## 2022-05-17 DIAGNOSIS — G894 Chronic pain syndrome: Secondary | ICD-10-CM | POA: Diagnosis not present

## 2022-05-17 DIAGNOSIS — L89313 Pressure ulcer of right buttock, stage 3: Secondary | ICD-10-CM | POA: Diagnosis not present

## 2022-05-17 DIAGNOSIS — M6281 Muscle weakness (generalized): Secondary | ICD-10-CM | POA: Diagnosis not present

## 2022-05-17 DIAGNOSIS — L89323 Pressure ulcer of left buttock, stage 3: Secondary | ICD-10-CM | POA: Diagnosis not present

## 2022-05-22 DIAGNOSIS — R296 Repeated falls: Secondary | ICD-10-CM | POA: Diagnosis not present

## 2022-05-22 DIAGNOSIS — M25562 Pain in left knee: Secondary | ICD-10-CM | POA: Diagnosis not present

## 2022-05-22 DIAGNOSIS — M25561 Pain in right knee: Secondary | ICD-10-CM | POA: Diagnosis not present

## 2022-05-24 DIAGNOSIS — M25561 Pain in right knee: Secondary | ICD-10-CM | POA: Diagnosis not present

## 2022-05-24 DIAGNOSIS — R296 Repeated falls: Secondary | ICD-10-CM | POA: Diagnosis not present

## 2022-05-24 DIAGNOSIS — M25562 Pain in left knee: Secondary | ICD-10-CM | POA: Diagnosis not present

## 2022-05-25 DIAGNOSIS — R634 Abnormal weight loss: Secondary | ICD-10-CM | POA: Diagnosis not present

## 2022-05-25 DIAGNOSIS — N189 Chronic kidney disease, unspecified: Secondary | ICD-10-CM | POA: Diagnosis not present

## 2022-05-25 DIAGNOSIS — E785 Hyperlipidemia, unspecified: Secondary | ICD-10-CM | POA: Diagnosis not present

## 2022-05-25 DIAGNOSIS — G894 Chronic pain syndrome: Secondary | ICD-10-CM | POA: Diagnosis not present

## 2022-05-25 DIAGNOSIS — R531 Weakness: Secondary | ICD-10-CM | POA: Diagnosis not present

## 2022-05-25 DIAGNOSIS — R296 Repeated falls: Secondary | ICD-10-CM | POA: Diagnosis not present

## 2022-05-25 DIAGNOSIS — I509 Heart failure, unspecified: Secondary | ICD-10-CM | POA: Diagnosis not present

## 2022-05-26 DIAGNOSIS — L89323 Pressure ulcer of left buttock, stage 3: Secondary | ICD-10-CM | POA: Diagnosis not present

## 2022-05-26 DIAGNOSIS — M6281 Muscle weakness (generalized): Secondary | ICD-10-CM | POA: Diagnosis not present

## 2022-05-26 DIAGNOSIS — L304 Erythema intertrigo: Secondary | ICD-10-CM | POA: Diagnosis not present

## 2022-05-26 DIAGNOSIS — L89313 Pressure ulcer of right buttock, stage 3: Secondary | ICD-10-CM | POA: Diagnosis not present

## 2022-05-31 DIAGNOSIS — L89323 Pressure ulcer of left buttock, stage 3: Secondary | ICD-10-CM | POA: Diagnosis not present

## 2022-05-31 DIAGNOSIS — L89313 Pressure ulcer of right buttock, stage 3: Secondary | ICD-10-CM | POA: Diagnosis not present

## 2022-05-31 DIAGNOSIS — M6281 Muscle weakness (generalized): Secondary | ICD-10-CM | POA: Diagnosis not present

## 2022-05-31 DIAGNOSIS — L304 Erythema intertrigo: Secondary | ICD-10-CM | POA: Diagnosis not present

## 2022-06-07 DIAGNOSIS — L304 Erythema intertrigo: Secondary | ICD-10-CM | POA: Diagnosis not present

## 2022-06-07 DIAGNOSIS — L89323 Pressure ulcer of left buttock, stage 3: Secondary | ICD-10-CM | POA: Diagnosis not present

## 2022-06-07 DIAGNOSIS — R634 Abnormal weight loss: Secondary | ICD-10-CM | POA: Diagnosis not present

## 2022-06-07 DIAGNOSIS — S31000A Unspecified open wound of lower back and pelvis without penetration into retroperitoneum, initial encounter: Secondary | ICD-10-CM | POA: Diagnosis not present

## 2022-06-07 DIAGNOSIS — L89313 Pressure ulcer of right buttock, stage 3: Secondary | ICD-10-CM | POA: Diagnosis not present

## 2022-06-07 DIAGNOSIS — Z7409 Other reduced mobility: Secondary | ICD-10-CM | POA: Diagnosis not present

## 2022-06-07 DIAGNOSIS — M6281 Muscle weakness (generalized): Secondary | ICD-10-CM | POA: Diagnosis not present

## 2022-06-12 ENCOUNTER — Encounter (HOSPITAL_COMMUNITY): Payer: Self-pay | Admitting: Hematology

## 2022-06-13 ENCOUNTER — Encounter (HOSPITAL_COMMUNITY): Payer: Self-pay | Admitting: Hematology

## 2022-06-13 DIAGNOSIS — L89323 Pressure ulcer of left buttock, stage 3: Secondary | ICD-10-CM | POA: Diagnosis not present

## 2022-06-19 DIAGNOSIS — G894 Chronic pain syndrome: Secondary | ICD-10-CM | POA: Diagnosis not present

## 2022-06-21 DIAGNOSIS — L89313 Pressure ulcer of right buttock, stage 3: Secondary | ICD-10-CM | POA: Diagnosis not present

## 2022-06-21 DIAGNOSIS — L304 Erythema intertrigo: Secondary | ICD-10-CM | POA: Diagnosis not present

## 2022-06-21 DIAGNOSIS — L89323 Pressure ulcer of left buttock, stage 3: Secondary | ICD-10-CM | POA: Diagnosis not present

## 2022-06-21 DIAGNOSIS — Z7409 Other reduced mobility: Secondary | ICD-10-CM | POA: Diagnosis not present

## 2022-06-21 DIAGNOSIS — M6281 Muscle weakness (generalized): Secondary | ICD-10-CM | POA: Diagnosis not present

## 2022-06-27 DIAGNOSIS — L89303 Pressure ulcer of unspecified buttock, stage 3: Secondary | ICD-10-CM | POA: Diagnosis not present

## 2022-06-30 DIAGNOSIS — L89313 Pressure ulcer of right buttock, stage 3: Secondary | ICD-10-CM | POA: Diagnosis not present

## 2022-06-30 DIAGNOSIS — Z7409 Other reduced mobility: Secondary | ICD-10-CM | POA: Diagnosis not present

## 2022-06-30 DIAGNOSIS — L89323 Pressure ulcer of left buttock, stage 3: Secondary | ICD-10-CM | POA: Diagnosis not present

## 2022-06-30 DIAGNOSIS — M6281 Muscle weakness (generalized): Secondary | ICD-10-CM | POA: Diagnosis not present

## 2022-06-30 DIAGNOSIS — L304 Erythema intertrigo: Secondary | ICD-10-CM | POA: Diagnosis not present

## 2022-07-05 DIAGNOSIS — L89323 Pressure ulcer of left buttock, stage 3: Secondary | ICD-10-CM | POA: Diagnosis not present

## 2022-07-05 DIAGNOSIS — M6281 Muscle weakness (generalized): Secondary | ICD-10-CM | POA: Diagnosis not present

## 2022-07-05 DIAGNOSIS — L304 Erythema intertrigo: Secondary | ICD-10-CM | POA: Diagnosis not present

## 2022-07-05 DIAGNOSIS — L89313 Pressure ulcer of right buttock, stage 3: Secondary | ICD-10-CM | POA: Diagnosis not present

## 2022-07-05 DIAGNOSIS — Z7409 Other reduced mobility: Secondary | ICD-10-CM | POA: Diagnosis not present

## 2022-07-11 DIAGNOSIS — L89323 Pressure ulcer of left buttock, stage 3: Secondary | ICD-10-CM | POA: Diagnosis not present

## 2022-07-12 DIAGNOSIS — Z7409 Other reduced mobility: Secondary | ICD-10-CM | POA: Diagnosis not present

## 2022-07-12 DIAGNOSIS — L89323 Pressure ulcer of left buttock, stage 3: Secondary | ICD-10-CM | POA: Diagnosis not present

## 2022-07-12 DIAGNOSIS — L304 Erythema intertrigo: Secondary | ICD-10-CM | POA: Diagnosis not present

## 2022-07-12 DIAGNOSIS — M6281 Muscle weakness (generalized): Secondary | ICD-10-CM | POA: Diagnosis not present

## 2022-07-13 ENCOUNTER — Encounter (HOSPITAL_COMMUNITY): Payer: Self-pay | Admitting: Hematology

## 2022-07-13 DIAGNOSIS — C3411 Malignant neoplasm of upper lobe, right bronchus or lung: Secondary | ICD-10-CM | POA: Diagnosis not present

## 2022-07-13 DIAGNOSIS — J449 Chronic obstructive pulmonary disease, unspecified: Secondary | ICD-10-CM | POA: Diagnosis not present

## 2022-07-20 DIAGNOSIS — M6281 Muscle weakness (generalized): Secondary | ICD-10-CM | POA: Diagnosis not present

## 2022-07-20 DIAGNOSIS — Z7409 Other reduced mobility: Secondary | ICD-10-CM | POA: Diagnosis not present

## 2022-07-20 DIAGNOSIS — L89323 Pressure ulcer of left buttock, stage 3: Secondary | ICD-10-CM | POA: Diagnosis not present

## 2022-07-20 DIAGNOSIS — L304 Erythema intertrigo: Secondary | ICD-10-CM | POA: Diagnosis not present

## 2022-07-21 ENCOUNTER — Encounter (HOSPITAL_COMMUNITY): Payer: Self-pay | Admitting: Hematology

## 2022-07-24 DIAGNOSIS — R296 Repeated falls: Secondary | ICD-10-CM | POA: Diagnosis not present

## 2022-07-26 DIAGNOSIS — R2681 Unsteadiness on feet: Secondary | ICD-10-CM | POA: Diagnosis not present

## 2022-07-26 DIAGNOSIS — M6281 Muscle weakness (generalized): Secondary | ICD-10-CM | POA: Diagnosis not present

## 2022-07-27 ENCOUNTER — Encounter (HOSPITAL_COMMUNITY): Payer: Self-pay | Admitting: Hematology

## 2022-07-27 DIAGNOSIS — D649 Anemia, unspecified: Secondary | ICD-10-CM | POA: Diagnosis not present

## 2022-07-27 DIAGNOSIS — K08409 Partial loss of teeth, unspecified cause, unspecified class: Secondary | ICD-10-CM | POA: Diagnosis not present

## 2022-07-27 DIAGNOSIS — E785 Hyperlipidemia, unspecified: Secondary | ICD-10-CM | POA: Diagnosis not present

## 2022-07-27 DIAGNOSIS — G8929 Other chronic pain: Secondary | ICD-10-CM | POA: Diagnosis not present

## 2022-07-27 DIAGNOSIS — F32A Depression, unspecified: Secondary | ICD-10-CM | POA: Diagnosis not present

## 2022-07-27 DIAGNOSIS — I509 Heart failure, unspecified: Secondary | ICD-10-CM | POA: Diagnosis not present

## 2022-07-28 DIAGNOSIS — M6281 Muscle weakness (generalized): Secondary | ICD-10-CM | POA: Diagnosis not present

## 2022-07-28 DIAGNOSIS — R2681 Unsteadiness on feet: Secondary | ICD-10-CM | POA: Diagnosis not present

## 2022-07-28 DIAGNOSIS — Z7409 Other reduced mobility: Secondary | ICD-10-CM | POA: Diagnosis not present

## 2022-07-28 DIAGNOSIS — L89323 Pressure ulcer of left buttock, stage 3: Secondary | ICD-10-CM | POA: Diagnosis not present

## 2022-07-28 DIAGNOSIS — L304 Erythema intertrigo: Secondary | ICD-10-CM | POA: Diagnosis not present

## 2022-07-31 DIAGNOSIS — M6281 Muscle weakness (generalized): Secondary | ICD-10-CM | POA: Diagnosis not present

## 2022-07-31 DIAGNOSIS — R2681 Unsteadiness on feet: Secondary | ICD-10-CM | POA: Diagnosis not present

## 2022-07-31 DIAGNOSIS — G894 Chronic pain syndrome: Secondary | ICD-10-CM | POA: Diagnosis not present

## 2022-08-01 ENCOUNTER — Encounter (HOSPITAL_COMMUNITY): Payer: Self-pay | Admitting: Hematology

## 2022-08-01 DIAGNOSIS — R2681 Unsteadiness on feet: Secondary | ICD-10-CM | POA: Diagnosis not present

## 2022-08-01 DIAGNOSIS — M6281 Muscle weakness (generalized): Secondary | ICD-10-CM | POA: Diagnosis not present

## 2022-08-03 DIAGNOSIS — M6281 Muscle weakness (generalized): Secondary | ICD-10-CM | POA: Diagnosis not present

## 2022-08-03 DIAGNOSIS — R2681 Unsteadiness on feet: Secondary | ICD-10-CM | POA: Diagnosis not present

## 2022-08-04 DIAGNOSIS — 419620001 Death: Secondary | SNOMED CT | POA: Diagnosis not present

## 2022-08-04 DIAGNOSIS — I509 Heart failure, unspecified: Secondary | ICD-10-CM | POA: Diagnosis not present

## 2022-08-04 DIAGNOSIS — R2681 Unsteadiness on feet: Secondary | ICD-10-CM | POA: Diagnosis not present

## 2022-08-04 DIAGNOSIS — M6281 Muscle weakness (generalized): Secondary | ICD-10-CM | POA: Diagnosis not present

## 2022-08-04 DEATH — deceased

## 2022-08-07 DIAGNOSIS — M6281 Muscle weakness (generalized): Secondary | ICD-10-CM | POA: Diagnosis not present

## 2022-08-07 DIAGNOSIS — R2681 Unsteadiness on feet: Secondary | ICD-10-CM | POA: Diagnosis not present

## 2022-08-07 DIAGNOSIS — I509 Heart failure, unspecified: Secondary | ICD-10-CM | POA: Diagnosis not present

## 2022-08-08 DIAGNOSIS — I509 Heart failure, unspecified: Secondary | ICD-10-CM | POA: Diagnosis not present

## 2022-08-08 DIAGNOSIS — M6281 Muscle weakness (generalized): Secondary | ICD-10-CM | POA: Diagnosis not present

## 2022-08-08 DIAGNOSIS — R2681 Unsteadiness on feet: Secondary | ICD-10-CM | POA: Diagnosis not present

## 2022-08-09 DIAGNOSIS — R2681 Unsteadiness on feet: Secondary | ICD-10-CM | POA: Diagnosis not present

## 2022-08-09 DIAGNOSIS — I509 Heart failure, unspecified: Secondary | ICD-10-CM | POA: Diagnosis not present

## 2022-08-09 DIAGNOSIS — M6281 Muscle weakness (generalized): Secondary | ICD-10-CM | POA: Diagnosis not present

## 2022-08-10 DIAGNOSIS — I509 Heart failure, unspecified: Secondary | ICD-10-CM | POA: Diagnosis not present

## 2022-08-10 DIAGNOSIS — M6281 Muscle weakness (generalized): Secondary | ICD-10-CM | POA: Diagnosis not present

## 2022-08-10 DIAGNOSIS — R2681 Unsteadiness on feet: Secondary | ICD-10-CM | POA: Diagnosis not present

## 2022-08-11 DIAGNOSIS — R509 Fever, unspecified: Secondary | ICD-10-CM | POA: Diagnosis not present

## 2022-08-11 DIAGNOSIS — I509 Heart failure, unspecified: Secondary | ICD-10-CM | POA: Diagnosis not present

## 2022-08-11 DIAGNOSIS — M6281 Muscle weakness (generalized): Secondary | ICD-10-CM | POA: Diagnosis not present

## 2022-08-11 DIAGNOSIS — R2681 Unsteadiness on feet: Secondary | ICD-10-CM | POA: Diagnosis not present

## 2022-08-12 DIAGNOSIS — N1832 Chronic kidney disease, stage 3b: Secondary | ICD-10-CM | POA: Diagnosis not present

## 2022-08-12 DIAGNOSIS — N390 Urinary tract infection, site not specified: Secondary | ICD-10-CM | POA: Diagnosis not present

## 2022-08-12 DIAGNOSIS — R509 Fever, unspecified: Secondary | ICD-10-CM | POA: Diagnosis not present

## 2022-08-14 DIAGNOSIS — N1832 Chronic kidney disease, stage 3b: Secondary | ICD-10-CM | POA: Diagnosis not present

## 2022-08-14 DIAGNOSIS — I509 Heart failure, unspecified: Secondary | ICD-10-CM | POA: Diagnosis not present

## 2022-08-15 DIAGNOSIS — I509 Heart failure, unspecified: Secondary | ICD-10-CM | POA: Diagnosis not present

## 2022-08-15 DIAGNOSIS — R2681 Unsteadiness on feet: Secondary | ICD-10-CM | POA: Diagnosis not present

## 2022-08-15 DIAGNOSIS — M6281 Muscle weakness (generalized): Secondary | ICD-10-CM | POA: Diagnosis not present

## 2022-08-17 DIAGNOSIS — R2681 Unsteadiness on feet: Secondary | ICD-10-CM | POA: Diagnosis not present

## 2022-08-17 DIAGNOSIS — I509 Heart failure, unspecified: Secondary | ICD-10-CM | POA: Diagnosis not present

## 2022-08-17 DIAGNOSIS — M6281 Muscle weakness (generalized): Secondary | ICD-10-CM | POA: Diagnosis not present

## 2022-08-18 DIAGNOSIS — R2681 Unsteadiness on feet: Secondary | ICD-10-CM | POA: Diagnosis not present

## 2022-08-18 DIAGNOSIS — M25522 Pain in left elbow: Secondary | ICD-10-CM | POA: Diagnosis not present

## 2022-08-18 DIAGNOSIS — M25562 Pain in left knee: Secondary | ICD-10-CM | POA: Diagnosis not present

## 2022-08-18 DIAGNOSIS — I509 Heart failure, unspecified: Secondary | ICD-10-CM | POA: Diagnosis not present

## 2022-08-18 DIAGNOSIS — M6281 Muscle weakness (generalized): Secondary | ICD-10-CM | POA: Diagnosis not present

## 2022-08-21 DIAGNOSIS — R2681 Unsteadiness on feet: Secondary | ICD-10-CM | POA: Diagnosis not present

## 2022-08-21 DIAGNOSIS — M6281 Muscle weakness (generalized): Secondary | ICD-10-CM | POA: Diagnosis not present

## 2022-08-21 DIAGNOSIS — I509 Heart failure, unspecified: Secondary | ICD-10-CM | POA: Diagnosis not present

## 2022-08-25 DIAGNOSIS — M6281 Muscle weakness (generalized): Secondary | ICD-10-CM | POA: Diagnosis not present

## 2022-08-25 DIAGNOSIS — R2681 Unsteadiness on feet: Secondary | ICD-10-CM | POA: Diagnosis not present

## 2022-08-25 DIAGNOSIS — I509 Heart failure, unspecified: Secondary | ICD-10-CM | POA: Diagnosis not present

## 2022-08-25 DIAGNOSIS — J449 Chronic obstructive pulmonary disease, unspecified: Secondary | ICD-10-CM | POA: Diagnosis not present

## 2022-08-25 DIAGNOSIS — C3411 Malignant neoplasm of upper lobe, right bronchus or lung: Secondary | ICD-10-CM | POA: Diagnosis not present

## 2022-08-29 DIAGNOSIS — M6281 Muscle weakness (generalized): Secondary | ICD-10-CM | POA: Diagnosis not present

## 2022-08-29 DIAGNOSIS — R2681 Unsteadiness on feet: Secondary | ICD-10-CM | POA: Diagnosis not present

## 2022-08-29 DIAGNOSIS — I509 Heart failure, unspecified: Secondary | ICD-10-CM | POA: Diagnosis not present

## 2022-08-30 DIAGNOSIS — I509 Heart failure, unspecified: Secondary | ICD-10-CM | POA: Diagnosis not present

## 2022-08-30 DIAGNOSIS — M6281 Muscle weakness (generalized): Secondary | ICD-10-CM | POA: Diagnosis not present

## 2022-08-30 DIAGNOSIS — R2681 Unsteadiness on feet: Secondary | ICD-10-CM | POA: Diagnosis not present

## 2022-09-01 DIAGNOSIS — I509 Heart failure, unspecified: Secondary | ICD-10-CM | POA: Diagnosis not present

## 2022-09-01 DIAGNOSIS — R2681 Unsteadiness on feet: Secondary | ICD-10-CM | POA: Diagnosis not present

## 2022-09-01 DIAGNOSIS — M6281 Muscle weakness (generalized): Secondary | ICD-10-CM | POA: Diagnosis not present

## 2022-09-04 DIAGNOSIS — M6281 Muscle weakness (generalized): Secondary | ICD-10-CM | POA: Diagnosis not present

## 2022-09-04 DIAGNOSIS — 419620001 Death: Secondary | SNOMED CT | POA: Diagnosis not present

## 2022-09-04 DIAGNOSIS — R2681 Unsteadiness on feet: Secondary | ICD-10-CM | POA: Diagnosis not present

## 2022-09-04 DEATH — deceased

## 2022-09-05 DIAGNOSIS — R2681 Unsteadiness on feet: Secondary | ICD-10-CM | POA: Diagnosis not present

## 2022-09-05 DIAGNOSIS — M6281 Muscle weakness (generalized): Secondary | ICD-10-CM | POA: Diagnosis not present

## 2022-09-06 DIAGNOSIS — R2681 Unsteadiness on feet: Secondary | ICD-10-CM | POA: Diagnosis not present

## 2022-09-06 DIAGNOSIS — M6281 Muscle weakness (generalized): Secondary | ICD-10-CM | POA: Diagnosis not present

## 2022-09-07 DIAGNOSIS — R2681 Unsteadiness on feet: Secondary | ICD-10-CM | POA: Diagnosis not present

## 2022-09-07 DIAGNOSIS — M6281 Muscle weakness (generalized): Secondary | ICD-10-CM | POA: Diagnosis not present

## 2022-09-08 DIAGNOSIS — R2681 Unsteadiness on feet: Secondary | ICD-10-CM | POA: Diagnosis not present

## 2022-09-08 DIAGNOSIS — M6281 Muscle weakness (generalized): Secondary | ICD-10-CM | POA: Diagnosis not present

## 2022-09-11 DIAGNOSIS — M6281 Muscle weakness (generalized): Secondary | ICD-10-CM | POA: Diagnosis not present

## 2022-09-11 DIAGNOSIS — R2681 Unsteadiness on feet: Secondary | ICD-10-CM | POA: Diagnosis not present

## 2022-09-12 DIAGNOSIS — K5900 Constipation, unspecified: Secondary | ICD-10-CM | POA: Diagnosis not present

## 2022-09-12 DIAGNOSIS — G894 Chronic pain syndrome: Secondary | ICD-10-CM | POA: Diagnosis not present

## 2022-09-13 DIAGNOSIS — M6281 Muscle weakness (generalized): Secondary | ICD-10-CM | POA: Diagnosis not present

## 2022-09-13 DIAGNOSIS — R2681 Unsteadiness on feet: Secondary | ICD-10-CM | POA: Diagnosis not present

## 2022-09-20 DIAGNOSIS — M6281 Muscle weakness (generalized): Secondary | ICD-10-CM | POA: Diagnosis not present

## 2022-09-20 DIAGNOSIS — R2681 Unsteadiness on feet: Secondary | ICD-10-CM | POA: Diagnosis not present

## 2022-09-27 DIAGNOSIS — K5900 Constipation, unspecified: Secondary | ICD-10-CM | POA: Diagnosis not present

## 2022-09-27 DIAGNOSIS — F32A Depression, unspecified: Secondary | ICD-10-CM | POA: Diagnosis not present

## 2022-09-27 DIAGNOSIS — I509 Heart failure, unspecified: Secondary | ICD-10-CM | POA: Diagnosis not present

## 2022-09-27 DIAGNOSIS — E785 Hyperlipidemia, unspecified: Secondary | ICD-10-CM | POA: Diagnosis not present

## 2022-09-27 DIAGNOSIS — G894 Chronic pain syndrome: Secondary | ICD-10-CM | POA: Diagnosis not present

## 2022-10-08 ENCOUNTER — Encounter (HOSPITAL_COMMUNITY): Payer: Self-pay | Admitting: Hematology

## 2022-10-10 DIAGNOSIS — K5900 Constipation, unspecified: Secondary | ICD-10-CM | POA: Diagnosis not present

## 2022-10-10 DIAGNOSIS — Z760 Encounter for issue of repeat prescription: Secondary | ICD-10-CM | POA: Diagnosis not present

## 2022-10-10 DIAGNOSIS — G894 Chronic pain syndrome: Secondary | ICD-10-CM | POA: Diagnosis not present

## 2022-10-10 DIAGNOSIS — Z79899 Other long term (current) drug therapy: Secondary | ICD-10-CM | POA: Diagnosis not present

## 2022-10-10 DIAGNOSIS — E569 Vitamin deficiency, unspecified: Secondary | ICD-10-CM | POA: Diagnosis not present

## 2022-10-11 DIAGNOSIS — M79675 Pain in left toe(s): Secondary | ICD-10-CM | POA: Diagnosis not present

## 2022-10-11 DIAGNOSIS — M79674 Pain in right toe(s): Secondary | ICD-10-CM | POA: Diagnosis not present

## 2022-10-11 DIAGNOSIS — B351 Tinea unguium: Secondary | ICD-10-CM | POA: Diagnosis not present

## 2022-10-23 DIAGNOSIS — D509 Iron deficiency anemia, unspecified: Secondary | ICD-10-CM | POA: Diagnosis not present

## 2022-10-23 DIAGNOSIS — E782 Mixed hyperlipidemia: Secondary | ICD-10-CM | POA: Diagnosis not present

## 2022-10-27 DIAGNOSIS — J449 Chronic obstructive pulmonary disease, unspecified: Secondary | ICD-10-CM | POA: Diagnosis not present

## 2022-10-27 DIAGNOSIS — C3411 Malignant neoplasm of upper lobe, right bronchus or lung: Secondary | ICD-10-CM | POA: Diagnosis not present

## 2022-10-31 DIAGNOSIS — J449 Chronic obstructive pulmonary disease, unspecified: Secondary | ICD-10-CM | POA: Diagnosis not present

## 2022-10-31 DIAGNOSIS — I10 Essential (primary) hypertension: Secondary | ICD-10-CM | POA: Diagnosis not present

## 2022-10-31 DIAGNOSIS — F32A Depression, unspecified: Secondary | ICD-10-CM | POA: Diagnosis not present

## 2022-10-31 DIAGNOSIS — R269 Unspecified abnormalities of gait and mobility: Secondary | ICD-10-CM | POA: Diagnosis not present

## 2022-10-31 DIAGNOSIS — E569 Vitamin deficiency, unspecified: Secondary | ICD-10-CM | POA: Diagnosis not present

## 2022-11-01 DIAGNOSIS — J449 Chronic obstructive pulmonary disease, unspecified: Secondary | ICD-10-CM | POA: Diagnosis not present

## 2022-11-01 DIAGNOSIS — R269 Unspecified abnormalities of gait and mobility: Secondary | ICD-10-CM | POA: Diagnosis not present

## 2022-11-01 DIAGNOSIS — N390 Urinary tract infection, site not specified: Secondary | ICD-10-CM | POA: Diagnosis not present

## 2022-11-02 DIAGNOSIS — J449 Chronic obstructive pulmonary disease, unspecified: Secondary | ICD-10-CM | POA: Diagnosis not present

## 2022-11-02 DIAGNOSIS — R269 Unspecified abnormalities of gait and mobility: Secondary | ICD-10-CM | POA: Diagnosis not present

## 2022-11-03 DIAGNOSIS — J449 Chronic obstructive pulmonary disease, unspecified: Secondary | ICD-10-CM | POA: Diagnosis not present

## 2022-11-03 DIAGNOSIS — R269 Unspecified abnormalities of gait and mobility: Secondary | ICD-10-CM | POA: Diagnosis not present

## 2022-11-04 DIAGNOSIS — 419620001 Death: Secondary | SNOMED CT | POA: Diagnosis not present

## 2022-11-04 DEATH — deceased

## 2023-01-07 ENCOUNTER — Encounter (HOSPITAL_COMMUNITY): Payer: Self-pay | Admitting: Hematology

## 2023-06-29 ENCOUNTER — Emergency Department (HOSPITAL_COMMUNITY)
Admission: EM | Admit: 2023-06-29 | Discharge: 2023-06-29 | Disposition: A | Discharge status: Skilled Nursing Facility | Payer: Medicare (Managed Care) | Attending: Emergency Medicine | Admitting: Emergency Medicine

## 2023-06-29 ENCOUNTER — Encounter (HOSPITAL_COMMUNITY): Payer: Self-pay | Admitting: Hematology

## 2023-06-29 ENCOUNTER — Encounter (HOSPITAL_COMMUNITY): Payer: Self-pay | Admitting: Emergency Medicine

## 2023-06-29 ENCOUNTER — Other Ambulatory Visit: Payer: Self-pay

## 2023-06-29 ENCOUNTER — Emergency Department (HOSPITAL_COMMUNITY): Payer: Medicare (Managed Care)

## 2023-06-29 DIAGNOSIS — F0390 Unspecified dementia without behavioral disturbance: Secondary | ICD-10-CM | POA: Insufficient documentation

## 2023-06-29 DIAGNOSIS — I129 Hypertensive chronic kidney disease with stage 1 through stage 4 chronic kidney disease, or unspecified chronic kidney disease: Secondary | ICD-10-CM | POA: Insufficient documentation

## 2023-06-29 DIAGNOSIS — S0003XA Contusion of scalp, initial encounter: Secondary | ICD-10-CM | POA: Insufficient documentation

## 2023-06-29 DIAGNOSIS — S0990XA Unspecified injury of head, initial encounter: Secondary | ICD-10-CM | POA: Diagnosis present

## 2023-06-29 DIAGNOSIS — N189 Chronic kidney disease, unspecified: Secondary | ICD-10-CM | POA: Diagnosis not present

## 2023-06-29 DIAGNOSIS — W19XXXA Unspecified fall, initial encounter: Secondary | ICD-10-CM | POA: Insufficient documentation

## 2023-06-29 DIAGNOSIS — Z950 Presence of cardiac pacemaker: Secondary | ICD-10-CM | POA: Diagnosis not present

## 2023-06-29 DIAGNOSIS — J449 Chronic obstructive pulmonary disease, unspecified: Secondary | ICD-10-CM | POA: Diagnosis not present

## 2023-06-29 LAB — COMPREHENSIVE METABOLIC PANEL
ALT: 15 U/L (ref 0–44)
AST: 21 U/L (ref 15–41)
Albumin: 3.7 g/dL (ref 3.5–5.0)
Alkaline Phosphatase: 55 U/L (ref 38–126)
Anion gap: 9 (ref 5–15)
BUN: 43 mg/dL — ABNORMAL HIGH (ref 8–23)
CO2: 23 mmol/L (ref 22–32)
Calcium: 9.2 mg/dL (ref 8.9–10.3)
Chloride: 106 mmol/L (ref 98–111)
Creatinine, Ser: 2.01 mg/dL — ABNORMAL HIGH (ref 0.44–1.00)
GFR, Estimated: 24 mL/min — ABNORMAL LOW (ref >60)
Glucose, Bld: 111 mg/dL — ABNORMAL HIGH (ref 70–99)
Potassium: 4.8 mmol/L (ref 3.5–5.1)
Sodium: 138 mmol/L (ref 135–145)
Total Bilirubin: 0.6 mg/dL (ref 0.0–1.2)
Total Protein: 7.3 g/dL (ref 6.5–8.1)

## 2023-06-29 LAB — CBC WITH DIFFERENTIAL/PLATELET
Abs Immature Granulocytes: 0.05 10*3/uL (ref 0.00–0.07)
Basophils Absolute: 0 10*3/uL (ref 0.0–0.1)
Basophils Relative: 0 %
Eosinophils Absolute: 0.2 10*3/uL (ref 0.0–0.5)
Eosinophils Relative: 2 %
HCT: 28.2 % — ABNORMAL LOW (ref 36.0–46.0)
Hemoglobin: 9.3 g/dL — ABNORMAL LOW (ref 12.0–15.0)
Immature Granulocytes: 1 %
Lymphocytes Relative: 26 %
Lymphs Abs: 2.5 10*3/uL (ref 0.7–4.0)
MCH: 30.9 pg (ref 26.0–34.0)
MCHC: 33 g/dL (ref 30.0–36.0)
MCV: 93.7 fL (ref 80.0–100.0)
Monocytes Absolute: 1.2 10*3/uL — ABNORMAL HIGH (ref 0.1–1.0)
Monocytes Relative: 12 %
Neutro Abs: 5.9 10*3/uL (ref 1.7–7.7)
Neutrophils Relative %: 59 %
Platelets: 255 10*3/uL (ref 150–400)
RBC: 3.01 MIL/uL — ABNORMAL LOW (ref 3.87–5.11)
RDW: 12.7 % (ref 11.5–15.5)
WBC: 9.9 10*3/uL (ref 4.0–10.5)
nRBC: 0 % (ref 0.0–0.2)

## 2023-06-29 NOTE — ED Notes (Signed)
Attempted report to [facility].

## 2023-06-29 NOTE — Discharge Instructions (Addendum)
Her workup today was reassuring.  She has a hematoma to her scalp that it is result of her fall today.  She may have a mild concussion which can cause some confusion, lethargy and headache.  It is important that she follow-up with her primary care provider for recheck or with the provider at the facility for recheck.  Recommend that she take Tylenol every 4 hours if needed for headache.  Ice packs on and off to the swollen area.  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  This can be followed by primary care or I have listed the name of a cardiothoracic provider that she may follow-up with regarding this as well.  Return to the emergency department for any new or worsening symptoms.

## 2023-06-29 NOTE — ED Notes (Signed)
Pt refused ice pack for her knot to the back of her head. No vomiting verbalized.

## 2023-06-29 NOTE — ED Triage Notes (Signed)
Pt brought in by EMS. EMS reports that patient had at ground level fall today at 11:00 and family wanted her checked out.  + head strike, denies thinners.  Patient is unsure if she lost consciousness and is confused on the incident. Patient is A0x4 and endorses pain to the left back side of head and endorses a knot there.

## 2023-06-29 NOTE — ED Notes (Signed)
Family taking pt home and has all documents including her DNR.

## 2023-06-29 NOTE — ED Notes (Signed)
Palpated all over body/bony prominences with no grimacing/withdrawal or obvious ss of pain noted. Pt does have hematoma to left side of back of head.

## 2023-06-29 NOTE — ED Notes (Signed)
Attempted report and when transferred to nurses station no answer.

## 2023-07-02 NOTE — ED Provider Notes (Signed)
[place] EMERGENCY DEPARTMENT AT [facility] Provider Note   CSN: [id number] Arrival date & time: 06/29/23  1448     History  Chief Complaint  Patient presents with   Charlotte Henry is a 83 y.o. female.   Fall      Charlotte Henry is a 83 y.o. female who resides at assisted living facility with past medical history of hypertension, chronic kidney disease, cardiac pacemaker, anemia, dementia, COPD who presents to the emergency department for evaluation of ground-level fall that occurred earlier today.  She is accompanied by her granddaughter.  Family is requesting patient be evaluated.  Per granddaughter, patient is not supposed to be ambulating without assistance but got up to go to the restroom on her own.  Is believed that she fell striking the left side of her head on something.  Patient complains of a sore swollen area to the back of her scalp.  She does not take blood thinners.  Patient unable to provide clear details on the event.  She denies dizziness, vomiting neck or back pain.  Home Medications Prior to Admission medications   Medication Sig Start Date End Date Taking? Authorizing Provider  acetaminophen (TYLENOL) 325 MG tablet Take 2 tablets (650 mg total) by mouth every 6 (six) hours as needed for mild pain (or Fever >/= 101). 11/18/20   Shon Hale, MD  albuterol (VENTOLIN HFA) 108 (90 Base) MCG/ACT inhaler Inhale 2 puffs into the lungs every 4 (four) hours as needed for wheezing or shortness of breath. 12/02/20   Sharee Holster, NP  [facility] ADULT [facility] 11.3 % CREA cream Apply topically. 08/30/21   [provider]  bisacodyl (DULCOLAX) 10 MG suppository Place 1 suppository (10 mg total) rectally daily as needed for moderate constipation. 08/04/21   Vassie Loll, MD  calcium citrate-vitamin D (CITRACAL+D) 315-200 MG-UNIT per tablet Take 1 tablet by mouth daily.    [provider]  Cyanocobalamin (VITAMIN B 12 PO) Take 250 mcg by  mouth daily.    [provider]  donepezil (ARICEPT) 5 MG tablet Take 1 tablet (5 mg total) by mouth at bedtime. 12/02/20   Sharee Holster, NP  escitalopram (LEXAPRO) 10 MG tablet Take 1 tablet (10 mg total) by mouth daily. 12/02/20   Sharee Holster, NP  ezetimibe (ZETIA) 10 MG tablet Take 1 tablet (10 mg total) by mouth daily. 12/02/20   Sharee Holster, NP  fentaNYL (DURAGESIC) 12 MCG/HR SMARTSIG:1 Patch(s) T-DERMAL Every 3 Days 10/12/21   [provider]  ferrous sulfate 325 (65 FE) MG tablet Take 1 tablet (325 mg total) by mouth daily with breakfast. 12/02/20   Sharee Holster, NP  furosemide (LASIX) 20 MG tablet Take 1 tablet (20 mg total) by mouth daily as needed for fluid or edema. 08/03/21   Vassie Loll, MD  haloperidol (HALDOL) 2 MG tablet Take 2 mg by mouth daily. 10/12/21   [provider]  haloperidol (HALDOL) 5 MG tablet Take 5 mg by mouth daily. 10/12/21   [provider]  Lidocaine 4 % PTCH Apply topically in the morning and at bedtime. apply to skin in AM and remove in PM    [provider]  LORazepam (ATIVAN) 0.5 MG tablet Take 0.5 mg by mouth every 3 (three) hours as needed. 10/06/21   [provider]  Multiple Vitamin (MULTIVITAMIN WITH MINERALS) TABS tablet Take 1 tablet by mouth daily. 08/05/16   Johnson, Clanford L,  MD  omeprazole (PRILOSEC) 20 MG capsule Take 1 capsule (20 mg total) by mouth 2 (two) times daily before a meal. 08/03/21   Vassie Loll, MD  ondansetron (ZOFRAN) 4 MG tablet Take 4 mg by mouth every 6 (six) hours as needed. 10/06/21   [provider]  oxyCODONE (OXY IR/ROXICODONE) 5 MG immediate release tablet Take 1 tablet (5 mg total) by mouth every 6 (six) hours as needed for severe pain. 08/03/21   Vassie Loll, MD  senna-docusate (SENOKOT-S) 8.6-50 MG tablet Take 2 tablets by mouth 2 (two) times daily. 08/03/21   Vassie Loll, MD  traZODone (DESYREL) 50 MG tablet Take 0.5 tablets (25 mg total) by mouth 2  (two) times daily. 08/03/21   Vassie Loll, MD      Allergies    Ace inhibitors, Aspirin, Codeine, Pineapple, Plasticized base [plastibase], Ultram [tramadol], Adhesive [tape], Naprosyn [naproxen], Biaxin [clarithromycin], Clindamycin/lincomycin, Linzess [linaclotide], Metronidazole, Nsaids, Penicillins, Varenicline tartrate, and Xarelto [rivaroxaban]    Review of Systems   Review of Systems  Unable to perform ROS: Dementia    Physical Exam Updated Vital Signs BP (!) 121/50   Pulse 61   Temp 97.6 F (36.4 C) (Oral)   Resp 18   SpO2 100%  Physical Exam Vitals and nursing note reviewed.  Constitutional:      General: She is not in acute distress.    Appearance: Normal appearance.  HENT:     Head:     Comments: Tenderness with hematoma left parietal/occipital scalp.  No laceration.    Mouth/Throat:     Mouth: Mucous membranes are moist.  Eyes:     Extraocular Movements: Extraocular movements intact.     Conjunctiva/sclera: Conjunctivae normal.     Pupils: Pupils are equal, round, and reactive to light.  Cardiovascular:     Rate and Rhythm: Normal rate and regular rhythm.     Pulses: Normal pulses.  Pulmonary:     Effort: Pulmonary effort is normal.  Chest:     Chest wall: No tenderness.  Abdominal:     Palpations: Abdomen is soft.     Tenderness: There is no abdominal tenderness.  Musculoskeletal:        General: Normal range of motion.     Cervical back: Full passive range of motion without pain and normal range of motion.  Skin:    General: Skin is warm.     Capillary Refill: Capillary refill takes less than 2 seconds.  Neurological:     Mental Status: She is alert. Mental status is at baseline.     Sensory: No sensory deficit.     Motor: No weakness.     ED Results / Procedures / Treatments   Labs (all labs ordered are listed, but only abnormal results are displayed) Labs Reviewed  CBC WITH DIFFERENTIAL/PLATELET - Abnormal; Notable for the following  components:      Result Value   RBC 3.01 (*)    Hemoglobin 9.3 (*)    HCT 28.2 (*)    Monocytes Absolute 1.2 (*)    All other components within normal limits  COMPREHENSIVE METABOLIC PANEL - Abnormal; Notable for the following components:   Glucose, Bld 111 (*)    BUN 43 (*)    Creatinine, Ser 2.01 (*)    GFR, Estimated 24 (*)    All other components within normal limits    EKG None  Radiology CT CHEST ABDOMEN PELVIS WO CONTRAST Result Date: 06/29/2023 CLINICAL DATA:  Polytrauma, blunt.  Fall  EXAM: CT CHEST, ABDOMEN AND PELVIS WITHOUT CONTRAST TECHNIQUE: Multidetector CT imaging of the chest, abdomen and pelvis was performed following the standard protocol without IV contrast. RADIATION DOSE REDUCTION: This exam was performed according to the departmental dose-optimization program which includes automated exposure control, adjustment of the mA and/or kV according to patient size and/or use of iterative reconstruction technique. COMPARISON:  CT chest abdomen pelvis 02/13/2019, chest CT 10/25/2020 FINDINGS: CHEST: Cardiovascular: The thoracic aorta is normal in caliber. The heart is normal in size. No significant pericardial effusion. Four-vessel coronary artery calcification. Aortic valve leaflet calcification. Severe atherosclerotic plaque of the thoracic aorta and its main branches. Left chest wall dual lead pacemaker. Lungs/Pleura: At least moderate centrilobular emphysematous changes. Stable subpleural micronodule within the left upper lobe (5:25) -no further follow-up indicated. No focal consolidation. No pulmonary nodule. No pulmonary mass. No pulmonary contusion or laceration. No pneumatocele formation. No pleural effusion. No pneumothorax. No hemothorax. Mediastinum/Nodes: No pneumomediastinum. The central airways are patent. The esophagus is unremarkable. The thyroid is unremarkable. Limited evaluation for hilar lymphadenopathy on this noncontrast study. No mediastinal or axillary  lymphadenopathy. Musculoskeletal/Chest wall No chest wall mass. No acute rib or sternal fracture. No spinal fracture. ABDOMEN / PELVIS: Hepatobiliary: Not enlarged. 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 Status post cholecystectomy. No definite intra or extrahepatic biliary ductal dilatation. Pancreas: Normal pancreatic contour. No main pancreatic duct dilatation. Spleen: Not enlarged. No focal lesion. Adrenals/Urinary Tract: No nodularity bilaterally. No hydroureteronephrosis. No nephroureterolithiasis. Calcifications associated with the kidneys are likely vascular in etiology. Fluid density lesions within the kidneys including an interpolar exophytic lesion on the right likely represent simple renal cysts. Simple renal cysts, in the absence of clinically indicated signs/symptoms, require no independent follow-up. The urinary bladder is unremarkable. Stomach/Bowel: No small or large bowel wall thickening or dilatation. Colonic diverticulosis. The appendix is unremarkable. Vasculature/Lymphatic: Severe atherosclerotic plaque of the abdominal aorta and its main branches. Aneurysmal infrarenal abdominal aorta with increased caliber measuring up to 3.3 x 3.1 cm (from 3.2 x 2.9 cm). No abdominal aorta or iliac aneurysm. No abdominal, pelvic, inguinal lymphadenopathy. Reproductive: Status post hysterectomy.  No adnexal masses. Other: No simple free fluid ascites. No pneumoperitoneum. No mesenteric hematoma identified. No organized fluid collection. Musculoskeletal: Bilateral hip subcutaneus soft tissue hematoma. Diffusely decreased bone density. No acute pelvic fracture. No fixation of the right femoral neck. Please separately dictated CT lumbar spine 06/29/2023. Ports and Devices: None. IMPRESSION: 1. No acute intrathoracic, intra-abdominal, intrapelvic traumatic injury with limited evaluation on this noncontrast  study. 2. No acute fracture or traumatic malalignment of the thoracic spine. 3. Please separately dictated CT lumbar spine 06/29/2023. Other imaging findings of potential clinical significance: 1. Aneurysmal infrarenal abdominal aorta with increased caliber measuring up to 3.3 x 3.1 cm (from 3.2 x 2.9 cm). Recommend follow-up ultrasound every 3 years. (Ref.: J Vasc Surg. 2018; 67:2-77 and J Am Coll Radiol 2013;10(10):789-794. Aortic aneurysm NOS (ICD10-I71.9). 2. Aortic Atherosclerosis (ICD10-I70.0) -severe including four-vessel coronary artery and aortic valve leaflet calcifications-correlate for aortic stenosis. 3. Diffusely decreased bone density. 4. Colonic diverticulosis with no acute diverticulitis. 5. Status post hysterectomy and cholecystectomy. Electronically Signed   By: Tish Frederickson M.D.   On: 06/29/2023 17:42   CT L-SPINE NO CHARGE Result Date: 06/29/2023 CLINICAL DATA:  Fall EXAM: CT LUMBAR SPINE WITHOUT CONTRAST TECHNIQUE: Multidetector CT imaging of the lumbar spine was performed without intravenous contrast administration. Multiplanar CT image reconstructions  were also generated. RADIATION DOSE REDUCTION: This exam was performed according to the departmental dose-optimization program which includes automated exposure control, adjustment of the mA and/or kV according to patient size and/or use of iterative reconstruction technique. COMPARISON:  CT abdomen pelvis 06/29/2023 FINDINGS: Segmentation: 5 lumbar type vertebrae. Alignment: Grade 1 anterolisthesis of L4 on L5. Vertebrae: Multilevel mild-to-moderate degenerative change of the spine most prominent at the L4-L5 level. No associated severe osseous neural foraminal or central canal stenosis. No acute fracture or focal pathologic process. Paraspinal and other soft tissues: Negative. Disc levels: Intervertebral disc space vacuum phenomenon at the L4-L5 level. IMPRESSION: No acute displaced fracture or traumatic listhesis of the lumbar spine.  Electronically Signed   By: Tish Frederickson M.D.   On: 06/29/2023 17:16   CT Head Wo Contrast Result Date: 06/29/2023 CLINICAL DATA:  Head trauma, minor (Age >= 65y); Neck trauma (Age >= 65y). Fall EXAM: CT HEAD WITHOUT CONTRAST CT CERVICAL SPINE WITHOUT CONTRAST TECHNIQUE: Multidetector CT imaging of the head and cervical spine was performed following the standard protocol without intravenous contrast. Multiplanar CT image reconstructions of the cervical spine were also generated. RADIATION DOSE REDUCTION: This exam was performed according to the departmental dose-optimization program which includes automated exposure control, adjustment of the mA and/or kV according to patient size and/or use of iterative reconstruction technique. COMPARISON:  CT head 10/12/2021 FINDINGS: CT HEAD FINDINGS Brain: Patchy and confluent areas of decreased attenuation are noted throughout the deep and periventricular white matter of the cerebral hemispheres bilaterally, compatible with chronic microvascular ischemic disease. No evidence of large-territorial acute infarction. No parenchymal hemorrhage. No mass lesion. No extra-axial collection. No mass effect or midline shift. No hydrocephalus. Basilar cisterns are patent. Vascular: No hyperdense vessel. Atherosclerotic calcifications are present within the cavernous internal carotid and vertebral arteries. Skull: No acute fracture or focal lesion. Sinuses/Orbits: Polypoid like mucosal thickening of the left maxillary sinus. Paranasal sinuses and mastoid air cells are clear. Bilateral lens replacement. Otherwise the orbits are unremarkable. Other: Left posterior scalp 20 mm hematoma. CT CERVICAL SPINE FINDINGS Alignment: Normal. Skull base and vertebrae: Multilevel severe degenerative changes of the spine with bulky anterior osteophyte formation. No severe osseous foraminal or central canal stenosis. No acute fracture. No aggressive appearing focal osseous lesion or focal pathologic  process. Soft tissues and spinal canal: No prevertebral fluid or swelling. No visible canal hematoma. Upper chest: Emphysematous changes. Other: Atherosclerotic plaque of the carotid arteries within the neck. IMPRESSION: 1. No acute intracranial abnormality. 2. No acute displaced fracture or traumatic listhesis of the cervical spine. 3. Left posterior scalp 20 mm hematoma. 4.  Emphysema (ICD10-J43.9). Electronically Signed   By: Tish Frederickson M.D.   On: 06/29/2023 17:14   CT Cervical Spine Wo Contrast Result Date: 06/29/2023 CLINICAL DATA:  Head trauma, minor (Age >= 65y); Neck trauma (Age >= 65y). Fall EXAM: CT HEAD WITHOUT CONTRAST CT CERVICAL SPINE WITHOUT CONTRAST TECHNIQUE: Multidetector CT imaging of the head and cervical spine was performed following the standard protocol without intravenous contrast. Multiplanar CT image reconstructions of the cervical spine were also generated. RADIATION DOSE REDUCTION: This exam was performed according to the departmental dose-optimization program which includes automated exposure control, adjustment of the mA and/or kV according to patient size and/or use of iterative reconstruction technique. COMPARISON:  CT head 10/12/2021 FINDINGS: CT HEAD FINDINGS Brain: Patchy and confluent areas of decreased attenuation are noted throughout the deep and periventricular white matter of the cerebral hemispheres bilaterally, compatible with chronic microvascular ischemic  disease. No evidence of large-territorial acute infarction. No parenchymal hemorrhage. No mass lesion. No extra-axial collection. No mass effect or midline shift. No hydrocephalus. Basilar cisterns are patent. Vascular: No hyperdense vessel. Atherosclerotic calcifications are present within the cavernous internal carotid and vertebral arteries. Skull: No acute fracture or focal lesion. Sinuses/Orbits: Polypoid like mucosal thickening of the left maxillary sinus. Paranasal sinuses and mastoid air cells are clear.  Bilateral lens replacement. Otherwise the orbits are unremarkable. Other: Left posterior scalp 20 mm hematoma. CT CERVICAL SPINE FINDINGS Alignment: Normal. Skull base and vertebrae: Multilevel severe degenerative changes of the spine with bulky anterior osteophyte formation. No severe osseous foraminal or central canal stenosis. No acute fracture. No aggressive appearing focal osseous lesion or focal pathologic process. Soft tissues and spinal canal: No prevertebral fluid or swelling. No visible canal hematoma. Upper chest: Emphysematous changes. Other: Atherosclerotic plaque of the carotid arteries within the neck. IMPRESSION: 1. No acute intracranial abnormality. 2. No acute displaced fracture or traumatic listhesis of the cervical spine. 3. Left posterior scalp 20 mm hematoma. 4.  Emphysema (ICD10-J43.9). Electronically Signed   By: Tish Frederickson M.D.   On: 06/29/2023 17:14     Procedures Procedures    Medications Ordered in ED Medications - No data to display  ED Course/ Medical Decision Making/ A&P                                 Medical Decision Making Patient here from assisted living facility for evaluation after injury sustained in a fall.  Patient has history of dementia at baseline unable to provide clear details about the fall but family member at bedside states that she is not supposed to be ambulating without assistance but got up to go to the restroom on her own.  She has tenderness and hematoma to the left scalp.  No open wounds seen on my exam.  Answering questions appropriately.  Moving all extremities well.  Likely scalp contusion.  Subdural hematoma intracranial bleeding skull fracture cervical spine injury all also considered  Amount and/or Complexity of Data Reviewed Labs: ordered.    Details: Labs without certain significant derangement Radiology: ordered.    Details: CT chest abdomen and pelvis without acute intrathoracic intra-abdominal or intrapelvic injury CT  L-spine shows no acute traumatic listhesis or fracture CT head and CT C-spine no acute intracranial injury or no displaced fracture of the cervical spine Discussion of management or test interpretation with external provider(s): On recheck, patient appears to be mentating well.  Has been observed in the department without complication.  Family at bedside.  Discussed CT finding of aneurysmal infrarenal abdominal aorta at 3.3 cm.  This was 3.2 cm in 2020.  Family agreeable to close outpatient follow-up of this.  Patient appears appropriate for discharge back to facility.  Return precautions were given.  Recommended Tylenol if needed for discomfort.           Final Clinical Impression(s) / ED Diagnoses Final diagnoses:  Fall, initial encounter  Injury of head, initial encounter    Rx / DC Orders ED Discharge Orders     None         Pauline Aus, PA-C 07/02/23 1555    Bethann Berkshire, MD 07/05/23 1018

## 2023-11-23 ENCOUNTER — Encounter (HOSPITAL_COMMUNITY): Payer: Self-pay | Admitting: Hematology

## 2024-02-13 ENCOUNTER — Ambulatory Visit: Payer: Medicare (Managed Care) | Attending: Internal Medicine | Admitting: Internal Medicine

## 2024-02-13 VITALS — BP 108/60 | HR 60 | Ht 65.0 in

## 2024-02-13 DIAGNOSIS — I2510 Atherosclerotic heart disease of native coronary artery without angina pectoris: Secondary | ICD-10-CM | POA: Diagnosis not present

## 2024-02-13 DIAGNOSIS — I442 Atrioventricular block, complete: Secondary | ICD-10-CM

## 2024-02-13 DIAGNOSIS — I10 Essential (primary) hypertension: Secondary | ICD-10-CM | POA: Diagnosis not present

## 2024-02-13 LAB — CUP PACEART INCLINIC DEVICE CHECK
Battery Remaining Longevity: 14 mo
Battery Voltage: 2.86 V
Brady Statistic RA Percent Paced: 74 %
Brady Statistic RV Percent Paced: 95 %
Date Time Interrogation Session: 20250910122546
Implantable Lead Connection Status: 753985
Implantable Lead Connection Status: 753985
Implantable Lead Implant Date: 20171020
Implantable Lead Implant Date: 20171020
Implantable Lead Location: 753859
Implantable Lead Location: 753860
Implantable Pulse Generator Implant Date: 20171020
Lead Channel Impedance Value: 375 Ohm
Lead Channel Impedance Value: 412.5 Ohm
Lead Channel Pacing Threshold Amplitude: 0.5 V
Lead Channel Pacing Threshold Amplitude: 0.5 V
Lead Channel Pacing Threshold Amplitude: 1.25 V
Lead Channel Pacing Threshold Amplitude: 1.25 V
Lead Channel Pacing Threshold Pulse Width: 0.4 ms
Lead Channel Pacing Threshold Pulse Width: 0.4 ms
Lead Channel Pacing Threshold Pulse Width: 0.4 ms
Lead Channel Pacing Threshold Pulse Width: 0.4 ms
Lead Channel Sensing Intrinsic Amplitude: 1.6 mV
Lead Channel Sensing Intrinsic Amplitude: 6.8 mV
Lead Channel Setting Pacing Amplitude: 2 V
Lead Channel Setting Pacing Amplitude: 2.5 V
Lead Channel Setting Pacing Pulse Width: 0.4 ms
Lead Channel Setting Sensing Sensitivity: 3.5 mV
Pulse Gen Model: 2272
Pulse Gen Serial Number: 3180497

## 2024-02-13 NOTE — Progress Notes (Signed)
 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 She has been stable in the interim, with no chest pain or shortness of breath.  She denies anginal symptoms. She has not had syncope. She notes peripheral edema. She stopped smoking since her last visit. She has developed some dementia and is on Aricept .  Allergies  Allergen Reactions   Ace Inhibitors Hives, Shortness Of Breath and Swelling   Aspirin  Other (See Comments)    Blood in stool and nose bleed   Codeine Swelling   Pineapple Shortness Of Breath and Swelling   Plasticized Base [Plastibase] Hives and Swelling    No plastics   Ultram  [Tramadol ] Swelling   Adhesive [Tape] Swelling and Rash   Naprosyn [Naproxen] Swelling   Biaxin  [Clarithromycin ] Nausea And Vomiting and Other (See Comments)    Dizziness.   Clindamycin/Lincomycin Other (See Comments)    Burning sensation throat, abdomen   Linzess  [Linaclotide ]     DIARRHEA, FECAL INCONTINENCE   Metronidazole      NAUSEA AND WEAKNESS   Nsaids    Penicillins     Lip swelling, Hives Has patient had a PCN reaction causing immediate rash, facial/tongue/throat swelling, SOB or lightheadedness with hypotension:YES Has patient had a PCN reaction causing severe rash involving mucus membranes or skin necrosis: NO Has patient had a PCN reaction that required hospitalization NO Has patient had a PCN reaction occurring within the last 10 years: NO If all of the above answers are NO, then may proceed with Cephalosporin use.   Varenicline Tartrate Swelling   Xarelto  [Rivaroxaban ]     Bleeding     Current Outpatient Medications  Medication Sig Dispense Refill   acetaminophen  (TYLENOL ) 325 MG tablet Take 2 tablets (650 mg total) by mouth every 6 (six) hours as needed for mild pain (or Fever >/= 101). 12 tablet 0   albuterol  (VENTOLIN  HFA) 108 (90 Base) MCG/ACT inhaler Inhale 2 puffs into the lungs every 4  (four) hours as needed for wheezing or shortness of breath. 1 each 0   BALMEX ADULT CARE 11.3 % CREA cream Apply topically.     bisacodyl  (DULCOLAX) 10 MG suppository Place 1 suppository (10 mg total) rectally daily as needed for moderate constipation.     calcium  citrate-vitamin D  (CITRACAL+D) 315-200 MG-UNIT per tablet Take 1 tablet by mouth daily.     Cyanocobalamin  (VITAMIN B 12 PO) Take 250 mcg by mouth daily.     donepezil  (ARICEPT ) 5 MG tablet Take 1 tablet (5 mg total) by mouth at bedtime. 30 tablet 0   escitalopram  (LEXAPRO ) 10 MG tablet Take 1 tablet (10 mg total) by mouth daily. 30 tablet 0   ezetimibe  (ZETIA ) 10 MG tablet Take 1 tablet (10 mg total) by mouth daily. 30 tablet 0   fentaNYL  (DURAGESIC ) 12 MCG/HR SMARTSIG:1 Patch(s) T-DERMAL Every 3 Days     ferrous sulfate  325 (65 FE) MG tablet Take 1 tablet (325 mg total) by mouth daily with breakfast. 30 tablet 0   furosemide  (LASIX ) 20 MG tablet Take 1 tablet (20 mg total) by mouth daily as needed for fluid or edema.     haloperidol (HALDOL) 2 MG tablet Take 2 mg by mouth daily.     haloperidol (HALDOL) 5 MG tablet Take 5 mg by mouth daily.     Lidocaine  4 % PTCH Apply topically in the morning and at bedtime. apply to skin in AM and remove in  PM     LORazepam (ATIVAN) 0.5 MG tablet Take 0.5 mg by mouth every 3 (three) hours as needed.     Multiple Vitamin (MULTIVITAMIN WITH MINERALS) TABS tablet Take 1 tablet by mouth daily.     omeprazole  (PRILOSEC) 20 MG capsule Take 1 capsule (20 mg total) by mouth 2 (two) times daily before a meal.     ondansetron  (ZOFRAN ) 4 MG tablet Take 4 mg by mouth every 6 (six) hours as needed.     oxyCODONE  (OXY IR/ROXICODONE ) 5 MG immediate release tablet Take 1 tablet (5 mg total) by mouth every 6 (six) hours as needed for severe pain. 20 tablet 0   senna-docusate (SENOKOT-S) 8.6-50 MG tablet Take 2 tablets by mouth 2 (two) times daily.     traZODone  (DESYREL ) 50 MG tablet Take 0.5 tablets (25 mg total)  by mouth 2 (two) times daily.     No current facility-administered medications for this visit.     Past Medical History:  Diagnosis Date   Acute lower UTI 11/15/2020   Arterial fibromuscular dysplasia (HCC)    ASCVD (arteriosclerotic cardiovascular disease)    a. cath in 4/04,-80%LAD; 70% RCA, -> DESx2; residual 60% distal RCA; nl EF  b. Nuc 02/2016 aborted due to bradycardia. Will need to be rescheduled    Carotid artery occlusion    Cerebrovascular disease    CHB (complete heart block) (HCC)    a. s/p PPM on 03/24/16 with a St. Jude (serial number [id number]) pacemaker   Chronic kidney disease (CKD), stage III (moderate) (HCC)    Clostridium difficile colitis 03/2013   a. 03/2013   COPD (chronic obstructive pulmonary disease) (HCC)    Coronary disease    Dementia (HCC)    Diverticulosis    GERD (gastroesophageal reflux disease)    Gout    H pylori ulcer    Hyperlipidemia    chose to stop statin, takes Zetia    Hypertension    not currently on medication   IBS (irritable bowel syndrome)    PVD (peripheral vascular disease) (HCC)    a. moderate to severely decreased ABI's in 8/08, sig. aortic inflow disease b. repeat ABI's in 2011 improved- mild disease on the left and moderate to severe disease on the right   S/P placement of cardiac pacemaker    a. 03/24/16: St. Jude (serial number [id number]) pacemaker   Symptomatic anemia 08/15/2016   TIA (transient ischemic attack) 2012   Tobacco abuse    a. 50 pack years continuing at 1/2 pack per day   Tobacco use disorder 10/15/2015    ROS:   All systems reviewed and negative except as noted in the HPI.   Past Surgical History:  Procedure Laterality Date   ABDOMINAL HYSTERECTOMY     carpel tunnel release     bilateral   CHOLECYSTECTOMY     COLONOSCOPY  2009   COLONOSCOPY N/A 08/04/2016   incomplete due to prep.    COLONOSCOPY N/A 08/05/2016   Dr. Harvey: moderately redundant rectosigmoid and sigmoid, normal TI, single  large-mouthed diverticulum in hepatic flexure, external hemorrhoids, path negative for microscopic colitis    ENTEROSCOPY N/A 08/18/2016   Procedure: ENTEROSCOPY;  Surgeon: Lamar CHRISTELLA Hollingshead, MD;  Location: [facility];  Service: Endoscopy;  Laterality: N/A;  Pediatric colonoscope   EP IMPLANTABLE DEVICE N/A 03/24/2016   Procedure: Pacemaker Implant;  Surgeon: Charlotte LELON Birmingham, MD;  Location: [facility] INVASIVE CV LAB;  Service: Cardiovascular;  Laterality: N/A;   ESOPHAGOGASTRODUODENOSCOPY N/A 08/04/2016  Dr. Harvey: non-critical Schatzki's ring, small hiatal hernia, +H.pylori gastritis on path.    GIVENS CAPSULE STUDY N/A 08/16/2016   Procedure: GIVENS CAPSULE STUDY;  Surgeon: Lamar CHRISTELLA Hollingshead, MD;  Location: [facility];  Service: Endoscopy;  Laterality: N/A;   HIP PINNING,CANNULATED Right 08/02/2021   Procedure: CANNULATED HIP PINNING;  Surgeon: Onesimo Oneil LABOR, MD;  Location: [facility];  Service: Orthopedics;  Laterality: Right;   TOTAL ABDOMINAL HYSTERECTOMY W/ BILATERAL SALPINGOOPHORECTOMY  1979     Family History  Problem Relation Age of Onset   Liver disease Mother 38   Hypertension Mother    Cerebral aneurysm Father    Aneurysm Father 98   Cancer Sister    Liver disease Brother    Liver disease Brother    Liver disease Brother    Diabetes type II Brother    Alcohol abuse Brother    Colon cancer Neg Hx      Social History   Socioeconomic History   Marital status: Widowed    Spouse name: Not on file   Number of children: Not on file   Years of education: Not on file   Highest education level: Not on file  Occupational History   Not on file  Tobacco Use   Smoking status: Every Day    Current packs/day: 1.00    Average packs/day: 1 pack/day for 58.0 years (58.0 ttl pk-yrs)    Types: Cigarettes    Start date: 02/02/1966   Smokeless tobacco: Never   Tobacco comments:    1/2 pack daily  Vaping Use   Vaping status: Never Used  Substance and Sexual Activity   Alcohol use: No     Alcohol/week: 0.0 standard drinks of alcohol    Comment: H/O of 6 pack per day quiting in 2003   Drug use: No   Sexual activity: Not Currently  Other Topics Concern   Not on file  Social History Narrative   Not on file   Social Drivers of Health   Financial Resource Strain: Not on file  Food Insecurity: Not on file  Transportation Needs: Not on file  Physical Activity: Not on file  Stress: Not on file  Social Connections: Not on file  Intimate Partner Violence: Unknown (03/12/2018)   Humiliation, Afraid, Rape, and Kick questionnaire    Fear of Current or Ex-Partner: No    Emotionally Abused: No    Physically Abused: Not on file    Sexually Abused: No     BP 108/60 (BP Location: Right Arm, Patient Position: Sitting, Cuff Size: Normal)   Pulse 60   Ht 5' 5 (1.651 m)   SpO2 96%   BMI 19.97 kg/m   Physical Exam:  Well appearing 83 yo woman, NAD HEENT: Unremarkable Neck:  No JVD, no thyromegally Lymphatics:  No adenopathy Back:  No CVA tenderness Lungs:  Clear HEART:  Regular rate rhythm, no murmurs, no rubs, no clicks Abd:  soft, positive bowel sounds, no organomegally, no rebound, no guarding Ext:  2 plus pulses, no edema, no cyanosis, no clubbing Skin:  No rashes no nodules Neuro:  CN II through XII intact, motor grossly intact  EKG - nsr with ventricular pacing and PVC's  DEVICE  Normal device function.  See PaceArt for details.   Assess/Plan:  1.Sinus node dysfunction - she is asymptomatic, s/p PPM insertion. She has heart block today with an escape of 33/min 2. Tobacco abuse - She has stopped smoking.  3. HTN - her bp is  well controlled today. 4. Peripheral edema -resolved. We will follow.  Charlotte Henry.D.

## 2024-02-13 NOTE — Patient Instructions (Signed)

## 2024-02-15 ENCOUNTER — Telehealth: Payer: Self-pay | Admitting: Internal Medicine

## 2024-02-15 NOTE — Telephone Encounter (Signed)
 Attempted to contact [facility] for [facility] & Re[facility],  Patient seen in office on 02/13/24 with Dr. Waddell to re-establish care for her PPM. She was previously on remote monitoring, but is currently living in [facility] and no transmissions have been received since 02/05/22.  The patient was not accompanied by any family or representative from the nursing facility at her recent office visit.  Charlotte Henry did not recall have a transmitter at her bedside.   I was unable to speak with the nurse caring for Charlotte Henry to inquire is she has a transmitter in her room or if we need to order a new one to be shipped to the facility.  I left a message with the phone operator, Dena, to please have the patient's nurse call us  back to discuss.

## 2024-02-18 NOTE — Telephone Encounter (Signed)
 Called [facility] and was able to speak with the patient's nurse, [place]. Inquired if she was aware of a Pensions consultant that may have accompanied the patient to their facility.  Per [place], the patient does not have a transmitter.  I advised we would like to send the patient another transmitter to the facility.  [place] stated she wanted to follow up with the patient's family to confirm if they know where the previous transmitter might be prior to us  ordering an additional monitor. Advised Sierra to please call us  back at the [facility] once she is able to speak with Ms. Engelbrecht's family- [facility] number provided.  [place] voices understanding and is agreeable.

## 2024-02-26 NOTE — Telephone Encounter (Signed)
 I spoke with the patient's nurse at her facility today to see if the patient's family had been contacted regarding a transmitter box for her PPM.  Per nursing, there is no notation of this in the patient's chart.  Inquired if I could order a new transmitter to be shipped to the patient at [facility]- ok per nursing staff.   Will ask Device CMA to order a monitor for the patient to be shipped to:  [facility] [address]  [place], [place] [address]

## 2024-02-27 NOTE — Telephone Encounter (Signed)
 Called pt daughter Charlotte Henry and LVM for her to call us  back. I am unable to order a new monitor because she has an active one now so if its not at the facility it needs to be unplugged from where it is to where she is now

## 2024-05-10 ENCOUNTER — Encounter (HOSPITAL_COMMUNITY): Payer: Self-pay | Admitting: Oncology
